# Patient Record
Sex: Female | Born: 1945 | Race: Black or African American | Hispanic: No | Smoking: Former smoker
Health system: Southern US, Community
[De-identification: ages and names within clinical notes are randomized; demographics above are authoritative.]

## PROBLEM LIST (undated history)

## (undated) DIAGNOSIS — I10 Essential (primary) hypertension: Secondary | ICD-10-CM

## (undated) DIAGNOSIS — E538 Deficiency of other specified B group vitamins: Secondary | ICD-10-CM

## (undated) DIAGNOSIS — E785 Hyperlipidemia, unspecified: Secondary | ICD-10-CM

## (undated) DIAGNOSIS — G709 Myoneural disorder, unspecified: Secondary | ICD-10-CM

## (undated) DIAGNOSIS — T7840XA Allergy, unspecified, initial encounter: Secondary | ICD-10-CM

## (undated) DIAGNOSIS — M1990 Unspecified osteoarthritis, unspecified site: Secondary | ICD-10-CM

## (undated) HISTORY — DX: Unspecified osteoarthritis, unspecified site: M19.90

## (undated) HISTORY — DX: Myoneural disorder, unspecified: G70.9

## (undated) HISTORY — DX: Allergy, unspecified, initial encounter: T78.40XA

## (undated) HISTORY — DX: Hyperlipidemia, unspecified: E78.5

## (undated) HISTORY — DX: Deficiency of other specified B group vitamins: E53.8

---

## 2002-07-12 ENCOUNTER — Emergency Department (HOSPITAL_COMMUNITY): Admission: EM | Admit: 2002-07-12 | Discharge: 2002-07-12 | Payer: Self-pay | Admitting: Emergency Medicine

## 2002-07-12 ENCOUNTER — Encounter: Payer: Self-pay | Admitting: Emergency Medicine

## 2004-11-04 ENCOUNTER — Ambulatory Visit: Payer: Self-pay | Admitting: Internal Medicine

## 2004-11-27 ENCOUNTER — Emergency Department: Payer: Self-pay | Admitting: Emergency Medicine

## 2004-12-23 ENCOUNTER — Ambulatory Visit: Payer: Self-pay | Admitting: Internal Medicine

## 2005-01-16 ENCOUNTER — Ambulatory Visit: Payer: Self-pay | Admitting: Internal Medicine

## 2005-02-23 ENCOUNTER — Ambulatory Visit: Payer: Self-pay | Admitting: Internal Medicine

## 2005-08-01 ENCOUNTER — Other Ambulatory Visit: Admission: RE | Admit: 2005-08-01 | Discharge: 2005-08-01 | Payer: Self-pay | Admitting: Internal Medicine

## 2005-08-01 ENCOUNTER — Ambulatory Visit: Payer: Self-pay | Admitting: Internal Medicine

## 2005-08-21 ENCOUNTER — Ambulatory Visit: Payer: Self-pay | Admitting: Internal Medicine

## 2005-09-19 ENCOUNTER — Ambulatory Visit: Payer: Self-pay | Admitting: Internal Medicine

## 2006-01-17 ENCOUNTER — Ambulatory Visit: Payer: Self-pay | Admitting: Family Medicine

## 2006-02-01 ENCOUNTER — Ambulatory Visit: Payer: Self-pay | Admitting: Internal Medicine

## 2006-05-30 ENCOUNTER — Ambulatory Visit: Payer: Self-pay | Admitting: Internal Medicine

## 2006-08-08 ENCOUNTER — Ambulatory Visit: Payer: Self-pay | Admitting: Family Medicine

## 2006-08-31 ENCOUNTER — Other Ambulatory Visit: Admission: RE | Admit: 2006-08-31 | Discharge: 2006-08-31 | Payer: Self-pay | Admitting: Internal Medicine

## 2006-08-31 ENCOUNTER — Ambulatory Visit: Payer: Self-pay | Admitting: Internal Medicine

## 2006-08-31 LAB — CONVERTED CEMR LAB: Pap Smear: NORMAL

## 2006-10-02 ENCOUNTER — Ambulatory Visit: Payer: Self-pay | Admitting: Internal Medicine

## 2006-12-21 ENCOUNTER — Encounter: Payer: Self-pay | Admitting: Internal Medicine

## 2006-12-21 ENCOUNTER — Ambulatory Visit: Payer: Self-pay | Admitting: Gastroenterology

## 2007-03-01 ENCOUNTER — Ambulatory Visit: Payer: Self-pay | Admitting: Internal Medicine

## 2007-03-01 LAB — CONVERTED CEMR LAB
Albumin: 4.2 g/dL (ref 3.5–5.2)
BUN: 18 mg/dL (ref 6–23)
CO2: 26 meq/L (ref 19–32)
Calcium: 9.7 mg/dL (ref 8.4–10.5)
Chloride: 106 meq/L (ref 96–112)
Creatinine, Ser: 0.81 mg/dL (ref 0.40–1.20)
Glucose, Bld: 114 mg/dL — ABNORMAL HIGH (ref 70–99)
Hgb A1c MFr Bld: 7.7 % — ABNORMAL HIGH (ref 4.6–6.1)
Phosphorus: 4 mg/dL (ref 2.3–4.6)
Potassium: 4.1 meq/L (ref 3.5–5.3)
Sodium: 140 meq/L (ref 135–145)

## 2007-07-25 ENCOUNTER — Encounter: Payer: Self-pay | Admitting: Internal Medicine

## 2007-08-22 ENCOUNTER — Encounter: Payer: Self-pay | Admitting: Internal Medicine

## 2007-08-22 DIAGNOSIS — E1140 Type 2 diabetes mellitus with diabetic neuropathy, unspecified: Secondary | ICD-10-CM | POA: Insufficient documentation

## 2007-08-22 DIAGNOSIS — J45909 Unspecified asthma, uncomplicated: Secondary | ICD-10-CM

## 2007-08-22 DIAGNOSIS — J309 Allergic rhinitis, unspecified: Secondary | ICD-10-CM | POA: Insufficient documentation

## 2007-09-04 ENCOUNTER — Ambulatory Visit: Payer: Self-pay | Admitting: Internal Medicine

## 2007-09-05 LAB — CONVERTED CEMR LAB
Albumin: 3.6 g/dL (ref 3.5–5.2)
BUN: 14 mg/dL (ref 6–23)
CO2: 29 meq/L (ref 19–32)
Calcium: 9.4 mg/dL (ref 8.4–10.5)
Chloride: 109 meq/L (ref 96–112)
Cholesterol: 170 mg/dL (ref 0–200)
Creatinine, Ser: 0.7 mg/dL (ref 0.4–1.2)
Creatinine,U: 110.2 mg/dL
GFR calc Af Amer: 110 mL/min
GFR calc non Af Amer: 91 mL/min
Glucose, Bld: 114 mg/dL — ABNORMAL HIGH (ref 70–99)
HDL: 45.5 mg/dL (ref >39.0)
Hgb A1c MFr Bld: 6.4 % — ABNORMAL HIGH (ref 4.6–6.0)
LDL Cholesterol: 112 mg/dL — ABNORMAL HIGH (ref 0–99)
Microalb Creat Ratio: 5.4 mg/g (ref 0.0–30.0)
Microalb, Ur: 0.6 mg/dL (ref 0.0–1.9)
Phosphorus: 3.6 mg/dL (ref 2.3–4.6)
Potassium: 4.1 meq/L (ref 3.5–5.1)
Sodium: 143 meq/L (ref 135–145)
Total CHOL/HDL Ratio: 3.7
Triglycerides: 64 mg/dL (ref 0–149)
VLDL: 13 mg/dL (ref 0–40)

## 2007-10-09 ENCOUNTER — Encounter: Payer: Self-pay | Admitting: Internal Medicine

## 2007-10-09 ENCOUNTER — Ambulatory Visit: Payer: Self-pay | Admitting: Internal Medicine

## 2007-10-10 ENCOUNTER — Ambulatory Visit: Payer: Self-pay | Admitting: Internal Medicine

## 2007-10-10 DIAGNOSIS — J069 Acute upper respiratory infection, unspecified: Secondary | ICD-10-CM | POA: Insufficient documentation

## 2007-10-11 ENCOUNTER — Telehealth (INDEPENDENT_AMBULATORY_CARE_PROVIDER_SITE_OTHER): Payer: Self-pay | Admitting: *Deleted

## 2007-10-14 ENCOUNTER — Encounter (INDEPENDENT_AMBULATORY_CARE_PROVIDER_SITE_OTHER): Payer: Self-pay | Admitting: *Deleted

## 2007-10-17 ENCOUNTER — Emergency Department: Payer: Self-pay | Admitting: Emergency Medicine

## 2007-10-17 ENCOUNTER — Telehealth (INDEPENDENT_AMBULATORY_CARE_PROVIDER_SITE_OTHER): Payer: Self-pay | Admitting: *Deleted

## 2007-10-25 ENCOUNTER — Telehealth (INDEPENDENT_AMBULATORY_CARE_PROVIDER_SITE_OTHER): Payer: Self-pay | Admitting: *Deleted

## 2007-10-28 ENCOUNTER — Ambulatory Visit: Payer: Self-pay | Admitting: Internal Medicine

## 2007-10-28 DIAGNOSIS — G500 Trigeminal neuralgia: Secondary | ICD-10-CM | POA: Insufficient documentation

## 2007-12-09 ENCOUNTER — Telehealth (INDEPENDENT_AMBULATORY_CARE_PROVIDER_SITE_OTHER): Payer: Self-pay | Admitting: *Deleted

## 2008-02-19 ENCOUNTER — Ambulatory Visit: Payer: Self-pay | Admitting: Family Medicine

## 2008-02-21 ENCOUNTER — Telehealth: Payer: Self-pay | Admitting: Family Medicine

## 2008-02-25 ENCOUNTER — Ambulatory Visit: Payer: Self-pay | Admitting: Family Medicine

## 2008-02-25 DIAGNOSIS — H538 Other visual disturbances: Secondary | ICD-10-CM

## 2008-02-28 ENCOUNTER — Other Ambulatory Visit: Payer: Self-pay

## 2008-02-28 ENCOUNTER — Emergency Department: Payer: Self-pay | Admitting: Emergency Medicine

## 2008-03-04 ENCOUNTER — Ambulatory Visit: Payer: Self-pay | Admitting: Internal Medicine

## 2008-03-04 DIAGNOSIS — M159 Polyosteoarthritis, unspecified: Secondary | ICD-10-CM

## 2008-03-05 LAB — CONVERTED CEMR LAB
ALT: 18 units/L (ref 0–35)
AST: 18 units/L (ref 0–37)
Albumin: 3.9 g/dL (ref 3.5–5.2)
Alkaline Phosphatase: 70 units/L (ref 39–117)
BUN: 15 mg/dL (ref 6–23)
Basophils Absolute: 0.1 10*3/uL (ref 0.0–0.1)
Basophils Relative: 1.4 % — ABNORMAL HIGH (ref 0.0–1.0)
Bilirubin, Direct: 0.1 mg/dL (ref 0.0–0.3)
CO2: 30 meq/L (ref 19–32)
Calcium: 9.6 mg/dL (ref 8.4–10.5)
Chloride: 109 meq/L (ref 96–112)
Creatinine, Ser: 0.8 mg/dL (ref 0.4–1.2)
Eosinophils Absolute: 0.6 10*3/uL (ref 0.0–0.6)
Eosinophils Relative: 10.3 % — ABNORMAL HIGH (ref 0.0–5.0)
GFR calc Af Amer: 94 mL/min
GFR calc non Af Amer: 78 mL/min
Glucose, Bld: 115 mg/dL — ABNORMAL HIGH (ref 70–99)
HCT: 39.1 % (ref 36.0–46.0)
Hemoglobin: 12.5 g/dL (ref 12.0–15.0)
Hgb A1c MFr Bld: 6.5 % — ABNORMAL HIGH (ref 4.6–6.0)
Lymphocytes Relative: 30 % (ref 12.0–46.0)
MCHC: 32.1 g/dL (ref 30.0–36.0)
MCV: 85.1 fL (ref 78.0–100.0)
Monocytes Absolute: 0.3 10*3/uL (ref 0.2–0.7)
Monocytes Relative: 4.8 % (ref 3.0–11.0)
Neutro Abs: 3.4 10*3/uL (ref 1.4–7.7)
Neutrophils Relative %: 53.5 % (ref 43.0–77.0)
Phosphorus: 3.7 mg/dL (ref 2.3–4.6)
Platelets: 369 10*3/uL (ref 150–400)
Potassium: 4.5 meq/L (ref 3.5–5.1)
RBC: 4.6 M/uL (ref 3.87–5.11)
RDW: 12.7 % (ref 11.5–14.6)
Sed Rate: 29 mm/hr — ABNORMAL HIGH (ref 0–25)
Sodium: 143 meq/L (ref 135–145)
TSH: 1.74 microintl units/mL (ref 0.35–5.50)
Total Bilirubin: 0.7 mg/dL (ref 0.3–1.2)
Total Protein: 7.4 g/dL (ref 6.0–8.3)
WBC: 6.3 10*3/uL (ref 4.5–10.5)

## 2008-03-12 ENCOUNTER — Telehealth (INDEPENDENT_AMBULATORY_CARE_PROVIDER_SITE_OTHER): Payer: Self-pay | Admitting: *Deleted

## 2008-03-14 ENCOUNTER — Emergency Department (HOSPITAL_COMMUNITY): Admission: EM | Admit: 2008-03-14 | Discharge: 2008-03-14 | Payer: Self-pay | Admitting: Emergency Medicine

## 2008-03-23 ENCOUNTER — Telehealth (INDEPENDENT_AMBULATORY_CARE_PROVIDER_SITE_OTHER): Payer: Self-pay | Admitting: *Deleted

## 2008-06-23 ENCOUNTER — Telehealth (INDEPENDENT_AMBULATORY_CARE_PROVIDER_SITE_OTHER): Payer: Self-pay | Admitting: *Deleted

## 2008-07-22 ENCOUNTER — Telehealth: Payer: Self-pay | Admitting: Internal Medicine

## 2008-07-24 ENCOUNTER — Ambulatory Visit: Payer: Self-pay | Admitting: Internal Medicine

## 2008-07-27 LAB — CONVERTED CEMR LAB
Albumin: 3.6 g/dL (ref 3.5–5.2)
BUN: 17 mg/dL (ref 6–23)
CO2: 28 meq/L (ref 19–32)
Calcium: 9.3 mg/dL (ref 8.4–10.5)
Chloride: 106 meq/L (ref 96–112)
Creatinine, Ser: 0.8 mg/dL (ref 0.4–1.2)
Creatinine,U: 134.5 mg/dL
GFR calc Af Amer: 94 mL/min
GFR calc non Af Amer: 78 mL/min
Glucose, Bld: 111 mg/dL — ABNORMAL HIGH (ref 70–99)
Hgb A1c MFr Bld: 6.5 % — ABNORMAL HIGH (ref 4.6–6.0)
Microalb Creat Ratio: 8.9 mg/g (ref 0.0–30.0)
Microalb, Ur: 1.2 mg/dL (ref 0.0–1.9)
Phosphorus: 3.9 mg/dL (ref 2.3–4.6)
Potassium: 4 meq/L (ref 3.5–5.1)
Sodium: 139 meq/L (ref 135–145)

## 2008-07-30 ENCOUNTER — Telehealth (INDEPENDENT_AMBULATORY_CARE_PROVIDER_SITE_OTHER): Payer: Self-pay | Admitting: *Deleted

## 2008-09-01 ENCOUNTER — Ambulatory Visit: Payer: Self-pay | Admitting: Internal Medicine

## 2008-10-12 ENCOUNTER — Encounter: Payer: Self-pay | Admitting: Internal Medicine

## 2008-10-12 ENCOUNTER — Ambulatory Visit: Payer: Self-pay | Admitting: Internal Medicine

## 2008-10-15 ENCOUNTER — Encounter (INDEPENDENT_AMBULATORY_CARE_PROVIDER_SITE_OTHER): Payer: Self-pay | Admitting: *Deleted

## 2008-10-28 ENCOUNTER — Telehealth: Payer: Self-pay | Admitting: Internal Medicine

## 2008-10-30 ENCOUNTER — Telehealth: Payer: Self-pay | Admitting: Internal Medicine

## 2008-12-04 ENCOUNTER — Telehealth: Payer: Self-pay | Admitting: Internal Medicine

## 2009-02-23 ENCOUNTER — Telehealth: Payer: Self-pay | Admitting: Family Medicine

## 2009-02-23 ENCOUNTER — Ambulatory Visit: Payer: Self-pay | Admitting: Internal Medicine

## 2009-02-24 LAB — CONVERTED CEMR LAB
AST: 15 units/L (ref 0–37)
Albumin: 3.8 g/dL (ref 3.5–5.2)
Alkaline Phosphatase: 60 units/L (ref 39–117)
BUN: 15 mg/dL (ref 6–23)
Bilirubin, Direct: 0.1 mg/dL (ref 0.0–0.3)
Calcium: 9.3 mg/dL (ref 8.4–10.5)
Chloride: 106 meq/L (ref 96–112)
Cholesterol: 170 mg/dL (ref 0–200)
Creatinine, Ser: 0.8 mg/dL (ref 0.4–1.2)
GFR calc Af Amer: 94 mL/min
LDL Cholesterol: 114 mg/dL — ABNORMAL HIGH (ref 0–99)
Total Protein: 7.1 g/dL (ref 6.0–8.3)
Triglycerides: 62 mg/dL (ref 0–149)

## 2009-03-08 ENCOUNTER — Telehealth: Payer: Self-pay | Admitting: Internal Medicine

## 2009-03-11 ENCOUNTER — Encounter: Payer: Self-pay | Admitting: Internal Medicine

## 2009-05-21 ENCOUNTER — Telehealth: Payer: Self-pay | Admitting: Family Medicine

## 2009-06-02 ENCOUNTER — Ambulatory Visit: Payer: Self-pay | Admitting: Family Medicine

## 2009-06-08 ENCOUNTER — Emergency Department: Payer: Self-pay | Admitting: Emergency Medicine

## 2009-06-30 ENCOUNTER — Telehealth (INDEPENDENT_AMBULATORY_CARE_PROVIDER_SITE_OTHER): Payer: Self-pay | Admitting: *Deleted

## 2009-07-06 ENCOUNTER — Ambulatory Visit: Payer: Self-pay | Admitting: Family Medicine

## 2009-07-06 DIAGNOSIS — R5383 Other fatigue: Secondary | ICD-10-CM

## 2009-07-06 DIAGNOSIS — R5381 Other malaise: Secondary | ICD-10-CM

## 2009-07-06 DIAGNOSIS — M542 Cervicalgia: Secondary | ICD-10-CM | POA: Insufficient documentation

## 2009-07-08 LAB — CONVERTED CEMR LAB
Basophils Relative: 0.8 % (ref 0.0–3.0)
Eosinophils Absolute: 0.6 10*3/uL (ref 0.0–0.7)
Eosinophils Relative: 6.7 % — ABNORMAL HIGH (ref 0.0–5.0)
HCT: 35.7 % — ABNORMAL LOW (ref 36.0–46.0)
Lymphs Abs: 2.2 10*3/uL (ref 0.7–4.0)
MCHC: 33.1 g/dL (ref 30.0–36.0)
MCV: 85.5 fL (ref 78.0–100.0)
Monocytes Absolute: 0.1 10*3/uL (ref 0.1–1.0)
Platelets: 317 10*3/uL (ref 150.0–400.0)
RBC: 4.17 M/uL (ref 3.87–5.11)
WBC: 8.3 10*3/uL (ref 4.5–10.5)

## 2009-07-14 ENCOUNTER — Telehealth: Payer: Self-pay | Admitting: Family Medicine

## 2009-07-23 ENCOUNTER — Emergency Department: Payer: Self-pay | Admitting: Emergency Medicine

## 2009-09-08 ENCOUNTER — Telehealth: Payer: Self-pay | Admitting: Internal Medicine

## 2009-09-08 ENCOUNTER — Ambulatory Visit: Payer: Self-pay | Admitting: Internal Medicine

## 2009-09-08 ENCOUNTER — Encounter: Payer: Self-pay | Admitting: Internal Medicine

## 2009-09-08 ENCOUNTER — Other Ambulatory Visit: Admission: RE | Admit: 2009-09-08 | Discharge: 2009-09-08 | Payer: Self-pay | Admitting: Internal Medicine

## 2009-09-08 DIAGNOSIS — G479 Sleep disorder, unspecified: Secondary | ICD-10-CM | POA: Insufficient documentation

## 2009-09-10 ENCOUNTER — Encounter: Payer: Self-pay | Admitting: Internal Medicine

## 2009-09-10 LAB — CONVERTED CEMR LAB
ALT: 12 units/L (ref 0–35)
AST: 15 units/L (ref 0–37)
Albumin: 3.6 g/dL (ref 3.5–5.2)
Basophils Absolute: 0.1 10*3/uL (ref 0.0–0.1)
Basophils Relative: 1 % (ref 0.0–3.0)
Eosinophils Relative: 6.1 % — ABNORMAL HIGH (ref 0.0–5.0)
Glucose, Bld: 146 mg/dL — ABNORMAL HIGH (ref 70–99)
HDL: 43.8 mg/dL (ref >39.00)
Hemoglobin: 12 g/dL (ref 12.0–15.0)
Hgb A1c MFr Bld: 6.4 % (ref 4.6–6.5)
Lymphocytes Relative: 29.1 % (ref 12.0–46.0)
Microalb Creat Ratio: 1.9 mg/g (ref 0.0–30.0)
Monocytes Relative: 3.4 % (ref 3.0–12.0)
Neutro Abs: 3.9 10*3/uL (ref 1.4–7.7)
Phosphorus: 3.9 mg/dL (ref 2.3–4.6)
RBC: 4.27 M/uL (ref 3.87–5.11)
RDW: 13 % (ref 11.5–14.6)
Sodium: 142 meq/L (ref 135–145)
Total Protein: 7.2 g/dL (ref 6.0–8.3)
Triglycerides: 79 mg/dL (ref 0.0–149.0)
WBC: 6.5 10*3/uL (ref 4.5–10.5)

## 2009-09-13 ENCOUNTER — Telehealth: Payer: Self-pay | Admitting: Internal Medicine

## 2009-09-24 ENCOUNTER — Telehealth: Payer: Self-pay | Admitting: Internal Medicine

## 2009-10-07 ENCOUNTER — Telehealth: Payer: Self-pay | Admitting: Internal Medicine

## 2009-10-13 ENCOUNTER — Encounter: Payer: Self-pay | Admitting: Internal Medicine

## 2009-10-13 ENCOUNTER — Ambulatory Visit: Payer: Self-pay | Admitting: Internal Medicine

## 2009-10-13 LAB — HM MAMMOGRAPHY: HM Mammogram: NORMAL

## 2009-10-14 ENCOUNTER — Telehealth: Payer: Self-pay | Admitting: Internal Medicine

## 2009-10-19 ENCOUNTER — Encounter (INDEPENDENT_AMBULATORY_CARE_PROVIDER_SITE_OTHER): Payer: Self-pay | Admitting: *Deleted

## 2009-12-13 ENCOUNTER — Telehealth: Payer: Self-pay | Admitting: Internal Medicine

## 2010-03-07 ENCOUNTER — Ambulatory Visit: Payer: Self-pay | Admitting: Internal Medicine

## 2010-04-05 ENCOUNTER — Encounter: Payer: Self-pay | Admitting: Internal Medicine

## 2010-05-18 ENCOUNTER — Telehealth: Payer: Self-pay | Admitting: Internal Medicine

## 2010-05-19 ENCOUNTER — Telehealth: Payer: Self-pay | Admitting: Internal Medicine

## 2010-05-19 DIAGNOSIS — H9319 Tinnitus, unspecified ear: Secondary | ICD-10-CM | POA: Insufficient documentation

## 2010-05-26 ENCOUNTER — Encounter: Payer: Self-pay | Admitting: Internal Medicine

## 2010-06-14 ENCOUNTER — Telehealth: Payer: Self-pay | Admitting: Internal Medicine

## 2010-09-06 ENCOUNTER — Telehealth: Payer: Self-pay | Admitting: Internal Medicine

## 2010-09-13 ENCOUNTER — Ambulatory Visit: Payer: Self-pay | Admitting: Internal Medicine

## 2010-09-14 LAB — CONVERTED CEMR LAB
AST: 19 units/L (ref 0–37)
Albumin: 3.8 g/dL (ref 3.5–5.2)
Alkaline Phosphatase: 59 units/L (ref 39–117)
Basophils Relative: 0.8 % (ref 0.0–3.0)
Chloride: 102 meq/L (ref 96–112)
Cholesterol: 183 mg/dL (ref 0–200)
Eosinophils Relative: 9.7 % — ABNORMAL HIGH (ref 0.0–5.0)
GFR calc non Af Amer: 99.01 mL/min (ref >60)
Hemoglobin: 12.2 g/dL (ref 12.0–15.0)
Hgb A1c MFr Bld: 6.9 % — ABNORMAL HIGH (ref 4.6–6.5)
LDL Cholesterol: 111 mg/dL — ABNORMAL HIGH (ref 0–99)
Lymphocytes Relative: 27.7 % (ref 12.0–46.0)
Microalb Creat Ratio: 0.4 mg/g (ref 0.0–30.0)
Monocytes Relative: 5 % (ref 3.0–12.0)
Neutro Abs: 4.1 10*3/uL (ref 1.4–7.7)
Phosphorus: 3 mg/dL (ref 2.3–4.6)
Potassium: 4 meq/L (ref 3.5–5.1)
RBC: 4.26 M/uL (ref 3.87–5.11)
TSH: 2.34 microintl units/mL (ref 0.35–5.50)
Total Protein: 6.8 g/dL (ref 6.0–8.3)
Triglycerides: 132 mg/dL (ref 0.0–149.0)

## 2010-09-30 ENCOUNTER — Telehealth (INDEPENDENT_AMBULATORY_CARE_PROVIDER_SITE_OTHER): Payer: Self-pay | Admitting: *Deleted

## 2010-10-18 ENCOUNTER — Ambulatory Visit: Payer: Self-pay | Admitting: Internal Medicine

## 2010-10-31 ENCOUNTER — Telehealth: Payer: Self-pay | Admitting: Internal Medicine

## 2010-12-08 ENCOUNTER — Telehealth: Payer: Self-pay | Admitting: Internal Medicine

## 2011-01-24 ENCOUNTER — Telehealth: Payer: Self-pay | Admitting: Internal Medicine

## 2011-01-26 ENCOUNTER — Telehealth: Payer: Self-pay | Admitting: Internal Medicine

## 2011-01-26 NOTE — Consult Note (Signed)
Summary: [facility]   Imported By: Lanelle Bal 04/13/2010 11:37:14  _____________________________________________________________________  External Attachment:    Type:   Image     Comment:   External Document  Appended Document: [facility]    Clinical Lists Changes  Observations: Added new observation of DIAB EYE EX: No diabetic retinopathy.    (04/05/2010 20:10)       Diabetic Eye Exam  Procedure date:  04/05/2010  Findings:      No diabetic retinopathy.

## 2011-01-26 NOTE — Progress Notes (Signed)
Summary: wants increased dose of tamazepam  Phone Note Refill Request Call back at Home Phone [phone] Message from:  Patient  Refills Requested: Medication #1:  TEMAZEPAM 30 MG CAPS Take 1 tablet by mouth at bedtime as needed for insomnia Pt is taking one at bedtime and she is asking if the dose can be increased, she is waking up during the night.  She thinks she is waking up due to the buzzing in her ear, this really bothers her at night and she thinks an increased dose will help her.  [facility]ses [facility] [address]  Please let pt know.  Initial call taken by: Lowella Petties CMA, AAMA,  December 08, 2010 8:56 AM  Follow-up for Phone Call        this is the maximum dose I had considered changing this after our discussion at her physical  If she would like to change, cancel the temazepam and change to clonazepam 0.5 1-2 tabs at bedtime to help sleep #60 x 2 Follow-up by: Cindee Salt MD,  December 08, 2010 1:08 PM  Additional Follow-up for Phone Call Additional follow up Details #1::        Pt agrees to change.  Please call to [facility] [address]              Lowella Petties CMA, AAMA  December 08, 2010 3:40 PM   rx telephoned to pharmacy Additional Follow-up by: Mervin Hack CMA Duncan Dull),  December 08, 2010 3:48 PM    New/Updated Medications: CLONAZEPAM 0.5 MG TABS (CLONAZEPAM) 1-2 tabs at bedtime to help sleep Prescriptions: CLONAZEPAM 0.5 MG TABS (CLONAZEPAM) 1-2 tabs at bedtime to help sleep  #60 x 2   Entered by:   Mervin Hack CMA (AAMA)   Authorized by:   Cindee Salt MD   Signed by:   Mervin Hack CMA (AAMA) on 12/08/2010   Method used:   Telephoned to ...       [facility] [address] [phone]* (retail)       [address] [place], [place]  [address]       Ph: [phone]       Fax: [phone]   RxID:   [id number]

## 2011-01-26 NOTE — Assessment & Plan Note (Signed)
Summary: 6 MONTH FOLLOW UP/[facility]   Vital Signs:  Patient profile:   65 year old female Weight:      197 pounds Temp:     98.3 degrees F oral Pulse rate:   76 / minute Pulse rhythm:   regular BP sitting:   120 / 80  (left arm) Cuff size:   regular  Vitals Entered By: Mervin Hack CMA Duncan Dull) (March 07, 2010 4:20 PM) CC: 6 month follow-up   History of Present Illness: Doing fairly well  Still on the qvar Can't afford it --only on it due to samples No cough or SOB Hasn't needed albuterol no night cough  Exercises twice a week on bicycle Stable exercise tolerance No chest pain No edema  checks sugars every day--discussed it would be fine to cut down some 115-130 fasting  Allergies: No Known Drug Allergies  Past History:  Past medical, surgical, family and social histories (including risk factors) reviewed for relevance to current acute and chronic problems.  Past Medical History: Reviewed history from 03/04/2008 and no changes required. Allergic rhinitis Asthma Diabetes mellitus, type II Hyperlipidemia Osteoarthritis  Past Surgical History: Reviewed history from 09/04/2007 and no changes required. Denies surgical history  Family History: Reviewed history from 09/01/2008 and no changes required. DM strong in family Mom died of MI @87  Dad died of lung cancer @58  1 sister, 2 brothers HTN is strong in family also No breast or colon cancer  Social History: Reviewed history from 10/10/2007 and no changes required. 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   Neck:  supple, no masses, no thyromegaly, no carotid bruits, and no cervical  lymphadenopathy.   Lungs:  normal respiratory effort and normal breath sounds.   Heart:  normal rate, regular rhythm, no murmur, and no gallop.   Msk:  no joint tenderness and no joint swelling.   Pulses:  1+ in feet Extremities:  no edema Skin:  no suspicious lesions and no ulcerations.   Psych:  normally interactive, good eye contact, not anxious appearing, and not depressed appearing.    Diabetes Management Exam:    Foot Exam (with socks and/or shoes not present):       Sensory-Pinprick/Light touch:          Left medial foot (L-4): normal          Left dorsal foot (L-5): normal          Left lateral foot (S-1): normal          Right medial foot (L-4): normal          Right dorsal foot (L-5): normal          Right lateral foot (S-1): normal       Inspection:          Left foot: abnormal             Comments: slight plantar callous          Right foot: normal       Nails:          Left foot: normal          Right foot: normal   Impression & Recommendations:  Problem # 1:  ASTHMA (ICD-493.90) Assessment Unchanged doing well on only 1 puff of qvar to make it last gave samples of flovent 44 (only thing we have)  Her updated medication list for this problem includes:    Proventil Hfa 108 (90 Base) Mcg/act Aers (Albuterol sulfate) ..... Inhale 2 puff as directed four times a day prn    Qvar 80 Mcg/act Aers (Beclomethasone dipropionate) ..... Inhale 2 puff as directed once a day  Problem # 2:  DIABETES MELLITUS, TYPE II (ICD-250.00) Assessment: Unchanged  seems to still have good control will check A1c  The following medications were removed from the medication list:    Bayer Aspirin Ec Low Dose 81 Mg Tbec (Aspirin) .Marland Kitchen... 1 daily by mouth Her updated medication list for this problem includes:    Metformin Hcl 500 Mg Tabs (Metformin hcl) .Marland Kitchen... 1 two times a day  Labs Reviewed: Creat: 0.7 (09/08/2009)     Last Eye Exam: No diabetic retinopathy.    (03/11/2009) Reviewed  HgBA1c results: 6.4 (09/08/2009)  6.3 (02/23/2009)  Orders: Venipuncture (16109) TLB-A1C / Hgb A1C (Glycohemoglobin) (83036-A1C)  Problem # 3:  HYPERLIPIDEMIA (MILD) (ICD-272.4) Assessment: Unchanged discussed and I asked her to stay on her fish oil  Labs Reviewed: SGOT: 15 (09/08/2009)   SGPT: 12 (09/08/2009)   HDL:43.80 (09/08/2009), 44.0 (02/23/2009)  LDL:97 (09/08/2009), 114 (60/45/4098)  Chol:157 (09/08/2009), 170 (02/23/2009)  Trig:79.0 (09/08/2009), 62 (02/23/2009)  Problem # 4:  SLEEP DISORDER (ICD-780.50) Assessment: Unchanged uses meds occ  Complete Medication List: 1)  Diclofenac Sodium 50 Mg Tbec (Diclofenac sodium) .... Take 1 tablet by mouth twice a day as needed for arthritis pain 2)  Temazepam 30 Mg Caps (Temazepam) .... Take 1 tablet by mouth at bedtime as needed for insomnia 3)  Proventil Hfa 108 (90 Base) Mcg/act Aers (Albuterol sulfate) .... Inhale 2 puff as directed four times a day prn 4)  Qvar 80 Mcg/act Aers (Beclomethasone dipropionate) .... Inhale 2 puff as directed once a day 5)  Precision Pcx Plus Test Strp (Glucose blood) .... Check as directed for blood sugars 6)  Metformin Hcl 500 Mg Tabs (Metformin hcl) .Marland Kitchen.. 1 two times a day 7)  Tramadol Hcl 50 Mg Tabs (Tramadol hcl) .Marland Kitchen.. 1 by mouth 4 times daily as needed pain 8)  Vitamin D 1000 Unit Tabs (Cholecalciferol) .Marland Kitchen.. 1 tab daily  Patient Instructions: 1)  Please schedule a follow-up appointment in 6 months for physical  Current Allergies (reviewed today): No known allergies

## 2011-01-26 NOTE — Progress Notes (Signed)
Summary: regarding metformin  Phone Note Call from Patient   Caller: Patient Call For: Cindee Salt MD Summary of Call: Pt called and asked if she can take her sister's metformin.  Pt takes the plain metformin and her sister takes the extended release. Advised pt these are different, advised pt not to take her sister's medicine. Initial call taken by: Lowella Petties CMA,  June 14, 2010 9:02 AM  Follow-up for Phone Call        If she doesn't want it to go to waste, okay to use her sister's medicine should do about the same job Follow-up by: Cindee Salt MD,  June 14, 2010 10:44 AM  Additional Follow-up for Phone Call Additional follow up Details #1::        Should she take one or two a day?  Lowella Petties CMA  June 14, 2010 10:48 AM  She should continue to take 1000mg  daily If the extended release, it can all be taken before breakfast Cindee Salt MD  June 14, 2010 4:14 PM      Additional Follow-up for Phone Call Additional follow up Details #2::    Advised pt                Lowella Petties CMA  June 14, 2010 5:02 PM

## 2011-01-26 NOTE — Progress Notes (Signed)
Summary: wants to increase temazepam  Phone Note Call from Patient Call back at Home Phone [phone]   Caller: Patient Call For: Cindee Salt MD Summary of Call: Pt takes temazepam 30 mg's  to help her sleep at night and she is asking if she can start taking 2, so that she can rest better. Initial call taken by: Lowella Petties CMA,  September 06, 2010 9:25 AM  Follow-up for Phone Call        No 30mg  should be the maximum dose we will have to consider something else She can try OTC melatonin as well but if we need to change this, we should discuss this in the office Follow-up by: Cindee Salt MD,  September 06, 2010 1:19 PM  Additional Follow-up for Phone Call Additional follow up Details #1::        Spoke with patient and advised results.  Additional Follow-up by: Mervin Hack CMA Duncan Dull),  September 06, 2010 2:30 PM

## 2011-01-26 NOTE — Progress Notes (Signed)
Summary: TEMAZEPAM  Phone Note Refill Request Message from:  [facility] [phone] on May 18, 2010 4:32 PM  Refills Requested: Medication #1:  TEMAZEPAM 30 MG CAPS Take 1 tablet by mouth at bedtime as needed for insomnia   Last Refilled: 03/04/2010 E-Scribe Request    Method Requested: Telephone to Pharmacy Initial call taken by: Mervin Hack CMA Duncan Dull),  May 18, 2010 4:32 PM  Follow-up for Phone Call        okay #30 x 1 Follow-up by: Cindee Salt MD,  May 19, 2010 1:08 PM  Additional Follow-up for Phone Call Additional follow up Details #1::        Rx called to pharmacy Additional Follow-up by: Mervin Hack CMA Duncan Dull),  May 19, 2010 2:48 PM    Prescriptions: TEMAZEPAM 30 MG CAPS (TEMAZEPAM) Take 1 tablet by mouth at bedtime as needed for insomnia  #30 x 1   Entered by:   Mervin Hack CMA (AAMA)   Authorized by:   Cindee Salt MD   Signed by:   Mervin Hack CMA (AAMA) on 05/19/2010   Method used:   Telephoned to ...       [facility] [address] [phone]* (retail)       [address] [place], [place]  [address]       Ph: [phone]       Fax: [phone]   RxID:   [id number]

## 2011-01-26 NOTE — Progress Notes (Signed)
Summary: ? Rx. for ears  Phone Note Call from Patient Call back at Home Phone [phone]   Caller: Patient Call For: Cindee Salt MD Summary of Call: Patient says she is having trouble at night when she tries to go to sleep that "it sounds like crickets in my ears".  Is there anything that she can take OTC or to be phoned in to her pharmacy?  If not, she will make an appointment if necessary.  [facility] [address] Initial call taken by: Delilah Shan CMA Duncan Dull),  May 19, 2010 11:14 AM  Follow-up for Phone Call        sounds like may be problem with auditory nerve Unlikely my exam will find anything, or that a Rx will help  May be best to see ENT if continues Follow-up by: Cindee Salt MD,  May 19, 2010 1:04 PM  Additional Follow-up for Phone Call Additional follow up Details #1::        pt would like referral to ENT in [place] if possible, pt states again that this only happens at night. Additional Follow-up by: Mervin Hack CMA Duncan Dull),  May 19, 2010 3:29 PM  New Problems: TINNITUS (ICD-388.30)   Additional Follow-up for Phone Call Additional follow up Details #2::    referral made Cindee Salt MD  May 19, 2010 3:58 PM   New Problems: TINNITUS (ICD-388.30)

## 2011-01-26 NOTE — Assessment & Plan Note (Signed)
Summary: CPX / LFW   Vital Signs:  Patient profile:   65 year old female Weight:      196 pounds BMI:     30.81 Temp:     98.3 degrees F oral Pulse rate:   68 / minute Pulse rhythm:   regular BP sitting:   128 / 88  (left arm) Cuff size:   regular  Vitals Entered By: Mervin Hack CMA Duncan Dull) (September 13, 2010 9:51 AM) CC: adult physical   History of Present Illness: Generally doing okay does take the temazepam at night--initiates okay but awakens in night. Some trouble getting back to sleep started a couple of months No sig daytime somnolence discussed trying melatonin May be having mild menopausal symptoms again  checks sugars two times a day  discussed cutting down to no more than daily AM is 120-140 Occ up near 200 in random No hypoglycemic reactions  Asthma is quiet does use the qvar but not all the time Rarely uses the proventil  Allergies: No Known Drug Allergies  Past History:  Past medical, surgical, family and social histories (including risk factors) reviewed for relevance to current acute and chronic problems.  Past Medical History: Reviewed history from 03/04/2008 and no changes required. Allergic rhinitis Asthma Diabetes mellitus, type II Hyperlipidemia Osteoarthritis  Past Surgical History: Reviewed history from 09/04/2007 and no changes required. Denies surgical history  Family History: Reviewed history from 09/01/2008 and no changes required. DM strong in family Mom died of MI @87  Dad died of lung cancer @58  1 sister, 2 brothers HTN is strong in family also No breast or colon cancer  Social History: Reviewed history from 10/10/2007 and no changes required. 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 Eyes:  Denies double vision and vision  loss-1 eye. ENT:  Complains of ringing in ears and sinus pressure; denies decreased hearing; saw ENT and got flonase--not using that now. CV:  Denies chest pain or discomfort, difficulty breathing at night, difficulty breathing while lying down, fainting, lightheadness, palpitations, and shortness of breath with exertion. Resp:  Denies cough, shortness of breath, and wheezing. GI:  Denies abdominal pain, bloody stools, change in bowel habits, dark tarry stools, indigestion, nausea, and vomiting. GU:  Denies abnormal vaginal bleeding, dysuria, and incontinence; no sex--no problem. MS:  Complains of joint pain; denies joint swelling; right shoulder pain is chronic---meds help. Derm:  Denies lesion(s) and rash. Neuro:  Denies headaches, numbness, tingling, and weakness. Psych:  Denies anxiety and depression. Heme:  Denies abnormal bruising and enlarge lymph nodes. Allergy:  Denies seasonal allergies and sneezing.  Physical Exam  General:  alert and normal appearance.   Eyes:  pupils equal, pupils round, pupils reactive to light, and no optic disk abnormalities.   Ears:  R ear normal and L ear normal.   Mouth:  no erythema, no exudates, and no lesions.   Neck:  supple, no masses, no thyromegaly, no carotid bruits, and no cervical lymphadenopathy.   Breasts:  no abnormal thickening, no tenderness, and no adenopathy.  Bilateral cystic changes Lungs:  normal respiratory effort, no intercostal retractions, no accessory muscle use, and normal breath sounds.   Heart:  normal rate, regular rhythm, no murmur, and no gallop.   Abdomen:  soft and non-tender.   Msk:  no joint tenderness and no joint  swelling.   Pulses:  1+ in feet Extremities:  no edema Neurologic:  alert & oriented X3, strength normal in all extremities, and gait normal.   Skin:  no rashes, no suspicious lesions, and no ulcerations.   Psych:  normally interactive, good eye contact, not anxious appearing, and not depressed appearing.      Diabetes Management Exam:    Foot Exam (with socks and/or shoes not present):       Sensory-Pinprick/Light touch:          Left medial foot (L-4): normal          Left dorsal foot (L-5): normal          Left lateral foot (S-1): normal          Right medial foot (L-4): normal          Right dorsal foot (L-5): normal          Right lateral foot (S-1): normal       Inspection:          Left foot: normal          Right foot: normal       Nails:          Left foot: normal          Right foot: normal   Impression & Recommendations:  Problem # 1:  PREVENTIVE HEALTH CARE (ICD-V70.0) Assessment Comment Only doing well discussed fitness agress to Tdap but prefers to defer pneumovax and flu due for mammo soon  Problem # 2:  DIABETES MELLITUS, TYPE II (ICD-250.00) Assessment: Unchanged  good control will recheck labs  Her updated medication list for this problem includes:    Metformin Hcl 500 Mg Tabs (Metformin hcl) .Marland Kitchen... 1 two times a day  Labs Reviewed: Creat: 0.7 (09/08/2009)     Last Eye Exam: No diabetic retinopathy.    (04/05/2010) Reviewed HgBA1c results: 6.4 (03/07/2010)  6.4 (09/08/2009)  Orders: TLB-Lipid Panel (80061-LIPID) Venipuncture (16109) TLB-Microalbumin/Creat Ratio, Urine (82043-MALB) TLB-A1C / Hgb A1C (Glycohemoglobin) (83036-A1C) TLB-Renal Function Panel (80069-RENAL) TLB-CBC Platelet - w/Differential (85025-CBCD) TLB-Hepatic/Liver Function Pnl (80076-HEPATIC) TLB-TSH (Thyroid Stimulating Hormone) (84443-TSH)  Problem # 3:  ASTHMA (ICD-493.90) Assessment: Unchanged good control not regular with qvar but few symptoms  Her updated medication list for this problem includes:    Qvar 80 Mcg/act Aers (Beclomethasone dipropionate) ..... Inhale 2 puff as directed once a day    Proventil Hfa 108 (90 Base) Mcg/act Aers (Albuterol sulfate) ..... Inhale 2 puff as directed four times a day prn  Problem # 4:  SLEEP DISORDER (ICD-780.50) Assessment:  Deteriorated will consider change to clonazepam if not improved  Complete Medication List: 1)  Metformin Hcl 500 Mg Tabs (Metformin hcl) .Marland Kitchen.. 1 two times a day 2)  Diclofenac Sodium 50 Mg Tbec (Diclofenac sodium) .... Take 1 tablet by mouth twice a day as needed for arthritis pain 3)  Temazepam 30 Mg Caps (Temazepam) .... Take 1 tablet by mouth at bedtime as needed for insomnia 4)  Qvar 80 Mcg/act Aers (Beclomethasone dipropionate) .... Inhale 2 puff as directed once a day 5)  Proventil Hfa 108 (90 Base) Mcg/act Aers (Albuterol sulfate) .... Inhale 2 puff as directed four times a day prn 6)  Vitamin D 1000 Unit Tabs (Cholecalciferol) .Marland Kitchen.. 1 tab daily 7)  Fish Oil 1000 Mg Caps (Omega-3 fatty acids) .... Once daily 8)  Precision Pcx Plus Test Strp (Glucose blood) .... Check as directed for blood sugars  Other Orders: Radiology  Referral (Radiology) Tdap => 83yrs IM (04540) Admin 1st Vaccine (98119) Admin 1st Vaccine [facility]) [phone])  Patient Instructions: 1)  Please call if your sleep problems don't improve over the next month or so 2)  Please schedule a follow-up appointment in 6 months .  3)  Schedule your mammogram.  Prescriptions: DICLOFENAC SODIUM 50 MG TBEC (DICLOFENAC SODIUM) Take 1 tablet by mouth twice a day as needed for arthritis pain  #60 x 11   Entered and Authorized by:   Cindee Salt MD   Signed by:   Cindee Salt MD on 09/13/2010   Method used:   Electronically to        [facility] [address] [phone]* (retail)       [address] [place], [place]  [address]       Ph: [phone]       Fax: [phone]   RxID:   [id number] METFORMIN HCL 500 MG TABS (METFORMIN HCL) 1 two times a day  #60 Tablet x 12   Entered by:   Mervin Hack CMA (AAMA)   Authorized by:   Cindee Salt MD   Signed by:   Mervin Hack CMA (AAMA) on 09/13/2010   Method used:   Electronically to        [facility] [address] [phone]* (retail)       [address]  [place], [place]  [address]       Ph: [phone]       Fax: [phone]   RxID:   [id number]   Current Allergies (reviewed today): No known allergies    Tetanus/Td Vaccine    Vaccine Type: Tdap    Site: left deltoid    Mfr: GlaxoSmithKline    Dose: 0.5 ml    Route: IM    Given by: Mervin Hack CMA (AAMA)    Exp. Date: 09/14/2012    Lot #: [id number]    VIS given: 11/11/08 version given September 13, 2010.

## 2011-01-26 NOTE — Progress Notes (Signed)
Summary: TEMAZEPAM   Phone Note [facility] [phone] on September 30, 2010 8:46 AM  Refills Requested: Medication #1:  TEMAZEPAM 30 MG CAPS Take 1 tablet by mouth at bedtime as needed for insomnia   Last Refilled: 07/16/2010 E-Scribe Request    Method Requested: Telephone to Pharmacy Initial call taken by: Mervin Hack CMA Duncan Dull),  September 30, 2010 8:46 AM  Follow-up for Phone Call        okay #30 x 1 Follow-up by: Cindee Salt MD,  September 30, 2010 2:03 PM  Additional Follow-up for Phone Call Additional follow up Details #1::        Rx called to pharmacy Additional Follow-up by: Benny Lennert CMA Duncan Dull),  September 30, 2010 2:18 PM    Prescriptions: TEMAZEPAM 30 MG CAPS (TEMAZEPAM) Take 1 tablet by mouth at bedtime as needed for insomnia  #30 x 1   Entered by:   Benny Lennert CMA (AAMA)   Authorized by:   Mervin Hack CMA (AAMA)   Signed by:   Benny Lennert CMA (AAMA) on 09/30/2010   Method used:   Telephoned to ...       [facility] [address] [phone]* (retail)       [address] [place], [place]  [address]       Ph: [phone]       Fax: [phone]   RxID:   [id number]

## 2011-01-26 NOTE — Progress Notes (Signed)
Summary: regarding vitamin c  Phone Note Call from Patient Call back at Home Phone [phone]   Caller: Patient Call For: Cindee Salt MD Summary of Call: Pt is asking if ok for her to take vitamin C, asking how much she should take. Initial call taken by: Lowella Petties CMA, AAMA,  October 31, 2010 9:02 AM  Follow-up for Phone Call        It is okay but I don't generally recommend it since it is another pill and its benefits are unclear if she eats fruit already  If she wishes to take it, I recommend either 500mg  or 1000mg  daily Follow-up by: Cindee Salt MD,  October 31, 2010 10:04 AM  Additional Follow-up for Phone Call Additional follow up Details #1::        Spoke with patient and advised results.  Additional Follow-up by: Mervin Hack CMA Duncan Dull),  October 31, 2010 2:16 PM

## 2011-01-26 NOTE — Consult Note (Signed)
Summary: [facility]  [facility]   Imported By: Lanelle Bal 06/08/2010 13:08:53  _____________________________________________________________________  External Attachment:    Type:   Image     Comment:   External Document  Appended Document: [facility] mild sensorineural hearing loss allergies also--will try fluticasone nasal

## 2011-01-28 ENCOUNTER — Encounter: Payer: Self-pay | Admitting: Internal Medicine

## 2011-01-30 ENCOUNTER — Encounter: Payer: Self-pay | Admitting: Family Medicine

## 2011-01-30 ENCOUNTER — Ambulatory Visit (INDEPENDENT_AMBULATORY_CARE_PROVIDER_SITE_OTHER): Payer: Self-pay | Admitting: Family Medicine

## 2011-01-30 DIAGNOSIS — J069 Acute upper respiratory infection, unspecified: Secondary | ICD-10-CM

## 2011-02-01 NOTE — Progress Notes (Signed)
Summary: regarding clonazepam  Phone Note Call from Patient   Caller: Patient Call For: Cindee Salt MD Summary of Call: Pt called to say that she took one clonazepam last night and it didnt help her sleep, she wants to change to something else.  I advised her that per her last visit note she can try taking 2 at night and see if that helps.  I told her to give this some time.  Advised her to call back in a week if this doesnt help at all. Initial call taken by: Lowella Petties CMA, AAMA,  January 26, 2011 10:51 AM  Follow-up for Phone Call        Please call again As of last phone note, we had changed to trazodone and stopped the clonazepam because it made her feel funny  She needs to give the trazodone a chance to work and call if she gets up to 2 at night for several days with no success Follow-up by: Cindee Salt MD,  January 26, 2011 12:43 PM  Additional Follow-up for Phone Call Additional follow up Details #1::        I'm sorry, pt is taking trazodone, I advised her earlier to give this a chance to work, she said she will take 2 and see if that works better.             Lowella Petties CMA, AAMA  January 26, 2011 3:06 PM   Okay Additional Follow-up by: Cindee Salt MD,  January 26, 2011 3:23 PM

## 2011-02-01 NOTE — Progress Notes (Signed)
Summary: doesn't like clonazepam  Phone Note Call from Patient Call back at Home Phone [phone]   Caller: Patient Call For: Cindee Salt MD Summary of Call: Patient says that she she doesn't like the way she feels when she takes the clonazepam, she says it makes her feel a little bit dizzy, and spaced out. She is asking if she could try something else to help her sleep. [facility]ses [facility]  [address]  Initial call taken by: Melody Comas,  January 24, 2011 9:25 AM  Follow-up for Phone Call        have her try trazodone 50mg   1-2 at bedtime Have her try one for 1-2 days---then increase to 2 if not effective. She may need more, and she needs to give it some time Have her call in a week if the 100mg  isn't helping at all and we can increase further this is safer than the tranquilizers she has been on  #60 x3 Follow-up by: Cindee Salt MD,  January 24, 2011 9:31 AM  Additional Follow-up for Phone Call Additional follow up Details #1::        Rx Called In, Spoke with patient and advised results.  Additional Follow-up by: Mervin Hack CMA (AAMA),  January 24, 2011 9:59 AM    New/Updated Medications: TRAZODONE HCL 50 MG TABS (TRAZODONE HCL) 1-2 at bedtime Prescriptions: TRAZODONE HCL 50 MG TABS (TRAZODONE HCL) 1-2 at bedtime  #60 x 3   Entered by:   Mervin Hack CMA (AAMA)   Authorized by:   Cindee Salt MD   Signed by:   Mervin Hack CMA (AAMA) on 01/24/2011   Method used:   Electronically to        [facility]. [address] [phone]* (retail)       [address] [place], [place]  [address]       Ph: [phone]       Fax: [phone]   RxID:   [id number]

## 2011-02-09 NOTE — Assessment & Plan Note (Signed)
Summary: ?SINUS INFECTION/CLE   [facility]   Vital Signs:  Patient profile:   65 year old female Height:      67 inches Weight:      199.25 pounds BMI:     31.32 Temp:     98.3 degrees F oral Pulse rate:   68 / minute Pulse rhythm:   regular BP sitting:   120 / 86  (right arm) Cuff size:   regular  Vitals Entered By: Linde Gillis CMA Duncan Dull) (January 30, 2011 10:40 AM) CC: sinus infection   History of Present Illness: 65 yo with h/o asthma here for ?sinus infection.  Using her Proair inhaler a few times a day for past few days. dry cough, wheezing, sinus pressure. Taking Loratidine with no relief of symptoms.  No fevers, but did have chills last night. No CP or SOB.  Current Medications (verified): 1)  Metformin Hcl 500 Mg Tabs (Metformin Hcl) .Marland Kitchen.. 1 Two Times A Day 2)  Diclofenac Sodium 50 Mg Tbec (Diclofenac Sodium) .... Take 1 Tablet By Mouth Twice A Day As Needed For Arthritis Pain 3)  Qvar 80 Mcg/act Aers (Beclomethasone Dipropionate) .... Inhale 2 Puff As Directed Once A Day 4)  Proventil Hfa 108 (90 Base) Mcg/act Aers (Albuterol Sulfate) .... Inhale 2 Puff As Directed Four Times A Day Prn 5)  Vitamin D 1000 Unit Tabs (Cholecalciferol) .Marland Kitchen.. 1 Tab Daily 6)  Fish Oil 1000 Mg Caps (Omega-3 Fatty Acids) .... Once Daily 7)  Precision Pcx Plus Test   Strp (Glucose Blood) .... Check As Directed For Blood Sugars 8)  Azithromycin 250 Mg  Tabs (Azithromycin) .... 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 ENT:  Complains of nasal congestion, postnasal drainage, sinus pressure, and sore throat. CV:  Denies chest pain or discomfort. Resp:  Complains of cough and wheezing; denies shortness of breath and sputum productive.  Physical Exam  General:  alert and normal appearance.   VSS Nose:  mild erythema, sinuses +/- TTP Mouth:  +PND Lungs:  normal respiratory effort, no intercostal retractions, no accessory muscle use.  +exp wheezes, L?R, ronchi Heart:  normal rate, regular rhythm, no murmur, and no gallop.   Extremities:  no edema Psych:  normally interactive, good eye contact, not anxious appearing, and not depressed appearing.     Impression & Recommendations:  Problem # 1:  URI (ICD-465.9) Assessment New Given her h/o asthma, with wheezes and rhonchi along with sinus pressure and chills, will treat with Zpack.C Continue inhalers. Good air movement, no oral steroids indicated at this point. Her updated medication list for this problem includes:    Diclofenac Sodium 50 Mg Tbec (Diclofenac sodium) .Marland Kitchen... Take 1 tablet by mouth twice a day as needed for arthritis pain  Complete Medication List: 1)  Metformin Hcl 500  Mg Tabs (Metformin hcl) .Marland Kitchen.. 1 two times a day 2)  Diclofenac Sodium 50 Mg Tbec (Diclofenac sodium) .... Take 1 tablet by mouth twice a day as needed for arthritis pain 3)  Qvar 80 Mcg/act Aers (Beclomethasone dipropionate) .... Inhale 2 puff as directed once a day 4)  Proventil Hfa 108 (90 Base) Mcg/act Aers (Albuterol sulfate) .... Inhale 2 puff as directed four times a day prn 5)  Vitamin D 1000 Unit Tabs (Cholecalciferol) .Marland Kitchen.. 1 tab daily 6)  Fish Oil 1000 Mg Caps (Omega-3 fatty acids) .... Once daily 7)  Precision Pcx  Plus Test Strp (Glucose blood) .... Check as directed for blood sugars 8)  Azithromycin 250 Mg Tabs (Azithromycin) .... 2 by  mouth today and then 1 daily for 4 days Prescriptions: PROVENTIL HFA 108 (90 BASE) MCG/ACT AERS (ALBUTEROL SULFATE) Inhale 2 puff as directed four times a day prn  #1 x 3   Entered and Authorized by:   Ruthe Mannan MD   Signed by:   Ruthe Mannan MD on 01/30/2011   Method used:   Electronically to        [facility]. [address] [phone]* (retail)       [address] [place], [place]  [address]       Ph: [phone]       Fax: [phone]   RxID:   [id number] QVAR 80 MCG/ACT AERS (BECLOMETHASONE DIPROPIONATE) Inhale 2 puff as directed once a day  #7.3 Gram x 10   Entered and Authorized by:   Ruthe Mannan MD   Signed by:   Ruthe Mannan MD on 01/30/2011   Method used:   Electronically to        [facility]. [address] [phone]* (retail)       [address] [place], [place]  [address]       Ph: [phone]       Fax: [phone]   RxID:   [id number] AZITHROMYCIN 250 MG  TABS (AZITHROMYCIN) 2 by  mouth today and then 1 daily for 4 days  #6 x 0   Entered and Authorized by:   Ruthe Mannan MD   Signed by:   Ruthe Mannan MD on 01/30/2011   Method used:   Electronically to        [facility]. [address] [phone]* (retail)       [address] [place], [place]  [id number]       Ph: [phone]       Fax: [phone]   RxID:   [id number]    Orders Added: 1)  Est. Patient Level IV [299[address]]    Current Allergies (reviewed today): No known allergies

## 2011-03-15 ENCOUNTER — Telehealth: Payer: Self-pay | Admitting: *Deleted

## 2011-03-15 MED ORDER — GLUCOSE BLOOD VI STRP: ORAL_STRIP | Status: AC

## 2011-03-15 NOTE — Telephone Encounter (Signed)
Pharmacy called for test strip correction.

## 2011-03-16 ENCOUNTER — Ambulatory Visit: Payer: Self-pay | Admitting: Internal Medicine

## 2011-03-21 ENCOUNTER — Ambulatory Visit (INDEPENDENT_AMBULATORY_CARE_PROVIDER_SITE_OTHER): Payer: BC Managed Care – PPO | Admitting: Internal Medicine

## 2011-03-21 ENCOUNTER — Encounter: Payer: Self-pay | Admitting: Internal Medicine

## 2011-03-21 VITALS — BP 133/72 | HR 79 | Temp 98.4°F | Ht 66.0 in | Wt 200.0 lb

## 2011-03-21 DIAGNOSIS — E119 Type 2 diabetes mellitus without complications: Secondary | ICD-10-CM

## 2011-03-21 DIAGNOSIS — J45909 Unspecified asthma, uncomplicated: Secondary | ICD-10-CM

## 2011-03-21 DIAGNOSIS — G479 Sleep disorder, unspecified: Secondary | ICD-10-CM

## 2011-03-21 DIAGNOSIS — M1990 Unspecified osteoarthritis, unspecified site: Secondary | ICD-10-CM

## 2011-03-21 NOTE — Progress Notes (Signed)
  Subjective:    Patient ID: Bridget Romero, female    DOB: 1946/03/22, 65 y.o.   MRN: [id number]  HPI Doing fine Sleeps reasonably well back on the temazepam Doesn't take it every night Mostly uses for the chronic tinnitus that keeps her up Clonazepam really was too much for her  Checks sugars twice a day Discussed checking no more than 1 per day  Generally under 140 No hypoglycemic reactions Has eye exam yearly in June  Occ wheezes at night May use it three times a week No cough or dyspnea with activity  Neck pain is quiet Hasn't needed diclofenac lately  Past Medical History  Diagnosis Date  . Allergy   . Asthma   . Diabetes mellitus   . Hyperlipidemia   . Osteoarthritis     No past surgical history on file.  Family History  Problem Relation Age of Onset  . Heart disease Mother   . Cancer Father     History   Social History  . Marital Status: Married    Spouse Name: N/A    Number of Children: 2  . Years of Education: N/A   Occupational History  . [employer]   Social History Main Topics  . Smoking status: Former Games developer  . Smokeless tobacco: Not on file  . Alcohol Use: No  . Drug Use: Not on file  . Sexually Active: Not on file   Other Topics Concern  . Not on file   Social History Narrative  . No narrative on file       Review of Systems Appetite is fine Weight is up 4# Bowels fine No mood problems Exercises regularly by walking or exercise bike    Objective:   Physical Exam  Constitutional: She appears well-developed and well-nourished. No distress.  Neck: Normal range of motion. Neck supple. No thyromegaly present.  Cardiovascular: Normal rate, regular rhythm, normal heart sounds and intact distal pulses.  Exam reveals no gallop.   No murmur heard.      1+ pedal pulses  Pulmonary/Chest: Effort normal. No respiratory distress. She has no rales.       Not tight but slight exp wheeze  Musculoskeletal: She  exhibits no edema and no tenderness.  Lymphadenopathy:    She has no cervical adenopathy.  Neurological:       Normal sensation in plantar feet to light touch  Psychiatric: She has a normal mood and affect. Judgment and thought content normal.          Assessment & Plan:

## 2011-05-01 ENCOUNTER — Telehealth: Payer: Self-pay | Admitting: *Deleted

## 2011-05-01 NOTE — Telephone Encounter (Signed)
Not unless we identify a specific deficiency (can cause anemia or sensation changes) A multivitamin with B12 in it would be a good idea

## 2011-05-01 NOTE — Telephone Encounter (Signed)
Patient is asking if you think she should be taking vit b12. Please advise.

## 2011-05-02 NOTE — Telephone Encounter (Signed)
Spoke with patient and advised results   

## 2011-05-15 ENCOUNTER — Ambulatory Visit (INDEPENDENT_AMBULATORY_CARE_PROVIDER_SITE_OTHER): Payer: BC Managed Care – PPO | Admitting: Family Medicine

## 2011-05-15 ENCOUNTER — Encounter: Payer: Self-pay | Admitting: Family Medicine

## 2011-05-15 VITALS — BP 140/80 | HR 74 | Temp 98.5°F | Ht 66.0 in | Wt 189.0 lb

## 2011-05-15 DIAGNOSIS — J019 Acute sinusitis, unspecified: Secondary | ICD-10-CM | POA: Insufficient documentation

## 2011-05-15 DIAGNOSIS — J329 Chronic sinusitis, unspecified: Secondary | ICD-10-CM

## 2011-05-15 LAB — HM DIABETES EYE EXAM

## 2011-05-15 MED ORDER — AMOXICILLIN 500 MG PO CAPS: 500.0000 mg | ORAL_CAPSULE | Freq: Two times a day (BID) | ORAL | Status: AC

## 2011-05-15 NOTE — Progress Notes (Signed)
65 yo with h/o asthma here for ?sinus infection.  Using her Proair inhaler a few times a day for past few days. dry cough, wheezing, sinus pressure. Taking Loratidine and Sudafed with no relief of symptoms.  No fevers, but did have chills last night. No CP or SOB.  The PMH, PSH, Social History, Family History, Medications, and allergies have been reviewed in [facility], and have been updated if relevant.   Review of Systems       See HPI General:  Denies fever. ENT:  Complains of nasal congestion, postnasal drainage, sinus pressure, and sore throat. CV:  Denies chest pain or discomfort. Resp:  Complains of cough and wheezing; denies shortness of breath and sputum productive.  Physical Exam BP 140/80  Pulse 74  Temp(Src) 98.5 F (36.9 C) (Oral)  Ht 5\' 6"  (1.676 m)  Wt 189 lb (85.73 kg)  BMI 30.51 kg/m2  General:  alert and normal appearance.   VSS Nose:  mild erythema, sinuses +/- TTP Mouth:  +PND Lungs:  normal respiratory effort, no intercostal retractions, no accessory muscle use. +exp wheezes, L?R, ronchi Heart:  normal rate, regular rhythm, no murmur, and no gallop.   Extremities:  no edema Psych:  normally interactive, good eye contact, not anxious appearing, and not depressed appearing.

## 2011-05-15 NOTE — Patient Instructions (Signed)
Take antibiotic as directed.  Drink lots of fluids.  Treat sympotmatically with Mucinex, nasal saline irrigation, and Tylenol/Ibuprofen. Also try claritin D or zyrtec D over the counter- two times a day as needed ( have to sign for them at pharmacy). You can use warm compresses.  Cough suppressant at night. Call if not improving as expected in 5-7 days.    

## 2011-05-15 NOTE — Assessment & Plan Note (Signed)
New. Given duration and progression of symptoms, will treat for bacterial sinusitis with amoxicillin. Supportive care as per pt instructions.

## 2011-05-17 ENCOUNTER — Ambulatory Visit: Payer: BC Managed Care – PPO | Admitting: Internal Medicine

## 2011-05-18 ENCOUNTER — Encounter: Payer: Self-pay | Admitting: Internal Medicine

## 2011-05-26 ENCOUNTER — Emergency Department (HOSPITAL_COMMUNITY)
Admission: EM | Admit: 2011-05-26 | Discharge: 2011-05-26 | Disposition: A | Discharge status: Home / Self Care | Payer: BC Managed Care – PPO | Attending: Emergency Medicine | Admitting: Emergency Medicine

## 2011-05-26 ENCOUNTER — Emergency Department (HOSPITAL_COMMUNITY): Payer: BC Managed Care – PPO

## 2011-05-26 ENCOUNTER — Encounter (HOSPITAL_COMMUNITY): Payer: Self-pay | Admitting: Radiology

## 2011-05-26 DIAGNOSIS — R5381 Other malaise: Secondary | ICD-10-CM | POA: Insufficient documentation

## 2011-05-26 DIAGNOSIS — R0609 Other forms of dyspnea: Secondary | ICD-10-CM | POA: Insufficient documentation

## 2011-05-26 DIAGNOSIS — R42 Dizziness and giddiness: Secondary | ICD-10-CM | POA: Insufficient documentation

## 2011-05-26 DIAGNOSIS — E119 Type 2 diabetes mellitus without complications: Secondary | ICD-10-CM | POA: Insufficient documentation

## 2011-05-26 DIAGNOSIS — H538 Other visual disturbances: Secondary | ICD-10-CM | POA: Insufficient documentation

## 2011-05-26 DIAGNOSIS — J329 Chronic sinusitis, unspecified: Secondary | ICD-10-CM | POA: Insufficient documentation

## 2011-05-26 DIAGNOSIS — Z79899 Other long term (current) drug therapy: Secondary | ICD-10-CM | POA: Insufficient documentation

## 2011-05-26 DIAGNOSIS — R0989 Other specified symptoms and signs involving the circulatory and respiratory systems: Secondary | ICD-10-CM | POA: Insufficient documentation

## 2011-05-26 DIAGNOSIS — R5383 Other fatigue: Secondary | ICD-10-CM | POA: Insufficient documentation

## 2011-05-26 DIAGNOSIS — R51 Headache: Secondary | ICD-10-CM | POA: Insufficient documentation

## 2011-05-26 LAB — POCT I-STAT, CHEM 8
Calcium, Ion: 1.17 mmol/L (ref 1.12–1.32)
Hemoglobin: 12.6 g/dL (ref 12.0–15.0)
Sodium: 142 mEq/L (ref 135–145)
TCO2: 25 mmol/L (ref 0–100)

## 2011-05-26 LAB — DIFFERENTIAL
Basophils Absolute: 0 10*3/uL (ref 0.0–0.1)
Basophils Relative: 1 % (ref 0–1)
Eosinophils Absolute: 0.6 10*3/uL (ref 0.0–0.7)
Monocytes Absolute: 0.4 10*3/uL (ref 0.1–1.0)
Neutro Abs: 4.8 10*3/uL (ref 1.7–7.7)
Neutrophils Relative %: 56 % (ref 43–77)

## 2011-05-26 LAB — URINALYSIS, ROUTINE W REFLEX MICROSCOPIC
Bilirubin Urine: NEGATIVE
Ketones, ur: NEGATIVE mg/dL
Nitrite: NEGATIVE
Urobilinogen, UA: 0.2 mg/dL (ref 0.0–1.0)
pH: 5 (ref 5.0–8.0)

## 2011-05-26 LAB — CK TOTAL AND CKMB (NOT AT ARMC): CK, MB: 1.2 ng/mL (ref 0.3–4.0)

## 2011-05-26 LAB — CBC
Hemoglobin: 11.6 g/dL — ABNORMAL LOW (ref 12.0–15.0)
MCH: 27.2 pg (ref 26.0–34.0)
MCHC: 32.2 g/dL (ref 30.0–36.0)
Platelets: 345 10*3/uL (ref 150–400)
RBC: 4.26 MIL/uL (ref 3.87–5.11)

## 2011-05-26 LAB — PRO B NATRIURETIC PEPTIDE: Pro B Natriuretic peptide (BNP): 26.1 pg/mL (ref 0–125)

## 2011-06-05 ENCOUNTER — Telehealth: Payer: Self-pay | Admitting: *Deleted

## 2011-06-05 NOTE — Telephone Encounter (Deleted)
Patient

## 2011-06-05 NOTE — Telephone Encounter (Signed)
Patient is calling with another concern. She says that sometimes in the am her sugar is around 120 and she feels really shaky. She is asking if it is possible to only take the metformin once a day and just monitor her blood sugars. Please advise.

## 2011-06-05 NOTE — Telephone Encounter (Signed)
Spoke with patient and advised results   

## 2011-06-05 NOTE — Telephone Encounter (Signed)
Triage Record Num: [id number] Operator: Martie Lee Long Patient Name: Bridget Romero Call Date & Time: 06/03/2011 9:15:49AM Patient Phone: [phone] PCP: Tillman Abide Patient Gender: Female PCP Fax : [phone] Patient DOB: 09/21/1946 [facility] Name[facility] Reason for Call: Nalani/Patient Pt is currently taking a Doxycycline for an Sinus Infection that she was given in the ED on 05/26/11. Pt is calling about stopping taking the antibiotic because it is causing vomiting and abdominal cramps and urination. Instructed that I cannot authorize to stop medication. Pt would like to stop her antibiotic and follow up with office on Monday. Instructed to call back if needed or to see Provider if worsen sx's from Sinus Infection. Protocol(s) Used: Office Note Recommended Outcome per Protocol: Information Noted and Sent to Office Reason for Outcome: Caller information to office Care Advice: ~ 06/03/2011 10:29:32AM Page 1 of 1 [id number]

## 2011-06-05 NOTE — Telephone Encounter (Signed)
Okay to stop the doxy if her sinuses feel better  Her diabetes control has been very good so it would be okay to take the metformin just in the morning and monitor her sugars. She should let me know if they are regularly over 140-150

## 2011-06-05 NOTE — Telephone Encounter (Signed)
Patient says that the doxycycline is making her feel nauseas and would really like to stop taking it. She says that her sinus infection symptoms have cleared up. She is asking if she does stop taking the med should she take another antibiotic in its place. Uses [facility] [address] if needed.

## 2011-08-11 ENCOUNTER — Other Ambulatory Visit: Payer: Self-pay | Admitting: *Deleted

## 2011-08-11 ENCOUNTER — Other Ambulatory Visit: Payer: Self-pay | Admitting: Internal Medicine

## 2011-08-13 NOTE — Telephone Encounter (Signed)
Okay #30 x 3 

## 2011-08-14 MED ORDER — TEMAZEPAM 30 MG PO CAPS: 30.0000 mg | ORAL_CAPSULE | Freq: Every evening | ORAL | Status: DC | PRN

## 2011-09-18 LAB — CBC
HCT: 40
Hemoglobin: 13.1
MCHC: 32.8
MCV: 84.7
RBC: 4.72
WBC: 6.9

## 2011-09-18 LAB — COMPREHENSIVE METABOLIC PANEL
AST: 17
BUN: 14
CO2: 26
Calcium: 9.3
Chloride: 106
Creatinine, Ser: 0.72
GFR calc non Af Amer: >60
Glucose, Bld: 161 — ABNORMAL HIGH
Total Bilirubin: 0.7

## 2011-09-18 LAB — DIFFERENTIAL
Basophils Absolute: 0
Eosinophils Relative: 10 — ABNORMAL HIGH
Lymphocytes Relative: 28
Lymphs Abs: 1.9
Neutro Abs: 3.9
Neutrophils Relative %: 57

## 2011-09-20 ENCOUNTER — Other Ambulatory Visit: Payer: Self-pay | Admitting: Internal Medicine

## 2011-09-22 ENCOUNTER — Ambulatory Visit (INDEPENDENT_AMBULATORY_CARE_PROVIDER_SITE_OTHER): Payer: BC Managed Care – PPO | Admitting: Internal Medicine

## 2011-09-22 ENCOUNTER — Encounter: Payer: Self-pay | Admitting: Internal Medicine

## 2011-09-22 VITALS — BP 127/77 | HR 79 | Temp 98.3°F | Ht 63.0 in | Wt 195.2 lb

## 2011-09-22 DIAGNOSIS — Z1231 Encounter for screening mammogram for malignant neoplasm of breast: Secondary | ICD-10-CM

## 2011-09-22 DIAGNOSIS — E785 Hyperlipidemia, unspecified: Secondary | ICD-10-CM

## 2011-09-22 DIAGNOSIS — E119 Type 2 diabetes mellitus without complications: Secondary | ICD-10-CM

## 2011-09-22 DIAGNOSIS — Z0000 Encounter for general adult medical examination without abnormal findings: Secondary | ICD-10-CM | POA: Insufficient documentation

## 2011-09-22 DIAGNOSIS — J45909 Unspecified asthma, uncomplicated: Secondary | ICD-10-CM

## 2011-09-22 LAB — HEPATIC FUNCTION PANEL
Albumin: 3.9 g/dL (ref 3.5–5.2)
Total Protein: 7.6 g/dL (ref 6.0–8.3)

## 2011-09-22 LAB — CBC WITH DIFFERENTIAL/PLATELET
Basophils Absolute: 0.1 10*3/uL (ref 0.0–0.1)
Eosinophils Absolute: 1.3 10*3/uL — ABNORMAL HIGH (ref 0.0–0.7)
HCT: 37.2 % (ref 36.0–46.0)
Hemoglobin: 12 g/dL (ref 12.0–15.0)
Lymphs Abs: 2.5 10*3/uL (ref 0.7–4.0)
MCHC: 32.4 g/dL (ref 30.0–36.0)
Neutro Abs: 4.4 10*3/uL (ref 1.4–7.7)
Platelets: 356 10*3/uL (ref 150.0–400.0)
RDW: 14.4 % (ref 11.5–14.6)

## 2011-09-22 LAB — BASIC METABOLIC PANEL
BUN: 14 mg/dL (ref 6–23)
CO2: 28 mEq/L (ref 19–32)
Calcium: 9.3 mg/dL (ref 8.4–10.5)
Glucose, Bld: 131 mg/dL — ABNORMAL HIGH (ref 70–99)
Potassium: 4 mEq/L (ref 3.5–5.1)
Sodium: 141 mEq/L (ref 135–145)

## 2011-09-22 LAB — LIPID PANEL
Cholesterol: 181 mg/dL (ref 0–200)
HDL: 52.3 mg/dL (ref >39.00)

## 2011-09-22 LAB — HEMOGLOBIN A1C: Hgb A1c MFr Bld: 7.2 % — ABNORMAL HIGH (ref 4.6–6.5)

## 2011-09-22 MED ORDER — PREDNISONE 20 MG PO TABS: 40.0000 mg | ORAL_TABLET | Freq: Every day | ORAL | Status: AC

## 2011-09-22 NOTE — Assessment & Plan Note (Signed)
Doing well Pap due next year Doesn't want flu or pneumovax Set up mammo UTD on colon Tries to exercise

## 2011-09-22 NOTE — Assessment & Plan Note (Signed)
Lab Results  Component Value Date   HGBA1C 7.0* 03/21/2011   Seems to have good control Will check labs

## 2011-09-22 NOTE — Patient Instructions (Signed)
Please take the prescription prednisone for 5 days Start the flovent samples --2 puffs a day and stay on them every day Check your sugars at most once a day --fasting in the morning Please set up your mammogram

## 2011-09-22 NOTE — Progress Notes (Signed)
Subjective:    Patient ID: Bridget Romero, female    DOB: Nov 03, 1946, 65 y.o.   MRN: [id number]  HPI DOing okay Mild wheezing recently--may have some bronchitis Mostly at night Has been using proair and OTC cough/decongestant. Inhaler Can't afford the steroid inhaler Discussed spacer also  Checks sugars twice a day Discussed checking like this 2-3 times per week Usually 150 post prandial One hypoglycemic reaction when she missed a meal--no neuroglycopenia  Current Outpatient Prescriptions on File Prior to Visit  Medication Sig Dispense Refill  . albuterol (PROVENTIL HFA) 108 (90 BASE) MCG/ACT inhaler Inhale 2 puffs into the lungs every 4 (four) hours as needed.        . beclomethasone (QVAR) 80 MCG/ACT inhaler Inhale 2 puffs into the lungs as needed.        . Cholecalciferol (VITAMIN D) 1000 UNITS capsule Take 1,000 Units by mouth daily.        . diclofenac (VOLTAREN) 50 MG EC tablet Take 50 mg by mouth 2 (two) times daily.        . fish oil-omega-3 fatty acids 1000 MG capsule Take 1 g by mouth.        Marland Kitchen glucose blood (PRECISION PCX PLUS TEST) test strip Use as instructed  100 each  12  . metFORMIN (GLUCOPHAGE) 500 MG tablet take 1 tablet by mouth twice a day  60 tablet  12  . temazepam (RESTORIL) 30 MG capsule Take 1 capsule (30 mg total) by mouth at bedtime as needed.  30 capsule  3    No Known Allergies  Past Medical History  Diagnosis Date  . Allergy   . Asthma   . Diabetes mellitus   . Osteoarthritis   . Hyperlipidemia     No past surgical history on file.  Family History  Problem Relation Age of Onset  . Heart disease Mother   . Cancer Father     History   Social History  . Marital Status: Married    Spouse Name: N/A    Number of Children: 2  . Years of Education: N/A   Occupational History  . [employer]   Social History Main Topics  . Smoking status: Former Games developer  . Smokeless tobacco: Not on file  . Alcohol Use: No  . Drug  Use: Not on file  . Sexually Active: Not on file   Other Topics Concern  . Not on file   Social History Narrative  . No narrative on file   Review of Systems  Constitutional: Negative for fatigue and unexpected weight change.       Wears seat belt  HENT: Positive for congestion, rhinorrhea and tinnitus. Negative for hearing loss and dental problem.        Intermittent with dentist Full upper dentures  Eyes: Negative for visual disturbance.       No diplopia or unilateral vision loss  Respiratory: Positive for cough and wheezing. Negative for choking and shortness of breath.   Cardiovascular: Negative for chest pain, palpitations and leg swelling.  Gastrointestinal: Negative for nausea, vomiting, abdominal pain, constipation and blood in stool.       No heartburn  Genitourinary: Negative for urgency, frequency, difficulty urinating and dyspareunia.       No sex--no problem  Musculoskeletal: Negative for back pain, joint swelling and arthralgias.  Skin: Negative for rash.       No suspicious lesions  Neurological: Negative for dizziness, syncope, weakness, light-headedness, numbness  and headaches.  Hematological: Negative for adenopathy. Does not bruise/bleed easily.  Psychiatric/Behavioral: Positive for sleep disturbance. Negative for dysphoric mood. The patient is not nervous/anxious.        Sleeping okay--some wheezing and hormonal symptoms       Objective:   Physical Exam  Constitutional: She is oriented to person, place, and time. She appears well-developed and well-nourished. No distress.  HENT:  Head: Normocephalic and atraumatic.  Right Ear: External ear normal.  Left Ear: External ear normal.  Mouth/Throat: Oropharynx is clear and moist. No oropharyngeal exudate.       TMs normal  Eyes: Conjunctivae and EOM are normal. Pupils are equal, round, and reactive to light.       Fundi benign  Neck: Normal range of motion. Neck supple. No thyromegaly present.    Cardiovascular: Normal rate, regular rhythm and intact distal pulses.  Exam reveals no gallop.   No murmur heard. Pulmonary/Chest: Effort normal. No respiratory distress. She has wheezes. She has no rales.       Not really tight but moderate exp wheezes  Abdominal: Soft. She exhibits no mass. There is no tenderness.  Genitourinary:       Breasts with mild cystic changes bilaterally  Musculoskeletal: Normal range of motion. She exhibits no edema and no tenderness.  Lymphadenopathy:    She has no cervical adenopathy.    She has no axillary adenopathy.  Neurological: She is alert and oriented to person, place, and time. She exhibits normal muscle tone.       Normal strength and gait Normal sensation on plantar feet  Skin: Skin is warm. No rash noted.  Psychiatric: She has a normal mood and affect. Her behavior is normal. Judgment and thought content normal.          Assessment & Plan:

## 2011-09-22 NOTE — Assessment & Plan Note (Signed)
Mild exacerbation now Can't afford the steroid inhaler Gave her samples of flovent 44----- 2 puffs daily 5 days of prednisone

## 2011-10-17 ENCOUNTER — Ambulatory Visit (INDEPENDENT_AMBULATORY_CARE_PROVIDER_SITE_OTHER): Payer: BC Managed Care – PPO | Admitting: Family Medicine

## 2011-10-17 ENCOUNTER — Encounter: Payer: Self-pay | Admitting: Family Medicine

## 2011-10-17 VITALS — BP 132/78 | HR 84 | Temp 98.4°F | Wt 200.0 lb

## 2011-10-17 DIAGNOSIS — R300 Dysuria: Secondary | ICD-10-CM

## 2011-10-17 LAB — POCT URINALYSIS DIPSTICK
Bilirubin, UA: NEGATIVE
Blood, UA: NEGATIVE
Glucose, UA: NEGATIVE
Ketones, UA: NEGATIVE
Leukocytes, UA: NEGATIVE
Nitrite, UA: NEGATIVE
Protein, UA: NEGATIVE
Spec Grav, UA: 1.015
Urobilinogen, UA: NEGATIVE
pH, UA: 6

## 2011-10-17 MED ORDER — SULFAMETHOXAZOLE-TRIMETHOPRIM 800-160 MG PO TABS: 1.0000 | ORAL_TABLET | Freq: Two times a day (BID) | ORAL | Status: AC

## 2011-10-17 NOTE — Patient Instructions (Signed)
Drink plenty of water and start the antibiotics today.  We'll contact you with your lab report.  Take care.   

## 2011-10-17 NOTE — Progress Notes (Signed)
Urinary frequency but no burning.  Some lower abd pressure, but very minimal.  All sx started last week, some relief with AZO last week.  She drinks a lot of coffee.  She's had UTIs prev, occ with sx like this.    dysuria:no duration of symptoms: since last week abdominal pain:as above fevers:no back pain:no vomiting:no  Meds, vitals, and allergies reviewed.   ROS: See HPI.  Otherwise negative.    GEN: nad, alert and oriented HEENT: mucous membranes moist NECK: supple CV: rrr.  PULM: ctab, no inc wob ABD: soft, +bs, mildly tender just to the L of the suprapubic area EXT: no edema SKIN: no acute rash BACK: no CVA pain  U/a wnl.

## 2011-10-18 DIAGNOSIS — R300 Dysuria: Secondary | ICD-10-CM | POA: Insufficient documentation

## 2011-10-18 NOTE — Assessment & Plan Note (Signed)
Possible UTI given her hx and exam.  Nontoxic, check ucx and start septra.  She agrees.  Inc fluids in meantime.

## 2011-10-19 LAB — URINE CULTURE: Colony Count: 4000

## 2011-11-10 ENCOUNTER — Telehealth: Payer: Self-pay | Admitting: Internal Medicine

## 2011-11-10 NOTE — Telephone Encounter (Signed)
Patient requesting call back today at [phone]

## 2011-11-10 NOTE — Telephone Encounter (Signed)
Patient calling having UTI pressure she thinks, she can't come in today because she doesn't have a car but she will call on Monday if no better, per pt it's only pressure.

## 2011-11-11 NOTE — Telephone Encounter (Signed)
Please check on her on Monday morning and add on if needed

## 2011-11-13 NOTE — Telephone Encounter (Signed)
Line busy x2, will try later.

## 2011-11-21 ENCOUNTER — Ambulatory Visit: Payer: Self-pay | Admitting: Internal Medicine

## 2011-11-28 ENCOUNTER — Ambulatory Visit (INDEPENDENT_AMBULATORY_CARE_PROVIDER_SITE_OTHER): Payer: BC Managed Care – PPO | Admitting: Family Medicine

## 2011-11-28 ENCOUNTER — Encounter: Payer: Self-pay | Admitting: *Deleted

## 2011-11-28 ENCOUNTER — Encounter: Payer: Self-pay | Admitting: Family Medicine

## 2011-11-28 VITALS — BP 128/80 | HR 80 | Temp 98.3°F | Wt 199.0 lb

## 2011-11-28 DIAGNOSIS — R1031 Right lower quadrant pain: Secondary | ICD-10-CM

## 2011-11-28 DIAGNOSIS — R109 Unspecified abdominal pain: Secondary | ICD-10-CM | POA: Insufficient documentation

## 2011-11-28 DIAGNOSIS — R102 Pelvic and perineal pain: Secondary | ICD-10-CM

## 2011-11-28 LAB — POCT URINALYSIS DIPSTICK
Bilirubin, UA: NEGATIVE
Blood, UA: NEGATIVE
Nitrite, UA: NEGATIVE
Protein, UA: NEGATIVE
Urobilinogen, UA: 0.2
pH, UA: 6

## 2011-11-28 MED ORDER — NAPROXEN 500 MG PO TABS: ORAL_TABLET | ORAL | Status: DC

## 2011-11-28 NOTE — Assessment & Plan Note (Signed)
Unclear etiology.  Postmenopausal. UA normal today, no blood, no evidence of infection, sxs not consistent with UTI. Possible muscle strain vs possible pelvic pain. Treat with NSAID, update if sxs not improved, consider pelvic US to eval ovaries, uterus.

## 2011-11-28 NOTE — Patient Instructions (Signed)
Urine looking ok today. May have been abdominal strain. May use naprosyn twice daily as needed with food for discomfort. If not improving as expected, please let me know. Good to see you today, call us with questions.

## 2011-11-28 NOTE — Progress Notes (Signed)
  Subjective:    Patient ID: Bridget Romero, female    DOB: 29-Jan-1946, 65 y.o.   MRN: [id number]  HPI CC: abd pressure  Came in 10/17/2011 with frequency and abd pressure, and dx with possible UTI, treated with septra bid x5 days.  UCx insignificant growth.  Sxs improved but then started having pressure again, back pain.  This started again last week, right lower quadrant mild discomfort difficult to describe, not pain per se.  Last week worse, this week better.  Mild back pain initially as well.  Taking ibuprofen which helped.  Denies inciting trauma.  Doesn't remember straining abd.  Has been raking leaves recently.  H/o DM - sugars well controlled. Staying well hydrated  Has pap smear coming up, has had normal in past.  Spaced every 3 years.  Review of Systems No fevers/chills, dysuria, urgency, frequency, hematuria, diarrhea/constipation, nausea/vomiting, BM changes.  No NS, weight changes.  Some hot flashes. O/w per HPI    Objective:   Physical Exam  Nursing note and vitals reviewed. Constitutional: She appears well-developed and well-nourished. No distress.  HENT:  Head: Normocephalic and atraumatic.  Mouth/Throat: Oropharynx is clear and moist. No oropharyngeal exudate.  Eyes: Conjunctivae and EOM are normal. Pupils are equal, round, and reactive to light.  Neck: Normal range of motion. Neck supple.  Cardiovascular: Normal rate, regular rhythm, normal heart sounds and intact distal pulses.   No murmur heard. Pulmonary/Chest: Effort normal and breath sounds normal. No respiratory distress. She has no wheezes. She has no rales.  Abdominal: Soft. Bowel sounds are normal. She exhibits no distension and no mass. There is no hepatosplenomegaly. There is no tenderness. There is no rigidity, no rebound, no guarding, no CVA tenderness, no tenderness at McBurney's point and negative Murphy's sign. No hernia.       Mild suprapubic tenderness  Musculoskeletal: She exhibits no edema.    Lymphadenopathy:    She has no cervical adenopathy.  Skin: Skin is warm and dry. No rash noted.  Psychiatric: She has a normal mood and affect.   UA - normal.    Assessment & Plan:

## 2011-12-21 ENCOUNTER — Emergency Department: Payer: Self-pay | Admitting: Unknown Physician Specialty

## 2012-03-21 ENCOUNTER — Ambulatory Visit (INDEPENDENT_AMBULATORY_CARE_PROVIDER_SITE_OTHER): Payer: BC Managed Care – PPO | Admitting: Internal Medicine

## 2012-03-21 ENCOUNTER — Encounter: Payer: Self-pay | Admitting: Internal Medicine

## 2012-03-21 VITALS — BP 128/80 | HR 77 | Temp 98.5°F | Wt 200.0 lb

## 2012-03-21 DIAGNOSIS — E785 Hyperlipidemia, unspecified: Secondary | ICD-10-CM

## 2012-03-21 DIAGNOSIS — J45909 Unspecified asthma, uncomplicated: Secondary | ICD-10-CM

## 2012-03-21 DIAGNOSIS — E119 Type 2 diabetes mellitus without complications: Secondary | ICD-10-CM

## 2012-03-21 DIAGNOSIS — M1990 Unspecified osteoarthritis, unspecified site: Secondary | ICD-10-CM

## 2012-03-21 MED ORDER — DICLOFENAC SODIUM 50 MG PO TBEC: 50.0000 mg | DELAYED_RELEASE_TABLET | Freq: Two times a day (BID) | ORAL | Status: DC | PRN

## 2012-03-21 NOTE — Progress Notes (Signed)
Subjective:    Patient ID: Bridget Romero, female    DOB: 22-May-1946, 66 y.o.   MRN: [id number]  HPI Doing fairly well Abdominal pain did resolve  Asks for refill on diclofenac Uses mostly at night for arthritis pain No stomach troubles Satisfied with how it works  Airline pilot sugars regularly 122 this AM---occ up over 130 Volunteers at hospice store---stays physically active [employer] how she is eating  Asthma has been quiet Uses qvar regularly--or the samples I gave Hasn't been using the albuterol regularly No regular cough No SOB  Discussed cholesterol med Not excited about another med  Current Outpatient Prescriptions on File Prior to Visit  Medication Sig Dispense Refill  . albuterol (PROVENTIL HFA) 108 (90 BASE) MCG/ACT inhaler Inhale 2 puffs into the lungs every 4 (four) hours as needed.        . beclomethasone (QVAR) 80 MCG/ACT inhaler Inhale 2 puffs into the lungs as needed.        . Cholecalciferol (VITAMIN D) 1000 UNITS capsule Take 1,000 Units by mouth daily.        . diclofenac (VOLTAREN) 50 MG EC tablet Take 50 mg by mouth 2 (two) times daily as needed. For arthritis pain      . fish oil-omega-3 fatty acids 1000 MG capsule Take 1 g by mouth.        . metFORMIN (GLUCOPHAGE) 500 MG tablet take 1 tablet by mouth twice a day  60 tablet  12  . Multiple Vitamin (MULTIVITAMIN) tablet Take 1 tablet by mouth daily.        . temazepam (RESTORIL) 30 MG capsule Take 1 capsule (30 mg total) by mouth at bedtime as needed.  30 capsule  3    No Known Allergies  Past Medical History  Diagnosis Date  . Allergy   . Asthma   . Diabetes mellitus   . Osteoarthritis   . Hyperlipidemia     No past surgical history on file.  Family History  Problem Relation Age of Onset  . Heart disease Mother   . Cancer Father     History   Social History  . Marital Status: Married    Spouse Name: N/A    Number of Children: 2  . Years of Education: N/A   Occupational History  .  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 Smoking status: Former Games developer  . Smokeless tobacco: Not on file  . Alcohol Use: No  . Drug Use: Not on file  . Sexually Active: Not on file   Other Topics Concern  . Not on file   Social History Narrative  . No narrative on file   Review of Systems Weight is stable Sleeps okay     Objective:   Physical Exam  Constitutional: She appears well-developed and well-nourished. No distress.  Neck: Normal range of motion. Neck supple. No thyromegaly present.  Cardiovascular: Normal rate, regular rhythm, normal heart sounds and intact distal pulses.  Exam reveals no gallop.   No murmur heard. Pulmonary/Chest: Effort normal and breath sounds normal. No respiratory distress. She has no wheezes. She has no rales.  Musculoskeletal: She exhibits no edema and no tenderness.  Lymphadenopathy:    She has no cervical adenopathy.  Neurological:       Normal sensation in feet  Skin: No rash noted.       No foot lesions  Psychiatric: She has a normal  mood and affect. Her behavior is normal. Thought content normal.          Assessment & Plan:

## 2012-03-21 NOTE — Assessment & Plan Note (Signed)
Uses the diclofenac mostly for leg pain

## 2012-03-21 NOTE — Assessment & Plan Note (Signed)
Mild and intermittent Does well with the daily inhaled steroids

## 2012-03-21 NOTE — Progress Notes (Signed)
Addended by: Consuello Masse on: 03/21/2012 03:24 PM   Modules accepted: Orders

## 2012-03-21 NOTE — Assessment & Plan Note (Signed)
Lab Results  Component Value Date   LDLCALC 114* 09/22/2011   Discussed meds She prefers not She will work harder on lifestyle

## 2012-03-21 NOTE — Assessment & Plan Note (Signed)
Seems to still have good control Will check A1c Discussed lifestyle

## 2012-03-26 ENCOUNTER — Encounter: Payer: Self-pay | Admitting: *Deleted

## 2012-06-10 ENCOUNTER — Ambulatory Visit (INDEPENDENT_AMBULATORY_CARE_PROVIDER_SITE_OTHER): Payer: BC Managed Care – PPO | Admitting: Internal Medicine

## 2012-06-10 ENCOUNTER — Encounter: Payer: Self-pay | Admitting: Internal Medicine

## 2012-06-10 VITALS — BP 138/80 | HR 77 | Temp 97.7°F | Wt 199.0 lb

## 2012-06-10 DIAGNOSIS — R109 Unspecified abdominal pain: Secondary | ICD-10-CM

## 2012-06-10 DIAGNOSIS — N3090 Cystitis, unspecified without hematuria: Secondary | ICD-10-CM

## 2012-06-10 LAB — POCT URINALYSIS DIPSTICK
Bilirubin, UA: NEGATIVE
Ketones, UA: NEGATIVE
Leukocytes, UA: NEGATIVE
pH, UA: 6.5

## 2012-06-10 MED ORDER — SULFAMETHOXAZOLE-TMP DS 800-160 MG PO TABS: 1.0000 | ORAL_TABLET | Freq: Two times a day (BID) | ORAL | Status: DC

## 2012-06-10 NOTE — Assessment & Plan Note (Signed)
Has vague symptoms but feels this is her bladder Bladder anaesthetics did help  Will treat with septra for 3 days

## 2012-06-10 NOTE — Progress Notes (Signed)
  Subjective:    Patient ID: Bridget Romero, female    DOB: 01-14-1946, 66 y.o.   MRN: [id number]  HPI Having bladder problems Feels full in suprapubic area Started last week---was out of town at [employer] OTC med and this relieved her some  No dysuria No hematuria Occ mild urgency  Some increased frequency in past week  Current Outpatient Prescriptions on File Prior to Visit  Medication Sig Dispense Refill  . albuterol (PROVENTIL HFA) 108 (90 BASE) MCG/ACT inhaler Inhale 2 puffs into the lungs every 4 (four) hours as needed.        . beclomethasone (QVAR) 80 MCG/ACT inhaler Inhale 2 puffs into the lungs as needed.        . Cholecalciferol (VITAMIN D) 1000 UNITS capsule Take 1,000 Units by mouth daily.        . diclofenac (VOLTAREN) 50 MG EC tablet Take 1 tablet (50 mg total) by mouth 2 (two) times daily as needed. For arthritis pain  60 tablet  11  . fish oil-omega-3 fatty acids 1000 MG capsule Take 1 g by mouth.        . metFORMIN (GLUCOPHAGE) 500 MG tablet take 1 tablet by mouth twice a day  60 tablet  12  . Multiple Vitamin (MULTIVITAMIN) tablet Take 1 tablet by mouth daily.        . temazepam (RESTORIL) 30 MG capsule Take 1 capsule (30 mg total) by mouth at bedtime as needed.  30 capsule  3    No Known Allergies  Past Medical History  Diagnosis Date  . Allergy   . Asthma   . Diabetes mellitus   . Osteoarthritis   . Hyperlipidemia     No past surgical history on file.  Family History  Problem Relation Age of Onset  . Heart disease Mother   . Cancer Father     History   Social History  . Marital Status: Married    Spouse Name: N/A    Number of Children: 2  . Years of Education: N/A   Occupational History  . 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 Smoking status: Former Games developer  . Smokeless tobacco: Never Used  . Alcohol Use: No  . Drug Use: Not on file  . Sexually Active: Not on file   Other Topics  Concern  . Not on file   Social History Narrative  . No narrative on file   Review of Systems No fever No N/V Able to eat okay No back pain    Objective:   Physical Exam  Constitutional: She appears well-developed and well-nourished. No distress.  Abdominal: Soft. There is tenderness.       Very slight sensitivity in right suprapubic area  Musculoskeletal:       No CVA tenderness          Assessment & Plan:

## 2012-06-10 NOTE — Patient Instructions (Signed)
Please start the antibiotic. If your symptoms are gone with the first or second dose, you only need to take it for 3 days. If it doesn't go away quickly, take the entire 10 day course

## 2012-06-17 ENCOUNTER — Telehealth: Payer: Self-pay | Admitting: Internal Medicine

## 2012-06-17 NOTE — Telephone Encounter (Signed)
Caller: Sheba/Patient; PCP: Tillman Abide; CB#: [phone]; ; ; Patient reports pain over her bladder when she lays down. Denies urgency, frequency and burning with urination. Patient recently took Bactrim for UTI s/s with Onset 06/10/12; Emergent s/s of "Evaluated by provider and symptoms worsening after following recommended treatment plan for 72 hours" positive per Urinary Symptoms - Female guideline. Appt scheduled with Dr. Ermalene Searing 06/18/12 at 1145am.

## 2012-06-18 ENCOUNTER — Ambulatory Visit (INDEPENDENT_AMBULATORY_CARE_PROVIDER_SITE_OTHER): Payer: BC Managed Care – PPO | Admitting: Family Medicine

## 2012-06-18 ENCOUNTER — Encounter: Payer: Self-pay | Admitting: Family Medicine

## 2012-06-18 VITALS — BP 130/78 | HR 76 | Temp 98.7°F | Ht 63.0 in | Wt 197.5 lb

## 2012-06-18 DIAGNOSIS — N3090 Cystitis, unspecified without hematuria: Secondary | ICD-10-CM

## 2012-06-18 DIAGNOSIS — R1031 Right lower quadrant pain: Secondary | ICD-10-CM

## 2012-06-18 DIAGNOSIS — R300 Dysuria: Secondary | ICD-10-CM

## 2012-06-18 LAB — POCT URINALYSIS DIPSTICK
Ketones, UA: NEGATIVE
Leukocytes, UA: NEGATIVE
Protein, UA: NEGATIVE
Urobilinogen, UA: NEGATIVE

## 2012-06-18 NOTE — Progress Notes (Signed)
  Subjective:    Patient ID: Bridget Romero, female    DOB: Feb 20, 1946, 66 y.o.   MRN: [id number]  HPI  66 year old female presents for follow up on bladder pressure. Seen 6/17 by PCP... For mild urgency, frequency and bladder pressure. Treated with septra for 3 days even though UA clear. No culture sent.  She reports some improvement in symptoms since drinking more water. No dysuria. No frequency, still some pressure in right side, minimal. No fever, no flank pain. No bloating , no change in stool habits. No new meds.   No history of hysterectomy.   Drinks green tea and coffee.  No family history of ovarian cancer.      Review of Systems  Constitutional: Negative for fever and fatigue.  Respiratory: Negative for shortness of breath.   Cardiovascular: Negative for chest pain.  Gastrointestinal: Positive for abdominal pain. Negative for diarrhea, constipation and blood in stool.       Objective:   Physical Exam  Constitutional:       Overweight female in NAD  HENT:  Head: Normocephalic and atraumatic.  Eyes: Conjunctivae are normal. Pupils are equal, round, and reactive to light.  Cardiovascular: Normal rate, regular rhythm, normal heart sounds and intact distal pulses.  Exam reveals no gallop.   No murmur heard. Pulmonary/Chest: Effort normal and breath sounds normal.  Abdominal: Soft. Bowel sounds are normal. She exhibits no mass. There is no hepatosplenomegaly. There is tenderness in the right lower quadrant. No hernia.       Right adenexal tenderness, no central tenderness over bladder.          Assessment & Plan:

## 2012-06-18 NOTE — Assessment & Plan Note (Signed)
No UTI on UA.  She feels pressure is better with pushing fluids and decreasing caffeine... May be due to bladder irritation. Pain is in right adenexa, so concern for ovarian pathology given age. If not improving in next few weeks.. Consider US ovary.

## 2012-06-18 NOTE — Patient Instructions (Signed)
Avoid bladder irritants like citris, tomato, acidic foods, alcohol, caffeine, soda. If pressure not improving call for follow up.. May consider ultrasound of ovary at that time.

## 2012-06-25 ENCOUNTER — Telehealth: Payer: Self-pay | Admitting: Internal Medicine

## 2012-06-25 MED ORDER — AMOXICILLIN 500 MG PO CAPS: 500.0000 mg | ORAL_CAPSULE | Freq: Two times a day (BID) | ORAL | Status: DC

## 2012-06-25 NOTE — Telephone Encounter (Signed)
Caller: Blen/Patient; PCP: Tillman Abide; ;  Call regarding Seen in Office Twice for Pressure in Bladder and urgency. Checked for UTI, Put On Bactrim-06/18/12, Sx  got better but now having them again- onset 06/24/12. She is voiding every 4 hours during the day, but last nignt up 3 x time to void. Afebrile. Triage and Care advice per Urinary Sx Protocol and appnt advised within 72 hours for "evaluated by provider AND no improvement in sx after following treatment plan for the time specifed by provider". SHE IS UNABLE TO COME IN FOR OV D/T FINACIAL ISSUES AND IS WONDERING IF ANOTHER ANTIBIOTIC NEEDS TO BE ORDERED. PLEASE ASK DR. Alphonsus Sias OR BEDSOLE IF SHE NEEDS TO COME IN FOR URINE SAMPLE- if cultures need to be sent- and how to best proceed.  CB#: [phone];

## 2012-06-25 NOTE — Telephone Encounter (Signed)
rx sent to pharmacy by e-script Spoke with patient and advised results   

## 2012-06-25 NOTE — Telephone Encounter (Signed)
We can try another antibiotic--amoxicillin 500mg  bid for 10 days #20 x 0 If the symptoms persist, we will need to check a pelvic Bridget Romero is a bit costly (but we will have to see why she is having ongoing symptoms)

## 2012-07-12 ENCOUNTER — Telehealth: Payer: Self-pay | Admitting: Internal Medicine

## 2012-07-12 ENCOUNTER — Other Ambulatory Visit: Payer: Self-pay | Admitting: Internal Medicine

## 2012-07-12 DIAGNOSIS — R1031 Right lower quadrant pain: Secondary | ICD-10-CM

## 2012-07-12 NOTE — Telephone Encounter (Signed)
Referral made for ultrasound

## 2012-07-12 NOTE — Telephone Encounter (Signed)
Caller: Bridget Romero/Patient; Phone Number: [phone]; Message from caller: She states medicine she was given for her bladder problems helped slightly but not completely, she said MD had mentioned if didn't improve may need U/S.  She is thinking she may need that at this point.  Please advise thanks

## 2012-07-16 ENCOUNTER — Ambulatory Visit: Payer: Self-pay | Admitting: Internal Medicine

## 2012-07-22 ENCOUNTER — Encounter: Payer: Self-pay | Admitting: Internal Medicine

## 2012-07-24 ENCOUNTER — Telehealth: Payer: Self-pay

## 2012-07-24 NOTE — Telephone Encounter (Signed)
Pt notified us results. Pt wants to know how long she should wait for pain to go away completely before calling back; pt feels better, not as much pressure. Pt want to know how large fibroid is.

## 2012-07-24 NOTE — Telephone Encounter (Signed)
The fibroid is about 1.5 inches in diameter--not overly big It may give her some discomfort from time to time I would only recommend referral to gynecologist to consider removal if pain was persistent and bad

## 2012-07-24 NOTE — Telephone Encounter (Signed)
Patient informed. 

## 2012-07-25 ENCOUNTER — Telehealth: Payer: Self-pay

## 2012-07-25 NOTE — Telephone Encounter (Signed)
Patient advised.

## 2012-07-25 NOTE — Telephone Encounter (Signed)
Opened in error

## 2012-07-25 NOTE — Telephone Encounter (Signed)
Pt left v/m was cut off while speaking with nurse asked for call back to [phone].

## 2012-10-15 ENCOUNTER — Other Ambulatory Visit: Payer: Self-pay | Admitting: Internal Medicine

## 2012-10-30 ENCOUNTER — Encounter: Payer: Self-pay | Admitting: Internal Medicine

## 2012-10-30 ENCOUNTER — Ambulatory Visit (INDEPENDENT_AMBULATORY_CARE_PROVIDER_SITE_OTHER): Payer: BC Managed Care – PPO | Admitting: Internal Medicine

## 2012-10-30 VITALS — BP 138/82 | HR 65 | Temp 98.1°F | Ht 66.0 in | Wt 198.5 lb

## 2012-10-30 DIAGNOSIS — Z0000 Encounter for general adult medical examination without abnormal findings: Secondary | ICD-10-CM

## 2012-10-30 DIAGNOSIS — E785 Hyperlipidemia, unspecified: Secondary | ICD-10-CM

## 2012-10-30 DIAGNOSIS — J45909 Unspecified asthma, uncomplicated: Secondary | ICD-10-CM

## 2012-10-30 DIAGNOSIS — G479 Sleep disorder, unspecified: Secondary | ICD-10-CM

## 2012-10-30 DIAGNOSIS — E119 Type 2 diabetes mellitus without complications: Secondary | ICD-10-CM

## 2012-10-30 LAB — CBC WITH DIFFERENTIAL/PLATELET
Basophils Relative: 0.9 % (ref 0.0–3.0)
Eosinophils Absolute: 0.9 10*3/uL — ABNORMAL HIGH (ref 0.0–0.7)
Hemoglobin: 12.1 g/dL (ref 12.0–15.0)
Lymphocytes Relative: 26.6 % (ref 12.0–46.0)
MCHC: 31.4 g/dL (ref 30.0–36.0)
Monocytes Relative: 4 % (ref 3.0–12.0)
Neutro Abs: 4.8 10*3/uL (ref 1.4–7.7)
Neutrophils Relative %: 58 % (ref 43.0–77.0)
RBC: 4.48 Mil/uL (ref 3.87–5.11)
WBC: 8.2 10*3/uL (ref 4.5–10.5)

## 2012-10-30 LAB — HEMOGLOBIN A1C: Hgb A1c MFr Bld: 7.2 % — ABNORMAL HIGH (ref 4.6–6.5)

## 2012-10-30 LAB — HEPATIC FUNCTION PANEL
ALT: 13 U/L (ref 0–35)
Alkaline Phosphatase: 64 U/L (ref 39–117)
Bilirubin, Direct: 0 mg/dL (ref 0.0–0.3)
Total Bilirubin: 0.6 mg/dL (ref 0.3–1.2)

## 2012-10-30 LAB — MICROALBUMIN / CREATININE URINE RATIO
Creatinine,U: 219.2 mg/dL
Microalb, Ur: 0.9 mg/dL (ref 0.0–1.9)

## 2012-10-30 LAB — BASIC METABOLIC PANEL
Calcium: 9.4 mg/dL (ref 8.4–10.5)
Creatinine, Ser: 0.8 mg/dL (ref 0.4–1.2)
GFR: 99.85 mL/min (ref >60.00)
Sodium: 140 mEq/L (ref 135–145)

## 2012-10-30 LAB — LIPID PANEL: HDL: 39.3 mg/dL (ref >39.00)

## 2012-10-30 MED ORDER — TEMAZEPAM 30 MG PO CAPS: 30.0000 mg | ORAL_CAPSULE | Freq: Every evening | ORAL | Status: DC | PRN

## 2012-10-30 NOTE — Assessment & Plan Note (Signed)
Needs to work on fitness mammo next year Declined flu and zostavax

## 2012-10-30 NOTE — Assessment & Plan Note (Signed)
Has been controlled

## 2012-10-30 NOTE — Assessment & Plan Note (Signed)
Lab Results  Component Value Date   LDLCALC 114* 09/22/2011    she is on fish oil and prefers no statin Will recheck and she will work harder on lifestyle

## 2012-10-30 NOTE — Assessment & Plan Note (Signed)
Will restart temazepam Urged her not to use every day

## 2012-10-30 NOTE — Progress Notes (Signed)
Subjective:    Patient ID: Bridget Romero, female    DOB: Mar 25, 1946, 66 y.o.   MRN: [id number]  HPI Here for physical Urine symptoms are better Does get some pressure still if prone---could be from fibroid  Discussed that mammogram can be every 2 years Doesn't want flu or zostavax  Tries to exercise--but not much Discussed walking regularly  Checks sugars bid--discussed going down to daily Usually 130-140 fasting Due for eye exam No hypoglycemic reactions  Current Outpatient Prescriptions on File Prior to Visit  Medication Sig Dispense Refill  . albuterol (PROVENTIL HFA) 108 (90 BASE) MCG/ACT inhaler Inhale 2 puffs into the lungs every 4 (four) hours as needed.        . beclomethasone (QVAR) 80 MCG/ACT inhaler Inhale 2 puffs into the lungs as needed.        . Cholecalciferol (VITAMIN D) 1000 UNITS capsule Take 1,000 Units by mouth daily.        . diclofenac (VOLTAREN) 50 MG EC tablet Take 1 tablet (50 mg total) by mouth 2 (two) times daily as needed. For arthritis pain  60 tablet  11  . fish oil-omega-3 fatty acids 1000 MG capsule Take 1 g by mouth.        . metFORMIN (GLUCOPHAGE) 500 MG tablet take 1 tablet by mouth twice a day  60 tablet  12  . Multiple Vitamin (MULTIVITAMIN) tablet Take 1 tablet by mouth daily.        . temazepam (RESTORIL) 30 MG capsule Take 1 capsule (30 mg total) by mouth at bedtime as needed.  30 capsule  3    No Known Allergies  Past Medical History  Diagnosis Date  . Allergy   . Asthma   . Diabetes mellitus   . Osteoarthritis   . Hyperlipidemia     No past surgical history on file.  Family History  Problem Relation Age of Onset  . Heart disease Mother   . Cancer Father     History   Social History  . Marital Status: Married    Spouse Name: N/A    Number of Children: 2  . Years of Education: N/A   Occupational History  . 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 Smoking status:  Former Games developer  . Smokeless tobacco: Never Used  . Alcohol Use: No  . Drug Use: Not on file  . Sexually Active: Not on file   Other Topics Concern  . Not on file   Social History Narrative  . No narrative on file   Review of Systems  Constitutional: Negative for fatigue and unexpected weight change.       Wears seat belt  HENT: Positive for congestion, rhinorrhea and tinnitus. Negative for hearing loss and dental problem.        Overdue for dentist--upper denture Uses tylenol sinus prn for allergy symptoms---makes her cranky. Now using loratadine with success  Eyes: Negative for visual disturbance.       No diplopia or unilateral vision loss  Respiratory: Negative for cough, chest tightness and shortness of breath.   Cardiovascular: Negative for chest pain, palpitations and leg swelling.  Gastrointestinal: Negative for nausea, vomiting, abdominal pain, constipation and blood in stool.       No heartburn  Genitourinary: Positive for dysuria and difficulty urinating.       Some hormonal symptoms No sex--no problem  Musculoskeletal: Positive for back pain and  arthralgias. Negative for joint swelling.       Occ mild pain--uses tylenol or aleve with success  Skin: Negative for rash.       No suspicious lesions  Neurological: Positive for headaches. Negative for dizziness, syncope, weakness, light-headedness and numbness.       Only rare headache  Hematological: Negative for adenopathy. Does not bruise/bleed easily.  Psychiatric/Behavioral: Positive for sleep disturbance. Negative for dysphoric mood. The patient is not nervous/anxious.        Some sleep problems with sweats. Wants to try temazepam again       Objective:   Physical Exam  Constitutional: She is oriented to person, place, and time. She appears well-developed and well-nourished. No distress.  HENT:  Head: Normocephalic and atraumatic.  Right Ear: External ear normal.  Left Ear: External ear normal.  Mouth/Throat:  Oropharynx is clear and moist. No oropharyngeal exudate.  Eyes: Conjunctivae normal and EOM are normal. Pupils are equal, round, and reactive to light.  Neck: Normal range of motion. Neck supple. No thyromegaly present.  Cardiovascular: Normal rate, regular rhythm, normal heart sounds and intact distal pulses.  Exam reveals no gallop.   No murmur heard. Pulmonary/Chest: Effort normal and breath sounds normal. No respiratory distress. She has no wheezes. She has no rales.  Abdominal: Soft. There is no tenderness.  Genitourinary:       Mild cystic changes in periareolar area bilaterally No worrisome masses  Musculoskeletal: She exhibits no edema and no tenderness.  Lymphadenopathy:    She has no cervical adenopathy.  Neurological: She is alert and oriented to person, place, and time.  Skin: No rash noted. No erythema.  Psychiatric: She has a normal mood and affect. Her behavior is normal. Thought content normal.          Assessment & Plan:

## 2012-10-30 NOTE — Assessment & Plan Note (Signed)
Lab Results  Component Value Date   HGBA1C 6.6* 03/21/2012   Will recheck

## 2012-11-01 ENCOUNTER — Encounter: Payer: Self-pay | Admitting: *Deleted

## 2013-02-27 ENCOUNTER — Telehealth: Payer: Self-pay | Admitting: Internal Medicine

## 2013-02-27 NOTE — Telephone Encounter (Signed)
Please check on her tomorrow 

## 2013-02-27 NOTE — Telephone Encounter (Signed)
Patient Information:  Caller Name: Kyleah  Phone: [phone]  Patient: Bridget Romero, Bridget Romero  Gender: Female  DOB: 1946-07-10  Age: 67 Years  PCP: Tillman Abide [facility])  Office Follow Up:  Does the office need to follow up with this patient?: No  Instructions For The Office: N/A  RN Note:  Patient verbalized understanding of care advice.  Symptoms  Reason For Call & Symptoms: Nasal congestion, sneezing at times, mild headache,  Reviewed Health History In EMR: Yes  Reviewed Medications In EMR: Yes  Reviewed Allergies In EMR: Yes  Reviewed Surgeries / Procedures: Yes  Date of Onset of Symptoms: 02/17/2013  Guideline(s) Used:  Colds  Disposition Per Guideline:   Home Care  Reason For Disposition Reached:   Colds with no complications  Advice Given:  Reassurance  It sounds like an uncomplicated cold that we can treat at home.  Colds are very common and may make you feel uncomfortable.  Colds are caused by viruses, and no medicine or "shot" will cure an uncomplicated cold.  Colds are usually not serious.  Here is some care advice that should help.  Humidifier:  If the air in your home is dry, use a cool-mist humidifier  Treatment for Associated Symptoms of Colds:  For muscle aches, headaches, or moderate fever (more than 101 F or 38.9 C): Take acetaminophen every 4 hours.  Sore throat: Try throat lozenges, hard candy, or warm chicken broth.  Hydrate: Drink adequate liquids.  Contagiousness:  The cold virus is present in your nasal secretions.  Expected Course:   Nasal discharge 7-14 days  Call Back If:  Difficulty breathing occurs  You become worse  Nasal Decongestants for a Very Stuffy or Runny Nose:  Pseudoephedrine (Sudafed) is available OTC in pill form. Typical adult dosage is two 30 mg tablets every 6 hours.  Oxymetazoline Nasal Drops (Afrin) are available OTC. Clean out the nose before using. Spray each nostril once, wait one minute for  absorption, and then spray a second time.

## 2013-03-04 NOTE — Telephone Encounter (Signed)
Spoke with patient and she feels better, just sinus pain

## 2013-03-13 ENCOUNTER — Telehealth: Payer: Self-pay | Admitting: Internal Medicine

## 2013-03-13 NOTE — Telephone Encounter (Signed)
Attempted to return call to patient with no answer.  Unable to leave message due to the line ringging without answering machine or voice mail picking up.

## 2013-03-13 NOTE — Telephone Encounter (Signed)
Tried calling pt again, no answer and no answering machine

## 2013-03-14 ENCOUNTER — Ambulatory Visit (INDEPENDENT_AMBULATORY_CARE_PROVIDER_SITE_OTHER): Payer: BC Managed Care – PPO | Admitting: Internal Medicine

## 2013-03-14 ENCOUNTER — Encounter: Payer: Self-pay | Admitting: Internal Medicine

## 2013-03-14 VITALS — BP 138/80 | HR 79 | Temp 98.6°F | Wt 197.0 lb

## 2013-03-14 DIAGNOSIS — J3489 Other specified disorders of nose and nasal sinuses: Secondary | ICD-10-CM

## 2013-03-14 MED ORDER — AMOXICILLIN 500 MG PO TABS: 1000.0000 mg | ORAL_TABLET | Freq: Two times a day (BID) | ORAL | Status: DC

## 2013-03-14 NOTE — Assessment & Plan Note (Signed)
Not clearly infection but still possible that is what is going on Since she has had this for 2+weeks, will try amoxil

## 2013-03-14 NOTE — Progress Notes (Signed)
  Subjective:    Patient ID: Bridget Romero, female    DOB: 11/27/1946, 67 y.o.   MRN: [id number]  HPI Thinks she has a sinus infection Tightness in maxillary and frontal areas Taking antihistamine and decongestant--may be helping some  Some feeling tired No fever Not much drainage or cough---just feels congested  Symptoms for about 2 weeks  Current Outpatient Prescriptions on File Prior to Visit  Medication Sig Dispense Refill  . albuterol (PROVENTIL HFA) 108 (90 BASE) MCG/ACT inhaler Inhale 2 puffs into the lungs every 4 (four) hours as needed.        . beclomethasone (QVAR) 80 MCG/ACT inhaler Inhale 2 puffs into the lungs as needed.        . Cholecalciferol (VITAMIN D) 1000 UNITS capsule Take 1,000 Units by mouth daily.        . diclofenac (VOLTAREN) 50 MG EC tablet Take 1 tablet (50 mg total) by mouth 2 (two) times daily as needed. For arthritis pain  60 tablet  11  . fish oil-omega-3 fatty acids 1000 MG capsule Take 1 g by mouth.        . metFORMIN (GLUCOPHAGE) 500 MG tablet take 1 tablet by mouth twice a day  60 tablet  12  . Multiple Vitamin (MULTIVITAMIN) tablet Take 1 tablet by mouth daily.        . temazepam (RESTORIL) 30 MG capsule Take 1 capsule (30 mg total) by mouth at bedtime as needed.  30 capsule  1   No current facility-administered medications on file prior to visit.    No Known Allergies  Past Medical History  Diagnosis Date  . Allergy   . Asthma   . Diabetes mellitus   . Osteoarthritis   . Hyperlipidemia     No past surgical history on file.  Family History  Problem Relation Age of Onset  . Heart disease Mother   . Cancer Father     History   Social History  . Marital Status: Married    Spouse Name: N/A    Number of Children: 2  . Years of Education: N/A   Occupational History  . 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 Smoking status: Former Games developer  . Smokeless tobacco: Never Used  .  Alcohol Use: No  . Drug Use: Not on file  . Sexually Active: Not on file   Other Topics Concern  . Not on file   Social History Narrative  . No narrative on file   Review of Systems No new exposures No rash No SOB    Objective:   Physical Exam  Constitutional: She appears well-developed and well-nourished. No distress.  HENT:  Mouth/Throat: Oropharynx is clear and moist. No oropharyngeal exudate.  No sinus tenderness Mild nasal inflammation TMs normal  Neck: Normal range of motion. Neck supple.  Pulmonary/Chest: Effort normal and breath sounds normal. No respiratory distress. She has no wheezes. She has no rales.  Lymphadenopathy:    She has no cervical adenopathy.          Assessment & Plan:

## 2013-04-09 ENCOUNTER — Telehealth: Payer: Self-pay | Admitting: Internal Medicine

## 2013-04-09 NOTE — Telephone Encounter (Signed)
Caller: Bridget Romero/Patient; Phone: [phone]; Reason for Call: Patient is asking for samples of her current inhalers that Dr.  Alphonsus Sias prescribed her in the past.  She states he normally provides her with samples if he has any.  - [phone].   PLEASE CONTACT PATIENT.

## 2013-04-09 NOTE — Telephone Encounter (Signed)
Phone just rang no answering machine and cell number does not except incoming calls

## 2013-04-09 NOTE — Telephone Encounter (Signed)
Let her know we don't have samples Sorry  I really want her to continue the med---hopefully she can afford the prescription for now (send if she needs it)

## 2013-04-11 ENCOUNTER — Telehealth: Payer: Self-pay | Admitting: Internal Medicine

## 2013-04-11 NOTE — Telephone Encounter (Signed)
Patient calling about request for Flovent Inhaler sample.  Had called office on Tues 4/15 to see if office has any samples but had not heard back.  Has been using Flovent only for wheezing at night for the last week and taking Robitussin DM.   Notes slight wheeze when lying down at night when it is quiet only.  No wheeze during the day.  Has been using Flovent inhaler only and thinks this inhaler was given in 2013.  EPIC has note that she had requested sample of inhalers but did not have samples in office and had tried to reach patient.  Triaged in Asthma Adult Guideline - Disposition:  Provide Home Care due to All other situations.   Please review and patient would like Rx called to [facility]. [address]. Patient can be reached at  [phone].

## 2013-04-14 ENCOUNTER — Other Ambulatory Visit: Payer: Self-pay | Admitting: Family Medicine

## 2013-04-14 ENCOUNTER — Telehealth: Payer: Self-pay

## 2013-04-14 MED ORDER — FLUTICASONE PROPIONATE HFA 110 MCG/ACT IN AERO: 2.0000 | INHALATION_SPRAY | Freq: Every day | RESPIRATORY_TRACT | Status: DC

## 2013-04-14 NOTE — Telephone Encounter (Signed)
Okay to send rx for flovent 2 inhalations daily with spacer. Rinse mouth after  #1 x 11

## 2013-04-14 NOTE — Telephone Encounter (Signed)
Triage Record Num: [id number] Operator: Revonda Humphrey Patient Name: Bridget Romero Call Date & Time: 04/11/2013 8:59:20AM Patient Phone: [phone] PCP: Tillman Abide Patient Gender: Female PCP Fax : [phone] Patient DOB: July 10, 1946 Practice Name: [facility] Reason for Call: Patient calling about request for Flovent Inhaler sample. Had called office on Tues 4/15 to see if office has any samples but had not heard back. Has been using Flovent only for wheezing at night for the last week and taking Robitussin DM. Notes slight wheeze when lying down at night when it is quiet only. No wheeze during the day. Has been using Flovent inhaler only and thinks this inhaler was given in 2013. EPIC has note that she had requested sample of inhalers but did not have samples in office and had tried to reach patient. Triaged in Asthma Adult Guideline - Disposition: Provide Home Care due to All other situations. Please review and patient would like Rx called to [facility]. [address]. Patient can be reached at [phone]. Protocol(s) Used: Asthma - Adult Recommended Outcome per Protocol: Provide Home/Self Care Reason for Outcome: All other situations Care Advice: ~

## 2013-04-29 ENCOUNTER — Ambulatory Visit (INDEPENDENT_AMBULATORY_CARE_PROVIDER_SITE_OTHER): Payer: Medicare PPO | Admitting: Internal Medicine

## 2013-04-29 ENCOUNTER — Encounter: Payer: Self-pay | Admitting: Internal Medicine

## 2013-04-29 VITALS — BP 150/80 | HR 78 | Temp 98.3°F | Wt 198.0 lb

## 2013-04-29 DIAGNOSIS — E119 Type 2 diabetes mellitus without complications: Secondary | ICD-10-CM

## 2013-04-29 DIAGNOSIS — G479 Sleep disorder, unspecified: Secondary | ICD-10-CM

## 2013-04-29 DIAGNOSIS — J45909 Unspecified asthma, uncomplicated: Secondary | ICD-10-CM

## 2013-04-29 MED ORDER — MONTELUKAST SODIUM 10 MG PO TABS: 10.0000 mg | ORAL_TABLET | Freq: Every day | ORAL | Status: DC

## 2013-04-29 NOTE — Patient Instructions (Signed)
Please try the montelukast for your asthma and allergies. Let me know if you are still coughing and wheezing.

## 2013-04-29 NOTE — Assessment & Plan Note (Signed)
Mild persistent symptoms of cough and night wheezing  Seems to be pollen related Inhaled steroids help but she can't afford them Will try montelukast

## 2013-04-29 NOTE — Assessment & Plan Note (Signed)
Okay using the sleeping pill at times

## 2013-04-29 NOTE — Assessment & Plan Note (Signed)
Seems to have reasonable control still Will check lab

## 2013-04-29 NOTE — Progress Notes (Signed)
Subjective:    Patient ID: Bridget Romero, female    DOB: 1946/06/25, 67 y.o.   MRN: [id number]  HPI Doing well  Checks sugars bid still AM and before bedtime Discussed doing every other day AM usually 120-140 PM usually 140-160 Had a low sugar reaction when forgot her snack and delayed eating. Got drink and was okay when she was a little shaky  Has had some allergy symptoms in this pollen season Satisfied with control on meds  Asthma mostly controlled Not taking the steroid because she can't afford it Had been using samples but ran out Asthma does seem to be related to allergies  Sleeping better with temazepam---tries to limit use No depression Current Outpatient Prescriptions on File Prior to Visit  Medication Sig Dispense Refill  . beclomethasone (QVAR) 80 MCG/ACT inhaler Inhale 2 puffs into the lungs as needed.        . Cholecalciferol (VITAMIN D) 1000 UNITS capsule Take 1,000 Units by mouth daily.        . diclofenac (VOLTAREN) 50 MG EC tablet Take 1 tablet (50 mg total) by mouth 2 (two) times daily as needed. For arthritis pain  60 tablet  11  . fish oil-omega-3 fatty acids 1000 MG capsule Take 1 g by mouth.        . fluticasone (FLOVENT HFA) 110 MCG/ACT inhaler Inhale 2 puffs into the lungs daily. Rinse mouth after use.  1 Inhaler  11  . metFORMIN (GLUCOPHAGE) 500 MG tablet take 1 tablet by mouth twice a day  60 tablet  12  . Multiple Vitamin (MULTIVITAMIN) tablet Take 1 tablet by mouth daily.        Marland Kitchen PROAIR HFA 108 (90 BASE) MCG/ACT inhaler inhale 2 puffs four times a day if needed as directed  8.5 g  3  . temazepam (RESTORIL) 30 MG capsule Take 1 capsule (30 mg total) by mouth at bedtime as needed.  30 capsule  1   No current facility-administered medications on file prior to visit.    No Known Allergies  Past Medical History  Diagnosis Date  . Allergy   . Asthma   . Diabetes mellitus   . Osteoarthritis   . Hyperlipidemia     No past surgical history on  file.  Family History  Problem Relation Age of Onset  . Heart disease Mother   . Cancer Father     History   Social History  . Marital Status: Married    Spouse Name: N/A    Number of Children: 2  . Years of Education: N/A   Occupational History  . 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 Smoking status: Former Games developer  . Smokeless tobacco: Never Used  . Alcohol Use: No  . Drug Use: Not on file  . Sexually Active: Not on file   Other Topics Concern  . Not on file   Social History Narrative  . No narrative on file   Review of Systems Trying to "take care of myself" Hasn't lost weight as yet    Objective:   Physical Exam  Constitutional: She appears well-developed and well-nourished. No distress.  Neck: Normal range of motion. Neck supple. No thyromegaly present.  Cardiovascular: Normal rate, regular rhythm, normal heart sounds and intact distal pulses.  Exam reveals no gallop.   No murmur heard. Normal pedal pulses  Pulmonary/Chest: Effort normal. No respiratory distress. She has wheezes.  She has no rales.  ?slightly decreased breath sounds Not tight--normal expiration but some insp and exp wheezes  Musculoskeletal: She exhibits no edema and no tenderness.  Lymphadenopathy:    She has no cervical adenopathy.  Psychiatric: She has a normal mood and affect. Her behavior is normal.          Assessment & Plan:

## 2013-04-30 ENCOUNTER — Encounter: Payer: Self-pay | Admitting: *Deleted

## 2013-05-01 ENCOUNTER — Telehealth: Payer: Self-pay | Admitting: Internal Medicine

## 2013-05-01 NOTE — Telephone Encounter (Signed)
I haven't heard of that side effect but certainly possible. It is okay to try it in the morning or to try at night and see if she can do without the temazepam (in case they interacted somehow)

## 2013-05-01 NOTE — Telephone Encounter (Signed)
Caller: Bridget Romero/Patient; Phone: [phone]; Reason for Call: Patient reports she began taking Montelukast last night before bed.  Reports she had very strange dreams and was wondering if the medication could have caused them.  She wasn't sure if she could take her sleeping pills (Temazepam) with this medication, or if she could take the medication in the morning instead of at bedtime.  Please contact her with further instructions.

## 2013-05-02 NOTE — Telephone Encounter (Signed)
Spoke with patient and advised results  she will try it in the morning

## 2013-07-03 ENCOUNTER — Other Ambulatory Visit: Payer: Self-pay

## 2013-07-21 LAB — HM DIABETES EYE EXAM

## 2013-07-25 ENCOUNTER — Other Ambulatory Visit: Payer: Self-pay | Admitting: Internal Medicine

## 2013-07-28 NOTE — Telephone Encounter (Signed)
rx called into pharmacy

## 2013-07-28 NOTE — Telephone Encounter (Signed)
Last filled 10/30/12, [name], please send back to me for call in

## 2013-07-28 NOTE — Telephone Encounter (Signed)
plz phone in. 

## 2013-08-05 ENCOUNTER — Telehealth: Payer: Self-pay

## 2013-08-05 DIAGNOSIS — D259 Leiomyoma of uterus, unspecified: Secondary | ICD-10-CM

## 2013-08-05 NOTE — Telephone Encounter (Signed)
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 Pt has feeling of pressure lower abdomen. No fever, vaginal discharge or bleeding. Pt has started having hot flashes. Pt did not want to schedule appt with Dr Alphonsus Sias but did request referral to GYN.Please advise.

## 2013-08-06 NOTE — Telephone Encounter (Signed)
Appt made with Dr Greggory Keen and patient notified.

## 2013-09-03 ENCOUNTER — Encounter: Payer: Self-pay | Admitting: Internal Medicine

## 2013-11-07 ENCOUNTER — Ambulatory Visit (INDEPENDENT_AMBULATORY_CARE_PROVIDER_SITE_OTHER): Payer: Medicare PPO | Admitting: Internal Medicine

## 2013-11-07 ENCOUNTER — Encounter: Payer: Self-pay | Admitting: Internal Medicine

## 2013-11-07 VITALS — BP 130/80 | HR 72 | Temp 97.9°F | Ht 66.0 in | Wt 195.0 lb

## 2013-11-07 DIAGNOSIS — Z0000 Encounter for general adult medical examination without abnormal findings: Secondary | ICD-10-CM

## 2013-11-07 DIAGNOSIS — J45909 Unspecified asthma, uncomplicated: Secondary | ICD-10-CM

## 2013-11-07 DIAGNOSIS — E785 Hyperlipidemia, unspecified: Secondary | ICD-10-CM

## 2013-11-07 DIAGNOSIS — Z23 Encounter for immunization: Secondary | ICD-10-CM

## 2013-11-07 DIAGNOSIS — E119 Type 2 diabetes mellitus without complications: Secondary | ICD-10-CM

## 2013-11-07 LAB — CBC WITH DIFFERENTIAL/PLATELET
Basophils Relative: 0.4 % (ref 0.0–3.0)
Eosinophils Relative: 7.6 % — ABNORMAL HIGH (ref 0.0–5.0)
HCT: 36.9 % (ref 36.0–46.0)
Lymphs Abs: 2.1 10*3/uL (ref 0.7–4.0)
MCV: 82.7 fl (ref 78.0–100.0)
Monocytes Absolute: 0.3 10*3/uL (ref 0.1–1.0)
Neutro Abs: 4.7 10*3/uL (ref 1.4–7.7)
RBC: 4.46 Mil/uL (ref 3.87–5.11)
WBC: 7.7 10*3/uL (ref 4.5–10.5)

## 2013-11-07 LAB — HEPATIC FUNCTION PANEL
ALT: 11 U/L (ref 0–35)
AST: 15 U/L (ref 0–37)
Albumin: 3.9 g/dL (ref 3.5–5.2)

## 2013-11-07 LAB — T4, FREE: Free T4: 0.91 ng/dL (ref 0.60–1.60)

## 2013-11-07 LAB — BASIC METABOLIC PANEL
BUN: 13 mg/dL (ref 6–23)
Chloride: 103 mEq/L (ref 96–112)
Potassium: 4.1 mEq/L (ref 3.5–5.1)

## 2013-11-07 LAB — LIPID PANEL
Total CHOL/HDL Ratio: 3
Triglycerides: 122 mg/dL (ref 0.0–149.0)

## 2013-11-07 LAB — HEMOGLOBIN A1C: Hgb A1c MFr Bld: 7.5 % — ABNORMAL HIGH (ref 4.6–6.5)

## 2013-11-07 MED ORDER — NYSTATIN 100000 UNIT/GM EX POWD: Freq: Two times a day (BID) | CUTANEOUS | Status: DC

## 2013-11-07 MED ORDER — ATORVASTATIN CALCIUM 10 MG PO TABS: 10.0000 mg | ORAL_TABLET | Freq: Every day | ORAL | Status: DC

## 2013-11-07 MED ORDER — TEMAZEPAM 30 MG PO CAPS: 30.0000 mg | ORAL_CAPSULE | Freq: Every evening | ORAL | Status: DC | PRN

## 2013-11-07 NOTE — Assessment & Plan Note (Signed)
Seems to still have good control Will check labs 

## 2013-11-07 NOTE — Patient Instructions (Addendum)
Please set up your screening mammogram.  Please set up blood work in about 2 months (lipid, hepatic--- 272.4)

## 2013-11-07 NOTE — Assessment & Plan Note (Signed)
Discussed statin She is willing to start

## 2013-11-07 NOTE — Assessment & Plan Note (Signed)
Generally healthy Flu/pneumovax given Didn't want zostavax Due for screening mammo

## 2013-11-07 NOTE — Addendum Note (Signed)
Addended by: Sueanne Margarita on: 11/07/2013 10:23 AM   Modules accepted: Orders

## 2013-11-07 NOTE — Progress Notes (Signed)
Subjective:    Patient ID: Bridget Romero, female    DOB: 1946/04/16, 67 y.o.   MRN: [id number]  HPI Here for physical No new concerns Reviewed advanced directives  Checks sugars bid -- AM and evening Discussed doing this every other day AM usually under 120 Evenings may go up to 180 No hypoglycemic reactions No sores or numbness in feet Tries to exercise  Discussed cholesterol and statin Recommended secondary prevention  Current Outpatient Prescriptions on File Prior to Visit  Medication Sig Dispense Refill  . Cholecalciferol (VITAMIN D) 1000 UNITS capsule Take 1,000 Units by mouth daily.        . diclofenac (VOLTAREN) 50 MG EC tablet Take 1 tablet (50 mg total) by mouth 2 (two) times daily as needed. For arthritis pain  60 tablet  11  . fish oil-omega-3 fatty acids 1000 MG capsule Take 1 g by mouth.        . metFORMIN (GLUCOPHAGE) 500 MG tablet take 1 tablet by mouth twice a day  60 tablet  12  . montelukast (SINGULAIR) 10 MG tablet Take 1 tablet (10 mg total) by mouth at bedtime.  30 tablet  11  . Multiple Vitamin (MULTIVITAMIN) tablet Take 1 tablet by mouth daily.        Marland Kitchen PROAIR HFA 108 (90 BASE) MCG/ACT inhaler inhale 2 puffs four times a day if needed as directed  8.5 g  3  . temazepam (RESTORIL) 30 MG capsule take 1 capsule by mouth at bedtime if needed  30 capsule  1   No current facility-administered medications on file prior to visit.    No Known Allergies  Past Medical History  Diagnosis Date  . Allergy   . Asthma   . Diabetes mellitus   . Osteoarthritis   . Hyperlipidemia     No past surgical history on file.  Family History  Problem Relation Age of Onset  . Heart disease Mother   . Cancer Father     History   Social History  . Marital Status: Married    Spouse Name: N/A    Number of Children: 2  . Years of Education: N/A   Occupational History  . 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  Smoking status: Former Games developer  . Smokeless tobacco: Never Used  . Alcohol Use: No  . Drug Use: Not on file  . Sexual Activity: Not on file   Other Topics Concern  . Not on file   Social History Narrative   No living will   Son and daughter should make health care decisions for her   Would accept resuscitation   Not sure about tube feeds   Review of Systems  Constitutional: Negative for fatigue and unexpected weight change.       Wears seat belt  HENT: Positive for congestion, rhinorrhea and tinnitus. Negative for dental problem and hearing loss.        Overdue for dentist  Eyes: Negative for visual disturbance.       No diplopia or unilateral vision loss  Respiratory: Positive for cough and wheezing. Negative for chest tightness and shortness of breath.        Uses inhaler ~once a week--usually at night  Cardiovascular: Negative for chest pain, palpitations and leg swelling.  Gastrointestinal: Negative for nausea, vomiting, abdominal pain, constipation and blood in stool.       No heartburn  Endocrine: Negative for cold intolerance and heat intolerance.  Genitourinary: Negative for dysuria, hematuria, difficulty urinating and dyspareunia.  Musculoskeletal: Positive for arthralgias and back pain. Negative for joint swelling.       Mild pain-- uses ibuprofen prn-- 2 bid on a bad day  Skin: Positive for rash.       Some rash under breasts--vaseline some help  Allergic/Immunologic: Positive for environmental allergies. Negative for immunocompromised state.       Uses occ OTC med  Neurological: Positive for headaches. Negative for dizziness, syncope, weakness, light-headedness and numbness.       Occ headaches  Psychiatric/Behavioral: Positive for sleep disturbance. Negative for dysphoric mood. The patient is not nervous/anxious.        Chronic sleep problems--- uses temazepam only a few times a month       Objective:   Physical Exam  Constitutional: She is oriented to person,  place, and time. She appears well-developed and well-nourished. No distress.  HENT:  Head: Normocephalic and atraumatic.  Right Ear: External ear normal.  Left Ear: External ear normal.  Mouth/Throat: Oropharynx is clear and moist. No oropharyngeal exudate.  Eyes: Conjunctivae and EOM are normal. Pupils are equal, round, and reactive to light.  Neck: Normal range of motion. Neck supple. No thyromegaly present.  Cardiovascular: Normal rate, regular rhythm, normal heart sounds and intact distal pulses.  Exam reveals no gallop.   No murmur heard. Pulmonary/Chest: Effort normal and breath sounds normal. No respiratory distress. She has no wheezes. She has no rales.  Abdominal: Soft. There is no tenderness.  Musculoskeletal: She exhibits no edema and no tenderness.  Lymphadenopathy:    She has no cervical adenopathy.  Neurological: She is alert and oriented to person, place, and time.  Skin: Rash noted. No erythema.  Mild fungal intertrigo under breasts  Psychiatric: She has a normal mood and affect. Her behavior is normal.          Assessment & Plan:

## 2013-11-07 NOTE — Assessment & Plan Note (Signed)
Controlled with singulair

## 2013-11-07 NOTE — Progress Notes (Signed)
Pre-visit discussion using our clinic review tool. No additional management support is needed unless otherwise documented below in the visit note.  

## 2013-11-10 ENCOUNTER — Encounter: Payer: Self-pay | Admitting: *Deleted

## 2013-11-18 ENCOUNTER — Other Ambulatory Visit: Payer: Self-pay | Admitting: Internal Medicine

## 2013-12-16 ENCOUNTER — Encounter: Payer: Self-pay | Admitting: Family Medicine

## 2013-12-16 ENCOUNTER — Ambulatory Visit (INDEPENDENT_AMBULATORY_CARE_PROVIDER_SITE_OTHER): Payer: Medicare PPO | Admitting: Family Medicine

## 2013-12-16 ENCOUNTER — Telehealth: Payer: Self-pay | Admitting: Internal Medicine

## 2013-12-16 VITALS — BP 140/84 | HR 91 | Temp 98.1°F | Wt 195.5 lb

## 2013-12-16 DIAGNOSIS — M545 Low back pain: Secondary | ICD-10-CM

## 2013-12-16 NOTE — Assessment & Plan Note (Signed)
Likely benign muscle strain, would use ibuprofen and stretching, heat.  D/w pt.  F/u prn.  She agrees.

## 2013-12-16 NOTE — Patient Instructions (Addendum)
Take 400-600mg  (2-3 tabs) of ibuprofen up to 3 times a day with food.   Don't take ibuprofen with diclofenac.  Use the heating pad and stretch your back.  This should get better.   Use the inhaler as needed.  If not improving, then notify us. Take care.

## 2013-12-16 NOTE — Telephone Encounter (Signed)
Patient Information:  Caller Name: Lizet  Phone: [phone]  Patient: Bridget Romero, Bridget Romero  Gender: Female  DOB: [birth year]  Age: 67 Years  PCP: Tillman Abide [facility])  Office Follow Up:  Does the office need to follow up with this patient?: No  Instructions For The Office: N/A  RN Note:  Right flank pain present rated 7/10 with movement. Denies pain in midline of back.   Symptoms  Reason For Call & Symptoms: Lower back pain with movement.  FBS 122 at 0800.  Felt faint briefly when got up this morning so stopped taking Doan's pill  Reviewed Health History In EMR: Yes  Reviewed Medications In EMR: Yes  Reviewed Allergies In EMR: Yes  Reviewed Surgeries / Procedures: Yes  Date of Onset of Symptoms: 12/12/2013  Treatments Tried: Doan's pills.  Treatments Tried Worked: No  Guideline(s) Used:  Back Pain  Flank Pain  Disposition Per Guideline:   See Today in Office  Reason For Disposition Reached:   Moderate pain (e.g., interferes with normal activities or awakens from sleep)  Advice Given:  Activity:  Continue normal activities as much as your pain permits.  Staying active is usually more healing for the back than rest.  Avoid any activities that significantly increase the pain. Avoid heavy lifting and strenuous exercise until completely well. Staying in bed is not needed.  Call Back If:  Fever over 100.5 F (38.1 C)  Burning with urination or blood in urine  Pain lasts over 3 days  You become worse.  Patient Will Follow Care Advice:  YES  Appointment Scheduled:  12/16/2013 14:30:00 Appointment Scheduled Provider:  Crawford Givens Clelia Croft) [facility])

## 2013-12-16 NOTE — Progress Notes (Signed)
Pre-visit discussion using our clinic review tool. No additional management support is needed unless otherwise documented below in the visit note.  R lower back pain. Taking doan's pills prev, but dizzy on the medicine. No L sided pain. Pain started a few days ago. No trauma known. May have pulled a muscle.  No midline pain.  Not as bad up walking around but worse trying to get up from a chair. No rash.  No dysuria.  No fevers.  No diarrhea.  No abd pain. No leg pain.  Heat and ibuprofen helped some.  Had taken diclofenac rarely.    Meds, vitals, and allergies reviewed.   ROS: See HPI.  Otherwise, noncontributory.  nad ncat rrr No inc in wob but exp wheeze noted (hadn't used SABA recently) abd soft Back w/o midline pain, no CVA pain R lower paraspinal muscles ttp  Pain with forward flexion at the waist but no radicular sx.  Gait wnl Strength wnl BLE

## 2013-12-17 ENCOUNTER — Ambulatory Visit: Payer: Medicare PPO | Admitting: Internal Medicine

## 2013-12-17 DIAGNOSIS — Z0289 Encounter for other administrative examinations: Secondary | ICD-10-CM

## 2013-12-29 ENCOUNTER — Other Ambulatory Visit: Payer: Self-pay | Admitting: Internal Medicine

## 2013-12-29 DIAGNOSIS — E785 Hyperlipidemia, unspecified: Secondary | ICD-10-CM

## 2014-01-06 ENCOUNTER — Other Ambulatory Visit (INDEPENDENT_AMBULATORY_CARE_PROVIDER_SITE_OTHER): Payer: Medicare PPO

## 2014-01-06 ENCOUNTER — Encounter: Payer: Self-pay | Admitting: *Deleted

## 2014-01-06 DIAGNOSIS — E119 Type 2 diabetes mellitus without complications: Secondary | ICD-10-CM

## 2014-01-06 DIAGNOSIS — E785 Hyperlipidemia, unspecified: Secondary | ICD-10-CM

## 2014-01-06 LAB — HEPATIC FUNCTION PANEL
ALBUMIN: 3.7 g/dL (ref 3.5–5.2)
ALT: 13 U/L (ref 0–35)
AST: 16 U/L (ref 0–37)
Alkaline Phosphatase: 63 U/L (ref 39–117)
BILIRUBIN DIRECT: 0.1 mg/dL (ref 0.0–0.3)
TOTAL PROTEIN: 7.3 g/dL (ref 6.0–8.3)
Total Bilirubin: 0.8 mg/dL (ref 0.3–1.2)

## 2014-01-06 LAB — LIPID PANEL
CHOLESTEROL: 118 mg/dL (ref 0–200)
HDL: 50.9 mg/dL (ref >39.00)
LDL CALC: 50 mg/dL (ref 0–99)
TRIGLYCERIDES: 88 mg/dL (ref 0.0–149.0)
Total CHOL/HDL Ratio: 2
VLDL: 17.6 mg/dL (ref 0.0–40.0)

## 2014-01-14 ENCOUNTER — Telehealth: Payer: Self-pay

## 2014-01-14 NOTE — Telephone Encounter (Signed)
Pt wanted Dr Silvio Pate to know that [employer] will contact our office for diabetic test strips(pt does not know name of strips but said would be on form); pt test BS three times a day. Pt request cb when form sent back to [employer].

## 2014-01-15 NOTE — Telephone Encounter (Signed)
I have not received any requests yet.

## 2014-01-16 ENCOUNTER — Other Ambulatory Visit: Payer: Self-pay | Admitting: *Deleted

## 2014-01-16 MED ORDER — ACCU-CHEK NANO SMARTVIEW W/DEVICE KIT: PACK | Status: DC

## 2014-01-16 MED ORDER — ACCU-CHEK FASTCLIX LANCETS MISC: Status: DC

## 2014-01-16 MED ORDER — GLUCOSE BLOOD VI STRP: ORAL_STRIP | Status: DC

## 2014-01-16 MED ORDER — BD SWAB SINGLE USE REGULAR PADS: MEDICATED_PAD | Status: AC

## 2014-01-16 NOTE — Telephone Encounter (Signed)
rx sent to pharmacy by e-script  

## 2014-01-30 ENCOUNTER — Ambulatory Visit: Payer: Self-pay | Admitting: Internal Medicine

## 2014-02-02 ENCOUNTER — Telehealth: Payer: Self-pay

## 2014-02-02 ENCOUNTER — Encounter: Payer: Self-pay | Admitting: Internal Medicine

## 2014-02-02 NOTE — Telephone Encounter (Signed)
I don't think we have samples of proair  The atorvastatin can cause some muscle pain. If it is a persistent problem, she should try stopping it and then let me know what happens. We can then try restarting it to be sure it caused the problem, or try a different cholesterol med

## 2014-02-02 NOTE — Telephone Encounter (Signed)
Pt left v/m wanting to know if any samples available for Proair HFA. Pt also wants to know if Atorvastatin could cause pt muscles in lower legs to hurt on and off. Pt started Atorvastatin in Jan 2015; two weeks after started atorvastatin pt has had lower leg pain periodically. No redness, swelling or severe pain in lower legs.pt request cb.

## 2014-02-03 ENCOUNTER — Encounter: Payer: Self-pay | Admitting: Family Medicine

## 2014-02-03 NOTE — Telephone Encounter (Signed)
Spoke with patient and advised results   

## 2014-03-25 ENCOUNTER — Encounter: Payer: Self-pay | Admitting: Internal Medicine

## 2014-03-25 ENCOUNTER — Ambulatory Visit (INDEPENDENT_AMBULATORY_CARE_PROVIDER_SITE_OTHER): Payer: Medicare PPO | Admitting: Internal Medicine

## 2014-03-25 VITALS — BP 140/80 | HR 81 | Temp 98.1°F | Wt 192.0 lb

## 2014-03-25 DIAGNOSIS — R030 Elevated blood-pressure reading, without diagnosis of hypertension: Secondary | ICD-10-CM

## 2014-03-25 DIAGNOSIS — IMO0001 Reserved for inherently not codable concepts without codable children: Secondary | ICD-10-CM

## 2014-03-25 NOTE — Progress Notes (Signed)
Subjective:    Patient ID: Bridget Romero, female    DOB: Jan 06, 1946, 68 y.o.   MRN: [id number]  HPI Has been feeling a little funny in head Checked at [facility] yesterday--- 170/94 Still has feeling in head--- ?sinus  No chest pain No SOB No edema No dizziness or syncope  Current Outpatient Prescriptions on File Prior to Visit  Medication Sig Dispense Refill  . ACCU-CHEK FASTCLIX LANCETS MISC Use to test blood sugar once daily dx: 250.00  100 each  3  . Alcohol Swabs (B-D SINGLE USE SWABS REGULAR) PADS Use to test blood sugar once daily dx: 250.00  100 each  3  . atorvastatin (LIPITOR) 10 MG tablet Take 1 tablet (10 mg total) by mouth daily.  90 tablet  3  . Cholecalciferol (VITAMIN D) 1000 UNITS capsule Take 1,000 Units by mouth daily.        . diclofenac (VOLTAREN) 50 MG EC tablet Take 1 tablet (50 mg total) by mouth 2 (two) times daily as needed. For arthritis pain  60 tablet  11  . fish oil-omega-3 fatty acids 1000 MG capsule Take 1 g by mouth.        Marland Kitchen glucose blood (ACCU-CHEK SMARTVIEW) test strip Use as instructed to test blood sugar once daily dx: 250.00  100 each  3  . metFORMIN (GLUCOPHAGE) 500 MG tablet Take 1 tablet (500 mg total) by mouth 2 (two) times daily with a meal.  180 tablet  3  . montelukast (SINGULAIR) 10 MG tablet Take 1 tablet (10 mg total) by mouth at bedtime.  30 tablet  11  . Multiple Vitamin (MULTIVITAMIN) tablet Take 1 tablet by mouth daily.        Marland Kitchen nystatin (MYCOSTATIN) powder Apply topically 2 (two) times daily.  60 g  5  . PROAIR HFA 108 (90 BASE) MCG/ACT inhaler inhale 2 puffs four times a day if needed as directed  8.5 g  3  . temazepam (RESTORIL) 30 MG capsule Take 1 capsule (30 mg total) by mouth at bedtime as needed for sleep.  30 capsule  0   No current facility-administered medications on file prior to visit.    No Known Allergies  Past Medical History  Diagnosis Date  . Allergy   . Asthma   . Diabetes mellitus   . Osteoarthritis     . Hyperlipidemia     No past surgical history on file.  Family History  Problem Relation Age of Onset  . Heart disease Mother   . Cancer Father     History   Social History  . Marital Status: Married    Spouse Name: N/A    Number of Children: 2  . Years of Education: N/A   Occupational History  . Retired as Training and development officer at [employer]  . Smoking status: Former Research scientist (life sciences)  . Smokeless tobacco: Never Used  . Alcohol Use: No  . Drug Use: Not on file  . Sexual Activity: Not on file   Other Topics Concern  . Not on file   Social History Narrative   No living will   Son and daughter should make health care decisions for her   Would accept resuscitation   Not sure about tube feeds   Review of Systems Sleeping fairly well Appetite is fine No significant emotional issues     Objective:   Physical Exam  Constitutional: She appears well-developed  and well-nourished. No distress.  Neck: Normal range of motion. Neck supple. No thyromegaly present.  Cardiovascular: Normal rate, regular rhythm and normal heart sounds.  Exam reveals no gallop.   No murmur heard. Pulmonary/Chest: Effort normal and breath sounds normal. No respiratory distress. She has no wheezes. She has no rales.  Musculoskeletal: She exhibits no edema and no tenderness.  Lymphadenopathy:    She has no cervical adenopathy.          Assessment & Plan:

## 2014-03-25 NOTE — Progress Notes (Signed)
Pre visit review using our clinic review tool, if applicable. No additional management support is needed unless otherwise documented below in the visit note. 

## 2014-03-25 NOTE — Assessment & Plan Note (Signed)
BP Readings from Last 3 Encounters:  03/25/14 140/80  12/16/13 140/84  11/07/13 130/80   Still fine Repeat 140/82 on right May have head symptoms from allergies Will restart the loratadine Reassured---BP is okay

## 2014-05-07 ENCOUNTER — Telehealth: Payer: Self-pay

## 2014-05-07 NOTE — Telephone Encounter (Signed)
There is no generic You can check with the pharmacist to see if there is an inhaled steroid that is cheaper on her insurance

## 2014-05-07 NOTE — Telephone Encounter (Signed)
Spoke with patient and advised results   

## 2014-05-07 NOTE — Telephone Encounter (Signed)
Pt is using the [employer] inhaler but Flovent HFA inhaler was too expensive and pt request substitute med to [facility]. Please advise.

## 2014-05-08 ENCOUNTER — Other Ambulatory Visit: Payer: Self-pay | Admitting: Internal Medicine

## 2014-05-25 ENCOUNTER — Encounter: Payer: Self-pay | Admitting: Internal Medicine

## 2014-05-25 ENCOUNTER — Ambulatory Visit (INDEPENDENT_AMBULATORY_CARE_PROVIDER_SITE_OTHER): Payer: Medicare PPO | Admitting: Internal Medicine

## 2014-05-25 VITALS — BP 118/70 | HR 81 | Temp 98.4°F | Wt 192.0 lb

## 2014-05-25 DIAGNOSIS — E119 Type 2 diabetes mellitus without complications: Secondary | ICD-10-CM

## 2014-05-25 DIAGNOSIS — E785 Hyperlipidemia, unspecified: Secondary | ICD-10-CM

## 2014-05-25 DIAGNOSIS — J45909 Unspecified asthma, uncomplicated: Secondary | ICD-10-CM

## 2014-05-25 LAB — HM DIABETES FOOT EXAM

## 2014-05-25 LAB — HM DIABETES EYE EXAM

## 2014-05-25 NOTE — Assessment & Plan Note (Signed)
Seems to be under good control Will recheck

## 2014-05-25 NOTE — Progress Notes (Signed)
Pre visit review using our clinic review tool, if applicable. No additional management support is needed unless otherwise documented below in the visit note. 

## 2014-05-25 NOTE — Progress Notes (Signed)
Subjective:    Patient ID: Bridget Romero, female    DOB: Sep 04, 1946, 68 y.o.   MRN: [id number]  HPI Doing well Trying to be careful with diet Avoiding pork, etc  Checks sugars daily Usually 130 in AM---occ up if eats the wrong thing No hypoglycemic spells No trouble with feet-- no numbness or sores  No headaches No chest pain No dizziness or syncope Trying to walk regularly No SOB recently No regular wheezing since back on flovent  Current Outpatient Prescriptions on File Prior to Visit  Medication Sig Dispense Refill  . ACCU-CHEK FASTCLIX LANCETS MISC Use to test blood sugar once daily dx: 250.00  100 each  3  . Alcohol Swabs (B-D SINGLE USE SWABS REGULAR) PADS Use to test blood sugar once daily dx: 250.00  100 each  3  . atorvastatin (LIPITOR) 10 MG tablet Take 1 tablet (10 mg total) by mouth daily.  90 tablet  3  . Cholecalciferol (VITAMIN D) 1000 UNITS capsule Take 1,000 Units by mouth daily.        . diclofenac (VOLTAREN) 50 MG EC tablet Take 1 tablet (50 mg total) by mouth 2 (two) times daily as needed. For arthritis pain  60 tablet  11  . fish oil-omega-3 fatty acids 1000 MG capsule Take 1 g by mouth.        Cristy Friedlander HFA 110 MCG/ACT inhaler inhale 2 puffs INTO THE LUNGS DAILY, RINSE MOUTH AFTER USE.  12 g  11  . glucose blood (ACCU-CHEK SMARTVIEW) test strip Use as instructed to test blood sugar once daily dx: 250.00  100 each  3  . metFORMIN (GLUCOPHAGE) 500 MG tablet Take 1 tablet (500 mg total) by mouth 2 (two) times daily with a meal.  180 tablet  3  . montelukast (SINGULAIR) 10 MG tablet Take 1 tablet (10 mg total) by mouth at bedtime.  30 tablet  11  . Multiple Vitamin (MULTIVITAMIN) tablet Take 1 tablet by mouth daily.        Marland Kitchen nystatin (MYCOSTATIN) powder Apply topically 2 (two) times daily.  60 g  5  . PROAIR HFA 108 (90 BASE) MCG/ACT inhaler inhale 2 puffs four times a day if needed as directed  8.5 g  3  . temazepam (RESTORIL) 30 MG capsule Take 1 capsule  (30 mg total) by mouth at bedtime as needed for sleep.  30 capsule  0   No current facility-administered medications on file prior to visit.    No Known Allergies  Past Medical History  Diagnosis Date  . Allergy   . Asthma   . Diabetes mellitus   . Osteoarthritis   . Hyperlipidemia     No past surgical history on file.  Family History  Problem Relation Age of Onset  . Heart disease Mother   . Cancer Father     History   Social History  . Marital Status: Married    Spouse Name: N/A    Number of Children: 2  . Years of Education: N/A   Occupational History  . Retired as Training and development officer at [employer]  . Smoking status: Former Research scientist (life sciences)  . Smokeless tobacco: Never Used  . Alcohol Use: No  . Drug Use: Not on file  . Sexual Activity: Not on file   Other Topics Concern  . Not on file   Social History Narrative   No living will   Son  and daughter should make health care decisions for her   Would accept resuscitation   Not sure about tube feeds   Review of Systems Sleeping okay Appetite is good Bowels are fine    Objective:   Physical Exam  Constitutional: She appears well-developed and well-nourished. No distress.  Neck: Normal range of motion. Neck supple. No thyromegaly present.  Cardiovascular: Normal rate, regular rhythm, normal heart sounds and intact distal pulses.  Exam reveals no gallop.   No murmur heard. Pulmonary/Chest: Effort normal and breath sounds normal. No respiratory distress. She has no wheezes. She has no rales.  Musculoskeletal: She exhibits no edema and no tenderness.  Lymphadenopathy:    She has no cervical adenopathy.  Neurological:  Normal fine touch sensation in feet  Skin: No rash noted.  No foot lesions  Psychiatric: She has a normal mood and affect. Her behavior is normal.          Assessment & Plan:

## 2014-05-25 NOTE — Assessment & Plan Note (Signed)
Lab Results  Component Value Date   LDLCALC 50 01/06/2014   Doing well on med

## 2014-05-25 NOTE — Assessment & Plan Note (Signed)
Doing well with the flovent Rarely needs the rescue now

## 2014-05-26 LAB — MICROALBUMIN / CREATININE URINE RATIO
CREATININE, U: 191.4 mg/dL
MICROALB UR: 0.4 mg/dL (ref 0.0–1.9)
Microalb Creat Ratio: 0.2 mg/g (ref 0.0–30.0)

## 2014-05-26 LAB — HEMOGLOBIN A1C: Hgb A1c MFr Bld: 8.3 % — ABNORMAL HIGH (ref 4.6–6.5)

## 2014-06-05 ENCOUNTER — Encounter: Payer: Self-pay | Admitting: *Deleted

## 2014-06-11 ENCOUNTER — Telehealth: Payer: Self-pay

## 2014-06-11 MED ORDER — ACCU-CHEK FASTCLIX LANCETS MISC: Status: DC

## 2014-06-11 MED ORDER — GLUCOSE BLOOD VI STRP: ORAL_STRIP | Status: DC

## 2014-06-11 MED ORDER — METFORMIN HCL 850 MG PO TABS: 850.0000 mg | ORAL_TABLET | Freq: Two times a day (BID) | ORAL | Status: DC

## 2014-06-11 NOTE — Telephone Encounter (Signed)
Patient notified

## 2014-06-11 NOTE — Telephone Encounter (Signed)
Pt said Metformin was increased to 1000 mg twice a day 05/2014 result note; pt said causing BS after eating to drop to 80. Pt states 80 is to low for her and it "makes her feel bad". Pt wants to know if can decrease dosage of metformin. Pt also used to take BS twice a day and Dr Silvio Pate advised pt once a day would be OK. Pt request increase cking BS twice a day with new rx for diabetic test strips and lancets with new instructions to [employer]. Pt request cb. Did not change directions for test strips and lancets until approval from provider,

## 2014-06-11 NOTE — Telephone Encounter (Signed)
Let's decrease metformin to 850mg  bid for now (sent to pharmacy). 80s are good range of sugars, hopeful to be able to tolerate better as her body gets used to this new normal sugar range. Ok to check more frequently with uncontrolled diabetes. Sent new scripts in.

## 2014-06-24 ENCOUNTER — Other Ambulatory Visit: Payer: Self-pay | Admitting: Internal Medicine

## 2014-06-24 NOTE — Telephone Encounter (Signed)
Last filled 11/07/13 and last office visit was 05/25/14--please advise

## 2014-06-25 NOTE — Telephone Encounter (Signed)
rx called into pharmacy

## 2014-06-25 NOTE — Telephone Encounter (Signed)
Okay #30 x 0 

## 2014-06-30 ENCOUNTER — Other Ambulatory Visit: Payer: Self-pay

## 2014-06-30 MED ORDER — GLUCOSE BLOOD VI STRP: ORAL_STRIP | Status: DC

## 2014-06-30 NOTE — Telephone Encounter (Signed)
Pt cannot afford test strips at local pharmacy and request test strips sent to [facility]; advised pt done.

## 2014-07-06 ENCOUNTER — Telehealth: Payer: Self-pay

## 2014-07-06 NOTE — Telephone Encounter (Signed)
Pt called back and pt received test strips in mail today from mail order pharmacy. Nothing further needed.

## 2014-07-06 NOTE — Telephone Encounter (Signed)
Pt requesting accuchek test strip samples; advised pt do not have samples of those test strips; pt spoke with [facility] and was told next shipment to pt for test strips will be 07/21/14. Pt asked me if she could get test strips from local pharmacy; spoke with Sharyn Lull at [facility]. Pt can get # 25 of test strips at $25.00 cost to pt (will have to order but will be here next day after 3 PM) have # 50 in stock but cost to pt is $93.00. Pt said too expensive. Can pt have one of the free meters with test strips to carry her until receives mail order shipment?Please advise. Pt request cb.

## 2014-07-17 ENCOUNTER — Telehealth: Payer: Self-pay | Admitting: Internal Medicine

## 2014-07-17 DIAGNOSIS — R102 Pelvic and perineal pain unspecified side: Secondary | ICD-10-CM | POA: Insufficient documentation

## 2014-07-17 NOTE — Telephone Encounter (Signed)
The dr's name is Dr. Hassell Done DeFranceco; ph [phone]

## 2014-07-17 NOTE — Telephone Encounter (Signed)
Pt is experiencing pressure in her pelvic area off and on for quiet a while now.  She says Dr. Joaquim Lai (pt not sure how to pronounce or spell name), her OB-GYN dr told her he thinks it's her bladder causing the the pain and told her to call you for a referral to a urologist. Can you place referral or does she need an apptmt w/you first? Thank you.

## 2014-07-30 NOTE — Telephone Encounter (Signed)
Pt changed to metformin 850 mg in 05/2014 and since then pt experiences diarrhea (loose BM) every other day or at least 2 x a week. Other times pt has normal BMs. Pt has taken some form of metformin since 2004.Pt has no fever, abd or back pain and no mucus or blood in stool. Pt wants to know if Metformin could be decreased to 500 mg or should med be changed. Pt states diarrhea is very uncomfortable. pts FBS ranging from 120-130.Please advise. [facility] [employer].

## 2014-07-30 NOTE — Telephone Encounter (Signed)
See if she is willing to try metformin 500mg  tid before each meal. If so, can fill Rx for 1 year Sometimes the ER version of it is better tolerated--but may cost more See if she wants to try that

## 2014-07-31 NOTE — Telephone Encounter (Signed)
Patient notified. She is going to think about things and call me back with what she would like to do. She wants to check her formulary about the ER version.

## 2014-08-05 NOTE — Telephone Encounter (Signed)
Pt said some one tried to call her last thur or fri 07/30/14 or 07/31/14 and pt wants to know what is needed. Spoke with pt and she did remember speaking with Maudie Mercury on 07/31/14; pt has not decided what to do yet and will cb when decides.

## 2014-08-06 NOTE — Telephone Encounter (Signed)
Spoke with patient and she has decided to stay on what she is currently taking and will call back if she changes her mind. She may discuss changing at her follow up.

## 2014-09-04 ENCOUNTER — Other Ambulatory Visit: Payer: Self-pay | Admitting: Internal Medicine

## 2014-09-04 NOTE — Telephone Encounter (Signed)
rx called into pharmacy

## 2014-09-04 NOTE — Telephone Encounter (Signed)
Okay #30 x 0 

## 2014-09-04 NOTE — Telephone Encounter (Signed)
06/25/14 

## 2014-10-12 ENCOUNTER — Telehealth: Payer: Self-pay

## 2014-10-12 NOTE — Telephone Encounter (Signed)
Have her try metformin ER 750mg  2 at breakfast Let her know this often causes much less, or no, diarrhea  #60 x 11

## 2014-10-12 NOTE — Telephone Encounter (Signed)
Pt left v/m requesting different med to take instead of metformin 850 mg. Pt thinks metformin is causing diarrhea.pt request cb.[address]

## 2014-10-13 NOTE — Telephone Encounter (Signed)
Home number just rang, no answering machine, work number- pt doesn't work there, cell number -is wrong number, will try again later.

## 2014-10-14 NOTE — Telephone Encounter (Signed)
Spoke with patient and advised results, pt wanted to know if she can just take 1 of the 850mg  tablet and see if that works?

## 2014-10-14 NOTE — Telephone Encounter (Signed)
Still no answer

## 2014-10-15 NOTE — Telephone Encounter (Signed)
Spoke with patient and advised results   

## 2014-10-15 NOTE — Telephone Encounter (Signed)
Okay to try that Have her let us know That is a bit of a low dose but if sugars are okay, we can keep it at that level (if she tolerates)

## 2014-10-28 ENCOUNTER — Other Ambulatory Visit: Payer: Self-pay | Admitting: *Deleted

## 2014-10-28 MED ORDER — ATORVASTATIN CALCIUM 10 MG PO TABS: 10.0000 mg | ORAL_TABLET | Freq: Every day | ORAL | Status: DC

## 2014-12-03 ENCOUNTER — Telehealth: Payer: Self-pay

## 2014-12-03 NOTE — Telephone Encounter (Signed)
Pt left v/m; pt taking metformin 850 mg twice a day and pt thinks metformin is causing diarrhea and request different med. Pt request cb. Pt has wellness exam scheduled 12/09/14.Please advise.did not put on adverse reaction list incase wants to change dosage instead of med.[facility] [address]

## 2014-12-03 NOTE — Telephone Encounter (Signed)
Metformin often causes diarrhea but is by far the best medicine for diabetes. Some people tolerate the long acting form better--why don't we try changing to that first  Start metformin ER 500mg   2 tabs before breakfast for 3 days--then 3 tabs before breakfast #90 x 1 for now (and if she tolerates, can send 3 month Rx off)

## 2014-12-04 NOTE — Telephone Encounter (Signed)
Home and work number just rang, no answering machine, will try again later.

## 2014-12-07 MED ORDER — METFORMIN HCL ER 500 MG PO TB24: 1500.0000 mg | ORAL_TABLET | Freq: Every day | ORAL | Status: DC

## 2014-12-07 NOTE — Telephone Encounter (Signed)
Spoke with patient and advised results rx sent to pharmacy by e-script  

## 2014-12-09 ENCOUNTER — Encounter: Payer: Self-pay | Admitting: Internal Medicine

## 2014-12-09 ENCOUNTER — Other Ambulatory Visit: Payer: Self-pay | Admitting: Internal Medicine

## 2014-12-09 ENCOUNTER — Ambulatory Visit (INDEPENDENT_AMBULATORY_CARE_PROVIDER_SITE_OTHER): Payer: Medicare PPO | Admitting: Internal Medicine

## 2014-12-09 VITALS — BP 130/80 | HR 82 | Temp 97.6°F | Ht 66.0 in | Wt 193.0 lb

## 2014-12-09 DIAGNOSIS — M150 Primary generalized (osteo)arthritis: Secondary | ICD-10-CM

## 2014-12-09 DIAGNOSIS — Z7189 Other specified counseling: Secondary | ICD-10-CM

## 2014-12-09 DIAGNOSIS — M159 Polyosteoarthritis, unspecified: Secondary | ICD-10-CM

## 2014-12-09 DIAGNOSIS — E785 Hyperlipidemia, unspecified: Secondary | ICD-10-CM

## 2014-12-09 DIAGNOSIS — G479 Sleep disorder, unspecified: Secondary | ICD-10-CM

## 2014-12-09 DIAGNOSIS — Z23 Encounter for immunization: Secondary | ICD-10-CM

## 2014-12-09 DIAGNOSIS — E119 Type 2 diabetes mellitus without complications: Secondary | ICD-10-CM

## 2014-12-09 DIAGNOSIS — J4520 Mild intermittent asthma, uncomplicated: Secondary | ICD-10-CM

## 2014-12-09 DIAGNOSIS — Z0000 Encounter for general adult medical examination without abnormal findings: Secondary | ICD-10-CM

## 2014-12-09 LAB — LIPID PANEL
CHOLESTEROL: 118 mg/dL (ref 0–200)
HDL: 48.1 mg/dL (ref >39.00)
LDL Cholesterol: 55 mg/dL (ref 0–99)
NonHDL: 69.9
Total CHOL/HDL Ratio: 2
Triglycerides: 73 mg/dL (ref 0.0–149.0)
VLDL: 14.6 mg/dL (ref 0.0–40.0)

## 2014-12-09 LAB — CBC WITH DIFFERENTIAL/PLATELET
Basophils Absolute: 0.1 10*3/uL (ref 0.0–0.1)
Basophils Relative: 0.7 % (ref 0.0–3.0)
EOS PCT: 7.2 % — AB (ref 0.0–5.0)
Eosinophils Absolute: 0.5 10*3/uL (ref 0.0–0.7)
HEMATOCRIT: 36.8 % (ref 36.0–46.0)
Hemoglobin: 11.6 g/dL — ABNORMAL LOW (ref 12.0–15.0)
LYMPHS ABS: 2.1 10*3/uL (ref 0.7–4.0)
Lymphocytes Relative: 28.3 % (ref 12.0–46.0)
MCHC: 31.4 g/dL (ref 30.0–36.0)
MCV: 85.3 fl (ref 78.0–100.0)
Monocytes Absolute: 0.4 10*3/uL (ref 0.1–1.0)
Monocytes Relative: 5.1 % (ref 3.0–12.0)
NEUTROS PCT: 58.7 % (ref 43.0–77.0)
Neutro Abs: 4.4 10*3/uL (ref 1.4–7.7)
PLATELETS: 334 10*3/uL (ref 150.0–400.0)
RBC: 4.32 Mil/uL (ref 3.87–5.11)
RDW: 13.9 % (ref 11.5–15.5)
WBC: 7.5 10*3/uL (ref 4.0–10.5)

## 2014-12-09 LAB — COMPREHENSIVE METABOLIC PANEL
ALT: 24 U/L (ref 0–35)
AST: 22 U/L (ref 0–37)
Albumin: 4 g/dL (ref 3.5–5.2)
Alkaline Phosphatase: 64 U/L (ref 39–117)
BILIRUBIN TOTAL: 0.8 mg/dL (ref 0.2–1.2)
BUN: 17 mg/dL (ref 6–23)
CO2: 27 meq/L (ref 19–32)
Calcium: 9.4 mg/dL (ref 8.4–10.5)
Chloride: 106 mEq/L (ref 96–112)
Creatinine, Ser: 0.8 mg/dL (ref 0.4–1.2)
GFR: 99.2 mL/min (ref >60.00)
GLUCOSE: 124 mg/dL — AB (ref 70–99)
Potassium: 4 mEq/L (ref 3.5–5.1)
SODIUM: 139 meq/L (ref 135–145)
TOTAL PROTEIN: 7.1 g/dL (ref 6.0–8.3)

## 2014-12-09 LAB — MICROALBUMIN / CREATININE URINE RATIO
Creatinine,U: 94.7 mg/dL
MICROALB/CREAT RATIO: 0.4 mg/g (ref 0.0–30.0)
Microalb, Ur: 0.4 mg/dL (ref 0.0–1.9)

## 2014-12-09 LAB — T4, FREE: FREE T4: 1.04 ng/dL (ref 0.60–1.60)

## 2014-12-09 LAB — HM DIABETES FOOT EXAM

## 2014-12-09 LAB — HEMOGLOBIN A1C: Hgb A1c MFr Bld: 7.4 % — ABNORMAL HIGH (ref 4.6–6.5)

## 2014-12-09 MED ORDER — TEMAZEPAM 30 MG PO CAPS: ORAL_CAPSULE | ORAL | Status: DC

## 2014-12-09 MED ORDER — DICLOFENAC SODIUM 50 MG PO TBEC: 50.0000 mg | DELAYED_RELEASE_TABLET | Freq: Two times a day (BID) | ORAL | Status: DC | PRN

## 2014-12-09 NOTE — Assessment & Plan Note (Signed)
I have personally reviewed the Medicare Annual Wellness questionnaire and have noted 1. The patient's medical and social history 2. Their use of alcohol, tobacco or illicit drugs 3. Their current medications and supplements 4. The patient's functional ability including ADL's, fall risks, home safety risks and hearing or visual             impairment. 5. Diet and physical activities 6. Evidence for depression or mood disorders  The patients weight, height, BMI and visual acuity have been recorded in the chart I have made referrals, counseling and provided education to the patient based review of the above and I have provided the pt with a written personalized care plan for preventive services.  I have provided you with a copy of your personalized plan for preventive services. Please take the time to review along with your updated medication list.  Discussed fitness prevnar and flu vaccines today mammo due 2/17. She prefers no breast exam Colonoscopy due 2017

## 2014-12-09 NOTE — Assessment & Plan Note (Signed)
Doing fine on the statin Due for labs

## 2014-12-09 NOTE — Progress Notes (Signed)
Subjective:    Patient ID: Bridget Romero, female    DOB: Nov 12, 1946, 68 y.o.   MRN: [id number]  HPI Here for Medicare wellness and follow up of diabetes and other chronic medical conditions Reviewed form and advanced directives No falls No depression or anhedonia Reviewed other physicians No tobacco or alcohol Only occasional exercise--discussed increasing Vision and hearing okay Independent with instrumental ADLs No sig cognitive problems  Having trouble with the metformin Some loose stools with this Hasn't started the ER form that I prescribed Sugars usually around 120 Keeps up with eye doctor--no retinopathy No foot pain, sores or numbness  Still on statin No myalgias No stomach trouble  Asthma is better Only prn flovent--usually in summer (hot weather?) Rarely needs the albuterol--- usually with change in weather No wheezing No regular cough  Sleeps fairly well If she has 2 bad nights in a row-- will take the temazepam Uses it 2-3 per month  Arthritis is better Has not needed med for this Wants refill for prn use  Current Outpatient Prescriptions on File Prior to Visit  Medication Sig Dispense Refill  . ACCU-CHEK FASTCLIX LANCETS MISC Use to test blood sugar twice daily dx: 250.02 100 each 3  . Alcohol Swabs (B-D SINGLE USE SWABS REGULAR) PADS Use to test blood sugar once daily dx: 250.00 100 each 3  . atorvastatin (LIPITOR) 10 MG tablet Take 1 tablet (10 mg total) by mouth daily. 90 tablet 3  . Cholecalciferol (VITAMIN D) 1000 UNITS capsule Take 1,000 Units by mouth daily.      Marland Kitchen FLOVENT HFA 110 MCG/ACT inhaler inhale 2 puffs INTO THE LUNGS DAILY, RINSE MOUTH AFTER USE. 12 g 11  . glucose blood (ACCU-CHEK SMARTVIEW) test strip Use as instructed to test blood sugar twice daily dx: 250.02 200 each 3  . metFORMIN (GLUCOPHAGE-XR) 500 MG 24 hr tablet Take 3 tablets (1,500 mg total) by mouth daily with breakfast. 90 tablet 1  . Multiple Vitamin (MULTIVITAMIN)  tablet Take 1 tablet by mouth daily.       No current facility-administered medications on file prior to visit.    No Known Allergies  Past Medical History  Diagnosis Date  . Allergy   . Asthma   . Diabetes mellitus   . Osteoarthritis   . Hyperlipidemia     No past surgical history on file.  Family History  Problem Relation Age of Onset  . Heart disease Mother   . Cancer Father     History   Social History  . Marital Status: Married    Spouse Name: N/A    Number of Children: 2  . Years of Education: N/A   Occupational History  . Retired as Training and development officer at [employer]  . Smoking status: Former Research scientist (life sciences)  . Smokeless tobacco: Never Used  . Alcohol Use: No  . Drug Use: Not on file  . Sexual Activity: Not on file   Other Topics Concern  . Not on file   Social History Narrative   No living will   Son and daughter should make health care decisions for her   Would accept resuscitation   Not sure about tube feeds   Review of Systems  Appetite is okay Weight stable Bowels okay No skin problems Teeth okay--- overdue for dentist     Objective:   Physical Exam  Constitutional: She is oriented to person, place, and time. She appears  well-developed and well-nourished. No distress.  HENT:  Mouth/Throat: Oropharynx is clear and moist. No oropharyngeal exudate.  Neck: Normal range of motion. Neck supple. No thyromegaly present.  Cardiovascular: Normal rate, regular rhythm, normal heart sounds and intact distal pulses.  Exam reveals no gallop.   No murmur heard. Pulmonary/Chest: Effort normal and breath sounds normal. No respiratory distress. She has no wheezes. She has no rales.  Abdominal: Soft. There is no tenderness.  Musculoskeletal: She exhibits no edema or tenderness.  Lymphadenopathy:    She has no cervical adenopathy.  Neurological: She is alert and oriented to person, place, and time.  President-- "Obama,  Fortuna, ..... Then Bush" [id number] Recall 2/3  Skin: No rash noted. No erythema.  Psychiatric: She has a normal mood and affect. Her behavior is normal.          Assessment & Plan:

## 2014-12-09 NOTE — Assessment & Plan Note (Signed)
Will have the diclofenac for occasional use

## 2014-12-09 NOTE — Assessment & Plan Note (Signed)
Doing well Rarely needs meds recently flovent for hot weather mostly

## 2014-12-09 NOTE — Assessment & Plan Note (Signed)
Seems to still have good control Hopefully will have less GI problems with ER metformin

## 2014-12-09 NOTE — Progress Notes (Signed)
Pre visit review using our clinic review tool, if applicable. No additional management support is needed unless otherwise documented below in the visit note. 

## 2014-12-09 NOTE — Telephone Encounter (Signed)
Dr. Silvio Pate approved this at pt's OV today. rx called into pharmacy

## 2014-12-09 NOTE — Assessment & Plan Note (Signed)
See social history 

## 2014-12-09 NOTE — Assessment & Plan Note (Signed)
Occasional problems  Uses the temazepam rarely

## 2014-12-09 NOTE — Addendum Note (Signed)
Addended by: Despina Hidden on: 12/09/2014 10:16 AM   Modules accepted: Orders

## 2014-12-10 ENCOUNTER — Telehealth: Payer: Self-pay | Admitting: Internal Medicine

## 2014-12-10 NOTE — Telephone Encounter (Signed)
emmi mailed  °

## 2014-12-14 ENCOUNTER — Encounter: Payer: Self-pay | Admitting: *Deleted

## 2015-01-28 ENCOUNTER — Ambulatory Visit (INDEPENDENT_AMBULATORY_CARE_PROVIDER_SITE_OTHER): Payer: Medicare PPO | Admitting: Internal Medicine

## 2015-01-28 ENCOUNTER — Encounter: Payer: Self-pay | Admitting: Internal Medicine

## 2015-01-28 VITALS — BP 142/82 | HR 84 | Temp 98.3°F | Ht 66.0 in | Wt 195.4 lb

## 2015-01-28 DIAGNOSIS — R351 Nocturia: Secondary | ICD-10-CM

## 2015-01-28 LAB — POCT URINALYSIS DIPSTICK
Bilirubin, UA: NEGATIVE
Blood, UA: NEGATIVE
GLUCOSE UA: NEGATIVE
Ketones, UA: NEGATIVE
LEUKOCYTES UA: NEGATIVE
NITRITE UA: NEGATIVE
PROTEIN UA: NEGATIVE
Spec Grav, UA: 1.025
UROBILINOGEN UA: 4
pH, UA: 6

## 2015-01-28 MED ORDER — OXYBUTYNIN CHLORIDE 5 MG PO TABS: 5.0000 mg | ORAL_TABLET | Freq: Every day | ORAL | Status: DC

## 2015-01-28 NOTE — Progress Notes (Signed)
Pre visit review using our clinic review tool, if applicable. No additional management support is needed unless otherwise documented below in the visit note. 

## 2015-01-28 NOTE — Patient Instructions (Signed)
Please cut the first oxybutynin in half and take 1/2 each of the next 2 nights. If no problems like constipation, dry mouth, confusion, etc--- increase to a full tab. After 2 weeks, if your symptoms aren't better, you can try 2 tabs (10mg ) at a time--but be careful for possible side effects. If it doesn't work at all after 1-2 months, let me know and we can decide about alternatives.

## 2015-01-28 NOTE — Assessment & Plan Note (Addendum)
I am not convinced that the fibroid is the reason for this--though she wonders if it could be pressing on her bladder. There is no pharmacologic Rx if this is the case No edema during day--- may still be normal fluid redistribution Urinalysis is negative Discussed options and she wants to try something since she is not able to sleep well Will try low dose oxybutynin Consider imipramine or other antimuscarinic if needed (or even mirabegron

## 2015-01-28 NOTE — Progress Notes (Signed)
   Subjective:    Patient ID: Bridget Romero, female    DOB: 04-25-1946, 69 y.o.   MRN: [id number]  HPI Here due to urinary problems  She thinks her fibroid is acting up Has had nocturia worsening again Was taking phenazopyridine from Dr Tammi Klippel --but stopped it a while Now worse again for a month  No dysuria No hematuria  Current Outpatient Prescriptions on File Prior to Visit  Medication Sig Dispense Refill  . ACCU-CHEK FASTCLIX LANCETS MISC Use to test blood sugar twice daily dx: 250.02 100 each 3  . albuterol (PROVENTIL HFA;VENTOLIN HFA) 108 (90 BASE) MCG/ACT inhaler Inhale 2 puffs into the lungs every 6 (six) hours as needed for wheezing or shortness of breath.    . Alcohol Swabs (B-D SINGLE USE SWABS REGULAR) PADS Use to test blood sugar once daily dx: 250.00 100 each 3  . atorvastatin (LIPITOR) 10 MG tablet Take 1 tablet (10 mg total) by mouth daily. 90 tablet 3  . Cholecalciferol (VITAMIN D) 1000 UNITS capsule Take 1,000 Units by mouth daily.      . diclofenac (VOLTAREN) 50 MG EC tablet Take 1 tablet (50 mg total) by mouth 2 (two) times daily as needed. For arthritis pain 60 tablet 2  . FLOVENT HFA 110 MCG/ACT inhaler inhale 2 puffs INTO THE LUNGS DAILY, RINSE MOUTH AFTER USE. 12 g 11  . glucose blood (ACCU-CHEK SMARTVIEW) test strip Use as instructed to test blood sugar twice daily dx: 250.02 200 each 3  . metFORMIN (GLUCOPHAGE-XR) 500 MG 24 hr tablet Take 3 tablets (1,500 mg total) by mouth daily with breakfast. 90 tablet 1  . Multiple Vitamin (MULTIVITAMIN) tablet Take 1 tablet by mouth daily.      . temazepam (RESTORIL) 30 MG capsule take 1 capsule by mouth at bedtime if needed for sleep 30 capsule 0   No current facility-administered medications on file prior to visit.    No Known Allergies  Past Medical History  Diagnosis Date  . Allergy   . Asthma   . Diabetes mellitus   . Osteoarthritis   . Hyperlipidemia     No past surgical history on file.  Family  History  Problem Relation Age of Onset  . Heart disease Mother   . Cancer Father     History   Social History  . Marital Status: Married    Spouse Name: N/A    Number of Children: 2  . Years of Education: N/A   Occupational History  . Retired as Training and development officer at [employer]  . Smoking status: Former Research scientist (life sciences)  . Smokeless tobacco: Never Used  . Alcohol Use: No  . Drug Use: Not on file  . Sexual Activity: Not on file   Other Topics Concern  . Not on file   Social History Narrative   No living will   Son and daughter should make health care decisions for her   Would accept resuscitation   Not sure about tube feeds   Review of Systems No N/V No abdominal pain  Appetite is okay No fever    Objective:   Physical Exam  Constitutional: She appears well-developed and well-nourished. No distress.  Abdominal: Soft. She exhibits no distension and no mass. There is no tenderness. There is no rebound and no guarding.          Assessment & Plan:

## 2015-01-31 ENCOUNTER — Other Ambulatory Visit: Payer: Self-pay | Admitting: Internal Medicine

## 2015-04-02 ENCOUNTER — Telehealth: Payer: Self-pay | Admitting: *Deleted

## 2015-04-02 ENCOUNTER — Telehealth: Payer: Self-pay

## 2015-04-02 MED ORDER — IMIPRAMINE HCL 10 MG PO TABS: 10.0000 mg | ORAL_TABLET | Freq: Every day | ORAL | Status: DC

## 2015-04-02 NOTE — Telephone Encounter (Signed)
Received a fax from pharmacy that imipramine requires a prior auth. I called [employer] and requested fax forms. Ref# is [id number]

## 2015-04-02 NOTE — Telephone Encounter (Signed)
Have her stop the oxybutynin Let her know that I sent the other medicine we talked about at the visit to her pharmacy. She should start with 1 at bedtime for 3-4 days, and increase to 2 at bedtime if that doesn't help. If she thinks it is helping some, and no side effects (like AM sedation, dry mouth, confusion or constipation), we may be able to consider increasing the dose a little more

## 2015-04-02 NOTE — Telephone Encounter (Signed)
Pt was seen 01/28/15; oxybutinin is not helping; pt still has pressure lower abd at night; pt gets up at least x 2 every night to urinate. No fever or burning upon urination. Pt request different med to [place] advise.

## 2015-04-02 NOTE — Telephone Encounter (Signed)
Pt advised and she verbalized understanding.

## 2015-04-20 ENCOUNTER — Other Ambulatory Visit: Payer: Self-pay | Admitting: Internal Medicine

## 2015-04-20 ENCOUNTER — Telehealth: Payer: Self-pay | Admitting: *Deleted

## 2015-04-20 NOTE — Telephone Encounter (Signed)
12/09/14 

## 2015-04-20 NOTE — Telephone Encounter (Signed)
Please let her know that if the oxybutynin and imipramine didn't help--I don't think there are any medications likely to help. It is unclear if a procedure for the fibroids would help but consideration for that may be the next step

## 2015-04-20 NOTE — Telephone Encounter (Signed)
Patient is taking imipramine and c/o side effects: nausea, increased sweating, and "feeling funny".  She is requesting a different medication.  Please advise.

## 2015-04-20 NOTE — Telephone Encounter (Signed)
Spoke with patient and she needs to think about the procedure and will discuss in June at her appt

## 2015-04-21 NOTE — Telephone Encounter (Signed)
Approved: 30 x 0 

## 2015-04-21 NOTE — Telephone Encounter (Signed)
rx called into pharmacy

## 2015-04-29 ENCOUNTER — Other Ambulatory Visit: Payer: Self-pay | Admitting: Internal Medicine

## 2015-06-09 ENCOUNTER — Ambulatory Visit (INDEPENDENT_AMBULATORY_CARE_PROVIDER_SITE_OTHER): Payer: Medicare PPO | Admitting: Internal Medicine

## 2015-06-09 ENCOUNTER — Encounter: Payer: Self-pay | Admitting: Internal Medicine

## 2015-06-09 ENCOUNTER — Encounter: Payer: Self-pay | Admitting: *Deleted

## 2015-06-09 VITALS — BP 136/88 | HR 74 | Temp 98.3°F | Wt 192.0 lb

## 2015-06-09 DIAGNOSIS — E119 Type 2 diabetes mellitus without complications: Secondary | ICD-10-CM | POA: Diagnosis not present

## 2015-06-09 DIAGNOSIS — G479 Sleep disorder, unspecified: Secondary | ICD-10-CM | POA: Diagnosis not present

## 2015-06-09 DIAGNOSIS — E785 Hyperlipidemia, unspecified: Secondary | ICD-10-CM

## 2015-06-09 DIAGNOSIS — J4520 Mild intermittent asthma, uncomplicated: Secondary | ICD-10-CM

## 2015-06-09 LAB — HEMOGLOBIN A1C: Hgb A1c MFr Bld: 7.7 % — ABNORMAL HIGH (ref 4.6–6.5)

## 2015-06-09 MED ORDER — TEMAZEPAM 30 MG PO CAPS: 30.0000 mg | ORAL_CAPSULE | Freq: Every evening | ORAL | Status: DC | PRN

## 2015-06-09 MED ORDER — FLUTICASONE PROPIONATE HFA 110 MCG/ACT IN AERO
2.0000 | INHALATION_SPRAY | Freq: Every day | RESPIRATORY_TRACT | Status: DC
Start: 2015-06-09 — End: 2016-06-21

## 2015-06-09 MED ORDER — ALBUTEROL SULFATE HFA 108 (90 BASE) MCG/ACT IN AERS: 2.0000 | INHALATION_SPRAY | Freq: Four times a day (QID) | RESPIRATORY_TRACT | Status: DC | PRN

## 2015-06-09 NOTE — Assessment & Plan Note (Signed)
Discussed sleep hygiene Uses med regularly

## 2015-06-09 NOTE — Patient Instructions (Signed)
Please set up your eye exam. 

## 2015-06-09 NOTE — Assessment & Plan Note (Signed)
Still seems to have reasonable control Due for lab

## 2015-06-09 NOTE — Assessment & Plan Note (Signed)
No problems with the statin Lab Results  Component Value Date   LDLCALC 55 12/09/2014

## 2015-06-09 NOTE — Progress Notes (Signed)
Subjective:    Patient ID: Bridget Romero, female    DOB: [birth year], 69 y.o.   MRN: [id number]  HPI  Here for follow up of diabetes and other chronic medical conditions She feels well  Checks sugars up to twice a day Highest is about 150 but not fasting 120-140 fasting No hypoglycemic reactions Needs eye exam--- she will set up  No headaches No chest pain No SOB No dizziness or syncope Some exercise Occasionally checks BP---last 142/82  Rarely hears a wheeze No regular cough Uses albuterol every 2-3 weeks or so  Current Outpatient Prescriptions on File Prior to Visit  Medication Sig Dispense Refill  . ACCU-CHEK FASTCLIX LANCETS MISC Use to test blood sugar twice daily dx: 250.02 100 each 3  . albuterol (PROVENTIL HFA;VENTOLIN HFA) 108 (90 BASE) MCG/ACT inhaler Inhale 2 puffs into the lungs every 6 (six) hours as needed for wheezing or shortness of breath.    . Alcohol Swabs (B-D SINGLE USE SWABS REGULAR) PADS Use to test blood sugar once daily dx: 250.00 100 each 3  . atorvastatin (LIPITOR) 10 MG tablet Take 1 tablet (10 mg total) by mouth daily. 90 tablet 3  . Cholecalciferol (VITAMIN D) 1000 UNITS capsule Take 1,000 Units by mouth daily.      . diclofenac (VOLTAREN) 50 MG EC tablet Take 1 tablet (50 mg total) by mouth 2 (two) times daily as needed. For arthritis pain 60 tablet 2  . FLOVENT HFA 110 MCG/ACT inhaler inhale 2 puffs INTO THE LUNGS DAILY, RINSE MOUTH AFTER USE. 12 g 11  . glucose blood (ACCU-CHEK SMARTVIEW) test strip Use as instructed to test blood sugar twice daily dx: 250.02 200 each 3  . metFORMIN (GLUCOPHAGE-XR) 500 MG 24 hr tablet take 3 tablets by mouth every morning with BREAKFAST 90 tablet 11  . Multiple Vitamin (MULTIVITAMIN) tablet Take 1 tablet by mouth daily.      . temazepam (RESTORIL) 30 MG capsule take 1 capsule by mouth at bedtime if needed for sleep 30 capsule 0   No current facility-administered medications on file prior to visit.    No  Known Allergies  Past Medical History  Diagnosis Date  . Allergy   . Asthma   . Diabetes mellitus   . Osteoarthritis   . Hyperlipidemia     No past surgical history on file.  Family History  Problem Relation Age of Onset  . Heart disease Mother   . Cancer Father     History   Social History  . Marital Status: Married    Spouse Name: N/A  . Number of Children: 2  . Years of Education: N/A   Occupational History  . Retired as Training and development officer at [employer]  . Smoking status: Former Research scientist (life sciences)  . Smokeless tobacco: Never Used  . Alcohol Use: No  . Drug Use: Not on file  . Sexual Activity: Not on file   Other Topics Concern  . Not on file   Social History Narrative   No living will   Son and daughter should make health care decisions for her   Would accept resuscitation   Not sure about tube feeds   Review of Systems Appetite is fine Weight down a few pounds Sleep is variable--needs the medication regularly     Objective:   Physical Exam  Constitutional: She appears well-developed and well-nourished. No distress.  Neck: Normal range of motion. Neck  supple. No thyromegaly present.  Cardiovascular: Normal rate, regular rhythm, normal heart sounds and intact distal pulses.  Exam reveals no gallop.   No murmur heard. Pulmonary/Chest: Effort normal and breath sounds normal. No respiratory distress. She has no wheezes. She has no rales.  Musculoskeletal: She exhibits no edema or tenderness.  Lymphadenopathy:    She has no cervical adenopathy.  Skin: No rash noted. No erythema.  No foot lesions  Psychiatric: She has a normal mood and affect. Her behavior is normal.          Assessment & Plan:

## 2015-06-09 NOTE — Progress Notes (Signed)
Pre visit review using our clinic review tool, if applicable. No additional management support is needed unless otherwise documented below in the visit note. 

## 2015-06-09 NOTE — Assessment & Plan Note (Signed)
Controlled No limitations Rarely needs inhaler

## 2015-06-23 ENCOUNTER — Telehealth: Payer: Self-pay

## 2015-06-23 NOTE — Telephone Encounter (Signed)
Pt left v/m requesting iron pill due to pt feeling very tired all the time. Pt last seen 06/09/2015. [place].

## 2015-06-24 MED ORDER — FERROUS FUMARATE 324 (106 FE) MG PO TABS: 1.0000 | ORAL_TABLET | Freq: Every day | ORAL | Status: DC

## 2015-06-24 NOTE — Telephone Encounter (Signed)
rx sent to pharmacy by e-script Spoke with patient and advised results   

## 2015-06-24 NOTE — Telephone Encounter (Signed)
Okay to send Rx for hemocyte 1 daily #30 x 2  3 months should be enough to build up iron stores if that is part of the problem

## 2015-07-02 NOTE — Telephone Encounter (Signed)
Her blood count was only borderline so I don't think we need to check soon She should take the iron and then we can check a CBC in 2-3 months

## 2015-07-02 NOTE — Telephone Encounter (Signed)
Spoke with patient and advised results   

## 2015-07-02 NOTE — Telephone Encounter (Signed)
Pt wants to have blood test done now to see how low her blood is. Pt request cb. (last CBC was 12/09/14).

## 2015-07-05 DIAGNOSIS — E119 Type 2 diabetes mellitus without complications: Secondary | ICD-10-CM | POA: Diagnosis not present

## 2015-07-05 LAB — HM DIABETES EYE EXAM

## 2015-07-06 ENCOUNTER — Encounter: Payer: Self-pay | Admitting: Internal Medicine

## 2015-07-27 ENCOUNTER — Other Ambulatory Visit: Payer: Self-pay | Admitting: Internal Medicine

## 2015-09-02 ENCOUNTER — Other Ambulatory Visit: Payer: Self-pay | Admitting: *Deleted

## 2015-09-02 MED ORDER — ACCU-CHEK NANO SMARTVIEW W/DEVICE KIT: PACK | Status: DC

## 2015-09-03 ENCOUNTER — Ambulatory Visit: Payer: Medicare PPO | Admitting: Internal Medicine

## 2015-09-08 ENCOUNTER — Other Ambulatory Visit: Payer: Self-pay | Admitting: Internal Medicine

## 2015-09-08 NOTE — Telephone Encounter (Signed)
Approved: okay #30 x 0 

## 2015-09-08 NOTE — Telephone Encounter (Signed)
06/09/2015 

## 2015-09-08 NOTE — Telephone Encounter (Signed)
rx called into pharmacy

## 2015-10-24 ENCOUNTER — Other Ambulatory Visit: Payer: Self-pay | Admitting: Internal Medicine

## 2015-10-25 ENCOUNTER — Telehealth: Payer: Self-pay | Admitting: Internal Medicine

## 2015-10-25 NOTE — Telephone Encounter (Signed)
Pt made appointment 11/1

## 2015-10-26 ENCOUNTER — Ambulatory Visit: Payer: Medicare PPO | Admitting: Internal Medicine

## 2015-11-01 ENCOUNTER — Telehealth: Payer: Self-pay

## 2015-11-01 MED ORDER — PRAVASTATIN SODIUM 40 MG PO TABS: 40.0000 mg | ORAL_TABLET | Freq: Every day | ORAL | Status: DC

## 2015-11-01 NOTE — Telephone Encounter (Signed)
Pt left v/m requesting to change atorvastatin to different med due to med causing pt to have muscle tightness and pt feels weak. Pt request cb.[facility] [address] Pt last seen 06/09/15.

## 2015-11-01 NOTE — Telephone Encounter (Signed)
Let her know I sent the prescription for pravastatin instead. Stop the atorvastatin (please put on intolerance list) I will check her labs at her upcoming appt in December

## 2015-11-01 NOTE — Telephone Encounter (Signed)
Spoke with patient and advised results   

## 2015-11-25 NOTE — Telephone Encounter (Addendum)
Pt left v/m; pt has been taking pravastatin; muscles are still sore and med is making pt tired. Pt wants different med sent to [facility] [address] Pt request cb.

## 2015-11-25 NOTE — Telephone Encounter (Signed)
Just stay off all cholesterol meds for now Will discuss it at her next visit

## 2015-11-25 NOTE — Telephone Encounter (Signed)
Spoke with patient and advised results   

## 2015-12-15 ENCOUNTER — Encounter: Payer: Medicare PPO | Admitting: Internal Medicine

## 2015-12-22 ENCOUNTER — Encounter: Payer: Self-pay | Admitting: Internal Medicine

## 2015-12-22 ENCOUNTER — Ambulatory Visit (INDEPENDENT_AMBULATORY_CARE_PROVIDER_SITE_OTHER): Payer: Medicare PPO | Admitting: Internal Medicine

## 2015-12-22 VITALS — BP 148/80 | HR 83 | Temp 98.2°F | Ht 66.0 in | Wt 190.0 lb

## 2015-12-22 DIAGNOSIS — E785 Hyperlipidemia, unspecified: Secondary | ICD-10-CM | POA: Diagnosis not present

## 2015-12-22 DIAGNOSIS — Z0000 Encounter for general adult medical examination without abnormal findings: Secondary | ICD-10-CM | POA: Diagnosis not present

## 2015-12-22 DIAGNOSIS — J4520 Mild intermittent asthma, uncomplicated: Secondary | ICD-10-CM | POA: Diagnosis not present

## 2015-12-22 DIAGNOSIS — Z7189 Other specified counseling: Secondary | ICD-10-CM

## 2015-12-22 DIAGNOSIS — E119 Type 2 diabetes mellitus without complications: Secondary | ICD-10-CM

## 2015-12-22 DIAGNOSIS — M545 Low back pain, unspecified: Secondary | ICD-10-CM | POA: Insufficient documentation

## 2015-12-22 LAB — COMPREHENSIVE METABOLIC PANEL
ALK PHOS: 54 U/L (ref 39–117)
ALT: 158 U/L — AB (ref 0–35)
AST: 92 U/L — AB (ref 0–37)
Albumin: 3.8 g/dL (ref 3.5–5.2)
BUN: 14 mg/dL (ref 6–23)
CO2: 29 meq/L (ref 19–32)
Calcium: 9.6 mg/dL (ref 8.4–10.5)
Chloride: 105 mEq/L (ref 96–112)
Creatinine, Ser: 0.56 mg/dL (ref 0.40–1.20)
GFR: 138.54 mL/min (ref >60.00)
GLUCOSE: 103 mg/dL — AB (ref 70–99)
POTASSIUM: 3.9 meq/L (ref 3.5–5.1)
SODIUM: 140 meq/L (ref 135–145)
TOTAL PROTEIN: 6.7 g/dL (ref 6.0–8.3)
Total Bilirubin: 0.4 mg/dL (ref 0.2–1.2)

## 2015-12-22 LAB — LIPID PANEL
CHOL/HDL RATIO: 3
Cholesterol: 166 mg/dL (ref 0–200)
HDL: 53.1 mg/dL (ref >39.00)
LDL CALC: 98 mg/dL (ref 0–99)
NONHDL: 112.9
Triglycerides: 76 mg/dL (ref 0.0–149.0)
VLDL: 15.2 mg/dL (ref 0.0–40.0)

## 2015-12-22 LAB — CBC WITH DIFFERENTIAL/PLATELET
Basophils Absolute: 0 10*3/uL (ref 0.0–0.1)
Basophils Relative: 0.5 % (ref 0.0–3.0)
EOS PCT: 5.5 % — AB (ref 0.0–5.0)
Eosinophils Absolute: 0.5 10*3/uL (ref 0.0–0.7)
HEMATOCRIT: 35.8 % — AB (ref 36.0–46.0)
HEMOGLOBIN: 11.3 g/dL — AB (ref 12.0–15.0)
LYMPHS ABS: 2.2 10*3/uL (ref 0.7–4.0)
Lymphocytes Relative: 27.1 % (ref 12.0–46.0)
MCHC: 31.6 g/dL (ref 30.0–36.0)
MCV: 85.6 fl (ref 78.0–100.0)
MONOS PCT: 4.2 % (ref 3.0–12.0)
Monocytes Absolute: 0.3 10*3/uL (ref 0.1–1.0)
NEUTROS PCT: 62.7 % (ref 43.0–77.0)
Neutro Abs: 5.1 10*3/uL (ref 1.4–7.7)
Platelets: 356 10*3/uL (ref 150.0–400.0)
RBC: 4.19 Mil/uL (ref 3.87–5.11)
RDW: 14.3 % (ref 11.5–15.5)
WBC: 8.2 10*3/uL (ref 4.0–10.5)

## 2015-12-22 LAB — HM DIABETES FOOT EXAM

## 2015-12-22 LAB — HEMOGLOBIN A1C: HEMOGLOBIN A1C: 7.2 % — AB (ref 4.6–6.5)

## 2015-12-22 MED ORDER — TEMAZEPAM 30 MG PO CAPS: ORAL_CAPSULE | ORAL | Status: DC

## 2015-12-22 MED ORDER — TRAMADOL HCL 50 MG PO TABS: 50.0000 mg | ORAL_TABLET | Freq: Three times a day (TID) | ORAL | Status: DC | PRN

## 2015-12-22 NOTE — Progress Notes (Signed)
Pre visit review using our clinic review tool, if applicable. No additional management support is needed unless otherwise documented below in the visit note. 

## 2015-12-22 NOTE — Progress Notes (Signed)
Subjective:    Patient ID: Bridget Romero, female    DOB: [birth year], 69 y.o.   MRN: [id number]  HPI Here for Medicare wellness and follow up of chronic medical conditions Reviewed form and advanced directives Reviewed other doctors No alcohol or tobacco No falls Tries to ride bike occasionally No falls No regular sadness or anhedonia---just grieving for husband who just died Vision is fine. Hearing is okay Independent with instrumental ADLs No apparent cognitive problems  Just lost her husband Doing okay with that  Was having muscle pain with the statin Legs are better since stopping the med and using Co Q 10 Levels had been low This is the second she hasn't tolerated  Has pulled muscle in right low back Noticed it last week--though doesn't remember injury Goes to hip but not leg Taking ibuprofen (400 every 4-6 hours) and heat--this helps some  Checks sugars regularly 104 this AM--- usually 130 or so No numbness, pain or sores in feet Eye exam was fine  Asthma quiet Uses inhaler once in a while No regular cough or wheeze--even with exercise  No other significant arthritis issues  Current Outpatient Prescriptions on File Prior to Visit  Medication Sig Dispense Refill  . ACCU-CHEK FASTCLIX LANCETS MISC Use to test blood sugar twice daily dx: 250.02 100 each 3  . albuterol (PROVENTIL HFA;VENTOLIN HFA) 108 (90 BASE) MCG/ACT inhaler Inhale 2 puffs into the lungs every 6 (six) hours as needed for wheezing or shortness of breath. 2 Inhaler 3  . Alcohol Swabs (B-D SINGLE USE SWABS REGULAR) PADS Use to test blood sugar once daily dx: 250.00 100 each 3  . Cholecalciferol (VITAMIN D) 1000 UNITS capsule Take 1,000 Units by mouth daily.      . fluticasone (FLOVENT HFA) 110 MCG/ACT inhaler Inhale 2 puffs into the lungs daily. 12 g 3  . glucose blood (ACCU-CHEK SMARTVIEW) test strip Use as instructed to test blood once daily dx: E11.9 100 each 3  . metFORMIN (GLUCOPHAGE-XR) 500  MG 24 hr tablet take 3 tablets by mouth every morning with BREAKFAST 90 tablet 11  . Multiple Vitamin (MULTIVITAMIN) tablet Take 1 tablet by mouth daily.      . temazepam (RESTORIL) 30 MG capsule take 1 capsule by mouth at bedtime if needed for sleep 30 capsule 0   No current facility-administered medications on file prior to visit.    Allergies  Allergen Reactions  . Atorvastatin Other (See Comments)    myalgias  . Pravastatin Other (See Comments)    Muscle pain    Past Medical History  Diagnosis Date  . Allergy   . Asthma   . Diabetes mellitus   . Osteoarthritis   . Hyperlipidemia     No past surgical history on file.  Family History  Problem Relation Age of Onset  . Heart disease Mother   . Cancer Father     Social History   Social History  . Marital Status: Widowed    Spouse Name: N/A  . Number of Children: 2  . Years of Education: N/A   Occupational History  . Retired as Training and development officer at [employer]  . Smoking status: Former Research scientist (life sciences)  . Smokeless tobacco: Never Used  . Alcohol Use: No  . Drug Use: Not on file  . Sexual Activity: Not on file   Other Topics Concern  . Not on file   Social History Narrative  No living will   Son and daughter should make health care decisions for her   Would accept resuscitation   Not sure about tube feeds   Review of Systems Generally sleeps poorly---uses the temazepam infrequently (every other day or so) Has cut out caffeine coffee Appetite is fine Weight is stable or just down slightly Wears seat belt Teeth okay--overdue for dentist No skin problems Bowels are fine. No blood No urinary problems--no pain or blood with this No chest pain No palpitations    Objective:   Physical Exam  Constitutional: She is oriented to person, place, and time. She appears well-developed and well-nourished. No distress.  HENT:  Mouth/Throat: Oropharynx is clear and moist. No  oropharyngeal exudate.  Neck: Normal range of motion. Neck supple. No thyromegaly present.  Cardiovascular: Normal rate, regular rhythm, normal heart sounds and intact distal pulses.  Exam reveals no gallop.   No murmur heard. Pulmonary/Chest: Effort normal and breath sounds normal. No respiratory distress. She has no wheezes. She has no rales.  Abdominal: Soft. There is no tenderness.  Musculoskeletal: She exhibits no edema.  No spine tenderness Normal ROM of hips No pain with SLR  Lymphadenopathy:    She has no cervical adenopathy.  Neurological: She is alert and oriented to person, place, and time.  President -- "Hector [place]--- ??" [phone]?? [id number] Recall 3/3  Normal sensation in plantar feet Mild weakness in right hip flexors--otherwise normal leg strength  Skin: No rash noted. No erythema.  No foot lesions  Psychiatric: She has a normal mood and affect. Her behavior is normal.          Assessment & Plan:

## 2015-12-22 NOTE — Patient Instructions (Signed)
Your mammogram is due in February. You can set this up yourself.

## 2015-12-22 NOTE — Assessment & Plan Note (Signed)
I have personally reviewed the Medicare Annual Wellness questionnaire and have noted 1. The patient's medical and social history 2. Their use of alcohol, tobacco or illicit drugs 3. Their current medications and supplements 4. The patient's functional ability including ADL's, fall risks, home safety risks and hearing or visual             impairment. 5. Diet and physical activities 6. Evidence for depression or mood disorders  The patients weight, height, BMI and visual acuity have been recorded in the chart I have made referrals, counseling and provided education to the patient based review of the above and I have provided the pt with a written personalized care plan for preventive services.  I have provided you with a copy of your personalized plan for preventive services. Please take the time to review along with your updated medication list.  Needs mammogram in February 2017 Colonoscopy due 12/17 No zostavax due to cost Prefers no flu shot--got sick with it last year Discussed regular walking

## 2015-12-22 NOTE — Assessment & Plan Note (Signed)
Hopefully still good control Due for labs 

## 2015-12-22 NOTE — Assessment & Plan Note (Signed)
No evidence of disc pathology Consider PT if not improving Small Rx tramadol Ibuprofen/heat

## 2015-12-22 NOTE — Assessment & Plan Note (Signed)
Myalgias with 2 statins--will hold off on further Rx

## 2015-12-22 NOTE — Assessment & Plan Note (Signed)
Fairly quiet  Uses inhaler rarely

## 2015-12-22 NOTE — Assessment & Plan Note (Signed)
See social history Has blank forms 

## 2015-12-23 ENCOUNTER — Other Ambulatory Visit: Payer: Self-pay | Admitting: Internal Medicine

## 2015-12-23 DIAGNOSIS — Z1211 Encounter for screening for malignant neoplasm of colon: Secondary | ICD-10-CM

## 2015-12-23 DIAGNOSIS — R748 Abnormal levels of other serum enzymes: Secondary | ICD-10-CM

## 2015-12-28 ENCOUNTER — Telehealth: Payer: Self-pay

## 2015-12-28 NOTE — Telephone Encounter (Signed)
Pt left v/m requesting med for muscle spasms. No answer and no v/m.

## 2015-12-29 ENCOUNTER — Encounter: Payer: Medicare PPO | Admitting: Internal Medicine

## 2016-01-05 ENCOUNTER — Telehealth: Payer: Self-pay | Admitting: Internal Medicine

## 2016-01-05 NOTE — Telephone Encounter (Signed)
Pt left v/m having back problems, not sure if caused by cholesterol med. Pt feels like lower back is having spasms; like pulled muscle. Pt is taking ibuprofen for pain. Pt is feeling tired. Spoke with pt and she has appt to see Allie Bossier NP on 01/06/16 at 11 AM. If pt condition changes or worsens prior to appt pt will go to UC.pt voiced understanding.

## 2016-01-05 NOTE — Telephone Encounter (Signed)
Error

## 2016-01-06 ENCOUNTER — Ambulatory Visit: Payer: Medicare PPO | Admitting: Primary Care

## 2016-01-12 ENCOUNTER — Ambulatory Visit (INDEPENDENT_AMBULATORY_CARE_PROVIDER_SITE_OTHER): Payer: Medicare Other | Admitting: Internal Medicine

## 2016-01-12 ENCOUNTER — Encounter: Payer: Self-pay | Admitting: Internal Medicine

## 2016-01-12 VITALS — BP 140/80 | HR 104 | Temp 97.6°F | Wt 186.0 lb

## 2016-01-12 DIAGNOSIS — M791 Myalgia, unspecified site: Secondary | ICD-10-CM

## 2016-01-12 DIAGNOSIS — R7989 Other specified abnormal findings of blood chemistry: Secondary | ICD-10-CM | POA: Insufficient documentation

## 2016-01-12 DIAGNOSIS — R945 Abnormal results of liver function studies: Secondary | ICD-10-CM

## 2016-01-12 LAB — HEPATIC FUNCTION PANEL
ALBUMIN: 3.7 g/dL (ref 3.5–5.2)
ALK PHOS: 62 U/L (ref 39–117)
ALT: 149 U/L — AB (ref 0–35)
AST: 75 U/L — AB (ref 0–37)
Bilirubin, Direct: 0.1 mg/dL (ref 0.0–0.3)
TOTAL PROTEIN: 6.9 g/dL (ref 6.0–8.3)
Total Bilirubin: 0.5 mg/dL (ref 0.2–1.2)

## 2016-01-12 NOTE — Progress Notes (Signed)
   Subjective:    Patient ID: Bridget Romero, female    DOB: [birth year], 70 y.o.   MRN: [id number]  HPI Here due to ongoing muscle pain Still has trouble getting up stairs Stiff in back when first getting up--but loosens up Legs are much better Has noticed improvement since last month but ongoing symptoms Done with the Co-Q 10--discussed continuing Still using ibuprofen  Discussed elevated liver tests Needs recheck  Current Outpatient Prescriptions on File Prior to Visit  Medication Sig Dispense Refill  . ACCU-CHEK FASTCLIX LANCETS MISC Use to test blood sugar twice daily dx: 250.02 100 each 3  . albuterol (PROVENTIL HFA;VENTOLIN HFA) 108 (90 BASE) MCG/ACT inhaler Inhale 2 puffs into the lungs every 6 (six) hours as needed for wheezing or shortness of breath. 2 Inhaler 3  . Alcohol Swabs (B-D SINGLE USE SWABS REGULAR) PADS Use to test blood sugar once daily dx: 250.00 100 each 3  . Cholecalciferol (VITAMIN D) 1000 UNITS capsule Take 1,000 Units by mouth daily.      . fluticasone (FLOVENT HFA) 110 MCG/ACT inhaler Inhale 2 puffs into the lungs daily. 12 g 3  . glucose blood (ACCU-CHEK SMARTVIEW) test strip Use as instructed to test blood once daily dx: E11.9 100 each 3  . metFORMIN (GLUCOPHAGE-XR) 500 MG 24 hr tablet take 3 tablets by mouth every morning with BREAKFAST 90 tablet 11  . Multiple Vitamin (MULTIVITAMIN) tablet Take 1 tablet by mouth daily.      . temazepam (RESTORIL) 30 MG capsule take 1 capsule by mouth at bedtime if needed for sleep 30 capsule 0  . traMADol (ULTRAM) 50 MG tablet Take 1 tablet (50 mg total) by mouth 3 (three) times daily as needed. 20 tablet 0   No current facility-administered medications on file prior to visit.    Allergies  Allergen Reactions  . Atorvastatin Other (See Comments)    myalgias  . Pravastatin Other (See Comments)    Muscle pain    Past Medical History  Diagnosis Date  . Allergy   . Asthma   . Diabetes mellitus   .  Osteoarthritis   . Hyperlipidemia     No past surgical history on file.  Family History  Problem Relation Age of Onset  . Heart disease Mother   . Cancer Father     Social History   Social History  . Marital Status: Widowed    Spouse Name: N/A  . Number of Children: 2  . Years of Education: N/A   Occupational History  . Retired as Training and development officer at [employer]  . Smoking status: Former Research scientist (life sciences)  . Smokeless tobacco: Never Used  . Alcohol Use: No  . Drug Use: Not on file  . Sexual Activity: Not on file   Other Topics Concern  . Not on file   Social History Narrative   No living will   Son and daughter should make health care decisions for her   Would accept resuscitation   Not sure about tube feeds   Review of Systems     Objective:   Physical Exam        Assessment & Plan:

## 2016-01-12 NOTE — Assessment & Plan Note (Signed)
Does seem to be statin related Given the severity--I would not use another I would expect this to continue to improve over the next few months

## 2016-01-12 NOTE — Assessment & Plan Note (Signed)
I suspect this is from the statin also Will recheck with hepatitis profile

## 2016-01-12 NOTE — Progress Notes (Signed)
Pre visit review using our clinic review tool, if applicable. No additional management support is needed unless otherwise documented below in the visit note. 

## 2016-01-13 ENCOUNTER — Encounter: Payer: Self-pay | Admitting: *Deleted

## 2016-01-13 LAB — HEPATITIS PANEL, ACUTE
HCV Ab: NEGATIVE
HEP B C IGM: NONREACTIVE
Hep A IgM: NONREACTIVE
Hepatitis B Surface Ag: NEGATIVE

## 2016-01-18 ENCOUNTER — Other Ambulatory Visit: Payer: Self-pay | Admitting: *Deleted

## 2016-01-18 MED ORDER — GLUCOSE BLOOD VI STRP: ORAL_STRIP | Status: DC

## 2016-01-19 ENCOUNTER — Telehealth: Payer: Self-pay | Admitting: *Deleted

## 2016-01-19 NOTE — Telephone Encounter (Signed)
Pt calling stating she's having pain in her legs which is making it hard for her to get out of her car, pt would like something called in for the muscle pain, please advise

## 2016-01-20 MED ORDER — PREDNISONE 20 MG PO TABS: 40.0000 mg | ORAL_TABLET | Freq: Every day | ORAL | Status: DC

## 2016-01-20 NOTE — Telephone Encounter (Signed)
Please let her know I sent a 10 day supply of prednisone to her pharmacy--hopefully that should reduce the inflammation in her muscles. Take 2 tabs daily for 5 days then 1 tab daily for 5 days. Her sugars will probably go up with this--but should then go back down after she stops

## 2016-01-20 NOTE — Telephone Encounter (Signed)
Spoke with patient and advised results   

## 2016-01-21 NOTE — Telephone Encounter (Signed)
Pt left v/m requesting clarification of instructions for prednisone; med bottle instructions were take 2 tabs by mouth daily then 1 daily for 5 days. Spoke with pt and advised take 2 tabs po daily for 5 days and then 1 tab daily for 5 days. Pt voiced understanding.

## 2016-02-07 NOTE — Telephone Encounter (Signed)
Pt started taking prednisone 01/20/16 and took for 10 days; pt has finished med about one week ago and today FBS is 220; pt is taking prescribed meds and watching her diet. Pt wants to know how long will it be before prednisone allows her BS to go back to normal. What can be done to decrease blood sugars.

## 2016-02-07 NOTE — Telephone Encounter (Signed)
It should come down within a few days or a week of stopping the prednisone

## 2016-02-07 NOTE — Telephone Encounter (Signed)
Spoke with patient and advised results   

## 2016-03-01 ENCOUNTER — Telehealth: Payer: Self-pay

## 2016-03-01 ENCOUNTER — Telehealth: Payer: Self-pay | Admitting: Primary Care

## 2016-03-01 ENCOUNTER — Ambulatory Visit (INDEPENDENT_AMBULATORY_CARE_PROVIDER_SITE_OTHER): Payer: Medicare Other | Admitting: Primary Care

## 2016-03-01 VITALS — BP 146/92 | HR 66 | Temp 98.1°F | Ht 66.0 in | Wt 188.4 lb

## 2016-03-01 DIAGNOSIS — I10 Essential (primary) hypertension: Secondary | ICD-10-CM

## 2016-03-01 MED ORDER — HYDROCHLOROTHIAZIDE 12.5 MG PO TABS: 12.5000 mg | ORAL_TABLET | Freq: Every day | ORAL | Status: DC

## 2016-03-01 MED ORDER — LISINOPRIL 10 MG PO TABS: 10.0000 mg | ORAL_TABLET | Freq: Every day | ORAL | Status: DC

## 2016-03-01 MED ORDER — BLOOD PRESSURE CUFF MISC: Status: DC

## 2016-03-01 NOTE — Telephone Encounter (Signed)
Pt left v/m; pt request clarification about which BP med to take;  Lisinopril was sent to pharmacy but [facility] has HCTZ listed to take.  Pt wants to know should pt take lisinopril or HCTZ. Pt request cb.

## 2016-03-01 NOTE — Progress Notes (Signed)
Pre visit review using our clinic review tool, if applicable. No additional management support is needed unless otherwise documented below in the visit note. 

## 2016-03-01 NOTE — Telephone Encounter (Signed)
Pt left v/m;pt seen 03/01/16 and pt thought order for BP monitor was going to be sent to [facility]. BP monitor order is not there. Pt request cb.

## 2016-03-01 NOTE — Telephone Encounter (Signed)
Spoke with patient and clarified to take the lisinopril as she has type 2 diabetes. She verbalized understanding. HCTZ was cancelled earlier today.

## 2016-03-01 NOTE — Assessment & Plan Note (Addendum)
No prior history, strong family history. Also with readings of mostly 160/90's from last several months. Above goal today as her systolic is above 90. Due to co-morbidities will initiate low dose ACE. Discussed possible side effects. Also discussed importance of salt reduction and exercise. Will have her check BP for next 2 weeks and bring to her follow up appointment in 2 weeks. Will complete BMP in 2 weeks.

## 2016-03-01 NOTE — Patient Instructions (Signed)
Your blood pressure is over goal of 150/90 and has been on multiple occasions.  Start HCTZ 12.5 mg for blood pressure. Take 1 tablet by mouth every morning.  Continue to work on improving your diet by eliminating salt and fried foods.  Check your blood pressure daily, around the same time of day, for the next 2 weeks.   Ensure that you have rested for 30 minutes prior to checking your blood pressure. Record your readings and bring them to your next visit.  Follow up in 2 weeks for re-evaluation.  It was a pleasure meeting you!  Hypertension Hypertension, commonly called high blood pressure, is when the force of blood pumping through your arteries is too strong. Your arteries are the blood vessels that carry blood from your heart throughout your body. A blood pressure reading consists of a higher number over a lower number, such as 110/72. The higher number (systolic) is the pressure inside your arteries when your heart pumps. The lower number (diastolic) is the pressure inside your arteries when your heart relaxes. Ideally you want your blood pressure below 120/80. Hypertension forces your heart to work harder to pump blood. Your arteries may become narrow or stiff. Having untreated or uncontrolled hypertension can cause heart attack, stroke, kidney disease, and other problems. RISK FACTORS Some risk factors for high blood pressure are controllable. Others are not.  Risk factors you cannot control include:   Race. You may be at higher risk if you are African American.  Age. Risk increases with age.  Gender. Men are at higher risk than women before age 80 years. After age 38, women are at higher risk than men. Risk factors you can control include:  Not getting enough exercise or physical activity.  Being overweight.  Getting too much fat, sugar, calories, or salt in your diet.  Drinking too much alcohol. SIGNS AND SYMPTOMS Hypertension does not usually cause signs or symptoms.  Extremely high blood pressure (hypertensive crisis) may cause headache, anxiety, shortness of breath, and nosebleed. DIAGNOSIS To check if you have hypertension, your health care provider will measure your blood pressure while you are seated, with your arm held at the level of your heart. It should be measured at least twice using the same arm. Certain conditions can cause a difference in blood pressure between your right and left arms. A blood pressure reading that is higher than normal on one occasion does not mean that you need treatment. If it is not clear whether you have high blood pressure, you may be asked to return on a different day to have your blood pressure checked again. Or, you may be asked to monitor your blood pressure at home for 1 or more weeks. TREATMENT Treating high blood pressure includes making lifestyle changes and possibly taking medicine. Living a healthy lifestyle can help lower high blood pressure. You may need to change some of your habits. Lifestyle changes may include:  Following the DASH diet. This diet is high in fruits, vegetables, and whole grains. It is low in salt, red meat, and added sugars.  Keep your sodium intake below 2,300 mg per day.  Getting at least 30-45 minutes of aerobic exercise at least 4 times per week.  Losing weight if necessary.  Not smoking.  Limiting alcoholic beverages.  Learning ways to reduce stress. Your health care provider may prescribe medicine if lifestyle changes are not enough to get your blood pressure under control, and if one of the following is true:  You  are 64-84 years of age and your systolic blood pressure is above 140.  You are 40 years of age or older, and your systolic blood pressure is above 150.  Your diastolic blood pressure is above 90.  You have diabetes, and your systolic blood pressure is over [id number] or your diastolic blood pressure is over 90.  You have kidney disease and your blood pressure is above  140/90.  You have heart disease and your blood pressure is above 140/90. Your personal target blood pressure may vary depending on your medical conditions, your age, and other factors. HOME CARE INSTRUCTIONS  Have your blood pressure rechecked as directed by your health care provider.   Take medicines only as directed by your health care provider. Follow the directions carefully. Blood pressure medicines must be taken as prescribed. The medicine does not work as well when you skip doses. Skipping doses also puts you at risk for problems.  Do not smoke.   Monitor your blood pressure at home as directed by your health care provider. SEEK MEDICAL CARE IF:   You think you are having a reaction to medicines taken.  You have recurrent headaches or feel dizzy.  You have swelling in your ankles.  You have trouble with your vision. SEEK IMMEDIATE MEDICAL CARE IF:  You develop a severe headache or confusion.  You have unusual weakness, numbness, or feel faint.  You have severe chest or abdominal pain.  You vomit repeatedly.  You have trouble breathing. MAKE SURE YOU:   Understand these instructions.  Will watch your condition.  Will get help right away if you are not doing well or get worse.   This information is not intended to replace advice given to you by your health care provider. Make sure you discuss any questions you have with your health care provider.   Document Released: 12/11/2005 Document Revised: 04/27/2015 Document Reviewed: 10/03/2013 Elsevier Interactive Patient Education [employer].

## 2016-03-01 NOTE — Progress Notes (Signed)
Subjective:    Patient ID: Bridget Romero, female    DOB: October 16, 1946, 70 y.o.   MRN: [id number]  HPI  Bridget Romero is a 70 year old female who presents today with a chief complaint of elevated blood pressure. She has a history of type 2 diabetes and hyperlipidemia, no prior history of hypertension. She is very concerned today regarding her readings and would like to discuss initiation of treatment.   She was told on numerous occassions in the past that her blood pressure was elevated. She has been checking her blood pressure infrequently at her local pharmacy since her last visit in January and has been getting readings of 140's-160's/90's, mostly 160's/90's. Strong FH of hypertension. Denies dizziness, chest pain, headaches, lower extremity swelling.   She endorses a healthy diet with low sodium most of the time.  Her diet currently consists of: Breakfast: Oatmeal, bacon, sausage, cereal Lunch: [place] sandwich, hamburger, some fast food Dinner: Best boy, fried fish, baked pork chops Snack: Peanuts, yogurt, popcorn, chips Desserts: Often  Beverages: Water, green tea, coffee  Review of Systems  Respiratory: Negative for cough and shortness of breath.   Cardiovascular: Negative for chest pain and leg swelling.  Neurological: Negative for dizziness, light-headedness and headaches.       Past Medical History  Diagnosis Date  . Allergy   . Asthma   . Diabetes mellitus   . Osteoarthritis   . Hyperlipidemia     Social History   Social History  . Marital Status: Widowed    Spouse Name: N/A  . Number of Children: 2  . Years of Education: N/A   Occupational History  . Retired as Training and development officer at [employer]  . Smoking status: Former Research scientist (life sciences)  . Smokeless tobacco: Never Used  . Alcohol Use: No  . Drug Use: Not on file  . Sexual Activity: Not on file   Other Topics Concern  . Not on file   Social History Narrative   No  living will   Son and daughter should make health care decisions for her   Would accept resuscitation   Not sure about tube feeds    No past surgical history on file.  Family History  Problem Relation Age of Onset  . Heart disease Mother   . Cancer Father     Allergies  Allergen Reactions  . Atorvastatin Other (See Comments)    myalgias  . Pravastatin Other (See Comments)    Muscle pain    Current Outpatient Prescriptions on File Prior to Visit  Medication Sig Dispense Refill  . ACCU-CHEK FASTCLIX LANCETS MISC Use to test blood sugar twice daily dx: 250.02 100 each 3  . albuterol (PROVENTIL HFA;VENTOLIN HFA) 108 (90 BASE) MCG/ACT inhaler Inhale 2 puffs into the lungs every 6 (six) hours as needed for wheezing or shortness of breath. 2 Inhaler 3  . Alcohol Swabs (B-D SINGLE USE SWABS REGULAR) PADS Use to test blood sugar once daily dx: 250.00 100 each 3  . Cholecalciferol (VITAMIN D) 1000 UNITS capsule Take 1,000 Units by mouth daily.      . fluticasone (FLOVENT HFA) 110 MCG/ACT inhaler Inhale 2 puffs into the lungs daily. 12 g 3  . glucose blood (ACCU-CHEK SMARTVIEW) test strip Use as instructed to test blood once daily dx: E11.9 100 each 3  . metFORMIN (GLUCOPHAGE-XR) 500 MG 24 hr tablet take 3 tablets by mouth every morning with  BREAKFAST 90 tablet 11  . Multiple Vitamin (MULTIVITAMIN) tablet Take 1 tablet by mouth daily.      . temazepam (RESTORIL) 30 MG capsule take 1 capsule by mouth at bedtime if needed for sleep 30 capsule 0   No current facility-administered medications on file prior to visit.    BP 146/92 mmHg  Pulse 66  Temp(Src) 98.1 F (36.7 C) (Oral)  Ht 5\' 6"  (1.676 m)  Wt 188 lb 6.4 oz (85.458 kg)  BMI 30.42 kg/m2  SpO2 99%    Objective:   Physical Exam  Constitutional: She appears well-nourished.  Cardiovascular: Normal rate and regular rhythm.   Pulmonary/Chest: Effort normal and breath sounds normal.  Skin: Skin is warm and dry.            Assessment & Plan:

## 2016-03-07 ENCOUNTER — Ambulatory Visit
Admission: RE | Admit: 2016-03-07 | Discharge: 2016-03-07 | Disposition: A | Discharge status: Home / Self Care | Payer: Medicare Other | Source: Ambulatory Visit | Attending: Gastroenterology | Admitting: Gastroenterology

## 2016-03-07 ENCOUNTER — Encounter
Admission: RE | Disposition: A | Discharge status: Home / Self Care | Payer: Self-pay | Source: Ambulatory Visit | Attending: Gastroenterology

## 2016-03-07 ENCOUNTER — Encounter: Payer: Self-pay | Admitting: *Deleted

## 2016-03-07 ENCOUNTER — Ambulatory Visit: Payer: Medicare Other | Admitting: Anesthesiology

## 2016-03-07 DIAGNOSIS — E785 Hyperlipidemia, unspecified: Secondary | ICD-10-CM | POA: Diagnosis not present

## 2016-03-07 DIAGNOSIS — Z7984 Long term (current) use of oral hypoglycemic drugs: Secondary | ICD-10-CM | POA: Insufficient documentation

## 2016-03-07 DIAGNOSIS — Z79899 Other long term (current) drug therapy: Secondary | ICD-10-CM | POA: Diagnosis not present

## 2016-03-07 DIAGNOSIS — E119 Type 2 diabetes mellitus without complications: Secondary | ICD-10-CM | POA: Insufficient documentation

## 2016-03-07 DIAGNOSIS — J45909 Unspecified asthma, uncomplicated: Secondary | ICD-10-CM | POA: Diagnosis not present

## 2016-03-07 DIAGNOSIS — M1990 Unspecified osteoarthritis, unspecified site: Secondary | ICD-10-CM | POA: Diagnosis not present

## 2016-03-07 DIAGNOSIS — K640 First degree hemorrhoids: Secondary | ICD-10-CM | POA: Diagnosis not present

## 2016-03-07 DIAGNOSIS — Z7951 Long term (current) use of inhaled steroids: Secondary | ICD-10-CM | POA: Diagnosis not present

## 2016-03-07 DIAGNOSIS — Z1211 Encounter for screening for malignant neoplasm of colon: Secondary | ICD-10-CM | POA: Insufficient documentation

## 2016-03-07 DIAGNOSIS — Z888 Allergy status to other drugs, medicaments and biological substances status: Secondary | ICD-10-CM | POA: Diagnosis not present

## 2016-03-07 HISTORY — PX: COLONOSCOPY WITH PROPOFOL: SHX5780

## 2016-03-07 LAB — CBC
HEMATOCRIT: 35.9 % (ref 35.0–47.0)
Hemoglobin: 11.8 g/dL — ABNORMAL LOW (ref 12.0–16.0)
MCH: 27.5 pg (ref 26.0–34.0)
MCHC: 33 g/dL (ref 32.0–36.0)
MCV: 83.3 fL (ref 80.0–100.0)
Platelets: 313 10*3/uL (ref 150–440)
RBC: 4.31 MIL/uL (ref 3.80–5.20)
RDW: 13.6 % (ref 11.5–14.5)
WBC: 6 10*3/uL (ref 3.6–11.0)

## 2016-03-07 LAB — PROTIME-INR
INR: 1.01
Prothrombin Time: 13.5 seconds (ref 11.4–15.0)

## 2016-03-07 LAB — GLUCOSE, CAPILLARY: Glucose-Capillary: 143 mg/dL — ABNORMAL HIGH (ref 65–99)

## 2016-03-07 SURGERY — COLONOSCOPY WITH PROPOFOL
Anesthesia: General

## 2016-03-07 MED ORDER — SODIUM CHLORIDE 0.9 % IV SOLN
INTRAVENOUS | Status: DC
  Administered 2016-03-07: 09:00:00 via INTRAVENOUS

## 2016-03-07 MED ORDER — MIDAZOLAM HCL 5 MG/5ML IJ SOLN
INTRAMUSCULAR | Status: DC | PRN
  Administered 2016-03-07: 1 mg via INTRAVENOUS

## 2016-03-07 MED ORDER — PROPOFOL 10 MG/ML IV BOLUS
INTRAVENOUS | Status: DC | PRN
  Administered 2016-03-07: 80 mg via INTRAVENOUS
  Administered 2016-03-07: 17 mg via INTRAVENOUS

## 2016-03-07 MED ORDER — SODIUM CHLORIDE 0.9 % IV SOLN: INTRAVENOUS | Status: DC

## 2016-03-07 MED ORDER — PROPOFOL 500 MG/50ML IV EMUL
INTRAVENOUS | Status: DC | PRN
  Administered 2016-03-07: 100 ug/kg/min via INTRAVENOUS

## 2016-03-07 MED ORDER — FENTANYL CITRATE (PF) 100 MCG/2ML IJ SOLN
INTRAMUSCULAR | Status: DC | PRN
  Administered 2016-03-07: 50 ug via INTRAVENOUS

## 2016-03-07 MED ORDER — LIDOCAINE HCL (CARDIAC) 20 MG/ML IV SOLN
INTRAVENOUS | Status: DC | PRN
  Administered 2016-03-07: 100 mg via INTRAVENOUS

## 2016-03-07 MED ORDER — PHENYLEPHRINE HCL 10 MG/ML IJ SOLN
INTRAMUSCULAR | Status: DC | PRN
  Administered 2016-03-07 (×2): 100 ug via INTRAVENOUS
  Administered 2016-03-07: 200 ug via INTRAVENOUS
  Administered 2016-03-07 (×2): 100 ug via INTRAVENOUS

## 2016-03-07 NOTE — Op Note (Signed)
[facility] Gastroenterology Patient Name: Bridget Romero Procedure Date: 03/07/2016 9:47 AM MRN: [id number] Account #: [id number] Date of Birth: 1946-02-06 Admit Type: Outpatient Age: 70 Room: [facility] ENDO ROOM 3 Gender: Female Note Status: Finalized Procedure:            Colonoscopy Indications:          Screening for colorectal malignant neoplasm Providers:            Lollie Sails, MD Referring MD:         [name] (Referring MD) Medicines:            Monitored Anesthesia Care Complications:        No immediate complications. Procedure:            Pre-Anesthesia Assessment:                       - ASA Grade Assessment: II - A patient with mild                        systemic disease.                       After obtaining informed consent, the colonoscope was                        passed under direct vision. Throughout the procedure,                        the patient's blood pressure, pulse, and oxygen                        saturations were monitored continuously. The                        Colonoscope was introduced through the anus and                        advanced to the the cecum, identified by appendiceal                        orifice and ileocecal valve. The colonoscopy was                        performed without difficulty. The patient tolerated the                        procedure well. The quality of the bowel preparation                        was good. Findings:      Non-bleeding internal hemorrhoids were found during retroflexion and       during anoscopy. The hemorrhoids were small and Grade I (internal       hemorrhoids that do not prolapse).      The digital rectal exam was normal.      The entire examined colon appeared normal. Impression:           - Non-bleeding internal hemorrhoids.                       - The entire examined colon is normal.                       -  No specimens collected. Recommendation:       - Repeat  colonoscopy in 10 years for screening purposes. Procedure Code(s):    --- Professional ---                       [phone], Colonoscopy, flexible; diagnostic, including                        collection of specimen(s) by brushing or washing, when                        performed (separate procedure) Diagnosis Code(s):    --- Professional ---                       Z12.11, Encounter for screening for malignant neoplasm                        of colon                       K64.0, First degree hemorrhoids CPT copyright 2016 American Medical Association. All rights reserved. The codes documented in this report are preliminary and upon coder review may  be revised to meet current compliance requirements. Lollie Sails, MD 03/07/2016 10:14:02 AM This report has been signed electronically. Number of Addenda: 0 Note Initiated On: 03/07/2016 9:47 AM Scope Withdrawal Time: 0 hours 7 minutes 0 seconds  Total Procedure Duration: 0 hours 17 minutes 35 seconds       [facility]

## 2016-03-07 NOTE — H&P (Signed)
Outpatient short stay form Pre-procedure 03/07/2016 9:43 AM Bridget Sails MD  Primary Physician: Dr. Silvio Pate  Reason for visit:  Colonoscopy  History of present illness:  Patient is a 70 year old female presenting today for colonoscopy for screening purposes. Her last colonoscopy was in 2007 with no polyps at that time. There is no family history of colon cancer colon polyps. She tolerated her prep well. She takes no aspirin or blood thinning products.    Current facility-administered medications:  .  0.9 %  sodium chloride infusion, , Intravenous, Continuous, Bridget Sails, MD, Last Rate: 20 mL/hr at 03/07/16 0912 .  0.9 %  sodium chloride infusion, , Intravenous, Continuous, Bridget Sails, MD  Prescriptions prior to admission  Medication Sig Dispense Refill Last Dose  . lisinopril (PRINIVIL,ZESTRIL) 10 MG tablet Take 1 tablet (10 mg total) by mouth daily. 30 tablet 3 03/07/2016 at 0400  . ACCU-CHEK FASTCLIX LANCETS MISC Use to test blood sugar twice daily dx: 250.02 100 each 3 Taking  . albuterol (PROVENTIL HFA;VENTOLIN HFA) 108 (90 BASE) MCG/ACT inhaler Inhale 2 puffs into the lungs every 6 (six) hours as needed for wheezing or shortness of breath. 2 Inhaler 3 Taking  . Alcohol Swabs (B-D SINGLE USE SWABS REGULAR) PADS Use to test blood sugar once daily dx: 250.00 100 each 3 Taking  . Blood Pressure Monitoring (BLOOD PRESSURE CUFF) MISC Measure blood pressure once daily as directed. 1 each 0   . Cholecalciferol (VITAMIN D) 1000 UNITS capsule Take 1,000 Units by mouth daily.     Taking  . fluticasone (FLOVENT HFA) 110 MCG/ACT inhaler Inhale 2 puffs into the lungs daily. 12 g 3 Taking  . glucose blood (ACCU-CHEK SMARTVIEW) test strip Use as instructed to test blood once daily dx: E11.9 100 each 3 Taking  . metFORMIN (GLUCOPHAGE-XR) 500 MG 24 hr tablet take 3 tablets by mouth every morning with BREAKFAST 90 tablet 11 Taking  . Multiple Vitamin (MULTIVITAMIN) tablet Take 1 tablet  by mouth daily.     Taking  . temazepam (RESTORIL) 30 MG capsule take 1 capsule by mouth at bedtime if needed for sleep 30 capsule 0 Taking     Allergies  Allergen Reactions  . Atorvastatin Other (See Comments)    myalgias  . Pravastatin Other (See Comments)    Muscle pain     Past Medical History  Diagnosis Date  . Allergy   . Asthma   . Diabetes mellitus   . Osteoarthritis   . Hyperlipidemia     Review of systems:      Physical Exam    Heart and lungs: Regular rate and rhythm without rub or gallop, lungs are bilaterally clear.    HEENT: Normocephalic atraumatic eyes are anicteric    Other:     Pertinant exam for procedure: Soft nontender nondistended bowel sounds positive normoactive.    Planned proceedures: Colonoscopy and indicated procedures. I have discussed the risks benefits and complications of procedures to include not limited to bleeding, infection, perforation and the risk of sedation and the patient wishes to proceed.    Bridget Sails, MD Gastroenterology 03/07/2016  9:43 AM

## 2016-03-07 NOTE — Anesthesia Postprocedure Evaluation (Signed)
Anesthesia Post Note  Patient: Bridget Romero  Procedure(s) Performed: Procedure(s) (LRB): COLONOSCOPY WITH PROPOFOL (N/A)  Patient location during evaluation: PACU Anesthesia Type: General Level of consciousness: awake and alert Pain management: pain level controlled Vital Signs Assessment: post-procedure vital signs reviewed and stable Respiratory status: spontaneous breathing, nonlabored ventilation, respiratory function stable and patient connected to nasal cannula oxygen Cardiovascular status: blood pressure returned to baseline and stable Postop Assessment: no signs of nausea or vomiting Anesthetic complications: no    Last Vitals:  Filed Vitals:   03/07/16 1017 03/07/16 1020  BP: 125/59 125/59  Pulse: 71 69  Temp: 36.6 C 36.6 C  Resp: 21 22    Last Pain: There were no vitals filed for this visit.               Molli Barrows

## 2016-03-07 NOTE — Anesthesia Preprocedure Evaluation (Signed)
Anesthesia Evaluation  Patient identified by MRN, date of birth, ID band Patient awake    Reviewed: Allergy & Precautions, H&P , NPO status , Patient's Chart, lab work & pertinent test results, reviewed documented beta blocker date and time   Airway Mallampati: II   Neck ROM: full    Dental  (+) Poor Dentition, Upper Dentures   Pulmonary neg pulmonary ROS, asthma , former smoker,    Pulmonary exam normal        Cardiovascular hypertension, On Medications negative cardio ROS Normal cardiovascular exam     Neuro/Psych negative neurological ROS  negative psych ROS   GI/Hepatic negative GI ROS, Neg liver ROS,   Endo/Other  negative endocrine ROSdiabetes  Renal/GU negative Renal ROS  negative genitourinary   Musculoskeletal   Abdominal   Peds  Hematology negative hematology ROS (+)   Anesthesia Other Findings Past Medical History:   Allergy                                                      Asthma                                                       Diabetes mellitus                                            Osteoarthritis                                               Hyperlipidemia                                             No past surgical history on file.   Reproductive/Obstetrics                             Anesthesia Physical Anesthesia Plan  ASA: II  Anesthesia Plan: General   Post-op Pain Management:    Induction:   Airway Management Planned:   Additional Equipment:   Intra-op Plan:   Post-operative Plan:   Informed Consent: I have reviewed the patients History and Physical, chart, labs and discussed the procedure including the risks, benefits and alternatives for the proposed anesthesia with the patient or authorized representative who has indicated his/her understanding and acceptance.   Dental Advisory Given  Plan Discussed with: CRNA  Anesthesia Plan  Comments:         Anesthesia Quick Evaluation

## 2016-03-07 NOTE — Transfer of Care (Signed)
Immediate Anesthesia Transfer of Care Note  Patient: Bridget Romero  Procedure(s) Performed: Procedure(s): COLONOSCOPY WITH PROPOFOL (N/A)  Patient Location: PACU  Anesthesia Type:General  Level of Consciousness: awake, alert , oriented and patient cooperative  Airway & Oxygen Therapy: Patient Spontanous Breathing and Patient connected to nasal cannula oxygen  Post-op Assessment: Report given to RN and Post -op Vital signs reviewed and stable  Post vital signs: Reviewed and stable  Last Vitals:  Filed Vitals:   03/07/16 1017 03/07/16 1020  BP: 125/59 125/59  Pulse: 71 69  Temp: 36.6 C 36.6 C  Resp: 21 22    Complications: No apparent anesthesia complications

## 2016-03-15 ENCOUNTER — Other Ambulatory Visit: Payer: Self-pay

## 2016-03-15 MED ORDER — METFORMIN HCL ER 500 MG PO TB24
ORAL_TABLET | ORAL | Status: DC
Start: 2016-03-15 — End: 2016-08-02

## 2016-03-15 NOTE — Telephone Encounter (Signed)
Rx sent electronically.  

## 2016-03-17 ENCOUNTER — Ambulatory Visit: Payer: Medicare Other | Admitting: Internal Medicine

## 2016-03-20 ENCOUNTER — Encounter: Payer: Self-pay | Admitting: Internal Medicine

## 2016-03-20 ENCOUNTER — Ambulatory Visit (INDEPENDENT_AMBULATORY_CARE_PROVIDER_SITE_OTHER): Payer: Medicare Other | Admitting: Internal Medicine

## 2016-03-20 VITALS — BP 140/82 | HR 80 | Temp 98.4°F | Wt 186.0 lb

## 2016-03-20 DIAGNOSIS — I10 Essential (primary) hypertension: Secondary | ICD-10-CM

## 2016-03-20 DIAGNOSIS — R945 Abnormal results of liver function studies: Secondary | ICD-10-CM

## 2016-03-20 DIAGNOSIS — R7989 Other specified abnormal findings of blood chemistry: Secondary | ICD-10-CM | POA: Diagnosis not present

## 2016-03-20 LAB — HEPATITIS PANEL, ACUTE
HCV AB: NEGATIVE
HEP A IGM: NONREACTIVE
HEP B C IGM: NONREACTIVE
Hepatitis B Surface Ag: NEGATIVE

## 2016-03-20 LAB — COMPREHENSIVE METABOLIC PANEL
ALT: 66 U/L — ABNORMAL HIGH (ref 0–35)
AST: 45 U/L — ABNORMAL HIGH (ref 0–37)
Albumin: 3.9 g/dL (ref 3.5–5.2)
Alkaline Phosphatase: 53 U/L (ref 39–117)
BUN: 13 mg/dL (ref 6–23)
CALCIUM: 9.8 mg/dL (ref 8.4–10.5)
CHLORIDE: 102 meq/L (ref 96–112)
CO2: 30 meq/L (ref 19–32)
Creatinine, Ser: 0.59 mg/dL (ref 0.40–1.20)
GFR: 130.35 mL/min (ref >60.00)
Glucose, Bld: 168 mg/dL — ABNORMAL HIGH (ref 70–99)
POTASSIUM: 4 meq/L (ref 3.5–5.1)
Sodium: 138 mEq/L (ref 135–145)
Total Bilirubin: 0.5 mg/dL (ref 0.2–1.2)
Total Protein: 6.9 g/dL (ref 6.0–8.3)

## 2016-03-20 NOTE — Assessment & Plan Note (Signed)
BP Readings from Last 3 Encounters:  03/20/16 140/82  03/07/16 125/59  03/01/16 146/92   Same on left No change for now--could take a while to see full ACEI effects If persistent cough (not clear if she has URI now)--would change to losartan

## 2016-03-20 NOTE — Progress Notes (Signed)
Pre visit review using our clinic review tool, if applicable. No additional management support is needed unless otherwise documented below in the visit note. 

## 2016-03-20 NOTE — Assessment & Plan Note (Signed)
Hopefully back to normal now Will recheck --with hepatitis profile

## 2016-03-20 NOTE — Progress Notes (Signed)
Subjective:    Patient ID: Bridget Romero, female    DOB: 18-Apr-1946, 70 y.o.   MRN: [id number]  HPI Here for follow up of HTN  No apparent side effects from the lisinopril Does have some cough--but has URI (head congestion, drainage) No dizziness No chest pain  Muscle aching is finally getting better Off the statin now for a while Appetite is still fine  Current Outpatient Prescriptions on File Prior to Visit  Medication Sig Dispense Refill  . ACCU-CHEK FASTCLIX LANCETS MISC Use to test blood sugar twice daily dx: 250.02 100 each 3  . albuterol (PROVENTIL HFA;VENTOLIN HFA) 108 (90 BASE) MCG/ACT inhaler Inhale 2 puffs into the lungs every 6 (six) hours as needed for wheezing or shortness of breath. 2 Inhaler 3  . Alcohol Swabs (B-D SINGLE USE SWABS REGULAR) PADS Use to test blood sugar once daily dx: 250.00 100 each 3  . Blood Pressure Monitoring (BLOOD PRESSURE CUFF) MISC Measure blood pressure once daily as directed. 1 each 0  . Cholecalciferol (VITAMIN D) 1000 UNITS capsule Take 1,000 Units by mouth daily.      . fluticasone (FLOVENT HFA) 110 MCG/ACT inhaler Inhale 2 puffs into the lungs daily. 12 g 3  . glucose blood (ACCU-CHEK SMARTVIEW) test strip Use as instructed to test blood once daily dx: E11.9 100 each 3  . lisinopril (PRINIVIL,ZESTRIL) 10 MG tablet Take 1 tablet (10 mg total) by mouth daily. 30 tablet 3  . metFORMIN (GLUCOPHAGE-XR) 500 MG 24 hr tablet take 3 tablets by mouth every morning with BREAKFAST 270 tablet 0  . Multiple Vitamin (MULTIVITAMIN) tablet Take 1 tablet by mouth daily.      . temazepam (RESTORIL) 30 MG capsule take 1 capsule by mouth at bedtime if needed for sleep 30 capsule 0   No current facility-administered medications on file prior to visit.    Allergies  Allergen Reactions  . Atorvastatin Other (See Comments)    myalgias  . Pravastatin Other (See Comments)    Muscle pain    Past Medical History  Diagnosis Date  . Allergy   . Asthma    . Diabetes mellitus   . Osteoarthritis   . Hyperlipidemia     Past Surgical History  Procedure Laterality Date  . Colonoscopy with propofol N/A 03/07/2016    Procedure: COLONOSCOPY WITH PROPOFOL;  Surgeon: Lollie Sails, MD;  Location: [facility] ENDOSCOPY;  Service: Endoscopy;  Laterality: N/A;    Family History  Problem Relation Age of Onset  . Heart disease Mother   . Cancer Father     Social History   Social History  . Marital Status: Widowed    Spouse Name: N/A  . Number of Children: 2  . Years of Education: N/A   Occupational History  . Retired as Training and development officer at [employer]  . Smoking status: Former Research scientist (life sciences)  . Smokeless tobacco: Never Used  . Alcohol Use: No  . Drug Use: No  . Sexual Activity: Not on file   Other Topics Concern  . Not on file   Social History Narrative   No living will   Son and daughter should make health care decisions for her   Would accept resuscitation   Not sure about tube feeds   Review of Systems Recent colonoscopy looked fine Still doesn't sleep well at times-- uses the temazepam prn only    Objective:   Physical Exam  Constitutional: She appears well-nourished. No distress.  Neck: Normal range of motion. Neck supple.  Cardiovascular: Normal rate, regular rhythm and normal heart sounds.   Pulmonary/Chest: Effort normal and breath sounds normal. No respiratory distress. She has no wheezes. She has no rales.  Musculoskeletal: She exhibits no edema.  Lymphadenopathy:    She has no cervical adenopathy.  Psychiatric: She has a normal mood and affect. Her behavior is normal.          Assessment & Plan:

## 2016-04-10 ENCOUNTER — Other Ambulatory Visit: Payer: Self-pay

## 2016-04-10 NOTE — Telephone Encounter (Signed)
Approved: 30 x 0 

## 2016-04-10 NOTE — Telephone Encounter (Signed)
Last filled 12-22-15 #30 Last OV 03-20-16 Next OV 06-21-16

## 2016-04-11 MED ORDER — TEMAZEPAM 30 MG PO CAPS: ORAL_CAPSULE | ORAL | Status: DC

## 2016-04-11 NOTE — Telephone Encounter (Signed)
Left refill on voice mail at pharmacy  

## 2016-05-02 ENCOUNTER — Encounter: Payer: Self-pay | Admitting: Family

## 2016-05-02 ENCOUNTER — Ambulatory Visit (INDEPENDENT_AMBULATORY_CARE_PROVIDER_SITE_OTHER): Payer: Medicare Other | Admitting: Family

## 2016-05-02 VITALS — BP 134/82 | HR 95 | Temp 99.3°F | Wt 185.2 lb

## 2016-05-02 DIAGNOSIS — R05 Cough: Secondary | ICD-10-CM

## 2016-05-02 DIAGNOSIS — R059 Cough, unspecified: Secondary | ICD-10-CM

## 2016-05-02 MED ORDER — HYDROCODONE-HOMATROPINE 5-1.5 MG/5ML PO SYRP: 5.0000 mL | ORAL_SOLUTION | Freq: Every evening | ORAL | Status: DC | PRN

## 2016-05-02 NOTE — Progress Notes (Signed)
Subjective:    Patient ID: Bridget Romero, female    DOB: 10/21/1946, 70 y.o.   MRN: [id number]   Bridget Romero is a 70 y.o. female who presents today for an acute visit.    HPI Comments: Cough and chills started 5 days ago; waxing and waning.   Cough This is a new problem. The current episode started in the past 7 days. The problem has been waxing and waning. The problem occurs every few hours. The cough is productive of sputum. Associated symptoms include chills, rhinorrhea and a sore throat. Pertinent negatives include no chest pain, ear pain, fever, shortness of breath or wheezing. The symptoms are aggravated by lying down. Treatments tried: tylenol, benadryl. The treatment provided mild relief. Her past medical history is significant for asthma and environmental allergies. There is no history of COPD or pneumonia.   Past Medical History  Diagnosis Date  . Allergy   . Asthma   . Diabetes mellitus   . Osteoarthritis   . Hyperlipidemia    Allergies: Atorvastatin and Pravastatin Current Outpatient Prescriptions on File Prior to Visit  Medication Sig Dispense Refill  . ACCU-CHEK FASTCLIX LANCETS MISC Use to test blood sugar twice daily dx: 250.02 100 each 3  . albuterol (PROVENTIL HFA;VENTOLIN HFA) 108 (90 BASE) MCG/ACT inhaler Inhale 2 puffs into the lungs every 6 (six) hours as needed for wheezing or shortness of breath. 2 Inhaler 3  . Alcohol Swabs (B-D SINGLE USE SWABS REGULAR) PADS Use to test blood sugar once daily dx: 250.00 100 each 3  . Blood Pressure Monitoring (BLOOD PRESSURE CUFF) MISC Measure blood pressure once daily as directed. 1 each 0  . Cholecalciferol (VITAMIN D) 1000 UNITS capsule Take 1,000 Units by mouth daily.      . fluticasone (FLOVENT HFA) 110 MCG/ACT inhaler Inhale 2 puffs into the lungs daily. 12 g 3  . glucose blood (ACCU-CHEK SMARTVIEW) test strip Use as instructed to test blood once daily dx: E11.9 100 each 3  . lisinopril (PRINIVIL,ZESTRIL) 10 MG  tablet Take 1 tablet (10 mg total) by mouth daily. 30 tablet 3  . metFORMIN (GLUCOPHAGE-XR) 500 MG 24 hr tablet take 3 tablets by mouth every morning with BREAKFAST 270 tablet 0  . Multiple Vitamin (MULTIVITAMIN) tablet Take 1 tablet by mouth daily.      . temazepam (RESTORIL) 30 MG capsule take 1 capsule by mouth at bedtime if needed for sleep 30 capsule 0   No current facility-administered medications on file prior to visit.    Social History  Substance Use Topics  . Smoking status: Former Research scientist (life sciences)  . Smokeless tobacco: Never Used  . Alcohol Use: No    Review of Systems  Constitutional: Positive for chills. Negative for fever.  HENT: Positive for congestion, rhinorrhea and sore throat. Negative for ear pain and sinus pressure.   Respiratory: Positive for cough. Negative for shortness of breath and wheezing.   Cardiovascular: Negative for chest pain and palpitations.  Gastrointestinal: Negative for nausea and vomiting.  Allergic/Immunologic: Positive for environmental allergies.      Objective:    BP 134/82 mmHg  Pulse 95  Temp(Src) 99.3 F (37.4 C) (Oral)  Wt 185 lb 4 oz (84.029 kg)  SpO2 95%   Physical Exam  Constitutional: She appears well-developed and well-nourished.  HENT:  Head: Normocephalic and atraumatic.  Right Ear: Hearing, tympanic membrane, external ear and ear canal normal. No drainage, swelling or tenderness. No foreign bodies. Tympanic membrane is not erythematous  and not bulging. No middle ear effusion. No decreased hearing is noted.  Left Ear: Hearing, tympanic membrane, external ear and ear canal normal. No drainage, swelling or tenderness. No foreign bodies. Tympanic membrane is not erythematous and not bulging.  No middle ear effusion. No decreased hearing is noted.  Nose: Rhinorrhea present. Right sinus exhibits no maxillary sinus tenderness and no frontal sinus tenderness. Left sinus exhibits no maxillary sinus tenderness and no frontal sinus tenderness.   Mouth/Throat: Uvula is midline, oropharynx is clear and moist and mucous membranes are normal. No oropharyngeal exudate, posterior oropharyngeal edema, posterior oropharyngeal erythema or tonsillar abscesses.  Eyes: Conjunctivae are normal.  Cardiovascular: Regular rhythm, normal heart sounds and normal pulses.   Pulmonary/Chest: Effort normal and breath sounds normal. She has no wheezes. She has no rhonchi. She has no rales.  Lymphadenopathy:       Head (right side): No submental, no submandibular, no tonsillar, no preauricular, no posterior auricular and no occipital adenopathy present.       Head (left side): No submental, no submandibular, no tonsillar, no preauricular, no posterior auricular and no occipital adenopathy present.    She has no cervical adenopathy.  Neurological: She is alert.  Skin: Skin is warm and dry.  Psychiatric: She has a normal mood and affect. Her speech is normal and behavior is normal. Thought content normal.  Vitals reviewed.      Assessment & Plan:   1. Cough Working diagnosis of viral URI x 5 days. No acute respiratory distress. Low grade fever. Lung sounds clear. Patient and I agreed upon conservative therapy at this time with symptom management, close observation, and delayed antibiotic treatment.  - HYDROcodone-homatropine (HYCODAN) 5-1.5 MG/5ML syrup; Take 5 mLs by mouth at bedtime as needed for cough.  Dispense: 40 mL; Refill: 0    I am having Ms. Rizzolo start on HYDROcodone-homatropine. I am also having her maintain her Vitamin D, multivitamin, B-D SINGLE USE SWABS REGULAR, ACCU-CHEK FASTCLIX LANCETS, fluticasone, albuterol, glucose blood, lisinopril, Blood Pressure Cuff, metFORMIN, and temazepam.   Meds ordered this encounter  Medications  . HYDROcodone-homatropine (HYCODAN) 5-1.5 MG/5ML syrup    Sig: Take 5 mLs by mouth at bedtime as needed for cough.    Dispense:  40 mL    Refill:  0    Order Specific Question:  Supervising Provider     Answer:  Cassandria Anger [[id number]]     Start medications as prescribed and explained to patient on After Visit Summary ( AVS). Risks, benefits, and alternatives of the medications and treatment plan prescribed today were discussed, and patient expressed understanding.   Education regarding symptom management and diagnosis given to patient.   Follow-up:Plan follow-up as discussed or as needed if any worsening symptoms or change in condition.   Continue to follow with Viviana Simpler, MD for routine health maintenance.   Bridget Romero and I agreed with plan.   Mable Paris, FNP

## 2016-05-02 NOTE — Patient Instructions (Signed)
Increase intake of clear fluids. Congestion is best treated by hydration, when mucus is wetter, it is thinner, less sticky, and easier to expel from the body, either through coughing up drainage, or by blowing your nose.   Get plenty of rest.   Use saline nasal drops and blow your nose frequently. Run a humidifier at night and elevate the head of the bed. Vicks Vapor rub will help with congestion and cough. Steam showers and sinus massage for congestion.   Use Acetaminophen or Ibuprofen as needed for fever or pain. Avoid second hand smoke. Even the smallest exposure will worsen symptoms.   Over the counter medications you can try include Delsym for cough, a decongestant for congestion, and Mucinex or Robitussin as an expectorant. Be sure to just get the plain Mucinex or Robitussin that just has one medication (Guaifenesen). We don't recommend the combination products. Note, be sure to drink two glasses of water with each dose of Mucinex as the medication will not work well without adequate hydration.   You can also try a teaspoon of honey to see if this will help reduce cough. Throat lozenges can sometimes be beneficial as well.    This illness will typically last 7 - 10 days.   Please follow up with our clinic if you develop a fever greater than 101 F, symptoms worsen, or do not resolve in the next week.    

## 2016-05-02 NOTE — Progress Notes (Signed)
Pre visit review using our clinic review tool, if applicable. No additional management support is needed unless otherwise documented below in the visit note. 

## 2016-05-16 ENCOUNTER — Telehealth: Payer: Self-pay

## 2016-05-16 MED ORDER — TEMAZEPAM 30 MG PO CAPS: ORAL_CAPSULE | ORAL | Status: DC

## 2016-05-16 NOTE — Telephone Encounter (Signed)
Pt left v/m requesting refill temazepam to [facility] [address]. Last refilled # 30 on 04/11/16. Last seen 03/20/16.

## 2016-05-16 NOTE — Telephone Encounter (Signed)
Approved: 30 x 0 

## 2016-05-16 NOTE — Telephone Encounter (Signed)
Left refill on voice mail at pharmacy  

## 2016-05-17 NOTE — Telephone Encounter (Signed)
Patient called - medication was denied by insurance.  PA or new prescription needed.

## 2016-05-18 NOTE — Telephone Encounter (Signed)
Spoke to April at the pharmacy. She is refaxing the PA request.

## 2016-05-18 NOTE — Telephone Encounter (Signed)
Coverage for temazepam is available after demonstrating failure to alternatives: Belsomra, Rozerem, or Trazodone. I do not see any of them in her medication list or history.

## 2016-05-18 NOTE — Telephone Encounter (Signed)
Ask her if she is willing to try another I would recommend trazodone--I use that frequently

## 2016-05-18 NOTE — Telephone Encounter (Signed)
Spoke to pt. She is willing to try trazodone.

## 2016-05-19 ENCOUNTER — Other Ambulatory Visit: Payer: Self-pay | Admitting: Internal Medicine

## 2016-05-19 DIAGNOSIS — I10 Essential (primary) hypertension: Secondary | ICD-10-CM

## 2016-05-19 MED ORDER — LISINOPRIL 10 MG PO TABS: 10.0000 mg | ORAL_TABLET | Freq: Every day | ORAL | Status: DC

## 2016-05-19 MED ORDER — TRAZODONE HCL 50 MG PO TABS: 50.0000 mg | ORAL_TABLET | Freq: Every day | ORAL | Status: DC

## 2016-05-19 NOTE — Telephone Encounter (Signed)
Let her know I sent the new prescription She should start with 1 pill nightly for 4-5 nights--then increase to 2 if that is not effective. Have her call with any problems--or if she is not having any success after 2-3 weeks

## 2016-05-19 NOTE — Telephone Encounter (Signed)
Spoke to pt. She will let us know if she has issues

## 2016-05-19 NOTE — Addendum Note (Signed)
Addended by: Viviana Simpler I on: 05/19/2016 09:05 AM   Modules accepted: Orders, Medications

## 2016-05-19 NOTE — Telephone Encounter (Signed)
Received fax refill request for lisinopril 10 mg. Last seen on 03/20/2016. Follow up on 06/21/2016.  Sent refill to [facility].

## 2016-05-30 ENCOUNTER — Other Ambulatory Visit: Payer: Self-pay

## 2016-05-30 MED ORDER — NYSTATIN 100000 UNIT/GM EX POWD: Freq: Two times a day (BID) | CUTANEOUS | Status: DC

## 2016-05-30 NOTE — Telephone Encounter (Signed)
Approved: #60gm x 3 

## 2016-05-30 NOTE — Telephone Encounter (Signed)
Rx sent electronically.  

## 2016-05-30 NOTE — Telephone Encounter (Signed)
Last filled 11-07-13 #60 Not on medication list

## 2016-06-21 ENCOUNTER — Ambulatory Visit (INDEPENDENT_AMBULATORY_CARE_PROVIDER_SITE_OTHER): Payer: Medicare Other | Admitting: Internal Medicine

## 2016-06-21 ENCOUNTER — Encounter: Payer: Self-pay | Admitting: Internal Medicine

## 2016-06-21 VITALS — BP 130/84 | HR 72 | Temp 97.9°F | Wt 186.0 lb

## 2016-06-21 DIAGNOSIS — I10 Essential (primary) hypertension: Secondary | ICD-10-CM

## 2016-06-21 DIAGNOSIS — E785 Hyperlipidemia, unspecified: Secondary | ICD-10-CM | POA: Diagnosis not present

## 2016-06-21 DIAGNOSIS — E119 Type 2 diabetes mellitus without complications: Secondary | ICD-10-CM | POA: Diagnosis not present

## 2016-06-21 DIAGNOSIS — J4520 Mild intermittent asthma, uncomplicated: Secondary | ICD-10-CM

## 2016-06-21 DIAGNOSIS — M791 Myalgia, unspecified site: Secondary | ICD-10-CM

## 2016-06-21 LAB — HEMOGLOBIN A1C: HEMOGLOBIN A1C: 6.7 % — AB (ref 4.6–6.5)

## 2016-06-21 MED ORDER — FLUTICASONE PROPIONATE HFA 110 MCG/ACT IN AERO: 2.0000 | INHALATION_SPRAY | Freq: Every day | RESPIRATORY_TRACT | Status: DC

## 2016-06-21 NOTE — Assessment & Plan Note (Signed)
Seems to still have good control Will check A1c 

## 2016-06-21 NOTE — Assessment & Plan Note (Signed)
Resolved off the statin

## 2016-06-21 NOTE — Assessment & Plan Note (Signed)
Does well with the flovent Rarely needs rescue inhaler

## 2016-06-21 NOTE — Assessment & Plan Note (Signed)
BP Readings from Last 3 Encounters:  06/21/16 130/84  05/02/16 134/82  03/20/16 140/82   Good control No changes

## 2016-06-21 NOTE — Assessment & Plan Note (Signed)
Myalgia and increased LFTs with statin Rx Will hold off on any Rx

## 2016-06-21 NOTE — Progress Notes (Signed)
Subjective:    Patient ID: Bridget Romero, female    DOB: June 01, 1946, 70 y.o.   MRN: [id number]  HPI Here for follow up of diabetes and other chronic health conditions  Has been walking and doing some home exercise Checks sugars once or twice a day Usually 120-130 No hypoglycemic reactions No sores, pain or numbness in feet  No chest pain No palpitations No SOB No dizziness or syncope  Current Outpatient Prescriptions on File Prior to Visit  Medication Sig Dispense Refill  . ACCU-CHEK FASTCLIX LANCETS MISC Use to test blood sugar twice daily dx: 250.02 100 each 3  . albuterol (PROVENTIL HFA;VENTOLIN HFA) 108 (90 BASE) MCG/ACT inhaler Inhale 2 puffs into the lungs every 6 (six) hours as needed for wheezing or shortness of breath. 2 Inhaler 3  . Alcohol Swabs (B-D SINGLE USE SWABS REGULAR) PADS Use to test blood sugar once daily dx: 250.00 100 each 3  . Blood Pressure Monitoring (BLOOD PRESSURE CUFF) MISC Measure blood pressure once daily as directed. 1 each 0  . Cholecalciferol (VITAMIN D) 1000 UNITS capsule Take 1,000 Units by mouth daily.      . fluticasone (FLOVENT HFA) 110 MCG/ACT inhaler Inhale 2 puffs into the lungs daily. 12 g 3  . glucose blood (ACCU-CHEK SMARTVIEW) test strip Use as instructed to test blood once daily dx: E11.9 100 each 3  . lisinopril (PRINIVIL,ZESTRIL) 10 MG tablet Take 1 tablet (10 mg total) by mouth daily. 30 tablet 0  . metFORMIN (GLUCOPHAGE-XR) 500 MG 24 hr tablet take 3 tablets by mouth every morning with BREAKFAST 270 tablet 0  . Multiple Vitamin (MULTIVITAMIN) tablet Take 1 tablet by mouth daily.      Marland Kitchen nystatin (MYCOSTATIN) powder Apply topically 2 (two) times daily. 60 g 3  . traZODone (DESYREL) 50 MG tablet Take 1-2 tablets (50-100 mg total) by mouth at bedtime. 60 tablet 11   No current facility-administered medications on file prior to visit.    Allergies  Allergen Reactions  . Atorvastatin Other (See Comments)    myalgias  .  Pravastatin Other (See Comments)    Muscle pain    Past Medical History  Diagnosis Date  . Allergy   . Asthma   . Diabetes mellitus   . Osteoarthritis   . Hyperlipidemia     Past Surgical History  Procedure Laterality Date  . Colonoscopy with propofol N/A 03/07/2016    Procedure: COLONOSCOPY WITH PROPOFOL;  Surgeon: Lollie Sails, MD;  Location: [facility] ENDOSCOPY;  Service: Endoscopy;  Laterality: N/A;    Family History  Problem Relation Age of Onset  . Heart disease Mother   . Cancer Father     Social History   Social History  . Marital Status: Widowed    Spouse Name: N/A  . Number of Children: 2  . Years of Education: N/A   Occupational History  . Retired as Training and development officer at [employer]  . Smoking status: Former Research scientist (life sciences)  . Smokeless tobacco: Never Used  . Alcohol Use: No  . Drug Use: No  . Sexual Activity: Not on file   Other Topics Concern  . Not on file   Social History Narrative   No living will   Son and daughter should make health care decisions for her   Would accept resuscitation   Not sure about tube feeds   Review of Systems No myalgia as long as no  statin LFTs were better when off that too Appetite is good Weight stable Sleeps well    Objective:   Physical Exam  Constitutional: Bridget Romero appears well-developed and well-nourished. No distress.  Neck: Normal range of motion. Neck supple. No thyromegaly present.  Cardiovascular: Normal rate, regular rhythm, normal heart sounds and intact distal pulses.  Exam reveals no gallop.   No murmur heard. Pulmonary/Chest: Effort normal and breath sounds normal. No respiratory distress. Bridget Romero has no wheezes. Bridget Romero has no rales.  Musculoskeletal: Bridget Romero exhibits no edema or tenderness.  Lymphadenopathy:    Bridget Romero has no cervical adenopathy.  Skin:  No foot lesions  Psychiatric: Bridget Romero has a normal mood and affect. Her behavior is normal.          Assessment & Plan:

## 2016-06-21 NOTE — Progress Notes (Signed)
Pre visit review using our clinic review tool, if applicable. No additional management support is needed unless otherwise documented below in the visit note. 

## 2016-06-23 ENCOUNTER — Other Ambulatory Visit: Payer: Self-pay

## 2016-06-23 DIAGNOSIS — I10 Essential (primary) hypertension: Secondary | ICD-10-CM

## 2016-06-23 MED ORDER — LISINOPRIL 10 MG PO TABS: 10.0000 mg | ORAL_TABLET | Freq: Every day | ORAL | Status: DC

## 2016-06-23 NOTE — Telephone Encounter (Signed)
Rx sent electronically.  

## 2016-06-29 ENCOUNTER — Other Ambulatory Visit: Payer: Self-pay | Admitting: Internal Medicine

## 2016-08-02 ENCOUNTER — Other Ambulatory Visit: Payer: Self-pay | Admitting: Internal Medicine

## 2016-09-11 ENCOUNTER — Ambulatory Visit (INDEPENDENT_AMBULATORY_CARE_PROVIDER_SITE_OTHER): Payer: Medicare Other | Admitting: Internal Medicine

## 2016-09-11 ENCOUNTER — Encounter: Payer: Self-pay | Admitting: Internal Medicine

## 2016-09-11 ENCOUNTER — Telehealth: Payer: Self-pay

## 2016-09-11 VITALS — BP 136/80 | HR 67 | Temp 98.6°F | Wt 187.0 lb

## 2016-09-11 DIAGNOSIS — R5383 Other fatigue: Secondary | ICD-10-CM | POA: Diagnosis not present

## 2016-09-11 DIAGNOSIS — R002 Palpitations: Secondary | ICD-10-CM

## 2016-09-11 NOTE — Telephone Encounter (Signed)
Pt feeling tired and fast heart beat on and off. No CP,SOB or dizziness. H/A on and off. Pt thinks related to Lisinopril 10 mg and wants to know if could change to different med. Pt does not want to schedule an appt. Last seen 06/21/16. While talking on phone pt asked if could be seen today; the only available appt left for today is 09/11/16 at 3:45 with Avie Echevaria NP. Pt requested that appt.

## 2016-09-11 NOTE — Patient Instructions (Signed)

## 2016-09-11 NOTE — Progress Notes (Signed)
Subjective:    Patient ID: Bridget Romero, female    DOB: 02/26/1946, 70 y.o.   MRN: [id number]  HPI   Pt presents to the clinic today with c/o intermittent heart palpitations and fatigue. She reports this started 1 month ago. The palpitations often occur at rest. She denies dizziness, blurred vision, chest pain or shortness of breath. She is on Lisinopril for HTN and feels like this medication may be contributing to her symptoms. She reports she does not sleep well, usually about 4-6 hours a night. Her PCP has her on Trazadone, but she does not think it is helping. She denies any increases in stress. She denies recent changes in diet, activity level or medication .Seh has not tried anything OTC for these symptoms.    Review of Systems    Past Medical History:  Diagnosis Date  . Allergy   . Asthma   . Diabetes mellitus   . Hyperlipidemia   . Osteoarthritis     Current Outpatient Prescriptions  Medication Sig Dispense Refill  . ACCU-CHEK FASTCLIX LANCETS MISC Use to test blood sugar twice daily dx: 250.02 100 each 3  . ACCU-CHEK SMARTVIEW test strip Use as directed to test  blood once daily 100 each 3  . albuterol (PROVENTIL HFA;VENTOLIN HFA) 108 (90 BASE) MCG/ACT inhaler Inhale 2 puffs into the lungs every 6 (six) hours as needed for wheezing or shortness of breath. 2 Inhaler 3  . Alcohol Swabs (B-D SINGLE USE SWABS REGULAR) PADS Use to test blood sugar once daily dx: 250.00 100 each 3  . Blood Pressure Monitoring (BLOOD PRESSURE CUFF) MISC Measure blood pressure once daily as directed. 1 each 0  . Cholecalciferol (VITAMIN D) 1000 UNITS capsule Take 1,000 Units by mouth daily.      . fluticasone (FLOVENT HFA) 110 MCG/ACT inhaler Inhale 2 puffs into the lungs daily. 12 g 3  . lisinopril (PRINIVIL,ZESTRIL) 10 MG tablet Take 1 tablet (10 mg total) by mouth daily. 30 tablet 11  . metFORMIN (GLUCOPHAGE-XR) 500 MG 24 hr tablet take 3 tablets by mouth every morning WITH BREAKFAST 270  tablet 3  . Multiple Vitamin (MULTIVITAMIN) tablet Take 1 tablet by mouth daily.      Marland Kitchen nystatin (MYCOSTATIN) powder Apply topically 2 (two) times daily. 60 g 3  . traZODone (DESYREL) 50 MG tablet Take 1-2 tablets (50-100 mg total) by mouth at bedtime. 60 tablet 11   No current facility-administered medications for this visit.     Allergies  Allergen Reactions  . Atorvastatin Other (See Comments)    myalgias  . Pravastatin Other (See Comments)    Muscle pain    Family History  Problem Relation Age of Onset  . Heart disease Mother   . Cancer Father     Social History   Social History  . Marital status: Widowed    Spouse name: N/A  . Number of children: 2  . Years of education: N/A   Occupational History  . Retired as Training and development officer at [employer]  . Smoking status: Former Research scientist (life sciences)  . Smokeless tobacco: Never Used  . Alcohol use No  . Drug use: No  . Sexual activity: Not on file   Other Topics Concern  . Not on file   Social History Narrative   No living will   Son and daughter should make health care decisions for her   Would accept resuscitation  Not sure about tube feeds      Past Medical History:  Diagnosis Date  . Allergy   . Asthma   . Diabetes mellitus   . Hyperlipidemia   . Osteoarthritis     Current Outpatient Prescriptions  Medication Sig Dispense Refill  . ACCU-CHEK FASTCLIX LANCETS MISC Use to test blood sugar twice daily dx: 250.02 100 each 3  . ACCU-CHEK SMARTVIEW test strip Use as directed to test  blood once daily 100 each 3  . albuterol (PROVENTIL HFA;VENTOLIN HFA) 108 (90 BASE) MCG/ACT inhaler Inhale 2 puffs into the lungs every 6 (six) hours as needed for wheezing or shortness of breath. 2 Inhaler 3  . Alcohol Swabs (B-D SINGLE USE SWABS REGULAR) PADS Use to test blood sugar once daily dx: 250.00 100 each 3  . Blood Pressure Monitoring (BLOOD PRESSURE CUFF) MISC Measure blood pressure once  daily as directed. 1 each 0  . Cholecalciferol (VITAMIN D) 1000 UNITS capsule Take 1,000 Units by mouth daily.      . fluticasone (FLOVENT HFA) 110 MCG/ACT inhaler Inhale 2 puffs into the lungs daily. 12 g 3  . lisinopril (PRINIVIL,ZESTRIL) 10 MG tablet Take 1 tablet (10 mg total) by mouth daily. 30 tablet 11  . metFORMIN (GLUCOPHAGE-XR) 500 MG 24 hr tablet take 3 tablets by mouth every morning WITH BREAKFAST 270 tablet 3  . Multiple Vitamin (MULTIVITAMIN) tablet Take 1 tablet by mouth daily.      Marland Kitchen nystatin (MYCOSTATIN) powder Apply topically 2 (two) times daily. 60 g 3  . traZODone (DESYREL) 50 MG tablet Take 1-2 tablets (50-100 mg total) by mouth at bedtime. 60 tablet 11   No current facility-administered medications for this visit.    Constitutional: Pt reports fatigue. Denies fever, malaise, headache or abrupt weight changes.  Respiratory: Denies difficulty breathing, shortness of breath, cough or sputum production.   Cardiovascular: Pt reports palpitations. Denies chest pain, chest tightness, or swelling in the hands or feet.  Neurological: Denies dizziness, difficulty with memory, difficulty with speech or problems with balance and coordination.  Psych: Denies anxiety, depression, SI/HI.  No other specific complaints in a complete review of systems (except as listed in HPI above).      Objective:   Physical Exam  BP 136/80   Pulse 67   Temp 98.6 F (37 C) (Oral)   Wt 187 lb (84.8 kg)   SpO2 97%   BMI 30.18 kg/m   Wt Readings from Last 3 Encounters:  09/11/16 187 lb (84.8 kg)  06/21/16 186 lb (84.4 kg)  05/02/16 185 lb 4 oz (84 kg)    General: Appears her stated age, well developed, well nourished in NAD. Cardiovascular: Normal rate and rhythm. S1,S2 noted.  No murmur, rubs or gallops noted. No JVD or BLE edema.  Pulmonary/Chest:  Normal effort and positive vesicular breath sounds. No respiratory distress. No rales or ronchi noted.  Neurological: Alert and oriented.    Psychiatric: Mood and affect normal. Behavior is normal. Judgment and thought content normal.     BMET    Component Value Date/Time   NA 138 03/20/2016 1135   K 4.0 03/20/2016 1135   CL 102 03/20/2016 1135   CO2 30 03/20/2016 1135   GLUCOSE 168 (H) 03/20/2016 1135   BUN 13 03/20/2016 1135   CREATININE 0.59 03/20/2016 1135   CALCIUM 9.8 03/20/2016 1135   GFRNONAA 99.01 09/13/2010 1036   GFRAA 94 02/23/2009 0824    Lipid Panel     Component  Value Date/Time   CHOL 166 12/22/2015 1241   TRIG 76.0 12/22/2015 1241   HDL 53.10 12/22/2015 1241   CHOLHDL 3 12/22/2015 1241   VLDL 15.2 12/22/2015 1241   LDLCALC 98 12/22/2015 1241    CBC    Component Value Date/Time   WBC 6.0 03/07/2016 0855   RBC 4.31 03/07/2016 0855   HGB 11.8 (L) 03/07/2016 0855   HCT 35.9 03/07/2016 0855   PLT 313 03/07/2016 0855   MCV 83.3 03/07/2016 0855   MCH 27.5 03/07/2016 0855   MCHC 33.0 03/07/2016 0855   RDW 13.6 03/07/2016 0855   LYMPHSABS 2.2 12/22/2015 1241   MONOABS 0.3 12/22/2015 1241   EOSABS 0.5 12/22/2015 1241   BASOSABS 0.0 12/22/2015 1241    Hgb A1C Lab Results  Component Value Date   HGBA1C 6.7 (H) 06/21/2016          Assessment & Plan:   Palpitations:  ECG today, some artifact but essentially normal Will check TSH, Magnesium, CMET today f palpitations worsen, become more frequent, or have associated symptoms such as chest pain, SOB, or dizziness, will need to contact the office for further instructions or go to the nearest urgent care of hospital.  Fatigue  Will check TSH, Folate, Vit D, B12 levels  Will follow up after labs are back, follow up with PCP as previously scheduled Webb Silversmith, NP

## 2016-09-11 NOTE — Telephone Encounter (Signed)
Will discuss at upcoming appt.

## 2016-09-12 ENCOUNTER — Encounter: Payer: Self-pay | Admitting: Internal Medicine

## 2016-09-12 LAB — COMPREHENSIVE METABOLIC PANEL
ALK PHOS: 53 U/L (ref 39–117)
ALT: 95 U/L — AB (ref 0–35)
AST: 65 U/L — AB (ref 0–37)
Albumin: 3.7 g/dL (ref 3.5–5.2)
BILIRUBIN TOTAL: 0.3 mg/dL (ref 0.2–1.2)
BUN: 18 mg/dL (ref 6–23)
CO2: 31 meq/L (ref 19–32)
CREATININE: 0.58 mg/dL (ref 0.40–1.20)
Calcium: 9.2 mg/dL (ref 8.4–10.5)
Chloride: 105 mEq/L (ref 96–112)
GFR: 132.76 mL/min (ref >60.00)
GLUCOSE: 81 mg/dL (ref 70–99)
Potassium: 4.3 mEq/L (ref 3.5–5.1)
Sodium: 140 mEq/L (ref 135–145)
TOTAL PROTEIN: 6.8 g/dL (ref 6.0–8.3)

## 2016-09-12 LAB — TSH: TSH: 2.52 u[IU]/mL (ref 0.35–4.50)

## 2016-09-12 LAB — VITAMIN D 25 HYDROXY (VIT D DEFICIENCY, FRACTURES): VITD: 39.98 ng/mL (ref 30.00–100.00)

## 2016-09-12 LAB — VITAMIN B12: VITAMIN B 12: 149 pg/mL — AB (ref 211–911)

## 2016-09-12 LAB — FOLATE: Folate: >23.7 ng/mL (ref >5.9)

## 2016-09-12 LAB — MAGNESIUM: Magnesium: 1.5 mg/dL (ref 1.5–2.5)

## 2016-10-03 ENCOUNTER — Other Ambulatory Visit: Payer: Self-pay | Admitting: Internal Medicine

## 2016-10-03 DIAGNOSIS — Z1231 Encounter for screening mammogram for malignant neoplasm of breast: Secondary | ICD-10-CM

## 2016-10-20 ENCOUNTER — Ambulatory Visit (INDEPENDENT_AMBULATORY_CARE_PROVIDER_SITE_OTHER): Payer: Medicare Other | Admitting: Family Medicine

## 2016-10-20 ENCOUNTER — Encounter: Payer: Self-pay | Admitting: Family Medicine

## 2016-10-20 ENCOUNTER — Ambulatory Visit: Payer: Medicare Other | Admitting: Primary Care

## 2016-10-20 VITALS — BP 130/80 | HR 74 | Ht 66.0 in | Wt 185.0 lb

## 2016-10-20 DIAGNOSIS — G4700 Insomnia, unspecified: Secondary | ICD-10-CM

## 2016-10-20 DIAGNOSIS — M545 Low back pain, unspecified: Secondary | ICD-10-CM

## 2016-10-20 NOTE — Patient Instructions (Addendum)
Get vitamin B 12 sublingual at the pharmacy and take every day Try melatonin at bedtime for at least 2 weeks Insomnia Insomnia is a sleep disorder that makes it difficult to fall asleep or to stay asleep. Insomnia can cause tiredness (fatigue), low energy, difficulty concentrating, mood swings, and poor performance at work or school.  There are three different ways to classify insomnia:  Difficulty falling asleep.  Difficulty staying asleep.  Waking up too early in the morning. Any type of insomnia can be long-term (chronic) or short-term (acute). Both are common. Short-term insomnia usually lasts for three months or less. Chronic insomnia occurs at least three times a week for longer than three months. CAUSES  Insomnia may be caused by another condition, situation, or substance, such as:  Anxiety.  Certain medicines.  Gastroesophageal reflux disease (GERD) or other gastrointestinal conditions.  Asthma or other breathing conditions.  Restless legs syndrome, sleep apnea, or other sleep disorders.  Chronic pain.  Menopause. This may include hot flashes.  Stroke.  Abuse of alcohol, tobacco, or illegal drugs.  Depression.  Caffeine.   Neurological disorders, such as Alzheimer disease.  An overactive thyroid (hyperthyroidism). The cause of insomnia may not be known. RISK FACTORS Risk factors for insomnia include:  Gender. Women are more commonly affected than men.  Age. Insomnia is more common as you get older.  Stress. This may involve your professional or personal life.  Income. Insomnia is more common in people with lower income.  Lack of exercise.   Irregular work schedule or night shifts.  Traveling between different time zones. SIGNS AND SYMPTOMS If you have insomnia, trouble falling asleep or trouble staying asleep is the main symptom. This may lead to other symptoms, such as:  Feeling fatigued.  Feeling nervous about going to sleep.  Not feeling  rested in the morning.  Having trouble concentrating.  Feeling irritable, anxious, or depressed. TREATMENT  Treatment for insomnia depends on the cause. If your insomnia is caused by an underlying condition, treatment will focus on addressing the condition. Treatment may also include:   Medicines to help you sleep.  Counseling or therapy.  Lifestyle adjustments. HOME CARE INSTRUCTIONS   Take medicines only as directed by your health care provider.  Keep regular sleeping and waking hours. Avoid naps.  Keep a sleep diary to help you and your health care provider figure out what could be causing your insomnia. Include:   When you sleep.  When you wake up during the night.  How well you sleep.   How rested you feel the next day.  Any side effects of medicines you are taking.  What you eat and drink.   Make your bedroom a comfortable place where it is easy to fall asleep:  Put up shades or special blackout curtains to block light from outside.  Use a white noise machine to block noise.  Keep the temperature cool.   Exercise regularly as directed by your health care provider. Avoid exercising right before bedtime.  Use relaxation techniques to manage stress. Ask your health care provider to suggest some techniques that may work well for you. These may include:  Breathing exercises.  Routines to release muscle tension.  Visualizing peaceful scenes.  Cut back on alcohol, caffeinated beverages, and cigarettes, especially close to bedtime. These can disrupt your sleep.  Do not overeat or eat spicy foods right before bedtime. This can lead to digestive discomfort that can make it hard for you to sleep.  Limit screen  use before bedtime. This includes:  Watching TV.  Using your smartphone, tablet, and computer.  Stick to a routine. This can help you fall asleep faster. Try to do a quiet activity, brush your teeth, and go to bed at the same time each night.  Get  out of bed if you are still awake after 15 minutes of trying to sleep. Keep the lights down, but try reading or doing a quiet activity. When you feel sleepy, go back to bed.  Make sure that you drive carefully. Avoid driving if you feel very sleepy.  Keep all follow-up appointments as directed by your health care provider. This is important. SEEK MEDICAL CARE IF:   You are tired throughout the day or have trouble in your daily routine due to sleepiness.  You continue to have sleep problems or your sleep problems get worse. SEEK IMMEDIATE MEDICAL CARE IF:   You have serious thoughts about hurting yourself or someone else.   This information is not intended to replace advice given to you by your health care provider. Make sure you discuss any questions you have with your health care provider.   Document Released: 12/08/2000 Document Revised: 09/01/2015 Document Reviewed: 09/11/2014 Elsevier Interactive Patient Education 2016 Elsevier Inc[employer]  Back Exercises The following exercises strengthen the muscles that help to support the back. They also help to keep the lower back flexible. Doing these exercises can help to prevent back pain or lessen existing pain. If you have back pain or discomfort, try doing these exercises 2-3 times each day or as told by your health care provider. When the pain goes away, do them once each day, but increase the number of times that you repeat the steps for each exercise (do more repetitions). If you do not have back pain or discomfort, do these exercises once each day or as told by your health care provider. EXERCISES Single Knee to Chest Repeat these steps 3-5 times for each leg: 1. Lie on your back on a firm bed or the floor with your legs extended. 2. Bring one knee to your chest. Your other leg should stay extended and in contact with the floor. 3. Hold your knee in place by grabbing your knee or thigh. 4. Pull on your knee until you feel a gentle stretch in  your lower back. 5. Hold the stretch for 10-30 seconds. 6. Slowly release and straighten your leg. Pelvic Tilt Repeat these steps 5-10 times: 1. Lie on your back on a firm bed or the floor with your legs extended. 2. Bend your knees so they are pointing toward the ceiling and your feet are flat on the floor. 3. Tighten your lower abdominal muscles to press your lower back against the floor. This motion will tilt your pelvis so your tailbone points up toward the ceiling instead of pointing to your feet or the floor. 4. With gentle tension and even breathing, hold this position for 5-10 seconds. Cat-Cow Repeat these steps until your lower back becomes more flexible: 1. Get into a hands-and-knees position on a firm surface. Keep your hands under your shoulders, and keep your knees under your hips. You may place padding under your knees for comfort. 2. Let your head hang down, and point your tailbone toward the floor so your lower back becomes rounded like the back of a cat. 3. Hold this position for 5 seconds. 4. Slowly lift your head and point your tailbone up toward the ceiling so your back forms a sagging  arch like the back of a cow. 5. Hold this position for 5 seconds. Press-Ups Repeat these steps 5-10 times: 1. Lie on your abdomen (face-down) on the floor. 2. Place your palms near your head, about shoulder-width apart. 3. While you keep your back as relaxed as possible and keep your hips on the floor, slowly straighten your arms to raise the top half of your body and lift your shoulders. Do not use your back muscles to raise your upper torso. You may adjust the placement of your hands to make yourself more comfortable. 4. Hold this position for 5 seconds while you keep your back relaxed. 5. Slowly return to lying flat on the floor. Bridges Repeat these steps 10 times: 1. Lie on your back on a firm surface. 2. Bend your knees so they are pointing toward the ceiling and your feet are flat  on the floor. 3. Tighten your buttocks muscles and lift your buttocks off of the floor until your waist is at almost the same height as your knees. You should feel the muscles working in your buttocks and the back of your thighs. If you do not feel these muscles, slide your feet 1-2 inches farther away from your buttocks. 4. Hold this position for 3-5 seconds. 5. Slowly lower your hips to the starting position, and allow your buttocks muscles to relax completely. If this exercise is too easy, try doing it with your arms crossed over your chest. Abdominal Crunches Repeat these steps 5-10 times: 1. Lie on your back on a firm bed or the floor with your legs extended. 2. Bend your knees so they are pointing toward the ceiling and your feet are flat on the floor. 3. Cross your arms over your chest. 4. Tip your chin slightly toward your chest without bending your neck. 5. Tighten your abdominal muscles and slowly raise your trunk (torso) high enough to lift your shoulder blades a tiny bit off of the floor. Avoid raising your torso higher than that, because it can put too much stress on your low back and it does not help to strengthen your abdominal muscles. 6. Slowly return to your starting position. Back Lifts Repeat these steps 5-10 times: 1. Lie on your abdomen (face-down) with your arms at your sides, and rest your forehead on the floor. 2. Tighten the muscles in your legs and your buttocks. 3. Slowly lift your chest off of the floor while you keep your hips pressed to the floor. Keep the back of your head in line with the curve in your back. Your eyes should be looking at the floor. 4. Hold this position for 3-5 seconds. 5. Slowly return to your starting position. SEEK MEDICAL CARE IF:  Your back pain or discomfort gets much worse when you do an exercise.  Your back pain or discomfort does not lessen within 2 hours after you exercise. If you have any of these problems, stop doing these  exercises right away. Do not do them again unless your health care provider says that you can. SEEK IMMEDIATE MEDICAL CARE IF:  You develop sudden, severe back pain. If this happens, stop doing the exercises right away. Do not do them again unless your health care provider says that you can.   This information is not intended to replace advice given to you by your health care provider. Make sure you discuss any questions you have with your health care provider.   Document Released: 01/18/2005 Document Revised: 09/01/2015 Document Reviewed: 02/04/2015 Elsevier  Interactive Patient Education 2016 Elsevier Inc.  

## 2016-10-20 NOTE — Progress Notes (Signed)
Subjective:    Patient ID: Bridget Romero, female    DOB: [birth year], 70 y.o.   MRN: [id number]  HPI This is a pleasant 70 yo female who presents today with low back pack for about a week. Pain across back, no radiation. Pain when getting up from sitting Pain intermittent. No recent injury or falls. No numbness or tingling down legs. No bowel/bladder incontinence. No dysuria, no frequency. No headache or neck pain. Does not interfere with sleep. Walks occasionally for exercise. No stretching. Has taken an occasional ibuprofen with improvement.   Blood sugars run low 100's fasting.   She is also very tired and not sleeping well. Has trouble going to sleep and staying asleep. No caffeine. Rarely naps during the day. Watches TV before bed no computer or telephone use. Has had trouble sleeping since she retired in 2012. Goes to bed around 10:30 or 11 and sleeps until 7. Urinates x 1 some nights. Denies any worry or anxiety. Has used temazepam in the past with good results but was told she can not be prescribed this any more. Has tried trazadone but did not have improvement in symptoms.   Past Medical History:  Diagnosis Date  . Allergy   . Asthma   . Diabetes mellitus   . Hyperlipidemia   . Osteoarthritis    Past Surgical History:  Procedure Laterality Date  . COLONOSCOPY WITH PROPOFOL N/A 03/07/2016   Procedure: COLONOSCOPY WITH PROPOFOL;  Surgeon: Lollie Sails, MD;  Location: [facility] ENDOSCOPY;  Service: Endoscopy;  Laterality: N/A;   Family History  Problem Relation Age of Onset  . Heart disease Mother   . Cancer Father    Social History  Substance Use Topics  . Smoking status: Former Research scientist (life sciences)  . Smokeless tobacco: Never Used  . Alcohol use No    Review of Systems Per HPI    Objective:   Physical Exam  Constitutional: She is oriented to person, place, and time. She appears well-developed and well-nourished. No distress.  HENT:  Head: Normocephalic and atraumatic.  Right  Ear: External ear normal.  Left Ear: External ear normal.  Mouth/Throat: Oropharynx is clear and moist.  Eyes: Conjunctivae and EOM are normal. Pupils are equal, round, and reactive to light. Right eye exhibits no discharge. Left eye exhibits no discharge. No scleral icterus.  Neck: Normal range of motion. Neck supple.  Cardiovascular: Normal rate, regular rhythm and normal heart sounds.   Pulmonary/Chest: Effort normal and breath sounds normal.  Abdominal: Soft. Bowel sounds are normal. She exhibits no distension. There is no tenderness. There is no rebound and no guarding.  Musculoskeletal:       Cervical back: Normal.       Thoracic back: Normal.       Lumbar back: She exhibits tenderness (mildly tender over paraspinal muscles). She exhibits normal range of motion, no bony tenderness and no swelling.  Negative straight leg raise. Full ROM bilateral hips. Hip flexors and hamstrings tight.   Neurological: She is alert and oriented to person, place, and time. She has normal reflexes.  Skin: Skin is warm and dry. She is not diaphoretic.  Psychiatric: She has a normal mood and affect. Her behavior is normal. Judgment and thought content normal.  Vitals reviewed.     BP 130/80   Pulse 74   Ht 5\' 6"  (1.676 m)   Wt 185 lb (83.9 kg)   SpO2 98%   BMI 29.86 kg/m  Wt Readings from Last 3 Encounters:  10/20/16 185 lb (83.9 kg)  09/11/16 187 lb (84.8 kg)  06/21/16 186 lb (84.4 kg)       Assessment & Plan:  1. Acute midline low back pain without sciatica - no worrisome findings through history or exam - Provided written and verbal information regarding diagnosis and treatment. - Written stretching exercises given and encouraged her to walk to daily - RTC precautions reviewed  2. Insomnia, unspecified type - Discussed risks of temazepam and recommended a trial of melatonin along with daily exercise, stretching and meditation - Provided written and verbal information regarding diagnosis  and treatment.  - Follow up with Dr. Silvio Pate in 3 months as scheduled  Clarene Reamer, FNP-BC  [facility], [place]  10/21/2016 8:20 AM

## 2016-10-30 ENCOUNTER — Other Ambulatory Visit: Payer: Self-pay

## 2016-10-30 ENCOUNTER — Ambulatory Visit
Admission: RE | Admit: 2016-10-30 | Discharge: 2016-10-30 | Disposition: A | Discharge status: Home / Self Care | Payer: Medicare Other | Source: Ambulatory Visit | Attending: Internal Medicine | Admitting: Internal Medicine

## 2016-10-30 DIAGNOSIS — Z1231 Encounter for screening mammogram for malignant neoplasm of breast: Secondary | ICD-10-CM | POA: Diagnosis present

## 2016-10-30 MED ORDER — DICLOFENAC SODIUM 50 MG PO TBEC: 50.0000 mg | DELAYED_RELEASE_TABLET | Freq: Two times a day (BID) | ORAL | 0 refills | Status: DC | PRN

## 2016-10-30 NOTE — Telephone Encounter (Signed)
Rx sent electronically.  

## 2016-11-21 ENCOUNTER — Telehealth: Payer: Self-pay | Admitting: *Deleted

## 2016-11-21 DIAGNOSIS — D251 Intramural leiomyoma of uterus: Secondary | ICD-10-CM

## 2016-11-21 NOTE — Telephone Encounter (Signed)
Ultrasound ordered Transvaginal provided no info the last time

## 2016-11-21 NOTE — Telephone Encounter (Signed)
Patient left a voicemail requesting that Dr. Silvio Pate order a repeat U/S of her fibroid tumor.

## 2016-11-27 ENCOUNTER — Encounter: Payer: Self-pay | Admitting: Internal Medicine

## 2016-11-27 ENCOUNTER — Ambulatory Visit (INDEPENDENT_AMBULATORY_CARE_PROVIDER_SITE_OTHER): Payer: Medicare Other | Admitting: Internal Medicine

## 2016-11-27 VITALS — BP 130/84 | HR 85 | Temp 98.6°F | Wt 184.8 lb

## 2016-11-27 DIAGNOSIS — M545 Low back pain, unspecified: Secondary | ICD-10-CM

## 2016-11-27 NOTE — Progress Notes (Signed)
Subjective:    Patient ID: Bridget Romero, female    DOB: 01/01/1946, 70 y.o.   MRN: [id number]  HPI  Pt presents to the clinic today with c/o low back pain. This started 2-3 weeks ago. The pain is in the middle of her back. She describes the pain as achy and throbbing. The pain does not radiate. She denies numbness or tingling in her legs or loss of bowel and bladder. She has had some associated muscle tightness and stiffness. Sitting and laying down makes the pain worse. Standing and walking makes the pain better. She denies any injury that she is aware of. She has tried Tylenol and Thermacare patch with minimal relief. She saw Tor Netters 10/20/16 for the same. She was advised to try stretching in addition to Ibuprofen. She reports her back pain did improve for a short while, but has started back up again. She has a RX for Diclofenac for arthritis in her back, but she reports she does not take it.  Review of Systems      Past Medical History:  Diagnosis Date  . Allergy   . Asthma   . Diabetes mellitus   . Hyperlipidemia   . Osteoarthritis     Current Outpatient Prescriptions  Medication Sig Dispense Refill  . ACCU-CHEK FASTCLIX LANCETS MISC Use to test blood sugar twice daily dx: 250.02 100 each 3  . ACCU-CHEK SMARTVIEW test strip Use as directed to test  blood once daily 100 each 3  . albuterol (PROVENTIL HFA;VENTOLIN HFA) 108 (90 BASE) MCG/ACT inhaler Inhale 2 puffs into the lungs every 6 (six) hours as needed for wheezing or shortness of breath. 2 Inhaler 3  . Alcohol Swabs (B-D SINGLE USE SWABS REGULAR) PADS Use to test blood sugar once daily dx: 250.00 100 each 3  . Blood Pressure Monitoring (BLOOD PRESSURE CUFF) MISC Measure blood pressure once daily as directed. 1 each 0  . Cholecalciferol (VITAMIN D) 1000 UNITS capsule Take 1,000 Units by mouth daily.      . diclofenac (VOLTAREN) 50 MG EC tablet Take 1 tablet (50 mg total) by mouth 2 (two) times daily as needed. For  arthritis pain 60 tablet 0  . fluticasone (FLOVENT HFA) 110 MCG/ACT inhaler Inhale 2 puffs into the lungs daily. 12 g 3  . lisinopril (PRINIVIL,ZESTRIL) 10 MG tablet Take 1 tablet (10 mg total) by mouth daily. 30 tablet 11  . metFORMIN (GLUCOPHAGE-XR) 500 MG 24 hr tablet take 3 tablets by mouth every morning WITH BREAKFAST 270 tablet 3  . Multiple Vitamin (MULTIVITAMIN) tablet Take 1 tablet by mouth daily.      Marland Kitchen nystatin (MYCOSTATIN) powder Apply topically 2 (two) times daily. 60 g 3   No current facility-administered medications for this visit.     Allergies  Allergen Reactions  . Atorvastatin Other (See Comments)    myalgias  . Pravastatin Other (See Comments)    Muscle pain    Family History  Problem Relation Age of Onset  . Heart disease Mother   . Cancer Father     Social History   Social History  . Marital status: Widowed    Spouse name: N/A  . Number of children: 2  . Years of education: N/A   Occupational History  . Retired as Training and development officer at [employer]  . Smoking status: Former Research scientist (life sciences)  . Smokeless tobacco: Never Used  . Alcohol use No  .  Drug use: No  . Sexual activity: Not on file   Other Topics Concern  . Not on file   Social History Narrative   No living will   Son and daughter should make health care decisions for her   Would accept resuscitation   Not sure about tube feeds     Constitutional: Denies fever, malaise, fatigue, headache or abrupt weight changes.  Gastrointestinal: Denies abdominal pain, bloating, constipation, diarrhea or blood in the stool.  GU: Denies urgency, frequency, pain with urination, burning sensation, blood in urine, odor or discharge. Musculoskeletal: Pt reports low back pain. Denies decrease in range of motion, difficulty with gait, muscle pain or joint swelling.  Neurological: Denies dizziness, difficulty with memory, difficulty with speech or problems with balance and  coordination.    No other specific complaints in a complete review of systems (except as listed in HPI above).  Objective:   Physical Exam  BP 130/84   Pulse 85   Temp 98.6 F (37 C) (Oral)   Wt 184 lb 12 oz (83.8 kg)   SpO2 99%   BMI 29.82 kg/m  Wt Readings from Last 3 Encounters:  11/27/16 184 lb 12 oz (83.8 kg)  10/20/16 185 lb (83.9 kg)  09/11/16 187 lb (84.8 kg)    General: Appears her stated age, well developed, well nourished in NAD.  Abdomen: Soft and nontender.  Musculoskeletal: Normal flexion, extension, rotation and lateral bending of the spine. Pain with palpation over the lumbar spine. No pain with palpation of the paralumbar muscles. Strength 5/5 BLE. No difficulty with gait.  Neurological: Alert and oriented. Sensation intact to BLE.  BMET    Component Value Date/Time   NA 140 09/11/2016 1615   K 4.3 09/11/2016 1615   CL 105 09/11/2016 1615   CO2 31 09/11/2016 1615   GLUCOSE 81 09/11/2016 1615   BUN 18 09/11/2016 1615   CREATININE 0.58 09/11/2016 1615   CALCIUM 9.2 09/11/2016 1615   GFRNONAA 99.01 09/13/2010 1036   GFRAA 94 02/23/2009 0824    Lipid Panel     Component Value Date/Time   CHOL 166 12/22/2015 1241   TRIG 76.0 12/22/2015 1241   HDL 53.10 12/22/2015 1241   CHOLHDL 3 12/22/2015 1241   VLDL 15.2 12/22/2015 1241   LDLCALC 98 12/22/2015 1241    CBC    Component Value Date/Time   WBC 6.0 03/07/2016 0855   RBC 4.31 03/07/2016 0855   HGB 11.8 (L) 03/07/2016 0855   HCT 35.9 03/07/2016 0855   PLT 313 03/07/2016 0855   MCV 83.3 03/07/2016 0855   MCH 27.5 03/07/2016 0855   MCHC 33.0 03/07/2016 0855   RDW 13.6 03/07/2016 0855   LYMPHSABS 2.2 12/22/2015 1241   MONOABS 0.3 12/22/2015 1241   EOSABS 0.5 12/22/2015 1241   BASOSABS 0.0 12/22/2015 1241    Hgb A1C Lab Results  Component Value Date   HGBA1C 6.7 (H) 06/21/2016        Assessment & Plan:   Recurrent low back pain:  Like arthritis No need for xray at this  time Advised her to resume stretching exercises Ok to take Tylenol Restart Diclofenac BID- advised her not to take Ibuprofen or Aleve with this If pain persist, will consider xray of low back  RTC as needed or if symptoms persist or worsen Angelyn Osterberg, NP

## 2016-11-27 NOTE — Patient Instructions (Signed)

## 2016-12-04 ENCOUNTER — Ambulatory Visit
Admission: RE | Admit: 2016-12-04 | Discharge: 2016-12-04 | Disposition: A | Discharge status: Home / Self Care | Payer: Medicare Other | Source: Ambulatory Visit | Attending: Internal Medicine | Admitting: Internal Medicine

## 2016-12-04 DIAGNOSIS — D251 Intramural leiomyoma of uterus: Secondary | ICD-10-CM | POA: Insufficient documentation

## 2016-12-08 ENCOUNTER — Telehealth: Payer: Self-pay | Admitting: Internal Medicine

## 2016-12-08 NOTE — Telephone Encounter (Signed)
Patient returned Shannon's call. °

## 2016-12-08 NOTE — Telephone Encounter (Signed)
Spoke to pt about [place] results

## 2016-12-11 ENCOUNTER — Telehealth: Payer: Self-pay

## 2016-12-11 MED ORDER — METHOCARBAMOL 500 MG PO TABS: 500.0000 mg | ORAL_TABLET | Freq: Three times a day (TID) | ORAL | 0 refills | Status: DC | PRN

## 2016-12-11 NOTE — Telephone Encounter (Signed)
Pt has seen Webb Silversmith and Tor Netters in the last 2 months for back pain. She has an appointment with Dr Silvio Pate the 1st week of January. She is asking if Dr Silvio Pate would write a rx for a muscle relaxer until her visit. She said the diclofenac is not helping.

## 2016-12-11 NOTE — Telephone Encounter (Signed)
Please let her know that I sent the prescription for her. Have her let me know if she has any problems with it She should not drive after taking this, until she knows how she reacts to it

## 2016-12-11 NOTE — Telephone Encounter (Signed)
Spoke to pt. She really appreciated the medication

## 2017-01-02 ENCOUNTER — Ambulatory Visit (INDEPENDENT_AMBULATORY_CARE_PROVIDER_SITE_OTHER): Payer: Medicare Other | Admitting: Internal Medicine

## 2017-01-02 ENCOUNTER — Encounter: Payer: Self-pay | Admitting: Internal Medicine

## 2017-01-02 VITALS — BP 126/86 | HR 83 | Temp 97.4°F | Ht 65.25 in | Wt 185.0 lb

## 2017-01-02 DIAGNOSIS — Z0000 Encounter for general adult medical examination without abnormal findings: Secondary | ICD-10-CM

## 2017-01-02 DIAGNOSIS — I10 Essential (primary) hypertension: Secondary | ICD-10-CM

## 2017-01-02 DIAGNOSIS — E119 Type 2 diabetes mellitus without complications: Secondary | ICD-10-CM | POA: Diagnosis not present

## 2017-01-02 DIAGNOSIS — Z7189 Other specified counseling: Secondary | ICD-10-CM

## 2017-01-02 DIAGNOSIS — E538 Deficiency of other specified B group vitamins: Secondary | ICD-10-CM

## 2017-01-02 DIAGNOSIS — E785 Hyperlipidemia, unspecified: Secondary | ICD-10-CM | POA: Diagnosis not present

## 2017-01-02 LAB — COMPREHENSIVE METABOLIC PANEL
ALBUMIN: 4 g/dL (ref 3.5–5.2)
ALT: 193 U/L — ABNORMAL HIGH (ref 0–35)
AST: 121 U/L — AB (ref 0–37)
Alkaline Phosphatase: 55 U/L (ref 39–117)
BUN: 14 mg/dL (ref 6–23)
CALCIUM: 9.9 mg/dL (ref 8.4–10.5)
CHLORIDE: 103 meq/L (ref 96–112)
CO2: 31 mEq/L (ref 19–32)
CREATININE: 0.56 mg/dL (ref 0.40–1.20)
GFR: 138.12 mL/min (ref >60.00)
Glucose, Bld: 100 mg/dL — ABNORMAL HIGH (ref 70–99)
POTASSIUM: 4.2 meq/L (ref 3.5–5.1)
SODIUM: 142 meq/L (ref 135–145)
Total Bilirubin: 0.6 mg/dL (ref 0.2–1.2)
Total Protein: 7.2 g/dL (ref 6.0–8.3)

## 2017-01-02 LAB — CBC WITH DIFFERENTIAL/PLATELET
Basophils Absolute: 0.1 10*3/uL (ref 0.0–0.1)
Basophils Relative: 0.6 % (ref 0.0–3.0)
EOS ABS: 0.6 10*3/uL (ref 0.0–0.7)
Eosinophils Relative: 6.3 % — ABNORMAL HIGH (ref 0.0–5.0)
HCT: 36.1 % (ref 36.0–46.0)
HEMOGLOBIN: 11.8 g/dL — AB (ref 12.0–15.0)
Lymphocytes Relative: 26.8 % (ref 12.0–46.0)
Lymphs Abs: 2.4 10*3/uL (ref 0.7–4.0)
MCHC: 32.7 g/dL (ref 30.0–36.0)
MCV: 84.7 fl (ref 78.0–100.0)
MONO ABS: 0.4 10*3/uL (ref 0.1–1.0)
Monocytes Relative: 4.8 % (ref 3.0–12.0)
Neutro Abs: 5.4 10*3/uL (ref 1.4–7.7)
Neutrophils Relative %: 61.5 % (ref 43.0–77.0)
Platelets: 390 10*3/uL (ref 150.0–400.0)
RBC: 4.27 Mil/uL (ref 3.87–5.11)
RDW: 14.4 % (ref 11.5–15.5)
WBC: 8.8 10*3/uL (ref 4.0–10.5)

## 2017-01-02 LAB — LIPID PANEL
CHOL/HDL RATIO: 4
CHOLESTEROL: 178 mg/dL (ref 0–200)
HDL: 50.1 mg/dL (ref >39.00)
LDL CALC: 99 mg/dL (ref 0–99)
NonHDL: 127.83
TRIGLYCERIDES: 142 mg/dL (ref 0.0–149.0)
VLDL: 28.4 mg/dL (ref 0.0–40.0)

## 2017-01-02 LAB — HEMOGLOBIN A1C: Hgb A1c MFr Bld: 6.7 % — ABNORMAL HIGH (ref 4.6–6.5)

## 2017-01-02 LAB — T4, FREE: FREE T4: 0.98 ng/dL (ref 0.60–1.60)

## 2017-01-02 LAB — HM DIABETES FOOT EXAM

## 2017-01-02 LAB — VITAMIN B12: Vitamin B-12: 325 pg/mL (ref 211–911)

## 2017-01-02 MED ORDER — METHOCARBAMOL 500 MG PO TABS: 500.0000 mg | ORAL_TABLET | Freq: Three times a day (TID) | ORAL | 1 refills | Status: DC | PRN

## 2017-01-02 NOTE — Assessment & Plan Note (Signed)
BP Readings from Last 3 Encounters:  01/02/17 126/86  11/27/16 130/84  10/20/16 130/80   Good control On ACEI

## 2017-01-02 NOTE — Assessment & Plan Note (Addendum)
Using sublingual If still low, will start injections Stopped 2-3 weeks ago--so need to consider that

## 2017-01-02 NOTE — Progress Notes (Signed)
Pre visit review using our clinic review tool, if applicable. No additional management support is needed unless otherwise documented below in the visit note. 

## 2017-01-02 NOTE — Patient Instructions (Signed)
Please set up your diabetic eye exam. 

## 2017-01-02 NOTE — Assessment & Plan Note (Addendum)
Hasn't tolerated statin If really high, will try another

## 2017-01-02 NOTE — Assessment & Plan Note (Signed)
Still seems to have good control 

## 2017-01-02 NOTE — Progress Notes (Signed)
Subjective:    Patient ID: Bridget Romero, female    DOB: 02/01/1946, 71 y.o.   MRN: [id number]  HPI Here for Medicare wellness and follow up of chronic health conditions Reviewed form and advanced directives Reviewed other doctors No tobacco now--or alcohol Tries to walk regularly Vision is fine---not sure about her last eye exam Hearing is okay---trouble at high frequency now  Back pain is some better Does have some trouble getting up out of chair Methocarbamol did help some Not taking the diclofenac regularly  Energy levels are pretty good Did start OTC sublingual B12  Checks sugars once or twice a day Usually under 130 --except for holidays Overdue for eye exam No sores, pain or numbness in feet  No chest pain or SOB No dizziness or syncope No edema No palpitations No headaches  Current Outpatient Prescriptions on File Prior to Visit  Medication Sig Dispense Refill  . ACCU-CHEK FASTCLIX LANCETS MISC Use to test blood sugar twice daily dx: 250.02 100 each 3  . ACCU-CHEK SMARTVIEW test strip Use as directed to test  blood once daily 100 each 3  . albuterol (PROVENTIL HFA;VENTOLIN HFA) 108 (90 BASE) MCG/ACT inhaler Inhale 2 puffs into the lungs every 6 (six) hours as needed for wheezing or shortness of breath. 2 Inhaler 3  . Alcohol Swabs (B-D SINGLE USE SWABS REGULAR) PADS Use to test blood sugar once daily dx: 250.00 100 each 3  . Blood Pressure Monitoring (BLOOD PRESSURE CUFF) MISC Measure blood pressure once daily as directed. 1 each 0  . Cholecalciferol (VITAMIN D) 1000 UNITS capsule Take 1,000 Units by mouth daily.      . diclofenac (VOLTAREN) 50 MG EC tablet Take 1 tablet (50 mg total) by mouth 2 (two) times daily as needed. For arthritis pain 60 tablet 0  . fluticasone (FLOVENT HFA) 110 MCG/ACT inhaler Inhale 2 puffs into the lungs daily. 12 g 3  . lisinopril (PRINIVIL,ZESTRIL) 10 MG tablet Take 1 tablet (10 mg total) by mouth daily. 30 tablet 11  . metFORMIN  (GLUCOPHAGE-XR) 500 MG 24 hr tablet take 3 tablets by mouth every morning WITH BREAKFAST 270 tablet 3  . methocarbamol (ROBAXIN) 500 MG tablet Take 1 tablet (500 mg total) by mouth every 8 (eight) hours as needed for muscle spasms. 30 tablet 0  . Multiple Vitamin (MULTIVITAMIN) tablet Take 1 tablet by mouth daily.      Marland Kitchen nystatin (MYCOSTATIN) powder Apply topically 2 (two) times daily. 60 g 3   No current facility-administered medications on file prior to visit.     Allergies  Allergen Reactions  . Atorvastatin Other (See Comments)    myalgias  . Pravastatin Other (See Comments)    Muscle pain    Past Medical History:  Diagnosis Date  . Allergy   . Asthma   . Diabetes mellitus   . Hyperlipidemia   . Osteoarthritis   . Vitamin B12 deficiency     Past Surgical History:  Procedure Laterality Date  . COLONOSCOPY WITH PROPOFOL N/A 03/07/2016   Procedure: COLONOSCOPY WITH PROPOFOL;  Surgeon: Lollie Sails, MD;  Location: [facility] ENDOSCOPY;  Service: Endoscopy;  Laterality: N/A;    Family History  Problem Relation Age of Onset  . Heart disease Mother   . Cancer Father     Social History   Social History  . Marital status: Widowed    Spouse name: N/A  . Number of children: 2  . Years of education: N/A   Occupational  History  . Retired as Training and development officer at [employer]  . Smoking status: Former Research scientist (life sciences)  . Smokeless tobacco: Never Used  . Alcohol use No  . Drug use: No  . Sexual activity: Not on file   Other Topics Concern  . Not on file   Social History Narrative   No living will   Son and daughter should make health care decisions for her   Would accept resuscitation   Not sure about tube feeds   Review of Systems Sleep is fairly good Appetite is good Weight is stable Wears seat belt Teeth are okay--overdue for dentist Bowels are okay. No blood. Voids okay--no blood or pain No rash or suspicious skin  lesions No heartburn or dysphagia    Objective:   Physical Exam  Constitutional: She is oriented to person, place, and time. She appears well-developed and well-nourished. No distress.  HENT:  Mouth/Throat: Oropharynx is clear and moist. No oropharyngeal exudate.  Upper denture  Neck: Normal range of motion. Neck supple. No thyromegaly present.  Cardiovascular: Normal rate, regular rhythm, normal heart sounds and intact distal pulses.  Exam reveals no gallop.   No murmur heard. Pulmonary/Chest: Effort normal and breath sounds normal. No respiratory distress. She has no wheezes. She has no rales.  Abdominal: Soft. There is no tenderness.  Musculoskeletal: She exhibits no edema or tenderness.  Lymphadenopathy:    She has no cervical adenopathy.  Neurological: She is alert and oriented to person, place, and time.  President--- "Dwaine Deter, [place]---- ?" 100-93- I've never been good in math D-l-[id number] Recall 2/3  Normal sensation in plantar feet   Skin:  No foot lesions  Psychiatric: She has a normal mood and affect. Her behavior is normal.          Assessment & Plan:

## 2017-01-02 NOTE — Assessment & Plan Note (Signed)
Ready to do form--needs to get to notary

## 2017-01-02 NOTE — Assessment & Plan Note (Signed)
I have personally reviewed the Medicare Annual Wellness questionnaire and have noted 1. The patient's medical and social history 2. Their use of alcohol, tobacco or illicit drugs 3. Their current medications and supplements 4. The patient's functional ability including ADL's, fall risks, home safety risks and hearing or visual             impairment. 5. Diet and physical activities 6. Evidence for depression or mood disorders  The patients weight, height, BMI and visual acuity have been recorded in the chart I have made referrals, counseling and provided education to the patient based review of the above and I have provided the pt with a written personalized care plan for preventive services.  I have provided you with a copy of your personalized plan for preventive services. Please take the time to review along with your updated medication list.  Prefers no flu shot Recent colon and mammogram No pap due to age Discussed going to gym or Y

## 2017-01-03 ENCOUNTER — Telehealth: Payer: Self-pay | Admitting: Internal Medicine

## 2017-01-03 ENCOUNTER — Other Ambulatory Visit: Payer: Self-pay | Admitting: Internal Medicine

## 2017-01-03 DIAGNOSIS — R945 Abnormal results of liver function studies: Principal | ICD-10-CM

## 2017-01-03 DIAGNOSIS — R7989 Other specified abnormal findings of blood chemistry: Secondary | ICD-10-CM

## 2017-01-03 NOTE — Telephone Encounter (Signed)
Patient returned Shannon's call. °

## 2017-01-04 NOTE — Telephone Encounter (Signed)
See labs. Spoke to pt

## 2017-01-08 ENCOUNTER — Telehealth: Payer: Self-pay | Admitting: *Deleted

## 2017-01-08 ENCOUNTER — Other Ambulatory Visit (INDEPENDENT_AMBULATORY_CARE_PROVIDER_SITE_OTHER): Payer: Medicare Other

## 2017-01-08 DIAGNOSIS — R945 Abnormal results of liver function studies: Principal | ICD-10-CM

## 2017-01-08 DIAGNOSIS — R7989 Other specified abnormal findings of blood chemistry: Secondary | ICD-10-CM | POA: Diagnosis not present

## 2017-01-08 NOTE — Telephone Encounter (Signed)
Patient left a voicemail stating that the medication given for her back is not working. Patient wants to know if she should have a back x-ray?

## 2017-01-08 NOTE — Telephone Encounter (Signed)
Spoke to pt. Made appt with Dr Lorelei Pont 01-11-17.

## 2017-01-08 NOTE — Telephone Encounter (Signed)
I would recommend an evaluation by Dr Lorelei Pont --the sports medicine specialist. He could decide about x-rays if needed. Please set up if she is willing

## 2017-01-09 ENCOUNTER — Other Ambulatory Visit: Payer: Medicare Other

## 2017-01-09 LAB — HEPATITIS C ANTIBODY: HCV Ab: NEGATIVE

## 2017-01-11 ENCOUNTER — Ambulatory Visit: Payer: Medicare Other | Admitting: Family Medicine

## 2017-01-15 ENCOUNTER — Ambulatory Visit (INDEPENDENT_AMBULATORY_CARE_PROVIDER_SITE_OTHER): Payer: Medicare Other | Admitting: Family Medicine

## 2017-01-15 ENCOUNTER — Encounter: Payer: Self-pay | Admitting: Family Medicine

## 2017-01-15 ENCOUNTER — Ambulatory Visit (INDEPENDENT_AMBULATORY_CARE_PROVIDER_SITE_OTHER)
Admission: RE | Admit: 2017-01-15 | Discharge: 2017-01-15 | Disposition: A | Discharge status: Home / Self Care | Payer: Medicare Other | Source: Ambulatory Visit | Attending: Family Medicine | Admitting: Family Medicine

## 2017-01-15 VITALS — BP 120/78 | HR 88 | Temp 98.4°F | Ht 65.25 in | Wt 186.2 lb

## 2017-01-15 DIAGNOSIS — R29898 Other symptoms and signs involving the musculoskeletal system: Secondary | ICD-10-CM | POA: Diagnosis not present

## 2017-01-15 DIAGNOSIS — M545 Low back pain, unspecified: Secondary | ICD-10-CM

## 2017-01-15 NOTE — Patient Instructions (Signed)

## 2017-01-15 NOTE — Progress Notes (Signed)
Pre visit review using our clinic review tool, if applicable. No additional management support is needed unless otherwise documented below in the visit note. 

## 2017-01-15 NOTE — Progress Notes (Signed)
Dr. Frederico Hamman T. Chanika Byland, MD, [facility] and Sports Medicine [place], [address] Phone: [phone] Fax: [phone]  01/15/2017  Patient: Bridget Romero, MRN: [id number], DOB: 12/31/1945, 71 y.o.  Primary Physician:  Viviana Simpler, MD   Chief Complaint  Patient presents with  . Back Pain    Has a hard time getting up from sitting/hurts to go up stars/has to lift leg to get in car  . Fatigue   Subjective:   Bridget Romero is a 71 y.o. very pleasant female patient who presents with the following: Back Pain  ongoing for approximately: at least 2 months. The patient has had back pain before. The back pain is localized into the lumbar spine area. They also describe no radiculopathy.  Hard to get out of a chair and getting in and out of her car and other seats.  Pain in the l buttocks.  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  Currently she is not very active.  No numbness or tingling. No bowel or bladder incontinence. No focal weakness. Prior interventions: meds only - robaxin, voltaren, otc meds Physical therapy: No Chiropractic manipulations: No Acupuncture: No Osteopathic manipulation: No Heat or cold: Minimal effect  Past Medical History, Surgical History, Family History, Medications, Allergies have been reviewed and updated if relevant.  Patient Active Problem List   Diagnosis Date Noted  . Vitamin B12 deficiency   . Essential hypertension 03/01/2016  . Right-sided low back pain without sciatica 12/22/2015  . Advanced directives, counseling/discussion 12/09/2014  . Routine general medical examination at a health care facility 09/22/2011  . Hyperlipidemia   . Sleep disturbance 09/08/2009  . NECK PAIN, CHRONIC 07/06/2009  . Osteoarthritis, multiple sites 03/04/2008  . Diabetes mellitus type 2, controlled, without complications ([place]) 99991111  . ALLERGIC RHINITIS 08/22/2007  . Allergic asthma 08/22/2007    Past  Medical History:  Diagnosis Date  . Allergy   . Asthma   . Diabetes mellitus   . Hyperlipidemia   . Osteoarthritis   . Vitamin B12 deficiency     Past Surgical History:  Procedure Laterality Date  . COLONOSCOPY WITH PROPOFOL N/A 03/07/2016   Procedure: COLONOSCOPY WITH PROPOFOL;  Surgeon: Lollie Sails, MD;  Location: [facility] ENDOSCOPY;  Service: Endoscopy;  Laterality: N/A;    Social History   Social History  . Marital status: Widowed    Spouse name: N/A  . Number of children: 2  . Years of education: N/A   Occupational History  . Retired as Training and development officer at [employer]  . Smoking status: Former Research scientist (life sciences)  . Smokeless tobacco: Never Used  . Alcohol use No  . Drug use: No  . Sexual activity: Not on file   Other Topics Concern  . Not on file   Social History Narrative   No living will   Son and daughter should make health care decisions for her   Would accept resuscitation   Not sure about tube feeds    Family History  Problem Relation Age of Onset  . Heart disease Mother   . Cancer Father     Allergies  Allergen Reactions  . Atorvastatin Other (See Comments)    myalgias  . Pravastatin Other (See Comments)    Muscle pain    Medication list reviewed and updated in full in [place].  GEN: No fevers, chills. Nontoxic. Primarily MSK c/o today. MSK:  Detailed in the HPI GI: tolerating PO intake without difficulty Neuro: As above  Otherwise the pertinent positives of the ROS are noted above.    Objective:   Blood pressure 120/78, pulse 88, temperature 98.4 F (36.9 C), temperature source Oral, height 5' 5.25" (1.657 m), weight 186 lb 4 oz (84.5 kg).  Gen: Well-developed,well-nourished,in no acute distress; alert,appropriate and cooperative throughout examination HEENT: Normocephalic and atraumatic without obvious abnormalities.  Ears, externally no deformities Pulm: Breathing comfortably in no  respiratory distress Range of motion at  the waist: Flexion, rotation and lateral bending: excellent  No echymosis or edema Rises to examination table with no difficulty Gait: minimally antalgic  Inspection/Deformity: No abnormality Paraspinus T:  Minimally painful l4-s1  B Ankle Dorsiflexion (L5,4): 5/5 B Great Toe Dorsiflexion (L5,4): 5/5 Heel Walk (L5): WNL Toe Walk (S1): WNL Rise/Squat (L4): WNL, mild pain  SENSORY B Medial Foot (L4): WNL B Dorsum (L5): WNL B Lateral (S1): WNL Light Touch: WNL Pinprick: WNL  REFLEXES Knee (L4): 2+ Ankle (S1): 2+  B SLR, seated: neg B SLR, supine: neg B FABER: neg B Reverse FABER: neg B Greater Troch: mild ttp B B Log Roll: neg B Sciatic Notch: NT  HIP EXAM: SIDE: b ROM: Abduction, Flexion, Internal and External range of motion: full Pain with terminal IROM and EROM: minimal at terminal IROM GTB: mild ttp SLR: NEG Knees: No effusion FABER: NT REVERSE FABER: NT, neg Piriformis: mild-mod tender at direct palpation  Str: flexion: 3/5 abduction: 3/5 adduction: 3+/5 Strength testing tender  Radiology: Dg Lumbar Spine Complete  Result Date: 01/15/2017 CLINICAL DATA:  Low back pain for 2 months. EXAM: LUMBAR SPINE - COMPLETE 4+ VIEW COMPARISON:  None. FINDINGS: No fracture.  No spondylolisthesis. Mild loss of disc height at L4-L5. Remaining disc spaces are well preserved. There are minor endplate osteophytes from the lower thoracic spine through L4. Soft tissues are unremarkable. IMPRESSION: 1. No fracture, spondylolisthesis or acute finding. 2. Mild L4-L5 disc degenerative change. Electronically Signed   By: Lajean Manes M.D.   On: 01/15/2017 10:35    Assessment and Plan:   Acute bilateral low back pain without sciatica - Plan: DG Lumbar Spine Complete, Ambulatory referral to Physical Therapy  Weakness of both hips - Plan: Ambulatory referral to Physical Therapy  >25 minutes spent in face to face time with patient, >50%  spent in counselling or coordination of care   Core strength is remarkably poor. I think this weakness has to effect ability to stand, rise from a chair, get in / out of car, etc. Necessarily will impact back and buttocks pain, particularly with fatigue.   I gave home rehab program, but am also sending to formal PT. This will take some time and work on the patient's part to recover but prognosis is good.  Follow-up: Return in about 6 weeks (around 02/26/2017).  There are no discontinued medications. Orders Placed This Encounter  Procedures  . DG Lumbar Spine Complete  . Ambulatory referral to Physical Therapy    Signed,  Frederico Hamman T. Eleftherios Dudenhoeffer, MD   Allergies as of 01/15/2017      Reactions   Atorvastatin Other (See Comments)   myalgias   Pravastatin Other (See Comments)   Muscle pain      Medication List       Accurate as of 01/15/17 11:59 PM. Always use your most recent med list.          ACCU-CHEK FASTCLIX LANCETS Misc Use to test blood sugar  twice daily dx: 250.02   ACCU-CHEK SMARTVIEW test strip Generic drug:  glucose blood Use as directed to test  blood once daily   albuterol 108 (90 Base) MCG/ACT inhaler Commonly known as:  PROVENTIL HFA;VENTOLIN HFA Inhale 2 puffs into the lungs every 6 (six) hours as needed for wheezing or shortness of breath.   B-D SINGLE USE SWABS REGULAR Pads Use to test blood sugar once daily dx: 250.00   Blood Pressure Cuff Misc Measure blood pressure once daily as directed.   diclofenac 50 MG EC tablet Commonly known as:  VOLTAREN Take 1 tablet (50 mg total) by mouth 2 (two) times daily as needed. For arthritis pain   fluticasone 110 MCG/ACT inhaler Commonly known as:  FLOVENT HFA Inhale 2 puffs into the lungs daily.   lisinopril 10 MG tablet Commonly known as:  PRINIVIL,ZESTRIL Take 1 tablet (10 mg total) by mouth daily.   metFORMIN 500 MG 24 hr tablet Commonly known as:  GLUCOPHAGE-XR take 3 tablets by mouth every morning  WITH BREAKFAST   methocarbamol 500 MG tablet Commonly known as:  ROBAXIN Take 1 tablet (500 mg total) by mouth every 8 (eight) hours as needed for muscle spasms.   multivitamin tablet Take 1 tablet by mouth daily.   nystatin powder Commonly known as:  MYCOSTATIN/NYSTOP Apply topically 2 (two) times daily.   Vitamin B-12 500 MCG Subl Place 500 mcg under the tongue daily.   Vitamin D 1000 units capsule Take 1,000 Units by mouth daily.

## 2017-01-29 ENCOUNTER — Other Ambulatory Visit: Payer: Self-pay | Admitting: Internal Medicine

## 2017-02-26 ENCOUNTER — Ambulatory Visit (INDEPENDENT_AMBULATORY_CARE_PROVIDER_SITE_OTHER): Payer: Medicare Other | Admitting: Family Medicine

## 2017-02-26 ENCOUNTER — Encounter (INDEPENDENT_AMBULATORY_CARE_PROVIDER_SITE_OTHER): Payer: Self-pay

## 2017-02-26 ENCOUNTER — Encounter: Payer: Self-pay | Admitting: Family Medicine

## 2017-02-26 VITALS — BP 130/80 | HR 86 | Temp 97.6°F | Ht 61.5 in | Wt 188.5 lb

## 2017-02-26 DIAGNOSIS — M545 Low back pain, unspecified: Secondary | ICD-10-CM

## 2017-02-26 DIAGNOSIS — R29898 Other symptoms and signs involving the musculoskeletal system: Secondary | ICD-10-CM | POA: Diagnosis not present

## 2017-02-26 MED ORDER — TRAMADOL HCL 50 MG PO TABS: 50.0000 mg | ORAL_TABLET | Freq: Four times a day (QID) | ORAL | 2 refills | Status: AC | PRN

## 2017-02-26 NOTE — Progress Notes (Signed)
Pre visit review using our clinic review tool, if applicable. No additional management support is needed unless otherwise documented below in the visit note. 

## 2017-02-26 NOTE — Progress Notes (Signed)
Dr. Frederico Hamman T. Asbury Hair, MD, [facility] Primary [facility] and Sports Medicine [place], [address] Phone: [phone] Fax: [phone]  02/26/2017  Patient: Bridget Romero, MRN: [id number], DOB: 21-Oct-1946, 71 y.o.  Primary Physician:  Viviana Simpler, MD   Chief Complaint  Patient presents with  . Follow-up    Back/hip Pain   Subjective:   Bridget Romero is a 71 y.o. very pleasant female patient who presents with the following:  F/u 3 months of LBP.   L lateral hip is hurting some.  Hard time putting, hard time with getting in and out  50% better.   Being diligent with HEP.  She has been getting stronger and now is improving in pain levels.   Past Medical History, Surgical History, Social History, Family History, Problem List, Medications, and Allergies have been reviewed and updated if relevant.  Patient Active Problem List   Diagnosis Date Noted  . Vitamin B12 deficiency   . Essential hypertension 03/01/2016  . Right-sided low back pain without sciatica 12/22/2015  . Advanced directives, counseling/discussion 12/09/2014  . Routine general medical examination at a health care facility 09/22/2011  . Hyperlipidemia   . Sleep disturbance 09/08/2009  . NECK PAIN, CHRONIC 07/06/2009  . Osteoarthritis, multiple sites 03/04/2008  . Diabetes mellitus type 2, controlled, without complications ([place]) 99991111  . ALLERGIC RHINITIS 08/22/2007  . Allergic asthma 08/22/2007    Past Medical History:  Diagnosis Date  . Allergy   . Asthma   . Diabetes mellitus   . Hyperlipidemia   . Osteoarthritis   . Vitamin B12 deficiency     Past Surgical History:  Procedure Laterality Date  . COLONOSCOPY WITH PROPOFOL N/A 03/07/2016   Procedure: COLONOSCOPY WITH PROPOFOL;  Surgeon: Lollie Sails, MD;  Location: [facility] ENDOSCOPY;  Service: Endoscopy;  Laterality: N/A;    Social History   Social History  . Marital status: Widowed    Spouse name: N/A    . Number of children: 2  . Years of education: N/A   Occupational History  . Retired as Training and development officer at [employer]  . Smoking status: Former Research scientist (life sciences)  . Smokeless tobacco: Never Used  . Alcohol use No  . Drug use: No  . Sexual activity: Not on file   Other Topics Concern  . Not on file   Social History Narrative   No living will   Son and daughter should make health care decisions for her   Would accept resuscitation   Not sure about tube feeds    Family History  Problem Relation Age of Onset  . Heart disease Mother   . Cancer Father     Allergies  Allergen Reactions  . Atorvastatin Other (See Comments)    myalgias  . Pravastatin Other (See Comments)    Muscle pain    Medication list reviewed and updated in full in [place].  GEN: No fevers, chills. Nontoxic. Primarily MSK c/o today. MSK: Detailed in the HPI GI: tolerating PO intake without difficulty Neuro: No numbness, parasthesias, or tingling associated. Otherwise the pertinent positives of the ROS are noted above.   Objective:   BP 130/80   Pulse 86   Temp 97.6 F (36.4 C) (Oral)   Ht 5' 1.5" (1.562 m)   Wt 188 lb 8 oz (85.5 kg)   BMI 35.04 kg/m    GEN: Well-developed,well-nourished,in no acute distress;  alert,appropriate and cooperative throughout examination HEENT: Normocephalic and atraumatic without obvious abnormalities. Ears, externally no deformities PULM: Breathing comfortably in no respiratory distress EXT: No clubbing, cyanosis, or edema PSYCH: Normally interactive. Cooperative during the interview. Pleasant. Friendly and conversant. Not anxious or depressed appearing. Normal, full affect.  Range of motion at  the waist: Flexion: normal Extension: normal Lateral bending: normal Rotation: all normal  No echymosis or edema Rises to examination table with no difficulty Gait: non antalgic  Inspection/Deformity: N Paraspinus  Tenderness: l3-s1 b  B Ankle Dorsiflexion (L5,4): 5/5 B Great Toe Dorsiflexion (L5,4): 5/5 Heel Walk (L5): WNL Toe Walk (S1): WNL Rise/Squat (L4): WNL  SENSORY B Medial Foot (L4): WNL B Dorsum (L5): WNL B Lateral (S1): WNL Light Touch: WNL Pinprick: WNL  REFLEXES Knee (L4): 2+ Ankle (S1): 2+  B SLR, seated: neg B SLR, supine: neg B FABER: neg B Reverse FABER: neg B Greater Troch: NT B Log Roll: neg B Stork: NT B Sciatic Notch: NT   HIP EXAM: SIDE: b Str: flexion: 3++/5 abduction: 4/5 adduction: 4/5 Strength testing non-tender     Radiology: No results found.  Assessment and Plan:   Acute bilateral low back pain without sciatica  Weakness of both hips  Good improvement in interval strength and functional improvement.  Will need some HEP lifelong for hip and spine support  Good progress  Follow-up: prn only unless difficulties  Meds ordered this encounter  Medications  . Cyanocobalamin (VITAMIN B-12 PO)    Sig: Take 1,200 mcg by mouth daily.  . traMADol (ULTRAM) 50 MG tablet    Sig: Take 1 tablet (50 mg total) by mouth every 6 (six) hours as needed.    Dispense:  50 tablet    Refill:  2   Medications Discontinued During This Encounter  Medication Reason  . Cyanocobalamin (VITAMIN B-12) 500 MCG SUBL Change in therapy   No orders of the defined types were placed in this encounter.   Signed,  Maud Deed. Chelsi Warr, MD   Allergies as of 02/26/2017      Reactions   Atorvastatin Other (See Comments)   myalgias   Pravastatin Other (See Comments)   Muscle pain      Medication List       Accurate as of 02/26/17 11:59 PM. Always use your most recent med list.          ACCU-CHEK FASTCLIX LANCETS Misc Use to test blood sugar twice daily dx: 250.02   ACCU-CHEK SMARTVIEW test strip Generic drug:  glucose blood Use as directed to test  blood once daily   albuterol 108 (90 Base) MCG/ACT inhaler Commonly known as:  PROVENTIL HFA;VENTOLIN  HFA Inhale 2 puffs into the lungs every 6 (six) hours as needed for wheezing or shortness of breath.   B-D SINGLE USE SWABS REGULAR Pads Use to test blood sugar once daily dx: 250.00   Blood Pressure Cuff Misc Measure blood pressure once daily as directed.   diclofenac 50 MG EC tablet Commonly known as:  VOLTAREN take 1 tablet by mouth twice a day if needed   fluticasone 110 MCG/ACT inhaler Commonly known as:  FLOVENT HFA Inhale 2 puffs into the lungs daily.   lisinopril 10 MG tablet Commonly known as:  PRINIVIL,ZESTRIL Take 1 tablet (10 mg total) by mouth daily.   metFORMIN 500 MG 24 hr tablet Commonly known as:  GLUCOPHAGE-XR take 3 tablets by mouth every morning WITH BREAKFAST   methocarbamol 500 MG tablet Commonly known as:  ROBAXIN Take 1 tablet (500 mg total) by mouth every 8 (eight) hours as needed for muscle spasms.   multivitamin tablet Take 1 tablet by mouth daily.   nystatin powder Commonly known as:  MYCOSTATIN/NYSTOP Apply topically 2 (two) times daily.   traMADol 50 MG tablet Commonly known as:  ULTRAM Take 1 tablet (50 mg total) by mouth every 6 (six) hours as needed.   VITAMIN B-12 PO Take 1,200 mcg by mouth daily.   Vitamin D 1000 units capsule Take 1,000 Units by mouth daily.

## 2017-02-27 LAB — HM DIABETES EYE EXAM

## 2017-03-02 ENCOUNTER — Telehealth: Payer: Self-pay

## 2017-03-02 NOTE — Telephone Encounter (Signed)
Pt should make appt for further eval or await for Dr. Lenn Cal recs.

## 2017-03-02 NOTE — Telephone Encounter (Signed)
Pt left v/m; pt is feeling very tired and pt has been taking B12 energy 1500 tablets but they do not seem effective. Pt wants to know what else can do for tiredness. Pt request cb. [place] annual 01/02/17.

## 2017-03-05 ENCOUNTER — Encounter: Payer: Self-pay | Admitting: Internal Medicine

## 2017-03-05 NOTE — Telephone Encounter (Signed)
I think she needs to come back in for reevaluation and to discuss the elevated liver tests. Further evaluation of this is indicated since she doesn't feel right. I can also check the B12 level to make sure that is okay

## 2017-03-05 NOTE — Telephone Encounter (Signed)
Spoke to pt. Made appt 03-14-17

## 2017-03-12 ENCOUNTER — Telehealth: Payer: Self-pay

## 2017-03-12 DIAGNOSIS — M549 Dorsalgia, unspecified: Secondary | ICD-10-CM

## 2017-03-12 NOTE — Telephone Encounter (Signed)
Pt last seen 02/26/17; pt having problems getting up from the chair;pt having some back pain; pt wants to see orthopedic doctor in [place].

## 2017-03-13 NOTE — Telephone Encounter (Signed)
Referral order placed.

## 2017-03-14 ENCOUNTER — Ambulatory Visit (INDEPENDENT_AMBULATORY_CARE_PROVIDER_SITE_OTHER): Payer: Medicare Other | Admitting: Internal Medicine

## 2017-03-14 ENCOUNTER — Encounter: Payer: Self-pay | Admitting: Internal Medicine

## 2017-03-14 VITALS — BP 122/80 | HR 90 | Temp 97.3°F | Wt 185.0 lb

## 2017-03-14 DIAGNOSIS — R7989 Other specified abnormal findings of blood chemistry: Secondary | ICD-10-CM | POA: Diagnosis not present

## 2017-03-14 DIAGNOSIS — R945 Abnormal results of liver function studies: Principal | ICD-10-CM

## 2017-03-14 DIAGNOSIS — Z23 Encounter for immunization: Secondary | ICD-10-CM | POA: Diagnosis not present

## 2017-03-14 DIAGNOSIS — R29898 Other symptoms and signs involving the musculoskeletal system: Secondary | ICD-10-CM | POA: Insufficient documentation

## 2017-03-14 LAB — SEDIMENTATION RATE: Sed Rate: 18 mm/hr (ref 0–30)

## 2017-03-14 NOTE — Progress Notes (Signed)
Subjective:    Patient ID: Bridget Romero, female    DOB: 1946-11-14, 71 y.o.   MRN: [id number]  HPI Here to review worsened liver function tests Also concerned about weakness in hips---- she is doing HEP and it is helping some  No abdominal pain No N/V Bowels are normal No fever  Current Outpatient Prescriptions on File Prior to Visit  Medication Sig Dispense Refill  . ACCU-CHEK FASTCLIX LANCETS MISC Use to test blood sugar twice daily dx: 250.02 100 each 3  . ACCU-CHEK SMARTVIEW test strip Use as directed to test  blood once daily 100 each 3  . albuterol (PROVENTIL HFA;VENTOLIN HFA) 108 (90 BASE) MCG/ACT inhaler Inhale 2 puffs into the lungs every 6 (six) hours as needed for wheezing or shortness of breath. 2 Inhaler 3  . Alcohol Swabs (B-D SINGLE USE SWABS REGULAR) PADS Use to test blood sugar once daily dx: 250.00 100 each 3  . Blood Pressure Monitoring (BLOOD PRESSURE CUFF) MISC Measure blood pressure once daily as directed. 1 each 0  . Cholecalciferol (VITAMIN D) 1000 UNITS capsule Take 1,000 Units by mouth daily.      . Cyanocobalamin (VITAMIN B-12 PO) Take 1,200 mcg by mouth daily.    . diclofenac (VOLTAREN) 50 MG EC tablet take 1 tablet by mouth twice a day if needed 60 tablet 5  . fluticasone (FLOVENT HFA) 110 MCG/ACT inhaler Inhale 2 puffs into the lungs daily. 12 g 3  . lisinopril (PRINIVIL,ZESTRIL) 10 MG tablet Take 1 tablet (10 mg total) by mouth daily. 30 tablet 11  . metFORMIN (GLUCOPHAGE-XR) 500 MG 24 hr tablet take 3 tablets by mouth every morning WITH BREAKFAST 270 tablet 3  . Multiple Vitamin (MULTIVITAMIN) tablet Take 1 tablet by mouth daily.      Marland Kitchen nystatin (MYCOSTATIN) powder Apply topically 2 (two) times daily. 60 g 3   No current facility-administered medications on file prior to visit.     Allergies  Allergen Reactions  . Atorvastatin Other (See Comments)    myalgias  . Pravastatin Other (See Comments)    Muscle pain    Past Medical History:    Diagnosis Date  . Allergy   . Asthma   . Diabetes mellitus   . Hyperlipidemia   . Osteoarthritis   . Vitamin B12 deficiency     Past Surgical History:  Procedure Laterality Date  . COLONOSCOPY WITH PROPOFOL N/A 03/07/2016   Procedure: COLONOSCOPY WITH PROPOFOL;  Surgeon: Lollie Sails, MD;  Location: [facility] ENDOSCOPY;  Service: Endoscopy;  Laterality: N/A;    Family History  Problem Relation Age of Onset  . Heart disease Mother   . Cancer Father     Social History   Social History  . Marital status: Widowed    Spouse name: N/A  . Number of children: 2  . Years of education: N/A   Occupational History  . Retired as Training and development officer at [employer]  . Smoking status: Former Research scientist (life sciences)  . Smokeless tobacco: Never Used  . Alcohol use No  . Drug use: No  . Sexual activity: Not on file   Other Topics Concern  . Not on file   Social History Narrative   No living will   Son and daughter should make health care decisions for her   Would accept resuscitation   Not sure about tube feeds   Review of Systems Appetite is good Weight stable  Objective:   Physical Exam  Constitutional: She appears well-nourished. No distress.  Abdominal: Soft. She exhibits no distension. There is no tenderness. There is no rebound and no guarding.          Assessment & Plan:

## 2017-03-14 NOTE — Assessment & Plan Note (Signed)
Ongoing though may be some better with exercise None in shoulder area but will check sed rate (for PMR)

## 2017-03-14 NOTE — Progress Notes (Signed)
Pre visit review using our clinic review tool, if applicable. No additional management support is needed unless otherwise documented below in the visit note. 

## 2017-03-14 NOTE — Assessment & Plan Note (Signed)
I suspect fatty liver Viral hepatitis tests are negative No symptoms Will check ultrasound to be sure no other process Hep A/B vaccine

## 2017-03-14 NOTE — Addendum Note (Signed)
Addended by: Pilar Grammes on: 03/14/2017 08:57 AM   Modules accepted: Orders

## 2017-03-16 ENCOUNTER — Ambulatory Visit: Payer: Medicare Other

## 2017-03-29 ENCOUNTER — Ambulatory Visit: Payer: Medicare Other | Admitting: Internal Medicine

## 2017-04-13 ENCOUNTER — Emergency Department
Admission: EM | Admit: 2017-04-13 | Discharge: 2017-04-13 | Disposition: A | Discharge status: Home / Self Care | Payer: Medicare Other | Attending: Emergency Medicine | Admitting: Emergency Medicine

## 2017-04-13 ENCOUNTER — Encounter: Payer: Self-pay | Admitting: Emergency Medicine

## 2017-04-13 DIAGNOSIS — E119 Type 2 diabetes mellitus without complications: Secondary | ICD-10-CM | POA: Diagnosis not present

## 2017-04-13 DIAGNOSIS — M545 Low back pain, unspecified: Secondary | ICD-10-CM

## 2017-04-13 DIAGNOSIS — Z79899 Other long term (current) drug therapy: Secondary | ICD-10-CM | POA: Insufficient documentation

## 2017-04-13 DIAGNOSIS — M5441 Lumbago with sciatica, right side: Secondary | ICD-10-CM | POA: Diagnosis not present

## 2017-04-13 DIAGNOSIS — I10 Essential (primary) hypertension: Secondary | ICD-10-CM | POA: Diagnosis not present

## 2017-04-13 DIAGNOSIS — J45909 Unspecified asthma, uncomplicated: Secondary | ICD-10-CM | POA: Insufficient documentation

## 2017-04-13 DIAGNOSIS — Z7984 Long term (current) use of oral hypoglycemic drugs: Secondary | ICD-10-CM | POA: Diagnosis not present

## 2017-04-13 DIAGNOSIS — Z87891 Personal history of nicotine dependence: Secondary | ICD-10-CM | POA: Diagnosis not present

## 2017-04-13 MED ORDER — HYDROCODONE-ACETAMINOPHEN 5-325 MG PO TABS: 1.0000 | ORAL_TABLET | ORAL | 0 refills | Status: DC | PRN

## 2017-04-13 MED ORDER — HYDROCODONE-ACETAMINOPHEN 5-325 MG PO TABS
ORAL_TABLET | ORAL | Status: AC
  Administered 2017-04-13: 1 via ORAL
  Filled 2017-04-13: qty 1

## 2017-04-13 MED ORDER — HYDROCODONE-ACETAMINOPHEN 5-325 MG PO TABS
1.0000 | ORAL_TABLET | Freq: Once | ORAL | Status: AC
  Administered 2017-04-13: 1 via ORAL

## 2017-04-13 NOTE — ED Provider Notes (Signed)
[facility] Emergency Department Provider Note  ____________________________________________   First MD Initiated Contact with Patient 04/13/17 1946     (approximate)  I have reviewed the triage vital signs and the nursing notes.   HISTORY  Chief Complaint Back Pain    HPI Bridget Romero is a 71 y.o. female who comes to the emergency department with right greater than left aching low back pain. 2 months ago she was diagnosed with degenerative disc disease in her low back and has been followed by orthopedics at [facility] clinic.  She has been taking gabapentin and diclofenac with minimal relief. There is no change in her pain today she just comes to the emergency department requesting "an injection or something to help with my pain". She has no numbness or weakness. Normal perianal sensation. No fevers or chills. She feels it is difficult to get up from standing. She has not fallen. She lives alone and she is worried that she will fall.   Past Medical History:  Diagnosis Date  . Allergy   . Asthma   . Diabetes mellitus   . Hyperlipidemia   . Osteoarthritis   . Vitamin B12 deficiency     Patient Active Problem List   Diagnosis Date Noted  . Hip weakness 03/14/2017  . Vitamin B12 deficiency   . Essential hypertension 03/01/2016  . Elevated liver function tests 01/12/2016  . Right-sided low back pain without sciatica 12/22/2015  . Advanced directives, counseling/discussion 12/09/2014  . Routine general medical examination at a health care facility 09/22/2011  . Hyperlipidemia   . Sleep disturbance 09/08/2009  . NECK PAIN, CHRONIC 07/06/2009  . Osteoarthritis, multiple sites 03/04/2008  . Diabetes mellitus type 2, controlled, without complications ([place]) 63/89/3734  . ALLERGIC RHINITIS 08/22/2007  . Allergic asthma 08/22/2007    Past Surgical History:  Procedure Laterality Date  . COLONOSCOPY WITH PROPOFOL N/A 03/07/2016   Procedure:  COLONOSCOPY WITH PROPOFOL;  Surgeon: Lollie Sails, MD;  Location: [facility] ENDOSCOPY;  Service: Endoscopy;  Laterality: N/A;    Prior to Admission medications   Medication Sig Start Date End Date Taking? Authorizing Provider  ACCU-CHEK FASTCLIX LANCETS MISC Use to test blood sugar twice daily dx: 250.02 06/11/14   Ria Bush, MD  ACCU-CHEK SMARTVIEW test strip Use as directed to test  blood once daily 06/29/16   [name], MD  albuterol (PROVENTIL HFA;VENTOLIN HFA) 108 (90 BASE) MCG/ACT inhaler Inhale 2 puffs into the lungs every 6 (six) hours as needed for wheezing or shortness of breath. 06/09/15   [name], MD  Alcohol Swabs (B-D SINGLE USE SWABS REGULAR) PADS Use to test blood sugar once daily dx: 250.00 01/16/14   [name], MD  Blood Pressure Monitoring (BLOOD PRESSURE CUFF) MISC Measure blood pressure once daily as directed. 03/01/16   Pleas Koch, NP  Cholecalciferol (VITAMIN D) 1000 UNITS capsule Take 1,000 Units by mouth daily.      Historical Provider, MD  Cyanocobalamin (VITAMIN B-12 PO) Take 1,200 mcg by mouth daily.    Historical Provider, MD  diclofenac (VOLTAREN) 50 MG EC tablet take 1 tablet by mouth twice a day if needed 01/29/17   [name], MD  fluticasone (FLOVENT HFA) 110 MCG/ACT inhaler Inhale 2 puffs into the lungs daily. 06/21/16   [name], MD  HYDROcodone-acetaminophen (NORCO) 5-325 MG tablet Take 1 tablet by mouth every 4 (four) hours as needed for moderate pain. 04/13/17   Darel Hong, MD  lisinopril (PRINIVIL,ZESTRIL) 10 MG tablet Take 1 tablet (10 mg total) by mouth daily. 06/23/16   [name], MD  metFORMIN (GLUCOPHAGE-XR) 500 MG 24 hr tablet take 3 tablets by mouth every morning WITH BREAKFAST 08/03/16   [name], MD  Multiple Vitamin (MULTIVITAMIN) tablet Take 1 tablet by mouth daily.      Historical Provider, MD  nystatin (MYCOSTATIN) powder Apply topically 2 (two) times daily. 05/30/16   [name], MD    traMADol (ULTRAM) 50 MG tablet Take by mouth every 6 (six) hours as needed.    Historical Provider, MD    Allergies Atorvastatin and Pravastatin  Family History  Problem Relation Age of Onset  . Heart disease Mother   . Cancer Father     Social History Social History  Substance Use Topics  . Smoking status: Former Research scientist (life sciences)  . Smokeless tobacco: Never Used  . Alcohol use No    Review of Systems Constitutional: No fever/chills Eyes: No visual changes. ENT: No sore throat. Cardiovascular: Denies chest pain. Respiratory: Denies shortness of breath. Gastrointestinal: No abdominal pain.  No nausea, no vomiting.  No diarrhea.  No constipation. Genitourinary: Negative for dysuria. Musculoskeletal: Positive for back pain. Skin: Negative for rash. Neurological: Negative for headaches, focal weakness or numbness.  10-point ROS otherwise negative.  ____________________________________________   PHYSICAL EXAM:  VITAL SIGNS: ED Triage Vitals  Enc Vitals Group     BP 04/13/17 1828 (!) 163/73     Pulse Rate 04/13/17 1828 92     Resp 04/13/17 1828 17     Temp 04/13/17 1828 98.3 F (36.8 C)     Temp Source 04/13/17 1828 Oral     SpO2 04/13/17 1828 100 %     Weight 04/13/17 1825 185 lb (83.9 kg)     Height 04/13/17 1825 5\' 7"  (1.702 m)     Head Circumference --      Peak Flow --      Pain Score 04/13/17 1825 4     Pain Loc --      Pain Edu? --      Excl. in [place]? --     Constitutional: Alert and oriented x 4 well appearing nontoxic no diaphoresis speaks in full, clear sentences Eyes: PERRL EOMI. Head: Atraumatic. Nose: No congestion/rhinnorhea. Mouth/Throat: No trismus Neck: No stridor.   Cardiovascular: Normal rate, regular rhythm. Grossly normal heart sounds.  Good peripheral circulation. Respiratory: Normal respiratory effort.  No retractions. Lungs CTAB and moving good air Gastrointestinal: Soft nondistended nontender no rebound no guarding no peritonitis no  McBurney's tenderness negative Rovsing's no costovertebral tenderness negative Murphy's Musculoskeletal: No lower extremity edema   Neurologic:  5 out of 5 hip flexion and hip extension plantar flexion dorsiflexion and knee flexion and knee extension plantar lays was slow antalgic gait but is steady 2+ DTRs no ankle clonus sensation intact to light touch throughout Skin:  Skin is warm, dry and intact. No rash noted. Psychiatric: Mood and affect are normal. Speech and behavior are normal.    ____ ____________________________________________   LABS (all labs ordered are listed, but only abnormal results are displayed)  Labs Reviewed - No data to display   __________________________________________  EKG   ____________________________________________  RADIOLOGY   ____________________________________________   PROCEDURES  Procedure(s) performed: no  Procedures  Critical Care performed: no  ____________________________________________   INITIAL IMPRESSION / ASSESSMENT AND PLAN / ED COURSE  Pertinent labs & imaging results that were available during my care of  the patient were reviewed by me and considered in my medical decision making (see chart for details).  The patient is neurologically intact and sounds like she is having an acute exacerbation of her chronic low back pain. I will give her a short course of opioids refer her back to her orthopedic surgeon. She verbalizes understanding and agreement with the plan.      ____________________________________________   FINAL CLINICAL IMPRESSION(S) / ED DIAGNOSES  Final diagnoses:  Acute right-sided low back pain without sciatica      NEW MEDICATIONS STARTED DURING THIS VISIT:  Discharge Medication List as of 04/13/2017  8:39 PM    START taking these medications   Details  HYDROcodone-acetaminophen (NORCO) 5-325 MG tablet Take 1 tablet by mouth every 4 (four) hours as needed for moderate pain., Starting Fri  04/13/2017, Print         Note:  This document was prepared using Dragon voice recognition software and may include unintentional dictation errors.     Darel Hong, MD 04/14/17 [phone]

## 2017-04-13 NOTE — Discharge Instructions (Signed)
Please follow-up with your primary care physician and your orthopedic surgeon as scheduled for evaluation and continued treatment of her chronic low back pain. Return to the emergency department for any new or worsening symptoms such as if you cannot see fall, or for any other concerns.  It was a pleasure to take care of you today, and thank you for coming to our emergency department.  If you have any questions or concerns before leaving please ask the nurse to grab me and I'm more than happy to go through your aftercare instructions again.  If you were prescribed any opioid pain medication today such as Norco, Vicodin, Percocet, morphine, hydrocodone, or oxycodone please make sure you do not drive when you are taking this medication as it can alter your ability to drive safely.  If you have any concerns once you are home that you are not improving or are in fact getting worse before you can make it to your follow-up appointment, please do not hesitate to call [phone] and come back for further evaluation.  Darel Hong MD

## 2017-04-13 NOTE — ED Triage Notes (Signed)
Pt comes into the ED via POV c/o chronic back pain since February.  Patient present sin NAD at this time with goo gait.  Patient has known history of arthritis in her back.

## 2017-04-17 ENCOUNTER — Other Ambulatory Visit: Payer: Self-pay | Admitting: Orthopedic Surgery

## 2017-04-17 DIAGNOSIS — M545 Low back pain, unspecified: Secondary | ICD-10-CM

## 2017-04-17 DIAGNOSIS — M5136 Other intervertebral disc degeneration, lumbar region: Secondary | ICD-10-CM

## 2017-04-17 DIAGNOSIS — G8929 Other chronic pain: Secondary | ICD-10-CM

## 2017-04-25 ENCOUNTER — Ambulatory Visit
Admission: RE | Admit: 2017-04-25 | Discharge: 2017-04-25 | Disposition: A | Discharge status: Home / Self Care | Payer: Medicare Other | Source: Ambulatory Visit | Attending: Orthopedic Surgery | Admitting: Orthopedic Surgery

## 2017-04-25 DIAGNOSIS — G8929 Other chronic pain: Secondary | ICD-10-CM | POA: Insufficient documentation

## 2017-04-25 DIAGNOSIS — M47816 Spondylosis without myelopathy or radiculopathy, lumbar region: Secondary | ICD-10-CM | POA: Insufficient documentation

## 2017-04-25 DIAGNOSIS — M5136 Other intervertebral disc degeneration, lumbar region: Secondary | ICD-10-CM | POA: Insufficient documentation

## 2017-04-25 DIAGNOSIS — M545 Low back pain, unspecified: Secondary | ICD-10-CM

## 2017-06-01 ENCOUNTER — Ambulatory Visit (INDEPENDENT_AMBULATORY_CARE_PROVIDER_SITE_OTHER): Payer: Medicare Other | Admitting: Internal Medicine

## 2017-06-01 ENCOUNTER — Encounter: Payer: Self-pay | Admitting: Internal Medicine

## 2017-06-01 VITALS — BP 136/90 | HR 87 | Temp 98.1°F | Wt 194.0 lb

## 2017-06-01 DIAGNOSIS — R609 Edema, unspecified: Secondary | ICD-10-CM

## 2017-06-01 MED ORDER — FUROSEMIDE 40 MG PO TABS: 40.0000 mg | ORAL_TABLET | Freq: Every day | ORAL | 3 refills | Status: DC | PRN

## 2017-06-01 MED ORDER — POTASSIUM CHLORIDE CRYS ER 20 MEQ PO TBCR: 20.0000 meq | EXTENDED_RELEASE_TABLET | Freq: Every day | ORAL | 3 refills | Status: DC

## 2017-06-01 NOTE — Assessment & Plan Note (Signed)
Significant calf edema with weight gain I suspect this is related to the muscle weakness and inability to move legs normally Elevated CPK may indicate polymyositis --but work up ongoing No evidence of CHF--but will check echo to be sure no myocardial involvement Has had elevated LFTs---suspect fatty liver but will check ultrasound to be sure no intraabdominal pathology that could be causing the edema Will Rx furosemide 40mg  daily prn (and potassium if used)

## 2017-06-01 NOTE — Progress Notes (Signed)
Subjective:    Patient ID: Bridget Romero, female    DOB: 1946-09-22, 71 y.o.   MRN: [id number]  HPI Here due to increasing edema  Just saw Dr Manuella Ghazi 2 days ago Evaluating significant proximal muscle weakness---ongoing evaluation No pain--just tight from edema Referred to rheumatology Has EMG/NCV pending Reviewed labs-----CPK very high  Has had the edema over the past 1-2 months Weight is up 9# in that time No SOB No chest pain No palpitations No dizziness  Current Outpatient Prescriptions on File Prior to Visit  Medication Sig Dispense Refill  . ACCU-CHEK FASTCLIX LANCETS MISC Use to test blood sugar twice daily dx: 250.02 100 each 3  . ACCU-CHEK SMARTVIEW test strip Use as directed to test  blood once daily 100 each 3  . albuterol (PROVENTIL HFA;VENTOLIN HFA) 108 (90 BASE) MCG/ACT inhaler Inhale 2 puffs into the lungs every 6 (six) hours as needed for wheezing or shortness of breath. 2 Inhaler 3  . Alcohol Swabs (B-D SINGLE USE SWABS REGULAR) PADS Use to test blood sugar once daily dx: 250.00 100 each 3  . Blood Pressure Monitoring (BLOOD PRESSURE CUFF) MISC Measure blood pressure once daily as directed. 1 each 0  . Cholecalciferol (VITAMIN D) 1000 UNITS capsule Take 1,000 Units by mouth daily.      . Cyanocobalamin (VITAMIN B-12 PO) Take 1,200 mcg by mouth daily.    . fluticasone (FLOVENT HFA) 110 MCG/ACT inhaler Inhale 2 puffs into the lungs daily. 12 g 3  . lisinopril (PRINIVIL,ZESTRIL) 10 MG tablet Take 1 tablet (10 mg total) by mouth daily. 30 tablet 11  . metFORMIN (GLUCOPHAGE-XR) 500 MG 24 hr tablet take 3 tablets by mouth every morning WITH BREAKFAST 270 tablet 3  . Multiple Vitamin (MULTIVITAMIN) tablet Take 1 tablet by mouth daily.      Marland Kitchen nystatin (MYCOSTATIN) powder Apply topically 2 (two) times daily. 60 g 3  . traMADol (ULTRAM) 50 MG tablet Take by mouth every 6 (six) hours as needed.     No current facility-administered medications on file prior to visit.      Allergies  Allergen Reactions  . Atorvastatin Other (See Comments)    myalgias  . Pravastatin Other (See Comments)    Muscle pain    Past Medical History:  Diagnosis Date  . Allergy   . Asthma   . Diabetes mellitus   . Hyperlipidemia   . Osteoarthritis   . Vitamin B12 deficiency     Past Surgical History:  Procedure Laterality Date  . COLONOSCOPY WITH PROPOFOL N/A 03/07/2016   Procedure: COLONOSCOPY WITH PROPOFOL;  Surgeon: Lollie Sails, MD;  Location: [facility] ENDOSCOPY;  Service: Endoscopy;  Laterality: N/A;    Family History  Problem Relation Age of Onset  . Heart disease Mother   . Cancer Father     Social History   Social History  . Marital status: Widowed    Spouse name: N/A  . Number of children: 2  . Years of education: N/A   Occupational History  . Retired as Training and development officer at [employer]  . Smoking status: Former Research scientist (life sciences)  . Smokeless tobacco: Never Used  . Alcohol use No  . Drug use: No  . Sexual activity: Not on file   Other Topics Concern  . Not on file   Social History Narrative   No living will   Son and daughter should make health care decisions for  her   Would accept resuscitation   Not sure about tube feeds   Review of Systems Sleeps okay--- flat. No PND Sleeps okay    Objective:   Physical Exam  Constitutional: No distress.  Neck: No thyromegaly present.  Cardiovascular: Normal rate, regular rhythm, normal heart sounds and intact distal pulses.  Exam reveals no gallop.   No murmur heard. Pulmonary/Chest: Effort normal and breath sounds normal. No respiratory distress. She has no wheezes. She has no rales.  Abdominal: Soft. There is no tenderness.  Musculoskeletal:  1-2+ tense edema in calves  Lymphadenopathy:    She has no cervical adenopathy.  Neurological:  Can't even get up on table Marked leg weakness          Assessment & Plan:

## 2017-06-05 ENCOUNTER — Ambulatory Visit: Payer: Medicare Other

## 2017-06-06 ENCOUNTER — Other Ambulatory Visit: Payer: Self-pay | Admitting: Rheumatology

## 2017-06-06 ENCOUNTER — Other Ambulatory Visit: Payer: Medicare Other

## 2017-06-06 DIAGNOSIS — M3322 Polymyositis with myopathy: Secondary | ICD-10-CM

## 2017-06-08 ENCOUNTER — Other Ambulatory Visit: Payer: Self-pay | Admitting: Internal Medicine

## 2017-06-08 ENCOUNTER — Ambulatory Visit (INDEPENDENT_AMBULATORY_CARE_PROVIDER_SITE_OTHER): Payer: Medicare Other

## 2017-06-08 ENCOUNTER — Other Ambulatory Visit: Payer: Self-pay

## 2017-06-08 DIAGNOSIS — R609 Edema, unspecified: Secondary | ICD-10-CM

## 2017-06-08 DIAGNOSIS — I10 Essential (primary) hypertension: Secondary | ICD-10-CM

## 2017-06-11 ENCOUNTER — Ambulatory Visit
Admission: RE | Admit: 2017-06-11 | Discharge: 2017-06-11 | Disposition: A | Discharge status: Home / Self Care | Payer: Medicare Other | Source: Ambulatory Visit | Attending: Rheumatology | Admitting: Rheumatology

## 2017-06-11 DIAGNOSIS — E119 Type 2 diabetes mellitus without complications: Secondary | ICD-10-CM | POA: Insufficient documentation

## 2017-06-11 DIAGNOSIS — K5730 Diverticulosis of large intestine without perforation or abscess without bleeding: Secondary | ICD-10-CM | POA: Insufficient documentation

## 2017-06-11 DIAGNOSIS — R601 Generalized edema: Secondary | ICD-10-CM | POA: Diagnosis not present

## 2017-06-11 DIAGNOSIS — M3322 Polymyositis with myopathy: Secondary | ICD-10-CM

## 2017-06-11 HISTORY — DX: Essential (primary) hypertension: I10

## 2017-06-11 MED ORDER — IOPAMIDOL (ISOVUE-300) INJECTION 61%
100.0000 mL | Freq: Once | INTRAVENOUS | Status: AC | PRN
  Administered 2017-06-11: 100 mL via INTRAVENOUS

## 2017-06-18 ENCOUNTER — Telehealth: Payer: Self-pay

## 2017-06-18 ENCOUNTER — Emergency Department: Payer: Medicare Other

## 2017-06-18 ENCOUNTER — Emergency Department
Admission: EM | Admit: 2017-06-18 | Discharge: 2017-06-18 | Disposition: A | Discharge status: Home / Self Care | Payer: Medicare Other | Attending: Emergency Medicine | Admitting: Emergency Medicine

## 2017-06-18 ENCOUNTER — Encounter: Payer: Self-pay | Admitting: Emergency Medicine

## 2017-06-18 DIAGNOSIS — Z7984 Long term (current) use of oral hypoglycemic drugs: Secondary | ICD-10-CM | POA: Diagnosis not present

## 2017-06-18 DIAGNOSIS — M545 Low back pain: Secondary | ICD-10-CM | POA: Insufficient documentation

## 2017-06-18 DIAGNOSIS — Z79899 Other long term (current) drug therapy: Secondary | ICD-10-CM | POA: Diagnosis not present

## 2017-06-18 DIAGNOSIS — W19XXXA Unspecified fall, initial encounter: Secondary | ICD-10-CM

## 2017-06-18 DIAGNOSIS — J45909 Unspecified asthma, uncomplicated: Secondary | ICD-10-CM | POA: Diagnosis not present

## 2017-06-18 DIAGNOSIS — E119 Type 2 diabetes mellitus without complications: Secondary | ICD-10-CM | POA: Insufficient documentation

## 2017-06-18 DIAGNOSIS — I10 Essential (primary) hypertension: Secondary | ICD-10-CM | POA: Diagnosis not present

## 2017-06-18 DIAGNOSIS — Z87891 Personal history of nicotine dependence: Secondary | ICD-10-CM | POA: Insufficient documentation

## 2017-06-18 NOTE — ED Provider Notes (Signed)
[facility] Emergency Department Provider Note   ____________________________________________   I have reviewed the triage vital signs and the nursing notes.   HISTORY  Chief Complaint Fall and Back Pain    HPI Bridget Romero is a 71 y.o. female presents into the emergency department following a fall she had earlier today. Patient offers no complaints, she denies hitting her head, and no loss of consciousness. Patient has a history of polymyositis and chronic lumbar back pain. Patient reports a recent decline in functional strength making sit-to-stand more difficult contributing to her fall today. Patient is followed by Dr. Manuella Ghazi for her polymyositis. Patient would like information on a [employer]. Patient denies fever, chills, headache, vision changes, chest pain, chest tightness, shortness of breath, abdominal pain, nausea and vomiting.  Past Medical History:  Diagnosis Date  . Allergy   . Asthma   . Diabetes mellitus   . Hyperlipidemia   . Hypertension   . Osteoarthritis   . Vitamin B12 deficiency     Patient Active Problem List   Diagnosis Date Noted  . Edema 06/01/2017  . Hip weakness 03/14/2017  . Vitamin B12 deficiency   . Essential hypertension 03/01/2016  . Elevated liver function tests 01/12/2016  . Right-sided low back pain without sciatica 12/22/2015  . Advanced directives, counseling/discussion 12/09/2014  . Routine general medical examination at a health care facility 09/22/2011  . Hyperlipidemia   . Sleep disturbance 09/08/2009  . NECK PAIN, CHRONIC 07/06/2009  . Osteoarthritis, multiple sites 03/04/2008  . Diabetes mellitus type 2, controlled, without complications ([place]) 74/[id number]  . ALLERGIC RHINITIS 08/22/2007  . Allergic asthma 08/22/2007    Past Surgical History:  Procedure Laterality Date  . COLONOSCOPY WITH PROPOFOL N/A 03/07/2016   Procedure: COLONOSCOPY WITH PROPOFOL;  Surgeon: Lollie Sails, MD;   Location: [facility] ENDOSCOPY;  Service: Endoscopy;  Laterality: N/A;    Prior to Admission medications   Medication Sig Start Date End Date Taking? Authorizing Provider  ACCU-CHEK FASTCLIX LANCETS MISC Use to test blood sugar twice daily dx: 250.02 06/11/14   Ria Bush, MD  ACCU-CHEK SMARTVIEW test strip Use as directed to test  blood once daily 06/29/16   [name], MD  albuterol (PROVENTIL HFA;VENTOLIN HFA) 108 (90 BASE) MCG/ACT inhaler Inhale 2 puffs into the lungs every 6 (six) hours as needed for wheezing or shortness of breath. 06/09/15   [name], MD  Alcohol Swabs (B-D SINGLE USE SWABS REGULAR) PADS Use to test blood sugar once daily dx: 250.00 01/16/14   [name], MD  Blood Pressure Monitoring (BLOOD PRESSURE CUFF) MISC Measure blood pressure once daily as directed. 03/01/16   Pleas Koch, NP  Cholecalciferol (VITAMIN D) 1000 UNITS capsule Take 1,000 Units by mouth daily.      [provider]  Cyanocobalamin (VITAMIN B-12 PO) Take 1,200 mcg by mouth daily.    [provider]  fluticasone (FLOVENT HFA) 110 MCG/ACT inhaler Inhale 2 puffs into the lungs daily. 06/21/16   [name], MD  furosemide (LASIX) 40 MG tablet Take 1 tablet (40 mg total) by mouth daily as needed. For morning leg swelling 06/01/17   [name], MD  lisinopril (PRINIVIL,ZESTRIL) 10 MG tablet take 1 tablet by mouth once daily 06/08/17   [name], MD  metFORMIN (GLUCOPHAGE-XR) 500 MG 24 hr tablet take 3 tablets by mouth every morning WITH BREAKFAST 08/03/16   [name], MD  Multiple Vitamin (MULTIVITAMIN) tablet  Take 1 tablet by mouth daily.      [provider]  nystatin (MYCOSTATIN) powder Apply topically 2 (two) times daily. 05/30/16   [name], MD  potassium chloride SA (K-DUR,KLOR-CON) 20 MEQ tablet Take 1 tablet (20 mEq total) by mouth daily. When taking furosemide 06/01/17   Viviana Simpler I, MD  traMADol (ULTRAM) 50 MG tablet  Take by mouth every 6 (six) hours as needed.    [provider]    Allergies Atorvastatin and Pravastatin  Family History  Problem Relation Age of Onset  . Heart disease Mother   . Cancer Father     Social History Social History  Substance Use Topics  . Smoking status: Former Research scientist (life sciences)  . Smokeless tobacco: Never Used  . Alcohol use No    Review of Systems Constitutional: Negative for fever/chills Eyes: No visual changes. Cardiovascular: Denies chest pain. Respiratory: Denies cough Denies shortness of breath. Musculoskeletal: Chronic back pain. Neurological: Negative for headaches.  Negative focal weakness or numbness. Negative for loss of consciousness. Able to ambulate. ____________________________________________   PHYSICAL EXAM:  VITAL SIGNS: ED Triage Vitals  Enc Vitals Group     BP 06/18/17 1653 (!) 158/71     Pulse Rate 06/18/17 1653 90     Resp 06/18/17 1653 18     Temp 06/18/17 1653 98.4 F (36.9 C)     Temp Source 06/18/17 1653 Oral     SpO2 06/18/17 1653 100 %     Weight 06/18/17 1654 185 lb (83.9 kg)     Height 06/18/17 1654 5\' 7"  (1.702 m)     Head Circumference --      Peak Flow --      Pain Score 06/18/17 1658 7     Pain Loc --      Pain Edu? --      Excl. in [place]? --     Constitutional: Alert and oriented. Well appearing and in no acute distress.  Head: Normocephalic and atraumatic. Eyes: Conjunctivae are normal. PERRL.  Cardiovascular: Normal rate, regular rhythm. Normal distal pulses. Respiratory: Normal respiratory effort.  Musculoskeletal: Nontender with normal range of motion in all extremities. Neurologic: Normal speech and language. No gross focal neurologic deficits are appreciated. No gait instability. Skin:  Skin is warm, dry and intact. No rash noted. Psychiatric: Mood and affect are normal.  ____________________________________________   LABS (all labs ordered are listed, but only abnormal results are displayed)  Labs  Reviewed - No data to display ____________________________________________  EKG None ____________________________________________  RADIOLOGY none ____________________________________________   PROCEDURES  Procedure(s) performed: no    Critical Care performed: no ____________________________________________   INITIAL IMPRESSION / ASSESSMENT AND PLAN / ED COURSE  Pertinent labs & imaging results that were available during my care of the patient were reviewed by me and considered in my medical decision making (see chart for details).  Patient presented to the emergency department following a fall she had earlier today. Patient offered no complaints and had no injuries. Physical exam, history and vital signs were reassuring. Patient requested information on Lifeline services provided through the hospital. That information was included in her discharge paperwork. Recommended patient follow-up with Dr. Manuella Ghazi regarding recent decline in strength affecting her ability to transfer from sit to stand.  Patient was advised to follow up with PCP as needed and was also advised to return to the emergency department for symptoms that change or worsen.      ____________________________________________   FINAL CLINICAL IMPRESSION(S) /  ED DIAGNOSES  Final diagnoses:  Fall, initial encounter       NEW MEDICATIONS STARTED DURING THIS VISIT:  Discharge Medication List as of 06/18/2017  5:38 PM       Note:  This document was prepared using Dragon voice recognition software and may include unintentional dictation errors.    Jerolyn Shin, PA-C 06/18/17 1757    Darel Hong, MD 06/19/17 1137

## 2017-06-18 NOTE — Telephone Encounter (Signed)
Pt is presently taking prednisone 20 mg three tabs daily. Today FBS 139. Pt is concerned that prednisone has caused BS go up and blurred vision. Dr at [facility] wants to stay on prednisone due to swelling and pt was diagnosed with something; pt said she has a hard time getting up and advised pt not to get up but she had already laid the phone down. After a few mins I heard pt say at a distance oh no. Then pt said she could not get up and requested help. I called Becky Sax pts daughter at [phone] and Becky Sax is on her way to her mothers she said it might take her 10 mins to get there. I am holding on phone until sonja gets to pt. Pt began to say call 911; I called Sonja back and she is still 5 mins out but said to call 911 because the door is locked and either Sonja or EMS will have to break in the door. EMS is on the way. Pt fell trying to get up off couch. Now I can here the first responders but cannot get anyone to answer phone. Sonja's phone goes directly to v/m. I can here pt talking and the first responders. Sonja called me back and  Pt hit head on corner of table when fell. Pt is going to [facility] ED. Sonja appreciative of me calling her and 911. FYI to Dr Silvio Pate.

## 2017-06-18 NOTE — ED Triage Notes (Signed)
Pt states was on the phone earlier today when she fell backwards and landed on her back. Pt states hit the back of her head, denies LOC. C/o lumbar pain at this time. Pt states was dx with polymyositis. Pt is alert and oriented at this time. Pt denies feeling any pops or tears when she fell. Pt states she fell from a sitting position on the couch to the floor.

## 2017-06-18 NOTE — Telephone Encounter (Signed)
She has polymyositis and severe weakness ER evaluation appropriate under the circumstances Prednisone appropriate for initial care of the polymyositis Will follow up based on ER evaluation (there now)

## 2017-06-18 NOTE — Discharge Instructions (Signed)
See attached for [employer] lifeline information.

## 2017-06-18 NOTE — ED Notes (Signed)
See triage note  States she fell backwards and hit her back.

## 2017-06-19 NOTE — Telephone Encounter (Signed)
Patient is taking Prednisone and it's making her blood sugar go up.  Patient wants to know if her Metformin can be increased while she's on the Prednisone.

## 2017-06-19 NOTE — Telephone Encounter (Signed)
Spoke to pt. She is taking 3 metformin daily. I advised her to take 2 twice a day for now. I corrected her med list to show that. She will keep an eye on her blood sugar.

## 2017-06-19 NOTE — Telephone Encounter (Signed)
Confirm she is taking 3 of the 500mg  metformin. If so, she can go up to 4 (may want to spllit it 2 bid) She will likely be on the prednisone for some time---we may need to consider another medication as well

## 2017-06-25 ENCOUNTER — Telehealth: Payer: Self-pay | Admitting: *Deleted

## 2017-06-25 NOTE — Telephone Encounter (Signed)
Spoke to pt. She has an ov coming up.

## 2017-06-25 NOTE — Telephone Encounter (Signed)
Patient left voicemail requesting an order for a hospital bed. Patient stated that she is having a hard time getting out of the bed.

## 2017-06-25 NOTE — Telephone Encounter (Signed)
Please let her know that we can only order a hospital bed if she comes in for a face to face visit to document her need

## 2017-07-02 ENCOUNTER — Telehealth: Payer: Self-pay

## 2017-07-02 DIAGNOSIS — Z7689 Persons encountering health services in other specified circumstances: Secondary | ICD-10-CM

## 2017-07-02 DIAGNOSIS — M3320 Polymyositis, organ involvement unspecified: Secondary | ICD-10-CM | POA: Insufficient documentation

## 2017-07-02 NOTE — Telephone Encounter (Signed)
Sonja (DPR signed) said that pt has lift chair but still has problems getting out of chair and getting in and out of tub for bathing and also problems walking. Pt last fell 06/18/17. Becky Sax has spoken with [facility] and pt needs FL2 to go for some rehab for 2 weeks Becky Sax is with her mother now but no one can stay with her at night and afraid pt is going to fall. Sonja request cb ASAP. Pt was seen 06/01/17 for edema.Please advise.

## 2017-07-02 NOTE — Telephone Encounter (Signed)
I notified Becky Sax FL-2 is ready for pick up and of $20 charge and to let Dr.Letvak know when patient is out of rehab.

## 2017-07-02 NOTE — Telephone Encounter (Signed)
FL-2 done Let them know it can be picked up. She should let me know after she gets out of rehab $20 charge

## 2017-07-02 NOTE — Telephone Encounter (Signed)
Let her know that I can do the FL-2--we will call when it is ready There is a $20 charge (get me a blank copy)

## 2017-07-02 NOTE — Telephone Encounter (Signed)
Blank copies given to Dr Silvio Pate. Left message for Becky Sax to let her know what we were doing.

## 2017-07-03 ENCOUNTER — Ambulatory Visit: Payer: Medicare Other | Admitting: Internal Medicine

## 2017-07-04 ENCOUNTER — Ambulatory Visit: Payer: Medicare Other | Admitting: Internal Medicine

## 2017-07-06 ENCOUNTER — Ambulatory Visit: Payer: Medicare Other | Admitting: Internal Medicine

## 2017-07-10 ENCOUNTER — Other Ambulatory Visit: Payer: Self-pay | Admitting: Internal Medicine

## 2017-07-24 ENCOUNTER — Telehealth: Payer: Self-pay | Admitting: Internal Medicine

## 2017-07-24 NOTE — Telephone Encounter (Signed)
If she remains in [place] shouldn't need another FL-2. She should probably check with the social worker there

## 2017-07-24 NOTE — Telephone Encounter (Signed)
Left message to call office

## 2017-07-24 NOTE — Telephone Encounter (Signed)
Katharine Look Goldblatt,patuent's daughter, returned Shannon's call.  She can be reached at [phone].

## 2017-07-24 NOTE — Telephone Encounter (Signed)
Forms placed in Dr Letvak's Inbox on his desk 

## 2017-07-24 NOTE — Telephone Encounter (Signed)
Pt dropped off FL2 form to be filled out. Wants days extended 20 days.

## 2017-07-24 NOTE — Telephone Encounter (Signed)
Spoke to [place]. She did check with the Education officer, museum. She said she needs the FL2 to be for another 20 days with approval from PCP so that insurance will pickup. Otherwise, there is a copay and they will take her entire check for the month of August. She wants to return home by the end of August.

## 2017-07-25 NOTE — Telephone Encounter (Signed)
Bridget Romero called about paperwork.  Please call her back at [phone].

## 2017-07-25 NOTE — Telephone Encounter (Signed)
Spoke to pt's daughter. Form faxed and original up front

## 2017-07-25 NOTE — Telephone Encounter (Signed)
Okay Please copy everything else from the first one and send it on

## 2017-07-27 ENCOUNTER — Other Ambulatory Visit: Payer: Self-pay | Admitting: Internal Medicine

## 2017-08-10 ENCOUNTER — Telehealth: Payer: Self-pay

## 2017-08-10 DIAGNOSIS — Z7952 Long term (current) use of systemic steroids: Secondary | ICD-10-CM | POA: Diagnosis not present

## 2017-08-10 DIAGNOSIS — Z86010 Personal history of colonic polyps: Secondary | ICD-10-CM | POA: Diagnosis not present

## 2017-08-10 DIAGNOSIS — Z741 Need for assistance with personal care: Secondary | ICD-10-CM | POA: Diagnosis not present

## 2017-08-10 DIAGNOSIS — Z9181 History of falling: Secondary | ICD-10-CM | POA: Diagnosis not present

## 2017-08-10 NOTE — Telephone Encounter (Signed)
Agree with this. Thanks.  

## 2017-08-10 NOTE — Telephone Encounter (Signed)
Verdis Frederickson PT with [facility] left v/m requesting verbal orders for [facility] Pt 1 x a week for 1 week and 2 x a week for 7 weeks for strengthening,balance and gait training. Also HH aide 2 x a week for 6 weeks for showering and dressing.

## 2017-08-13 ENCOUNTER — Other Ambulatory Visit: Payer: Self-pay | Admitting: Internal Medicine

## 2017-08-13 NOTE — Telephone Encounter (Signed)
Spoke to [place] and provided verbal orders

## 2017-08-13 NOTE — Telephone Encounter (Signed)
Patient left message on line. She states that she is now on insulin and needs rx for insulin needles (short) sent to [facility]. [place]

## 2017-08-13 NOTE — Telephone Encounter (Signed)
Please send her anything she needs for the insulin

## 2017-08-15 ENCOUNTER — Ambulatory Visit: Payer: Medicare Other | Admitting: Internal Medicine

## 2017-08-15 MED ORDER — INSULIN SYRINGES (DISPOSABLE) U-100 1 ML MISC: 1.0000 | Freq: Two times a day (BID) | 5 refills | Status: DC

## 2017-08-15 NOTE — Telephone Encounter (Addendum)
Called pt and she states someone from this office called her yesterday in regards to this already. Spoke with Larene Beach and she did speak with pt and will take care of what pt needs

## 2017-08-15 NOTE — Addendum Note (Signed)
Addended by: Pilar Grammes on: 08/15/2017 10:25 AM   Modules accepted: Orders

## 2017-08-15 NOTE — Telephone Encounter (Signed)
rx sent in for insulin syringes

## 2017-08-17 ENCOUNTER — Telehealth: Payer: Self-pay

## 2017-08-17 NOTE — Telephone Encounter (Signed)
Marlowe Kays OT with [facility] left v/m requesting verbal orders for [facility] OT 2 x a week for 6 weeks.

## 2017-08-17 NOTE — Telephone Encounter (Signed)
The doctor at [facility] put her it when she was there. She really does not know why she is on it. She will be glad to stop taking it.

## 2017-08-17 NOTE — Telephone Encounter (Signed)
I did not prescribe the antibiotic. Find out who started her on it, and for what. If she is having trouble with it, she should stop regardless

## 2017-08-17 NOTE — Telephone Encounter (Signed)
Pt left v/m; [facility] - TMP DS 800-160 mg is causing pt to lose her appetite and causing diarrhea and pt request cb with what to do. [facility] [address].

## 2017-08-18 NOTE — Telephone Encounter (Signed)
okay

## 2017-08-20 ENCOUNTER — Telehealth: Payer: Self-pay

## 2017-08-20 NOTE — Telephone Encounter (Signed)
Left detailed message on Vm for [facility] with Verbal orders

## 2017-08-20 NOTE — Telephone Encounter (Signed)
lantus comes in a pen but I don't think the R does. Will need to check with pharmacist May able to adjust (increase lantus) and stop the regular (but would need to review her readings, etc)

## 2017-08-20 NOTE — Telephone Encounter (Signed)
Pt is having problems injecting insulin since pt is left handed and pt request pens instead of using syringes. Pt is presently taking Lantus  20 U at HS prn if BS is elevated and pt taking Humulin R  U-100 taking 8 units if BS > 70. [address]Please advise. Last annual 01/02/17.

## 2017-08-21 NOTE — Telephone Encounter (Signed)
There is a Humulin R U-500 Pen. Not sure if you would be interested in that.

## 2017-08-22 NOTE — Telephone Encounter (Signed)
Spoke to pt. She has an appt 08-29-17. She will keep a log of her blood sugars and when she takes the insulin and how much.

## 2017-08-22 NOTE — Telephone Encounter (Signed)
The insulin must have been started in rehab and I don't think she is taking the regular in a good way. I need to see her in the office and review all her insulin records for at least a week----she should put down every dose she is taking so I can decide on an appropriate lantus dose. She should not take more than 10 units of regular at a time since it is more risky to have a low sugar reaction than occasional high ones

## 2017-08-22 NOTE — Telephone Encounter (Signed)
No---she can't use the U-500. See if she absolutely has to use the pain, and how much R she uses (may be able to stop that)

## 2017-08-22 NOTE — Telephone Encounter (Signed)
Spoke to pt. She said if her blood sugar is over 70 she takes 8 units. I asked her the rest of the scale but she could not tell me. She said her blood sugar last night was over 200 and she took 20 units and it was 54 this morning. She said she takes her metformin 1 in the morning and 2 in the evening. She is willing to stop the insulin if needed.

## 2017-08-29 ENCOUNTER — Ambulatory Visit: Payer: Medicare Other | Admitting: Internal Medicine

## 2017-08-29 ENCOUNTER — Encounter (INDEPENDENT_AMBULATORY_CARE_PROVIDER_SITE_OTHER): Payer: Self-pay

## 2017-08-29 ENCOUNTER — Encounter: Payer: Self-pay | Admitting: Internal Medicine

## 2017-08-29 ENCOUNTER — Ambulatory Visit (INDEPENDENT_AMBULATORY_CARE_PROVIDER_SITE_OTHER): Payer: Medicare Other | Admitting: Internal Medicine

## 2017-08-29 ENCOUNTER — Telehealth: Payer: Self-pay

## 2017-08-29 VITALS — BP 126/90 | HR 80 | Temp 97.8°F | Wt 185.0 lb

## 2017-08-29 DIAGNOSIS — Z794 Long term (current) use of insulin: Secondary | ICD-10-CM

## 2017-08-29 DIAGNOSIS — M3320 Polymyositis, organ involvement unspecified: Secondary | ICD-10-CM

## 2017-08-29 DIAGNOSIS — E119 Type 2 diabetes mellitus without complications: Secondary | ICD-10-CM | POA: Diagnosis not present

## 2017-08-29 DIAGNOSIS — I10 Essential (primary) hypertension: Secondary | ICD-10-CM | POA: Diagnosis not present

## 2017-08-29 NOTE — Telephone Encounter (Signed)
Pt was seen earlier today and wants to know how often she should take the lantus; advised pt per instructions by Dr Silvio Pate to take Lantus 25 Units daily usually taken  in evening. Pt voiced understanding.

## 2017-08-29 NOTE — Assessment & Plan Note (Signed)
Reviewed yesterday's labs--- not really down Symptomatically better so can be home but still substantial weakness Dr [name] to make treatment decisions (?another option?)

## 2017-08-29 NOTE — Assessment & Plan Note (Signed)
BP Readings from Last 3 Encounters:  08/29/17 126/90  06/18/17 (!) 158/71  06/01/17 136/90   BP is fine now

## 2017-08-29 NOTE — Progress Notes (Signed)
Subjective:    Patient ID: Bridget Romero, female    DOB: 01/04/1946, 71 y.o.   MRN: [id number]  HPI Here for follow up after rehab stay Came home ~10 days ago  Muscle strength is some better Has lift chair to help her get up Able to walk with walker Aide for bath twice a week Still getting home PT/OT  Still on the prednisone Just saw Dr [name] and had blood work On imuran 150mg  and still on 40mg  of prednisone  Now on insulin---lantus and regular Low in AM and has symptoms-- and higher at other times  Current Outpatient Prescriptions on File Prior to Visit  Medication Sig Dispense Refill  . ACCU-CHEK FASTCLIX LANCETS MISC Use to test blood sugar twice daily dx: 250.02 100 each 3  . ACCU-CHEK SMARTVIEW test strip USE AS DIRECTED TO TEST  BLOOD ONCE DAILY 100 each 3  . albuterol (PROVENTIL HFA;VENTOLIN HFA) 108 (90 BASE) MCG/ACT inhaler Inhale 2 puffs into the lungs every 6 (six) hours as needed for wheezing or shortness of breath. 2 Inhaler 3  . Alcohol Swabs (B-D SINGLE USE SWABS REGULAR) PADS Use to test blood sugar once daily dx: 250.00 100 each 3  . Blood Pressure Monitoring (BLOOD PRESSURE CUFF) MISC Measure blood pressure once daily as directed. 1 each 0  . Cholecalciferol (VITAMIN D) 1000 UNITS capsule Take 1,000 Units by mouth daily.      . fluticasone (FLOVENT HFA) 110 MCG/ACT inhaler Inhale 2 puffs into the lungs daily. 12 g 3  . Insulin Syringes, Disposable, U-100 1 ML MISC 1 Syringe by Does not apply route 2 (two) times daily. 100 each 5  . lisinopril (PRINIVIL,ZESTRIL) 10 MG tablet take 1 tablet by mouth once daily 30 tablet 5  . Multiple Vitamin (MULTIVITAMIN) tablet Take 1 tablet by mouth daily.      [place] Kitchen nystatin (MYCOSTATIN) powder Apply topically 2 (two) times daily. 60 g 3  . potassium chloride SA (K-DUR,KLOR-CON) 20 MEQ tablet Take 1 tablet (20 mEq total) by mouth daily. When taking furosemide 30 tablet 3   No current facility-administered medications on file  prior to visit.     Allergies  Allergen Reactions  . Atorvastatin Other (See Comments)    myalgias  . Pravastatin Other (See Comments)    Muscle pain    Past Medical History:  Diagnosis Date  . Allergy   . Asthma   . Diabetes mellitus   . Hyperlipidemia   . Hypertension   . Osteoarthritis   . Vitamin B12 deficiency     Past Surgical History:  Procedure Laterality Date  . COLONOSCOPY WITH PROPOFOL N/A 03/07/2016   Procedure: COLONOSCOPY WITH PROPOFOL;  Surgeon: Lollie Sails, MD;  Location: [facility] ENDOSCOPY;  Service: Endoscopy;  Laterality: N/A;    Family History  Problem Relation Age of Onset  . Heart disease Mother   . Cancer Father     Social History   Social History  . Marital status: Widowed    Spouse name: N/A  . Number of children: 2  . Years of education: N/A   Occupational History  . Retired as Training and development officer at [employer]  . Smoking status: Former Research scientist (life sciences)  . Smokeless tobacco: Never Used  . Alcohol use No  . Drug use: No  . Sexual activity: Not on file   Other Topics Concern  . Not on file   Social History Narrative  No living will   Son and daughter should make health care decisions for her   Would accept resuscitation   Not sure about tube feeds   Review of Systems No SOB Appetite is improving some Sleeping pretty well Some edema--better in AM    Objective:   Physical Exam  Constitutional: She appears well-nourished. No distress.  Neck: No thyromegaly present.  Cardiovascular: Normal rate, regular rhythm and normal heart sounds.  Exam reveals no gallop.   No murmur heard. Pulmonary/Chest: Effort normal and breath sounds normal. No respiratory distress.  Musculoskeletal:  1+ calf edema  Lymphadenopathy:    She has no cervical adenopathy.          Assessment & Plan:

## 2017-08-29 NOTE — Assessment & Plan Note (Signed)
Had excellent control until the prednisone Will stop the humulin R---probably responsible for the AM hypoglycemia Increase lantus to 25 and check bid Will titrate prn

## 2017-08-29 NOTE — Patient Instructions (Signed)
Please stop the humulin R. Increase lantus to 25 units daily (usually taken in evening) Check your sugars fasting in AM and before dinner. Let me know if you are still having low sugar reactions, or if your morning sugars are consistently over 160.

## 2017-08-31 ENCOUNTER — Ambulatory Visit: Payer: Medicare Other | Admitting: Internal Medicine

## 2017-08-31 ENCOUNTER — Telehealth: Payer: Self-pay

## 2017-08-31 ENCOUNTER — Other Ambulatory Visit: Payer: Self-pay | Admitting: Internal Medicine

## 2017-08-31 DIAGNOSIS — I10 Essential (primary) hypertension: Secondary | ICD-10-CM

## 2017-08-31 NOTE — Telephone Encounter (Signed)
Pt said she is confused on how to take her diabetic meds; pt taking lantus 25 units daily and pt wants to know how she should be taking metformin(wants to know if should take metformin 1000 mg 2 in AM or one tab bid; pt thinks supposed to take at same time but med list has bid. pts FBS and BS prior to dinner meal is averaging 300. [place] request cb.

## 2017-08-31 NOTE — Telephone Encounter (Signed)
Spoke to pt. She will let us know late next week about her blood sugar readings.

## 2017-08-31 NOTE — Telephone Encounter (Signed)
The metformin should be bid If the sugars stay up, we will slowly increase the lantus

## 2017-09-06 ENCOUNTER — Other Ambulatory Visit: Payer: Self-pay

## 2017-09-06 MED ORDER — LANTUS 100 UNIT/ML ~~LOC~~ SOLN: SUBCUTANEOUS | 3 refills | Status: DC

## 2017-09-06 NOTE — Telephone Encounter (Signed)
Pt requesting refill lantus vial at [facility]. Pt is concerned that her morning BS is 50 - 60 and evening BS is 190 - 200's. Pt takes lantus 25 units in morning; in 08/29/17 office note had notation usually takes Lantus in evening. Pt request cb if should have change in lantus dosage or time pt takes lantus. Pt already has syringes so wants vials instead of pens. Please advise.

## 2017-09-06 NOTE — Telephone Encounter (Signed)
Spoke to pt. She will start doing 10 untis bid. She has syringes right now. Rx sent for Lantus

## 2017-09-06 NOTE — Telephone Encounter (Signed)
Okay to send the vials and supplies It may be best for her to cut back the lantus and split the dose---change to 10 units twice a day. We don't want those very low sugars--they can be dangerous

## 2017-09-14 ENCOUNTER — Other Ambulatory Visit: Payer: Self-pay

## 2017-09-14 MED ORDER — GLUCOSE BLOOD VI STRP: ORAL_STRIP | 1 refills | Status: DC

## 2017-09-14 NOTE — Telephone Encounter (Signed)
Pt left v/m; pt cking BS 2 - 3 times a day since on insulin. Pt request new rx to [facility] Pt notified done

## 2017-09-21 ENCOUNTER — Telehealth: Payer: Self-pay

## 2017-09-21 NOTE — Telephone Encounter (Signed)
Pt left v/m requesting verbal orders to extend [facility] OT 2 x a week for 2 weeks. Possibility to recert for [facility] OT since pt is making some progress but has room for more progress with OT.

## 2017-09-21 NOTE — Telephone Encounter (Signed)
That is okay.

## 2017-09-21 NOTE — Telephone Encounter (Signed)
Left detailed message for Marlowe Kays with verbal orders

## 2017-09-22 IMAGING — US US PELVIS COMPLETE
1 series · 14 of 25 positions shown · non-contrast
Comparison: 07/16/2012

CLINICAL DATA: Fibroid

EXAM:
TRANSABDOMINAL ULTRASOUND OF PELVIS
TECHNIQUE: Transabdominal ultrasound examination of the pelvis was performed
including evaluation of the uterus, ovaries, adnexal regions, and
pelvic cul-de-sac.

[Series 1: us pelvis complete · 0.22mm/px · 14 of 44 slices shown]
[im 1/44]
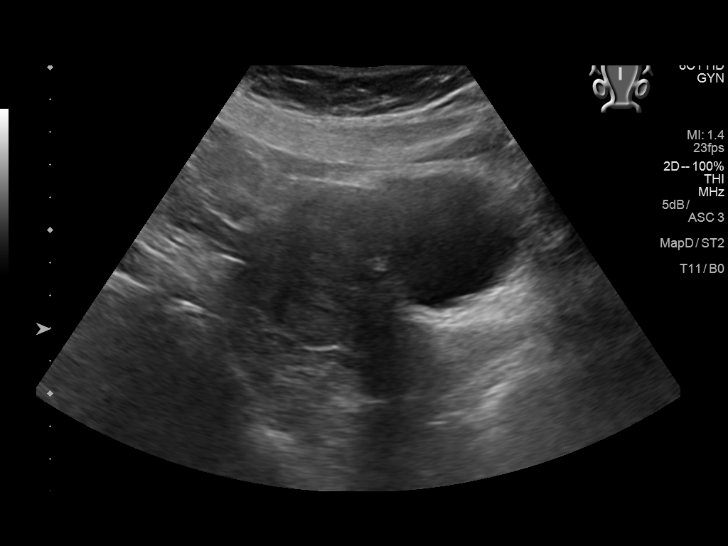
[im 4/44]
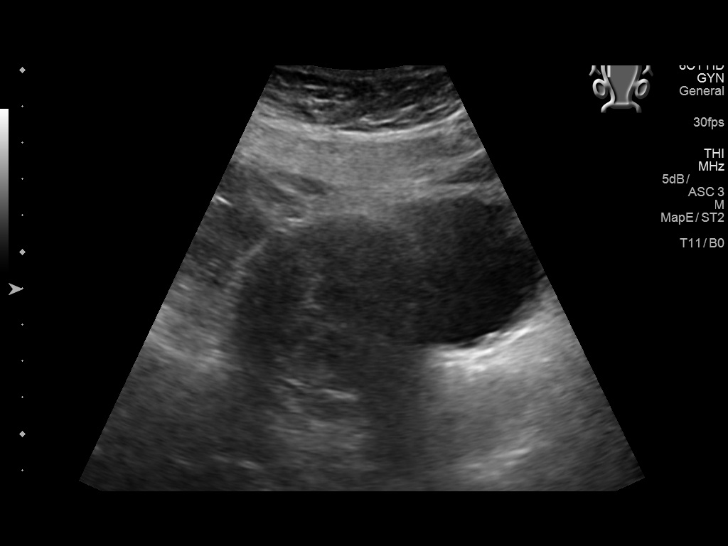
[im 8/44]
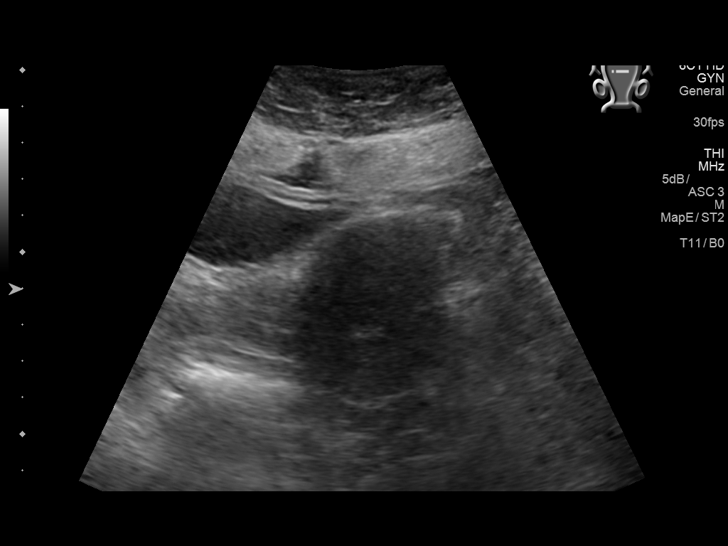
[im 11/44]
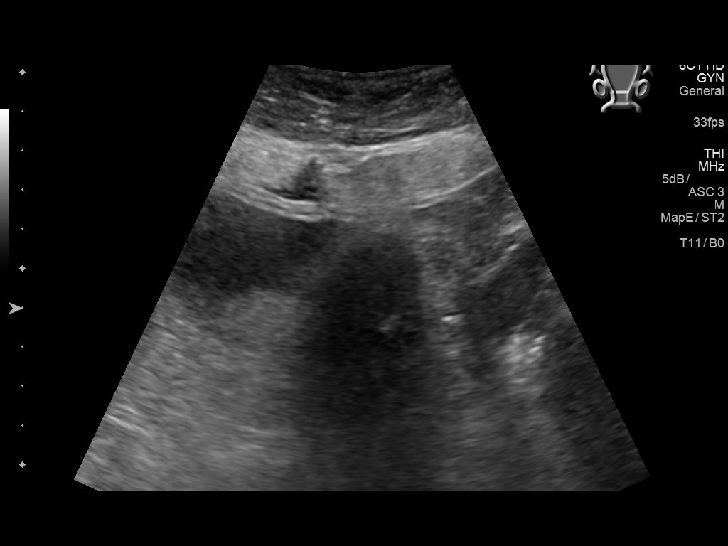
[im 15/44]
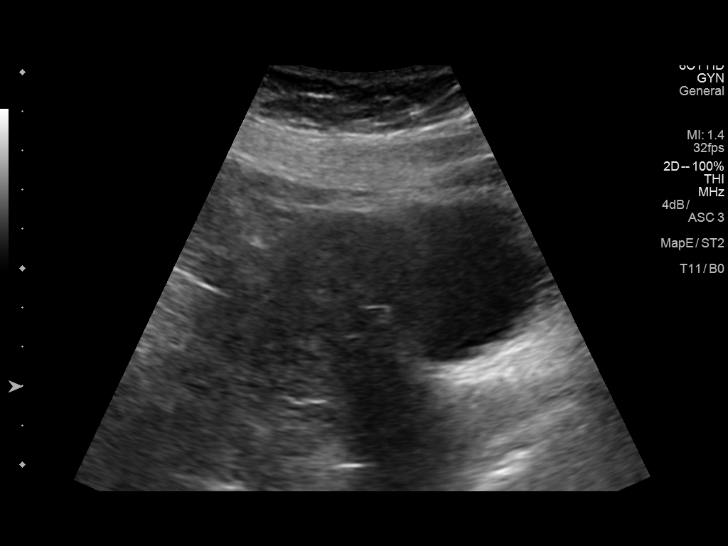
[im 17/44]
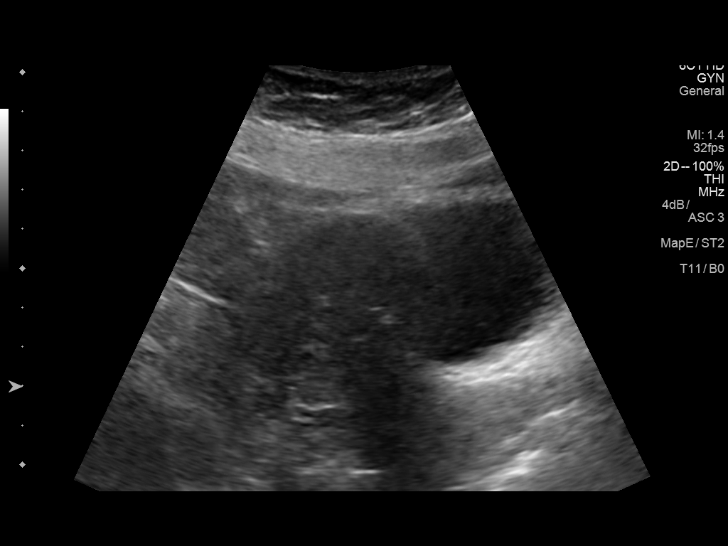
[im 20/44]
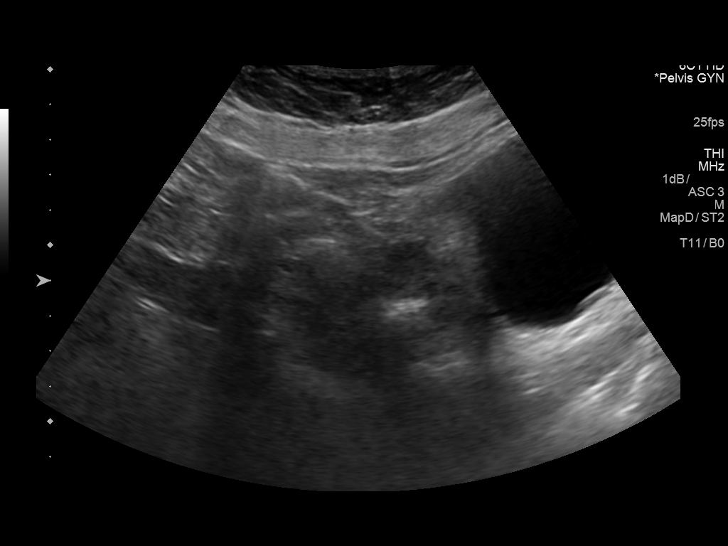
[im 24/44]
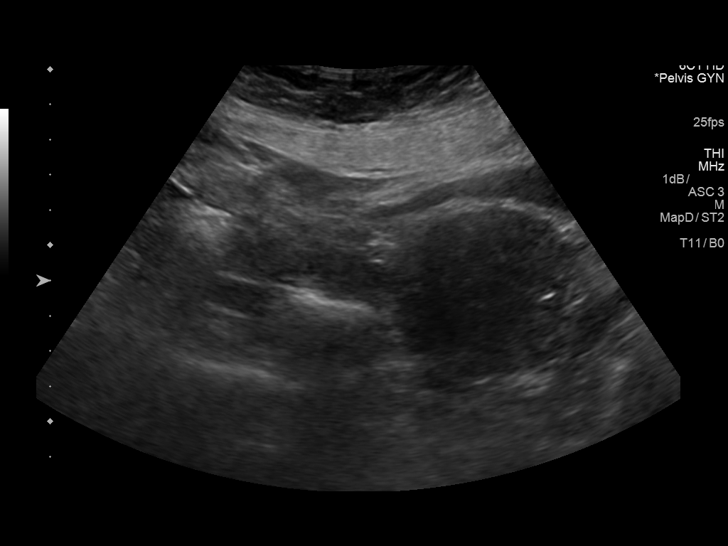
[im 27/44]
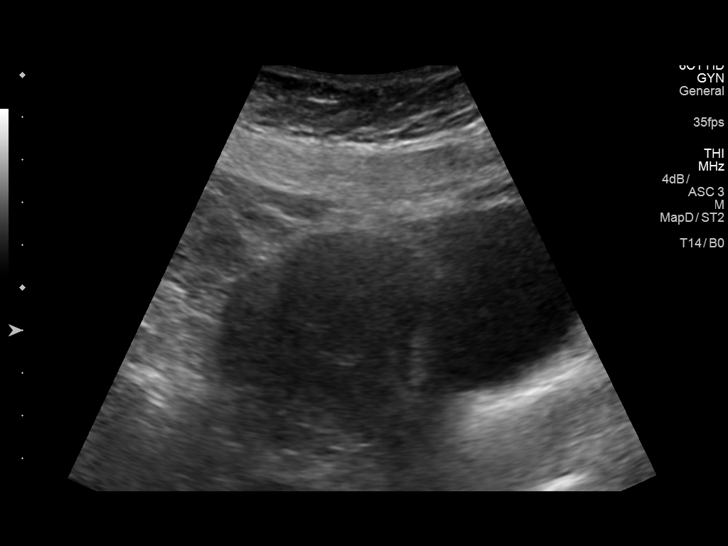
[im 29/44]
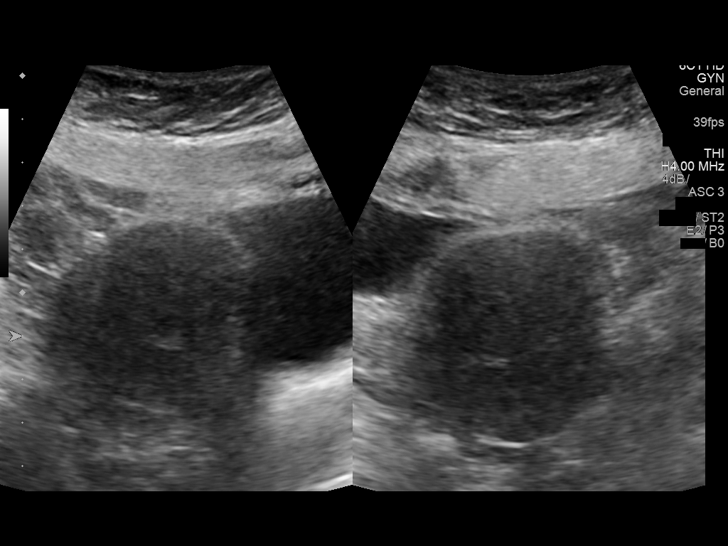
[im 33/44]
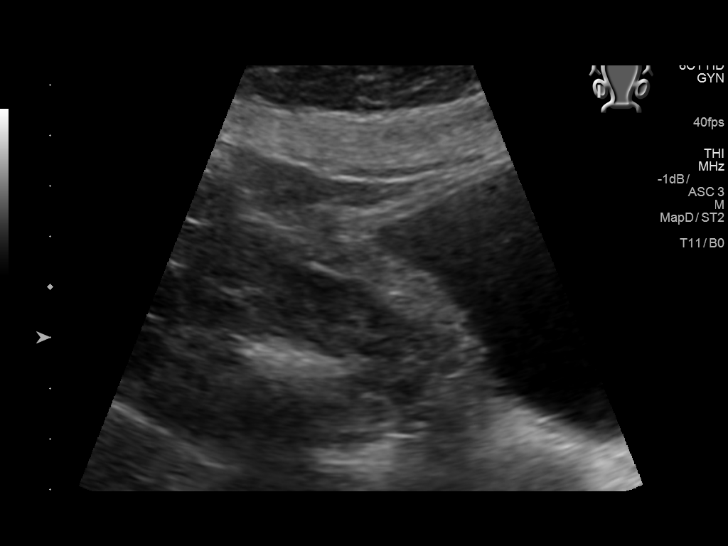
[im 36/44]
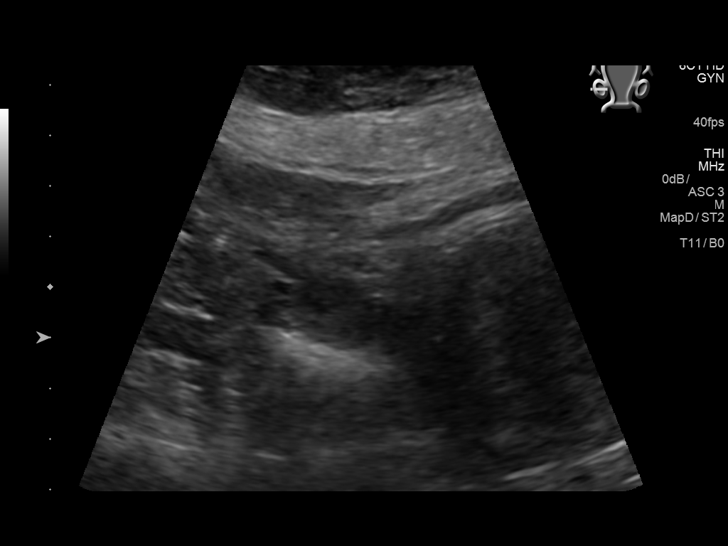
[im 40/44]
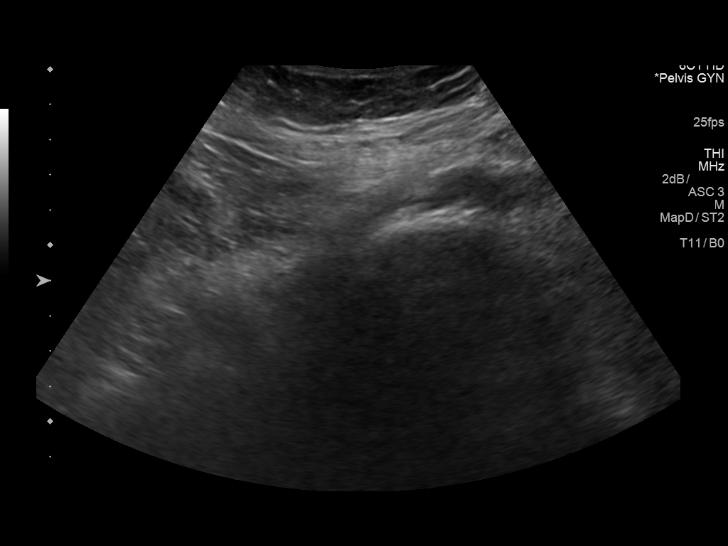
[im 44/44]
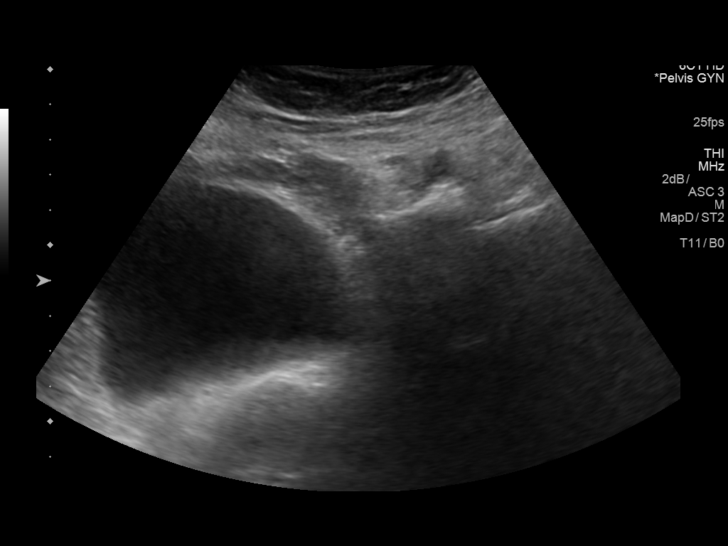

[14 of 25 positions shown; findings below may reference images not displayed]

FINDINGS: Uterus

Measurements: 6.9 x 3.5 x 4.2 cm. 4.0 x 3.8 x 3.3 cm intramural
fundal fibroid noted, compared to 3.6 cm previously maximally

Endometrium

Thickness: 6 mm in thickness.  No focal abnormality visualized.

Right ovary

Measurements: 2.6 x 1.5 x 1.8 cm. Normal appearance/no adnexal mass.

Left ovary

Measurements: Not visualized no adnexal mass seen.. Normal
appearance/no adnexal mass.

Other findings:  No abnormal free fluid.
IMPRESSION: 4 cm intramural fundal fibroid, slightly enlarged from 3.6 cm in

## 2017-10-01 ENCOUNTER — Encounter: Payer: Self-pay | Admitting: Internal Medicine

## 2017-10-01 ENCOUNTER — Ambulatory Visit (INDEPENDENT_AMBULATORY_CARE_PROVIDER_SITE_OTHER): Payer: Medicare Other | Admitting: Internal Medicine

## 2017-10-01 VITALS — BP 132/82 | HR 78 | Temp 98.1°F | Wt 178.0 lb

## 2017-10-01 DIAGNOSIS — E119 Type 2 diabetes mellitus without complications: Secondary | ICD-10-CM

## 2017-10-01 DIAGNOSIS — M3320 Polymyositis, organ involvement unspecified: Secondary | ICD-10-CM

## 2017-10-01 DIAGNOSIS — Z794 Long term (current) use of insulin: Secondary | ICD-10-CM

## 2017-10-01 LAB — HEMOGLOBIN A1C: HEMOGLOBIN A1C: 8.3 % — AB (ref 4.6–6.5)

## 2017-10-01 MED ORDER — INSULIN GLARGINE 100 UNIT/ML ~~LOC~~ SOLN: 20.0000 [IU] | Freq: Every day | SUBCUTANEOUS | 0 refills | Status: DC

## 2017-10-01 NOTE — Progress Notes (Signed)
Subjective:    Patient ID: Bridget Romero, female    DOB: Jul 09, 1946, 71 y.o.   MRN: [id number]  HPI Here for follow up of polymyositis and diabetes  Has been making some progress with her strength Getting home PT and OT Still has aide twice a week to help with bath Daughter goes with her to shop Walks with rolling walker at home Able to cook and does all instrumental ADLs Able to walk to mailbox with walker  Sugars still fine in AM--- usually 80-100 Noon usually 200s, evening also in 200's Has only been taking the metformin in afternoon (lunch) Did cut back the lantus to 10 bid due to hypoglycemia  Current Outpatient Prescriptions on File Prior to Visit  Medication Sig Dispense Refill  . ACCU-CHEK FASTCLIX LANCETS MISC Use to test blood sugar twice daily dx: 250.02 100 each 3  . albuterol (PROVENTIL HFA;VENTOLIN HFA) 108 (90 BASE) MCG/ACT inhaler Inhale 2 puffs into the lungs every 6 (six) hours as needed for wheezing or shortness of breath. 2 Inhaler 3  . Alcohol Swabs (B-D SINGLE USE SWABS REGULAR) PADS Use to test blood sugar once daily dx: 250.00 100 each 3  . alendronate (FOSAMAX) 70 MG tablet take 1 tablet by mouth every 7 days WITH A FULL GLASS OF WATER; DO NOT LIE DOWN FOR THE NEXT 30 MINUTES 4 tablet 11  . azaTHIOprine (IMURAN) 50 MG tablet take 3 tablets by mouth once daily (Patient taking differently: take 3.5 tablets by mouth once daily) 90 tablet 3  . Blood Pressure Monitoring (BLOOD PRESSURE CUFF) MISC Measure blood pressure once daily as directed. 1 each 0  . Cholecalciferol (VITAMIN D) 1000 UNITS capsule Take 1,000 Units by mouth daily.      . fluticasone (FLOVENT HFA) 110 MCG/ACT inhaler Inhale 2 puffs into the lungs daily. 12 g 3  . glucose blood (ACCU-CHEK SMARTVIEW) test strip Check blood sugar twice a day and as directed Dx E11.9 200 each 1  . Insulin Syringes, Disposable, U-100 1 ML MISC 1 Syringe by Does not apply route 2 (two) times daily. 100 each 5  .  LANTUS 100 UNIT/ML injection Inject 10 units in am and 10 units in pm 10 mL 3  . lisinopril (PRINIVIL,ZESTRIL) 10 MG tablet take 1 tablet by mouth once daily 30 tablet 11  . metFORMIN (GLUCOPHAGE) 1000 MG tablet Take 1,000 mg by mouth 2 (two) times daily.  0  . Multiple Vitamin (MULTIVITAMIN) tablet Take 1 tablet by mouth daily.      Marland Kitchen nystatin (MYCOSTATIN) powder Apply topically 2 (two) times daily. 60 g 3  . predniSONE (DELTASONE) 20 MG tablet take 2 tablets by mouth once daily 60 tablet 5  . tiZANidine (ZANAFLEX) 4 MG tablet Take 1 tablet by mouth at bedtime as needed.  0   No current facility-administered medications on file prior to visit.     Allergies  Allergen Reactions  . Atorvastatin Other (See Comments)    myalgias  . Pravastatin Other (See Comments)    Muscle pain    Past Medical History:  Diagnosis Date  . Allergy   . Asthma   . Diabetes mellitus   . Hyperlipidemia   . Hypertension   . Osteoarthritis   . Vitamin B12 deficiency     Past Surgical History:  Procedure Laterality Date  . COLONOSCOPY WITH PROPOFOL N/A 03/07/2016   Procedure: COLONOSCOPY WITH PROPOFOL;  Surgeon: Lollie Sails, MD;  Location: [facility] ENDOSCOPY;  Service: Endoscopy;  Laterality: N/A;    Family History  Problem Relation Age of Onset  . Heart disease Mother   . Cancer Father     Social History   Social History  . Marital status: Widowed    Spouse name: N/A  . Number of children: 2  . Years of education: N/A   Occupational History  . Retired as Training and development officer at [employer]  . Smoking status: Former Research scientist (life sciences)  . Smokeless tobacco: Never Used  . Alcohol use No  . Drug use: No  . Sexual activity: Not on file   Other Topics Concern  . Not on file   Social History Narrative   No living will   Son and daughter should make health care decisions for her   Would accept resuscitation   Not sure about tube feeds   Review of  Systems Some facial swelling for a while--seems to be prednisone related No problems with teeth Appetite isn't great--somewhat better    Objective:   Physical Exam  Constitutional: She appears well-nourished. No distress.  Neck: No thyromegaly present.  Cardiovascular: Normal rate, regular rhythm and normal heart sounds.  Exam reveals no gallop.   No murmur heard. Pulmonary/Chest: Effort normal and breath sounds normal. No respiratory distress. She has no wheezes. She has no rales.  Musculoskeletal:  Trace edema  Lymphadenopathy:    She has no cervical adenopathy.          Assessment & Plan:

## 2017-10-01 NOTE — Assessment & Plan Note (Signed)
About the same Fairly functionally independent other than driving and baths but still limited Will see Dr [name] next week--hopefully will be able to wean the prednisone

## 2017-10-01 NOTE — Patient Instructions (Signed)
Please increase the metformin to 1000mg  twice a day. Take with breakfast and the second dose with lunch or supper. Change to lantus to 20 units in the morning

## 2017-10-01 NOTE — Assessment & Plan Note (Signed)
Still labile with the prednisone Will continue the lantus but change to dailly Needs to increase metformin to bid She will continue to monitor

## 2017-10-05 ENCOUNTER — Telehealth: Payer: Self-pay

## 2017-10-05 NOTE — Telephone Encounter (Signed)
Marlowe Kays OT wihit [facility] left v/m requesting verbal orders for [facility] OT 2 x a week for 6 more weeks and recertify [facility] aide 2 x a week for 6 more weeks.

## 2017-10-07 NOTE — Telephone Encounter (Signed)
That is fine 

## 2017-10-08 ENCOUNTER — Telehealth: Payer: Self-pay

## 2017-10-08 NOTE — Telephone Encounter (Signed)
Pt left v/m; pt FBS have been OK but the sugars later in the day go up 200- 300. Pt wants to know what to do to regulate BS after eating. Pt request cb. Pt last seen 10/01/17.

## 2017-10-08 NOTE — Telephone Encounter (Signed)
Tried to call pt . No answer.  

## 2017-10-08 NOTE — Telephone Encounter (Signed)
That is appropriate 

## 2017-10-08 NOTE — Telephone Encounter (Signed)
Verbal orders given to Connie. 

## 2017-10-08 NOTE — Telephone Encounter (Signed)
[facility] left v/m requesting verbal orders to continue [facility] PT recertification and HH PT 2 x a week for 8 weeks for safety, balance and fall prevention.

## 2017-10-08 NOTE — Telephone Encounter (Signed)
Left verbal orders on VM for [facility]

## 2017-10-08 NOTE — Telephone Encounter (Signed)
I don't think she should be checking other than fasting in the morning. I would increase her medications if the fasting sugars are regularly over 200

## 2017-10-09 NOTE — Telephone Encounter (Signed)
Spoke to pt. She will start checking just fasting blood sugar.

## 2017-10-16 ENCOUNTER — Telehealth: Payer: Self-pay | Admitting: Internal Medicine

## 2017-10-16 MED ORDER — METFORMIN HCL 1000 MG PO TABS: 1000.0000 mg | ORAL_TABLET | Freq: Two times a day (BID) | ORAL | 11 refills | Status: DC

## 2017-10-16 NOTE — Telephone Encounter (Signed)
Rx sent electronically.  

## 2017-10-16 NOTE — Telephone Encounter (Signed)
Copied from [place] #[id number]. Topic: General - Other >> Oct 16, 2017 11:01 AM Conception Chancy, NT wrote: Reason for CRM: pt needs dr to send her [facility] medicine into the pharmacy.

## 2017-10-16 NOTE — Telephone Encounter (Signed)
Please review for refill- patient is requesting Metformin.

## 2017-10-18 ENCOUNTER — Other Ambulatory Visit: Payer: Self-pay | Admitting: Internal Medicine

## 2017-10-25 DIAGNOSIS — Z7952 Long term (current) use of systemic steroids: Secondary | ICD-10-CM | POA: Diagnosis not present

## 2017-10-25 DIAGNOSIS — M3321 Polymyositis with respiratory involvement: Secondary | ICD-10-CM | POA: Diagnosis not present

## 2017-10-25 DIAGNOSIS — I10 Essential (primary) hypertension: Secondary | ICD-10-CM | POA: Diagnosis not present

## 2017-10-25 DIAGNOSIS — E119 Type 2 diabetes mellitus without complications: Secondary | ICD-10-CM | POA: Diagnosis not present

## 2017-10-25 DIAGNOSIS — Z794 Long term (current) use of insulin: Secondary | ICD-10-CM | POA: Diagnosis not present

## 2017-10-25 DIAGNOSIS — Z9181 History of falling: Secondary | ICD-10-CM

## 2017-10-25 DIAGNOSIS — J45909 Unspecified asthma, uncomplicated: Secondary | ICD-10-CM | POA: Diagnosis not present

## 2017-10-25 DIAGNOSIS — Z86010 Personal history of colonic polyps: Secondary | ICD-10-CM | POA: Diagnosis not present

## 2017-10-25 DIAGNOSIS — Z741 Need for assistance with personal care: Secondary | ICD-10-CM | POA: Diagnosis not present

## 2017-11-01 ENCOUNTER — Telehealth: Payer: Self-pay | Admitting: *Deleted

## 2017-11-01 ENCOUNTER — Telehealth: Payer: Self-pay | Admitting: Internal Medicine

## 2017-11-01 NOTE — Telephone Encounter (Signed)
Copied from [place] [phone]. Topic: Quick Communication - See Telephone Encounter >> Nov 01, 2017 10:09 AM Bea Graff, NT wrote: CRM for notification. See Telephone encounter for: Patient is needing to speak with Dr. Silvio Pate nurse about getting her blood sugar regulate. In the morning it is 90-100, but goes up by the evening to the 300s. Patient would like to know what she can do to keep is stable and not go up.  11/01/17.

## 2017-11-01 NOTE — Telephone Encounter (Signed)
Make sure she is not checking it within 2 hours after eating supper. While she is on the prednisone, the goal is to prevent serious high sugars---and the good ones in the morning are very reassuring. We might consider splitting her lantus in half and taking bid---but I am not sure we need to change anything right now.

## 2017-11-01 NOTE — Telephone Encounter (Signed)
Have her split the lantus then--same daily dose in 2 doses. I am concerned about increasing with the fairly low AM sugars. Have her keep [place] updated

## 2017-11-01 NOTE — Telephone Encounter (Signed)
Spoke to pt. She said she is fasting in the mornings. Bridget Romero it was really good then started going up the last few days. She said she does not snack between lunch and dinner and usually does not drink anything with sugar. Said she was checking her blood sugar before she eats. She liked the idea of splitting the Lantus.

## 2017-11-01 NOTE — Telephone Encounter (Signed)
Copied from [place] [id number]. Topic: Quick Communication - See Telephone Encounter >> Nov 01, 2017 10:09 AM Bea Graff, NT wrote: CRM for notification. See Telephone encounter for: Patient is needing to speak with Dr. Silvio Pate nurse about getting her blood sugar regulate. In the morning it is 90-100, but goes up by the evening to the 300s. Patient would like to know what she can do to keep is stable and not go up.  11/01/17.

## 2017-11-02 NOTE — Telephone Encounter (Signed)
Spoke to pt. She will take 10 units in the morning and 10 units in the evening. She will keep [place] updated. She will continue taking the metformin, also.

## 2017-11-07 NOTE — Telephone Encounter (Signed)
Pt called and notified to call the pharmacy for requested refill of insulin syringe.

## 2017-11-07 NOTE — Telephone Encounter (Signed)
Copied from [place] [phone] [phone]. Topic: Quick Communication - See Telephone Encounter >> Nov 07, 2017  3:08 PM Vernona Rieger wrote: CRM for notification. See Telephone encounter for:   Patient needs a refill on her INSULIN SYRINGES and sent over to [facility] on [address] 11/07/17.

## 2017-11-29 ENCOUNTER — Encounter: Payer: Self-pay | Admitting: Oncology

## 2017-11-29 ENCOUNTER — Inpatient Hospital Stay: Payer: Medicare Other | Attending: Oncology | Admitting: Oncology

## 2017-11-29 ENCOUNTER — Encounter (INDEPENDENT_AMBULATORY_CARE_PROVIDER_SITE_OTHER): Payer: Self-pay

## 2017-11-29 ENCOUNTER — Other Ambulatory Visit: Payer: Self-pay

## 2017-11-29 VITALS — BP 153/75 | HR 85 | Temp 98.1°F | Resp 18 | Wt 170.4 lb

## 2017-11-29 DIAGNOSIS — M3322 Polymyositis with myopathy: Secondary | ICD-10-CM | POA: Insufficient documentation

## 2017-11-29 DIAGNOSIS — E119 Type 2 diabetes mellitus without complications: Secondary | ICD-10-CM | POA: Diagnosis not present

## 2017-11-29 DIAGNOSIS — Z87891 Personal history of nicotine dependence: Secondary | ICD-10-CM | POA: Diagnosis not present

## 2017-11-29 DIAGNOSIS — Z794 Long term (current) use of insulin: Secondary | ICD-10-CM | POA: Diagnosis not present

## 2017-11-29 DIAGNOSIS — M60829 Other myositis, unspecified upper arm: Secondary | ICD-10-CM

## 2017-11-29 DIAGNOSIS — E785 Hyperlipidemia, unspecified: Secondary | ICD-10-CM | POA: Insufficient documentation

## 2017-11-29 DIAGNOSIS — I10 Essential (primary) hypertension: Secondary | ICD-10-CM | POA: Insufficient documentation

## 2017-11-29 DIAGNOSIS — R5383 Other fatigue: Secondary | ICD-10-CM | POA: Diagnosis not present

## 2017-11-29 DIAGNOSIS — R531 Weakness: Secondary | ICD-10-CM | POA: Insufficient documentation

## 2017-11-29 DIAGNOSIS — R5381 Other malaise: Secondary | ICD-10-CM | POA: Diagnosis not present

## 2017-11-29 DIAGNOSIS — Z7952 Long term (current) use of systemic steroids: Secondary | ICD-10-CM | POA: Insufficient documentation

## 2017-11-29 DIAGNOSIS — M60822 Other myositis, left upper arm: Secondary | ICD-10-CM | POA: Insufficient documentation

## 2017-11-29 DIAGNOSIS — G7281 Critical illness myopathy: Secondary | ICD-10-CM | POA: Diagnosis not present

## 2017-11-29 DIAGNOSIS — M1990 Unspecified osteoarthritis, unspecified site: Secondary | ICD-10-CM | POA: Insufficient documentation

## 2017-11-29 DIAGNOSIS — Z79899 Other long term (current) drug therapy: Secondary | ICD-10-CM | POA: Diagnosis not present

## 2017-11-29 NOTE — Progress Notes (Addendum)
Hematology/Oncology Consult note [facility] Telephone:([phone] [phone]  Patient Care Team: [name], MD as PCP - General   Name of the patient: Bridget Romero  [id number]  1946-01-13    Reason for referral-history of polymyositis for IVIG treatment   Referring physician-Dr. 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  She is currently on Imuran for the same.  Her past medical history is also significant for diabetes for which insulin and metformin and hypertension.  She is also currently on steroids for her myopathy and is currently on 20 mg.  Patient had biopsy of the left deltoid in June 2018 which showed immune mediated inflammatory myopathy.  Plan was to proceed with IVIG if her CK kept going and her recent CT was noted to be 2743 on 11/08/2017 higher than 2405 in October 2018.  Patient had a CT abdomen done in June 2018 which did not reveal any acute findings in chest abdomen or pelvis.  Recent blood work from 11/08/2017 showed white count of 7.7, H&H of 10.3/32.6 with an MCV of 99.1 and a platelet count of 332.  AST and ALT were elevated at 81 4 respectively.   Patient reports that she has weakness in her proximal thigh muscles and has problems standing up from sitting position.  She is able to perform some household chores.  ECOG PS- 1  Pain scale- 0   Review of systems- Review of Systems  Constitutional: Positive for malaise/fatigue. Negative for chills, fever and weight loss.  HENT: Negative for congestion, ear discharge and nosebleeds.   Eyes: Negative for blurred vision.  Respiratory: Negative for cough, hemoptysis, sputum production, shortness of breath and wheezing.   Cardiovascular: Negative for chest pain, palpitations, orthopnea and claudication.  Gastrointestinal: Negative for abdominal pain, blood  in stool, constipation, diarrhea, heartburn, melena, nausea and vomiting.  Genitourinary: Negative for dysuria, flank pain, frequency, hematuria and urgency.  Musculoskeletal: Negative for back pain, joint pain and myalgias.       Weakness in arms and legs  Skin: Negative for rash.  Neurological: Positive for weakness. Negative for dizziness, tingling, focal weakness, seizures and headaches.  Endo/Heme/Allergies: Does not bruise/bleed easily.  Psychiatric/Behavioral: Negative for depression and suicidal ideas. The patient does not have insomnia.     Allergies  Allergen Reactions  . Atorvastatin Other (See Comments)    myalgias  . Pravastatin Other (See Comments)    Muscle pain    Patient Active Problem List   Diagnosis Date Noted  . Polymyositis, organ involvement unspecified ([place]) 07/02/2017  . Edema 06/01/2017  . Hip weakness 03/14/2017  . Vitamin B12 deficiency   . Essential hypertension 03/01/2016  . Elevated liver function tests 01/12/2016  . Right-sided low back pain without sciatica 12/22/2015  . Advanced directives, counseling/discussion 12/09/2014  . Routine general medical examination at a health care facility 09/22/2011  . Hyperlipidemia   . Sleep disturbance 09/08/2009  . NECK PAIN, CHRONIC 07/06/2009  . Osteoarthritis, multiple sites 03/04/2008  . Diabetes mellitus type 2, controlled, without complications ([place]) 41/74/0814  . ALLERGIC RHINITIS 08/22/2007  . Allergic asthma 08/22/2007     Past Medical History:  Diagnosis Date  . Allergy   . Asthma   . Diabetes mellitus   . Hyperlipidemia   . Hypertension   . Osteoarthritis   . Vitamin B12 deficiency      Past Surgical  History:  Procedure Laterality Date  . COLONOSCOPY WITH PROPOFOL N/A 03/07/2016   Procedure: COLONOSCOPY WITH PROPOFOL;  Surgeon: Lollie Sails, MD;  Location: [facility] ENDOSCOPY;  Service: Endoscopy;  Laterality: N/A;    Social History   Socioeconomic History  . Marital status:  Widowed    Spouse name: Not on file  . Number of children: 2  . Years of education: Not on file  . Highest education level: Not on file  Social Needs  . Financial resource strain: Not on file  . Food insecurity - worry: Not on file  . Food insecurity - inability: Not on file  . Transportation needs - medical: Not on file  . Transportation needs - non-medical: Not on file  Occupational History  . Occupation: Retired as Training and development officer at [employer]:    Tobacco Use  . Smoking status: Former Research scientist (life sciences)  . Smokeless tobacco: Never Used  Substance and Sexual Activity  . Alcohol use: No  . Drug use: No  . Sexual activity: Not on file  Other Topics Concern  . Not on file  Social History Narrative   No living will   Son and daughter should make health care decisions for her   Would accept resuscitation   Not sure about tube feeds     Family History  Problem Relation Age of Onset  . Heart disease Mother   . Cancer Father      Current Outpatient Medications:  .  ACCU-CHEK FASTCLIX LANCETS MISC, Use to test blood sugar twice daily dx: 250.02, Disp: 100 each, Rfl: 3 .  albuterol (PROVENTIL HFA;VENTOLIN HFA) 108 (90 BASE) MCG/ACT inhaler, Inhale 2 puffs into the lungs every 6 (six) hours as needed for wheezing or shortness of breath., Disp: 2 Inhaler, Rfl: 3 .  Alcohol Swabs (B-D SINGLE USE SWABS REGULAR) PADS, Use to test blood sugar once daily dx: 250.00, Disp: 100 each, Rfl: 3 .  alendronate (FOSAMAX) 70 MG tablet, take 1 tablet by mouth every 7 days WITH A FULL GLASS OF WATER; DO NOT LIE DOWN FOR THE NEXT 30 MINUTES, Disp: 4 tablet, Rfl: 11 .  azaTHIOprine (IMURAN) 50 MG tablet, take 3 tablets by mouth once daily (Patient taking differently: take 3.5 tablets by mouth once daily), Disp: 90 tablet, Rfl: 3 .  Blood Pressure Monitoring (BLOOD PRESSURE CUFF) MISC, Measure blood pressure once daily as directed., Disp: 1 each, Rfl: 0 .  Cholecalciferol (VITAMIN D) 1000  UNITS capsule, Take 1,000 Units by mouth daily.  , Disp: , Rfl:  .  fluticasone (FLOVENT HFA) 110 MCG/ACT inhaler, Inhale 2 puffs into the lungs daily., Disp: 12 g, Rfl: 3 .  glucose blood (ACCU-CHEK SMARTVIEW) test strip, Check blood sugar twice a day and as directed Dx E11.9, Disp: 200 each, Rfl: 1 .  insulin glargine (LANTUS) 100 UNIT/ML injection, Inject 0.2 mLs (20 Units total) into the skin daily., Disp: 1 mL, Rfl: 0 .  Insulin Syringes, Disposable, U-100 1 ML MISC, 1 Syringe by Does not apply route 2 (two) times daily., Disp: 100 each, Rfl: 5 .  lisinopril (PRINIVIL,ZESTRIL) 10 MG tablet, take 1 tablet by mouth once daily, Disp: 30 tablet, Rfl: 11 .  metFORMIN (GLUCOPHAGE) 1000 MG tablet, take 1 tablet by mouth twice a day, Disp: 60 tablet, Rfl: 11 .  Multiple Vitamin (MULTIVITAMIN) tablet, Take 1 tablet by mouth daily.  , Disp: , Rfl:  .  nystatin (MYCOSTATIN) powder, Apply topically 2 (two) times daily., Disp:  60 g, Rfl: 3 .  predniSONE (DELTASONE) 20 MG tablet, take 2 tablets by mouth once daily, Disp: 60 tablet, Rfl: 5 .  tiZANidine (ZANAFLEX) 4 MG tablet, Take 1 tablet by mouth at bedtime as needed., Disp: , Rfl: 0   Physical exam:  Vitals:   11/29/17 1417  BP: (!) 153/75  Pulse: 85  Resp: 18  Temp: 98.1 F (36.7 C)  TempSrc: Tympanic  Weight: 170 lb 6.4 oz (77.3 kg)   Physical Exam  Constitutional: She is oriented to person, place, and time and well-developed, well-nourished, and in no distress.  Sitting ina  wheelchair  HENT:  Head: Normocephalic and atraumatic.  Eyes: EOM are normal. Pupils are equal, round, and reactive to light.  Neck: Normal range of motion.  Cardiovascular: Normal rate, regular rhythm and normal heart sounds.  Pulmonary/Chest: Effort normal and breath sounds normal.  Abdominal: Soft. Bowel sounds are normal.  Musculoskeletal: She exhibits edema (trace b/l).  Neurological: She is alert and oriented to person, place, and time.  Skin: Skin is warm  and dry.       CMP Latest Ref Rng & Units 01/02/2017  Glucose 70 - 99 mg/dL 100(H)  BUN 6 - 23 mg/dL 14  Creatinine 0.40 - 1.20 mg/dL 0.56  Sodium 135 - 145 mEq/L 142  Potassium 3.5 - 5.1 mEq/L 4.2  Chloride 96 - 112 mEq/L 103  CO2 19 - 32 mEq/L 31  Calcium 8.4 - 10.5 mg/dL 9.9  Total Protein 6.0 - 8.3 g/dL 7.2  Total Bilirubin 0.2 - 1.2 mg/dL 0.6  Alkaline Phos 39 - 117 U/L 55  AST 0 - 37 U/L 121(H)  ALT 0 - 35 U/L 193(H)   CBC Latest Ref Rng & Units 01/02/2017  WBC 4.0 - 10.5 K/uL 8.8  Hemoglobin 12.0 - 15.0 g/dL 11.8(L)  Hematocrit 36.0 - 46.0 % 36.1  Platelets 150.0 - 400.0 K/uL 390.0     Assessment and plan- Patient is a 71 y.o. female with history of biopsy-proven autoimmune myositis referred for consideration of IVIG therapy.   I discussed the risks and benefits of IVIG including all but not limited to hypotension, tachycardia risk of kidney and liver dysfunction, nausea vomiting, headaches.  Dr. [name] he would like me to give her a dose of 1 g/kg daily for 2 doses followed by every 4 weekly dosing depending on response.  Recent CBC from 11/08/2017 showed white count of 7.7, H&H of 10.3/32.6 and a platelet count of 332.  Her hemoglobin has been around 10 at least since June 2018.  Serum creatinine was 0.5 on 11/08/2017.  I will see her back in 4 weeks time tentatively for second dose of IVIG based on further recommendations from Dr. [name].  I will be over seeing her infusion at our center.  If she were to have any side effects from the treatment she needs to get in touch with Dr. [name].   Thank you for this kind referral and the opportunity to participate in the care of this patient   Visit Diagnosis 1. Other myositis of upper extremity, unspecified laterality     Dr. Randa Evens, MD, MPH [facility] Pager- [phone] 11/29/2017  3:36 PM

## 2017-11-29 NOTE — Progress Notes (Signed)
Patient is referred by rheumatology to discuss starting IV medication for polymyositis with myopathy at this office.

## 2017-11-30 NOTE — Addendum Note (Signed)
Addended by: Randa Evens C on: 11/30/2017 08:09 AM   Modules accepted: Orders

## 2017-12-03 ENCOUNTER — Ambulatory Visit: Payer: Medicare Other

## 2017-12-04 ENCOUNTER — Ambulatory Visit: Payer: Medicare Other

## 2017-12-04 ENCOUNTER — Telehealth: Payer: Self-pay | Admitting: *Deleted

## 2017-12-04 ENCOUNTER — Ambulatory Visit: Payer: Self-pay | Admitting: *Deleted

## 2017-12-04 NOTE — Telephone Encounter (Signed)
Pt  Has  Been taking  Prednisone  For  At least  5 months   Blood  Sugar  Has  Been  Elevated       She  Has  Been taking  meds      And  Checking  Glucose  Regular  She    Is  Concerned   About  Symptoms   Reason for Disposition . [1] Symptoms of high blood sugar (e.g., frequent urination, weak, weight loss) AND [2] not able to test blood glucose  Answer Assessment - Initial Assessment Questions 1. BLOOD GLUCOSE: "What is your blood glucose level?"       279 2. ONSET: "When did you check the blood glucose?"       3.5  Hours   3. USUAL RANGE: "What is your glucose level usually?" (e.g., usual fasting morning value, usual evening value)        Going   Up  Since   Starting prednisone    Mid  200   Usually in  Am   4. KETONES: "Do you check for ketones (urine or blood test strips)?" If yes, ask: "What does the test show now?"          No 5. TYPE 1 or 2:  "Do you know what type of diabetes you have?"  (e.g., Type 1, Type 2, Gestational; doesn't know)         Type  2   6. INSULIN: "Do you take insulin?" If yes, ask: "Have you missed any shots recently?"        Yes    lantus  10 u bid      7. DIABETES PILLS: "Do you take any pills for your diabetes?" If yes, ask: "Have you missed taking any pills recently?"       Metformin as   Well   8. OTHER SYMPTOMS: "Do you have any symptoms?" (e.g., fever, frequent urination, difficulty breathing, dizziness, weakness, vomiting)       No   9. PREGNANCY: "Is there any chance you are pregnant?" "When was your last menstrual period?"     NO    -   POST  menapausal  Protocols used: DIABETES - HIGH BLOOD SUGAR-A-AH

## 2017-12-04 NOTE — Telephone Encounter (Signed)
Sharyn Lull called and wanted to r/s pt.  She was to the understanding that she only had to come one time.  I told her that is what I read but when Dr. Janese Banks read it the instructions says to repeat steps 1-4 and the 4th step is IVIG making it a 2 days in a row infusion.  Sharyn Lull read over it and feels the same way.  They do not have room on the schedule for tues. Or wed. And next week no room tues.  She would like to give it Friday of this week and then Monday afterward for 2nd dose. I called Dr. Lindon Romp office and asked if the 2 doses can be given on a Friday and then the following Monday and he said yes.  I called Sharyn Lull back and let her know it is ok to do it that way.  Also pt has appt with [facility] 12/27 and he will evaluate if pt needs another dose. In 4 weeks from her last one

## 2017-12-06 ENCOUNTER — Encounter: Payer: Self-pay | Admitting: Family Medicine

## 2017-12-06 ENCOUNTER — Ambulatory Visit: Payer: Medicare Other | Admitting: Family Medicine

## 2017-12-06 DIAGNOSIS — Z794 Long term (current) use of insulin: Secondary | ICD-10-CM | POA: Diagnosis not present

## 2017-12-06 DIAGNOSIS — E119 Type 2 diabetes mellitus without complications: Secondary | ICD-10-CM

## 2017-12-06 MED ORDER — PREDNISONE 20 MG PO TABS: ORAL_TABLET | ORAL | Status: DC

## 2017-12-06 MED ORDER — AZATHIOPRINE 50 MG PO TABS: ORAL_TABLET | ORAL | Status: DC

## 2017-12-06 MED ORDER — INSULIN GLARGINE 100 UNIT/ML ~~LOC~~ SOLN: SUBCUTANEOUS | Status: DC

## 2017-12-06 MED ORDER — INSULIN GLARGINE 100 UNIT/ML ~~LOC~~ SOLN: 10.0000 [IU] | Freq: Two times a day (BID) | SUBCUTANEOUS | Status: DC

## 2017-12-06 NOTE — Progress Notes (Signed)
DM2 f/u. Marland Kitchen  Has been taking 10 units of lantus BID with metformin.  Med list updated.  She had lower sugar in the AM and higher doses later in the day, so her insulin was changed from 20 QD to 10 BID.  Her sugar is still lower in the AM and still higher later in the day.  AM 130-200, PM up to 300s.   She is on lower dose of prednisone for the last few months, on a stable dose.   Her diet is steady overall but she doesn't have a lot of appetite at baseline.    Meds, vitals, and allergies reviewed.   ROS: Per HPI unless specifically indicated in ROS section   nad In wheelchair ncat Mmm Neck supple, no LA rrr ctab abd soft Ext w/o edema.

## 2017-12-06 NOTE — Patient Instructions (Signed)
Keep taking 10 units at night Increase your AM dose of insulin.   Day AM dose  PM dose 14th 11  10 15th 12  10 16th 13  10 17th 14  10 18th  15  10 19th 16  10  Update [place] with your sugar and dose next week.  If you have low sugars, then cut your dose back by 1 unit.  Take care.  Glad to see you.

## 2017-12-07 ENCOUNTER — Inpatient Hospital Stay: Payer: Medicare Other

## 2017-12-07 ENCOUNTER — Ambulatory Visit: Payer: Medicare Other

## 2017-12-07 VITALS — BP 155/76 | HR 83 | Temp 98.5°F | Resp 18

## 2017-12-07 DIAGNOSIS — M3320 Polymyositis, organ involvement unspecified: Secondary | ICD-10-CM

## 2017-12-07 DIAGNOSIS — M3322 Polymyositis with myopathy: Secondary | ICD-10-CM | POA: Diagnosis not present

## 2017-12-07 MED ORDER — IMMUNE GLOBULIN (HUMAN) 5 GM/50ML IV SOLN
1.0000 g/kg | INTRAVENOUS | Status: DC
  Administered 2017-12-07: 10:00:00 75 g via INTRAVENOUS
  Filled 2017-12-07: qty 50

## 2017-12-07 MED ORDER — ACETAMINOPHEN 325 MG PO TABS
650.0000 mg | ORAL_TABLET | Freq: Every day | ORAL | Status: DC
  Administered 2017-12-07: 650 mg via ORAL

## 2017-12-07 MED ORDER — SODIUM CHLORIDE 0.9 % IV SOLN
Freq: Once | INTRAVENOUS | Status: AC
  Administered 2017-12-07: 10:00:00 via INTRAVENOUS
  Filled 2017-12-07: qty 1000

## 2017-12-07 MED ORDER — ACETAMINOPHEN 325 MG PO TABS
ORAL_TABLET | ORAL | Status: AC
  Filled 2017-12-07: qty 2

## 2017-12-07 MED ORDER — DIPHENHYDRAMINE HCL 25 MG PO TABS
25.0000 mg | ORAL_TABLET | Freq: Every day | ORAL | Status: DC
  Administered 2017-12-07: 25 mg via ORAL
  Filled 2017-12-07: qty 1

## 2017-12-07 MED ORDER — DIPHENHYDRAMINE HCL 25 MG PO CAPS
ORAL_CAPSULE | ORAL | Status: AC
  Filled 2017-12-07: qty 1

## 2017-12-07 NOTE — Assessment & Plan Note (Signed)
Likely best option is to slowly increase her morning dose of insulin. Keep taking 10 units at night  Day AM dose  PM dose 14th 11  10 15th 12  10 16th 13  10 17th 14  10 18th  15  10 19th 16  10  She'll update [place] with her sugar and dose next week.  If any low sugars, then cut your dose back by 1 unit.  D/w pt.  She agrees.

## 2017-12-10 ENCOUNTER — Inpatient Hospital Stay: Payer: Medicare Other

## 2017-12-10 ENCOUNTER — Ambulatory Visit: Payer: Medicare Other

## 2017-12-10 VITALS — BP 163/75 | HR 76 | Temp 98.1°F | Resp 18

## 2017-12-10 DIAGNOSIS — M3322 Polymyositis with myopathy: Secondary | ICD-10-CM | POA: Diagnosis not present

## 2017-12-10 DIAGNOSIS — M3320 Polymyositis, organ involvement unspecified: Secondary | ICD-10-CM

## 2017-12-10 MED ORDER — SODIUM CHLORIDE 0.9 % IV SOLN
Freq: Once | INTRAVENOUS | Status: AC
  Administered 2017-12-10: 09:00:00 via INTRAVENOUS
  Filled 2017-12-10: qty 1000

## 2017-12-10 MED ORDER — IMMUNE GLOBULIN (HUMAN) 5 GM/50ML IV SOLN
1.0000 g/kg | INTRAVENOUS | Status: DC
  Administered 2017-12-10: 75 g via INTRAVENOUS
  Filled 2017-12-10: qty 50

## 2017-12-12 ENCOUNTER — Telehealth: Payer: Self-pay | Admitting: Internal Medicine

## 2017-12-12 NOTE — Telephone Encounter (Signed)
Copied from [place] [id number]. Topic: Quick Communication - See Telephone Encounter >> Dec 12, 2017  3:14 PM Percell Belt A wrote: CRM for notification. See Telephone encounter for: pt called in said that she was seen last week.  She was told to call back in today let Dr know if sugar keeps going up and it is.  Last reading was 260 plus  She would like a call back from Dr Silvio Pate or Damita Dunnings nurse   Best number [phone]  12/12/17.

## 2017-12-12 NOTE — Telephone Encounter (Signed)
Unable to reach pt by phone; no answer and no v/m. 

## 2017-12-13 NOTE — Telephone Encounter (Signed)
She was still taking 10 units at night but increasing her AM dose.  See prev AVS.  She can continue adding 1 unit in the AM per day to help with sugar elevations. Thanks.

## 2017-12-13 NOTE — Telephone Encounter (Signed)
Patient advised. Patient says she wants to talk to Dr. Silvio Pate because this plan is not working and she wants to go back to Dr. Alla German instructions.

## 2017-12-13 NOTE — Telephone Encounter (Signed)
Discussed with her Prednisone decreased to 10 mg daily  Still on metformin 1000 bid  Now on 15 units insulin bid Sugar 65 this Am, 140 before lunch It has been about a week since it was over 200  Discussed that our goal is to be under 200 Should be better with the decreased prednisone She will let me know if she is persistently higher

## 2017-12-15 ENCOUNTER — Other Ambulatory Visit: Payer: Self-pay | Admitting: Internal Medicine

## 2017-12-17 ENCOUNTER — Telehealth: Payer: Self-pay | Admitting: Internal Medicine

## 2017-12-17 ENCOUNTER — Other Ambulatory Visit: Payer: Self-pay | Admitting: Internal Medicine

## 2017-12-17 MED ORDER — AZATHIOPRINE 50 MG PO TABS: ORAL_TABLET | ORAL | 0 refills | Status: DC

## 2017-12-17 NOTE — Telephone Encounter (Signed)
Spoke to pt. Rx sent electronically.  

## 2017-12-17 NOTE — Telephone Encounter (Signed)
Copied from [place] [id number]. Topic: Quick Communication - Rx Refill/Question >> Dec 17, 2017  9:27 AM Arletha Grippe wrote: Has the patient contacted their pharmacy? Yes.     (Agent: If no, request that the patient contact the pharmacy for the refill.)   Preferred Pharmacy (with phone number or street name): [facility] [address] in [place] azaTHIOprine (IMURAN) 50 MG tablet. Pt will.be out on Tuesday.  [phone]. Pt notified of 48-72 hrs    Agent: Please be advised that RX refills may take up to 3 business days. We ask that you follow-up with your pharmacy.

## 2017-12-17 NOTE — Telephone Encounter (Signed)
She said that Dr Alla German name is on the last refill.

## 2017-12-17 NOTE — Telephone Encounter (Signed)
Okay to fill for 1 month x 1 but she needs to review with him--he should be prescribing it (but I know she is on it)

## 2017-12-17 NOTE — Telephone Encounter (Signed)
Medication refill for Azathioprine / LOV 10/01/17 with Dr. Silvio Pate, 12/06/17 with Dr. Damita Dunnings

## 2017-12-17 NOTE — Telephone Encounter (Signed)
She should be getting this from Dr Lyn Records whoever prescribed it for her

## 2017-12-27 ENCOUNTER — Other Ambulatory Visit: Payer: Self-pay | Admitting: Oncology

## 2017-12-27 ENCOUNTER — Telehealth: Payer: Self-pay | Admitting: *Deleted

## 2017-12-27 NOTE — Telephone Encounter (Signed)
Called pt to let her know that pt had labs at Dr. [name] office 12/27. We can use those labs to do IVIG on 1/7. I did read the notes from Dr. [name] and he stated that the patient felt better and had more energy and he would like her to have another treatment in 2 weeks which will be 1/7 and then she had an appt with PCP on 1/8 so we are giving her the second dose on 1/9 arrive 8:30 for the infusion.  On 1/7 she will need to see md and get treatment. I asked her to arrive at 8:30. She is agreeable to this plan. I also told her that she has another appt with [facility] in 1 month so I will check his note and make sure he wants her to continue another treatment in 4 weeks.  If he feels like she does not need another one then we will cancel the future appts we will make on 1/7. Patient is agreeable.

## 2017-12-27 NOTE — Telephone Encounter (Signed)
-----   Message from Secundino Ginger sent at 12/26/2017  4:11 PM EST ----- 1/9 @ 830 AM  ----- Message ----- From: Luella Cook, RN Sent: 12/26/2017   3:10 PM To: Secundino Ginger  Can you put pt on for day 2 for 01/02/18.  She does not need labs because she had labs done at [facility] clinic on 12/27. When you get appt. Done then I can call pt and let her know. Thanks sherry

## 2017-12-28 ENCOUNTER — Other Ambulatory Visit: Payer: Self-pay | Admitting: *Deleted

## 2017-12-28 DIAGNOSIS — M3320 Polymyositis, organ involvement unspecified: Secondary | ICD-10-CM

## 2017-12-31 ENCOUNTER — Inpatient Hospital Stay: Payer: Medicare Other | Attending: Oncology | Admitting: Oncology

## 2017-12-31 ENCOUNTER — Encounter: Payer: Self-pay | Admitting: Oncology

## 2017-12-31 ENCOUNTER — Other Ambulatory Visit: Payer: Medicare Other

## 2017-12-31 ENCOUNTER — Ambulatory Visit: Payer: Medicare Other

## 2017-12-31 ENCOUNTER — Other Ambulatory Visit: Payer: Self-pay

## 2017-12-31 ENCOUNTER — Inpatient Hospital Stay: Payer: Medicare Other

## 2017-12-31 VITALS — BP 178/82 | HR 90 | Temp 96.5°F | Resp 18 | Wt 175.9 lb

## 2017-12-31 VITALS — BP 168/83 | HR 86 | Temp 97.0°F | Resp 18

## 2017-12-31 DIAGNOSIS — Z794 Long term (current) use of insulin: Secondary | ICD-10-CM | POA: Diagnosis not present

## 2017-12-31 DIAGNOSIS — Z79899 Other long term (current) drug therapy: Secondary | ICD-10-CM | POA: Insufficient documentation

## 2017-12-31 DIAGNOSIS — M3320 Polymyositis, organ involvement unspecified: Secondary | ICD-10-CM | POA: Diagnosis not present

## 2017-12-31 DIAGNOSIS — Z87891 Personal history of nicotine dependence: Secondary | ICD-10-CM | POA: Insufficient documentation

## 2017-12-31 DIAGNOSIS — D649 Anemia, unspecified: Secondary | ICD-10-CM | POA: Diagnosis not present

## 2017-12-31 DIAGNOSIS — E119 Type 2 diabetes mellitus without complications: Secondary | ICD-10-CM | POA: Diagnosis not present

## 2017-12-31 DIAGNOSIS — I10 Essential (primary) hypertension: Secondary | ICD-10-CM | POA: Insufficient documentation

## 2017-12-31 DIAGNOSIS — Z7952 Long term (current) use of systemic steroids: Secondary | ICD-10-CM | POA: Diagnosis not present

## 2017-12-31 MED ORDER — METHYLPREDNISOLONE SODIUM SUCC 1000 MG IJ SOLR: 40.0000 mg | Freq: Once | INTRAMUSCULAR | Status: DC

## 2017-12-31 MED ORDER — SODIUM CHLORIDE 0.9 % IV SOLN
Freq: Once | INTRAVENOUS | Status: AC
  Administered 2017-12-31: 09:00:00 via INTRAVENOUS
  Filled 2017-12-31: qty 1000

## 2017-12-31 MED ORDER — DIPHENHYDRAMINE HCL 25 MG PO CAPS
25.0000 mg | ORAL_CAPSULE | Freq: Once | ORAL | Status: AC
  Administered 2017-12-31: 25 mg via ORAL

## 2017-12-31 MED ORDER — IMMUNE GLOBULIN (HUMAN) 20 GM/200ML IV SOLN
75.0000 g | Freq: Once | INTRAVENOUS | Status: AC
  Administered 2017-12-31: 75 g via INTRAVENOUS
  Filled 2017-12-31: qty 750

## 2017-12-31 MED ORDER — METHYLPREDNISOLONE SODIUM SUCC 40 MG IJ SOLR
40.0000 mg | Freq: Once | INTRAMUSCULAR | Status: AC
  Administered 2017-12-31: 40 mg via INTRAVENOUS
  Filled 2017-12-31: qty 1

## 2017-12-31 MED ORDER — ACETAMINOPHEN 325 MG PO TABS
650.0000 mg | ORAL_TABLET | Freq: Once | ORAL | Status: AC
  Administered 2017-12-31: 650 mg via ORAL

## 2017-12-31 NOTE — Progress Notes (Signed)
Here for follow up. Per pt appetite has decreased with med changes-stated taste in mouth deters her from eating

## 2017-12-31 NOTE — Progress Notes (Addendum)
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  She is currently on Imuran for the same.  Her past medical history is also significant for diabetes for which insulin and metformin and hypertension.  She is also currently on steroids for her myopathy and is currently on 20 mg.  Patient had biopsy of the left deltoid in June 2018 which showed immune mediated inflammatory myopathy.  Plan was to proceed with IVIG if her CK kept going and her recent CT was noted to be 2743 on 11/08/2017 higher than 2405 in October 2018.  Patient had a CT abdomen done in June 2018 which did not reveal any acute findings in chest abdomen or pelvis.  Recent blood work from 11/08/2017 showed white count of 7.7, H&H of 10.3/32.6 with an MCV of 99.1 and a platelet count of 332.  AST and ALT were elevated at 81 4 respectively.    Interval history- she tolerated her IVIG 1st cycle well without any significant side effects. Reports that her upper and lower extremity strength is improving.   ECOG PS- 1 Pain scale- 0   Review of systems- Review of Systems  Constitutional: Negative for chills, fever, malaise/fatigue and weight loss.  HENT: Negative for congestion, ear discharge and nosebleeds.   Eyes: Negative for blurred vision.  Respiratory: Negative for cough, hemoptysis, sputum production, shortness of breath and wheezing.   Cardiovascular: Negative for chest pain, palpitations, orthopnea and claudication.  Gastrointestinal: Negative for abdominal  pain, blood in stool, constipation, diarrhea, heartburn, melena, nausea and vomiting.  Genitourinary: Negative for dysuria, flank pain, frequency, hematuria and urgency.  Musculoskeletal: Negative for back pain, joint pain and myalgias.  Skin: Negative for rash.  Neurological: Negative for dizziness, tingling, focal weakness, seizures, weakness and headaches.  Endo/Heme/Allergies: Does not bruise/bleed easily.  Psychiatric/Behavioral: Negative for depression and suicidal ideas. The patient does not have insomnia.       Allergies  Allergen Reactions  . Atorvastatin Other (See Comments)    myalgias  . Pravastatin Other (See Comments)    Muscle pain     Past Medical History:  Diagnosis Date  . Allergy   . Asthma   . Diabetes mellitus   . Hyperlipidemia   . Hypertension   . Osteoarthritis   . Vitamin B12 deficiency      Past Surgical History:  Procedure Laterality Date  . COLONOSCOPY WITH PROPOFOL N/A 03/07/2016   Procedure: COLONOSCOPY WITH PROPOFOL;  Surgeon: Lollie Sails, MD;  Location: [facility] ENDOSCOPY;  Service: Endoscopy;  Laterality: N/A;    Social History   Socioeconomic History  . Marital status: Widowed    Spouse name: Not on file  . Number of children: 2  . Years of education: Not on file  . Highest education level: Not on file  Social Needs  . Financial resource strain: Not on file  . Food insecurity - worry: Not on file  . Food insecurity - inability: Not on file  . Transportation needs - medical: Not on file  . Transportation needs - non-medical: Not  on file  Occupational History  . Occupation: Retired as Training and development officer at [employer]:    Tobacco Use  . Smoking status: Former Research scientist (life sciences)  . Smokeless tobacco: Never Used  Substance and Sexual Activity  . Alcohol use: No  . Drug use: No  . Sexual activity: Not on file  Other Topics Concern  . Not on file  Social History Narrative   No living will   Son and daughter should make  health care decisions for her   Would accept resuscitation   Not sure about tube feeds    Family History  Problem Relation Age of Onset  . Heart disease Mother   . Cancer Father      Current Outpatient Medications:  .  ACCU-CHEK FASTCLIX LANCETS MISC, Use to test blood sugar twice daily dx: 250.02, Disp: 100 each, Rfl: 3 .  albuterol (PROVENTIL HFA;VENTOLIN HFA) 108 (90 BASE) MCG/ACT inhaler, Inhale 2 puffs into the lungs every 6 (six) hours as needed for wheezing or shortness of breath., Disp: 2 Inhaler, Rfl: 3 .  Alcohol Swabs (B-D SINGLE USE SWABS REGULAR) PADS, Use to test blood sugar once daily dx: 250.00, Disp: 100 each, Rfl: 3 .  alendronate (FOSAMAX) 70 MG tablet, take 1 tablet by mouth every 7 days WITH A FULL GLASS OF WATER; DO NOT LIE DOWN FOR THE NEXT 30 MINUTES, Disp: 4 tablet, Rfl: 11 .  azaTHIOprine (IMURAN) 50 MG tablet, take 3.5 tablets by mouth once daily, Disp: 105 tablet, Rfl: 0 .  Blood Pressure Monitoring (BLOOD PRESSURE CUFF) MISC, Measure blood pressure once daily as directed., Disp: 1 each, Rfl: 0 .  Cholecalciferol (VITAMIN D) 1000 UNITS capsule, Take 1,000 Units by mouth daily.  , Disp: , Rfl:  .  fluticasone (FLOVENT HFA) 110 MCG/ACT inhaler, Inhale 2 puffs into the lungs daily., Disp: 12 g, Rfl: 3 .  glucose blood (ACCU-CHEK SMARTVIEW) test strip, Check blood sugar twice a day and as directed Dx E11.9, Disp: 200 each, Rfl: 1 .  insulin glargine (LANTUS) 100 UNIT/ML injection, 10-20 units in the morning, 10 units in the evening, Disp: 10 mL, Rfl:  .  Insulin Syringes, Disposable, U-100 1 ML MISC, 1 Syringe by Does not apply route 2 (two) times daily., Disp: 100 each, Rfl: 5 .  lisinopril (PRINIVIL,ZESTRIL) 10 MG tablet, take 1 tablet by mouth once daily, Disp: 30 tablet, Rfl: 11 .  metFORMIN (GLUCOPHAGE) 1000 MG tablet, take 1 tablet by mouth twice a day, Disp: 60 tablet, Rfl: 11 .  Multiple Vitamin (MULTIVITAMIN) tablet, Take 1 tablet by mouth daily.  , Disp: ,  Rfl:  .  nystatin (MYCOSTATIN) powder, Apply topically 2 (two) times daily., Disp: 60 g, Rfl: 3 .  predniSONE (DELTASONE) 20 MG tablet, 10 mg QD, Disp: , Rfl:  .  tiZANidine (ZANAFLEX) 4 MG tablet, Take 1 tablet by mouth at bedtime as needed., Disp: , Rfl: 0 No current facility-administered medications for this visit.   Facility-Administered Medications Ordered in Other Visits:  .  Immune Globulin 10% (PRIVIGEN) IV infusion 75 g, 75 g, Intravenous, Once, Sindy Guadeloupe, MD  Physical exam:  Vitals:   12/31/17 0849  BP: (!) 178/82  Pulse: 90  Resp: 18  Temp: (!) 96.5 F (35.8 C)  TempSrc: Tympanic  Weight: 175 lb 14.8 oz (79.8 kg)   Physical Exam  Constitutional: She is oriented to person, place, and time and well-developed, well-nourished, and in no distress.  HENT:  Head:  Normocephalic and atraumatic.  Eyes: EOM are normal. Pupils are equal, round, and reactive to light.  Neck: Normal range of motion.  Cardiovascular: Regular rhythm and normal heart sounds.  tachycardic  Pulmonary/Chest: Effort normal and breath sounds normal.  Abdominal: Soft. Bowel sounds are normal.  Musculoskeletal:  No edema. Compression stockings in place  Neurological: She is alert and oriented to person, place, and time.  Skin: Skin is warm and dry.     CMP Latest Ref Rng & Units 01/02/2017  Glucose 70 - 99 mg/dL 100(H)  BUN 6 - 23 mg/dL 14  Creatinine 0.40 - 1.20 mg/dL 0.56  Sodium 135 - 145 mEq/L 142  Potassium 3.5 - 5.1 mEq/L 4.2  Chloride 96 - 112 mEq/L 103  CO2 19 - 32 mEq/L 31  Calcium 8.4 - 10.5 mg/dL 9.9  Total Protein 6.0 - 8.3 g/dL 7.2  Total Bilirubin 0.2 - 1.2 mg/dL 0.6  Alkaline Phos 39 - 117 U/L 55  AST 0 - 37 U/L 121(H)  ALT 0 - 35 U/L 193(H)   CBC Latest Ref Rng & Units 01/02/2017  WBC 4.0 - 10.5 K/uL 8.8  Hemoglobin 12.0 - 15.0 g/dL 11.8(L)  Hematocrit 36.0 - 46.0 % 36.1  Platelets 150.0 - 400.0 K/uL 390.0      Assessment and plan- Patient is a 72 y.o. female history of  biopsy-proven autoimmune myositis here for cycle 2 of IVIG therapy  She has baseline anemia with hb between 9-10 which is stable. Creatinine normal as per labs checked on 12/20/17. I will see her back in 4 weeks and do a complete anemia work up including iron studies, b12, folate, retic count, TSH and haptoglobin and myeloma panel at that time. Anemia likely due to chronic disease from underlying comorbidities and polymyositis  Patient tolerated her cycle 1 of IVIG well. She will proceed with cycle 2 of IVIG 1gm/kg for 2 days starting today.   Uncontrolled HTN: on repeat check BP was 156/77. Patient states she is asymptomatic. No chest pain, headaches or dizziness. Patients PCP will be notified of high readings   Visit Diagnosis 1. Polymyositis, organ involvement unspecified ([place])   2. Long-term current use of intravenous immunoglobulin (IVIG)      Dr. Randa Evens, MD, MPH [place] at [facility] Pager- [phone] 12/31/2017 9:05 AM

## 2017-12-31 NOTE — Addendum Note (Signed)
Addended by: Luella Cook on: 12/31/2017 11:20 AM   Modules accepted: Orders

## 2017-12-31 NOTE — Progress Notes (Signed)
1500 Patient began feeling like her sugar is dropping. Asked if I could do a fingerstick. I told her we do not have a glucometer. Patient took insulin and metformin this am. All she had to eat in clinic was nabs and a few cookies. Gave patient chicken and rice from our breakroom and regular cola to drink. Held next titration of IVIG. No drop in BP. 1540 Patient states she is feeling much better.

## 2018-01-01 ENCOUNTER — Encounter: Payer: Self-pay | Admitting: Internal Medicine

## 2018-01-01 ENCOUNTER — Ambulatory Visit: Payer: Medicare Other | Admitting: Internal Medicine

## 2018-01-01 VITALS — BP 138/78 | HR 84 | Temp 97.8°F | Wt 176.0 lb

## 2018-01-01 DIAGNOSIS — E119 Type 2 diabetes mellitus without complications: Secondary | ICD-10-CM

## 2018-01-01 DIAGNOSIS — Z794 Long term (current) use of insulin: Secondary | ICD-10-CM

## 2018-01-01 DIAGNOSIS — M3320 Polymyositis, organ involvement unspecified: Secondary | ICD-10-CM

## 2018-01-01 MED ORDER — INSULIN GLARGINE 100 UNIT/ML ~~LOC~~ SOLN
15.0000 [IU] | Freq: Every day | SUBCUTANEOUS | 0 refills | Status: DC
Start: 2018-01-01 — End: 2018-03-20

## 2018-01-01 MED ORDER — IMMUNE GLOBULIN (HUMAN) 20 GM/200ML IV SOLN
75.0000 g | Freq: Once | INTRAVENOUS | Status: AC
  Administered 2018-01-02: 75 g via INTRAVENOUS
  Filled 2018-01-01: qty 600

## 2018-01-01 NOTE — Assessment & Plan Note (Signed)
Only control issues due to prednisone Now better as it is weaned Immediate goal is A1c under 9% till off prednisone and avoid hypoglycemia---so will cut lantus to just 15 units daily

## 2018-01-01 NOTE — Assessment & Plan Note (Signed)
Significant improvement Prednisone has been weaned On azathioprine and IVIG

## 2018-01-01 NOTE — Progress Notes (Signed)
Subjective:    Patient ID: Bridget Romero, female    DOB: 1946/04/10, 72 y.o.   MRN: [id number]  HPI Here for follow up of polymyositis and diabetes No longer needs assistance with walking Drives and does all her instrumental ADLs  Azathioprine and IV IgG Still on prednisone but down to 10mg  daily  Has had some low sugar reactions--especially in the morning Taste is off so not eating as much Uses lantus 15/8 and metformin bid  Current Outpatient Medications on File Prior to Visit  Medication Sig Dispense Refill  . ACCU-CHEK FASTCLIX LANCETS MISC Use to test blood sugar twice daily dx: 250.02 100 each 3  . albuterol (PROVENTIL HFA;VENTOLIN HFA) 108 (90 BASE) MCG/ACT inhaler Inhale 2 puffs into the lungs every 6 (six) hours as needed for wheezing or shortness of breath. 2 Inhaler 3  . Alcohol Swabs (B-D SINGLE USE SWABS REGULAR) PADS Use to test blood sugar once daily dx: 250.00 100 each 3  . alendronate (FOSAMAX) 70 MG tablet take 1 tablet by mouth every 7 days WITH A FULL GLASS OF WATER; DO NOT LIE DOWN FOR THE NEXT 30 MINUTES 4 tablet 11  . azaTHIOprine (IMURAN) 50 MG tablet take 3.5 tablets by mouth once daily 105 tablet 0  . Blood Pressure Monitoring (BLOOD PRESSURE CUFF) MISC Measure blood pressure once daily as directed. 1 each 0  . Cholecalciferol (VITAMIN D) 1000 UNITS capsule Take 1,000 Units by mouth daily.      . fluticasone (FLOVENT HFA) 110 MCG/ACT inhaler Inhale 2 puffs into the lungs daily. 12 g 3  . glucose blood (ACCU-CHEK SMARTVIEW) test strip Check blood sugar twice a day and as directed Dx E11.9 200 each 1  . insulin glargine (LANTUS) 100 UNIT/ML injection 10-20 units in the morning, 10 units in the evening 10 mL   . Insulin Syringes, Disposable, U-100 1 ML MISC 1 Syringe by Does not apply route 2 (two) times daily. 100 each 5  . lisinopril (PRINIVIL,ZESTRIL) 10 MG tablet take 1 tablet by mouth once daily 30 tablet 11  . metFORMIN (GLUCOPHAGE) 1000 MG tablet take  1 tablet by mouth twice a day 60 tablet 11  . Multiple Vitamin (MULTIVITAMIN) tablet Take 1 tablet by mouth daily.      . predniSONE (DELTASONE) 20 MG tablet 10 mg QD    . tiZANidine (ZANAFLEX) 4 MG tablet Take 1 tablet by mouth at bedtime as needed.  0   No current facility-administered medications on file prior to visit.     Allergies  Allergen Reactions  . Atorvastatin Other (See Comments)    myalgias  . Pravastatin Other (See Comments)    Muscle pain    Past Medical History:  Diagnosis Date  . Allergy   . Asthma   . Diabetes mellitus   . Hyperlipidemia   . Hypertension   . Osteoarthritis   . Vitamin B12 deficiency     Past Surgical History:  Procedure Laterality Date  . COLONOSCOPY WITH PROPOFOL N/A 03/07/2016   Procedure: COLONOSCOPY WITH PROPOFOL;  Surgeon: Lollie Sails, MD;  Location[facility]COPY;  Service: Endoscopy;  Laterality: N/A;    Family History  Problem Relation Age of Onset  . Heart disease Mother   . Cancer Father     Social History   Socioeconomic History  . Marital status: Widowed    Spouse name: Not on file  . Number of children: 2  . Years of education: Not on file  .  Highest education level: Not on file  Social Needs  . Financial resource strain: Not on file  . Food insecurity - worry: Not on file  . Food insecurity - inability: Not on file  . Transportation needs - medical: Not on file  . Transportation needs - non-medical: Not on file  Occupational History  . Occupation: Retired as Training and development officer at [employer]:    Tobacco Use  . Smoking status: Former Research scientist (life sciences)  . Smokeless tobacco: Never Used  Substance and Sexual Activity  . Alcohol use: No  . Drug use: No  . Sexual activity: Not on file  Other Topics Concern  . Not on file  Social History Narrative   No living will   Son and daughter should make health care decisions for her   Would accept resuscitation   Not sure about tube feeds   Review of  Systems Has lost 10# with illness but is stabilized now Sleeping pretty well Mood is okay--stays upbeat for the most part    Objective:   Physical Exam  Neck: No thyromegaly present.  Cardiovascular: Normal rate, regular rhythm and normal heart sounds. Exam reveals no gallop.  No murmur heard. Pulmonary/Chest: Effort normal and breath sounds normal. No respiratory distress. She has no wheezes. She has no rales.  Musculoskeletal: She exhibits no edema.  Lymphadenopathy:    She has no cervical adenopathy.  Neurological:  Able to walk fairly regularly but trouble getting up the step          Assessment & Plan:

## 2018-01-02 ENCOUNTER — Inpatient Hospital Stay: Payer: Medicare Other

## 2018-01-02 VITALS — BP 167/87 | HR 85 | Temp 97.2°F | Resp 18 | Wt 176.0 lb

## 2018-01-02 DIAGNOSIS — M3320 Polymyositis, organ involvement unspecified: Secondary | ICD-10-CM | POA: Diagnosis not present

## 2018-01-02 MED ORDER — DIPHENHYDRAMINE HCL 25 MG PO CAPS
25.0000 mg | ORAL_CAPSULE | Freq: Once | ORAL | Status: AC
  Administered 2018-01-02: 25 mg via ORAL
  Filled 2018-01-02: qty 1

## 2018-01-02 MED ORDER — ACETAMINOPHEN 325 MG PO TABS
650.0000 mg | ORAL_TABLET | Freq: Once | ORAL | Status: AC
  Administered 2018-01-02: 650 mg via ORAL
  Filled 2018-01-02: qty 2

## 2018-01-02 MED ORDER — SODIUM CHLORIDE 0.9 % IV SOLN: 40.0000 mg | Freq: Once | INTRAVENOUS | Status: DC

## 2018-01-02 MED ORDER — SODIUM CHLORIDE 0.9 % IV SOLN
Freq: Once | INTRAVENOUS | Status: AC
Start: 2018-01-02 — End: 2018-01-02
  Administered 2018-01-02: 09:00:00 via INTRAVENOUS
  Filled 2018-01-02: qty 1000

## 2018-01-02 MED ORDER — METHYLPREDNISOLONE SODIUM SUCC 125 MG IJ SOLR
40.0000 mg | Freq: Once | INTRAMUSCULAR | Status: AC
  Administered 2018-01-02: 40 mg via INTRAVENOUS

## 2018-01-22 ENCOUNTER — Telehealth: Payer: Self-pay | Admitting: Internal Medicine

## 2018-01-22 NOTE — Telephone Encounter (Signed)
Copied from [place]. Topic: General - Other >> Jan 22, 2018  3:00 PM Darl Householder, RMA wrote: Reason for CRM: Patient is requesting a call back from Dr. Silvio Pate or CMA  to discuss DM That is all the information pt would give

## 2018-01-22 NOTE — Telephone Encounter (Signed)
Spoke to pt who is requesting to know if she is needing to RTO for a lab appt to have her A1C done. It was not completed with her recent labs

## 2018-01-23 NOTE — Telephone Encounter (Signed)
It had been done in October and it was probably too soon. I am planning to repeat this at her visit in April---hopefully this will give her time to get the diabetes back under better control with the lowered doses of prednisone

## 2018-01-23 NOTE — Telephone Encounter (Signed)
Spoke to pt. She agrees with the plan

## 2018-01-28 ENCOUNTER — Telehealth: Payer: Self-pay | Admitting: Pharmacist

## 2018-01-28 NOTE — Telephone Encounter (Signed)
Spoke with Anderson Malta, RN at Dr. Scharlene Gloss office. MD wants to do one more treatment of IVIG as prescribed on 2.5.19 and 01/30/18. He will assess patient to determine further need.

## 2018-01-29 ENCOUNTER — Inpatient Hospital Stay: Payer: Medicare Other

## 2018-01-29 ENCOUNTER — Inpatient Hospital Stay: Payer: Medicare Other | Attending: Internal Medicine | Admitting: Internal Medicine

## 2018-01-29 ENCOUNTER — Encounter: Payer: Self-pay | Admitting: Internal Medicine

## 2018-01-29 VITALS — BP 153/80 | HR 87 | Temp 97.6°F | Resp 18

## 2018-01-29 VITALS — BP 148/73 | HR 77 | Temp 97.8°F | Resp 16 | Wt 172.4 lb

## 2018-01-29 DIAGNOSIS — M3320 Polymyositis, organ involvement unspecified: Secondary | ICD-10-CM

## 2018-01-29 DIAGNOSIS — I10 Essential (primary) hypertension: Secondary | ICD-10-CM

## 2018-01-29 DIAGNOSIS — D649 Anemia, unspecified: Secondary | ICD-10-CM | POA: Diagnosis not present

## 2018-01-29 LAB — CBC WITH DIFFERENTIAL/PLATELET
BASOS ABS: 0 10*3/uL (ref 0–0.1)
BASOS PCT: 1 %
Eosinophils Absolute: 0.1 10*3/uL (ref 0–0.7)
Eosinophils Relative: 1 %
HEMATOCRIT: 30.8 % — AB (ref 35.0–47.0)
HEMOGLOBIN: 10.3 g/dL — AB (ref 12.0–16.0)
LYMPHS PCT: 20 %
Lymphs Abs: 0.9 10*3/uL — ABNORMAL LOW (ref 1.0–3.6)
MCH: 33.7 pg (ref 26.0–34.0)
MCHC: 33.6 g/dL (ref 32.0–36.0)
MCV: 100.3 fL — AB (ref 80.0–100.0)
Monocytes Absolute: 0.2 10*3/uL (ref 0.2–0.9)
Monocytes Relative: 4 %
NEUTROS ABS: 3.5 10*3/uL (ref 1.4–6.5)
NEUTROS PCT: 74 %
Platelets: 269 10*3/uL (ref 150–440)
RBC: 3.07 MIL/uL — ABNORMAL LOW (ref 3.80–5.20)
RDW: 15.9 % — ABNORMAL HIGH (ref 11.5–14.5)
WBC: 4.7 10*3/uL (ref 3.6–11.0)

## 2018-01-29 LAB — IRON AND TIBC
Iron: 82 ug/dL (ref 28–170)
SATURATION RATIOS: 30 % (ref 10.4–31.8)
TIBC: 276 ug/dL (ref 250–450)
UIBC: 194 ug/dL

## 2018-01-29 LAB — VITAMIN B12: VITAMIN B 12: 186 pg/mL (ref 180–914)

## 2018-01-29 LAB — RETICULOCYTES
RBC.: 3.12 MIL/uL — ABNORMAL LOW (ref 3.80–5.20)
Retic Count, Absolute: 37.4 10*3/uL (ref 19.0–183.0)
Retic Ct Pct: 1.2 % (ref 0.4–3.1)

## 2018-01-29 LAB — FOLATE: Folate: 28 ng/mL (ref >5.9)

## 2018-01-29 LAB — FERRITIN: FERRITIN: 97 ng/mL (ref 11–307)

## 2018-01-29 MED ORDER — DIPHENHYDRAMINE HCL 25 MG PO CAPS
ORAL_CAPSULE | ORAL | Status: AC
  Filled 2018-01-29: qty 1

## 2018-01-29 MED ORDER — IMMUNE GLOBULIN (HUMAN) 5 GM/50ML IV SOLN
75.0000 g | INTRAVENOUS | Status: DC
  Administered 2018-01-29: 11:00:00 75 g via INTRAVENOUS
  Filled 2018-01-29 (×3): qty 50

## 2018-01-29 MED ORDER — DIPHENHYDRAMINE HCL 25 MG PO CAPS
25.0000 mg | ORAL_CAPSULE | Freq: Once | ORAL | Status: AC
Start: 2018-01-29 — End: 2018-01-29
  Administered 2018-01-29: 25 mg via ORAL

## 2018-01-29 MED ORDER — ACETAMINOPHEN 325 MG PO TABS
650.0000 mg | ORAL_TABLET | Freq: Once | ORAL | Status: AC
Start: 2018-01-29 — End: 2018-01-29
  Administered 2018-01-29: 650 mg via ORAL

## 2018-01-29 MED ORDER — ACETAMINOPHEN 325 MG PO TABS
ORAL_TABLET | ORAL | Status: AC
  Filled 2018-01-29: qty 2

## 2018-01-29 MED ORDER — METHYLPREDNISOLONE SODIUM SUCC 125 MG IJ SOLR
40.0000 mg | Freq: Once | INTRAMUSCULAR | Status: AC
  Administered 2018-01-29: 40 mg via INTRAVENOUS

## 2018-01-29 NOTE — Patient Instructions (Signed)

## 2018-01-29 NOTE — Progress Notes (Signed)
[facility] OFFICE PROGRESS NOTE  Patient Care Team: [name], MD as PCP - General  Cancer Staging No matching staging information was found for the patient.    No history exists.     This is my first interaction with the patient as patient's primary oncologist has been Dr.Rao. I reviewed the patient's prior charts/pertinent labs in detail.    INTERVAL HISTORY:  Bridget Romero 72 y.o.  female pleasant patient with history of polymyositis status post biopsy-follows with Dr. [name].  As per rheumatology she is currently on IVIG infusions.  She is currently status post 2 infusions.  Patient also has some dramatic improvement of her bilateral upper and lower extremity strength.  Denies any falls.  Denies any headaches or vision changes post infusion.  Denies any fatigue.  Denies any blood in stools or black stools.  REVIEW OF SYSTEMS:  A complete 10 point review of system is done which is negative except mentioned above/history of present illness.   PAST MEDICAL HISTORY :  Past Medical History:  Diagnosis Date  . Allergy   . Asthma   . Diabetes mellitus   . Hyperlipidemia   . Hypertension   . Osteoarthritis   . Vitamin B12 deficiency     PAST SURGICAL HISTORY :   Past Surgical History:  Procedure Laterality Date  . COLONOSCOPY WITH PROPOFOL N/A 03/07/2016   Procedure: COLONOSCOPY WITH PROPOFOL;  Surgeon: Lollie Sails, MD;  Location: [facility] ENDOSCOPY;  Service: Endoscopy;  Laterality: N/A;    FAMILY HISTORY :   Family History  Problem Relation Age of Onset  . Heart disease Mother   . Cancer Father     SOCIAL HISTORY:   Social History   Tobacco Use  . Smoking status: Former Research scientist (life sciences)  . Smokeless tobacco: Never Used  Substance Use Topics  . Alcohol use: No  . Drug use: No    ALLERGIES:  is allergic to atorvastatin and pravastatin.  MEDICATIONS:  Current Outpatient Medications  Medication Sig Dispense Refill  . ACCU-CHEK FASTCLIX  LANCETS MISC Use to test blood sugar twice daily dx: 250.02 100 each 3  . albuterol (PROVENTIL HFA;VENTOLIN HFA) 108 (90 BASE) MCG/ACT inhaler Inhale 2 puffs into the lungs every 6 (six) hours as needed for wheezing or shortness of breath. 2 Inhaler 3  . Alcohol Swabs (B-D SINGLE USE SWABS REGULAR) PADS Use to test blood sugar once daily dx: 250.00 100 each 3  . alendronate (FOSAMAX) 70 MG tablet take 1 tablet by mouth every 7 days WITH A FULL GLASS OF WATER; DO NOT LIE DOWN FOR THE NEXT 30 MINUTES 4 tablet 11  . azaTHIOprine (IMURAN) 50 MG tablet take 3.5 tablets by mouth once daily 105 tablet 0  . Blood Pressure Monitoring (BLOOD PRESSURE CUFF) MISC Measure blood pressure once daily as directed. 1 each 0  . Cholecalciferol (VITAMIN D) 1000 UNITS capsule Take 1,000 Units by mouth daily.      . fluticasone (FLOVENT HFA) 110 MCG/ACT inhaler Inhale 2 puffs into the lungs daily. 12 g 3  . glucose blood (ACCU-CHEK SMARTVIEW) test strip Check blood sugar twice a day and as directed Dx E11.9 200 each 1  . insulin glargine (LANTUS) 100 UNIT/ML injection Inject 0.15 mLs (15 Units total) into the skin daily. 1 mL 0  . Insulin Syringes, Disposable, U-100 1 ML MISC 1 Syringe by Does not apply route 2 (two) times daily. 100 each 5  . lisinopril (PRINIVIL,ZESTRIL) 10 MG tablet  take 1 tablet by mouth once daily 30 tablet 11  . metFORMIN (GLUCOPHAGE) 1000 MG tablet take 1 tablet by mouth twice a day 60 tablet 11  . Multiple Vitamin (MULTIVITAMIN) tablet Take 1 tablet by mouth daily.      . predniSONE (DELTASONE) 20 MG tablet 10 mg QD (Patient taking differently: 10 mg. 10 mg QD)    . tiZANidine (ZANAFLEX) 4 MG tablet Take 1 tablet by mouth at bedtime as needed.  0   No current facility-administered medications for this visit.     PHYSICAL EXAMINATION: ECOG PERFORMANCE STATUS: 0 - Asymptomatic  BP (!) 148/73 (BP Location: Left Arm, Patient Position: Sitting)   Pulse 77   Temp 97.8 F (36.6 C) (Tympanic)    Resp 16   Wt 172 lb 6.4 oz (78.2 kg)   BMI 27.00 kg/m   Filed Weights   01/29/18 0936  Weight: 172 lb 6.4 oz (78.2 kg)    GENERAL: Well-nourished well-developed; Alert, no distress and comfortable.   Alone. EYES: no pallor or icterus OROPHARYNX: no thrush or ulceration; good dentition  NECK: supple, no masses felt LYMPH:  no palpable lymphadenopathy in the cervical, axillary or inguinal regions LUNGS: clear to auscultation and  No wheeze or crackles HEART/CVS: regular rate & rhythm and no murmurs; No lower extremity edema ABDOMEN:abdomen soft, non-tender and normal bowel sounds Musculoskeletal:no cyanosis of digits and no clubbing  PSYCH: alert & oriented x 3 with fluent speech NEURO: no focal motor/sensory deficits SKIN:  no rashes or significant lesions  LABORATORY DATA:  I have reviewed the data as listed    Component Value Date/Time   NA 142 01/02/2017 1127   K 4.2 01/02/2017 1127   CL 103 01/02/2017 1127   CO2 31 01/02/2017 1127   GLUCOSE 100 (H) 01/02/2017 1127   BUN 14 01/02/2017 1127   CREATININE 0.56 01/02/2017 1127   CALCIUM 9.9 01/02/2017 1127   PROT 7.2 01/02/2017 1127   ALBUMIN 4.0 01/02/2017 1127   AST 121 (H) 01/02/2017 1127   ALT 193 (H) 01/02/2017 1127   ALKPHOS 55 01/02/2017 1127   BILITOT 0.6 01/02/2017 1127   GFRNONAA 99.01 09/13/2010 1036   GFRAA 94 02/23/2009 0824    No results found for: SPEP, UPEP  Lab Results  Component Value Date   WBC 4.7 01/29/2018   NEUTROABS 3.5 01/29/2018   HGB 10.3 (L) 01/29/2018   HCT 30.8 (L) 01/29/2018   MCV 100.3 (H) 01/29/2018   PLT 269 01/29/2018      Chemistry      Component Value Date/Time   NA 142 01/02/2017 1127   K 4.2 01/02/2017 1127   CL 103 01/02/2017 1127   CO2 31 01/02/2017 1127   BUN 14 01/02/2017 1127   CREATININE 0.56 01/02/2017 1127      Component Value Date/Time   CALCIUM 9.9 01/02/2017 1127   ALKPHOS 55 01/02/2017 1127   AST 121 (H) 01/02/2017 1127   ALT 193 (H) 01/02/2017  1127   BILITOT 0.6 01/02/2017 1127       RADIOGRAPHIC STUDIES: I have personally reviewed the radiological images as listed and agreed with the findings in the report. No results found.   ASSESSMENT & PLAN:  Polymyositis, organ involvement unspecified [facility]) Patient is a 72 y.o. female history of biopsy-proven autoimmune myositis here for cycle 3 of IVIG therapy.  Symptomatic improvement noted.  # Need for further IVIG infusions would be depend upon evaluation with Dr. [name].  Discussed with pharmacy.  #  Anemia- Hb 10 today; work up pending.   # Elevated Blood pressure-148/73; recommend checking at home also.   # follow up with Dr.rao/in 4 weeks; possible IVIG infusions.   Cc; Dr.Wally Kernodle   No orders of the defined types were placed in this encounter.  All questions were answered. The patient knows to call the clinic with any problems, questions or concerns.      Cammie Sickle, MD 01/29/2018 10:05 AM

## 2018-01-29 NOTE — Assessment & Plan Note (Addendum)
Patient is a 71 y.o. female history of biopsy-proven autoimmune myositis here for cycle 3 of IVIG therapy.  Symptomatic improvement noted.  # Need for further IVIG infusions would be depend upon evaluation with Dr. [name].  Discussed with pharmacy.  # Anemia- Hb 10 today; work up pending.   # Elevated Blood pressure-148/73; recommend checking at home also.   # follow up with Dr.rao/in 4 weeks; possible IVIG infusions.   Cc; Dr.Wally [name]

## 2018-01-30 ENCOUNTER — Inpatient Hospital Stay: Payer: Medicare Other

## 2018-01-30 VITALS — BP 159/82 | HR 71 | Temp 98.1°F | Resp 18

## 2018-01-30 DIAGNOSIS — M3320 Polymyositis, organ involvement unspecified: Secondary | ICD-10-CM | POA: Diagnosis not present

## 2018-01-30 MED ORDER — SODIUM CHLORIDE 0.9 % IV SOLN
Freq: Once | INTRAVENOUS | Status: AC
  Administered 2018-01-30: 09:00:00 via INTRAVENOUS
  Filled 2018-01-30: qty 1000

## 2018-01-30 MED ORDER — ACETAMINOPHEN 325 MG PO TABS
650.0000 mg | ORAL_TABLET | Freq: Once | ORAL | Status: AC
  Administered 2018-01-30: 650 mg via ORAL
  Filled 2018-01-30: qty 2

## 2018-01-30 MED ORDER — METHYLPREDNISOLONE SODIUM SUCC 125 MG IJ SOLR
40.0000 mg | Freq: Once | INTRAMUSCULAR | Status: AC
  Administered 2018-01-30: 40 mg via INTRAVENOUS
  Filled 2018-01-30: qty 2

## 2018-01-30 MED ORDER — DIPHENHYDRAMINE HCL 25 MG PO CAPS
25.0000 mg | ORAL_CAPSULE | Freq: Once | ORAL | Status: AC
Start: 2018-01-30 — End: 2018-01-30
  Administered 2018-01-30: 25 mg via ORAL
  Filled 2018-01-30: qty 1

## 2018-01-30 MED ORDER — IMMUNE GLOBULIN (HUMAN) 5 GM/50ML IV SOLN
75.0000 g | INTRAVENOUS | Status: AC
  Administered 2018-01-30: 75 g via INTRAVENOUS
  Filled 2018-01-30: qty 50

## 2018-01-30 NOTE — Patient Instructions (Signed)
Immunoglobulin Quantification Test Why am I having this test? This is a blood test that looks for abnormal immunoglobulins, which are proteins within the blood. Abnormal immunoglobulins are associated with various types of diseases. What kind of sample is taken? A blood sample is required for this test. It is usually collected by inserting a needle into a vein. How do I prepare for this test? There is no preparation required for the test. What are the reference ranges? Reference ranges are considered healthy ranges established after testing a large group of healthy people. Reference ranges may vary among different people, labs, and hospitals. It is your responsibility to obtain your test results. Ask the lab or department performing the test when and how you will get your results. Reference ranges are as follows, but the results of this test will vary based on your age and the method that is used to assess immunoglobulin concentrations.  Immunoglobulin G (IgG). ? Adults: 962-2,297 mg/dL. ? Children: 250-1,600 mg/dL.  Immunoglobulin A (IgA). ? Adults: 85-385 mg/dL. ? Children: 1-350 mg/dL.  Immunoglobulin M (IgM). ? Adults: 55-375 mg/dL. ? Children: 20-200 mg/dL.  Immunoglobulin D (IgD) and Immunoglobulin E (IgE). ? Minimal.  What do the results mean?  Increased levels of IgG may indicate: ? Different infections. ? Autoimmune diseases. ? Chronic liver disease.  Decreased levels of IgG may be associated with: ? Acquired immunodeficiency syndrome (AIDS). ? Different types of cancer. ? Various causes of suppressed immunity.  Increased levels of IgA may indicate: ? Chronic liver diseases. ? Chronic infections.  Decreased levels of IgA may be associated with: ? Various causes of immunoglobulin deficiency.  Increased levels of IgM may indicate: ? Certain rare cancers. ? Different Infections. ? Autoimmune diseases. ? Chronic liver conditions.  Decreased levels of IgM may be  associated with: ? AIDS. ? Various causes of immunoglobulin deficiency. ? Various causes of suppressed immunity.  Increased levels of IgE may indicate: ? Allergic reactions or allergic infections.  Decreased levels of IgE may indicate: ? Inherited immunoglobulin deficiency. Talk with your health care provider to discuss your results, treatment options, and if necessary, the need for more tests. Talk with your health care provider if you have any questions about your results. Talk with your health care provider to discuss your results, treatment options, and if necessary, the need for more tests. Talk with your health care provider if you have any questions about your results. This information is not intended to replace advice given to you by your health care provider. Make sure you discuss any questions you have with your health care provider. Document Released: 01/13/2005 Document Revised: 08/16/2016 Document Reviewed: 06/04/2014 Elsevier Interactive Patient Education  [employer].

## 2018-01-31 ENCOUNTER — Other Ambulatory Visit: Payer: Self-pay | Admitting: Oncology

## 2018-01-31 LAB — MULTIPLE MYELOMA PANEL, SERUM
ALBUMIN SERPL ELPH-MCNC: 3.5 g/dL (ref 2.9–4.4)
ALBUMIN/GLOB SERPL: 1.1 (ref 0.7–1.7)
ALPHA 1: 0.3 g/dL (ref 0.0–0.4)
ALPHA2 GLOB SERPL ELPH-MCNC: 0.6 g/dL (ref 0.4–1.0)
B-Globulin SerPl Elph-Mcnc: 0.9 g/dL (ref 0.7–1.3)
GAMMA GLOB SERPL ELPH-MCNC: 1.4 g/dL (ref 0.4–1.8)
Globulin, Total: 3.2 g/dL (ref 2.2–3.9)
IGA: 115 mg/dL (ref 87–352)
IGG (IMMUNOGLOBIN G), SERUM: 1402 mg/dL (ref 700–1600)
IgM (Immunoglobulin M), Srm: 91 mg/dL (ref 26–217)
Total Protein ELP: 6.7 g/dL (ref 6.0–8.5)

## 2018-02-01 ENCOUNTER — Telehealth: Payer: Self-pay | Admitting: *Deleted

## 2018-02-01 NOTE — Telephone Encounter (Signed)
Tried to call patient about her low vitamin B12 levels and the need for monthly B12 injections. There was no answer and patient does not have an answering machine. Will try again on Monday.       dhs

## 2018-02-07 LAB — HEMOGLOBIN A1C: HEMOGLOBIN A1C: 6.9

## 2018-02-08 ENCOUNTER — Encounter: Payer: Self-pay | Admitting: Internal Medicine

## 2018-02-08 ENCOUNTER — Telehealth: Payer: Self-pay | Admitting: *Deleted

## 2018-02-08 NOTE — Telephone Encounter (Signed)
Abstracted result

## 2018-02-08 NOTE — Telephone Encounter (Signed)
Spoke to pt. She will keep a check on her sugar levels.

## 2018-02-08 NOTE — Telephone Encounter (Signed)
That is great! If she is not having any low sugar reactions, I would just keep her medications the same. Abstract the A1c please
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
## 2018-02-18 ENCOUNTER — Other Ambulatory Visit: Payer: Self-pay | Admitting: Internal Medicine

## 2018-02-19 ENCOUNTER — Other Ambulatory Visit: Payer: Self-pay | Admitting: Internal Medicine

## 2018-02-22 ENCOUNTER — Telehealth: Payer: Self-pay | Admitting: *Deleted

## 2018-02-22 NOTE — Telephone Encounter (Signed)
Patient called today to cancel her appt for IVIG per Dr. Precious Reel.  I read his note and it did say hold off IVIG for now. I sent hin and inbasket because when I called main number at [facility] they said the office was closed. Below is my message to Dr. [name] and I spoke with patient also that we need to be contacted if she needs to start back on IVIG. And that I was sending electronic messsage to Dr. [name].  Electronic message; Dr. [name], The patient called today to let us know that you have put pt on hold for IVIG for now.   If you can let us know if she needs to go back on the IVIG because as of today's date we do not have f/u appt for her for the future.    Thank you  Moishe Spice Nurse for Dr. Janese Banks

## 2018-02-23 ENCOUNTER — Emergency Department
Admission: EM | Admit: 2018-02-23 | Discharge: 2018-02-24 | Disposition: A | Discharge status: Home / Self Care | Payer: No Typology Code available for payment source | Attending: Emergency Medicine | Admitting: Emergency Medicine

## 2018-02-23 ENCOUNTER — Other Ambulatory Visit: Payer: Self-pay

## 2018-02-23 ENCOUNTER — Emergency Department: Payer: No Typology Code available for payment source

## 2018-02-23 ENCOUNTER — Encounter: Payer: Self-pay | Admitting: Emergency Medicine

## 2018-02-23 DIAGNOSIS — M25551 Pain in right hip: Secondary | ICD-10-CM | POA: Insufficient documentation

## 2018-02-23 DIAGNOSIS — E119 Type 2 diabetes mellitus without complications: Secondary | ICD-10-CM | POA: Insufficient documentation

## 2018-02-23 DIAGNOSIS — M542 Cervicalgia: Secondary | ICD-10-CM | POA: Insufficient documentation

## 2018-02-23 DIAGNOSIS — J45909 Unspecified asthma, uncomplicated: Secondary | ICD-10-CM | POA: Diagnosis not present

## 2018-02-23 DIAGNOSIS — Z794 Long term (current) use of insulin: Secondary | ICD-10-CM | POA: Diagnosis not present

## 2018-02-23 DIAGNOSIS — M545 Low back pain: Secondary | ICD-10-CM | POA: Insufficient documentation

## 2018-02-23 DIAGNOSIS — I10 Essential (primary) hypertension: Secondary | ICD-10-CM | POA: Diagnosis not present

## 2018-02-23 DIAGNOSIS — Z87891 Personal history of nicotine dependence: Secondary | ICD-10-CM | POA: Insufficient documentation

## 2018-02-23 DIAGNOSIS — Z79899 Other long term (current) drug therapy: Secondary | ICD-10-CM | POA: Diagnosis not present

## 2018-02-23 MED ORDER — MELOXICAM 7.5 MG PO TABS: 7.5000 mg | ORAL_TABLET | Freq: Every day | ORAL | 1 refills | Status: AC

## 2018-02-23 MED ORDER — MELOXICAM 7.5 MG PO TABS
7.5000 mg | ORAL_TABLET | Freq: Once | ORAL | Status: AC
  Administered 2018-02-24: 7.5 mg via ORAL
  Filled 2018-02-23: qty 1

## 2018-02-23 MED ORDER — CYCLOBENZAPRINE HCL 5 MG PO TABS: 5.0000 mg | ORAL_TABLET | Freq: Three times a day (TID) | ORAL | 0 refills | Status: AC | PRN
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
## 2018-02-25 ENCOUNTER — Ambulatory Visit: Payer: Medicare Other

## 2018-02-25 ENCOUNTER — Other Ambulatory Visit: Payer: Medicare Other

## 2018-02-25 ENCOUNTER — Ambulatory Visit: Payer: Medicare Other | Admitting: Oncology

## 2018-02-26 ENCOUNTER — Telehealth: Payer: Self-pay

## 2018-02-26 ENCOUNTER — Ambulatory Visit: Payer: Medicare Other
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
## 2018-03-20 ENCOUNTER — Other Ambulatory Visit: Payer: Self-pay | Admitting: Internal Medicine

## 2018-03-25 ENCOUNTER — Ambulatory Visit: Payer: Self-pay

## 2018-03-25 NOTE — Telephone Encounter (Signed)
Pt calling c/o 2 weeks h/o mild diarrhea after taking her Metformin. Denies fever, abdominal pain, bloody stools or dizziness. Pt is drinking water but encouraged to increase water intake to at least 8 cups per day. Pt advised that Metformin side effect is diarrhea. Pt encouraged to watch carbohydrates and fried foods which can make the diarrhea worse. Pt pt advised to call back per care advice if she worsens.  Reason for Disposition . [1] MILD diarrhea (e.g., 1-3 or more stools than normal in past 24 hours) without known cause AND [2] present >  7 days . MILD-MODERATE diarrhea (e.g., 1-6 times / day more than normal)  Answer Assessment - Initial Assessment Questions 1. DIARRHEA SEVERITY: "How bad is the diarrhea?" "How many extra stools have you had in the past 24 hours than normal?"    - MILD: Few loose or mushy BMs; increase of 1-3 stools over normal daily number of stools; mild increase in ostomy output.   - MODERATE: Increase of 4-6 stools daily over normal; moderate increase in ostomy output.   - SEVERE (or Worst Possible): Increase of 7 or more stools daily over normal; moderate increase in ostomy output; incontinence.     Mild to moderate sometimes in the evening after pt takes Metformin she notices more occasions of diarrhea 2. ONSET: "When did the diarrhea begin?"      2 weeks ago 3. BM CONSISTENCY: "How loose or watery is the diarrhea?"      Watery most of the time occasionally has stool. 4. VOMITING: "Are you also vomiting?" If so, ask: "How many times in the past 24 hours?"      no 5. ABDOMINAL PAIN: "Are you having any abdominal pain?" If yes: "What does it feel like?" (e.g., crampy, dull, intermittent, constant)      no 6. ABDOMINAL PAIN SEVERITY: If present, ask: "How bad is the pain?"  (e.g., Scale 1-10; mild, moderate, or severe)    - MILD (1-3): doesn't interfere with normal activities, abdomen soft and not tender to touch     - MODERATE (4-7): interferes with normal  activities or awakens from sleep, tender to touch     - SEVERE (8-10): excruciating pain, doubled over, unable to do any normal activities   n/a 7. ORAL INTAKE: If vomiting, "Have you been able to drink liquids?" "How much fluids have you had in the past 24 hours?"     Pt is drinking water 8. HYDRATION: "Any signs of dehydration?" (e.g., dry mouth [not just dry lips], too weak to stand, dizziness, new weight loss) "When did you last urinate?"     No weakness, last urinated this am  9. EXPOSURE: "Have you traveled to a foreign country recently?" "Have you been exposed to anyone with diarrhea?" "Could you have eaten any food that was spoiled?"     no 10. OTHER SYMPTOMS: "Do you have any other symptoms?" (e.g., fever, blood in stool)       no 11. PREGNANCY: "Is there any chance you are pregnant?" "When was your last menstrual period?"       n/a  Protocols used: DIARRHEA-A-AH

## 2018-04-01 LAB — HM DIABETES EYE EXAM

## 2018-04-03 ENCOUNTER — Ambulatory Visit: Payer: Medicare Other | Admitting: Internal Medicine

## 2018-04-09 ENCOUNTER — Encounter: Payer: Self-pay | Admitting: Internal Medicine

## 2018-04-09 ENCOUNTER — Ambulatory Visit (INDEPENDENT_AMBULATORY_CARE_PROVIDER_SITE_OTHER): Payer: Medicare Other | Admitting: Internal Medicine

## 2018-04-09 VITALS — BP 134/78 | HR 71 | Temp 98.0°F | Ht 65.25 in | Wt 165.0 lb

## 2018-04-09 DIAGNOSIS — Z0000 Encounter for general adult medical examination without abnormal findings: Secondary | ICD-10-CM | POA: Diagnosis not present

## 2018-04-09 DIAGNOSIS — E538 Deficiency of other specified B group vitamins: Secondary | ICD-10-CM

## 2018-04-09 DIAGNOSIS — E1140 Type 2 diabetes mellitus with diabetic neuropathy, unspecified: Secondary | ICD-10-CM

## 2018-04-09 DIAGNOSIS — Z7189 Other specified counseling: Secondary | ICD-10-CM | POA: Diagnosis not present

## 2018-04-09 DIAGNOSIS — I10 Essential (primary) hypertension: Secondary | ICD-10-CM | POA: Diagnosis not present

## 2018-04-09 DIAGNOSIS — E785 Hyperlipidemia, unspecified: Secondary | ICD-10-CM

## 2018-04-09 DIAGNOSIS — M3320 Polymyositis, organ involvement unspecified: Secondary | ICD-10-CM

## 2018-04-09 DIAGNOSIS — Z794 Long term (current) use of insulin: Secondary | ICD-10-CM | POA: Diagnosis not present

## 2018-04-09 LAB — LIPID PANEL
Cholesterol: 153 mg/dL (ref 0–200)
HDL: 51.1 mg/dL (ref >39.00)
LDL Cholesterol: 87 mg/dL (ref 0–99)
NONHDL: 102.31
Total CHOL/HDL Ratio: 3
Triglycerides: 76 mg/dL (ref 0.0–149.0)
VLDL: 15.2 mg/dL (ref 0.0–40.0)

## 2018-04-09 LAB — VITAMIN B12: Vitamin B-12: 251 pg/mL (ref 211–911)

## 2018-04-09 LAB — HM DIABETES FOOT EXAM

## 2018-04-09 MED ORDER — LANTUS 100 UNIT/ML ~~LOC~~ SOLN: 10.0000 [IU] | Freq: Every day | SUBCUTANEOUS | 11 refills | Status: DC

## 2018-04-09 NOTE — Assessment & Plan Note (Signed)
Doing well on Rx Prednisone weaned further Sees Dr [name]

## 2018-04-09 NOTE — Assessment & Plan Note (Signed)
Has been low without Rx and with myositis--should not have statin

## 2018-04-09 NOTE — Patient Instructions (Signed)
Please restart the lantus at 10 units daily. If your fasting sugars remain over 130 regularly after 2 weeks, increase up to 15 units. Continue the metformin 1000mg  twice a day

## 2018-04-09 NOTE — Assessment & Plan Note (Signed)
See social history 
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
## 2018-04-09 NOTE — Assessment & Plan Note (Signed)
Early neuropathy changes--- diabetes vs B12 Will restart the lantus since sugars up again

## 2018-04-09 NOTE — Progress Notes (Signed)
Hearing Screening   Method: Audiometry   125Hz  250Hz  500Hz  1000Hz  2000Hz  3000Hz  4000Hz  6000Hz  8000Hz   Right ear:   40 40 25  0    Left ear:   25 0 25  0    Vision Screening Comments: April 2019

## 2018-04-09 NOTE — Assessment & Plan Note (Signed)
BP Readings from Last 3 Encounters:  04/09/18 134/78  02/23/18 (!) 172/83  01/30/18 (!) 159/82   Good control now No changes

## 2018-04-09 NOTE — Assessment & Plan Note (Signed)
Has been on oral therapy Consider change to sublingual or IM if still low

## 2018-04-09 NOTE — Assessment & Plan Note (Signed)
I have personally reviewed the Medicare Annual Wellness questionnaire and have noted 1. The patient's medical and social history 2. Their use of alcohol, tobacco or illicit drugs 3. Their current medications and supplements 4. The patient's functional ability including ADL's, fall risks, home safety risks and hearing or visual             impairment. 5. Diet and physical activities 6. Evidence for depression or mood disorders  The patients weight, height, BMI and visual acuity have been recorded in the chart I have made referrals, counseling and provided education to the patient based review of the above and I have provided the pt with a written personalized care plan for preventive services.  I have provided you with a copy of your personalized plan for preventive services. Please take the time to review along with your updated medication list.  Yearly flu vaccine Consider shingrix Discussed exercise Colon due 2027 Mammogram due later this year

## 2018-04-25 ENCOUNTER — Telehealth: Payer: Self-pay | Admitting: *Deleted

## 2018-04-28 ENCOUNTER — Other Ambulatory Visit: Payer: Self-pay | Admitting: *Deleted

## 2018-04-29 ENCOUNTER — Encounter: Payer: Self-pay | Admitting: Oncology

## 2018-04-29 ENCOUNTER — Inpatient Hospital Stay: Payer: Medicare Other

## 2018-04-29 ENCOUNTER — Inpatient Hospital Stay: Payer: Medicare Other | Attending: Oncology | Admitting: Oncology

## 2018-04-29 VITALS — BP 176/77 | HR 76 | Temp 97.0°F | Resp 18 | Ht 65.25 in | Wt 166.4 lb

## 2018-04-29 DIAGNOSIS — I10 Essential (primary) hypertension: Secondary | ICD-10-CM | POA: Diagnosis not present

## 2018-04-29 DIAGNOSIS — Z794 Long term (current) use of insulin: Secondary | ICD-10-CM | POA: Diagnosis not present

## 2018-04-29 DIAGNOSIS — E538 Deficiency of other specified B group vitamins: Secondary | ICD-10-CM

## 2018-04-29 DIAGNOSIS — M3320 Polymyositis, organ involvement unspecified: Secondary | ICD-10-CM

## 2018-04-29 DIAGNOSIS — D649 Anemia, unspecified: Secondary | ICD-10-CM

## 2018-04-29 DIAGNOSIS — E119 Type 2 diabetes mellitus without complications: Secondary | ICD-10-CM | POA: Diagnosis not present

## 2018-04-29 DIAGNOSIS — M3329 Polymyositis with other organ involvement: Secondary | ICD-10-CM

## 2018-04-29 DIAGNOSIS — Z79899 Other long term (current) drug therapy: Secondary | ICD-10-CM

## 2018-04-29 LAB — COMPREHENSIVE METABOLIC PANEL
ALK PHOS: 40 U/L (ref 38–126)
ALT: 111 U/L — ABNORMAL HIGH (ref 14–54)
ANION GAP: 9 (ref 5–15)
AST: 89 U/L — ABNORMAL HIGH (ref 15–41)
Albumin: 3.7 g/dL (ref 3.5–5.0)
BILIRUBIN TOTAL: 1.1 mg/dL (ref 0.3–1.2)
BUN: 16 mg/dL (ref 6–20)
CO2: 26 mmol/L (ref 22–32)
CREATININE: 0.59 mg/dL (ref 0.44–1.00)
Calcium: 9.1 mg/dL (ref 8.9–10.3)
Chloride: 105 mmol/L (ref 101–111)
Glucose, Bld: 177 mg/dL — ABNORMAL HIGH (ref 65–99)
Potassium: 3.8 mmol/L (ref 3.5–5.1)
SODIUM: 140 mmol/L (ref 135–145)
TOTAL PROTEIN: 6.5 g/dL (ref 6.5–8.1)

## 2018-04-29 LAB — CBC WITH DIFFERENTIAL/PLATELET
Basophils Absolute: 0 10*3/uL (ref 0–0.1)
Basophils Relative: 1 %
EOS ABS: 0.1 10*3/uL (ref 0–0.7)
Eosinophils Relative: 1 %
HCT: 29.5 % — ABNORMAL LOW (ref 35.0–47.0)
HEMOGLOBIN: 10 g/dL — AB (ref 12.0–16.0)
LYMPHS ABS: 1 10*3/uL (ref 1.0–3.6)
LYMPHS PCT: 22 %
MCH: 33.3 pg (ref 26.0–34.0)
MCHC: 33.8 g/dL (ref 32.0–36.0)
MCV: 98.3 fL (ref 80.0–100.0)
MONOS PCT: 6 %
Monocytes Absolute: 0.2 10*3/uL (ref 0.2–0.9)
NEUTROS PCT: 70 %
Neutro Abs: 3.2 10*3/uL (ref 1.4–6.5)
Platelets: 228 10*3/uL (ref 150–440)
RBC: 3.01 MIL/uL — ABNORMAL LOW (ref 3.80–5.20)
RDW: 18.2 % — ABNORMAL HIGH (ref 11.5–14.5)
WBC: 4.4 10*3/uL (ref 3.6–11.0)

## 2018-04-29 NOTE — Progress Notes (Signed)
No new changes noted today 
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
## 2018-05-02 ENCOUNTER — Inpatient Hospital Stay: Payer: Medicare Other

## 2018-05-02 VITALS — BP 162/74 | HR 68 | Temp 97.0°F | Resp 18

## 2018-05-02 DIAGNOSIS — M3320 Polymyositis, organ involvement unspecified: Secondary | ICD-10-CM | POA: Diagnosis not present

## 2018-05-02 MED ORDER — DIPHENHYDRAMINE HCL 25 MG PO TABS
25.0000 mg | ORAL_TABLET | Freq: Once | ORAL | Status: AC
  Administered 2018-05-02: 25 mg via ORAL
  Filled 2018-05-02: qty 1

## 2018-05-02 MED ORDER — DIPHENHYDRAMINE HCL 25 MG PO CAPS
ORAL_CAPSULE | ORAL | Status: AC
  Filled 2018-05-02: qty 1

## 2018-05-02 MED ORDER — SODIUM CHLORIDE 0.9 % IV SOLN
Freq: Once | INTRAVENOUS | Status: AC
  Administered 2018-05-02: 09:00:00 via INTRAVENOUS
  Filled 2018-05-02: qty 1000

## 2018-05-02 MED ORDER — ACETAMINOPHEN 325 MG PO TABS
650.0000 mg | ORAL_TABLET | Freq: Once | ORAL | Status: AC
  Administered 2018-05-02: 650 mg via ORAL
  Filled 2018-05-02: qty 2

## 2018-05-02 MED ORDER — IMMUNE GLOBULIN (HUMAN) 5 GM/50ML IV SOLN
1.0000 g/kg | INTRAVENOUS | Status: DC
  Administered 2018-05-02: 75 g via INTRAVENOUS
  Filled 2018-05-02 (×3): qty 50

## 2018-05-02 MED ORDER — METHYLPREDNISOLONE SODIUM SUCC 125 MG IJ SOLR
40.0000 mg | INTRAMUSCULAR | Status: DC
  Administered 2018-05-02: 40 mg via INTRAVENOUS
  Filled 2018-05-02: qty 2

## 2018-05-02 NOTE — Patient Instructions (Signed)

## 2018-05-03 ENCOUNTER — Inpatient Hospital Stay: Payer: Medicare Other

## 2018-05-03 VITALS — BP 168/80 | HR 79 | Temp 97.3°F | Resp 18

## 2018-05-03 DIAGNOSIS — M3320 Polymyositis, organ involvement unspecified: Secondary | ICD-10-CM | POA: Diagnosis not present

## 2018-05-03 MED ORDER — METHYLPREDNISOLONE SODIUM SUCC 125 MG IJ SOLR
40.0000 mg | INTRAMUSCULAR | Status: DC
  Administered 2018-05-03: 40 mg via INTRAVENOUS
  Filled 2018-05-03: qty 2

## 2018-05-03 MED ORDER — DIPHENHYDRAMINE HCL 25 MG PO CAPS
ORAL_CAPSULE | ORAL | Status: AC
  Filled 2018-05-03: qty 1

## 2018-05-03 MED ORDER — ACETAMINOPHEN 325 MG PO TABS
650.0000 mg | ORAL_TABLET | Freq: Once | ORAL | Status: AC
  Administered 2018-05-03: 650 mg via ORAL
  Filled 2018-05-03: qty 2

## 2018-05-03 MED ORDER — IMMUNE GLOBULIN (HUMAN) 5 GM/50ML IV SOLN
1.0000 g/kg | INTRAVENOUS | Status: DC
  Administered 2018-05-03: 75 g via INTRAVENOUS
  Filled 2018-05-03: qty 50

## 2018-05-03 MED ORDER — DIPHENHYDRAMINE HCL 25 MG PO TABS
25.0000 mg | ORAL_TABLET | Freq: Once | ORAL | Status: AC
  Administered 2018-05-03: 25 mg via ORAL
  Filled 2018-05-03: qty 1

## 2018-05-30 ENCOUNTER — Telehealth: Payer: Self-pay

## 2018-05-30 NOTE — Telephone Encounter (Signed)
Copied from [place] [id number]. Topic: General - Other >> May 30, 2018  1:02 PM Scherrie Gerlach wrote: Reason for CRM: pt states she was instructed to call Dr Silvio Pate nurse back and advise on her insulin and how it is doing. Pt declined to give any other information

## 2018-05-30 NOTE — Telephone Encounter (Signed)
Yes---her A1c was excellent earlier this year and a lot of her elevated sugars were from the prednisone

## 2018-05-30 NOTE — Telephone Encounter (Signed)
She stopped taking Lantus sometime in May. She takes 2 Metformin 1000 mg daily.  She said her FBS has been 103, 93, and similar since stopping the Lantus. Is it okay to stay off of Lantus.

## 2018-05-30 NOTE — Telephone Encounter (Signed)
Spoke to pt. Took Lantus off of her med list.

## 2018-07-10 ENCOUNTER — Encounter: Payer: Self-pay | Admitting: Internal Medicine

## 2018-07-10 ENCOUNTER — Telehealth: Payer: Self-pay

## 2018-07-10 DIAGNOSIS — Z0279 Encounter for issue of other medical certificate: Secondary | ICD-10-CM

## 2018-07-10 NOTE — Telephone Encounter (Signed)
Patient notified letter is ready for pick up and $20 charge.

## 2018-07-10 NOTE — Telephone Encounter (Signed)
Copied from [place] [id number]. Topic: Inquiry >> Jul 10, 2018 10:58 AM Mylinda Latina, NT wrote: Reason for CRM: patient called and stats she would like Dr. Silvio Pate to write a letter so she can not attend Jury duty. Patient states you can call her is this letter can be written CB# [phone]

## 2018-07-10 NOTE — Telephone Encounter (Signed)
Letter done $20 charge 

## 2018-07-10 NOTE — Telephone Encounter (Signed)
I spoke with pt; last annual 04/09/18; pt request letter stating pt is not able to attend Jury duty; pt does not like to drive in city and pt has problems with muscle pain.jury duty is scheduled for 09/18/18. Pt request cb when letter is ready for pick up.

## 2018-07-29 ENCOUNTER — Inpatient Hospital Stay: Payer: Medicare Other

## 2018-07-29 ENCOUNTER — Inpatient Hospital Stay: Payer: Medicare Other | Attending: Oncology | Admitting: Oncology

## 2018-07-29 ENCOUNTER — Encounter: Payer: Self-pay | Admitting: Oncology

## 2018-07-29 VITALS — BP 160/74 | HR 66 | Temp 96.8°F | Resp 18

## 2018-07-29 VITALS — BP 145/77 | HR 73 | Temp 97.2°F | Resp 18 | Ht 65.25 in | Wt 172.2 lb

## 2018-07-29 DIAGNOSIS — E119 Type 2 diabetes mellitus without complications: Secondary | ICD-10-CM | POA: Diagnosis not present

## 2018-07-29 DIAGNOSIS — Z87891 Personal history of nicotine dependence: Secondary | ICD-10-CM

## 2018-07-29 DIAGNOSIS — Z809 Family history of malignant neoplasm, unspecified: Secondary | ICD-10-CM | POA: Diagnosis not present

## 2018-07-29 DIAGNOSIS — I10 Essential (primary) hypertension: Secondary | ICD-10-CM | POA: Diagnosis not present

## 2018-07-29 DIAGNOSIS — M3320 Polymyositis, organ involvement unspecified: Secondary | ICD-10-CM | POA: Diagnosis not present

## 2018-07-29 DIAGNOSIS — Z79899 Other long term (current) drug therapy: Secondary | ICD-10-CM | POA: Diagnosis not present

## 2018-07-29 MED ORDER — DEXTROSE 5 % IV SOLN
Freq: Once | INTRAVENOUS | Status: AC
  Administered 2018-07-29: 09:00:00 via INTRAVENOUS
  Filled 2018-07-29: qty 1000

## 2018-07-29 MED ORDER — IMMUNE GLOBULIN (HUMAN) 20 GM/200ML IV SOLN
75.0000 g | INTRAVENOUS | Status: DC
  Administered 2018-07-29: 75 g via INTRAVENOUS
  Filled 2018-07-29 (×2): qty 750
  Filled 2018-07-29: qty 600
  Filled 2018-07-29 (×3): qty 750

## 2018-07-29 MED ORDER — METHYLPREDNISOLONE SODIUM SUCC 125 MG IJ SOLR
40.0000 mg | Freq: Every day | INTRAMUSCULAR | Status: DC
  Administered 2018-07-29: 40 mg via INTRAVENOUS

## 2018-07-29 MED ORDER — DIPHENHYDRAMINE HCL 25 MG PO CAPS
25.0000 mg | ORAL_CAPSULE | Freq: Every day | ORAL | Status: DC
  Administered 2018-07-29: 25 mg via ORAL

## 2018-07-29 MED ORDER — ACETAMINOPHEN 325 MG PO TABS
650.0000 mg | ORAL_TABLET | Freq: Every day | ORAL | Status: DC
  Administered 2018-07-29: 650 mg via ORAL

## 2018-07-29 NOTE — Patient Instructions (Signed)

## 2018-07-29 NOTE — Progress Notes (Signed)
Pt here to get IVIG, per Dr. [name] that if ck were elevated then pt would need more IVIG.

## 2018-07-29 NOTE — Progress Notes (Signed)
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 She is currently on Imuran for the same. Her past medical history is also significant for diabetes for which insulin and metformin and hypertension. She is also currently on steroids for her myopathy and is currently on 20 mg. Patient had biopsy of the left deltoid in June 2018 which showed immune mediated inflammatory myopathy.Plan was to proceed with IVIG if her CK kept going and her recent CT was noted to be 2743 on 11/08/2017 higher than 2405 in October 2018. Patient had a CT abdomen done in June 2018 which did not reveal any acute findings in chest abdomen or pelvis. Recent blood work from 11/08/2017 showed white count of 7.7, H&H of 10.3/32.6 with an MCV of 99.1 and a platelet count of 332. She was found to have b12 deficiency and is currently on oral B12.    [name] per rheumatology recs. She has received 3 doses of IVIG so far  Interval history- she feels well. Denies any joint pain, weakness or skin rash  ECOG PS- 1 Pain scale- 0  Review of systems- Review of Systems  Constitutional: Negative for chills, fever, malaise/fatigue and weight loss.  HENT: Negative for congestion, ear discharge and nosebleeds.   Eyes: Negative for blurred vision.  Respiratory: Negative for cough, hemoptysis, sputum production, shortness of breath and wheezing.   Cardiovascular: Negative for chest pain, palpitations, orthopnea and claudication.    Gastrointestinal: Negative for abdominal pain, blood in stool, constipation, diarrhea, heartburn, melena, nausea and vomiting.  Genitourinary: Negative for dysuria, flank pain, frequency, hematuria and urgency.  Musculoskeletal: Negative for back pain, joint pain and myalgias.  Skin: Negative for rash.  Neurological: Negative for dizziness, tingling, focal weakness, seizures, weakness and headaches.  Endo/Heme/Allergies: Does not bruise/bleed easily.  Psychiatric/Behavioral: Negative for depression and suicidal ideas. The patient does not have insomnia.      Allergies  Allergen Reactions  . Atorvastatin Other (See Comments)    myalgias  . Pravastatin Other (See Comments)    Muscle pain     Past Medical History:  Diagnosis Date  . Allergy   . Asthma   . Diabetes mellitus   . Hyperlipidemia   . Hypertension   . Neuromuscular disorder (HCC)    polymyositis  . Osteoarthritis   . Vitamin B12 deficiency      Past Surgical History:  Procedure Laterality Date  . COLONOSCOPY WITH PROPOFOL N/A 03/07/2016   Procedure: COLONOSCOPY WITH PROPOFOL;  Surgeon: Lollie Sails, MD;  Location: [facility] ENDOSCOPY;  Service: Endoscopy;  Laterality: N/A;    Social History   Socioeconomic History  . Marital status: Widowed    Spouse name: Not on file  . Number of children: 2  . Years of education: Not on file  . Highest education level: Not on file  Occupational History  . Occupation: Retired as Training and development officer at [employer]:    Social Needs  . Financial resource strain: Not on file  . Food insecurity:  Worry: Not on file    Inability: Not on file  . Transportation needs:    Medical: Not on file    Non-medical: Not on file  Tobacco Use  . Smoking status: Former Research scientist (life sciences)  . Smokeless tobacco: Never Used  . Tobacco comment: age 45 when stopped smoking  Substance and Sexual Activity  . Alcohol use: No  . Drug use: No  . Sexual activity: Not on file  Lifestyle   . Physical activity:    Days per week: Not on file    Minutes per session: Not on file  . Stress: Not on file  Relationships  . Social connections:    Talks on phone: Not on file    Gets together: Not on file    Attends religious service: Not on file    Active member of club or organization: Not on file    Attends meetings of clubs or organizations: Not on file    Relationship status: Not on file  . Intimate partner violence:    Fear of current or ex partner: Not on file    Emotionally abused: Not on file    Physically abused: Not on file    Forced sexual activity: Not on file  Other Topics Concern  . Not on file  Social History Narrative   No living will   Son and daughter should make health care decisions for her   Would accept resuscitation   Not sure about tube feeds    Family History  Problem Relation Age of Onset  . Heart disease Mother   . Cancer Father      Current Outpatient Medications:  .  ACCU-CHEK FASTCLIX LANCETS MISC, Use to test blood sugar twice daily dx: 250.02, Disp: 100 each, Rfl: 3 .  ACCU-CHEK SMARTVIEW test strip, CHECK BLOOD SUGAR TWICE A  DAY AND AS DIRECTED, Disp: 200 each, Rfl: 3 .  albuterol (PROVENTIL HFA;VENTOLIN HFA) 108 (90 BASE) MCG/ACT inhaler, Inhale 2 puffs into the lungs every 6 (six) hours as needed for wheezing or shortness of breath., Disp: 2 Inhaler, Rfl: 3 .  Alcohol Swabs (B-D SINGLE USE SWABS REGULAR) PADS, Use to test blood sugar once daily dx: 250.00, Disp: 100 each, Rfl: 3 .  Blood Pressure Monitoring (BLOOD PRESSURE CUFF) MISC, Measure blood pressure once daily as directed., Disp: 1 each, Rfl: 0 .  Cholecalciferol (VITAMIN D) 1000 UNITS capsule, Take 1,000 Units by mouth daily.  , Disp: , Rfl:  .  fluticasone (FLOVENT HFA) 110 MCG/ACT inhaler, Inhale 2 puffs into the lungs daily., Disp: 12 g, Rfl: 3 .  folic acid (FOLVITE) 1 MG tablet, Take 1 mg by mouth daily., Disp: , Rfl:  .  lisinopril (PRINIVIL,ZESTRIL) 10 MG tablet,  take 1 tablet by mouth once daily, Disp: 30 tablet, Rfl: 11 .  metFORMIN (GLUCOPHAGE) 1000 MG tablet, take 1 tablet by mouth twice a day, Disp: 60 tablet, Rfl: 11 .  Multiple Vitamin (MULTIVITAMIN) tablet, Take 1 tablet by mouth daily.  , Disp: , Rfl:  .  predniSONE (DELTASONE) 5 MG tablet, Take 5 mg by mouth daily with breakfast., Disp: , Rfl:  .  alendronate (FOSAMAX) 70 MG tablet, take 1 tablet by mouth every 7 days WITH A FULL GLASS OF WATER; DO NOT LIE DOWN FOR THE NEXT 30 MINUTES (Patient not taking: Reported on 04/29/2018), Disp: 4 tablet, Rfl: 11 .  azaTHIOprine (IMURAN) 50 MG tablet, take 3.5 tablets by mouth once daily, Disp: 105 tablet, Rfl: 0 .  tiZANidine (ZANAFLEX) 4 MG tablet, Take 1 tablet by mouth at bedtime as needed., Disp: , Rfl: 0 No current facility-administered medications for this visit.   Facility-Administered Medications Ordered in Other Visits:  .  acetaminophen (TYLENOL) tablet 650 mg, 650 mg, Oral, Daily, Sindy Guadeloupe, MD, 650 mg at 07/29/18 0910 .  diphenhydrAMINE (BENADRYL) capsule 25 mg, 25 mg, Oral, Daily, Sindy Guadeloupe, MD, 25 mg at 07/29/18 0911 .  Immune Globulin 10% (PRIVIGEN) IV infusion 75 g, 75 g, Intravenous, Q24 Hr x 2, Sindy Guadeloupe, MD, 75 g at 07/29/18 1013 .  methylPREDNISolone sodium succinate (SOLU-MEDROL) 125 mg/2 mL injection 40 mg, 40 mg, Intravenous, Daily, Sindy Guadeloupe, MD, 40 mg at 07/29/18 0910  Physical exam:  Vitals:   07/29/18 0842  BP: (!) 145/77  Pulse: 73  Resp: 18  Temp: (!) 97.2 F (36.2 C)  TempSrc: Tympanic  Weight: 172 lb 2.9 oz (78.1 kg)  Height: 5' 5.25" (1.657 m)   Physical Exam  Constitutional: She is oriented to person, place, and time. She appears well-developed and well-nourished.  HENT:  Head: Normocephalic and atraumatic.  Eyes: Pupils are equal, round, and reactive to light. EOM are normal.  Neck: Normal range of motion.  Cardiovascular: Normal rate, regular rhythm and normal heart sounds.   Pulmonary/Chest: Effort normal and breath sounds normal.  Abdominal: Soft. Bowel sounds are normal.  Neurological: She is alert and oriented to person, place, and time.  Skin: Skin is warm and dry.     CMP Latest Ref Rng & Units 04/29/2018  Glucose 65 - 99 mg/dL 177(H)  BUN 6 - 20 mg/dL 16  Creatinine 0.44 - 1.00 mg/dL 0.59  Sodium 135 - 145 mmol/L 140  Potassium 3.5 - 5.1 mmol/L 3.8  Chloride 101 - 111 mmol/L 105  CO2 22 - 32 mmol/L 26  Calcium 8.9 - 10.3 mg/dL 9.1  Total Protein 6.5 - 8.1 g/dL 6.5  Total Bilirubin 0.3 - 1.2 mg/dL 1.1  Alkaline Phos 38 - 126 U/L 40  AST 15 - 41 U/L 89(H)  ALT 14 - 54 U/L 111(H)   CBC Latest Ref Rng & Units 04/29/2018  WBC 3.6 - 11.0 K/uL 4.4  Hemoglobin 12.0 - 16.0 g/dL 10.0(L)  Hematocrit 35.0 - 47.0 % 29.5(L)  Platelets 150 - 440 K/uL 228    Assessment and plan- Patient is a 72 y.o. female  history of biopsy-proven autoimmune myositis. She is here for cycle 5 of IVIG   She recently had labs done last week which showed chronic stable anemia with hb of 10. Kidney functions are normal. She will proceed with IVIG 1gm/kg today and tomorrow with pre meds including solumedrol. She will get another cycle next month. I will see her back in 2 months or later based on rheumatology recommendations  Visit Diagnosis 1. Polymyositis, organ involvement unspecified ([place])   2. Long-term current use of intravenous immunoglobulin (IVIG)      Dr. Randa Evens, MD, MPH [facility] [place] [phone] 07/29/2018 1:02 PM

## 2018-07-30 ENCOUNTER — Inpatient Hospital Stay: Payer: Medicare Other

## 2018-07-30 VITALS — BP 156/70 | HR 70 | Temp 96.9°F | Resp 17

## 2018-07-30 DIAGNOSIS — M3320 Polymyositis, organ involvement unspecified: Secondary | ICD-10-CM | POA: Diagnosis not present

## 2018-07-30 MED ORDER — DIPHENHYDRAMINE HCL 25 MG PO TABS: 25.0000 mg | ORAL_TABLET | Freq: Every day | ORAL | Status: DC

## 2018-07-30 MED ORDER — ACETAMINOPHEN 325 MG PO TABS
650.0000 mg | ORAL_TABLET | Freq: Every day | ORAL | Status: DC
  Administered 2018-07-30: 650 mg via ORAL
  Filled 2018-07-30: qty 2

## 2018-07-30 MED ORDER — METHYLPREDNISOLONE SODIUM SUCC 125 MG IJ SOLR: 40.0000 mg | INTRAMUSCULAR | Status: DC

## 2018-07-30 MED ORDER — DIPHENHYDRAMINE HCL 25 MG PO CAPS
ORAL_CAPSULE | ORAL | Status: AC
  Filled 2018-07-30: qty 1

## 2018-07-30 MED ORDER — IMMUNE GLOBULIN (HUMAN) 20 GM/200ML IV SOLN
75.0000 g | INTRAVENOUS | Status: DC
  Administered 2018-07-30: 75 g via INTRAVENOUS
  Filled 2018-07-30 (×2): qty 750

## 2018-07-30 MED ORDER — ACETAMINOPHEN 325 MG PO TABS: 650.0000 mg | ORAL_TABLET | Freq: Every day | ORAL | Status: DC

## 2018-07-30 MED ORDER — DIPHENHYDRAMINE HCL 25 MG PO CAPS
25.0000 mg | ORAL_CAPSULE | Freq: Every day | ORAL | Status: DC
  Administered 2018-07-30: 25 mg via ORAL
  Filled 2018-07-30: qty 1

## 2018-07-30 MED ORDER — DEXTROSE 5 % IV SOLN
Freq: Once | INTRAVENOUS | Status: AC
  Administered 2018-07-30: 09:00:00 via INTRAVENOUS
  Filled 2018-07-30: qty 1000

## 2018-07-30 MED ORDER — IMMUNE GLOBULIN (HUMAN) 20 GM/200ML IV SOLN: 1.0000 g/kg | INTRAVENOUS | Status: DC

## 2018-07-30 MED ORDER — METHYLPREDNISOLONE SODIUM SUCC 125 MG IJ SOLR
40.0000 mg | Freq: Every day | INTRAMUSCULAR | Status: DC
  Administered 2018-07-30: 40 mg via INTRAVENOUS
  Filled 2018-07-30: qty 2

## 2018-08-01 ENCOUNTER — Other Ambulatory Visit: Payer: Self-pay | Admitting: Internal Medicine

## 2018-08-13 ENCOUNTER — Other Ambulatory Visit: Payer: Self-pay | Admitting: Internal Medicine

## 2018-08-15 ENCOUNTER — Other Ambulatory Visit: Payer: Self-pay | Admitting: Internal Medicine

## 2018-08-15 DIAGNOSIS — I10 Essential (primary) hypertension: Secondary | ICD-10-CM

## 2018-08-19 ENCOUNTER — Other Ambulatory Visit: Payer: Self-pay | Admitting: Oncology

## 2018-08-27 ENCOUNTER — Inpatient Hospital Stay: Payer: Medicare Other | Attending: Oncology

## 2018-08-27 VITALS — BP 168/75 | HR 72 | Temp 96.3°F | Resp 16

## 2018-08-27 DIAGNOSIS — M3320 Polymyositis, organ involvement unspecified: Secondary | ICD-10-CM | POA: Diagnosis present

## 2018-08-27 MED ORDER — DIPHENHYDRAMINE HCL 25 MG PO CAPS
ORAL_CAPSULE | ORAL | Status: AC
  Filled 2018-08-27: qty 1

## 2018-08-27 MED ORDER — METHYLPREDNISOLONE SODIUM SUCC 125 MG IJ SOLR
40.0000 mg | INTRAMUSCULAR | Status: DC
  Administered 2018-08-27: 40 mg via INTRAVENOUS
  Filled 2018-08-27: qty 2

## 2018-08-27 MED ORDER — DIPHENHYDRAMINE HCL 25 MG PO CAPS
25.0000 mg | ORAL_CAPSULE | ORAL | Status: DC
  Administered 2018-08-27: 25 mg via ORAL
  Filled 2018-08-27: qty 1

## 2018-08-27 MED ORDER — IMMUNE GLOBULIN (HUMAN) 20 GM/200ML IV SOLN
75.0000 g | INTRAVENOUS | Status: DC
  Administered 2018-08-27: 75 g via INTRAVENOUS
  Filled 2018-08-27 (×2): qty 750

## 2018-08-27 MED ORDER — SODIUM CHLORIDE 0.9 % IV SOLN
Freq: Once | INTRAVENOUS | Status: AC
  Administered 2018-08-27: 09:00:00 via INTRAVENOUS
  Filled 2018-08-27: qty 250

## 2018-08-27 MED ORDER — ACETAMINOPHEN 325 MG PO TABS
650.0000 mg | ORAL_TABLET | ORAL | Status: DC
  Administered 2018-08-27: 650 mg via ORAL
  Filled 2018-08-27: qty 2

## 2018-08-27 NOTE — Patient Instructions (Signed)

## 2018-08-28 ENCOUNTER — Other Ambulatory Visit: Payer: Self-pay | Admitting: Oncology

## 2018-08-28 ENCOUNTER — Inpatient Hospital Stay: Payer: Medicare Other

## 2018-08-28 VITALS — BP 167/78 | HR 78 | Temp 96.2°F | Resp 18

## 2018-08-28 DIAGNOSIS — M3320 Polymyositis, organ involvement unspecified: Secondary | ICD-10-CM

## 2018-08-28 MED ORDER — DIPHENHYDRAMINE HCL 25 MG PO CAPS
25.0000 mg | ORAL_CAPSULE | ORAL | Status: DC
  Administered 2018-08-28: 25 mg via ORAL
  Filled 2018-08-28: qty 1

## 2018-08-28 MED ORDER — DIPHENHYDRAMINE HCL 25 MG PO CAPS
ORAL_CAPSULE | ORAL | Status: AC
  Filled 2018-08-28: qty 1

## 2018-08-28 MED ORDER — SODIUM CHLORIDE 0.9 % IV SOLN
Freq: Once | INTRAVENOUS | Status: DC
  Filled 2018-08-28: qty 250

## 2018-08-28 MED ORDER — ACETAMINOPHEN 325 MG PO TABS
650.0000 mg | ORAL_TABLET | ORAL | Status: DC
  Administered 2018-08-28: 650 mg via ORAL
  Filled 2018-08-28: qty 2

## 2018-08-28 MED ORDER — IMMUNE GLOBULIN (HUMAN) 5 GM/50ML IV SOLN
75.0000 g | Freq: Once | INTRAVENOUS | Status: AC
  Administered 2018-08-28: 75 g via INTRAVENOUS
  Filled 2018-08-28 (×2): qty 50

## 2018-08-28 MED ORDER — DEXTROSE 5 % IV SOLN
Freq: Once | INTRAVENOUS | Status: AC
  Administered 2018-08-28: 09:00:00 via INTRAVENOUS
  Filled 2018-08-28: qty 250

## 2018-08-28 MED ORDER — DEXTROSE 5 % IV SOLN
Freq: Once | INTRAVENOUS | Status: DC
  Filled 2018-08-28: qty 250

## 2018-08-28 MED ORDER — METHYLPREDNISOLONE SODIUM SUCC 125 MG IJ SOLR
40.0000 mg | INTRAMUSCULAR | Status: DC
  Administered 2018-08-28: 40 mg via INTRAVENOUS
  Filled 2018-08-28: qty 2
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
## 2018-09-23 ENCOUNTER — Inpatient Hospital Stay: Payer: Medicare Other

## 2018-09-23 ENCOUNTER — Encounter: Payer: Self-pay | Admitting: Oncology

## 2018-09-23 ENCOUNTER — Inpatient Hospital Stay (HOSPITAL_BASED_OUTPATIENT_CLINIC_OR_DEPARTMENT_OTHER): Payer: Medicare Other | Admitting: Oncology

## 2018-09-23 VITALS — BP 154/82 | HR 77 | Temp 96.2°F | Resp 18

## 2018-09-23 VITALS — BP 131/80 | HR 93 | Temp 97.3°F | Resp 18 | Ht 65.25 in | Wt 175.5 lb

## 2018-09-23 DIAGNOSIS — Z79899 Other long term (current) drug therapy: Secondary | ICD-10-CM

## 2018-09-23 DIAGNOSIS — I10 Essential (primary) hypertension: Secondary | ICD-10-CM | POA: Diagnosis not present

## 2018-09-23 DIAGNOSIS — E119 Type 2 diabetes mellitus without complications: Secondary | ICD-10-CM | POA: Diagnosis not present

## 2018-09-23 DIAGNOSIS — M3320 Polymyositis, organ involvement unspecified: Secondary | ICD-10-CM

## 2018-09-23 MED ORDER — IMMUNE GLOBULIN (HUMAN) 20 GM/200ML IV SOLN
75.0000 g | INTRAVENOUS | Status: DC
  Administered 2018-09-23: 75 g via INTRAVENOUS
  Filled 2018-09-23 (×2): qty 750

## 2018-09-23 MED ORDER — DIPHENHYDRAMINE HCL 25 MG PO CAPS
ORAL_CAPSULE | ORAL | Status: AC
  Filled 2018-09-23: qty 1

## 2018-09-23 MED ORDER — DEXTROSE 5 % IV SOLN
Freq: Once | INTRAVENOUS | Status: AC
  Administered 2018-09-23: 09:00:00 via INTRAVENOUS
  Filled 2018-09-23: qty 250

## 2018-09-23 MED ORDER — ACETAMINOPHEN 325 MG PO TABS
650.0000 mg | ORAL_TABLET | Freq: Every day | ORAL | Status: DC
  Administered 2018-09-23: 650 mg via ORAL
  Filled 2018-09-23: qty 2

## 2018-09-23 MED ORDER — DIPHENHYDRAMINE HCL 25 MG PO CAPS
25.0000 mg | ORAL_CAPSULE | Freq: Every day | ORAL | Status: DC
  Administered 2018-09-23: 25 mg via ORAL
  Filled 2018-09-23: qty 1

## 2018-09-23 MED ORDER — METHYLPREDNISOLONE SODIUM SUCC 125 MG IJ SOLR
40.0000 mg | INTRAMUSCULAR | Status: DC
  Administered 2018-09-23: 40 mg via INTRAVENOUS
  Filled 2018-09-23: qty 2

## 2018-09-23 NOTE — Progress Notes (Signed)
Hematology/Oncology Consult note [facility]  Telephone:([phone] Fax:([phone]  Patient Care Team: [name], MD as PCP - General   Name of the patient: Bridget Romero  [id number]  1946/11/16   Date of visit: 09/23/18  Diagnosis- history of polymyositis for IVIG treatment  Chief complaint/ Reason for visit-on treatment assessment prior to next cycle of IVIG  Heme/Onc history: patient is a 72 year old female who sees Dr.kernodlefor immune mediated necrotizing myopathy. She is currently on Imuran for the same. Her past medical history is also significant for diabetes for which insulin and metformin and hypertension. She is also currently on steroids for her myopathy and is currently on 20 mg. Patient had biopsy of the left deltoid in June 2018 which showed immune mediated inflammatory myopathy.Plan was to proceed with IVIG if her CK kept going and her recent CT was noted to be 2743 on 11/08/2017 higher than 2405 in October 2018. Patient had a CT abdomen done in June 2018 which did not reveal any acute findings in chest abdomen or pelvis. Recent blood work from 11/08/2017 showed white count of 7.7, H&H of 10.3/32.6 with an MCV of 99.1 and a platelet count of 332. She was found to have b12 deficiency and is currently on oral B12.    [name] per rheumatology recs  Interval history-patient sprained her back when she was lifting up something from the floor.  She reports having some back pain because of that over the last 2 days.  Denies other complaints  ECOG PS- 1 Pain scale- 0   Review of systems- Review of Systems  Constitutional: Negative for chills, fever, malaise/fatigue and weight loss.  HENT: Negative for congestion, ear discharge and nosebleeds.   Eyes: Negative for blurred vision.  Respiratory: Negative for cough, hemoptysis, sputum production, shortness of breath and wheezing.   Cardiovascular: Negative  for chest pain, palpitations, orthopnea and claudication.  Gastrointestinal: Negative for abdominal pain, blood in stool, constipation, diarrhea, heartburn, melena, nausea and vomiting.  Genitourinary: Negative for dysuria, flank pain, frequency, hematuria and urgency.  Musculoskeletal: Negative for back pain, joint pain and myalgias.  Skin: Negative for rash.  Neurological: Negative for dizziness, tingling, focal weakness, seizures, weakness and headaches.  Endo/Heme/Allergies: Does not bruise/bleed easily.  Psychiatric/Behavioral: Negative for depression and suicidal ideas. The patient does not have insomnia.       Allergies  Allergen Reactions  . Atorvastatin Other (See Comments)    myalgias  . Pravastatin Other (See Comments)    Muscle pain     Past Medical History:  Diagnosis Date  . Allergy   . Asthma   . Diabetes mellitus   . Hyperlipidemia   . Hypertension   . Neuromuscular disorder (HCC)    polymyositis  . Osteoarthritis   . Vitamin B12 deficiency      Past Surgical History:  Procedure Laterality Date  . COLONOSCOPY WITH PROPOFOL N/A 03/07/2016   Procedure: COLONOSCOPY WITH PROPOFOL;  Surgeon: Lollie Sails, MD;  Location: [facility] ENDOSCOPY;  Service: Endoscopy;  Laterality: N/A;    Social History   Socioeconomic History  . Marital status: Widowed    Spouse name: Not on file  . Number of children: 2  . Years of education: Not on file  . Highest education level: Not on file  Occupational History  . Occupation: Retired as Training and development officer at [employer]:    Social Needs  . Financial resource strain: Not  on file  . Food insecurity:    Worry: Not on file    Inability: Not on file  . Transportation needs:    Medical: Not on file    Non-medical: Not on file  Tobacco Use  . Smoking status: Former Research scientist (life sciences)  . Smokeless tobacco: Never Used  . Tobacco comment: age 7 when stopped smoking  Substance and Sexual Activity  . Alcohol use: No    . Drug use: No  . Sexual activity: Not on file  Lifestyle  . Physical activity:    Days per week: Not on file    Minutes per session: Not on file  . Stress: Not on file  Relationships  . Social connections:    Talks on phone: Not on file    Gets together: Not on file    Attends religious service: Not on file    Active member of club or organization: Not on file    Attends meetings of clubs or organizations: Not on file    Relationship status: Not on file  . Intimate partner violence:    Fear of current or ex partner: Not on file    Emotionally abused: Not on file    Physically abused: Not on file    Forced sexual activity: Not on file  Other Topics Concern  . Not on file  Social History Narrative   No living will   Son and daughter should make health care decisions for her   Would accept resuscitation   Not sure about tube feeds    Family History  Problem Relation Age of Onset  . Heart disease Mother   . Cancer Father      Current Outpatient Medications:  .  ACCU-CHEK FASTCLIX LANCETS MISC, Use to test blood sugar twice daily dx: 250.02, Disp: 100 each, Rfl: 3 .  ACCU-CHEK SMARTVIEW test strip, CHECK BLOOD SUGAR TWICE A  DAY AND AS DIRECTED, Disp: 200 each, Rfl: 3 .  albuterol (PROVENTIL HFA;VENTOLIN HFA) 108 (90 BASE) MCG/ACT inhaler, Inhale 2 puffs into the lungs every 6 (six) hours as needed for wheezing or shortness of breath., Disp: 2 Inhaler, Rfl: 3 .  Alcohol Swabs (B-D SINGLE USE SWABS REGULAR) PADS, Use to test blood sugar once daily dx: 250.00, Disp: 100 each, Rfl: 3 .  alendronate (FOSAMAX) 70 MG tablet, TAKE 1 TABLET BY MOUTH EVERY 7 DAYS WITH A FULL GLASS OF WATER, DO NOT LIE DOWN FOR THE NEXT 30 MINUTES, Disp: 12 tablet, Rfl: 3 .  azaTHIOprine (IMURAN) 50 MG tablet, take 3.5 tablets by mouth once daily, Disp: 105 tablet, Rfl: 0 .  Cholecalciferol (VITAMIN D) 1000 UNITS capsule, Take 1,000 Units by mouth daily.  , Disp: , Rfl:  .  folic acid (FOLVITE) 1 MG  tablet, Take 1 mg by mouth daily., Disp: , Rfl:  .  lisinopril (PRINIVIL,ZESTRIL) 10 MG tablet, TAKE 1 TABLET BY MOUTH EVERY DAY, Disp: 90 tablet, Rfl: 1 .  metFORMIN (GLUCOPHAGE) 1000 MG tablet, TAKE 1 TABLET BY MOUTH TWICE DAILY, Disp: 180 tablet, Rfl: 0 .  Multiple Vitamin (MULTIVITAMIN) tablet, Take 1 tablet by mouth daily.  , Disp: , Rfl:  .  predniSONE (DELTASONE) 5 MG tablet, Take 5 mg by mouth daily with breakfast., Disp: , Rfl:  .  tiZANidine (ZANAFLEX) 4 MG tablet, Take 1 tablet by mouth at bedtime as needed., Disp: , Rfl: 0 .  Blood Pressure Monitoring (BLOOD PRESSURE CUFF) MISC, Measure blood pressure once daily as directed. (Patient not taking:  Reported on 09/23/2018), Disp: 1 each, Rfl: 0 .  fluticasone (FLOVENT HFA) 110 MCG/ACT inhaler, Inhale 2 puffs into the lungs daily. (Patient not taking: Reported on 09/23/2018), Disp: 12 g, Rfl: 3 No current facility-administered medications for this visit.   Facility-Administered Medications Ordered in Other Visits:  .  acetaminophen (TYLENOL) tablet 650 mg, 650 mg, Oral, Daily, Sindy Guadeloupe, MD, 650 mg at 09/23/18 0935 .  diphenhydrAMINE (BENADRYL) capsule 25 mg, 25 mg, Oral, Daily, Sindy Guadeloupe, MD, 25 mg at 09/23/18 0934 .  Immune Globulin 10% (PRIVIGEN) IV infusion 75 g, 75 g, Intravenous, Q24 Hr x 2, Sindy Guadeloupe, MD, 75 g at 09/23/18 1003 .  methylPREDNISolone sodium succinate (SOLU-MEDROL) 125 mg/2 mL injection 40 mg, 40 mg, Intravenous, Q24H, Sindy Guadeloupe, MD, 40 mg at 09/23/18 8502  Physical exam:  Vitals:   09/23/18 0844  BP: 131/80  Pulse: 93  Resp: 18  Temp: (!) 97.3 F (36.3 C)  TempSrc: Tympanic  SpO2: 100%  Weight: 175 lb 7.8 oz (79.6 kg)  Height: 5' 5.25" (1.657 m)   Physical Exam  Constitutional: She is oriented to person, place, and time. She appears well-developed and well-nourished.  HENT:  Head: Normocephalic and atraumatic.  Eyes: Pupils are equal, round, and reactive to light. EOM are normal.  Neck:  Normal range of motion.  Cardiovascular: Normal rate, regular rhythm and normal heart sounds.  Pulmonary/Chest: Effort normal and breath sounds normal.  Abdominal: Soft. Bowel sounds are normal.  Neurological: She is alert and oriented to person, place, and time.  Skin: Skin is warm and dry.     CMP Latest Ref Rng & Units 04/29/2018  Glucose 65 - 99 mg/dL 177(H)  BUN 6 - 20 mg/dL 16  Creatinine 0.44 - 1.00 mg/dL 0.59  Sodium 135 - 145 mmol/L 140  Potassium 3.5 - 5.1 mmol/L 3.8  Chloride 101 - 111 mmol/L 105  CO2 22 - 32 mmol/L 26  Calcium 8.9 - 10.3 mg/dL 9.1  Total Protein 6.5 - 8.1 g/dL 6.5  Total Bilirubin 0.3 - 1.2 mg/dL 1.1  Alkaline Phos 38 - 126 U/L 40  AST 15 - 41 U/L 89(H)  ALT 14 - 54 U/L 111(H)   CBC Latest Ref Rng & Units 04/29/2018  WBC 3.6 - 11.0 K/uL 4.4  Hemoglobin 12.0 - 16.0 g/dL 10.0(L)  Hematocrit 35.0 - 47.0 % 29.5(L)  Platelets 150 - 440 K/uL 228      Assessment and plan- Patient is a 72 y.o. female with history of polymyositis is here to receive IVIG  I have reviewed her recent blood work from 09/06/2018 which shows normal renal functions and hemoglobin that is stable at 10.  Her white count and platelets are normal.  She is received IVIG in the past without any significant side effects.  She will receive her next dose of IVIG 1 g/kg today and tomorrow along with premeds Tylenol, Benadryl and 40 mg of Solu-Medrol.  Subsequent doses of IVIG would be as determined by rheumatology   Visit Diagnosis 1. Long-term current use of intravenous immunoglobulin (IVIG)      Dr. Randa Evens, MD, MPH [facility] at [facility] [phone] 09/23/2018 1:05 PM

## 2018-09-23 NOTE — Addendum Note (Signed)
Addended by: Randa Evens C on: 09/23/2018 02:30 PM   Modules accepted: Orders

## 2018-09-23 NOTE — Patient Instructions (Signed)

## 2018-09-24 ENCOUNTER — Inpatient Hospital Stay: Payer: Medicare Other | Attending: Oncology

## 2018-09-24 VITALS — BP 152/78 | HR 73 | Temp 96.8°F | Resp 18

## 2018-09-24 DIAGNOSIS — M3320 Polymyositis, organ involvement unspecified: Secondary | ICD-10-CM | POA: Insufficient documentation

## 2018-09-24 MED ORDER — METHYLPREDNISOLONE SODIUM SUCC 125 MG IJ SOLR
40.0000 mg | INTRAMUSCULAR | Status: DC
  Administered 2018-09-24: 40 mg via INTRAVENOUS
  Filled 2018-09-24: qty 2

## 2018-09-24 MED ORDER — IMMUNE GLOBULIN (HUMAN) 20 GM/200ML IV SOLN
75.0000 g | INTRAVENOUS | Status: DC
  Administered 2018-09-24: 75 g via INTRAVENOUS
  Filled 2018-09-24: qty 750
  Filled 2018-09-24: qty 600

## 2018-09-24 MED ORDER — DIPHENHYDRAMINE HCL 25 MG PO CAPS
25.0000 mg | ORAL_CAPSULE | Freq: Every day | ORAL | Status: DC
  Administered 2018-09-24: 25 mg via ORAL
  Filled 2018-09-24: qty 1

## 2018-09-24 MED ORDER — ACETAMINOPHEN 325 MG PO TABS
650.0000 mg | ORAL_TABLET | Freq: Every day | ORAL | Status: DC
  Administered 2018-09-24: 650 mg via ORAL
  Filled 2018-09-24: qty 2

## 2018-09-24 MED ORDER — DEXTROSE 5 % IV SOLN
Freq: Once | INTRAVENOUS | Status: AC
  Administered 2018-09-24: 09:00:00 via INTRAVENOUS
  Filled 2018-09-24: qty 250

## 2018-10-11 ENCOUNTER — Ambulatory Visit: Payer: Medicare Other | Admitting: Internal Medicine

## 2018-10-11 ENCOUNTER — Encounter: Payer: Self-pay | Admitting: Internal Medicine

## 2018-10-11 VITALS — BP 126/84 | HR 61 | Temp 97.9°F | Ht 65.0 in | Wt 177.0 lb

## 2018-10-11 DIAGNOSIS — E1140 Type 2 diabetes mellitus with diabetic neuropathy, unspecified: Secondary | ICD-10-CM

## 2018-10-11 DIAGNOSIS — M3320 Polymyositis, organ involvement unspecified: Secondary | ICD-10-CM

## 2018-10-11 DIAGNOSIS — Z794 Long term (current) use of insulin: Secondary | ICD-10-CM

## 2018-10-11 DIAGNOSIS — Z23 Encounter for immunization: Secondary | ICD-10-CM | POA: Diagnosis not present

## 2018-10-11 LAB — POCT GLYCOSYLATED HEMOGLOBIN (HGB A1C): Hemoglobin A1C: 6.2 % — AB (ref 4.0–5.6)

## 2018-10-11 NOTE — Addendum Note (Signed)
Addended by: Pilar Grammes on: 10/11/2018 09:29 AM   Modules accepted: Orders

## 2018-10-11 NOTE — Assessment & Plan Note (Signed)
Last CPK was up but symptoms are controlled Dr [name] is managing

## 2018-10-11 NOTE — Progress Notes (Signed)
Subjective:    Patient ID: Bridget Romero, female    DOB: [birth year], 72 y.o.   MRN: [id number]  HPI Here for follow up of diabetes and other chronic health conditions  Still on IVIG Azathioprine and low dose prednisone from Dr [name] Myositis has been controled  Checking sugars once or twice a day Usually 115-120 fasting No hypoglycemic reactions Ongoing neuropathy--takes the gabapentin once a day  Current Outpatient Medications on File Prior to Visit  Medication Sig Dispense Refill  . ACCU-CHEK FASTCLIX LANCETS MISC Use to test blood sugar twice daily dx: 250.02 100 each 3  . ACCU-CHEK SMARTVIEW test strip CHECK BLOOD SUGAR TWICE A  DAY AND AS DIRECTED 200 each 3  . albuterol (PROVENTIL HFA;VENTOLIN HFA) 108 (90 BASE) MCG/ACT inhaler Inhale 2 puffs into the lungs every 6 (six) hours as needed for wheezing or shortness of breath. 2 Inhaler 3  . Alcohol Swabs (B-D SINGLE USE SWABS REGULAR) PADS Use to test blood sugar once daily dx: 250.00 100 each 3  . alendronate (FOSAMAX) 70 MG tablet TAKE 1 TABLET BY MOUTH EVERY 7 DAYS WITH A FULL GLASS OF WATER, DO NOT LIE DOWN FOR THE NEXT 30 MINUTES 12 tablet 3  . azaTHIOprine (IMURAN) 50 MG tablet take 3.5 tablets by mouth once daily 105 tablet 0  . Blood Pressure Monitoring (BLOOD PRESSURE CUFF) MISC Measure blood pressure once daily as directed. 1 each 0  . Cholecalciferol (VITAMIN D) 1000 UNITS capsule Take 1,000 Units by mouth daily.      . fluticasone (FLOVENT HFA) 110 MCG/ACT inhaler Inhale 2 puffs into the lungs daily. 12 g 3  . folic acid (FOLVITE) 1 MG tablet Take 1 mg by mouth daily.    Marland Kitchen lisinopril (PRINIVIL,ZESTRIL) 10 MG tablet TAKE 1 TABLET BY MOUTH EVERY DAY 90 tablet 1  . metFORMIN (GLUCOPHAGE) 1000 MG tablet TAKE 1 TABLET BY MOUTH TWICE DAILY 180 tablet 0  . Multiple Vitamin (MULTIVITAMIN) tablet Take 1 tablet by mouth daily.      . predniSONE (DELTASONE) 5 MG tablet Take 5 mg by mouth daily with breakfast.    .  tiZANidine (ZANAFLEX) 4 MG tablet Take 1 tablet by mouth at bedtime as needed.  0   No current facility-administered medications on file prior to visit.     Allergies  Allergen Reactions  . Atorvastatin Other (See Comments)    myalgias  . Pravastatin Other (See Comments)    Muscle pain    Past Medical History:  Diagnosis Date  . Allergy   . Asthma   . Diabetes mellitus   . Hyperlipidemia   . Hypertension   . Neuromuscular disorder (HCC)    polymyositis  . Osteoarthritis   . Vitamin B12 deficiency     Past Surgical History:  Procedure Laterality Date  . COLONOSCOPY WITH PROPOFOL N/A 03/07/2016   Procedure: COLONOSCOPY WITH PROPOFOL;  Surgeon: Lollie Sails, MD;  Location: [facility] ENDOSCOPY;  Service: Endoscopy;  Laterality: N/A;    Family History  Problem Relation Age of Onset  . Heart disease Mother   . Cancer Father     Social History   Socioeconomic History  . Marital status: Widowed    Spouse name: Not on file  . Number of children: 2  . Years of education: Not on file  . Highest education level: Not on file  Occupational History  . Occupation: Retired as Training and development officer at [employer]:    Social Needs  .  Financial resource strain: Not on file  . Food insecurity:    Worry: Not on file    Inability: Not on file  . Transportation needs:    Medical: Not on file    Non-medical: Not on file  Tobacco Use  . Smoking status: Former Research scientist (life sciences)  . Smokeless tobacco: Never Used  . Tobacco comment: age 70 when stopped smoking  Substance and Sexual Activity  . Alcohol use: No  . Drug use: No  . Sexual activity: Not on file  Lifestyle  . Physical activity:    Days per week: Not on file    Minutes per session: Not on file  . Stress: Not on file  Relationships  . Social connections:    Talks on phone: Not on file    Gets together: Not on file    Attends religious service: Not on file    Active member of club or organization: Not on file     Attends meetings of clubs or organizations: Not on file    Relationship status: Not on file  . Intimate partner violence:    Fear of current or ex partner: Not on file    Emotionally abused: Not on file    Physically abused: Not on file    Forced sexual activity: Not on file  Other Topics Concern  . Not on file  Social History Narrative   No living will   Son and daughter should make health care decisions for her   Would accept resuscitation   Not sure about tube feeds   Review of Systems Appetite is better Weight is back up slightly Sleeps okay---occasional restless nights (suggested taking the gabapentin at night) Bowels okay No chest pain or SOB    Objective:   Physical Exam  Constitutional: She appears well-developed. No distress.  Neck: No thyromegaly present.  Cardiovascular: Normal rate, regular rhythm, normal heart sounds and intact distal pulses. Exam reveals no gallop.  No murmur heard. Respiratory: Effort normal and breath sounds normal. No respiratory distress. She has no wheezes. She has no rales.  Musculoskeletal: She exhibits no edema.  Lymphadenopathy:    She has no cervical adenopathy.  Skin:  No foot lesions  Psychiatric: She has a normal mood and affect. Her behavior is normal.           Assessment & Plan:

## 2018-10-11 NOTE — Assessment & Plan Note (Signed)
Lab Results  Component Value Date   HGBA1C 6.2 (A) 10/11/2018   Control is excellent again On low dose prednisone only Discussed using the gabapentin at night

## 2018-10-17 ENCOUNTER — Other Ambulatory Visit: Payer: Self-pay

## 2018-10-17 ENCOUNTER — Other Ambulatory Visit: Payer: Self-pay | Admitting: Internal Medicine

## 2018-10-17 DIAGNOSIS — D649 Anemia, unspecified: Secondary | ICD-10-CM

## 2018-10-21 ENCOUNTER — Inpatient Hospital Stay: Payer: Medicare Other

## 2018-10-21 VITALS — BP 162/75 | HR 68 | Temp 96.2°F | Resp 18 | Wt 174.0 lb

## 2018-10-21 DIAGNOSIS — M3320 Polymyositis, organ involvement unspecified: Secondary | ICD-10-CM

## 2018-10-21 DIAGNOSIS — D649 Anemia, unspecified: Secondary | ICD-10-CM

## 2018-10-21 LAB — CBC WITH DIFFERENTIAL/PLATELET
Abs Immature Granulocytes: 0.02 10*3/uL (ref 0.00–0.07)
BASOS ABS: 0 10*3/uL (ref 0.0–0.1)
Basophils Relative: 1 %
EOS PCT: 2 %
Eosinophils Absolute: 0.1 10*3/uL (ref 0.0–0.5)
HCT: 31.9 % — ABNORMAL LOW (ref 36.0–46.0)
Hemoglobin: 10.1 g/dL — ABNORMAL LOW (ref 12.0–15.0)
Immature Granulocytes: 0 %
Lymphocytes Relative: 30 %
Lymphs Abs: 1.4 10*3/uL (ref 0.7–4.0)
MCH: 30.4 pg (ref 26.0–34.0)
MCHC: 31.7 g/dL (ref 30.0–36.0)
MCV: 96.1 fL (ref 80.0–100.0)
Monocytes Absolute: 0.2 10*3/uL (ref 0.1–1.0)
Monocytes Relative: 4 %
NRBC: 0 % (ref 0.0–0.2)
Neutro Abs: 3 10*3/uL (ref 1.7–7.7)
Neutrophils Relative %: 63 %
PLATELETS: 274 10*3/uL (ref 150–400)
RBC: 3.32 MIL/uL — AB (ref 3.87–5.11)
RDW: 14.2 % (ref 11.5–15.5)
WBC: 4.7 10*3/uL (ref 4.0–10.5)

## 2018-10-21 LAB — COMPREHENSIVE METABOLIC PANEL
ALK PHOS: 37 U/L — AB (ref 38–126)
ALT: 56 U/L — AB (ref 0–44)
AST: 57 U/L — ABNORMAL HIGH (ref 15–41)
Albumin: 3.7 g/dL (ref 3.5–5.0)
Anion gap: 10 (ref 5–15)
BUN: 16 mg/dL (ref 8–23)
CALCIUM: 9.1 mg/dL (ref 8.9–10.3)
CHLORIDE: 106 mmol/L (ref 98–111)
CO2: 26 mmol/L (ref 22–32)
CREATININE: 0.61 mg/dL (ref 0.44–1.00)
Glucose, Bld: 172 mg/dL — ABNORMAL HIGH (ref 70–99)
Potassium: 3.8 mmol/L (ref 3.5–5.1)
Sodium: 142 mmol/L (ref 135–145)
Total Bilirubin: 0.9 mg/dL (ref 0.3–1.2)
Total Protein: 7.2 g/dL (ref 6.5–8.1)

## 2018-10-21 MED ORDER — METHYLPREDNISOLONE SODIUM SUCC 125 MG IJ SOLR
40.0000 mg | Freq: Every day | INTRAMUSCULAR | Status: DC
  Administered 2018-10-21: 40 mg via INTRAVENOUS
  Filled 2018-10-21: qty 2

## 2018-10-21 MED ORDER — DEXTROSE 5 % IV SOLN
Freq: Once | INTRAVENOUS | Status: AC
  Administered 2018-10-21: 09:00:00 via INTRAVENOUS
  Filled 2018-10-21: qty 250

## 2018-10-21 MED ORDER — ACETAMINOPHEN 325 MG PO TABS
650.0000 mg | ORAL_TABLET | Freq: Every day | ORAL | Status: DC
  Administered 2018-10-21: 650 mg via ORAL
  Filled 2018-10-21: qty 2

## 2018-10-21 MED ORDER — IMMUNE GLOBULIN (HUMAN) 20 GM/200ML IV SOLN
75.0000 g | INTRAVENOUS | Status: DC
  Administered 2018-10-21: 75 g via INTRAVENOUS
  Filled 2018-10-21: qty 600

## 2018-10-21 MED ORDER — DIPHENHYDRAMINE HCL 25 MG PO CAPS
25.0000 mg | ORAL_CAPSULE | Freq: Every day | ORAL | Status: DC
  Administered 2018-10-21: 25 mg via ORAL
  Filled 2018-10-21: qty 1

## 2018-10-22 ENCOUNTER — Inpatient Hospital Stay: Payer: Medicare Other

## 2018-10-22 VITALS — BP 173/77 | HR 66 | Temp 96.1°F | Resp 18

## 2018-10-22 DIAGNOSIS — M3320 Polymyositis, organ involvement unspecified: Secondary | ICD-10-CM

## 2018-10-22 MED ORDER — ACETAMINOPHEN 325 MG PO TABS
650.0000 mg | ORAL_TABLET | Freq: Every day | ORAL | Status: DC
  Administered 2018-10-22: 650 mg via ORAL
  Filled 2018-10-22: qty 2

## 2018-10-22 MED ORDER — METHYLPREDNISOLONE SODIUM SUCC 125 MG IJ SOLR
40.0000 mg | Freq: Every day | INTRAMUSCULAR | Status: DC
  Administered 2018-10-22: 40 mg via INTRAVENOUS
  Filled 2018-10-22: qty 2

## 2018-10-22 MED ORDER — DIPHENHYDRAMINE HCL 25 MG PO CAPS
ORAL_CAPSULE | ORAL | Status: AC
  Filled 2018-10-22: qty 1

## 2018-10-22 MED ORDER — DEXTROSE 5 % IV SOLN
Freq: Once | INTRAVENOUS | Status: AC
  Administered 2018-10-22: 09:00:00 via INTRAVENOUS
  Filled 2018-10-22: qty 250

## 2018-10-22 MED ORDER — IMMUNE GLOBULIN (HUMAN) 20 GM/200ML IV SOLN
75.0000 g | INTRAVENOUS | Status: DC
  Administered 2018-10-22: 75 g via INTRAVENOUS
  Filled 2018-10-22: qty 600

## 2018-10-22 MED ORDER — DIPHENHYDRAMINE HCL 25 MG PO CAPS
25.0000 mg | ORAL_CAPSULE | Freq: Every day | ORAL | Status: DC
  Administered 2018-10-22: 25 mg via ORAL
  Filled 2018-10-22: qty 1

## 2018-10-22 NOTE — Patient Instructions (Signed)

## 2018-11-15 ENCOUNTER — Other Ambulatory Visit: Payer: Self-pay

## 2018-11-15 DIAGNOSIS — D649 Anemia, unspecified: Secondary | ICD-10-CM

## 2018-11-18 ENCOUNTER — Other Ambulatory Visit: Payer: Self-pay

## 2018-11-18 ENCOUNTER — Inpatient Hospital Stay: Payer: Medicare Other

## 2018-11-18 ENCOUNTER — Ambulatory Visit: Payer: Medicare Other | Admitting: Oncology

## 2018-11-18 ENCOUNTER — Inpatient Hospital Stay: Payer: Medicare Other | Attending: Oncology

## 2018-11-18 ENCOUNTER — Inpatient Hospital Stay (HOSPITAL_BASED_OUTPATIENT_CLINIC_OR_DEPARTMENT_OTHER): Payer: Medicare Other | Admitting: Oncology

## 2018-11-18 VITALS — BP 180/74 | HR 70 | Temp 96.0°F | Resp 18

## 2018-11-18 VITALS — BP 156/78 | HR 72 | Temp 94.2°F | Resp 18 | Wt 170.0 lb

## 2018-11-18 DIAGNOSIS — M3320 Polymyositis, organ involvement unspecified: Secondary | ICD-10-CM

## 2018-11-18 DIAGNOSIS — D649 Anemia, unspecified: Secondary | ICD-10-CM

## 2018-11-18 DIAGNOSIS — Z87891 Personal history of nicotine dependence: Secondary | ICD-10-CM | POA: Diagnosis not present

## 2018-11-18 DIAGNOSIS — I10 Essential (primary) hypertension: Secondary | ICD-10-CM

## 2018-11-18 DIAGNOSIS — E119 Type 2 diabetes mellitus without complications: Secondary | ICD-10-CM | POA: Diagnosis not present

## 2018-11-18 DIAGNOSIS — Z79899 Other long term (current) drug therapy: Secondary | ICD-10-CM

## 2018-11-18 LAB — CBC WITH DIFFERENTIAL/PLATELET
ABS IMMATURE GRANULOCYTES: 0.02 10*3/uL (ref 0.00–0.07)
Basophils Absolute: 0 10*3/uL (ref 0.0–0.1)
Basophils Relative: 1 %
EOS ABS: 0.1 10*3/uL (ref 0.0–0.5)
Eosinophils Relative: 2 %
HEMATOCRIT: 32.4 % — AB (ref 36.0–46.0)
Hemoglobin: 10.2 g/dL — ABNORMAL LOW (ref 12.0–15.0)
IMMATURE GRANULOCYTES: 0 %
Lymphocytes Relative: 30 %
Lymphs Abs: 1.7 10*3/uL (ref 0.7–4.0)
MCH: 29.9 pg (ref 26.0–34.0)
MCHC: 31.5 g/dL (ref 30.0–36.0)
MCV: 95 fL (ref 80.0–100.0)
Monocytes Absolute: 0.3 10*3/uL (ref 0.1–1.0)
Monocytes Relative: 5 %
NEUTROS PCT: 62 %
Neutro Abs: 3.6 10*3/uL (ref 1.7–7.7)
Platelets: 243 10*3/uL (ref 150–400)
RBC: 3.41 MIL/uL — ABNORMAL LOW (ref 3.87–5.11)
RDW: 15 % (ref 11.5–15.5)
WBC: 5.8 10*3/uL (ref 4.0–10.5)
nRBC: 0 % (ref 0.0–0.2)

## 2018-11-18 LAB — COMPREHENSIVE METABOLIC PANEL
ALK PHOS: 35 U/L — AB (ref 38–126)
ALT: 56 U/L — ABNORMAL HIGH (ref 0–44)
AST: 58 U/L — ABNORMAL HIGH (ref 15–41)
Albumin: 3.7 g/dL (ref 3.5–5.0)
Anion gap: 8 (ref 5–15)
BILIRUBIN TOTAL: 0.7 mg/dL (ref 0.3–1.2)
BUN: 18 mg/dL (ref 8–23)
CALCIUM: 9 mg/dL (ref 8.9–10.3)
CO2: 30 mmol/L (ref 22–32)
Chloride: 105 mmol/L (ref 98–111)
Creatinine, Ser: 0.59 mg/dL (ref 0.44–1.00)
GFR calc non Af Amer: >60 mL/min (ref >60)
Glucose, Bld: 127 mg/dL — ABNORMAL HIGH (ref 70–99)
Potassium: 3.6 mmol/L (ref 3.5–5.1)
SODIUM: 143 mmol/L (ref 135–145)
TOTAL PROTEIN: 7.2 g/dL (ref 6.5–8.1)

## 2018-11-18 MED ORDER — ACETAMINOPHEN 325 MG PO TABS
650.0000 mg | ORAL_TABLET | Freq: Every day | ORAL | Status: DC
  Administered 2018-11-18: 650 mg via ORAL
  Filled 2018-11-18: qty 2

## 2018-11-18 MED ORDER — METHYLPREDNISOLONE SODIUM SUCC 125 MG IJ SOLR
40.0000 mg | INTRAMUSCULAR | Status: DC
  Administered 2018-11-18: 40 mg via INTRAVENOUS
  Filled 2018-11-18: qty 2

## 2018-11-18 MED ORDER — DEXTROSE 5 % IV SOLN
Freq: Once | INTRAVENOUS | Status: AC
  Administered 2018-11-18: 09:00:00 via INTRAVENOUS
  Filled 2018-11-18: qty 250

## 2018-11-18 MED ORDER — DIPHENHYDRAMINE HCL 25 MG PO CAPS
25.0000 mg | ORAL_CAPSULE | Freq: Every day | ORAL | Status: DC
  Administered 2018-11-18: 25 mg via ORAL
  Filled 2018-11-18: qty 1

## 2018-11-18 MED ORDER — IMMUNE GLOBULIN (HUMAN) 20 GM/200ML IV SOLN
75.0000 g | INTRAVENOUS | Status: DC
  Administered 2018-11-18: 75 g via INTRAVENOUS
  Filled 2018-11-18: qty 50

## 2018-11-18 NOTE — Patient Instructions (Signed)

## 2018-11-18 NOTE — Progress Notes (Signed)
Here for follow up. Currently in chemo suite -stated " feeling good overall"

## 2018-11-19 ENCOUNTER — Inpatient Hospital Stay: Payer: Medicare Other

## 2018-11-19 ENCOUNTER — Encounter: Payer: Self-pay | Admitting: Oncology

## 2018-11-19 VITALS — BP 180/83 | HR 71 | Temp 96.6°F | Resp 18

## 2018-11-19 DIAGNOSIS — M3320 Polymyositis, organ involvement unspecified: Secondary | ICD-10-CM

## 2018-11-19 MED ORDER — IMMUNE GLOBULIN (HUMAN) 5 GM/50ML IV SOLN
1.0000 g/kg | INTRAVENOUS | Status: DC
  Administered 2018-11-19: 75 g via INTRAVENOUS
  Filled 2018-11-19: qty 50

## 2018-11-19 MED ORDER — ACETAMINOPHEN 325 MG PO TABS
650.0000 mg | ORAL_TABLET | Freq: Every day | ORAL | Status: DC
  Administered 2018-11-19: 650 mg via ORAL

## 2018-11-19 MED ORDER — ACETAMINOPHEN 325 MG PO TABS
ORAL_TABLET | ORAL | Status: AC
  Filled 2018-11-19: qty 2

## 2018-11-19 MED ORDER — DEXTROSE 5 % IV SOLN
Freq: Once | INTRAVENOUS | Status: AC
  Administered 2018-11-19: 09:00:00 via INTRAVENOUS
  Filled 2018-11-19: qty 250

## 2018-11-19 MED ORDER — DIPHENHYDRAMINE HCL 25 MG PO TABS
25.0000 mg | ORAL_TABLET | Freq: Every day | ORAL | Status: DC
  Filled 2018-11-19: qty 1

## 2018-11-19 MED ORDER — METHYLPREDNISOLONE SODIUM SUCC 125 MG IJ SOLR
40.0000 mg | INTRAMUSCULAR | Status: DC
  Administered 2018-11-19: 40 mg via INTRAVENOUS
  Filled 2018-11-19: qty 2

## 2018-11-19 MED ORDER — DIPHENHYDRAMINE HCL 25 MG PO CAPS
25.0000 mg | ORAL_CAPSULE | Freq: Every day | ORAL | Status: DC
  Administered 2018-11-19: 25 mg via ORAL
  Filled 2018-11-19: qty 1

## 2018-11-19 NOTE — Progress Notes (Signed)
Patient tolerated 2 days of IVIG as ordered without difficulty. She has her next appt with Dr [name] Dec 17th @ 3:30pm.

## 2018-11-19 NOTE — Progress Notes (Signed)
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 She is currently on Imuran for the same. Her past medical history is also significant for diabetes for which insulin and metformin and hypertension. She is also currently on steroids for her myopathy and is currently on 20 mg. Patient had biopsy of the left deltoid in June 2018 which showed immune mediated inflammatory myopathy.Plan was to proceed with IVIG if her CK kept going and her recent CT was noted to be 2743 on 11/08/2017 higher than 2405 in October 2018. Patient had a CT abdomen done in June 2018 which did not reveal any acute findings in chest abdomen or pelvis. Recent blood work from 11/08/2017 showed white count of 7.7, H&H of 10.3/32.6 with an MCV of 99.1 and a platelet count of 332. She was found to have b12 deficiency and is currently on oral B12.    [name] per rheumatology recs  Interval history-patient last received her IVIG treatment about 8 weeks ago and tolerated it well without any significant side effects.  She did have a transient headache after the IVIG which resolved.  Today patient is doing well and she denies any significant complaints  ECOG PS- 1 Pain scale- 0 Opioid associated constipation- no  Review of systems- Review of Systems  Constitutional: Negative for chills, fever, malaise/fatigue and weight loss.  HENT: Negative for congestion, ear discharge and nosebleeds.   Eyes: Negative for blurred vision.    Respiratory: Negative for cough, hemoptysis, sputum production, shortness of breath and wheezing.   Cardiovascular: Negative for chest pain, palpitations, orthopnea and claudication.  Gastrointestinal: Negative for abdominal pain, blood in stool, constipation, diarrhea, heartburn, melena, nausea and vomiting.  Genitourinary: Negative for dysuria, flank pain, frequency, hematuria and urgency.  Musculoskeletal: Negative for back pain, joint pain and myalgias.  Skin: Negative for rash.  Neurological: Negative for dizziness, tingling, focal weakness, seizures, weakness and headaches.  Endo/Heme/Allergies: Does not bruise/bleed easily.  Psychiatric/Behavioral: Negative for depression and suicidal ideas. The patient does not have insomnia.       Allergies  Allergen Reactions  . Atorvastatin Other (See Comments)    myalgias  . Pravastatin Other (See Comments)    Muscle pain     Past Medical History:  Diagnosis Date  . Allergy   . Asthma   . Diabetes mellitus   . Hyperlipidemia   . Hypertension   . Neuromuscular disorder (HCC)    polymyositis  . Osteoarthritis   . Vitamin B12 deficiency      Past Surgical History:  Procedure Laterality Date  . COLONOSCOPY WITH PROPOFOL N/A 03/07/2016   Procedure: COLONOSCOPY WITH PROPOFOL;  Surgeon: Lollie Sails, MD;  Location: [facility] ENDOSCOPY;  Service: Endoscopy;  Laterality: N/A;    Social History   Socioeconomic History  . Marital status: Widowed    Spouse name: Not on file  . Number of children: 2  . Years of education: Not on file  . Highest education level: Not on file  Occupational History  . 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 Financial resource strain: Not on file  . Food insecurity:    Worry: Not on file    Inability: Not on file  . Transportation needs:    Medical: Not on file    Non-medical: Not on file  Tobacco Use  . Smoking status: Former Research scientist (life sciences)  .  Smokeless tobacco: Never Used  . Tobacco comment: age 63 when stopped smoking  Substance and Sexual Activity  . Alcohol use: No  . Drug use: No  . Sexual activity: Not on file  Lifestyle  . Physical activity:    Days per week: Not on file    Minutes per session: Not on file  . Stress: Not on file  Relationships  . Social connections:    Talks on phone: Not on file    Gets together: Not on file    Attends religious service: Not on file    Active member of club or organization: Not on file    Attends meetings of clubs or organizations: Not on file    Relationship status: Not on file  . Intimate partner violence:    Fear of current or ex partner: Not on file    Emotionally abused: Not on file    Physically abused: Not on file    Forced sexual activity: Not on file  Other Topics Concern  . Not on file  Social History Narrative   No living will   Son and daughter should make health care decisions for her   Would accept resuscitation   Not sure about tube feeds    Family History  Problem Relation Age of Onset  . Heart disease Mother   . Cancer Father      Current Outpatient Medications:  .  ACCU-CHEK FASTCLIX LANCETS MISC, Use to test blood sugar twice daily dx: 250.02, Disp: 100 each, Rfl: 3 .  ACCU-CHEK SMARTVIEW test strip, CHECK BLOOD SUGAR TWICE A  DAY AND AS DIRECTED, Disp: 200 each, Rfl: 3 .  Alcohol Swabs (B-D SINGLE USE SWABS REGULAR) PADS, Use to test blood sugar once daily dx: 250.00, Disp: 100 each, Rfl: 3 .  alendronate (FOSAMAX) 70 MG tablet, TAKE 1 TABLET BY MOUTH EVERY 7 DAYS WITH A FULL GLASS OF WATER, DO NOT LIE DOWN FOR THE NEXT 30 MINUTES, Disp: 12 tablet, Rfl: 3 .  azaTHIOprine (IMURAN) 50 MG tablet, take 3.5 tablets by mouth once daily, Disp: 105 tablet, Rfl: 0 .  Cholecalciferol (VITAMIN D) 1000 UNITS capsule, Take 1,000 Units by mouth daily.  , Disp: , Rfl:  .  folic acid (FOLVITE) 1 MG tablet, Take 1 mg by mouth daily., Disp: , Rfl:  .  lisinopril  (PRINIVIL,ZESTRIL) 10 MG tablet, TAKE 1 TABLET BY MOUTH EVERY DAY, Disp: 90 tablet, Rfl: 1 .  metFORMIN (GLUCOPHAGE) 1000 MG tablet, TAKE 1 TABLET BY MOUTH TWICE DAILY, Disp: 60 tablet, Rfl: 11 .  Multiple Vitamin (MULTIVITAMIN) tablet, Take 1 tablet by mouth daily.  , Disp: , Rfl:  .  predniSONE (DELTASONE) 5 MG tablet, Take 5 mg by mouth daily with breakfast., Disp: , Rfl:  .  albuterol (PROVENTIL HFA;VENTOLIN HFA) 108 (90 BASE) MCG/ACT inhaler, Inhale 2 puffs into the lungs every 6 (six) hours as needed for wheezing or shortness of breath. (Patient not taking: Reported on 11/18/2018), Disp: 2 Inhaler, Rfl: 3 .  Blood Pressure Monitoring (BLOOD PRESSURE CUFF) MISC, Measure blood pressure once daily as directed. (Patient not  taking: Reported on 11/18/2018), Disp: 1 each, Rfl: 0 .  fluticasone (FLOVENT HFA) 110 MCG/ACT inhaler, Inhale 2 puffs into the lungs daily. (Patient not taking: Reported on 11/18/2018), Disp: 12 g, Rfl: 3 .  tiZANidine (ZANAFLEX) 4 MG tablet, Take 1 tablet by mouth at bedtime as needed., Disp: , Rfl: 0 No current facility-administered medications for this visit.   Facility-Administered Medications Ordered in Other Visits:  .  acetaminophen (TYLENOL) tablet 650 mg, 650 mg, Oral, Daily, Sindy Guadeloupe, MD .  diphenhydrAMINE (BENADRYL) capsule 25 mg, 25 mg, Oral, Daily, Sindy Guadeloupe, MD .  diphenhydrAMINE (BENADRYL) tablet 25 mg, 25 mg, Oral, Daily, Sindy Guadeloupe, MD .  Immune Globulin 10% (PRIVIGEN) IV infusion 75 g, 1 g/kg, Intravenous, Q24 Hr x 2, Sindy Guadeloupe, MD .  methylPREDNISolone sodium succinate (SOLU-MEDROL) 125 mg/2 mL injection 40 mg, 40 mg, Intravenous, Q24H, Sindy Guadeloupe, MD  Physical exam:  Vitals:   11/18/18 0900  BP: (!) 156/78  Pulse: 72  Resp: 18  Temp: (!) 94.2 F (34.6 C)  TempSrc: Tympanic  Weight: 170 lb (77.1 kg)   Physical Exam  Constitutional: She is oriented to person, place, and time. She appears well-developed and well-nourished.    HENT:  Head: Normocephalic and atraumatic.  Eyes: Pupils are equal, round, and reactive to light. EOM are normal.  Neck: Normal range of motion.  Cardiovascular: Normal rate, regular rhythm and normal heart sounds.  Pulmonary/Chest: Effort normal and breath sounds normal.  Abdominal: Soft. Bowel sounds are normal.  Neurological: She is alert and oriented to person, place, and time.  Skin: Skin is warm and dry.     CMP Latest Ref Rng & Units 11/18/2018  Glucose 70 - 99 mg/dL 127(H)  BUN 8 - 23 mg/dL 18  Creatinine 0.44 - 1.00 mg/dL 0.59  Sodium 135 - 145 mmol/L 143  Potassium 3.5 - 5.1 mmol/L 3.6  Chloride 98 - 111 mmol/L 105  CO2 22 - 32 mmol/L 30  Calcium 8.9 - 10.3 mg/dL 9.0  Total Protein 6.5 - 8.1 g/dL 7.2  Total Bilirubin 0.3 - 1.2 mg/dL 0.7  Alkaline Phos 38 - 126 U/L 35(L)  AST 15 - 41 U/L 58(H)  ALT 0 - 44 U/L 56(H)   CBC Latest Ref Rng & Units 11/18/2018  WBC 4.0 - 10.5 K/uL 5.8  Hemoglobin 12.0 - 15.0 g/dL 10.2(L)  Hematocrit 36.0 - 46.0 % 32.4(L)  Platelets 150 - 400 K/uL 243     Assessment and plan- Patient is a 72 y.o. female with history of polymyositis who is currently receiving IVIG per rheumatology recs here to receive her next dose of her IVIG  Counts are okay to proceed with the next dose of IVIG today.  She does have baseline hemoglobin of 10 which has remained stable.  She has mildly elevated AST and ALT which is again stable as compared to 3 weeks ago.  Continue to monitor.  We have tentatively scheduled her for the next dose of IVIG on 12/23/2018 but that may be postponed based on rheumatology recs.  Her subsequent appointments will be set up with pulse based on when rheumatology wants [place] to give her IVIG   Visit Diagnosis 1. Long-term current use of intravenous immunoglobulin (IVIG)      Dr. Randa Evens, MD, MPH [facility] [phone] 11/19/2018 8:39 AM

## 2018-12-16 ENCOUNTER — Telehealth: Payer: Self-pay | Admitting: *Deleted

## 2018-12-16 NOTE — Telephone Encounter (Signed)
Called and left message with secretary that Anderson Malta the nurse for Dr. [name] is at lunch and will call me back on my cell phone- it is about if her labs are better does she need IVIG next week or on a different schedule now.  Dr. [name] notes indicate that he would like the IVIG moved out to every 2 months but need confirmation of this with new order

## 2018-12-23 ENCOUNTER — Telehealth: Payer: Self-pay | Admitting: *Deleted

## 2018-12-23 ENCOUNTER — Other Ambulatory Visit: Payer: Self-pay | Admitting: Oncology

## 2018-12-23 ENCOUNTER — Inpatient Hospital Stay: Payer: Medicare Other | Attending: Oncology

## 2018-12-23 VITALS — BP 157/75 | HR 82 | Temp 98.0°F | Resp 17 | Wt 170.0 lb

## 2018-12-23 DIAGNOSIS — M3320 Polymyositis, organ involvement unspecified: Secondary | ICD-10-CM | POA: Diagnosis not present

## 2018-12-23 MED ORDER — ACETAMINOPHEN 325 MG PO TABS
650.0000 mg | ORAL_TABLET | Freq: Every day | ORAL | Status: DC
  Administered 2018-12-23: 650 mg via ORAL
  Filled 2018-12-23: qty 2

## 2018-12-23 MED ORDER — METHYLPREDNISOLONE SODIUM SUCC 125 MG IJ SOLR
40.0000 mg | INTRAMUSCULAR | Status: DC
  Administered 2018-12-23: 40 mg via INTRAVENOUS

## 2018-12-23 MED ORDER — SODIUM CHLORIDE 0.9 % IV SOLN
Freq: Once | INTRAVENOUS | Status: AC
  Administered 2018-12-23: 09:00:00 via INTRAVENOUS
  Filled 2018-12-23: qty 250

## 2018-12-23 MED ORDER — IMMUNE GLOBULIN (HUMAN) 20 GM/200ML IV SOLN
75.0000 g | INTRAVENOUS | Status: DC
  Administered 2018-12-23: 75 g via INTRAVENOUS
  Filled 2018-12-23: qty 600

## 2018-12-23 MED ORDER — DEXTROSE 5 % IV SOLN
INTRAVENOUS | Status: DC
  Administered 2018-12-23: 09:00:00 via INTRAVENOUS
  Filled 2018-12-23: qty 250

## 2018-12-23 MED ORDER — DIPHENHYDRAMINE HCL 25 MG PO CAPS
25.0000 mg | ORAL_CAPSULE | Freq: Every day | ORAL | Status: DC
  Administered 2018-12-23: 25 mg via ORAL
  Filled 2018-12-23: qty 1

## 2018-12-23 NOTE — Telephone Encounter (Signed)
Patient scheduled and got IVIG today and will get it again tom.  His response on 12/23 was he wanted her to have the IVIG monthly until her cardiac levels come down some more.  Tomorrow is the last day we have her scheduled. I called Dr. [name] office to ask when is the next time patient will get her CK levels drawn and I will make future appts for patient as told by dr. [name]. Sent a message to jennifer to get back in touch with me about the next lab draw of ck levels. Await call back

## 2018-12-23 NOTE — Telephone Encounter (Signed)
Nurse called back after 5 and said that he wants the labs to get lower before he pushes her out to every 2 months so she will need her IVIG for jan 2020.

## 2018-12-24 ENCOUNTER — Inpatient Hospital Stay: Payer: Medicare Other

## 2018-12-24 VITALS — BP 170/76 | HR 68 | Temp 98.5°F | Resp 16 | Wt 170.0 lb

## 2018-12-24 DIAGNOSIS — M3320 Polymyositis, organ involvement unspecified: Secondary | ICD-10-CM | POA: Diagnosis not present

## 2018-12-24 MED ORDER — IMMUNE GLOBULIN (HUMAN) 5 GM/50ML IV SOLN
1.0000 g/kg | INTRAVENOUS | Status: DC
  Administered 2018-12-24: 75 g via INTRAVENOUS
  Filled 2018-12-24: qty 50

## 2018-12-24 MED ORDER — METHYLPREDNISOLONE SODIUM SUCC 125 MG IJ SOLR
40.0000 mg | INTRAMUSCULAR | Status: DC
  Administered 2018-12-24: 09:00:00 via INTRAVENOUS
  Filled 2018-12-24: qty 2

## 2018-12-24 MED ORDER — DIPHENHYDRAMINE HCL 25 MG PO CAPS
ORAL_CAPSULE | ORAL | Status: AC
  Filled 2018-12-24: qty 1

## 2018-12-24 MED ORDER — DEXTROSE 5 % IV SOLN
Freq: Once | INTRAVENOUS | Status: AC
  Administered 2018-12-24: 09:00:00 via INTRAVENOUS
  Filled 2018-12-24: qty 250

## 2018-12-24 MED ORDER — DIPHENHYDRAMINE HCL 25 MG PO TABS
25.0000 mg | ORAL_TABLET | Freq: Every day | ORAL | Status: DC
  Administered 2018-12-24: 25 mg via ORAL
  Filled 2018-12-24: qty 1

## 2018-12-24 MED ORDER — ACETAMINOPHEN 325 MG PO TABS
650.0000 mg | ORAL_TABLET | Freq: Every day | ORAL | Status: DC
  Administered 2018-12-24: 650 mg via ORAL
  Filled 2018-12-24: qty 2

## 2019-01-03 ENCOUNTER — Encounter: Payer: Self-pay | Admitting: Rheumatology

## 2019-01-06 ENCOUNTER — Other Ambulatory Visit: Payer: Self-pay | Admitting: *Deleted

## 2019-01-06 DIAGNOSIS — M3320 Polymyositis, organ involvement unspecified: Secondary | ICD-10-CM

## 2019-01-14 ENCOUNTER — Other Ambulatory Visit (HOSPITAL_COMMUNITY): Payer: Self-pay | Admitting: Rheumatology

## 2019-01-14 ENCOUNTER — Other Ambulatory Visit: Payer: Self-pay | Admitting: Rheumatology

## 2019-01-14 DIAGNOSIS — M545 Low back pain, unspecified: Secondary | ICD-10-CM

## 2019-01-18 ENCOUNTER — Other Ambulatory Visit: Payer: Self-pay | Admitting: Hematology and Oncology

## 2019-01-18 NOTE — Progress Notes (Signed)
[facility] day:  01/20/2019  Chief Complaint: Bridget Romero is a 73 y.o. female with polymyositis who is seen for new patient assessment and continuation of IVIG.  HPI:  The patient was diagnosed in 2018.  She noted the inability to get up from a chair in 03/2017. She started using a rolling walker.  CPK was > 5500.  She was referred to Dr. Cristi Loron on 06/05/2017.  Chest, abdomen, and pelvic CT on 06/11/2017 were unremarkable.  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  Biopsy and antibodies were positive.  She was started on prednisone 60 mg a day.  She was admitted to a nursing home on 07/04/2017.  Imuran was added on 07/05/2017.  She is currently on prednisone 5 mg for her myopathy. Imuran is down to 2 pills/day secondary to issues with her appetite.  She receives IVIG per recommendation by rheumatology.  She was last seen by Dr Janese Banks on 11/18/2018. She was tolerating her IVIG well without any significant side effects.  She described a transient headache after the IVIG.   She has been receiving IVIG (Privigen) 75 gm x 2 days every 4 weeks beginning 12/07/2017 (last 2 day infusion 12/30-12/31/2019).  She receives Solumedrol 40 mg IV, Benadryl 25 mg po, and Tylenol 650 mg po as premeds.  She was last seen by Dr. [name] on 01/01/2019.  Notes reviewed.  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  She is also on azathioprine (Imuran) and prednisone 5 mg a day.  She had back pain.  LS spine films on 01/01/2019 revealed mild diffuse degenerative change, facet degenerative change in the lower lumbar spine. There was no compression fracture or spondylolisthesis.  Lumbar spine MRI has been ordered for 01/22/2019.  CK was 1423.  CBC revealed a hematocrit of 33.9, hemoglobin 10.6, MCV 97.4, platelets 247,0000, WBC 6100 with an [place] of 4670.  AST was 54 and ALT 56.  Aldolase was 14.1.  CK has been followed:   > 5555 on 05/30/2017, 4409 on 06/20/2017, 3546 on 07/05/2017, 2725 on 07/30/2017, 3675 on 08/28/2017, 2405 on 10/08/2017, 2743 on 11/08/2017, 2723 on 12/20/2017, 723 on 01/21/2018, 507 on 02/21/2018, 2192 on 04/11/2018, 2227 on 06/04/2018, 4468 on 07/22/2018, 2204 on 09/06/2018, 1360 on 12/02/2018, and 1423 on 01/01/2019.  She has a baseline hemoglobin of 10.  CBC on 11/08/2017 revealed a hematocrit of 32.6, hemoglobin 10.3, MCV 99.1, and platelets 332,000.  B12 was 306 and folate > 22.3 on 05/30/2017.  She has a B12 deficiency and is on oral B12.   Last colonoscopy on 03/07/2016 revealed grade I hemorrhoids.  Symptomatically, she is doing well.  She is gaining weight.  She notes that she hurt her back picking up something recently.  She is taking ibuprofen for back discomfort.   Past Medical History:  Diagnosis Date  . Allergy   . Asthma   . Diabetes mellitus   . Hyperlipidemia   . Hypertension   . Neuromuscular disorder (HCC)    polymyositis  . Osteoarthritis   . Vitamin B12 deficiency     Past Surgical History:  Procedure Laterality Date  . COLONOSCOPY WITH PROPOFOL N/A 03/07/2016   Procedure: COLONOSCOPY WITH PROPOFOL;  Surgeon: Lollie Sails, MD;  Location: [facility] ENDOSCOPY;  Service: Endoscopy;  Laterality: N/A;    Family History  Problem Relation Age of Onset  . Heart disease Mother   . Cancer Father     Social History:  reports that she has quit smoking. She has never used smokeless tobacco. She reports that she does not drink alcohol or use drugs.  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  The patient is alone today.  Allergies:  Allergies  Allergen Reactions  . Atorvastatin Other (See Comments)    myalgias  . Pravastatin Other (See Comments)    Muscle pain    Current Medications: Current Outpatient Medications  Medication Sig Dispense Refill  . alendronate (FOSAMAX) 70 MG tablet TAKE 1 TABLET BY MOUTH EVERY 7 DAYS WITH A FULL GLASS OF WATER, DO NOT LIE DOWN  FOR THE NEXT 30 MINUTES 12 tablet 3  . azaTHIOprine (IMURAN) 50 MG tablet take 3.5 tablets by mouth once daily 105 tablet 0  . Cholecalciferol (VITAMIN D) 1000 UNITS capsule Take 1,000 Units by mouth daily.      . folic acid (FOLVITE) 1 MG tablet Take 1 mg by mouth daily.    Marland Kitchen lisinopril (PRINIVIL,ZESTRIL) 10 MG tablet TAKE 1 TABLET BY MOUTH EVERY DAY 90 tablet 1  . metFORMIN (GLUCOPHAGE) 1000 MG tablet TAKE 1 TABLET BY MOUTH TWICE DAILY 60 tablet 11  . Multiple Vitamin (MULTIVITAMIN) tablet Take 1 tablet by mouth daily.      . predniSONE (DELTASONE) 5 MG tablet Take 5 mg by mouth daily with breakfast.    . tiZANidine (ZANAFLEX) 4 MG tablet Take 1 tablet by mouth at bedtime as needed.  0  . ACCU-CHEK FASTCLIX LANCETS MISC Use to test blood sugar twice daily dx: 250.02 (Patient not taking: Reported on 01/20/2019) 100 each 3  . ACCU-CHEK SMARTVIEW test strip CHECK BLOOD SUGAR TWICE A  DAY AND AS DIRECTED (Patient not taking: Reported on 01/20/2019) 200 each 3  . albuterol (PROVENTIL HFA;VENTOLIN HFA) 108 (90 BASE) MCG/ACT inhaler Inhale 2 puffs into the lungs every 6 (six) hours as needed for wheezing or shortness of breath. (Patient not taking: Reported on 11/18/2018) 2 Inhaler 3  . Alcohol Swabs (B-D SINGLE USE SWABS REGULAR) PADS Use to test blood sugar once daily dx: 250.00 (Patient not taking: Reported on 01/20/2019) 100 each 3  . Blood Pressure Monitoring (BLOOD PRESSURE CUFF) MISC Measure blood pressure once daily as directed. (Patient not taking: Reported on 11/18/2018) 1 each 0  . fluticasone (FLOVENT HFA) 110 MCG/ACT inhaler Inhale 2 puffs into the lungs daily. (Patient not taking: Reported on 11/18/2018) 12 g 3   No current facility-administered medications for this visit.     Review of Systems:  GENERAL:  Feels good.  No fevers, sweats or weight loss. PERFORMANCE STATUS (ECOG):  1 HEENT:  No visual changes, runny nose, sore throat, mouth sores or tenderness. Lungs: No shortness of  breath or cough.  No hemoptysis. Cardiac:  No chest pain, palpitations, orthopnea, or PND. GI:  No nausea, vomiting, diarrhea, constipation, melena or hematochezia. GU:  No urgency, frequency, dysuria, or hematuria. Musculoskeletal:  Back pain after lifting (see HPI).  No joint pain.  No muscle tenderness. Extremities:  No pain or swelling. Skin:  No rashes or skin changes. Neuro:  No headache, numbness or weakness, balance or coordination issues. Endocrine:  Diabetes.  No thyroid issues, hot flashes or night sweats. Psych:  No mood changes, depression or anxiety. Pain:  No focal pain. Review of systems:  All other systems reviewed and found to be negative.  Physical Exam: Blood pressure (!) 145/65, pulse 76, temperature 98.3 F (36.8 C), temperature source Tympanic, resp. rate 18, height 5\' 5"  (1.651  m), weight 179 lb 5.5 oz (81.4 kg), SpO2 100 %. GENERAL:  Well developed, well nourished, woman sitting comfortably in the exam room in no acute distress. MENTAL STATUS:  Alert and oriented to person, place and time. HEAD:  Normocephalic, atraumatic, face symmetric, no Cushingoid features. EYES:  Pupils equal round and reactive to light and accomodation.  No conjunctivitis or scleral icterus. ENT:  Oropharynx clear without lesion.  Tongue normal. Mucous membranes moist.  RESPIRATORY:  Clear to auscultation without rales, wheezes or rhonchi. CARDIOVASCULAR:  Regular rate and rhythm without murmur, rub or gallop. ABDOMEN:  Soft, non-tender, with active bowel sounds, and no hepatosplenomegaly.  No masses. SKIN:  No rashes, ulcers or lesions. EXTREMITIES: No edema, no skin discoloration or tenderness.  No palpable cords. LYMPH NODES: No palpable cervical, supraclavicular, axillary or inguinal adenopathy  NEUROLOGICAL: Unremarkable. PSYCH:  Appropriate.   Appointment on 01/20/2019  Component Date Value Ref Range Status  . Sodium 01/20/2019 139  135 - 145 mmol/L Final  . Potassium 01/20/2019  3.8  3.5 - 5.1 mmol/L Final  . Chloride 01/20/2019 104  98 - 111 mmol/L Final  . CO2 01/20/2019 27  22 - 32 mmol/L Final  . Glucose, Bld 01/20/2019 157* 70 - 99 mg/dL Final  . BUN 01/20/2019 18  8 - 23 mg/dL Final  . Creatinine, Ser 01/20/2019 0.54  0.44 - 1.00 mg/dL Final  . Calcium 01/20/2019 9.1  8.9 - 10.3 mg/dL Final  . Total Protein 01/20/2019 6.9  6.5 - 8.1 g/dL Final  . Albumin 01/20/2019 3.7  3.5 - 5.0 g/dL Final  . AST 01/20/2019 52* 15 - 41 U/L Final  . ALT 01/20/2019 52* 0 - 44 U/L Final  . Alkaline Phosphatase 01/20/2019 40  38 - 126 U/L Final  . Total Bilirubin 01/20/2019 0.5  0.3 - 1.2 mg/dL Final  . GFR calc non Af Amer 01/20/2019 >60  >60 mL/min Final  . GFR calc Af Amer 01/20/2019 >60  >60 mL/min Final  . Anion gap 01/20/2019 8  5 - 15 Final   Performed at [facility], [address] [place], [place] [address]  . WBC 01/20/2019 7.0  4.0 - 10.5 K/uL Final  . RBC 01/20/2019 3.35* 3.87 - 5.11 MIL/uL Final  . Hemoglobin 01/20/2019 10.3* 12.0 - 15.0 g/dL Final  . HCT 01/20/2019 32.4* 36.0 - 46.0 % Final  . MCV 01/20/2019 96.7  80.0 - 100.0 fL Final  . MCH 01/20/2019 30.7  26.0 - 34.0 pg Final  . MCHC 01/20/2019 31.8  30.0 - 36.0 g/dL Final  . RDW 01/20/2019 13.7  11.5 - 15.5 % Final  . Platelets 01/20/2019 252  150 - 400 K/uL Final  . nRBC 01/20/2019 0.0  0.0 - 0.2 % Final  . Neutrophils Relative % 01/20/2019 72  % Final  . Neutro Abs 01/20/2019 5.2  1.7 - 7.7 K/uL Final  . Lymphocytes Relative 01/20/2019 21  % Final  . Lymphs Abs 01/20/2019 1.5  0.7 - 4.0 K/uL Final  . Monocytes Relative 01/20/2019 4  % Final  . Monocytes Absolute 01/20/2019 0.3  0.1 - 1.0 K/uL Final  . Eosinophils Relative 01/20/2019 2  % Final  . Eosinophils Absolute 01/20/2019 0.1  0.0 - 0.5 K/uL Final  . Basophils Relative 01/20/2019 1  % Final  . Basophils Absolute 01/20/2019 0.0  0.0 - 0.1 K/uL Final  . Immature Granulocytes 01/20/2019 0  % Final  . Abs Immature Granulocytes  01/20/2019 0.02  0.00 -  0.07 K/uL Final   Performed at [facility], [address], [place], [place] [address]    Assessment:  Bridget Romero is a 73 y.o. 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 She receives IVIG monthly , azathioprine, and prednisone 5 mg a day.  CK has been followed:  > 5555 on 05/30/2017, 4409 on 06/20/2017, 3546 on 07/05/2017, 2725 on 07/30/2017, 3675 on 08/28/2017, 2405 on 10/08/2017, 2743 on 11/08/2017, 2723 on 12/20/2017, 723 on 01/21/2018, 507 on 02/21/2018, 2192 on 04/11/2018, 2227 on 06/04/2018, 4468 on 07/22/2018, 2204 on 09/06/2018, 1360 on 12/02/2018, and 1423 on 01/01/2019.  She has received IVIG (Privigen) 75 gm x 2 days every 4 weeks beginning 12/07/2017 (last 2 day infusion 12/30-12/31/2019).   She has a normocytic anemia.  Baseline hemoglobin is 10.  Last colonoscopy on 03/07/2016 revealed grade I hemorrhoids.  She has B12 deficiency and is on oral B12.  B12 was 306 and folate > 22.3 on 05/30/2017.   She has chronic mild elevation in LFTs.  Symptomatically, she notes back strain after lifting.  Exam is unremarkable.  Plan: 1.   Labs today:  CBC with diff, CMP. 2.   Polymyositis  Review entire medical history, diagnosis and management to date.  IVIG (Privigen) 75 gm x 2 days every 4 weeks  She receives Solumedrol 40 mg IV, Benadryl 25 mg po, and Tylenol 650 mg po with each infusion. 3.   IVIG today and tomorrow. 4.   RTC in 4 weeks for MD assessment, labs (CBC with diff, CMP), and IVIG (days 1-2).   Lequita Asal, MD  01/20/2019, 4:35 PM

## 2019-01-20 ENCOUNTER — Inpatient Hospital Stay: Payer: Medicare Other

## 2019-01-20 ENCOUNTER — Inpatient Hospital Stay: Payer: Medicare Other | Attending: Hematology and Oncology | Admitting: Hematology and Oncology

## 2019-01-20 ENCOUNTER — Encounter: Payer: Self-pay | Admitting: Hematology and Oncology

## 2019-01-20 VITALS — BP 178/75 | HR 70 | Temp 97.1°F | Resp 18

## 2019-01-20 VITALS — BP 145/65 | HR 76 | Temp 98.3°F | Resp 18 | Ht 65.0 in | Wt 179.3 lb

## 2019-01-20 DIAGNOSIS — I10 Essential (primary) hypertension: Secondary | ICD-10-CM | POA: Diagnosis not present

## 2019-01-20 DIAGNOSIS — M545 Low back pain, unspecified: Secondary | ICD-10-CM

## 2019-01-20 DIAGNOSIS — M3320 Polymyositis, organ involvement unspecified: Secondary | ICD-10-CM

## 2019-01-20 DIAGNOSIS — D649 Anemia, unspecified: Secondary | ICD-10-CM

## 2019-01-20 DIAGNOSIS — R7989 Other specified abnormal findings of blood chemistry: Secondary | ICD-10-CM | POA: Diagnosis not present

## 2019-01-20 DIAGNOSIS — E538 Deficiency of other specified B group vitamins: Secondary | ICD-10-CM | POA: Diagnosis not present

## 2019-01-20 LAB — COMPREHENSIVE METABOLIC PANEL
ALT: 52 U/L — ABNORMAL HIGH (ref 0–44)
AST: 52 U/L — ABNORMAL HIGH (ref 15–41)
Albumin: 3.7 g/dL (ref 3.5–5.0)
Alkaline Phosphatase: 40 U/L (ref 38–126)
Anion gap: 8 (ref 5–15)
BUN: 18 mg/dL (ref 8–23)
CO2: 27 mmol/L (ref 22–32)
Calcium: 9.1 mg/dL (ref 8.9–10.3)
Chloride: 104 mmol/L (ref 98–111)
Creatinine, Ser: 0.54 mg/dL (ref 0.44–1.00)
GFR calc Af Amer: >60 mL/min (ref >60)
GFR calc non Af Amer: >60 mL/min (ref >60)
Glucose, Bld: 157 mg/dL — ABNORMAL HIGH (ref 70–99)
Potassium: 3.8 mmol/L (ref 3.5–5.1)
Sodium: 139 mmol/L (ref 135–145)
Total Bilirubin: 0.5 mg/dL (ref 0.3–1.2)
Total Protein: 6.9 g/dL (ref 6.5–8.1)

## 2019-01-20 LAB — CBC WITH DIFFERENTIAL/PLATELET
Abs Immature Granulocytes: 0.02 10*3/uL (ref 0.00–0.07)
Basophils Absolute: 0 10*3/uL (ref 0.0–0.1)
Basophils Relative: 1 %
Eosinophils Absolute: 0.1 10*3/uL (ref 0.0–0.5)
Eosinophils Relative: 2 %
HCT: 32.4 % — ABNORMAL LOW (ref 36.0–46.0)
Hemoglobin: 10.3 g/dL — ABNORMAL LOW (ref 12.0–15.0)
Immature Granulocytes: 0 %
Lymphocytes Relative: 21 %
Lymphs Abs: 1.5 10*3/uL (ref 0.7–4.0)
MCH: 30.7 pg (ref 26.0–34.0)
MCHC: 31.8 g/dL (ref 30.0–36.0)
MCV: 96.7 fL (ref 80.0–100.0)
Monocytes Absolute: 0.3 10*3/uL (ref 0.1–1.0)
Monocytes Relative: 4 %
Neutro Abs: 5.2 10*3/uL (ref 1.7–7.7)
Neutrophils Relative %: 72 %
Platelets: 252 10*3/uL (ref 150–400)
RBC: 3.35 MIL/uL — ABNORMAL LOW (ref 3.87–5.11)
RDW: 13.7 % (ref 11.5–15.5)
WBC: 7 10*3/uL (ref 4.0–10.5)
nRBC: 0 % (ref 0.0–0.2)

## 2019-01-20 MED ORDER — DIPHENHYDRAMINE HCL 25 MG PO CAPS
ORAL_CAPSULE | ORAL | Status: AC
  Filled 2019-01-20: qty 1

## 2019-01-20 MED ORDER — METHYLPREDNISOLONE SODIUM SUCC 125 MG IJ SOLR
40.0000 mg | INTRAMUSCULAR | Status: DC
  Administered 2019-01-20: 40 mg via INTRAVENOUS
  Filled 2019-01-20: qty 2

## 2019-01-20 MED ORDER — ACETAMINOPHEN 325 MG PO TABS
650.0000 mg | ORAL_TABLET | Freq: Once | ORAL | Status: AC
  Administered 2019-01-20: 650 mg via ORAL
  Filled 2019-01-20: qty 2

## 2019-01-20 MED ORDER — DEXTROSE 5 % IV SOLN
Freq: Once | INTRAVENOUS | Status: AC
  Administered 2019-01-20: 09:00:00 via INTRAVENOUS
  Filled 2019-01-20: qty 250

## 2019-01-20 MED ORDER — DIPHENHYDRAMINE HCL 25 MG PO CAPS
25.0000 mg | ORAL_CAPSULE | Freq: Every day | ORAL | Status: DC
  Administered 2019-01-20: 25 mg via ORAL
  Filled 2019-01-20: qty 1

## 2019-01-20 MED ORDER — DEXTROSE 5 % IV SOLN
Freq: Once | INTRAVENOUS | Status: DC
  Filled 2019-01-20: qty 250

## 2019-01-20 MED ORDER — IMMUNE GLOBULIN (HUMAN) 5 GM/50ML IV SOLN
75.0000 g | Freq: Once | INTRAVENOUS | Status: AC
  Administered 2019-01-20: 75 g via INTRAVENOUS
  Filled 2019-01-20: qty 50

## 2019-01-21 ENCOUNTER — Inpatient Hospital Stay: Payer: Medicare Other

## 2019-01-21 VITALS — BP 164/81 | HR 66 | Temp 98.1°F | Resp 16

## 2019-01-21 DIAGNOSIS — M3320 Polymyositis, organ involvement unspecified: Secondary | ICD-10-CM | POA: Diagnosis not present

## 2019-01-21 MED ORDER — IMMUNE GLOBULIN (HUMAN) 5 GM/50ML IV SOLN
75.0000 g | Freq: Once | INTRAVENOUS | Status: AC
  Administered 2019-01-21: 75 g via INTRAVENOUS
  Filled 2019-01-21: qty 50

## 2019-01-21 MED ORDER — METHYLPREDNISOLONE SODIUM SUCC 125 MG IJ SOLR
40.0000 mg | INTRAMUSCULAR | Status: DC
  Administered 2019-01-21: 09:00:00 via INTRAVENOUS
  Filled 2019-01-21: qty 2

## 2019-01-21 MED ORDER — DIPHENHYDRAMINE HCL 25 MG PO CAPS
25.0000 mg | ORAL_CAPSULE | Freq: Once | ORAL | Status: AC
  Administered 2019-01-21: 25 mg via ORAL
  Filled 2019-01-21: qty 1

## 2019-01-21 MED ORDER — DIPHENHYDRAMINE HCL 25 MG PO CAPS
ORAL_CAPSULE | ORAL | Status: AC
  Filled 2019-01-21: qty 1

## 2019-01-21 MED ORDER — DEXTROSE 5 % IV SOLN
Freq: Once | INTRAVENOUS | Status: AC
  Administered 2019-01-21: 09:00:00 via INTRAVENOUS
  Filled 2019-01-21: qty 250

## 2019-01-21 MED ORDER — ACETAMINOPHEN 325 MG PO TABS
650.0000 mg | ORAL_TABLET | Freq: Once | ORAL | Status: AC
  Administered 2019-01-21: 650 mg via ORAL
  Filled 2019-01-21: qty 2

## 2019-01-22 ENCOUNTER — Ambulatory Visit
Admission: RE | Admit: 2019-01-22 | Discharge: 2019-01-22 | Disposition: A | Discharge status: Home / Self Care | Payer: Medicare Other | Source: Ambulatory Visit | Attending: Rheumatology | Admitting: Rheumatology

## 2019-01-22 DIAGNOSIS — M545 Low back pain: Secondary | ICD-10-CM | POA: Diagnosis not present

## 2019-02-13 ENCOUNTER — Other Ambulatory Visit: Payer: Self-pay

## 2019-02-13 DIAGNOSIS — M3320 Polymyositis, organ involvement unspecified: Secondary | ICD-10-CM

## 2019-02-16 NOTE — Progress Notes (Signed)
[facility] day:  02/17/2019   Chief Complaint: Bridget Romero is a 73 y.o. female with polymyositis who is seen for 1 month assessment and continuation of IVIG.  HPI:  The patient was last seen in the medical oncology clinic on 01/20/2019.  At that time, she noted back strain after lifting.  Exam was stable.  She received IVIG x 2 days.  Lumbar spine MRI on 01/22/2019 revealed no acute abnormality or significant interval change from 2018.  There was mild subarticular narrowing bilaterally at L4-5, potential contact of the traversing left S1 nerve root at L5-S1, and mild foraminal narrowing bilaterally at L5-S1, stable.  During the interim, she notes cramps and a tight sensation in her left calf and buttocks.  She states that her strength is good.  She denies any lower extremity swelling or shortness of breath.  She sees Dr [name] next week.   Past Medical History:  Diagnosis Date  . Allergy   . Asthma   . Diabetes mellitus   . Hyperlipidemia   . Hypertension   . Neuromuscular disorder (HCC)    polymyositis  . Osteoarthritis   . Vitamin B12 deficiency     Past Surgical History:  Procedure Laterality Date  . COLONOSCOPY WITH PROPOFOL N/A 03/07/2016   Procedure: COLONOSCOPY WITH PROPOFOL;  Surgeon: Lollie Sails, MD;  Location: [facility] ENDOSCOPY;  Service: Endoscopy;  Laterality: N/A;    Family History  Problem Relation Age of Onset  . Heart disease Mother   . Cancer Father     Social History:  reports that she has quit smoking. She has never used smokeless tobacco. She reports that she does not drink alcohol or use drugs.  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  Allergies:  Allergies  Allergen Reactions  . Atorvastatin Other (See Comments)    myalgias  . Pravastatin Other (See Comments)    Muscle pain    Current Medications: Current Outpatient Medications  Medication Sig Dispense Refill   . ACCU-CHEK FASTCLIX LANCETS MISC Use to test blood sugar twice daily dx: 250.02 100 each 3  . ACCU-CHEK SMARTVIEW test strip CHECK BLOOD SUGAR TWICE A  DAY AND AS DIRECTED 200 each 3  . acetaminophen (TYLENOL) 500 MG tablet Take 500 mg by mouth every 6 (six) hours as needed.    Marland Kitchen albuterol (PROVENTIL HFA;VENTOLIN HFA) 108 (90 BASE) MCG/ACT inhaler Inhale 2 puffs into the lungs every 6 (six) hours as needed for wheezing or shortness of breath. 2 Inhaler 3  . Alcohol Swabs (B-D SINGLE USE SWABS REGULAR) PADS Use to test blood sugar once daily dx: 250.00 100 each 3  . alendronate (FOSAMAX) 70 MG tablet TAKE 1 TABLET BY MOUTH EVERY 7 DAYS WITH A FULL GLASS OF WATER, DO NOT LIE DOWN FOR THE NEXT 30 MINUTES 12 tablet 3  . azaTHIOprine (IMURAN) 50 MG tablet take 3.5 tablets by mouth once daily (Patient taking differently: Take 100 mg by mouth daily. take 3.5 tablets by mouth once daily) 105 tablet 0  . Cholecalciferol (VITAMIN D) 1000 UNITS capsule Take 1,000 Units by mouth daily.      . fluticasone (FLOVENT HFA) 110 MCG/ACT inhaler Inhale 2 puffs into the lungs daily. 12 g 3  . folic acid (FOLVITE) 1 MG tablet Take 1 mg by mouth daily.    Marland Kitchen lisinopril (PRINIVIL,ZESTRIL) 10 MG tablet TAKE 1 TABLET BY MOUTH EVERY DAY 90  tablet 1  . metFORMIN (GLUCOPHAGE) 1000 MG tablet TAKE 1 TABLET BY MOUTH TWICE DAILY 60 tablet 11  . Multiple Vitamin (MULTIVITAMIN) tablet Take 1 tablet by mouth daily.      . predniSONE (DELTASONE) 5 MG tablet Take 5 mg by mouth daily with breakfast.    . tiZANidine (ZANAFLEX) 4 MG tablet Take 1 tablet by mouth at bedtime as needed.  0   No current facility-administered medications for this visit.     Review of Systems  Constitutional: Negative.  Negative for chills, fever, malaise/fatigue and weight loss.  HENT: Negative.  Negative for congestion, ear discharge, ear pain, nosebleeds, sinus pain, sore throat and tinnitus.   Eyes: Negative.  Negative for blurred vision, double  vision, photophobia and pain.  Respiratory: Negative.  Negative for cough, hemoptysis, sputum production and shortness of breath.   Cardiovascular: Negative.  Negative for chest pain, palpitations, orthopnea, claudication, leg swelling and PND.  Gastrointestinal: Negative.  Negative for abdominal pain, blood in stool, constipation, diarrhea, melena, nausea and vomiting.  Genitourinary: Negative.  Negative for dysuria, flank pain, frequency, hematuria and urgency.  Musculoskeletal: Negative for back pain, falls, joint pain, myalgias and neck pain.       Left calf and buttocks tightness and cramps.  Skin: Negative.  Negative for itching and rash.  Neurological: Negative.  Negative for dizziness, tingling, tremors, sensory change, speech change, focal weakness, weakness and headaches.  Endo/Heme/Allergies: Negative.  Does not bruise/bleed easily.       Diabetes.  Psychiatric/Behavioral: Negative.  Negative for depression and hallucinations. The patient is not nervous/anxious and does not have insomnia.     Vital signs: Blood pressure (!) 163/72, pulse 78, temperature 97.6 F (36.4 C), temperature source Oral, resp. rate 16, weight 181 lb 15.8 oz (82.6 kg), SpO2 100 %.  Physical Exam  Constitutional: She is oriented to person, place, and time. She appears well-developed and well-nourished. No distress.  HENT:  Head: Atraumatic.  Mouth/Throat: No oropharyngeal exudate.  Wearing a beret.  Short brown hair.  Eyes: Pupils are equal, round, and reactive to light. Conjunctivae are normal. No scleral icterus.  Brown eyes.  Neck: Normal range of motion. Neck supple. No JVD present.  Cardiovascular: Normal rate, regular rhythm and normal heart sounds. Exam reveals no gallop and no friction rub.  No murmur heard. Pulmonary/Chest: Effort normal and breath sounds normal. No respiratory distress. She has no wheezes. She has no rales.  Abdominal: Soft. Bowel sounds are normal. She exhibits no distension  and no mass. There is no abdominal tenderness. There is no rebound and no guarding.  Musculoskeletal:        General: No tenderness, deformity or edema.  Lymphadenopathy:    She has no cervical adenopathy.  Neurological: She is oriented to person, place, and time. Coordination normal.  Lower extremity strength symmetric.  Skin: Skin is dry. No rash noted. She is not diaphoretic. No erythema. No pallor.  Psychiatric: She has a normal mood and affect. Her behavior is normal. Judgment and thought content normal.  Nursing note and vitals reviewed.    Appointment on 02/17/2019  Component Date Value Ref Range Status  . Sodium 02/17/2019 138  135 - 145 mmol/L Final  . Potassium 02/17/2019 3.9  3.5 - 5.1 mmol/L Final  . Chloride 02/17/2019 102  98 - 111 mmol/L Final  . CO2 02/17/2019 25  22 - 32 mmol/L Final  . Glucose, Bld 02/17/2019 206* 70 - 99 mg/dL Final  . BUN 02/17/2019  15  8 - 23 mg/dL Final  . Creatinine, Ser 02/17/2019 0.65  0.44 - 1.00 mg/dL Final  . Calcium 02/17/2019 9.1  8.9 - 10.3 mg/dL Final  . Total Protein 02/17/2019 7.1  6.5 - 8.1 g/dL Final  . Albumin 02/17/2019 3.7  3.5 - 5.0 g/dL Final  . AST 02/17/2019 53* 15 - 41 U/L Final  . ALT 02/17/2019 54* 0 - 44 U/L Final  . Alkaline Phosphatase 02/17/2019 41  38 - 126 U/L Final  . Total Bilirubin 02/17/2019 0.7  0.3 - 1.2 mg/dL Final  . GFR calc non Af Amer 02/17/2019 >60  >60 mL/min Final  . GFR calc Af Amer 02/17/2019 >60  >60 mL/min Final  . Anion gap 02/17/2019 11  5 - 15 Final   Performed at [facility], [address], [place], [place] [address]  . WBC 02/17/2019 6.9  4.0 - 10.5 K/uL Final  . RBC 02/17/2019 3.42* 3.87 - 5.11 MIL/uL Final  . Hemoglobin 02/17/2019 10.5* 12.0 - 15.0 g/dL Final  . HCT 02/17/2019 32.8* 36.0 - 46.0 % Final  . MCV 02/17/2019 95.9  80.0 - 100.0 fL Final  . MCH 02/17/2019 30.7  26.0 - 34.0 pg Final  . MCHC 02/17/2019 32.0  30.0 - 36.0 g/dL Final  . RDW 02/17/2019 13.4  11.5 -  15.5 % Final  . Platelets 02/17/2019 232  150 - 400 K/uL Final  . nRBC 02/17/2019 0.0  0.0 - 0.2 % Final  . Neutrophils Relative % 02/17/2019 77  % Final  . Neutro Abs 02/17/2019 5.3  1.7 - 7.7 K/uL Final  . Lymphocytes Relative 02/17/2019 17  % Final  . Lymphs Abs 02/17/2019 1.2  0.7 - 4.0 K/uL Final  . Monocytes Relative 02/17/2019 4  % Final  . Monocytes Absolute 02/17/2019 0.3  0.1 - 1.0 K/uL Final  . Eosinophils Relative 02/17/2019 2  % Final  . Eosinophils Absolute 02/17/2019 0.1  0.0 - 0.5 K/uL Final  . Basophils Relative 02/17/2019 0  % Final  . Basophils Absolute 02/17/2019 0.0  0.0 - 0.1 K/uL Final  . Immature Granulocytes 02/17/2019 0  % Final  . Abs Immature Granulocytes 02/17/2019 0.03  0.00 - 0.07 K/uL Final   Performed at [facility], [address], [place], [place] [address]    Assessment:  GLADIOLA MADORE is a 73 y.o. 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 She receives IVIG monthly , azathioprine, and prednisone 5 mg a day.  CK has been followed:  > 5555 on 05/30/2017, 4409 on 06/20/2017, 3546 on 07/05/2017, 2725 on 07/30/2017, 3675 on 08/28/2017, 2405 on 10/08/2017, 2743 on 11/08/2017, 2723 on 12/20/2017, 723 on 01/21/2018, 507 on 02/21/2018, 2192 on 04/11/2018, 2227 on 06/04/2018, 4468 on 07/22/2018, 2204 on 09/06/2018, 1360 on 12/02/2018, and 1423 on 01/01/2019.  She has received IVIG (Privigen) 75 gm x 2 days every 4 weeks beginning 12/07/2017 (last 2 day infusion 01/27-01/28/2020).   She has a normocytic anemia.  Baseline hemoglobin is 10.  Last colonoscopy on 03/07/2016 revealed grade I hemorrhoids.  She has B12 deficiency and is on oral B12.  B12 was 306 and folate > 22.3 on 05/30/2017.   She has chronic mild elevation in LFTs.  Symptomatically, she notes some cramps and tightness in her left calf and buttock.  Exam is stable.  AST is 53 and ALT 54.  Plan: 1.   Labs today: CBC with diff, CMP. 2.   Polymyositis  CK 1423 on 01/01/2019.  Continue  IVIG  (Privigen) 75 gm x 2 days.  Premedications:  Solumedrol 40 mg IV, Benadryl 25 mg po, and Tylenol 650 mg po.  Follow-up as scheduled with Dr. [name] regarding ongoing treatment. 3.   IVIG today and tomorrow. 4.   RTC in 4 weeks for MD assessment, labs (CBC with diff, CMP), and IVIG x 2 days.   Lequita Asal, MD  02/17/2019, 8:41 AM

## 2019-02-17 ENCOUNTER — Inpatient Hospital Stay: Payer: Medicare Other

## 2019-02-17 ENCOUNTER — Inpatient Hospital Stay: Payer: Medicare Other | Attending: Hematology and Oncology | Admitting: Hematology and Oncology

## 2019-02-17 ENCOUNTER — Telehealth: Payer: Self-pay

## 2019-02-17 VITALS — BP 171/84 | HR 73 | Temp 95.7°F | Resp 18

## 2019-02-17 VITALS — BP 163/72 | HR 78 | Temp 97.6°F | Resp 16 | Wt 182.0 lb

## 2019-02-17 DIAGNOSIS — M3320 Polymyositis, organ involvement unspecified: Secondary | ICD-10-CM

## 2019-02-17 DIAGNOSIS — J45909 Unspecified asthma, uncomplicated: Secondary | ICD-10-CM

## 2019-02-17 DIAGNOSIS — Z7951 Long term (current) use of inhaled steroids: Secondary | ICD-10-CM

## 2019-02-17 DIAGNOSIS — E785 Hyperlipidemia, unspecified: Secondary | ICD-10-CM | POA: Diagnosis not present

## 2019-02-17 DIAGNOSIS — E538 Deficiency of other specified B group vitamins: Secondary | ICD-10-CM

## 2019-02-17 DIAGNOSIS — E119 Type 2 diabetes mellitus without complications: Secondary | ICD-10-CM

## 2019-02-17 DIAGNOSIS — I10 Essential (primary) hypertension: Secondary | ICD-10-CM

## 2019-02-17 DIAGNOSIS — Z794 Long term (current) use of insulin: Secondary | ICD-10-CM

## 2019-02-17 DIAGNOSIS — Z79899 Other long term (current) drug therapy: Secondary | ICD-10-CM

## 2019-02-17 DIAGNOSIS — Z87891 Personal history of nicotine dependence: Secondary | ICD-10-CM

## 2019-02-17 LAB — CBC WITH DIFFERENTIAL/PLATELET
Abs Immature Granulocytes: 0.03 10*3/uL (ref 0.00–0.07)
Basophils Absolute: 0 10*3/uL (ref 0.0–0.1)
Basophils Relative: 0 %
Eosinophils Absolute: 0.1 10*3/uL (ref 0.0–0.5)
Eosinophils Relative: 2 %
HCT: 32.8 % — ABNORMAL LOW (ref 36.0–46.0)
Hemoglobin: 10.5 g/dL — ABNORMAL LOW (ref 12.0–15.0)
Immature Granulocytes: 0 %
Lymphocytes Relative: 17 %
Lymphs Abs: 1.2 10*3/uL (ref 0.7–4.0)
MCH: 30.7 pg (ref 26.0–34.0)
MCHC: 32 g/dL (ref 30.0–36.0)
MCV: 95.9 fL (ref 80.0–100.0)
Monocytes Absolute: 0.3 10*3/uL (ref 0.1–1.0)
Monocytes Relative: 4 %
Neutro Abs: 5.3 10*3/uL (ref 1.7–7.7)
Neutrophils Relative %: 77 %
Platelets: 232 10*3/uL (ref 150–400)
RBC: 3.42 MIL/uL — ABNORMAL LOW (ref 3.87–5.11)
RDW: 13.4 % (ref 11.5–15.5)
WBC: 6.9 10*3/uL (ref 4.0–10.5)
nRBC: 0 % (ref 0.0–0.2)

## 2019-02-17 LAB — COMPREHENSIVE METABOLIC PANEL
ALT: 54 U/L — ABNORMAL HIGH (ref 0–44)
AST: 53 U/L — ABNORMAL HIGH (ref 15–41)
Albumin: 3.7 g/dL (ref 3.5–5.0)
Alkaline Phosphatase: 41 U/L (ref 38–126)
Anion gap: 11 (ref 5–15)
BUN: 15 mg/dL (ref 8–23)
CO2: 25 mmol/L (ref 22–32)
Calcium: 9.1 mg/dL (ref 8.9–10.3)
Chloride: 102 mmol/L (ref 98–111)
Creatinine, Ser: 0.65 mg/dL (ref 0.44–1.00)
GFR calc Af Amer: >60 mL/min (ref >60)
GFR calc non Af Amer: >60 mL/min (ref >60)
Glucose, Bld: 206 mg/dL — ABNORMAL HIGH (ref 70–99)
Potassium: 3.9 mmol/L (ref 3.5–5.1)
Sodium: 138 mmol/L (ref 135–145)
Total Bilirubin: 0.7 mg/dL (ref 0.3–1.2)
Total Protein: 7.1 g/dL (ref 6.5–8.1)

## 2019-02-17 MED ORDER — ATORVASTATIN CALCIUM 10 MG PO TABS: 10.0000 mg | ORAL_TABLET | Freq: Every day | ORAL | 3 refills | Status: DC

## 2019-02-17 MED ORDER — DEXTROSE 5 % IV SOLN
Freq: Once | INTRAVENOUS | Status: AC
  Administered 2019-02-17: 09:00:00 via INTRAVENOUS
  Filled 2019-02-17: qty 250

## 2019-02-17 MED ORDER — METHYLPREDNISOLONE SODIUM SUCC 125 MG IJ SOLR
40.0000 mg | INTRAMUSCULAR | Status: DC
  Administered 2019-02-17: 40 mg via INTRAVENOUS
  Filled 2019-02-17: qty 2

## 2019-02-17 MED ORDER — IMMUNE GLOBULIN (HUMAN) 5 GM/50ML IV SOLN
75.0000 g | Freq: Once | INTRAVENOUS | Status: AC
  Administered 2019-02-17: 75 g via INTRAVENOUS
  Filled 2019-02-17: qty 50

## 2019-02-17 MED ORDER — DIPHENHYDRAMINE HCL 25 MG PO CAPS
ORAL_CAPSULE | ORAL | Status: AC
  Filled 2019-02-17: qty 1

## 2019-02-17 MED ORDER — DIPHENHYDRAMINE HCL 25 MG PO CAPS
25.0000 mg | ORAL_CAPSULE | Freq: Once | ORAL | Status: AC
  Administered 2019-02-17: 25 mg via ORAL
  Filled 2019-02-17: qty 1

## 2019-02-17 MED ORDER — ACETAMINOPHEN 325 MG PO TABS
650.0000 mg | ORAL_TABLET | Freq: Once | ORAL | Status: AC
  Administered 2019-02-17: 650 mg via ORAL
  Filled 2019-02-17: qty 2

## 2019-02-17 NOTE — Progress Notes (Signed)
Pt here for follow up. Reports Reports left calf tightness and cramping x couple of weeks. Reports Tylenol is helpful. Also reports pain in left buttocks area.

## 2019-02-17 NOTE — Telephone Encounter (Signed)
landon with [facility] left v/m requesting refill for atorvastatin 10 mg ; pt is new customer for [facility]. I verified with Gerald Stabs at [facility] that pt may have gotten atorvastatin from Dr Precious Reel rheumatologist. Gerald Stabs also sent paper fax for refill for Azathioprine 50 mg.Please advise.

## 2019-02-17 NOTE — Telephone Encounter (Signed)
Rx sent electronically for atorvastatin with a note about the azathioprine.

## 2019-02-17 NOTE — Telephone Encounter (Signed)
Okay to refill atorvastatin for a year but the azathioprine is definitely from Dr [name]

## 2019-02-18 ENCOUNTER — Inpatient Hospital Stay: Payer: Medicare Other

## 2019-02-18 VITALS — BP 169/81 | HR 79 | Temp 96.0°F | Resp 18

## 2019-02-18 DIAGNOSIS — M3320 Polymyositis, organ involvement unspecified: Secondary | ICD-10-CM

## 2019-02-18 MED ORDER — ALTEPLASE 2 MG IJ SOLR: 2.0000 mg | Freq: Once | INTRAMUSCULAR | Status: DC | PRN

## 2019-02-18 MED ORDER — IMMUNE GLOBULIN (HUMAN) 5 GM/50ML IV SOLN
75.0000 g | Freq: Once | INTRAVENOUS | Status: AC
  Administered 2019-02-18: 75 g via INTRAVENOUS
  Filled 2019-02-18: qty 50

## 2019-02-18 MED ORDER — SODIUM CHLORIDE 0.9% FLUSH
3.0000 mL | Freq: Once | INTRAVENOUS | Status: DC | PRN
  Filled 2019-02-18: qty 3

## 2019-02-18 MED ORDER — HEPARIN SOD (PORK) LOCK FLUSH 100 UNIT/ML IV SOLN: 500.0000 [IU] | Freq: Once | INTRAVENOUS | Status: DC | PRN

## 2019-02-18 MED ORDER — ACETAMINOPHEN 325 MG PO TABS
650.0000 mg | ORAL_TABLET | Freq: Once | ORAL | Status: AC
  Administered 2019-02-18: 650 mg via ORAL
  Filled 2019-02-18: qty 2

## 2019-02-18 MED ORDER — HEPARIN SOD (PORK) LOCK FLUSH 100 UNIT/ML IV SOLN: 250.0000 [IU] | Freq: Once | INTRAVENOUS | Status: DC | PRN

## 2019-02-18 MED ORDER — DIPHENHYDRAMINE HCL 25 MG PO CAPS
25.0000 mg | ORAL_CAPSULE | Freq: Once | ORAL | Status: AC
  Administered 2019-02-18: 25 mg via ORAL
  Filled 2019-02-18: qty 1

## 2019-02-18 MED ORDER — DEXTROSE 5 % IV SOLN
Freq: Once | INTRAVENOUS | Status: AC
  Administered 2019-02-18: 09:00:00 via INTRAVENOUS
  Filled 2019-02-18: qty 250

## 2019-02-18 MED ORDER — SODIUM CHLORIDE 0.9% FLUSH
10.0000 mL | Freq: Once | INTRAVENOUS | Status: DC | PRN
  Filled 2019-02-18: qty 10

## 2019-02-18 MED ORDER — METHYLPREDNISOLONE SODIUM SUCC 125 MG IJ SOLR
40.0000 mg | INTRAMUSCULAR | Status: DC
  Administered 2019-02-18: 40 mg via INTRAVENOUS
  Filled 2019-02-18: qty 2

## 2019-02-18 MED ORDER — METHYLPREDNISOLONE SODIUM SUCC 125 MG IJ SOLR: 40.0000 mg | INTRAMUSCULAR | Status: DC

## 2019-03-07 ENCOUNTER — Telehealth: Payer: Self-pay | Admitting: Hematology and Oncology

## 2019-03-07 NOTE — Telephone Encounter (Signed)
Re:  Frequency of infusions  Dr [name] sent a message today regarding spreading out her IVIG from every month to every 2 months.  CK was 1423 on 01/01/2019 and 912 on 03/03/2019  Aldolase was 14.1 on 01/01/2019 and 8.8 on 03/03/2019.   Lequita Asal, MD

## 2019-03-17 ENCOUNTER — Ambulatory Visit: Payer: Medicare Other

## 2019-03-17 ENCOUNTER — Ambulatory Visit: Payer: Medicare Other | Admitting: Hematology and Oncology

## 2019-03-17 ENCOUNTER — Other Ambulatory Visit: Payer: Medicare Other

## 2019-03-18 ENCOUNTER — Ambulatory Visit: Payer: Medicare Other

## 2019-03-18 ENCOUNTER — Other Ambulatory Visit: Payer: Medicare Other

## 2019-03-18 ENCOUNTER — Ambulatory Visit: Payer: Medicare Other | Admitting: Hematology and Oncology

## 2019-03-24 ENCOUNTER — Other Ambulatory Visit: Payer: Self-pay

## 2019-03-24 NOTE — Patient Outreach (Signed)
[facility]) Care Management  03/24/2019  Bridget Romero 14-Mar-1946 [id number]   Medication Adherence call to Mrs. Bridget Romero patient did not answer patient is due on Metformin 1000 mg under [place],   [place] Management Direct Dial [phone]  Fax [phone] [email or web address]

## 2019-04-07 ENCOUNTER — Other Ambulatory Visit: Payer: Self-pay | Admitting: Internal Medicine

## 2019-04-09 ENCOUNTER — Ambulatory Visit (INDEPENDENT_AMBULATORY_CARE_PROVIDER_SITE_OTHER): Payer: Medicare Other | Admitting: Internal Medicine

## 2019-04-09 ENCOUNTER — Encounter: Payer: Self-pay | Admitting: Internal Medicine

## 2019-04-09 VITALS — Wt 174.0 lb

## 2019-04-09 DIAGNOSIS — M3320 Polymyositis, organ involvement unspecified: Secondary | ICD-10-CM | POA: Diagnosis not present

## 2019-04-09 DIAGNOSIS — E1140 Type 2 diabetes mellitus with diabetic neuropathy, unspecified: Secondary | ICD-10-CM

## 2019-04-09 DIAGNOSIS — Z794 Long term (current) use of insulin: Secondary | ICD-10-CM | POA: Diagnosis not present

## 2019-04-09 DIAGNOSIS — E1165 Type 2 diabetes mellitus with hyperglycemia: Secondary | ICD-10-CM

## 2019-04-09 DIAGNOSIS — IMO0002 Reserved for concepts with insufficient information to code with codable children: Secondary | ICD-10-CM

## 2019-04-09 LAB — POCT GLYCOSYLATED HEMOGLOBIN (HGB A1C): Hemoglobin A1C: 8.1 % — AB (ref 4.0–5.6)

## 2019-04-09 MED ORDER — INSULIN GLARGINE 100 UNIT/ML ~~LOC~~ SOLN: 10.0000 [IU] | Freq: Every day | SUBCUTANEOUS | 11 refills | Status: DC

## 2019-04-09 NOTE — Assessment & Plan Note (Signed)
Lab Results  Component Value Date   HGBA1C 8.1 (A) 04/09/2019   Control has slipped a lot Likely mostly due to lifestyle changes with the COVID pandemic, but also with some steroid Rx for apparent radiculopathy Still on metformin Will add back the lantus and titrate up---start at 10 units, then after a week, increase by an additional 2 units every other day till fasting sugars consistently under 140.Bridget Romero if she gets to 20 units and still not controlled, she will let me know Discussed need to be careful with eating and to increase her exercise

## 2019-04-09 NOTE — Assessment & Plan Note (Signed)
Recent CPK among the lowest she has had Continues on IVIG, azathioprine and low dose prednisone per Dr [name]

## 2019-04-09 NOTE — Progress Notes (Signed)
Subjective:    Patient ID: Bridget Romero, female    DOB: February 05, 1946, 73 y.o.   MRN: [id number]  HPI Visit for review of diabetes control and other chronic health conditions Unable to establish video conference after much effort  Interactive audio and video telecommunications were attempted between this provider and patient, however failed, due to patient having technical difficulties OR patient did not have access to video capability.  We continued and completed visit with audio only.   Virtual Visit via Telephone Note  I connected with Bridget Romero on 04/09/19 at  9:30 AM EDT by telephone and verified that I am speaking with the correct person using two identifiers.   I discussed the limitations, risks, security and privacy concerns of performing an evaluation and management service by telephone and the availability of in person appointments. I also discussed with the patient that there may be a patient responsible charge related to this service. The patient expressed understanding and agreed to proceed.   History of Present Illness: She is having problems with her sugars Staying at home--she is eating more than usual Not as active Checking once or twice a day--- 180-190s fasting mostly No change in leg sensory changes  Myositis has been acting up Getting medication for possible radicular pain--tight in left buttock and thigh Got steroid burst and is due for a epidural infection today Still on the 5mg  daily of her prednisone   sleeping fine No chest pain or SOB No dizziness or syncope  Current Outpatient Medications on File Prior to Visit  Medication Sig Dispense Refill  . ACCU-CHEK FASTCLIX LANCETS MISC Use to test blood sugar twice daily dx: 250.02 100 each 3  . acetaminophen (TYLENOL) 500 MG tablet Take 500 mg by mouth every 6 (six) hours as needed.    Marland Kitchen albuterol (PROVENTIL HFA;VENTOLIN HFA) 108 (90 BASE) MCG/ACT inhaler Inhale 2 puffs into the lungs every 6 (six)  hours as needed for wheezing or shortness of breath. 2 Inhaler 3  . Alcohol Swabs (B-D SINGLE USE SWABS REGULAR) PADS Use to test blood sugar once daily dx: 250.00 100 each 3  . alendronate (FOSAMAX) 70 MG tablet TAKE 1 TABLET BY MOUTH EVERY 7 DAYS WITH A FULL GLASS OF WATER, DO NOT LIE DOWN FOR THE NEXT 30 MINUTES 12 tablet 3  . atorvastatin (LIPITOR) 10 MG tablet Take 1 tablet (10 mg total) by mouth daily. 90 tablet 3  . azaTHIOprine (IMURAN) 50 MG tablet take 3.5 tablets by mouth once daily (Patient taking differently: Take 100 mg by mouth daily. take 3.5 tablets by mouth once daily) 105 tablet 0  . Cholecalciferol (VITAMIN D) 1000 UNITS capsule Take 1,000 Units by mouth daily.      . fluticasone (FLOVENT HFA) 110 MCG/ACT inhaler Inhale 2 puffs into the lungs daily. 12 g 3  . folic acid (FOLVITE) 1 MG tablet Take 1 mg by mouth daily.    Marland Kitchen gabapentin (NEURONTIN) 300 MG capsule Take 300 mg by mouth 2 (two) times daily.    Marland Kitchen glucose blood (ACCU-CHEK SMARTVIEW) test strip Use to check blood sugar twice a day Dx Code E11.4 100 each 6  . lisinopril (PRINIVIL,ZESTRIL) 10 MG tablet TAKE 1 TABLET BY MOUTH EVERY DAY 90 tablet 1  . metFORMIN (GLUCOPHAGE) 1000 MG tablet TAKE 1 TABLET BY MOUTH TWICE DAILY 60 tablet 11  . Multiple Vitamin (MULTIVITAMIN) tablet Take 1 tablet by mouth daily.      . predniSONE (DELTASONE) 5 MG tablet  Take 5 mg by mouth daily with breakfast.     No current facility-administered medications on file prior to visit.     Allergies  Allergen Reactions  . Atorvastatin Other (See Comments)    myalgias  . Pravastatin Other (See Comments)    Muscle pain    Past Medical History:  Diagnosis Date  . Allergy   . Asthma   . Diabetes mellitus   . Hyperlipidemia   . Hypertension   . Neuromuscular disorder (HCC)    polymyositis  . Osteoarthritis   . Vitamin B12 deficiency     Past Surgical History:  Procedure Laterality Date  . COLONOSCOPY WITH PROPOFOL N/A 03/07/2016    Procedure: COLONOSCOPY WITH PROPOFOL;  Surgeon: Lollie Sails, MD;  Location: [facility] ENDOSCOPY;  Service: Endoscopy;  Laterality: N/A;    Family History  Problem Relation Age of Onset  . Heart disease Mother   . Cancer Father     Social History   Socioeconomic History  . Marital status: Widowed    Spouse name: Not on file  . Number of children: 2  . Years of education: Not on file  . Highest education level: Not on file  Occupational History  . Occupation: Retired as Training and development officer at [employer]:    Social Needs  . Financial resource strain: Not on file  . Food insecurity:    Worry: Not on file    Inability: Not on file  . Transportation needs:    Medical: Not on file    Non-medical: Not on file  Tobacco Use  . Smoking status: Former Research scientist (life sciences)  . Smokeless tobacco: Never Used  . Tobacco comment: age 34 when stopped smoking  Substance and Sexual Activity  . Alcohol use: No  . Drug use: No  . Sexual activity: Not on file  Lifestyle  . Physical activity:    Days per week: Not on file    Minutes per session: Not on file  . Stress: Not on file  Relationships  . Social connections:    Talks on phone: Not on file    Gets together: Not on file    Attends religious service: Not on file    Active member of club or organization: Not on file    Attends meetings of clubs or organizations: Not on file    Relationship status: Not on file  . Intimate partner violence:    Fear of current or ex partner: Not on file    Emotionally abused: Not on file    Physically abused: Not on file    Forced sexual activity: Not on file  Other Topics Concern  . Not on file  Social History Narrative   No living will   Son and daughter should make health care decisions for her   Would accept resuscitation   Not sure about tube feeds   Observations/Objective: Normal speech and mood Insight seems fair---understands my instructions (I had her write them down)  Assessment  and Plan: See problem list  Follow Up Instructions:    I discussed the assessment and treatment plan with the patient. The patient was provided an opportunity to ask questions and all were answered. The patient agreed with the plan and demonstrated an understanding of the instructions.   The patient was advised to call back or seek an in-person evaluation if the symptoms worsen or if the condition fails to improve as anticipated.  I provided 13 minutes of non-face-to-face time during  this encounter.   Viviana Simpler, MD    Review of Systems     Objective:   Physical Exam         Assessment & Plan:

## 2019-04-10 ENCOUNTER — Telehealth: Payer: Self-pay | Admitting: *Deleted

## 2019-04-10 ENCOUNTER — Telehealth: Payer: Self-pay

## 2019-04-10 NOTE — Telephone Encounter (Signed)
Pt had visit on 04/09/19 and pt wanted to know if should continue the metformin since starting the lantus. Per instructions on 04/09/19 note that the lantus was added to pts medication regimen and pt is supposed to continue taking the metformin 1000 mg bid.[facility] CMA confirmed these instructions also. Pt voiced understanding and nothing further needed.

## 2019-04-10 NOTE — Telephone Encounter (Signed)
Called patient to discuss her upcoming appointments on Monday and Tuesday of next week. Discussed moving appointment days. Patient prefers to move her apponintments to Monday 4/27 and Tues 4/28. She will see MD on Monday with labwork and receive IVIG on 4/27 and 4/28. This changes have been changed and confirmed.

## 2019-04-14 ENCOUNTER — Inpatient Hospital Stay: Payer: No Typology Code available for payment source

## 2019-04-14 ENCOUNTER — Inpatient Hospital Stay: Payer: No Typology Code available for payment source | Admitting: Hematology and Oncology

## 2019-04-15 ENCOUNTER — Ambulatory Visit: Payer: Medicare Other | Admitting: Internal Medicine

## 2019-04-15 ENCOUNTER — Ambulatory Visit: Payer: Medicare Other

## 2019-04-16 ENCOUNTER — Other Ambulatory Visit: Payer: Self-pay | Admitting: Physical Medicine and Rehabilitation

## 2019-04-16 ENCOUNTER — Other Ambulatory Visit (HOSPITAL_COMMUNITY): Payer: Self-pay | Admitting: Physical Medicine and Rehabilitation

## 2019-04-16 DIAGNOSIS — M5414 Radiculopathy, thoracic region: Secondary | ICD-10-CM

## 2019-04-17 ENCOUNTER — Ambulatory Visit: Payer: No Typology Code available for payment source

## 2019-04-17 ENCOUNTER — Encounter: Payer: Self-pay | Admitting: Rheumatology

## 2019-04-20 ENCOUNTER — Encounter: Payer: Self-pay | Admitting: Hematology and Oncology

## 2019-04-20 ENCOUNTER — Other Ambulatory Visit: Payer: Self-pay

## 2019-04-20 NOTE — Progress Notes (Signed)
[facility]     [address]     [place], [place] [address]     Phone: [phone]      Fax: [phone]       Clinic day:  04/21/2019   Chief Complaint: Bridget Romero is a 73 y.o. female with polymyositis who is seen for 2 month assessment and continuation of IVIG.  HPI:  The patient was last seen in the medical oncology clinic on 02/17/2019.  At that time, she noted some cramps and tightness in her left calf and buttock.  Exam was stable.  AST was 53 and ALT 54.  She received IVIG on 02/17/2019 and 02/18/2019.  I spoke with Dr. [name] on 03/07/2019. CK was 1423 on 01/01/2019 and 912 on 03/03/2019.  Aldolase was 14.1 on 01/01/2019 and 8.8 on 03/03/2019.  IVIG was to be spread out from every month to every 2 months.  During the interim, she has done well.  She notes some lower abdominal discomfort the past 2 months.  She denies any nausea, vomiting, diarrhea, melena or hematochezia.  She denies any urgency frequency or dysuria.  She denies any fevers.  She has not sought any medical attention for this discomfort.   Past Medical History:  Diagnosis Date  . Allergy   . Asthma   . Diabetes mellitus   . Hyperlipidemia   . Hypertension   . Neuromuscular disorder (HCC)    polymyositis  . Osteoarthritis   . Vitamin B12 deficiency     Past Surgical History:  Procedure Laterality Date  . COLONOSCOPY WITH PROPOFOL N/A 03/07/2016   Procedure: COLONOSCOPY WITH PROPOFOL;  Surgeon: Lollie Sails, MD;  Location: [facility] ENDOSCOPY;  Service: Endoscopy;  Laterality: N/A;    Family History  Problem Relation Age of Onset  . Heart disease Mother   . Cancer Father     Social History:  reports that she has quit smoking. She has never used smokeless tobacco. She reports that she does not drink alcohol or use drugs.  She is a Training and development officer.  She lives in [place].  The patient is alone today.  Allergies:  Allergies  Allergen Reactions  . Atorvastatin  Other (See Comments)    myalgias  . Pravastatin Other (See Comments)    Muscle pain    Current Medications: Current Outpatient Medications  Medication Sig Dispense Refill  . ACCU-CHEK FASTCLIX LANCETS MISC Use to test blood sugar twice daily dx: 250.02 100 each 3  . acetaminophen (TYLENOL) 500 MG tablet Take 500 mg by mouth every 6 (six) hours as needed.    Marland Kitchen albuterol (PROVENTIL HFA;VENTOLIN HFA) 108 (90 BASE) MCG/ACT inhaler Inhale 2 puffs into the lungs every 6 (six) hours as needed for wheezing or shortness of breath. 2 Inhaler 3  . Alcohol Swabs (B-D SINGLE USE SWABS REGULAR) PADS Use to test blood sugar once daily dx: 250.00 100 each 3  . alendronate (FOSAMAX) 70 MG tablet TAKE 1 TABLET BY MOUTH EVERY 7 DAYS WITH A FULL GLASS OF WATER, DO NOT LIE DOWN FOR THE NEXT 30 MINUTES 12 tablet 3  . atorvastatin (LIPITOR) 10 MG tablet Take 1 tablet (10 mg total) by mouth daily. 90 tablet 3  . azaTHIOprine (IMURAN) 50 MG tablet take 3.5 tablets by mouth once daily (Patient taking differently: Take 125 mg by mouth daily. take 2.5 tablets by mouth once daily) 105 tablet 0  . Cholecalciferol (VITAMIN D) 1000 UNITS capsule Take 1,000 Units by  mouth daily.      . fluticasone (FLOVENT HFA) 110 MCG/ACT inhaler Inhale 2 puffs into the lungs daily. 12 g 3  . folic acid (FOLVITE) 1 MG tablet Take 1 mg by mouth daily.    Marland Kitchen gabapentin (NEURONTIN) 300 MG capsule Take 300 mg by mouth 2 (two) times daily.    Marland Kitchen glucose blood (ACCU-CHEK SMARTVIEW) test strip Use to check blood sugar twice a day Dx Code E11.4 100 each 6  . insulin glargine (LANTUS) 100 UNIT/ML injection Inject 0.1-0.2 mLs (10-20 Units total) into the skin daily. 10 mL 11  . lisinopril (PRINIVIL,ZESTRIL) 10 MG tablet TAKE 1 TABLET BY MOUTH EVERY DAY 90 tablet 1  . metFORMIN (GLUCOPHAGE) 1000 MG tablet TAKE 1 TABLET BY MOUTH TWICE DAILY 60 tablet 11  . Multiple Vitamin (MULTIVITAMIN) tablet Take 1 tablet by mouth daily.      . predniSONE (DELTASONE)  5 MG tablet Take 5 mg by mouth daily with breakfast.     No current facility-administered medications for this visit.     Review of Systems  Constitutional: Negative.  Negative for chills, diaphoresis, fever, malaise/fatigue and weight loss (up 1 pound).       Feels "pretty good".  HENT: Negative for congestion, ear discharge, hearing loss, nosebleeds, sinus pain and sore throat.   Eyes: Negative.  Negative for blurred vision, double vision, photophobia and pain.  Respiratory: Negative.  Negative for cough, hemoptysis, sputum production and shortness of breath.   Cardiovascular: Negative.  Negative for chest pain, palpitations, orthopnea, claudication, leg swelling and PND.  Gastrointestinal: Negative for abdominal pain, blood in stool, constipation, diarrhea, heartburn, melena, nausea and vomiting.       Lower abdominal discomfort x 2 months.  Genitourinary: Negative.  Negative for dysuria, flank pain, frequency, hematuria and urgency.  Musculoskeletal: Negative for back pain, falls, joint pain and neck pain.       Left calf and buttocks tightness, improved.  Skin: Negative.  Negative for itching.  Neurological: Negative.  Negative for dizziness, tingling, tremors, sensory change, speech change, focal weakness, weakness and headaches.  Endo/Heme/Allergies: Negative.  Does not bruise/bleed easily.       Diabetes.  Psychiatric/Behavioral: Negative.  Negative for depression and memory loss. The patient is not nervous/anxious and does not have insomnia.     Vital signs: Blood pressure 137/77, pulse 80, temperature 97.8 F (36.6 C), temperature source Oral, resp. rate 16, weight 182 lb 10.4 oz (82.9 kg), SpO2 100 %.  Physical Exam  Constitutional: She is oriented to person, place, and time. She appears well-developed and well-nourished. No distress.  HENT:  Head: Normocephalic and atraumatic.  Mouth/Throat: Oropharynx is clear and moist. No oropharyngeal exudate.  Curly black hair.   Eyes: Pupils are equal, round, and reactive to light. Conjunctivae are normal. No scleral icterus.  Brown eyes.  Neck: Normal range of motion. Neck supple. No JVD present.  Cardiovascular: Normal rate, regular rhythm and normal heart sounds. Exam reveals no gallop and no friction rub.  No murmur heard. Pulmonary/Chest: Effort normal and breath sounds normal. No respiratory distress. She has no wheezes. She has no rales.  Abdominal: Soft. Bowel sounds are normal. She exhibits no distension and no mass. There is abdominal tenderness (slight suprapubic). There is no rebound and no guarding.  Musculoskeletal:        General: No tenderness, deformity or edema.  Lymphadenopathy:    She has no cervical adenopathy.  Neurological: She is alert and oriented to person, place,  and time. Coordination normal.  Gait normal.  Skin: Skin is warm and dry. No rash noted. She is not diaphoretic. No erythema. No pallor.  Psychiatric: She has a normal mood and affect. Her behavior is normal. Judgment and thought content normal.  Nursing note and vitals reviewed.    Appointment on 04/21/2019  Component Date Value Ref Range Status  . WBC 04/21/2019 9.0  4.0 - 10.5 K/uL Final  . RBC 04/21/2019 3.52* 3.87 - 5.11 MIL/uL Final  . Hemoglobin 04/21/2019 10.7* 12.0 - 15.0 g/dL Final  . HCT 04/21/2019 33.6* 36.0 - 46.0 % Final  . MCV 04/21/2019 95.5  80.0 - 100.0 fL Final  . MCH 04/21/2019 30.4  26.0 - 34.0 pg Final  . MCHC 04/21/2019 31.8  30.0 - 36.0 g/dL Final  . RDW 04/21/2019 14.4  11.5 - 15.5 % Final  . Platelets 04/21/2019 273  150 - 400 K/uL Final  . nRBC 04/21/2019 0.0  0.0 - 0.2 % Final  . Neutrophils Relative % 04/21/2019 79  % Final  . Neutro Abs 04/21/2019 7.1  1.7 - 7.7 K/uL Final  . Lymphocytes Relative 04/21/2019 16  % Final  . Lymphs Abs 04/21/2019 1.5  0.7 - 4.0 K/uL Final  . Monocytes Relative 04/21/2019 3  % Final  . Monocytes Absolute 04/21/2019 0.3  0.1 - 1.0 K/uL Final  . Eosinophils  Relative 04/21/2019 1  % Final  . Eosinophils Absolute 04/21/2019 0.1  0.0 - 0.5 K/uL Final  . Basophils Relative 04/21/2019 0  % Final  . Basophils Absolute 04/21/2019 0.0  0.0 - 0.1 K/uL Final  . Immature Granulocytes 04/21/2019 1  % Final  . Abs Immature Granulocytes 04/21/2019 0.05  0.00 - 0.07 K/uL Final   Performed at [facility], [address], [place], [place] [address]  . Sodium 04/21/2019 136  135 - 145 mmol/L Final  . Potassium 04/21/2019 4.0  3.5 - 5.1 mmol/L Final  . Chloride 04/21/2019 102  98 - 111 mmol/L Final  . CO2 04/21/2019 27  22 - 32 mmol/L Final  . Glucose, Bld 04/21/2019 211* 70 - 99 mg/dL Final  . BUN 04/21/2019 19  8 - 23 mg/dL Final  . Creatinine, Ser 04/21/2019 0.81  0.44 - 1.00 mg/dL Final  . Calcium 04/21/2019 9.6  8.9 - 10.3 mg/dL Final  . Total Protein 04/21/2019 7.0  6.5 - 8.1 g/dL Final  . Albumin 04/21/2019 3.8  3.5 - 5.0 g/dL Final  . AST 04/21/2019 107* 15 - 41 U/L Final  . ALT 04/21/2019 128* 0 - 44 U/L Final  . Alkaline Phosphatase 04/21/2019 45  38 - 126 U/L Final  . Total Bilirubin 04/21/2019 0.6  0.3 - 1.2 mg/dL Final  . GFR calc non Af Amer 04/21/2019 >60  >60 mL/min Final  . GFR calc Af Amer 04/21/2019 >60  >60 mL/min Final  . Anion gap 04/21/2019 7  5 - 15 Final   Performed at [facility], [address] [place], [place] [address]  . Total CK 04/21/2019 3,955* 38 - 234 U/L Final   Performed at [facility], [address] [place], [place] [address]    Assessment:  CHANA LINDSTROM is a 73 y.o. 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 She receives IVIG monthly , azathioprine, and prednisone 5 mg a day.  CK has been followed:  > 5555 on 05/30/2017, 4409 on 06/20/2017, 3546 on 07/05/2017, 2725 on 07/30/2017, 3675 on 08/28/2017, 2405 on 10/08/2017, 2743 on  11/08/2017, 2723 on 12/20/2017, 723 on 01/21/2018, 507 on 02/21/2018, 2192 on 04/11/2018, 2227 on 06/04/2018, 4468 on 07/22/2018, 2204 on 09/06/2018, 1360 on  12/02/2018, 1423 on 01/01/2019, 912 on 03/03/2019, and 3955 on 04/21/2019.    Aldolase was 67.2 in 05/30/2017, 69.9 on 06/20/2017, 64.8 on 07/05/2017, 57.6 on 07/30/2017, 60.3 on 08/28/2017, 38.7 on 12/20/2017, 9.3 on 01/21/2018, 7.4 on 02/21/2018, 29.1 on 04/11/2018, to 51.9 on 07/22/2018, 23.7 on 09/06/2018, 16.4 on 12/02/2018, 14.1 on 01/01/2019 and 8.8 on 03/03/2019.  She received IVIG (Privigen) 75 gm x 2 days every 4 weeks beginning 12/07/2017 (last 2 day infusion 02/17/2019 - 02/18/2019). IVIG was decreased to every 8 weeks on 04/22/2019.  She has a normocytic anemia.  Baseline hemoglobin is 10.  Last colonoscopy on 03/07/2016 revealed grade I hemorrhoids.  She has B12 deficiency and is on oral B12.  B12 was 306 and folate > 22.3 on 05/30/2017.   She has chronic mild elevation in LFTs.  Hepatitis B and C testing were negative on 06/20/2017.  Symptomatically, she notes a 2 month history of lower abdominal discomfort.   LFTs have increased since last visit.    Plan: 1.   Labs today: CBC with diff, CMP, CPK. 2.   Polymyositis  CK 912 on 03/03/2019 and 3955 today.  Discussed with Dr [name].  Plan to return to monthly IVIG infusions.  Continue IVIG (Privigen) 75 gm x 2 days each month (rather than every other month).  Premedications:  Solumedrol 40 mg IV, Benadryl 25 mg po, and Tylenol 650 mg po.  Follow-up as scheduled with Dr. [name] regarding ongoing treatment. 3.   Elevated LFTs  AST 157 and ALT 243 on 05/30/2017.  AST   53 and ALT   54 on 02/17/2019.  AST 107 and ALT 128 on 04/21/2019.  Hepatitis serologies negative on 06/20/2017.  Increased LFTs correlate with increased CPK on 06/20/2017.  CPK reflects activity of polymyositis.  Discussed with Dr. [name].   Confirmed plan to proceed with IVIG. 4.   Lower abdominal discomfort  Discomfort mild and persistent x 2 month.  Etiology unclear.  Discuss follow-up with Dr. Silvio Pate. 5.   IVIG on 04/22/2019 and 04/23/2019. 6.   RTC  on 05/20/2019 for MD assessment, labs (CBC with diff, CMP, aldolase, CK), and IVIG x 2 days.  I discussed the assessment and treatment plan with the patient.  The patient was provided an opportunity to ask questions and all were answered.  The patient agreed with the plan and demonstrated an understanding of the instructions.    I provided 25 minutes of face-to-face time during this this encounter and > 50% was spent counseling as documented under my assessment and plan.  Care coordinated with Dr. [name].   Lequita Asal, MD  04/21/2019, 5:06 PM

## 2019-04-21 ENCOUNTER — Encounter: Payer: Self-pay | Admitting: Hematology and Oncology

## 2019-04-21 ENCOUNTER — Inpatient Hospital Stay: Payer: Medicare Other | Attending: Hematology and Oncology

## 2019-04-21 ENCOUNTER — Ambulatory Visit: Payer: Medicare Other | Admitting: Hematology and Oncology

## 2019-04-21 ENCOUNTER — Ambulatory Visit: Payer: Medicare Other

## 2019-04-21 ENCOUNTER — Other Ambulatory Visit: Payer: Medicare Other

## 2019-04-21 ENCOUNTER — Inpatient Hospital Stay: Payer: Medicare Other

## 2019-04-21 ENCOUNTER — Other Ambulatory Visit: Payer: Self-pay

## 2019-04-21 ENCOUNTER — Inpatient Hospital Stay (HOSPITAL_BASED_OUTPATIENT_CLINIC_OR_DEPARTMENT_OTHER): Payer: Medicare Other | Admitting: Hematology and Oncology

## 2019-04-21 VITALS — BP 137/77 | HR 80 | Temp 97.8°F | Resp 16 | Wt 182.7 lb

## 2019-04-21 DIAGNOSIS — Z794 Long term (current) use of insulin: Secondary | ICD-10-CM | POA: Diagnosis not present

## 2019-04-21 DIAGNOSIS — E785 Hyperlipidemia, unspecified: Secondary | ICD-10-CM | POA: Insufficient documentation

## 2019-04-21 DIAGNOSIS — E538 Deficiency of other specified B group vitamins: Secondary | ICD-10-CM | POA: Insufficient documentation

## 2019-04-21 DIAGNOSIS — Z7951 Long term (current) use of inhaled steroids: Secondary | ICD-10-CM | POA: Insufficient documentation

## 2019-04-21 DIAGNOSIS — R109 Unspecified abdominal pain: Secondary | ICD-10-CM | POA: Insufficient documentation

## 2019-04-21 DIAGNOSIS — Z87891 Personal history of nicotine dependence: Secondary | ICD-10-CM | POA: Diagnosis not present

## 2019-04-21 DIAGNOSIS — I10 Essential (primary) hypertension: Secondary | ICD-10-CM | POA: Diagnosis not present

## 2019-04-21 DIAGNOSIS — E119 Type 2 diabetes mellitus without complications: Secondary | ICD-10-CM | POA: Diagnosis not present

## 2019-04-21 DIAGNOSIS — J45909 Unspecified asthma, uncomplicated: Secondary | ICD-10-CM | POA: Diagnosis not present

## 2019-04-21 DIAGNOSIS — Z79899 Other long term (current) drug therapy: Secondary | ICD-10-CM | POA: Insufficient documentation

## 2019-04-21 DIAGNOSIS — M3320 Polymyositis, organ involvement unspecified: Secondary | ICD-10-CM | POA: Insufficient documentation

## 2019-04-21 DIAGNOSIS — D649 Anemia, unspecified: Secondary | ICD-10-CM

## 2019-04-21 DIAGNOSIS — R7989 Other specified abnormal findings of blood chemistry: Secondary | ICD-10-CM

## 2019-04-21 DIAGNOSIS — R945 Abnormal results of liver function studies: Secondary | ICD-10-CM

## 2019-04-21 LAB — COMPREHENSIVE METABOLIC PANEL
ALT: 128 U/L — ABNORMAL HIGH (ref 0–44)
AST: 107 U/L — ABNORMAL HIGH (ref 15–41)
Albumin: 3.8 g/dL (ref 3.5–5.0)
Alkaline Phosphatase: 45 U/L (ref 38–126)
Anion gap: 7 (ref 5–15)
BUN: 19 mg/dL (ref 8–23)
CO2: 27 mmol/L (ref 22–32)
Calcium: 9.6 mg/dL (ref 8.9–10.3)
Chloride: 102 mmol/L (ref 98–111)
Creatinine, Ser: 0.81 mg/dL (ref 0.44–1.00)
GFR calc Af Amer: >60 mL/min (ref >60)
GFR calc non Af Amer: >60 mL/min (ref >60)
Glucose, Bld: 211 mg/dL — ABNORMAL HIGH (ref 70–99)
Potassium: 4 mmol/L (ref 3.5–5.1)
Sodium: 136 mmol/L (ref 135–145)
Total Bilirubin: 0.6 mg/dL (ref 0.3–1.2)
Total Protein: 7 g/dL (ref 6.5–8.1)

## 2019-04-21 LAB — CBC WITH DIFFERENTIAL/PLATELET
Abs Immature Granulocytes: 0.05 10*3/uL (ref 0.00–0.07)
Basophils Absolute: 0 10*3/uL (ref 0.0–0.1)
Basophils Relative: 0 %
Eosinophils Absolute: 0.1 10*3/uL (ref 0.0–0.5)
Eosinophils Relative: 1 %
HCT: 33.6 % — ABNORMAL LOW (ref 36.0–46.0)
Hemoglobin: 10.7 g/dL — ABNORMAL LOW (ref 12.0–15.0)
Immature Granulocytes: 1 %
Lymphocytes Relative: 16 %
Lymphs Abs: 1.5 10*3/uL (ref 0.7–4.0)
MCH: 30.4 pg (ref 26.0–34.0)
MCHC: 31.8 g/dL (ref 30.0–36.0)
MCV: 95.5 fL (ref 80.0–100.0)
Monocytes Absolute: 0.3 10*3/uL (ref 0.1–1.0)
Monocytes Relative: 3 %
Neutro Abs: 7.1 10*3/uL (ref 1.7–7.7)
Neutrophils Relative %: 79 %
Platelets: 273 10*3/uL (ref 150–400)
RBC: 3.52 MIL/uL — ABNORMAL LOW (ref 3.87–5.11)
RDW: 14.4 % (ref 11.5–15.5)
WBC: 9 10*3/uL (ref 4.0–10.5)
nRBC: 0 % (ref 0.0–0.2)

## 2019-04-21 LAB — CK: Total CK: 3955 U/L — ABNORMAL HIGH (ref 38–234)

## 2019-04-21 NOTE — Progress Notes (Signed)
Pt here for follow up. Reports slight RLQ pain that comes and goes x 2 months. Denies any other concerns at this time.

## 2019-04-22 ENCOUNTER — Inpatient Hospital Stay: Payer: Medicare Other

## 2019-04-22 VITALS — BP 162/89 | HR 84 | Temp 97.2°F | Resp 18

## 2019-04-22 DIAGNOSIS — M3320 Polymyositis, organ involvement unspecified: Secondary | ICD-10-CM | POA: Diagnosis not present

## 2019-04-22 MED ORDER — ACETAMINOPHEN 325 MG PO TABS
650.0000 mg | ORAL_TABLET | Freq: Once | ORAL | Status: AC
  Administered 2019-04-22: 650 mg via ORAL
  Filled 2019-04-22: qty 2

## 2019-04-22 MED ORDER — DIPHENHYDRAMINE HCL 25 MG PO CAPS
ORAL_CAPSULE | ORAL | Status: AC
  Filled 2019-04-22: qty 1

## 2019-04-22 MED ORDER — DEXTROSE 5 % IV SOLN
Freq: Once | INTRAVENOUS | Status: AC
  Administered 2019-04-22: 09:00:00 via INTRAVENOUS
  Filled 2019-04-22: qty 250

## 2019-04-22 MED ORDER — IMMUNE GLOBULIN (HUMAN) 5 GM/50ML IV SOLN
75.0000 g | INTRAVENOUS | Status: DC
  Administered 2019-04-22: 09:00:00 75 g via INTRAVENOUS
  Filled 2019-04-22: qty 50

## 2019-04-22 MED ORDER — METHYLPREDNISOLONE SODIUM SUCC 125 MG IJ SOLR
40.0000 mg | INTRAMUSCULAR | Status: DC
  Administered 2019-04-22: 40 mg via INTRAVENOUS
  Filled 2019-04-22: qty 2

## 2019-04-22 MED ORDER — DIPHENHYDRAMINE HCL 25 MG PO CAPS
25.0000 mg | ORAL_CAPSULE | Freq: Once | ORAL | Status: AC
  Administered 2019-04-22: 09:00:00 25 mg via ORAL
  Filled 2019-04-22: qty 1

## 2019-04-23 ENCOUNTER — Other Ambulatory Visit: Payer: Self-pay

## 2019-04-23 ENCOUNTER — Inpatient Hospital Stay: Payer: Medicare Other

## 2019-04-23 VITALS — BP 156/79 | HR 69 | Temp 96.0°F | Resp 18 | Wt 182.0 lb

## 2019-04-23 DIAGNOSIS — M3320 Polymyositis, organ involvement unspecified: Secondary | ICD-10-CM | POA: Diagnosis not present

## 2019-04-23 MED ORDER — IMMUNE GLOBULIN (HUMAN) 5 GM/50ML IV SOLN
75.0000 g | Freq: Once | INTRAVENOUS | Status: AC
  Administered 2019-04-23: 75 g via INTRAVENOUS
  Filled 2019-04-23: qty 50

## 2019-04-23 MED ORDER — DIPHENHYDRAMINE HCL 25 MG PO CAPS
25.0000 mg | ORAL_CAPSULE | Freq: Once | ORAL | Status: AC
  Administered 2019-04-23: 25 mg via ORAL
  Filled 2019-04-23: qty 1

## 2019-04-23 MED ORDER — METHYLPREDNISOLONE SODIUM SUCC 125 MG IJ SOLR
40.0000 mg | INTRAMUSCULAR | Status: DC
  Administered 2019-04-23: 40 mg via INTRAVENOUS
  Filled 2019-04-23: qty 2

## 2019-04-23 MED ORDER — METHYLPREDNISOLONE SODIUM SUCC 125 MG IJ SOLR: 40.0000 mg | INTRAMUSCULAR | Status: DC

## 2019-04-23 MED ORDER — DEXTROSE 5 % IV SOLN
Freq: Once | INTRAVENOUS | Status: AC
  Administered 2019-04-23: 09:00:00 via INTRAVENOUS
  Filled 2019-04-23: qty 250

## 2019-04-23 MED ORDER — DIPHENHYDRAMINE HCL 25 MG PO CAPS
ORAL_CAPSULE | ORAL | Status: AC
  Filled 2019-04-23: qty 1

## 2019-04-23 MED ORDER — IMMUNE GLOBULIN (HUMAN) 5 GM/50ML IV SOLN: 1.0000 g/kg | Freq: Once | INTRAVENOUS | Status: DC

## 2019-04-23 MED ORDER — ACETAMINOPHEN 325 MG PO TABS
650.0000 mg | ORAL_TABLET | Freq: Once | ORAL | Status: AC
  Administered 2019-04-23: 650 mg via ORAL
  Filled 2019-04-23: qty 2

## 2019-04-25 ENCOUNTER — Telehealth: Payer: Self-pay

## 2019-04-25 NOTE — Telephone Encounter (Signed)
Spoke to pt. She said she is doing well. FBS 123, 109, 98.   She is asking how long is she supposed to take the insulin. Wasn't sure if it was short term or long term.

## 2019-04-25 NOTE — Telephone Encounter (Signed)
-----   Message from [name], MD sent at 04/09/2019  9:49 AM EDT ----- Please call her in about 2 weeks to be sure she is doing okay on the insulin

## 2019-04-25 NOTE — Telephone Encounter (Signed)
Those are excellent I expect that she will need the insulin long term---she should just let me know if she has any low sugar reactions

## 2019-04-26 NOTE — Telephone Encounter (Signed)
Spoke to pt

## 2019-04-28 ENCOUNTER — Telehealth: Payer: Self-pay | Admitting: *Deleted

## 2019-04-28 NOTE — Telephone Encounter (Signed)
Have her stop the medication for 2-3 weeks to see if the cramps go away. The way to confirm it is really causing it would be to restart it again. If the cramps recur, then it is causing it

## 2019-04-28 NOTE — Telephone Encounter (Signed)
Patent called stating that she is having cramps and pain in her buttocks and wondering if this could be caused by the Fosamax that she is taking?  Patient stated that she read that this could be a side effect of this medication.  Patent wants to know if she should stop the medication?

## 2019-04-29 NOTE — Telephone Encounter (Signed)
Spoke to pt

## 2019-05-13 ENCOUNTER — Other Ambulatory Visit: Payer: Self-pay

## 2019-05-13 DIAGNOSIS — Z79899 Other long term (current) drug therapy: Secondary | ICD-10-CM

## 2019-05-13 DIAGNOSIS — M3320 Polymyositis, organ involvement unspecified: Secondary | ICD-10-CM

## 2019-05-15 NOTE — Progress Notes (Signed)
[facility]  [address], [address] [place], [place] [address] Phone: [phone]  Fax: [phone]   Clinic Day:  05/20/2019  Referring physician: [name], MD  Chief Complaint: Bridget Romero is a 73 y.o. female with polymyositis who is seen for assessment and continuation of monthly IVIG.  HPI: The patient was last seen in the hematology clinic on 04/21/2019.  At that time, she noted a 2 month history of lower abdominal discomfort. LFTs had increased since last visit.  AST was 107.  ALT was 128. CPK had increased to 3955.  Decision was made by Dr [name] to return to monthly IVIG.  She received day 1 of a two day dose of IVIG.   She contacted her PCP on 04/28/2019 for advice about insulin dosage and cramps in her buttock. Fosamax was stopped to monitor if cramps resolve in 2 weeks. Insulin will need to be used long term.    During the interim, I'm doing okay. She reports aches and pains in her legs, slightly improved over the last month. It resolves with 2 ibuprofen 1x daily. Pain is a 5/10. Lower abdominal discomfort has resolved.   She is up 3 pounds in the clinic today. She notes her prednisone was increased to 10mg  daily and would like to decrease it to 5mg  daily. She was taken off azathioprine and put back on methotrexate two weeks ago.    Past Medical History:  Diagnosis Date  . Allergy   . Asthma   . Diabetes mellitus   . Hyperlipidemia   . Hypertension   . Neuromuscular disorder (HCC)    polymyositis  . Osteoarthritis   . Vitamin B12 deficiency     Past Surgical History:  Procedure Laterality Date  . COLONOSCOPY WITH PROPOFOL N/A 03/07/2016   Procedure: COLONOSCOPY WITH PROPOFOL;  Surgeon: Lollie Sails, MD;  Location: [facility] ENDOSCOPY;  Service: Endoscopy;  Laterality: N/A;    Family History  Problem Relation Age of Onset  . Heart disease Mother   . Cancer Father     Social History:  reports that she has quit smoking.  She has never used smokeless tobacco. She reports that she does not drink alcohol or use drugs. She is a Training and development officer.  She lives in [place].  The patient is alone today.  Allergies:  Allergies  Allergen Reactions  . Atorvastatin Other (See Comments)    myalgias  . Pravastatin Other (See Comments)    Muscle pain    Current Medications: Current Outpatient Medications  Medication Sig Dispense Refill  . ACCU-CHEK FASTCLIX LANCETS MISC Use to test blood sugar twice daily dx: 250.02 100 each 3  . Alcohol Swabs (B-D SINGLE USE SWABS REGULAR) PADS Use to test blood sugar once daily dx: 250.00 100 each 3  . alendronate (FOSAMAX) 70 MG tablet TAKE 1 TABLET BY MOUTH EVERY 7 DAYS WITH A FULL GLASS OF WATER, DO NOT LIE DOWN FOR THE NEXT 30 MINUTES 12 tablet 3  . atorvastatin (LIPITOR) 10 MG tablet Take 1 tablet (10 mg total) by mouth daily. 90 tablet 3  . azaTHIOprine (IMURAN) 50 MG tablet take 3.5 tablets by mouth once daily (Patient taking differently: Take 125 mg by mouth daily. take 2.5 tablets by mouth once daily) 105 tablet 0  . Cholecalciferol (VITAMIN D) 1000 UNITS capsule Take 1,000 Units by mouth daily.      . folic acid (FOLVITE) 1 MG tablet Take 1 mg by mouth daily.    Marland Kitchen  gabapentin (NEURONTIN) 300 MG capsule Take 300 mg by mouth 2 (two) times daily.    Marland Kitchen glucose blood (ACCU-CHEK SMARTVIEW) test strip Use to check blood sugar twice a day Dx Code E11.4 100 each 6  . insulin glargine (LANTUS) 100 UNIT/ML injection Inject 0.1-0.2 mLs (10-20 Units total) into the skin daily. 10 mL 11  . lisinopril (PRINIVIL,ZESTRIL) 10 MG tablet TAKE 1 TABLET BY MOUTH EVERY DAY 90 tablet 1  . metFORMIN (GLUCOPHAGE) 1000 MG tablet TAKE 1 TABLET BY MOUTH TWICE DAILY 60 tablet 11  . methotrexate (RHEUMATREX) 2.5 MG tablet Take by mouth.    . Multiple Vitamin (MULTIVITAMIN) tablet Take 1 tablet by mouth daily.      . predniSONE (DELTASONE) 5 MG tablet Take 10 mg by mouth daily with breakfast.     . acetaminophen  (TYLENOL) 500 MG tablet Take 500 mg by mouth every 6 (six) hours as needed.    Marland Kitchen albuterol (PROVENTIL HFA;VENTOLIN HFA) 108 (90 BASE) MCG/ACT inhaler Inhale 2 puffs into the lungs every 6 (six) hours as needed for wheezing or shortness of breath. (Patient not taking: Reported on 05/20/2019) 2 Inhaler 3  . fluticasone (FLOVENT HFA) 110 MCG/ACT inhaler Inhale 2 puffs into the lungs daily. (Patient not taking: Reported on 05/20/2019) 12 g 3   No current facility-administered medications for this visit.     Review of Systems  Constitutional: Negative.  Negative for chills, diaphoresis, fever, malaise/fatigue and weight loss (up 3 pounds).       "I'm doing okay."  HENT: Negative for congestion, ear discharge, hearing loss, nosebleeds, sinus pain and sore throat.   Eyes: Negative.  Negative for blurred vision, double vision, photophobia and pain.  Respiratory: Negative.  Negative for cough, hemoptysis, sputum production and shortness of breath.   Cardiovascular: Negative.  Negative for chest pain, palpitations, orthopnea, claudication, leg swelling and PND.  Gastrointestinal: Negative.  Negative for abdominal pain, blood in stool, constipation, diarrhea, heartburn, melena, nausea and vomiting.  Genitourinary: Negative.  Negative for dysuria, flank pain, frequency, hematuria and urgency.  Musculoskeletal: Positive for myalgias (legs, secondary to polymyositis, discomfort 5/10). Negative for back pain, falls, joint pain and neck pain.  Skin: Negative.  Negative for itching and rash.  Neurological: Negative.  Negative for dizziness, tingling, tremors, sensory change, speech change, focal weakness, weakness and headaches.  Endo/Heme/Allergies: Negative.  Does not bruise/bleed easily.       Diabetes.  Blood sugar elevated on higher dose of prednisone.  Psychiatric/Behavioral: Negative.  Negative for depression and memory loss. The patient is not nervous/anxious and does not have insomnia.   All other  systems reviewed and are negative.  Performance status (ECOG): 1  Physical Exam  Constitutional: She is oriented to person, place, and time. She appears well-developed and well-nourished. No distress.  HENT:  Head: Normocephalic and atraumatic.  Mouth/Throat: Oropharynx is clear and moist. No oropharyngeal exudate.  Curly dark hair.  Eyes: Pupils are equal, round, and reactive to light. Conjunctivae are normal. No scleral icterus.  Brown eyes.  Neck: Normal range of motion. Neck supple. No JVD present.  Cardiovascular: Normal rate, regular rhythm and normal heart sounds. Exam reveals no gallop and no friction rub.  No murmur heard. Pulmonary/Chest: Effort normal and breath sounds normal. No respiratory distress. She has no wheezes. She has no rales.  Abdominal: Soft. Bowel sounds are normal. She exhibits no distension and no mass. There is no abdominal tenderness. There is no rebound and no guarding.  Musculoskeletal:  General: Tenderness (left gastrocnemius) present. No deformity or edema.  Lymphadenopathy:    She has no cervical adenopathy.    She has no axillary adenopathy.       Right: No supraclavicular adenopathy present.       Left: No supraclavicular adenopathy present.  Neurological: She is alert and oriented to person, place, and time. She has normal strength. She displays no atrophy and no tremor. No sensory deficit. She exhibits normal muscle tone. Coordination normal.  Gait normal.  Skin: Skin is warm and dry. No rash noted. She is not diaphoretic. No erythema. No pallor.  Psychiatric: She has a normal mood and affect. Her behavior is normal. Judgment and thought content normal.  Nursing note and vitals reviewed.   Appointment on 05/20/2019  Component Date Value Ref Range Status  . Sodium 05/20/2019 135  135 - 145 mmol/L Final  . Potassium 05/20/2019 4.0  3.5 - 5.1 mmol/L Final  . Chloride 05/20/2019 101  98 - 111 mmol/L Final  . CO2 05/20/2019 25  22 - 32  mmol/L Final  . Glucose, Bld 05/20/2019 227* 70 - 99 mg/dL Final  . BUN 05/20/2019 19  8 - 23 mg/dL Final  . Creatinine, Ser 05/20/2019 0.57  0.44 - 1.00 mg/dL Final  . Calcium 05/20/2019 9.2  8.9 - 10.3 mg/dL Final  . Total Protein 05/20/2019 6.6  6.5 - 8.1 g/dL Final  . Albumin 05/20/2019 3.6  3.5 - 5.0 g/dL Final  . AST 05/20/2019 134* 15 - 41 U/L Final  . ALT 05/20/2019 200* 0 - 44 U/L Final  . Alkaline Phosphatase 05/20/2019 39  38 - 126 U/L Final  . Total Bilirubin 05/20/2019 0.9  0.3 - 1.2 mg/dL Final  . GFR calc non Af Amer 05/20/2019 >60  >60 mL/min Final  . GFR calc Af Amer 05/20/2019 >60  >60 mL/min Final  . Anion gap 05/20/2019 9  5 - 15 Final   Performed at [facility], [address], [place], [place] [address]  . WBC 05/20/2019 7.8  4.0 - 10.5 K/uL Final  . RBC 05/20/2019 3.28* 3.87 - 5.11 MIL/uL Final  . Hemoglobin 05/20/2019 10.1* 12.0 - 15.0 g/dL Final  . HCT 05/20/2019 31.5* 36.0 - 46.0 % Final  . MCV 05/20/2019 96.0  80.0 - 100.0 fL Final  . MCH 05/20/2019 30.8  26.0 - 34.0 pg Final  . MCHC 05/20/2019 32.1  30.0 - 36.0 g/dL Final  . RDW 05/20/2019 14.4  11.5 - 15.5 % Final  . Platelets 05/20/2019 248  150 - 400 K/uL Final  . nRBC 05/20/2019 0.0  0.0 - 0.2 % Final  . Neutrophils Relative % 05/20/2019 72  % Final  . Neutro Abs 05/20/2019 5.5  1.7 - 7.7 K/uL Final  . Lymphocytes Relative 05/20/2019 22  % Final  . Lymphs Abs 05/20/2019 1.7  0.7 - 4.0 K/uL Final  . Monocytes Relative 05/20/2019 5  % Final  . Monocytes Absolute 05/20/2019 0.4  0.1 - 1.0 K/uL Final  . Eosinophils Relative 05/20/2019 1  % Final  . Eosinophils Absolute 05/20/2019 0.1  0.0 - 0.5 K/uL Final  . Basophils Relative 05/20/2019 0  % Final  . Basophils Absolute 05/20/2019 0.0  0.0 - 0.1 K/uL Final  . Immature Granulocytes 05/20/2019 0  % Final  . Abs Immature Granulocytes 05/20/2019 0.03  0.00 - 0.07 K/uL Final   Performed at [facility], [address]  [place], [place] [address]  Assessment:  Bridget Romero is a 73 y.o. 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 She receives IVIG monthly, methotrexate, and prednisone 10 mg a day.  CK has been followed:  > 5555 on 05/30/2017, 4409 on 06/20/2017, 3546 on 07/05/2017, 2725 on 07/30/2017, 3675 on 08/28/2017, 2405 on 10/08/2017, 2743 on 11/08/2017, 2723 on 12/20/2017, 723 on 01/21/2018, 507 on 02/21/2018, 2192 on 04/11/2018, 2227 on 06/04/2018, 4468 on 07/22/2018, 2204 on 09/06/2018, 1360 on 12/02/2018, 1423 on 01/01/2019, 912 on 03/03/2019, 3955 on 04/21/2019, and 4560 on 05/20/2019.    Aldolase was 67.2 in 05/30/2017, 69.9 on 06/20/2017, 64.8 on 07/05/2017, 57.6 on 07/30/2017, 60.3 on 08/28/2017, 38.7 on 12/20/2017, 9.3 on 01/21/2018, 7.4 on 02/21/2018, 29.1 on 04/11/2018, to 51.9 on 07/22/2018, 23.7 on 09/06/2018, 16.4 on 12/02/2018, 14.1 on 01/01/2019 and 8.8 on 03/03/2019.  She received IVIG (Privigen) 75 gm x 2 days every 4 weeks beginning 12/07/2017 (last 2 day infusion 04/21/2019 - 04/22/2019). IVIG was decreased to every 8 weeks on 04/22/2019, but hen returned to monthly secondary to rise CK.  Last IVIG was 04/21/2019.  She has a normocytic anemia.  Baseline hemoglobin is 10.  Last colonoscopy on 03/07/2016 revealed grade I hemorrhoids.  She has B12 deficiency and is on oral B12.  B12 was 306 and folate > 22.3 on 05/30/2017.   She has chronic mild elevation in LFTs.  Hepatitis B and C testing were negative on 06/20/2017.  Symptomatically, she notes achiness in her legs over the past month.  Plan: 1.   Labs today:  CBC with diff, CMP, CK, aldolase. 2.   Polymyositis             CK 912 on 03/03/2019, 3955 on 04/21/2019, and 4560 on 05/20/2019.             Discussed with Dr [name].             Plan remains on monthly IVIG infusions.  She notes change in medications with increase prednisone 10 mg a day and MTX 6 pills/week.             Continue IVIG (Privigen) 75 gm x 2 days each month.  Confirmed no  change in dose based on Weight with Dr [name].             Premedications:  Solumedrol 40 mg IV, Benadryl 25 mg po, and Tylenol 650 mg po.             Anticipate follow-up with Dr. [name] regarding ongoing treatment. 3.   Elevated LFTs             AST 157 and ALT 243 on 05/30/2017.             AST   53 and ALT   54 on 02/17/2019.             AST 107 and ALT 128 on 04/21/2019.  AST 134 and ALT 200 on 05/20/2019.             Hepatitis serologies were negative on 06/20/2017.             Increased LFTs correlated with increased CPK on 06/20/2017.             CPK reflects activity of polymyositis.             Reviewed with Dr. [name].   Confirmed plan to proceed with IVIG today. 4.   IVIG today and tomorrow. 5.   RTC in 1 month for MD assessment, labs (CBC  with diff, CMP, aldolase and IVIG), and +/- IVIG x 2 days.  I discussed the assessment and treatment plan with the patient.  The patient was provided an opportunity to ask questions and all were answered.  The patient agreed with the plan and demonstrated an understanding of the instructions.  The patient was advised to call back if the symptoms worsen or if the condition fails to improve as anticipated.  I provided 15 minutes of face-to-face time during this this encounter and > 50% was spent counseling as documented under my assessment and plan.    Lequita Asal, MD, PhD    05/20/2019, 8:41 AM  I, Cloyde Reams Dorshimer, am acting as Education administrator for [employer]. Mike Gip, MD, PhD.  I, Melissa C. Mike Gip, MD, have reviewed the above documentation for accuracy and completeness, and I agree with the above.

## 2019-05-17 ENCOUNTER — Other Ambulatory Visit: Payer: Self-pay | Admitting: Internal Medicine

## 2019-05-20 ENCOUNTER — Inpatient Hospital Stay: Payer: Medicare Other

## 2019-05-20 ENCOUNTER — Encounter: Payer: Self-pay | Admitting: Hematology and Oncology

## 2019-05-20 ENCOUNTER — Other Ambulatory Visit: Payer: Self-pay

## 2019-05-20 ENCOUNTER — Inpatient Hospital Stay: Payer: Medicare Other | Attending: Hematology and Oncology | Admitting: Hematology and Oncology

## 2019-05-20 VITALS — BP 142/82 | HR 71 | Temp 97.3°F | Resp 18 | Ht 65.0 in | Wt 185.7 lb

## 2019-05-20 VITALS — BP 142/82 | HR 67 | Temp 97.6°F | Resp 17

## 2019-05-20 DIAGNOSIS — E119 Type 2 diabetes mellitus without complications: Secondary | ICD-10-CM | POA: Diagnosis not present

## 2019-05-20 DIAGNOSIS — E538 Deficiency of other specified B group vitamins: Secondary | ICD-10-CM | POA: Diagnosis not present

## 2019-05-20 DIAGNOSIS — R7989 Other specified abnormal findings of blood chemistry: Secondary | ICD-10-CM | POA: Diagnosis not present

## 2019-05-20 DIAGNOSIS — M3320 Polymyositis, organ involvement unspecified: Secondary | ICD-10-CM

## 2019-05-20 DIAGNOSIS — Z87891 Personal history of nicotine dependence: Secondary | ICD-10-CM

## 2019-05-20 DIAGNOSIS — Z79899 Other long term (current) drug therapy: Secondary | ICD-10-CM

## 2019-05-20 LAB — CBC WITH DIFFERENTIAL/PLATELET
Abs Immature Granulocytes: 0.03 10*3/uL (ref 0.00–0.07)
Basophils Absolute: 0 10*3/uL (ref 0.0–0.1)
Basophils Relative: 0 %
Eosinophils Absolute: 0.1 10*3/uL (ref 0.0–0.5)
Eosinophils Relative: 1 %
HCT: 31.5 % — ABNORMAL LOW (ref 36.0–46.0)
Hemoglobin: 10.1 g/dL — ABNORMAL LOW (ref 12.0–15.0)
Immature Granulocytes: 0 %
Lymphocytes Relative: 22 %
Lymphs Abs: 1.7 10*3/uL (ref 0.7–4.0)
MCH: 30.8 pg (ref 26.0–34.0)
MCHC: 32.1 g/dL (ref 30.0–36.0)
MCV: 96 fL (ref 80.0–100.0)
Monocytes Absolute: 0.4 10*3/uL (ref 0.1–1.0)
Monocytes Relative: 5 %
Neutro Abs: 5.5 10*3/uL (ref 1.7–7.7)
Neutrophils Relative %: 72 %
Platelets: 248 10*3/uL (ref 150–400)
RBC: 3.28 MIL/uL — ABNORMAL LOW (ref 3.87–5.11)
RDW: 14.4 % (ref 11.5–15.5)
WBC: 7.8 10*3/uL (ref 4.0–10.5)
nRBC: 0 % (ref 0.0–0.2)

## 2019-05-20 LAB — COMPREHENSIVE METABOLIC PANEL
ALT: 200 U/L — ABNORMAL HIGH (ref 0–44)
AST: 134 U/L — ABNORMAL HIGH (ref 15–41)
Albumin: 3.6 g/dL (ref 3.5–5.0)
Alkaline Phosphatase: 39 U/L (ref 38–126)
Anion gap: 9 (ref 5–15)
BUN: 19 mg/dL (ref 8–23)
CO2: 25 mmol/L (ref 22–32)
Calcium: 9.2 mg/dL (ref 8.9–10.3)
Chloride: 101 mmol/L (ref 98–111)
Creatinine, Ser: 0.57 mg/dL (ref 0.44–1.00)
GFR calc Af Amer: >60 mL/min (ref >60)
GFR calc non Af Amer: >60 mL/min (ref >60)
Glucose, Bld: 227 mg/dL — ABNORMAL HIGH (ref 70–99)
Potassium: 4 mmol/L (ref 3.5–5.1)
Sodium: 135 mmol/L (ref 135–145)
Total Bilirubin: 0.9 mg/dL (ref 0.3–1.2)
Total Protein: 6.6 g/dL (ref 6.5–8.1)

## 2019-05-20 LAB — CK: Total CK: 4560 U/L — ABNORMAL HIGH (ref 38–234)

## 2019-05-20 MED ORDER — DIPHENHYDRAMINE HCL 25 MG PO TABS
25.0000 mg | ORAL_TABLET | Freq: Once | ORAL | Status: AC
  Administered 2019-05-20: 25 mg via ORAL
  Filled 2019-05-20: qty 1

## 2019-05-20 MED ORDER — DEXTROSE 5 % IV SOLN
Freq: Once | INTRAVENOUS | Status: AC
  Administered 2019-05-20: 09:00:00 via INTRAVENOUS
  Filled 2019-05-20: qty 250

## 2019-05-20 MED ORDER — METHYLPREDNISOLONE SODIUM SUCC 125 MG IJ SOLR
40.0000 mg | INTRAMUSCULAR | Status: DC
  Administered 2019-05-20: 40 mg via INTRAVENOUS
  Filled 2019-05-20: qty 2

## 2019-05-20 MED ORDER — IMMUNE GLOBULIN (HUMAN) 5 GM/50ML IV SOLN
75.0000 g | INTRAVENOUS | Status: DC
  Administered 2019-05-20: 10:00:00 75 g via INTRAVENOUS
  Filled 2019-05-20: qty 50

## 2019-05-20 MED ORDER — ACETAMINOPHEN 325 MG PO TABS
650.0000 mg | ORAL_TABLET | Freq: Once | ORAL | Status: AC
  Administered 2019-05-20: 650 mg via ORAL
  Filled 2019-05-20: qty 2

## 2019-05-20 MED ORDER — DIPHENHYDRAMINE HCL 25 MG PO CAPS
ORAL_CAPSULE | ORAL | Status: AC
  Filled 2019-05-20: qty 1

## 2019-05-20 NOTE — Progress Notes (Signed)
Patient c/o pain to bilateral legs ( pain level 5) occ.

## 2019-05-21 ENCOUNTER — Inpatient Hospital Stay: Payer: Medicare Other

## 2019-05-21 VITALS — BP 159/80 | HR 77 | Temp 97.8°F | Resp 18

## 2019-05-21 DIAGNOSIS — M3320 Polymyositis, organ involvement unspecified: Secondary | ICD-10-CM

## 2019-05-21 LAB — ALDOLASE: Aldolase: 42.9 U/L — ABNORMAL HIGH (ref 3.3–10.3)

## 2019-05-21 MED ORDER — DEXTROSE 5 % IV SOLN
Freq: Once | INTRAVENOUS | Status: AC
  Administered 2019-05-21: 09:00:00 via INTRAVENOUS
  Filled 2019-05-21: qty 250

## 2019-05-21 MED ORDER — DIPHENHYDRAMINE HCL 25 MG PO CAPS
ORAL_CAPSULE | ORAL | Status: AC
  Filled 2019-05-21: qty 1

## 2019-05-21 MED ORDER — DIPHENHYDRAMINE HCL 25 MG PO TABS
25.0000 mg | ORAL_TABLET | Freq: Once | ORAL | Status: AC
  Administered 2019-05-21: 25 mg via ORAL
  Filled 2019-05-21: qty 1

## 2019-05-21 MED ORDER — METHYLPREDNISOLONE SODIUM SUCC 125 MG IJ SOLR
40.0000 mg | INTRAMUSCULAR | Status: DC
  Administered 2019-05-21: 40 mg via INTRAVENOUS
  Filled 2019-05-21: qty 2

## 2019-05-21 MED ORDER — ACETAMINOPHEN 325 MG PO TABS
650.0000 mg | ORAL_TABLET | Freq: Once | ORAL | Status: AC
  Administered 2019-05-21: 650 mg via ORAL
  Filled 2019-05-21: qty 2

## 2019-05-21 MED ORDER — IMMUNE GLOBULIN (HUMAN) 5 GM/50ML IV SOLN
75.0000 g | INTRAVENOUS | Status: DC
  Administered 2019-05-21: 75 g via INTRAVENOUS
  Filled 2019-05-21: qty 50

## 2019-05-21 NOTE — Patient Instructions (Signed)

## 2019-05-26 ENCOUNTER — Ambulatory Visit: Payer: Medicare Other | Admitting: Internal Medicine

## 2019-06-04 ENCOUNTER — Telehealth: Payer: Self-pay | Admitting: Internal Medicine

## 2019-06-04 NOTE — Telephone Encounter (Signed)
Patient called today to see if it was time for her to come back in for labs. She mentioned doing a urine specimen.   Is this patient due to have lab work done?

## 2019-06-05 NOTE — Telephone Encounter (Signed)
Spoke to pt. Advised her I did not see any labs needed from Dr Silvio Pate anytime soon. Looks like she may have some due from Dr Mike Gip. She will call them to see when she is supposed to get her labs with them.

## 2019-06-10 ENCOUNTER — Telehealth: Payer: Self-pay | Admitting: *Deleted

## 2019-06-10 NOTE — Telephone Encounter (Signed)
Patient left a voicemail stating that she is confused as how to take her insulin. Patient stated that she would like a call back with clarification as how she is suppose to take her insulin.

## 2019-06-10 NOTE — Telephone Encounter (Signed)
I had asked her to restart the insulin at 10 units daily and slowly increase till her morning sugars are under 140. I thought she might get up to at least 20 units a day Find out what happened with that

## 2019-06-10 NOTE — Telephone Encounter (Signed)
If it stays under 90 regularly, she should decrease the lantus to just 6 units a day. I am not really concerned unless she actually feels a low sugar reaction---as opposed to just a mildly low number

## 2019-06-10 NOTE — Telephone Encounter (Signed)
Spoke to pt. She stated she understood.

## 2019-06-10 NOTE — Telephone Encounter (Signed)
Spoke to pt. She is taking 10 units in am and Metformin twice a day. She said her am FBS is 79. She is asking if it gets any lower should she not not take the insulin that day or hold the metformin in the am?

## 2019-06-12 NOTE — Progress Notes (Signed)
[facility]  [address] [place],  [address] Phone: [phone]  Fax: [phone]   Clinic Day:  06/16/2019  Referring physician: [name], MD  Chief Complaint: Bridget Romero is a 73 y.o. female with polymyositis who is seen for assessment and continuation of monthly IVIG.  HPI: The patient was last seen in the hematology clinic on 05/20/2019. At that time, she noted achiness in her legs over the past month.  She received IVIG on 05/20/2019 and 05/21/2019.  Labs reviewed: 05/06/2019 at the [facility]:  Aldolase 53.4.  CK 4,612. 05/20/2019:  AST 134, ALT 200. Aldolase 42.9. CK 4,560.   06/09/2019 at the [facility]:  AST 153, ALT 188.  Sed rate 52.  During the interim, the patient has done well.  She denies any weakness or pain today.  She notes that the back of her legs feels a little tight.  She takes Tylenol for pain.  She reports having some pain when going up stairs, and picking up her grandchild who weighs at least 25 lbs. She is currently taking 5 mg prednisone twice daily.  She sees Dr. [name] this Thursday, 06/19/2019.   Past Medical History:  Diagnosis Date  . Allergy   . Asthma   . Diabetes mellitus   . Hyperlipidemia   . Hypertension   . Neuromuscular disorder (HCC)    polymyositis  . Osteoarthritis   . Vitamin B12 deficiency     Past Surgical History:  Procedure Laterality Date  . COLONOSCOPY WITH PROPOFOL N/A 03/07/2016   Procedure: COLONOSCOPY WITH PROPOFOL;  Surgeon: Lollie Sails, MD;  Location: [facility] ENDOSCOPY;  Service: Endoscopy;  Laterality: N/A;    Family History  Problem Relation Age of Onset  . Heart disease Mother   . Cancer Father     Social History:  reports that she has quit smoking. She has never used smokeless tobacco. She reports that she does not drink alcohol or use drugs. She is a Training and development officer. She lives in [place]. The patient is alone today.  Allergies:  Allergies   Allergen Reactions  . Atorvastatin Other (See Comments)    myalgias  . Pravastatin Other (See Comments)    Muscle pain    Current Medications: Current Outpatient Medications  Medication Sig Dispense Refill  . ACCU-CHEK FASTCLIX LANCETS MISC Use to test blood sugar twice daily dx: 250.02 100 each 3  . acetaminophen (TYLENOL) 500 MG tablet Take 500 mg by mouth every 6 (six) hours as needed.    . Alcohol Swabs (B-D SINGLE USE SWABS REGULAR) PADS Use to test blood sugar once daily dx: 250.00 100 each 3  . alendronate (FOSAMAX) 70 MG tablet TAKE 1 TABLET EVERY 7 DAYS WITH A FULL GLASS OF WATER.(DO NOT LIE DOWN FOR THE NEXT 30 MINUTES ) 12 tablet 3  . atorvastatin (LIPITOR) 10 MG tablet Take 1 tablet (10 mg total) by mouth daily. 90 tablet 3  . azaTHIOprine (IMURAN) 50 MG tablet take 3.5 tablets by mouth once daily (Patient taking differently: Take 125 mg by mouth daily. take 2.5 tablets by mouth once daily) 105 tablet 0  . Cholecalciferol (VITAMIN D) 1000 UNITS capsule Take 1,000 Units by mouth daily.      . folic acid (FOLVITE) 1 MG tablet Take 1 mg by mouth daily.    Marland Kitchen gabapentin (NEURONTIN) 300 MG capsule Take 300 mg by mouth 2 (two) times daily.    Marland Kitchen glucose blood (ACCU-CHEK SMARTVIEW) test strip Use  to check blood sugar twice a day Dx Code E11.4 100 each 6  . insulin glargine (LANTUS) 100 UNIT/ML injection Inject 0.1-0.2 mLs (10-20 Units total) into the skin daily. 10 mL 11  . lisinopril (PRINIVIL,ZESTRIL) 10 MG tablet TAKE 1 TABLET BY MOUTH EVERY DAY 90 tablet 1  . metFORMIN (GLUCOPHAGE) 1000 MG tablet TAKE 1 TABLET BY MOUTH TWICE DAILY 60 tablet 11  . methotrexate (RHEUMATREX) 2.5 MG tablet Take by mouth.    . Multiple Vitamin (MULTIVITAMIN) tablet Take 1 tablet by mouth daily.      . predniSONE (DELTASONE) 5 MG tablet Take 10 mg by mouth daily with breakfast.     . albuterol (PROVENTIL HFA;VENTOLIN HFA) 108 (90 BASE) MCG/ACT inhaler Inhale 2 puffs into the lungs every 6 (six) hours as  needed for wheezing or shortness of breath. (Patient not taking: Reported on 05/20/2019) 2 Inhaler 3  . fluticasone (FLOVENT HFA) 110 MCG/ACT inhaler Inhale 2 puffs into the lungs daily. (Patient not taking: Reported on 05/20/2019) 12 g 3   No current facility-administered medications for this visit.     Review of Systems  Constitutional: Negative.  Negative for chills, diaphoresis, fever, malaise/fatigue and weight loss (stable).       Doing good.  HENT: Negative for congestion, ear discharge, hearing loss, nosebleeds, sinus pain and sore throat.   Eyes: Negative.  Negative for blurred vision, double vision, photophobia and pain.  Respiratory: Negative.  Negative for cough, hemoptysis, sputum production and shortness of breath.   Cardiovascular: Negative.  Negative for chest pain, palpitations, orthopnea, claudication, leg swelling and PND.  Gastrointestinal: Negative.  Negative for abdominal pain, blood in stool, constipation, diarrhea, heartburn, melena, nausea and vomiting.  Genitourinary: Negative.  Negative for dysuria, flank pain, frequency, hematuria and urgency.  Musculoskeletal: Negative for back pain, falls, joint pain, myalgias (legs, secondary to polymyositis, discomfort 5/10, improved) and neck pain.  Skin: Negative.  Negative for itching and rash.  Neurological: Negative.  Negative for dizziness, tingling, tremors, sensory change, speech change, focal weakness, weakness and headaches.  Endo/Heme/Allergies: Negative.  Does not bruise/bleed easily.       Diabetes.  Blood sugar elevated on higher dose of prednisone.  Psychiatric/Behavioral: Negative.  Negative for depression and memory loss. The patient is not nervous/anxious and does not have insomnia.   All other systems reviewed and are negative.  Performance status (ECOG): 1  Vitals: Blood pressure (!) 155/67, pulse 73, temperature 98.5 F (36.9 C), resp. rate 18, height 5\' 5"  (1.651 m), weight 185 lb 15.3 oz (84.4 kg), SpO2  100 %.   Physical Exam  Constitutional: She is oriented to person, place, and time. She appears well-developed and well-nourished. No distress.  HENT:  Head: Normocephalic and atraumatic.  Mouth/Throat: Oropharynx is clear and moist. No oropharyngeal exudate.  Curly dark hair.  Eyes: Pupils are equal, round, and reactive to light. Conjunctivae are normal. No scleral icterus.  Brown eyes. Mask.  Neck: Normal range of motion. Neck supple. No JVD present.  Cardiovascular: Normal rate, regular rhythm and normal heart sounds. Exam reveals no gallop and no friction rub.  No murmur heard. Pulmonary/Chest: Effort normal and breath sounds normal. No respiratory distress. She has no wheezes. She has no rales.  Abdominal: Soft. Bowel sounds are normal. She exhibits no distension and no mass. There is no abdominal tenderness. There is no rebound and no guarding.  Musculoskeletal:        General: No tenderness, deformity or edema.  Lymphadenopathy:  She has no cervical adenopathy.    She has no axillary adenopathy.       Right: No supraclavicular adenopathy present.       Left: No supraclavicular adenopathy present.  Neurological: She is alert and oriented to person, place, and time. She has normal strength. She displays no atrophy and no tremor. No sensory deficit. She exhibits normal muscle tone. Coordination normal.  Gait normal.  Skin: Skin is warm and dry. No rash noted. She is not diaphoretic. No erythema. No pallor.  Psychiatric: She has a normal mood and affect. Her behavior is normal. Judgment and thought content normal.  Nursing note and vitals reviewed.   Appointment on 06/16/2019  Component Date Value Ref Range Status  . Total CK 06/16/2019 4,532* 38 - 234 U/L Final   Comment: RESULTS CONFIRMED BY MANUAL DILUTION Performed at [facility], [address] [place], [place] [address]   . Sodium 06/16/2019 137  135 - 145 mmol/L Final  . Potassium 06/16/2019 3.8  3.5 -  5.1 mmol/L Final  . Chloride 06/16/2019 104  98 - 111 mmol/L Final  . CO2 06/16/2019 24  22 - 32 mmol/L Final  . Glucose, Bld 06/16/2019 228* 70 - 99 mg/dL Final  . BUN 06/16/2019 19  8 - 23 mg/dL Final  . Creatinine, Ser 06/16/2019 0.67  0.44 - 1.00 mg/dL Final  . Calcium 06/16/2019 9.3  8.9 - 10.3 mg/dL Final  . Total Protein 06/16/2019 6.8  6.5 - 8.1 g/dL Final  . Albumin 06/16/2019 3.6  3.5 - 5.0 g/dL Final  . AST 06/16/2019 137* 15 - 41 U/L Final  . ALT 06/16/2019 178* 0 - 44 U/L Final  . Alkaline Phosphatase 06/16/2019 44  38 - 126 U/L Final  . Total Bilirubin 06/16/2019 0.7  0.3 - 1.2 mg/dL Final  . GFR calc non Af Amer 06/16/2019 >60  >60 mL/min Final  . GFR calc Af Amer 06/16/2019 >60  >60 mL/min Final  . Anion gap 06/16/2019 9  5 - 15 Final   Performed at [facility], [address], [place], [place] [address]  . WBC 06/16/2019 8.2  4.0 - 10.5 K/uL Final  . RBC 06/16/2019 3.32* 3.87 - 5.11 MIL/uL Final  . Hemoglobin 06/16/2019 10.2* 12.0 - 15.0 g/dL Final  . HCT 06/16/2019 31.6* 36.0 - 46.0 % Final  . MCV 06/16/2019 95.2  80.0 - 100.0 fL Final  . MCH 06/16/2019 30.7  26.0 - 34.0 pg Final  . MCHC 06/16/2019 32.3  30.0 - 36.0 g/dL Final  . RDW 06/16/2019 13.9  11.5 - 15.5 % Final  . Platelets 06/16/2019 243  150 - 400 K/uL Final  . nRBC 06/16/2019 0.0  0.0 - 0.2 % Final  . Neutrophils Relative % 06/16/2019 71  % Final  . Neutro Abs 06/16/2019 5.8  1.7 - 7.7 K/uL Final  . Lymphocytes Relative 06/16/2019 23  % Final  . Lymphs Abs 06/16/2019 1.8  0.7 - 4.0 K/uL Final  . Monocytes Relative 06/16/2019 4  % Final  . Monocytes Absolute 06/16/2019 0.3  0.1 - 1.0 K/uL Final  . Eosinophils Relative 06/16/2019 2  % Final  . Eosinophils Absolute 06/16/2019 0.1  0.0 - 0.5 K/uL Final  . Basophils Relative 06/16/2019 0  % Final  . Basophils Absolute 06/16/2019 0.0  0.0 - 0.1 K/uL Final  . Immature Granulocytes 06/16/2019 0  % Final  . Abs Immature Granulocytes 06/16/2019  0.03  0.00 - 0.07  K/uL Final   Performed at [facility], [address], [place], [place] [address]    Assessment:  Bridget Romero is a 73 y.o. 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 She receives IVIG monthly, methotrexate, and prednisone 10 mg a day.  CKhas been followed: >5555 on 05/30/2017, 4409 on 06/20/2017, 3546 on 07/05/2017, 2725 on 07/30/2017, 3675 on 08/28/2017, 2405 on 10/08/2017, 2743 on 11/08/2017, 2723 on 12/20/2017, 723 on 01/21/2018, 507 on 02/21/2018, 2192 on 04/11/2018, 2227 on 06/04/2018, 4468 on 07/22/2018, 2204 on 09/06/2018, 1360 on 12/02/2018, 1423 on 01/01/2019,912 on 03/03/2019, 3955 on 04/21/2019, 4612 on 05/06/2019, and 4560 on 05/20/2019.  Aldolasewas 67.2 in06/05/2017, 69.9 on06/27/2018, 64.8 on07/11/2017,57.6 on08/05/2017, 60.3 on09/03/2017, 38.7 on 12/20/2017, 9.3 on01/28/2019, 7.4 on02/28/2019, 29.1 on04/18/2019, to51.9 on07/29/2019, 23.7 on09/13/2019, 16.4 on 12/02/2018,14.1 on 01/01/2019, 8.8 on 03/03/2019, 53.4 on 05/06/2019 and 42.9 on 05/20/2019.  She received IVIG (Privigen) 75 gm x 2 days every 4 weeks beginning 12/07/2017 (last 2 day infusion 04/21/2019- 04/22/2019).IVIG was decreased to every 8 weeks on 04/22/2019, but hen returned to monthly secondary to rise CK.  Last IVIG was 04/21/2019.  She has a normocytic anemia. Baseline hemoglobin is 10.Last colonoscopy on 03/07/2016 revealed grade I hemorrhoids.She has B12 deficiencyand is on oral B12. B12 was 306 and folate >22.3 on 05/30/2017.   She has chronicmild elevation in LFTs.Hepatitis B and C testing werenegative on 06/20/2017.  Symptomatically, she notes that the back of her legs is a little tight.  She has trouble lifting her grandson.  Plan: 1. Labs today:  CBC with diff, CMP, CK, aldolase. 2. Polymyositis CK 912on 03/03/2019, 3955 on 04/21/2019, 4560 on 05/20/2019, 4532 today. Labs discussed with Dr. [name]. No  IVIG today.             She is currently on is on 10 mg a day and methotrexate 6 pills/week. She is scheduled to see Dr. [name] on 06/19/2019. 3.Elevated LFTs AST 157 and ALT 243 on 05/30/2017. AST 53 and ALT 54 on 02/17/2019. AST 107 and ALT 128 on 04/21/2019.             AST 134 and ALT 200 on 05/20/2019.  AST 137 and ALT 178 on 06/16/2019. Increased liver function tests have correlated with increased CPK.  CPK reflects activity of polymyositis. As above, no IVIG today. 4. RTC prn.  I discussed the assessment and treatment plan with the patient.  The patient was provided an opportunity to ask questions and all were answered.  The patient agreed with the plan and demonstrated an understanding of the instructions.  The patient was advised to call back if the symptoms worsen or if the condition fails to improve as anticipated.   Lequita Asal, MD, PhD    06/16/2019, 8:58 AM  I, Selena Batten, am acting as scribe for [employer]. Mike Gip, MD, PhD.  I, Melissa C. Mike Gip, MD, have reviewed the above documentation for accuracy and completeness, and I agree with the above.

## 2019-06-13 ENCOUNTER — Other Ambulatory Visit: Payer: Self-pay

## 2019-06-16 ENCOUNTER — Encounter: Payer: Self-pay | Admitting: Hematology and Oncology

## 2019-06-16 ENCOUNTER — Inpatient Hospital Stay: Payer: Medicare Other | Attending: Hematology and Oncology

## 2019-06-16 ENCOUNTER — Inpatient Hospital Stay (HOSPITAL_BASED_OUTPATIENT_CLINIC_OR_DEPARTMENT_OTHER): Payer: Medicare Other | Admitting: Hematology and Oncology

## 2019-06-16 ENCOUNTER — Inpatient Hospital Stay: Payer: Medicare Other

## 2019-06-16 ENCOUNTER — Other Ambulatory Visit: Payer: Self-pay

## 2019-06-16 VITALS — BP 155/67 | HR 73 | Temp 98.5°F | Resp 18 | Ht 65.0 in | Wt 186.0 lb

## 2019-06-16 DIAGNOSIS — R7989 Other specified abnormal findings of blood chemistry: Secondary | ICD-10-CM

## 2019-06-16 DIAGNOSIS — M3320 Polymyositis, organ involvement unspecified: Secondary | ICD-10-CM | POA: Insufficient documentation

## 2019-06-16 LAB — COMPREHENSIVE METABOLIC PANEL
ALT: 178 U/L — ABNORMAL HIGH (ref 0–44)
AST: 137 U/L — ABNORMAL HIGH (ref 15–41)
Albumin: 3.6 g/dL (ref 3.5–5.0)
Alkaline Phosphatase: 44 U/L (ref 38–126)
Anion gap: 9 (ref 5–15)
BUN: 19 mg/dL (ref 8–23)
CO2: 24 mmol/L (ref 22–32)
Calcium: 9.3 mg/dL (ref 8.9–10.3)
Chloride: 104 mmol/L (ref 98–111)
Creatinine, Ser: 0.67 mg/dL (ref 0.44–1.00)
GFR calc Af Amer: >60 mL/min (ref >60)
GFR calc non Af Amer: >60 mL/min (ref >60)
Glucose, Bld: 228 mg/dL — ABNORMAL HIGH (ref 70–99)
Potassium: 3.8 mmol/L (ref 3.5–5.1)
Sodium: 137 mmol/L (ref 135–145)
Total Bilirubin: 0.7 mg/dL (ref 0.3–1.2)
Total Protein: 6.8 g/dL (ref 6.5–8.1)

## 2019-06-16 LAB — CBC WITH DIFFERENTIAL/PLATELET
Abs Immature Granulocytes: 0.03 10*3/uL (ref 0.00–0.07)
Basophils Absolute: 0 10*3/uL (ref 0.0–0.1)
Basophils Relative: 0 %
Eosinophils Absolute: 0.1 10*3/uL (ref 0.0–0.5)
Eosinophils Relative: 2 %
HCT: 31.6 % — ABNORMAL LOW (ref 36.0–46.0)
Hemoglobin: 10.2 g/dL — ABNORMAL LOW (ref 12.0–15.0)
Immature Granulocytes: 0 %
Lymphocytes Relative: 23 %
Lymphs Abs: 1.8 10*3/uL (ref 0.7–4.0)
MCH: 30.7 pg (ref 26.0–34.0)
MCHC: 32.3 g/dL (ref 30.0–36.0)
MCV: 95.2 fL (ref 80.0–100.0)
Monocytes Absolute: 0.3 10*3/uL (ref 0.1–1.0)
Monocytes Relative: 4 %
Neutro Abs: 5.8 10*3/uL (ref 1.7–7.7)
Neutrophils Relative %: 71 %
Platelets: 243 10*3/uL (ref 150–400)
RBC: 3.32 MIL/uL — ABNORMAL LOW (ref 3.87–5.11)
RDW: 13.9 % (ref 11.5–15.5)
WBC: 8.2 10*3/uL (ref 4.0–10.5)
nRBC: 0 % (ref 0.0–0.2)

## 2019-06-16 LAB — CK: Total CK: 4532 U/L — ABNORMAL HIGH (ref 38–234)

## 2019-06-16 NOTE — Progress Notes (Signed)
No new changes noted today 

## 2019-06-17 ENCOUNTER — Ambulatory Visit: Payer: Medicare Other

## 2019-06-17 ENCOUNTER — Ambulatory Visit: Payer: Medicare Other | Admitting: Hematology and Oncology

## 2019-06-17 ENCOUNTER — Other Ambulatory Visit: Payer: Medicare Other

## 2019-06-17 LAB — ALDOLASE: Aldolase: 39 U/L — ABNORMAL HIGH (ref 3.3–10.3)

## 2019-06-18 ENCOUNTER — Ambulatory Visit: Payer: Medicare Other

## 2019-07-03 ENCOUNTER — Telehealth: Payer: Self-pay

## 2019-07-03 NOTE — Telephone Encounter (Signed)
Pt left a vm on triage line. She is now on 40mg  of prednisone. Blood sugar is going up. She is asking what she needs to do.  I called her to fine out what her numbers. She is currently on Lantus 10units and 500mg  metformin in am and then 500mg . Eyes are blurry. Average FBS is just over 200.

## 2019-07-03 NOTE — Telephone Encounter (Signed)
She is back on a higher dose of prednisone for now---off the other injection medication and Dr [name] is looking into other alternatives (with specialists at [facility]) For now, I asked her to take and extra 10 units of lantus now, and increase to 20 daily tomorrow. If she stays over 150 fasting, she should increase by 2-3 units every couple of days  [place], Please check on her on Monday to see how it is going. She may be on the higher dose of prednisone for some time

## 2019-07-07 NOTE — Telephone Encounter (Signed)
Have her cut back the insulin to 15 units--then continue to monitor

## 2019-07-07 NOTE — Telephone Encounter (Signed)
Spoke to pt. She will decrease the lantus and let us know if she has to increase it again.

## 2019-07-07 NOTE — Telephone Encounter (Signed)
Spoke to pt. She said her FBS is in the 70s-80s. She is up to 20 units of Lantus and metformin twice a day. She feels a little off. Thinks it might be the prednisone.

## 2019-07-09 LAB — HM DIABETES EYE EXAM

## 2019-07-22 ENCOUNTER — Other Ambulatory Visit: Payer: Self-pay

## 2019-07-22 ENCOUNTER — Other Ambulatory Visit: Payer: Self-pay | Admitting: Hematology and Oncology

## 2019-07-22 DIAGNOSIS — M3320 Polymyositis, organ involvement unspecified: Secondary | ICD-10-CM

## 2019-07-22 DIAGNOSIS — Z79899 Other long term (current) drug therapy: Secondary | ICD-10-CM

## 2019-07-23 ENCOUNTER — Ambulatory Visit: Payer: Medicare Other

## 2019-07-23 ENCOUNTER — Ambulatory Visit: Payer: Medicare Other | Admitting: Hematology and Oncology

## 2019-07-23 ENCOUNTER — Other Ambulatory Visit: Payer: Medicare Other

## 2019-07-25 NOTE — Progress Notes (Signed)
[facility]  [address], [address] [place], [place] [address] Phone: [phone]  Fax: [phone]   Clinic Day:  07/28/2019  Referring physician: [name], MD  Chief Complaint: Bridget Romero is a 73 y.o. female  with polymyositis who is seen for reassessment and continuation ofmonthlyIVIG.  HPI: The patient was last seen in the hematology clinic on 06/20/2019. At that time, she noted that the back of her legs were a little tight.  She had trouble lifting her grandson.  CK was 4532.  Decision was made to postpone IVIG secondary to increasing CK.  The patient was subsequently seen by Dr. [name] in the rheumatology clinic. She continued on 40mg  prednisone daily. She was started on methotrexate (0.8) once weekly, injected at home, on 07/20/2019.  During the interim, she is doing okay. She notes difficulty getting up from a chair and into her car, which began 1 month ago. She has tightness in the back of her legs with increased edema. She reports her legs fells heavy. She denies any fevers, sweats, weight loss, chest pain, shortness of breath, bowel issues, or urinary issues.   She denies any issues with methotrexate. She notes increased blood sugar due to prednisone, controlled with insulin.    Past Medical History:  Diagnosis Date   Allergy    Asthma    Diabetes mellitus    Hyperlipidemia    Hypertension    Neuromuscular disorder ([place])    polymyositis   Osteoarthritis    Vitamin B12 deficiency     Past Surgical History:  Procedure Laterality Date   COLONOSCOPY WITH PROPOFOL N/A 03/07/2016   Procedure: COLONOSCOPY WITH PROPOFOL;  Surgeon: Lollie Sails, MD;  Location: [facility] ENDOSCOPY;  Service: Endoscopy;  Laterality: N/A;    Family History  Problem Relation Age of Onset   Heart disease Mother    Cancer Father     Social History:  reports that she has quit smoking. She has never used smokeless tobacco. She reports  that she does not drink alcohol or use drugs. She is a Training and development officer. She lives in [place]. The patient is alone today.  Allergies:  Allergies  Allergen Reactions   Atorvastatin Other (See Comments)    myalgias   Pravastatin Other (See Comments)    Muscle pain    Current Medications: Current Outpatient Medications  Medication Sig Dispense Refill   ACCU-CHEK FASTCLIX LANCETS MISC Use to test blood sugar twice daily dx: 250.02 100 each 3   acetaminophen (TYLENOL) 500 MG tablet Take 500 mg by mouth every 6 (six) hours as needed.     Alcohol Swabs (B-D SINGLE USE SWABS REGULAR) PADS Use to test blood sugar once daily dx: 250.00 100 each 3   alendronate (FOSAMAX) 70 MG tablet TAKE 1 TABLET EVERY 7 DAYS WITH A FULL GLASS OF WATER.(DO NOT LIE DOWN FOR THE NEXT 30 MINUTES ) 12 tablet 3   atorvastatin (LIPITOR) 10 MG tablet Take 1 tablet (10 mg total) by mouth daily. 90 tablet 3   azaTHIOprine (IMURAN) 50 MG tablet take 3.5 tablets by mouth once daily (Patient taking differently: Take 125 mg by mouth daily. take 2.5 tablets by mouth once daily) 105 tablet 0   Cholecalciferol (VITAMIN D) 1000 UNITS capsule Take 1,000 Units by mouth daily.       folic acid (FOLVITE) 1 MG tablet Take 1 mg by mouth daily.     gabapentin (NEURONTIN) 300 MG capsule Take 300 mg by mouth 2 (two)  times daily.     glucose blood (ACCU-CHEK SMARTVIEW) test strip Use to check blood sugar twice a day Dx Code E11.4 100 each 6   insulin glargine (LANTUS) 100 UNIT/ML injection Inject 0.1-0.2 mLs (10-20 Units total) into the skin daily. 10 mL 11   lisinopril (PRINIVIL,ZESTRIL) 10 MG tablet TAKE 1 TABLET BY MOUTH EVERY DAY 90 tablet 1   metFORMIN (GLUCOPHAGE) 1000 MG tablet TAKE 1 TABLET BY MOUTH TWICE DAILY 60 tablet 11   methotrexate 50 MG/2ML injection Inject into the skin.     Multiple Vitamin (MULTIVITAMIN) tablet Take 1 tablet by mouth daily.       predniSONE (DELTASONE) 5 MG tablet Take 10 mg by mouth daily with  breakfast.      albuterol (PROVENTIL HFA;VENTOLIN HFA) 108 (90 BASE) MCG/ACT inhaler Inhale 2 puffs into the lungs every 6 (six) hours as needed for wheezing or shortness of breath. (Patient not taking: Reported on 05/20/2019) 2 Inhaler 3   fluticasone (FLOVENT HFA) 110 MCG/ACT inhaler Inhale 2 puffs into the lungs daily. (Patient not taking: Reported on 05/20/2019) 12 g 3   methotrexate (RHEUMATREX) 2.5 MG tablet Take by mouth.     No current facility-administered medications for this visit.     Review of Systems  Constitutional: Positive for weight loss (1lb). Negative for chills, diaphoresis, fever and malaise/fatigue.       Doing well.  HENT: Negative for congestion, ear discharge, hearing loss, nosebleeds, sinus pain and sore throat.   Eyes: Negative.  Negative for blurred vision, double vision, photophobia and pain.  Respiratory: Negative.  Negative for cough, hemoptysis, sputum production and shortness of breath.   Cardiovascular: Positive for leg swelling (right foot edema). Negative for chest pain, palpitations, orthopnea, claudication and PND.  Gastrointestinal: Negative.  Negative for abdominal pain, blood in stool, constipation, diarrhea, heartburn, melena, nausea and vomiting.  Genitourinary: Negative.  Negative for dysuria, flank pain, frequency, hematuria and urgency.  Musculoskeletal: Positive for myalgias (legs, secondary to polymyositis, discomfort 5/10). Negative for back pain, falls, joint pain and neck pain.       Hard time getting up from a chair.  Legs feel heavy.  Skin: Negative.  Negative for itching and rash.  Neurological: Negative.  Negative for dizziness, tingling, tremors, sensory change, speech change, focal weakness, weakness and headaches.  Endo/Heme/Allergies: Negative.  Does not bruise/bleed easily.       Diabetes.  Blood sugar elevated on higher dose of prednisone.  Psychiatric/Behavioral: Negative.  Negative for depression and memory loss. The patient is  not nervous/anxious and does not have insomnia.   All other systems reviewed and are negative.  Performance status (ECOG): 1-2  Vitals Blood pressure (!) 133/59, pulse 89, temperature (!) 97.4 F (36.3 C), temperature source Tympanic, resp. rate 18, height 5\' 5"  (1.651 m), weight 184 lb 4.9 oz (83.6 kg), SpO2 100 %.   Physical Exam  Constitutional: She is oriented to person, place, and time. She appears well-developed and well-nourished. No distress.  Patient needs assistance getting onto exam table.  HENT:  Head: Normocephalic and atraumatic.  Mouth/Throat: Oropharynx is clear and moist. No oropharyngeal exudate.  Short curly dark hair.  Mask.  Eyes: Pupils are equal, round, and reactive to light. Conjunctivae are normal. No scleral icterus.  Brown eyes. Mask.  Neck: Normal range of motion. Neck supple. No JVD present.  Cardiovascular: Normal rate, regular rhythm and normal heart sounds. Exam reveals no gallop and no friction rub.  No murmur heard. Pulmonary/Chest: Effort  normal and breath sounds normal. No respiratory distress. She has no wheezes. She has no rales.  Abdominal: Soft. Bowel sounds are normal. She exhibits no distension and no mass. There is no abdominal tenderness. There is no rebound and no guarding.  Musculoskeletal:        General: No tenderness, deformity or edema.  Lymphadenopathy:    She has no cervical adenopathy.    She has no axillary adenopathy.       Right: No supraclavicular adenopathy present.       Left: No supraclavicular adenopathy present.  Neurological: She is alert and oriented to person, place, and time. She has normal strength. She displays no atrophy and no tremor. No sensory deficit. She exhibits normal muscle tone. Coordination normal.  Gait normal.  Able to left legs against gravity.  Skin: Skin is warm and dry. No rash noted. She is not diaphoretic. No erythema. No pallor.  Psychiatric: She has a normal mood and affect. Her behavior is  normal. Judgment and thought content normal.  Nursing note and vitals reviewed.   Appointment on 07/28/2019  Component Date Value Ref Range Status   Sodium 07/28/2019 137  135 - 145 mmol/L Final   Potassium 07/28/2019 3.8  3.5 - 5.1 mmol/L Final   Chloride 07/28/2019 101  98 - 111 mmol/L Final   CO2 07/28/2019 25  22 - 32 mmol/L Final   Glucose, Bld 07/28/2019 204* 70 - 99 mg/dL Final   BUN 07/28/2019 17  8 - 23 mg/dL Final   Creatinine, Ser 07/28/2019 0.73  0.44 - 1.00 mg/dL Final   Calcium 07/28/2019 9.4  8.9 - 10.3 mg/dL Final   Total Protein 07/28/2019 6.4* 6.5 - 8.1 g/dL Final   Albumin 07/28/2019 3.5  3.5 - 5.0 g/dL Final   AST 07/28/2019 151* 15 - 41 U/L Final   ALT 07/28/2019 304* 0 - 44 U/L Final   Alkaline Phosphatase 07/28/2019 45  38 - 126 U/L Final   Total Bilirubin 07/28/2019 0.9  0.3 - 1.2 mg/dL Final   GFR calc non Af Amer 07/28/2019 >60  >60 mL/min Final   GFR calc Af Amer 07/28/2019 >60  >60 mL/min Final   Anion gap 07/28/2019 11  5 - 15 Final   Performed at [facility], [address] [place], [place] [address]   WBC 07/28/2019 12.3* 4.0 - 10.5 K/uL Final   RBC 07/28/2019 3.72* 3.87 - 5.11 MIL/uL Final   Hemoglobin 07/28/2019 11.3* 12.0 - 15.0 g/dL Final   HCT 07/28/2019 33.7* 36.0 - 46.0 % Final   MCV 07/28/2019 90.6  80.0 - 100.0 fL Final   MCH 07/28/2019 30.4  26.0 - 34.0 pg Final   MCHC 07/28/2019 33.5  30.0 - 36.0 g/dL Final   RDW 07/28/2019 14.1  11.5 - 15.5 % Final   Platelets 07/28/2019 297  150 - 400 K/uL Final   nRBC 07/28/2019 0.0  0.0 - 0.2 % Final   Neutrophils Relative % 07/28/2019 70  % Final   Neutro Abs 07/28/2019 8.8* 1.7 - 7.7 K/uL Final   Lymphocytes Relative 07/28/2019 24  % Final   Lymphs Abs 07/28/2019 2.9  0.7 - 4.0 K/uL Final   Monocytes Relative 07/28/2019 4  % Final   Monocytes Absolute 07/28/2019 0.4  0.1 - 1.0 K/uL Final   Eosinophils Relative 07/28/2019 1  % Final   Eosinophils  Absolute 07/28/2019 0.1  0.0 - 0.5 K/uL Final   Basophils Relative 07/28/2019 0  %  Final   Basophils Absolute 07/28/2019 0.0  0.0 - 0.1 K/uL Final   Immature Granulocytes 07/28/2019 1  % Final   Abs Immature Granulocytes 07/28/2019 0.06  0.00 - 0.07 K/uL Final   Performed at [facility], [address] [place], [place] [address]    Assessment:  Bridget Romero is a 73 y.o. 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 She receives IVIG monthly,methotrexate, and prednisone10mg  a day.  CKhas been followed: >5555 on 05/30/2017, 4409 on 06/20/2017, 3546 on 07/05/2017, 2725 on 07/30/2017, 3675 on 08/28/2017, 2405 on 10/08/2017, 2743 on 11/08/2017, 2723 on 12/20/2017, 723 on 01/21/2018, 507 on 02/21/2018, 2192 on 04/11/2018, 2227 on 06/04/2018, 4468 on 07/22/2018, 2204 on 09/06/2018, 1360 on 12/02/2018, 1423 on 01/01/2019,912 on 03/03/2019, 3955 on 04/21/2019, 4612 on 05/06/2019, 4560 on 05/20/2019, 4,532 on 06/16/2019, and 4,330 on 07/28/2019.  Aldolasewas 67.2 in06/05/2017, 69.9 on06/27/2018, 64.8 on07/11/2017,57.6 on08/05/2017, 60.3 on09/03/2017, 38.7 on 12/20/2017, 9.3 on01/28/2019, 7.4 on02/28/2019, 29.1 on04/18/2019, to51.9 on07/29/2019, 23.7 on09/13/2019, 16.4 on 12/02/2018,14.1 on 01/01/2019, 8.8 on 03/03/2019, 53.4 on 05/06/2019, 42.9 on 05/20/2019, 39.0 in 06/16/2019, and 69.3 on 07/28/2019.   She received IVIG (Privigen) 75 gm x 2 days every 4 weeks beginning 12/07/2017.IVIG was decreased to every 8 weeks on 04/22/2019, but then returned to monthly secondary to rise CK.Last IVIG was 05/21/2019.  She has a normocytic anemia. Baseline hemoglobin is 10.Last colonoscopy on 03/07/2016 revealed grade I hemorrhoids.She has B12 deficiencyand is on oral B12. B12 was 306 and folate >22.3 on 05/30/2017.   She has chronicmild elevation in LFTs which have correlated with increased CPK.AST/ALT have been followed: 53/54 on 02/17/2019, 107/128 on 04/21/2019, 134/200 on  05/20/2019, and 151/304 on 07/28/2019.  Hepatitis B and C testing werenegative on 06/20/2017.  Symptomatically, she has become weaker.  She requires assistance onto the exam table.  Plan: 1.   Labs today: CBC with diff, CMP, CK, aldolase, hepatitis C antibody, hepatitis B surface antigen and core antibody.  2. Polymyositis Patient has progressive disease.  She is currently on prednisone 40 mg/day and MTX injections weekly.  CK 912on 03/03/2019,3955on 04/21/2019, 4560 on 05/20/2019, 4532 on 06/16/2019, and 4,330 today. Increase IVIG per Dr [name]   IVIG 2 gm/kg IV over 4 days   Patient consents to treatment.  Discussed with Dr [name].  Consideration of addition of Rituxan in next 2 months.   Check hepatitis serologies. 3.Elevated LFTs  AST 151 and ALT 304 on 07/28/2019. Increased liver function tests have correlated with increased CPK.  CPK reflects activity of polymyositis. Continue to monitor. 4.IVIG today and daily x 4 (total 4 days).  5.   RTC in 1 month for MD assessment, labs (CBC with diff, CMP, CK, aldolase), and IVIG x 4 days.   I discussed the assessment and treatment plan with the patient.  The patient was provided an opportunity to ask questions and all were answered.  The patient agreed with the plan and demonstrated an understanding of the instructions.  The patient was advised to call back if the symptoms worsen or if the condition fails to improve as anticipated.  I provided 15 minutes of face-to-face time during this this encounter and > 50% was spent counseling as documented under my assessment and plan.    Lequita Asal, MD, PhD    07/28/2019, 8:55 AM  I, Cloyde Reams Dorshimer, am acting as Education administrator for [employer]. Mike Gip, MD, PhD.  I, Geron Mulford C. Mike Gip, MD, have reviewed the above documentation for accuracy and completeness, and I agree with the  above.

## 2019-07-28 ENCOUNTER — Encounter: Payer: Self-pay | Admitting: Hematology and Oncology

## 2019-07-28 ENCOUNTER — Inpatient Hospital Stay (HOSPITAL_BASED_OUTPATIENT_CLINIC_OR_DEPARTMENT_OTHER): Payer: Medicare Other | Admitting: Hematology and Oncology

## 2019-07-28 ENCOUNTER — Inpatient Hospital Stay: Payer: Medicare Other | Attending: Hematology and Oncology

## 2019-07-28 ENCOUNTER — Other Ambulatory Visit: Payer: Self-pay

## 2019-07-28 ENCOUNTER — Inpatient Hospital Stay: Payer: Medicare Other

## 2019-07-28 VITALS — BP 143/77 | HR 85 | Temp 97.6°F | Resp 18

## 2019-07-28 VITALS — BP 133/59 | HR 89 | Temp 97.4°F | Resp 18 | Ht 65.0 in | Wt 184.3 lb

## 2019-07-28 DIAGNOSIS — M3320 Polymyositis, organ involvement unspecified: Secondary | ICD-10-CM

## 2019-07-28 DIAGNOSIS — Z79899 Other long term (current) drug therapy: Secondary | ICD-10-CM

## 2019-07-28 LAB — CBC WITH DIFFERENTIAL/PLATELET
Abs Immature Granulocytes: 0.06 10*3/uL (ref 0.00–0.07)
Basophils Absolute: 0 10*3/uL (ref 0.0–0.1)
Basophils Relative: 0 %
Eosinophils Absolute: 0.1 10*3/uL (ref 0.0–0.5)
Eosinophils Relative: 1 %
HCT: 33.7 % — ABNORMAL LOW (ref 36.0–46.0)
Hemoglobin: 11.3 g/dL — ABNORMAL LOW (ref 12.0–15.0)
Immature Granulocytes: 1 %
Lymphocytes Relative: 24 %
Lymphs Abs: 2.9 10*3/uL (ref 0.7–4.0)
MCH: 30.4 pg (ref 26.0–34.0)
MCHC: 33.5 g/dL (ref 30.0–36.0)
MCV: 90.6 fL (ref 80.0–100.0)
Monocytes Absolute: 0.4 10*3/uL (ref 0.1–1.0)
Monocytes Relative: 4 %
Neutro Abs: 8.8 10*3/uL — ABNORMAL HIGH (ref 1.7–7.7)
Neutrophils Relative %: 70 %
Platelets: 297 10*3/uL (ref 150–400)
RBC: 3.72 MIL/uL — ABNORMAL LOW (ref 3.87–5.11)
RDW: 14.1 % (ref 11.5–15.5)
WBC: 12.3 10*3/uL — ABNORMAL HIGH (ref 4.0–10.5)
nRBC: 0 % (ref 0.0–0.2)

## 2019-07-28 LAB — COMPREHENSIVE METABOLIC PANEL
ALT: 304 U/L — ABNORMAL HIGH (ref 0–44)
AST: 151 U/L — ABNORMAL HIGH (ref 15–41)
Albumin: 3.5 g/dL (ref 3.5–5.0)
Alkaline Phosphatase: 45 U/L (ref 38–126)
Anion gap: 11 (ref 5–15)
BUN: 17 mg/dL (ref 8–23)
CO2: 25 mmol/L (ref 22–32)
Calcium: 9.4 mg/dL (ref 8.9–10.3)
Chloride: 101 mmol/L (ref 98–111)
Creatinine, Ser: 0.73 mg/dL (ref 0.44–1.00)
GFR calc Af Amer: >60 mL/min (ref >60)
GFR calc non Af Amer: >60 mL/min (ref >60)
Glucose, Bld: 204 mg/dL — ABNORMAL HIGH (ref 70–99)
Potassium: 3.8 mmol/L (ref 3.5–5.1)
Sodium: 137 mmol/L (ref 135–145)
Total Bilirubin: 0.9 mg/dL (ref 0.3–1.2)
Total Protein: 6.4 g/dL — ABNORMAL LOW (ref 6.5–8.1)

## 2019-07-28 LAB — CK: Total CK: 4330 U/L — ABNORMAL HIGH (ref 38–234)

## 2019-07-28 MED ORDER — DIPHENHYDRAMINE HCL 25 MG PO TABS
25.0000 mg | ORAL_TABLET | Freq: Once | ORAL | Status: AC
  Administered 2019-07-28: 09:00:00 25 mg via ORAL
  Filled 2019-07-28: qty 1

## 2019-07-28 MED ORDER — ACETAMINOPHEN 325 MG PO TABS
650.0000 mg | ORAL_TABLET | Freq: Once | ORAL | Status: AC
  Administered 2019-07-28: 09:00:00 650 mg via ORAL
  Filled 2019-07-28: qty 2

## 2019-07-28 MED ORDER — DEXTROSE 5 % IV SOLN
Freq: Once | INTRAVENOUS | Status: AC
  Administered 2019-07-28: 09:00:00 via INTRAVENOUS
  Filled 2019-07-28: qty 250

## 2019-07-28 MED ORDER — IMMUNE GLOBULIN (HUMAN) 20 GM/200ML IV SOLN
500.0000 mg/kg | INTRAVENOUS | Status: DC
  Administered 2019-07-28: 40 g via INTRAVENOUS
  Filled 2019-07-28: qty 400

## 2019-07-28 MED ORDER — METHYLPREDNISOLONE SODIUM SUCC 125 MG IJ SOLR
40.0000 mg | INTRAMUSCULAR | Status: DC
  Administered 2019-07-28: 40 mg via INTRAVENOUS
  Filled 2019-07-28: qty 2

## 2019-07-28 NOTE — Progress Notes (Signed)
Patient c/o tightness to the back of her right leg with increase edema in her right foot

## 2019-07-28 NOTE — Patient Instructions (Signed)
Immune Globulin Injection What is this medicine? IMMUNE GLOBULIN (im MUNE GLOB yoo lin) helps to prevent or reduce the severity of certain infections in patients who are at risk. This medicine is collected from the pooled blood of many donors. It is used to treat immune system problems, thrombocytopenia, and Kawasaki syndrome. This medicine may be used for other purposes; ask your health care provider or pharmacist if you have questions. COMMON BRAND NAME(S): ASCENIV, Baygam, BIVIGAM, Carimune, Carimune NF, cutaquig, Cuvitru, Flebogamma, Flebogamma DIF, GamaSTAN, GamaSTAN S/D, Gamimune N, Gammagard, Gammagard S/D, Gammaked, Gammaplex, Gammar-P IV, Gamunex, Gamunex-C, Hizentra, Iveegam, Iveegam EN, Octagam, Panglobulin, Panglobulin NF, panzyga, Polygam S/D, Privigen, Sandoglobulin, Venoglobulin-S, Vigam, Vivaglobulin, Xembify What should I tell my health care provider before I take this medicine? They need to know if you have any of these conditions:   diabetes   extremely low or no immune antibodies in the blood   heart disease   history of blood clots   hyperprolinemia   infection in the blood, sepsis   kidney disease   taking medicine that may change kidney function - ask your health care provider about your medicine   an unusual or allergic reaction to human immune globulin, albumin, maltose, sucrose, polysorbate 80, other medicines, foods, dyes, or preservatives   pregnant or trying to get pregnant   breast-feeding How should I use this medicine? This medicine is for injection into a muscle or infusion into a vein or skin. It is usually given by a health care professional in a hospital or clinic setting. In rare cases, some brands of this medicine might be given at home. You will be taught how to give this medicine. Use exactly as directed. Take your medicine at regular intervals. Do not take your medicine more often than directed. Talk to your pediatrician regarding  the use of this medicine in children. Special care may be needed. Overdosage: If you think you have taken too much of this medicine contact a poison control center or emergency room at once. NOTE: This medicine is only for you. Do not share this medicine with others. What if I miss a dose? It is important not to miss your dose. Call your doctor or health care professional if you are unable to keep an appointment. If you give yourself the medicine and you miss a dose, take it as soon as you can. If it is almost time for your next dose, take only that dose. Do not take double or extra doses. What may interact with this medicine?  aspirin and aspirin-like medicines  cisplatin  cyclosporine  medicines for infection like acyclovir, adefovir, amphotericin B, bacitracin, cidofovir, foscarnet, ganciclovir, gentamicin, pentamidine, vancomycin  NSAIDS, medicines for pain and inflammation, like ibuprofen or naproxen  pamidronate  vaccines  zoledronic acid This list may not describe all possible interactions. Give your health care provider a list of all the medicines, herbs, non-prescription drugs, or dietary supplements you use. Also tell them if you smoke, drink alcohol, or use illegal drugs. Some items may interact with your medicine. What should I watch for while using this medicine? Your condition will be monitored carefully while you are receiving this medicine. This medicine is made from pooled blood donations of many different people. It may be possible to pass an infection in this medicine. However, the donors are screened for infections and all products are tested for HIV and hepatitis. The medicine is treated to kill most or all bacteria and viruses. Talk to your doctor about   the risks and benefits of this medicine. Do not have vaccinations for at least 14 days before, or until at least 3 months after receiving this medicine. What side effects may I notice from receiving this  medicine? Side effects that you should report to your doctor or health care professional as soon as possible:  allergic reactions like skin rash, itching or hives, swelling of the face, lips, or tongue  breathing problems  chest pain or tightness  fever, chills  headache with nausea, vomiting  neck pain or difficulty moving neck  pain when moving eyes  pain, swelling, warmth in the leg  problems with balance, talking, walking  sudden weight gain  swelling of the ankles, feet, hands  trouble passing urine or change in the amount of urine Side effects that usually do not require medical attention (report to your doctor or health care professional if they continue or are bothersome):  dizzy, drowsy  flushing  increased sweating  leg cramps  muscle aches and pains  pain at site where injected This list may not describe all possible side effects. Call your doctor for medical advice about side effects. You may report side effects to FDA at 1-800-FDA-1088. Where should I keep my medicine? Keep out of the reach of children. This drug is usually given in a hospital or clinic and will not be stored at home. In rare cases, some brands of this medicine may be given at home. If you are using this medicine at home, you will be instructed on how to store this medicine. Throw away any unused medicine after the expiration date on the label. NOTE: This sheet is a summary. It may not cover all possible information. If you have questions about this medicine, talk to your doctor, pharmacist, or health care provider.  2020 Elsevier/Gold Standard (2009-03-03 11:44:49)  

## 2019-07-29 ENCOUNTER — Inpatient Hospital Stay: Payer: Medicare Other

## 2019-07-29 VITALS — BP 145/80 | HR 75 | Temp 97.0°F | Resp 18

## 2019-07-29 DIAGNOSIS — M3320 Polymyositis, organ involvement unspecified: Secondary | ICD-10-CM

## 2019-07-29 LAB — HEPATITIS C ANTIBODY: HCV Ab: <0.1 s/co ratio (ref 0.0–0.9)

## 2019-07-29 LAB — ALDOLASE: Aldolase: 69.3 U/L — ABNORMAL HIGH (ref 3.3–10.3)

## 2019-07-29 LAB — HEPATITIS B CORE ANTIBODY, TOTAL: Hep B Core Total Ab: NEGATIVE

## 2019-07-29 LAB — HEPATITIS B SURFACE ANTIGEN: Hepatitis B Surface Ag: NEGATIVE

## 2019-07-29 MED ORDER — METHYLPREDNISOLONE SODIUM SUCC 125 MG IJ SOLR
40.0000 mg | INTRAMUSCULAR | Status: DC
  Administered 2019-07-29: 09:00:00 40 mg via INTRAVENOUS

## 2019-07-29 MED ORDER — IMMUNE GLOBULIN (HUMAN) 20 GM/200ML IV SOLN
500.0000 mg/kg | INTRAVENOUS | Status: DC
  Administered 2019-07-29: 40 g via INTRAVENOUS
  Filled 2019-07-29: qty 400

## 2019-07-29 MED ORDER — ACETAMINOPHEN 325 MG PO TABS
650.0000 mg | ORAL_TABLET | Freq: Once | ORAL | Status: AC
  Administered 2019-07-29: 09:00:00 650 mg via ORAL

## 2019-07-29 MED ORDER — DEXTROSE 5 % IV SOLN
Freq: Once | INTRAVENOUS | Status: AC
  Administered 2019-07-29: 09:00:00 via INTRAVENOUS
  Filled 2019-07-29: qty 250

## 2019-07-29 MED ORDER — DIPHENHYDRAMINE HCL 25 MG PO TABS
25.0000 mg | ORAL_TABLET | Freq: Once | ORAL | Status: AC
  Administered 2019-07-29: 09:00:00 25 mg via ORAL
  Filled 2019-07-29: qty 1

## 2019-07-30 ENCOUNTER — Inpatient Hospital Stay: Payer: Medicare Other

## 2019-07-30 ENCOUNTER — Other Ambulatory Visit: Payer: Self-pay

## 2019-07-30 VITALS — BP 154/76 | HR 82 | Temp 97.6°F | Resp 17

## 2019-07-30 DIAGNOSIS — M3320 Polymyositis, organ involvement unspecified: Secondary | ICD-10-CM

## 2019-07-30 MED ORDER — IMMUNE GLOBULIN (HUMAN) 20 GM/200ML IV SOLN
500.0000 mg/kg | INTRAVENOUS | Status: DC
  Administered 2019-07-30: 10:00:00 40 g via INTRAVENOUS
  Filled 2019-07-30: qty 400

## 2019-07-30 MED ORDER — METHYLPREDNISOLONE SODIUM SUCC 125 MG IJ SOLR
40.0000 mg | INTRAMUSCULAR | Status: DC
  Administered 2019-07-30: 09:00:00 40 mg via INTRAVENOUS
  Filled 2019-07-30: qty 2

## 2019-07-30 MED ORDER — DEXTROSE 5 % IV SOLN
Freq: Once | INTRAVENOUS | Status: AC
  Administered 2019-07-30: 09:00:00 via INTRAVENOUS
  Filled 2019-07-30: qty 250

## 2019-07-30 MED ORDER — ACETAMINOPHEN 325 MG PO TABS
650.0000 mg | ORAL_TABLET | Freq: Once | ORAL | Status: AC
  Administered 2019-07-30: 09:00:00 650 mg via ORAL
  Filled 2019-07-30: qty 2

## 2019-07-30 MED ORDER — DIPHENHYDRAMINE HCL 25 MG PO TABS
25.0000 mg | ORAL_TABLET | Freq: Once | ORAL | Status: AC
  Administered 2019-07-30: 09:00:00 25 mg via ORAL
  Filled 2019-07-30: qty 1

## 2019-07-31 ENCOUNTER — Inpatient Hospital Stay: Payer: Medicare Other

## 2019-07-31 VITALS — BP 148/75 | HR 82 | Temp 97.6°F | Resp 18

## 2019-07-31 DIAGNOSIS — M3320 Polymyositis, organ involvement unspecified: Secondary | ICD-10-CM | POA: Diagnosis not present

## 2019-07-31 MED ORDER — DEXTROSE 5 % IV SOLN
Freq: Once | INTRAVENOUS | Status: AC
  Administered 2019-07-31: 09:00:00 via INTRAVENOUS
  Filled 2019-07-31: qty 250

## 2019-07-31 MED ORDER — METHYLPREDNISOLONE SODIUM SUCC 125 MG IJ SOLR
40.0000 mg | INTRAMUSCULAR | Status: DC
  Administered 2019-07-31: 40 mg via INTRAVENOUS
  Filled 2019-07-31: qty 2

## 2019-07-31 MED ORDER — DIPHENHYDRAMINE HCL 25 MG PO TABS
25.0000 mg | ORAL_TABLET | Freq: Once | ORAL | Status: AC
  Administered 2019-07-31: 09:00:00 25 mg via ORAL
  Filled 2019-07-31: qty 1

## 2019-07-31 MED ORDER — ACETAMINOPHEN 325 MG PO TABS
650.0000 mg | ORAL_TABLET | Freq: Once | ORAL | Status: AC
  Administered 2019-07-31: 09:00:00 650 mg via ORAL
  Filled 2019-07-31: qty 2

## 2019-07-31 MED ORDER — IMMUNE GLOBULIN (HUMAN) 20 GM/200ML IV SOLN
500.0000 mg/kg | INTRAVENOUS | Status: DC
  Administered 2019-07-31: 09:00:00 40 g via INTRAVENOUS
  Filled 2019-07-31: qty 400

## 2019-08-01 ENCOUNTER — Inpatient Hospital Stay: Payer: Medicare Other

## 2019-08-24 NOTE — Progress Notes (Signed)
[facility]  [address], [address] [place], [place] [address] Phone: [phone]  Fax: [phone]   Clinic Day:  08/25/2019  Referring physician: [name], MD  Chief Complaint: Bridget Romero is a 73 y.o. female with polymyositiswho is seen for assessment and continuation ofmonthlyIVIG.  HPI: The patient was last seen in the hematology clinic on 07/28/2019. At that time, she had become weaker. She required assistance onto the exam table. She received 2 gm/kg of IVIG over 4 days.   She saw Dr [name] on 08/18/2019.  She had weekly she had weakness mainly in her legs but no shortness of breath or dysphasia.  She was able to do her housework and drive.  She had no falls.  CK was 3645.  AST was 175 and ALT 289.  Methotrexate was increased to 25 mg SQ weekly.  She continued on prednisone 40 mg a day.  Plan was to continue IVIG at 2 gm/kg.  During the interim, she has felt "pretty good".  She continues a prednisone 40 mg a day as well as methotrexate 25 mg subcu weekly.  She states that her legs are "better".  She still has trouble getting up from a chair.  Arm strength is good.  She notes that her blood sugar has been elevated requiring insulin because of her prednisone.  Weight is stable.   Past Medical History:  Diagnosis Date  . Allergy   . Asthma   . Diabetes mellitus   . Hyperlipidemia   . Hypertension   . Neuromuscular disorder (HCC)    polymyositis  . Osteoarthritis   . Vitamin B12 deficiency     Past Surgical History:  Procedure Laterality Date  . COLONOSCOPY WITH PROPOFOL N/A 03/07/2016   Procedure: COLONOSCOPY WITH PROPOFOL;  Surgeon: Lollie Sails, MD;  Location: [facility] ENDOSCOPY;  Service: Endoscopy;  Laterality: N/A;    Family History  Problem Relation Age of Onset  . Heart disease Mother   . Cancer Father     Social History:  reports that she has quit smoking. She has never used smokeless tobacco. She reports that she  does not drink alcohol or use drugs. She is a Training and development officer. She lives in [place]. The patient is alonetoday.  Allergies:  Allergies  Allergen Reactions  . Atorvastatin Other (See Comments)    myalgias  . Pravastatin Other (See Comments)    Muscle pain    Current Medications: Current Outpatient Medications  Medication Sig Dispense Refill  . ACCU-CHEK FASTCLIX LANCETS MISC Use to test blood sugar twice daily dx: 250.02 100 each 3  . Alcohol Swabs (B-D SINGLE USE SWABS REGULAR) PADS Use to test blood sugar once daily dx: 250.00 100 each 3  . alendronate (FOSAMAX) 70 MG tablet TAKE 1 TABLET EVERY 7 DAYS WITH A FULL GLASS OF WATER.(DO NOT LIE DOWN FOR THE NEXT 30 MINUTES ) 12 tablet 3  . atorvastatin (LIPITOR) 10 MG tablet Take 1 tablet (10 mg total) by mouth daily. 90 tablet 3  . azaTHIOprine (IMURAN) 50 MG tablet take 3.5 tablets by mouth once daily (Patient taking differently: Take 125 mg by mouth daily. take 2.5 tablets by mouth once daily) 105 tablet 0  . Cholecalciferol (VITAMIN D) 1000 UNITS capsule Take 1,000 Units by mouth daily.      . folic acid (FOLVITE) 1 MG tablet Take 1 mg by mouth daily.    Marland Kitchen gabapentin (NEURONTIN) 300 MG capsule Take 300 mg by mouth 2 (two) times  daily.    . glucose blood (ACCU-CHEK SMARTVIEW) test strip Use to check blood sugar twice a day Dx Code E11.4 100 each 6  . insulin glargine (LANTUS) 100 UNIT/ML injection Inject 0.1-0.2 mLs (10-20 Units total) into the skin daily. 10 mL 11  . lisinopril (PRINIVIL,ZESTRIL) 10 MG tablet TAKE 1 TABLET BY MOUTH EVERY DAY 90 tablet 1  . metFORMIN (GLUCOPHAGE) 1000 MG tablet TAKE 1 TABLET BY MOUTH TWICE DAILY 60 tablet 11  . methotrexate (RHEUMATREX) 2.5 MG tablet Take by mouth.    . methotrexate 50 MG/2ML injection Inject into the skin.    . Multiple Vitamin (MULTIVITAMIN) tablet Take 1 tablet by mouth daily.      . predniSONE (DELTASONE) 5 MG tablet Take 10 mg by mouth daily with breakfast.     . acetaminophen (TYLENOL)  500 MG tablet Take 500 mg by mouth every 6 (six) hours as needed.    Marland Kitchen albuterol (PROVENTIL HFA;VENTOLIN HFA) 108 (90 BASE) MCG/ACT inhaler Inhale 2 puffs into the lungs every 6 (six) hours as needed for wheezing or shortness of breath. (Patient not taking: Reported on 08/25/2019) 2 Inhaler 3  . fluticasone (FLOVENT HFA) 110 MCG/ACT inhaler Inhale 2 puffs into the lungs daily. (Patient not taking: Reported on 08/25/2019) 12 g 3   No current facility-administered medications for this visit.    Facility-Administered Medications Ordered in Other Visits  Medication Dose Route Frequency Provider Last Rate Last Dose  . Immune Globulin 10% (PRIVIGEN) IV infusion 40 g  500 mg/kg Intravenous Q24 Hr x 5 Corcoran, Melissa C, MD   40 g at 08/25/19 1035  . methylPREDNISolone sodium succinate (SOLU-MEDROL) 125 mg/2 mL injection 40 mg  40 mg Intravenous Q24H Nolon Stalls C, MD   40 mg at 08/25/19 [id number]    Review of Systems  Constitutional: Negative.  Negative for chills, diaphoresis, fever, malaise/fatigue and weight loss (stable).       Feels "pretty good".  HENT: Negative.  Negative for congestion, ear discharge, ear pain, hearing loss, sinus pain and sore throat.   Eyes: Negative.  Negative for blurred vision, double vision, photophobia and pain.  Respiratory: Negative.  Negative for cough, hemoptysis, sputum production and shortness of breath.   Cardiovascular: Negative for chest pain, palpitations, orthopnea, claudication, leg swelling and PND.  Gastrointestinal: Negative.  Negative for abdominal pain, blood in stool, constipation, diarrhea, heartburn, melena, nausea and vomiting.  Genitourinary: Negative.  Negative for dysuria, flank pain, hematuria and urgency.  Musculoskeletal: Positive for myalgias (legs, secondary to polymyositis). Negative for back pain, falls, joint pain and neck pain.       Hard time getting up from a chair.  Legs feel "better" since last visit.  Skin: Negative.  Negative for  itching and rash.  Neurological: Positive for weakness (in legs, improved). Negative for dizziness, tingling, tremors, sensory change, speech change, focal weakness and headaches.  Endo/Heme/Allergies: Negative.  Does not bruise/bleed easily.       Diabetes on insulin and Metformin.  Blood sugar elevated on prednisone.  Psychiatric/Behavioral: Negative.  Negative for depression and memory loss. The patient is not nervous/anxious and does not have insomnia.   All other systems reviewed and are negative.  Performance status (ECOG): 2  Vitals Blood pressure (!) 144/75, pulse 88, temperature 97.8 F (36.6 C), temperature source Tympanic, resp. rate 18, height 5\' 5"  (1.651 m), weight 184 lb 8.4 oz (83.7 kg), SpO2 100 %.   Physical Exam  Constitutional: She is oriented to person, place,  and time. She appears well-developed and well-nourished. No distress.  Patient requires assistance getting on and off exam table.  HENT:  Head: Normocephalic and atraumatic.  Mouth/Throat: No oropharyngeal exudate.  Short curly dark hair.  Mask.  Eyes: Pupils are equal, round, and reactive to light. Conjunctivae and EOM are normal. No scleral icterus.  Brown eyes.   Neck: Neck supple. No JVD present.  Cardiovascular: Regular rhythm. Exam reveals no gallop and no friction rub.  No murmur heard. Pulmonary/Chest: Effort normal and breath sounds normal. No respiratory distress. She has no wheezes. She has no rales.  Abdominal: Soft. Bowel sounds are normal. She exhibits no distension and no mass. There is no abdominal tenderness. There is no rebound and no guarding.  Musculoskeletal:        General: No tenderness, deformity or edema.     Comments: Rocks back and forth to get up from chair.  Lymphadenopathy:    She has no cervical adenopathy.    She has no axillary adenopathy.       Right: No supraclavicular adenopathy present.       Left: No supraclavicular adenopathy present.  Neurological: She is alert and  oriented to person, place, and time. She has normal strength. She displays no atrophy and no tremor. No cranial nerve deficit or sensory deficit. She exhibits normal muscle tone. Coordination normal.  Gait normal.  Able to left legs against gravity.  Skin: Skin is warm and dry. No rash noted. She is not diaphoretic. No erythema. No pallor.  Psychiatric: She has a normal mood and affect. Her behavior is normal. Judgment and thought content normal.  Nursing note and vitals reviewed.   Appointment on 08/25/2019  Component Date Value Ref Range Status  . Total CK 08/25/2019 2,514* 38 - 234 U/L Final   Performed at [facility], [address] [place], [place] [address]  . Sodium 08/25/2019 138  135 - 145 mmol/L Final  . Potassium 08/25/2019 3.6  3.5 - 5.1 mmol/L Final  . Chloride 08/25/2019 103  98 - 111 mmol/L Final  . CO2 08/25/2019 25  22 - 32 mmol/L Final  . Glucose, Bld 08/25/2019 119* 70 - 99 mg/dL Final  . BUN 08/25/2019 15  8 - 23 mg/dL Final  . Creatinine, Ser 08/25/2019 0.53  0.44 - 1.00 mg/dL Final  . Calcium 08/25/2019 9.1  8.9 - 10.3 mg/dL Final  . Total Protein 08/25/2019 6.5  6.5 - 8.1 g/dL Final  . Albumin 08/25/2019 3.4* 3.5 - 5.0 g/dL Final  . AST 08/25/2019 102* 15 - 41 U/L Final  . ALT 08/25/2019 196* 0 - 44 U/L Final  . Alkaline Phosphatase 08/25/2019 40  38 - 126 U/L Final  . Total Bilirubin 08/25/2019 0.7  0.3 - 1.2 mg/dL Final  . GFR calc non Af Amer 08/25/2019 >60  >60 mL/min Final  . GFR calc Af Amer 08/25/2019 >60  >60 mL/min Final  . Anion gap 08/25/2019 10  5 - 15 Final   Performed at [facility], [address], [place], [place] [address]  . WBC 08/25/2019 12.1* 4.0 - 10.5 K/uL Final  . RBC 08/25/2019 3.54* 3.87 - 5.11 MIL/uL Final  . Hemoglobin 08/25/2019 10.4* 12.0 - 15.0 g/dL Final  . HCT 08/25/2019 32.9* 36.0 - 46.0 % Final  . MCV 08/25/2019 92.9  80.0 - 100.0 fL Final  . MCH 08/25/2019 29.4  26.0 - 34.0 pg Final  . MCHC  08/25/2019 31.6  30.0 -  36.0 g/dL Final  . RDW 08/25/2019 14.4  11.5 - 15.5 % Final  . Platelets 08/25/2019 284  150 - 400 K/uL Final  . nRBC 08/25/2019 0.0  0.0 - 0.2 % Final  . Neutrophils Relative % 08/25/2019 76  % Final  . Neutro Abs 08/25/2019 9.3* 1.7 - 7.7 K/uL Final  . Lymphocytes Relative 08/25/2019 19  % Final  . Lymphs Abs 08/25/2019 2.2  0.7 - 4.0 K/uL Final  . Monocytes Relative 08/25/2019 4  % Final  . Monocytes Absolute 08/25/2019 0.5  0.1 - 1.0 K/uL Final  . Eosinophils Relative 08/25/2019 0  % Final  . Eosinophils Absolute 08/25/2019 0.0  0.0 - 0.5 K/uL Final  . Basophils Relative 08/25/2019 0  % Final  . Basophils Absolute 08/25/2019 0.0  0.0 - 0.1 K/uL Final  . Immature Granulocytes 08/25/2019 1  % Final  . Abs Immature Granulocytes 08/25/2019 0.07  0.00 - 0.07 K/uL Final   Performed at [facility], [address], [place],  [address]    Assessment:  Bridget Romero is a 73 y.o. 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 She receives IVIG monthly,methotrexate, and prednisone10mg  a day.  CKhas been followed: >5555 on 05/30/2017, 4409 on 06/20/2017, 3546 on 07/05/2017, 2725 on 07/30/2017, 3675 on 08/28/2017, 2405 on 10/08/2017, 2743 on 11/08/2017, 2723 on 12/20/2017, 723 on 01/21/2018, 507 on 02/21/2018, 2192 on 04/11/2018, 2227 on 06/04/2018, 4468 on 07/22/2018, 2204 on 09/06/2018, 1360 on 12/02/2018, 1423 on 01/01/2019,912 on 03/03/2019, 3955 on 04/21/2019,4612 on 05/06/2019,4560 on 05/20/2019, 4532 on 06/16/2019, 4935 on 07/10/2019, 4330 on 07/28/2019, 3645 on 08/13/2019, and 2514 on 08/25/2019.  Aldolasewas 67.2 in06/05/2017, 69.9 on06/27/2018, 64.8 on07/11/2017,57.6 on08/05/2017, 60.3 on09/03/2017, 38.7 on 12/20/2017, 9.3 on01/28/2019, 7.4 on02/28/2019, 29.1 on04/18/2019, to51.9 on07/29/2019, 23.7 on09/13/2019, 16.4 on 12/02/2018,14.1 on 01/01/2019,8.8 on 03/03/2019, 53.4 on 05/06/2019, 42.9 on 05/20/2019, 39.0 in 06/16/2019, and 69.3 on  07/28/2019.   She received IVIG (Privigen) 75 gm x 2 days every 4 weeks beginning 12/07/2017.IVIG was decreased to every 8 weeks on 04/22/2019, but then returned to monthly secondary to rise CK.Last IVIG was 07/28/2019 (2 gm/kg).  She has a normocytic anemia. Baseline hemoglobin is 10.Last colonoscopy on 03/07/2016 revealed grade I hemorrhoids.She has B12 deficiencyand is on oral B12. B12 was 306 and folate >22.3 on 05/30/2017.   She has chronicmild elevation in LFTs which have correlated with increased CPK.AST/ALT have been followed: 53/54 on 02/17/2019, 107/128 on 04/21/2019, 134/200 on 05/20/2019, 138/261 on 07/10/2019, 151/304 on 07/28/2019, 175/289 on 08/13/2019.  Hepatitis B and C testing werenegative on 06/20/2017 and 07/28/2019.  Symptomatically, she is doing slightly better.  Weakness in her legs has slightly improved.  Plan: 1.   Labs today:  CBC with diff, CMP, CK, aldolase. 2. Polymyositis Clinically, she has improved slightly since increasing IVIG to 2 gm/kg monthly.             She is currently on prednisone 40 mg/day and MTX 25 mg injections weekly.             CK 4330 on 07/28/2019 and 2514 on 08/25/2019.  Follow-up pending aldolase. Patient being considered for Rituxan in the future.                         Hepatitis B and C serologies were negative on 07/28/2019. 3.Elevated LFTs             AST 151 and ALT 304 on 07/28/2019. AST 102 and ALT 196 on 07/28/2019.  Increasedliver function tests correlates with increased CPK. CPK reflects activityofpolymyositis. Continue to monitor closely. 4.Begin IVIG 2 gm/kg (500 mg/kg per dose daily x 4).  5.   RTC in 1 month for MD assessment, labs (CBC with differential, CMP, CK, aldolase), and IVIG over 4 days.   I discussed the assessment and treatment plan with the patient.  The patient was provided an opportunity to ask questions and all were  answered.  The patient agreed with the plan and demonstrated an understanding of the instructions.  The patient was advised to call back if the symptoms worsen or if the condition fails to improve as anticipated.   Lequita Asal, MD, PhD    08/25/2019, 1:15 PM

## 2019-08-25 ENCOUNTER — Inpatient Hospital Stay: Payer: Medicare Other

## 2019-08-25 ENCOUNTER — Other Ambulatory Visit: Payer: Self-pay

## 2019-08-25 ENCOUNTER — Encounter: Payer: Self-pay | Admitting: Hematology and Oncology

## 2019-08-25 ENCOUNTER — Inpatient Hospital Stay (HOSPITAL_BASED_OUTPATIENT_CLINIC_OR_DEPARTMENT_OTHER): Payer: Medicare Other | Admitting: Hematology and Oncology

## 2019-08-25 VITALS — BP 144/75 | HR 88 | Temp 97.8°F | Resp 18 | Ht 65.0 in | Wt 184.5 lb

## 2019-08-25 VITALS — BP 158/76 | HR 89 | Temp 96.9°F | Resp 20

## 2019-08-25 DIAGNOSIS — M3320 Polymyositis, organ involvement unspecified: Secondary | ICD-10-CM

## 2019-08-25 DIAGNOSIS — R7989 Other specified abnormal findings of blood chemistry: Secondary | ICD-10-CM

## 2019-08-25 DIAGNOSIS — R945 Abnormal results of liver function studies: Secondary | ICD-10-CM | POA: Diagnosis not present

## 2019-08-25 LAB — CBC WITH DIFFERENTIAL/PLATELET
Abs Immature Granulocytes: 0.07 10*3/uL (ref 0.00–0.07)
Basophils Absolute: 0 10*3/uL (ref 0.0–0.1)
Basophils Relative: 0 %
Eosinophils Absolute: 0 10*3/uL (ref 0.0–0.5)
Eosinophils Relative: 0 %
HCT: 32.9 % — ABNORMAL LOW (ref 36.0–46.0)
Hemoglobin: 10.4 g/dL — ABNORMAL LOW (ref 12.0–15.0)
Immature Granulocytes: 1 %
Lymphocytes Relative: 19 %
Lymphs Abs: 2.2 10*3/uL (ref 0.7–4.0)
MCH: 29.4 pg (ref 26.0–34.0)
MCHC: 31.6 g/dL (ref 30.0–36.0)
MCV: 92.9 fL (ref 80.0–100.0)
Monocytes Absolute: 0.5 10*3/uL (ref 0.1–1.0)
Monocytes Relative: 4 %
Neutro Abs: 9.3 10*3/uL — ABNORMAL HIGH (ref 1.7–7.7)
Neutrophils Relative %: 76 %
Platelets: 284 10*3/uL (ref 150–400)
RBC: 3.54 MIL/uL — ABNORMAL LOW (ref 3.87–5.11)
RDW: 14.4 % (ref 11.5–15.5)
WBC: 12.1 10*3/uL — ABNORMAL HIGH (ref 4.0–10.5)
nRBC: 0 % (ref 0.0–0.2)

## 2019-08-25 LAB — COMPREHENSIVE METABOLIC PANEL
ALT: 196 U/L — ABNORMAL HIGH (ref 0–44)
AST: 102 U/L — ABNORMAL HIGH (ref 15–41)
Albumin: 3.4 g/dL — ABNORMAL LOW (ref 3.5–5.0)
Alkaline Phosphatase: 40 U/L (ref 38–126)
Anion gap: 10 (ref 5–15)
BUN: 15 mg/dL (ref 8–23)
CO2: 25 mmol/L (ref 22–32)
Calcium: 9.1 mg/dL (ref 8.9–10.3)
Chloride: 103 mmol/L (ref 98–111)
Creatinine, Ser: 0.53 mg/dL (ref 0.44–1.00)
GFR calc Af Amer: >60 mL/min (ref >60)
GFR calc non Af Amer: >60 mL/min (ref >60)
Glucose, Bld: 119 mg/dL — ABNORMAL HIGH (ref 70–99)
Potassium: 3.6 mmol/L (ref 3.5–5.1)
Sodium: 138 mmol/L (ref 135–145)
Total Bilirubin: 0.7 mg/dL (ref 0.3–1.2)
Total Protein: 6.5 g/dL (ref 6.5–8.1)

## 2019-08-25 LAB — CK: Total CK: 2514 U/L — ABNORMAL HIGH (ref 38–234)

## 2019-08-25 MED ORDER — ACETAMINOPHEN 325 MG PO TABS
650.0000 mg | ORAL_TABLET | Freq: Once | ORAL | Status: AC
  Administered 2019-08-25: 10:00:00 650 mg via ORAL
  Filled 2019-08-25: qty 2

## 2019-08-25 MED ORDER — METHYLPREDNISOLONE SODIUM SUCC 125 MG IJ SOLR
40.0000 mg | INTRAMUSCULAR | Status: DC
  Administered 2019-08-25: 40 mg via INTRAVENOUS
  Filled 2019-08-25: qty 2

## 2019-08-25 MED ORDER — DIPHENHYDRAMINE HCL 25 MG PO TABS
25.0000 mg | ORAL_TABLET | Freq: Once | ORAL | Status: AC
  Administered 2019-08-25: 10:00:00 25 mg via ORAL
  Filled 2019-08-25: qty 1

## 2019-08-25 MED ORDER — IMMUNE GLOBULIN (HUMAN) 20 GM/200ML IV SOLN
500.0000 mg/kg | INTRAVENOUS | Status: DC
  Administered 2019-08-25: 11:00:00 40 g via INTRAVENOUS
  Filled 2019-08-25: qty 400

## 2019-08-25 MED ORDER — DEXTROSE 5 % IV SOLN
Freq: Once | INTRAVENOUS | Status: AC
  Administered 2019-08-25: 10:00:00 via INTRAVENOUS
  Filled 2019-08-25: qty 250

## 2019-08-25 MED ORDER — DIPHENHYDRAMINE HCL 25 MG PO CAPS
ORAL_CAPSULE | ORAL | Status: AC
  Filled 2019-08-25: qty 1

## 2019-08-25 NOTE — Progress Notes (Signed)
No new changes noted today 

## 2019-08-26 ENCOUNTER — Other Ambulatory Visit: Payer: Self-pay

## 2019-08-26 ENCOUNTER — Inpatient Hospital Stay: Payer: Medicare Other | Attending: Hematology and Oncology

## 2019-08-26 VITALS — BP 150/81 | HR 112 | Temp 97.0°F | Resp 18

## 2019-08-26 DIAGNOSIS — M3320 Polymyositis, organ involvement unspecified: Secondary | ICD-10-CM | POA: Insufficient documentation

## 2019-08-26 MED ORDER — METHYLPREDNISOLONE SODIUM SUCC 125 MG IJ SOLR
40.0000 mg | INTRAMUSCULAR | Status: DC
  Administered 2019-08-26: 40 mg via INTRAVENOUS
  Filled 2019-08-26: qty 2

## 2019-08-26 MED ORDER — IMMUNE GLOBULIN (HUMAN) 20 GM/200ML IV SOLN
500.0000 mg/kg | INTRAVENOUS | Status: DC
  Administered 2019-08-26: 40 g via INTRAVENOUS
  Filled 2019-08-26: qty 400

## 2019-08-26 MED ORDER — DIPHENHYDRAMINE HCL 25 MG PO TABS
25.0000 mg | ORAL_TABLET | Freq: Once | ORAL | Status: AC
  Administered 2019-08-26: 25 mg via ORAL
  Filled 2019-08-26: qty 1

## 2019-08-26 MED ORDER — ACETAMINOPHEN 325 MG PO TABS
650.0000 mg | ORAL_TABLET | Freq: Once | ORAL | Status: AC
  Administered 2019-08-26: 650 mg via ORAL
  Filled 2019-08-26: qty 2

## 2019-08-26 MED ORDER — DIPHENHYDRAMINE HCL 25 MG PO CAPS
ORAL_CAPSULE | ORAL | Status: AC
  Filled 2019-08-26: qty 1

## 2019-08-26 MED ORDER — DEXTROSE 5 % IV SOLN
Freq: Once | INTRAVENOUS | Status: AC
  Administered 2019-08-26: 09:00:00 via INTRAVENOUS
  Filled 2019-08-26: qty 250

## 2019-08-26 NOTE — Patient Instructions (Signed)
Immune Globulin Injection What is this medicine? IMMUNE GLOBULIN (im MUNE GLOB yoo lin) helps to prevent or reduce the severity of certain infections in patients who are at risk. This medicine is collected from the pooled blood of many donors. It is used to treat immune system problems, thrombocytopenia, and Kawasaki syndrome. This medicine may be used for other purposes; ask your health care provider or pharmacist if you have questions. COMMON BRAND NAME(S): ASCENIV, Baygam, BIVIGAM, Carimune, Carimune NF, cutaquig, Cuvitru, Flebogamma, Flebogamma DIF, GamaSTAN, GamaSTAN S/D, Gamimune N, Gammagard, Gammagard S/D, Gammaked, Gammaplex, Gammar-P IV, Gamunex, Gamunex-C, Hizentra, Iveegam, Iveegam EN, Octagam, Panglobulin, Panglobulin NF, panzyga, Polygam S/D, Privigen, Sandoglobulin, Venoglobulin-S, Vigam, Vivaglobulin, Xembify What should I tell my health care provider before I take this medicine? They need to know if you have any of these conditions:   diabetes   extremely low or no immune antibodies in the blood   heart disease   history of blood clots   hyperprolinemia   infection in the blood, sepsis   kidney disease   taking medicine that may change kidney function - ask your health care provider about your medicine   an unusual or allergic reaction to human immune globulin, albumin, maltose, sucrose, polysorbate 80, other medicines, foods, dyes, or preservatives   pregnant or trying to get pregnant   breast-feeding How should I use this medicine? This medicine is for injection into a muscle or infusion into a vein or skin. It is usually given by a health care professional in a hospital or clinic setting. In rare cases, some brands of this medicine might be given at home. You will be taught how to give this medicine. Use exactly as directed. Take your medicine at regular intervals. Do not take your medicine more often than directed. Talk to your pediatrician regarding  the use of this medicine in children. Special care may be needed. Overdosage: If you think you have taken too much of this medicine contact a poison control center or emergency room at once. NOTE: This medicine is only for you. Do not share this medicine with others. What if I miss a dose? It is important not to miss your dose. Call your doctor or health care professional if you are unable to keep an appointment. If you give yourself the medicine and you miss a dose, take it as soon as you can. If it is almost time for your next dose, take only that dose. Do not take double or extra doses. What may interact with this medicine?  aspirin and aspirin-like medicines  cisplatin  cyclosporine  medicines for infection like acyclovir, adefovir, amphotericin B, bacitracin, cidofovir, foscarnet, ganciclovir, gentamicin, pentamidine, vancomycin  NSAIDS, medicines for pain and inflammation, like ibuprofen or naproxen  pamidronate  vaccines  zoledronic acid This list may not describe all possible interactions. Give your health care provider a list of all the medicines, herbs, non-prescription drugs, or dietary supplements you use. Also tell them if you smoke, drink alcohol, or use illegal drugs. Some items may interact with your medicine. What should I watch for while using this medicine? Your condition will be monitored carefully while you are receiving this medicine. This medicine is made from pooled blood donations of many different people. It may be possible to pass an infection in this medicine. However, the donors are screened for infections and all products are tested for HIV and hepatitis. The medicine is treated to kill most or all bacteria and viruses. Talk to your doctor about   the risks and benefits of this medicine. Do not have vaccinations for at least 14 days before, or until at least 3 months after receiving this medicine. What side effects may I notice from receiving this  medicine? Side effects that you should report to your doctor or health care professional as soon as possible:  allergic reactions like skin rash, itching or hives, swelling of the face, lips, or tongue  breathing problems  chest pain or tightness  fever, chills  headache with nausea, vomiting  neck pain or difficulty moving neck  pain when moving eyes  pain, swelling, warmth in the leg  problems with balance, talking, walking  sudden weight gain  swelling of the ankles, feet, hands  trouble passing urine or change in the amount of urine Side effects that usually do not require medical attention (report to your doctor or health care professional if they continue or are bothersome):  dizzy, drowsy  flushing  increased sweating  leg cramps  muscle aches and pains  pain at site where injected This list may not describe all possible side effects. Call your doctor for medical advice about side effects. You may report side effects to FDA at 1-800-FDA-1088. Where should I keep my medicine? Keep out of the reach of children. This drug is usually given in a hospital or clinic and will not be stored at home. In rare cases, some brands of this medicine may be given at home. If you are using this medicine at home, you will be instructed on how to store this medicine. Throw away any unused medicine after the expiration date on the label. NOTE: This sheet is a summary. It may not cover all possible information. If you have questions about this medicine, talk to your doctor, pharmacist, or health care provider.  2020 Elsevier/Gold Standard (2009-03-03 11:44:49)  

## 2019-08-27 ENCOUNTER — Inpatient Hospital Stay: Payer: Medicare Other

## 2019-08-27 VITALS — BP 147/73 | HR 84 | Temp 98.1°F | Resp 20

## 2019-08-27 DIAGNOSIS — M3320 Polymyositis, organ involvement unspecified: Secondary | ICD-10-CM

## 2019-08-27 LAB — ALDOLASE: Aldolase: 37.2 U/L — ABNORMAL HIGH (ref 3.3–10.3)

## 2019-08-27 MED ORDER — DEXTROSE 5 % IV SOLN
Freq: Once | INTRAVENOUS | Status: AC
  Administered 2019-08-27: 09:00:00 via INTRAVENOUS
  Filled 2019-08-27: qty 250

## 2019-08-27 MED ORDER — DIPHENHYDRAMINE HCL 25 MG PO TABS
25.0000 mg | ORAL_TABLET | Freq: Once | ORAL | Status: AC
  Administered 2019-08-27: 09:00:00 25 mg via ORAL
  Filled 2019-08-27: qty 1

## 2019-08-27 MED ORDER — METHYLPREDNISOLONE SODIUM SUCC 125 MG IJ SOLR
40.0000 mg | INTRAMUSCULAR | Status: DC
  Administered 2019-08-27: 40 mg via INTRAVENOUS
  Filled 2019-08-27: qty 2

## 2019-08-27 MED ORDER — DIPHENHYDRAMINE HCL 25 MG PO CAPS
ORAL_CAPSULE | ORAL | Status: AC
  Filled 2019-08-27: qty 1

## 2019-08-27 MED ORDER — IMMUNE GLOBULIN (HUMAN) 20 GM/200ML IV SOLN
500.0000 mg/kg | INTRAVENOUS | Status: DC
  Administered 2019-08-27: 09:00:00 40 g via INTRAVENOUS
  Filled 2019-08-27: qty 400

## 2019-08-27 MED ORDER — ACETAMINOPHEN 325 MG PO TABS
650.0000 mg | ORAL_TABLET | Freq: Once | ORAL | Status: AC
  Administered 2019-08-27: 650 mg via ORAL
  Filled 2019-08-27: qty 2

## 2019-08-28 ENCOUNTER — Other Ambulatory Visit: Payer: Self-pay

## 2019-08-28 ENCOUNTER — Inpatient Hospital Stay: Payer: Medicare Other

## 2019-08-28 VITALS — BP 156/83 | HR 79 | Temp 97.6°F | Resp 18

## 2019-08-28 DIAGNOSIS — M3320 Polymyositis, organ involvement unspecified: Secondary | ICD-10-CM | POA: Diagnosis not present

## 2019-08-28 MED ORDER — DEXTROSE 5 % IV SOLN
Freq: Once | INTRAVENOUS | Status: AC
  Administered 2019-08-28: 09:00:00 via INTRAVENOUS
  Filled 2019-08-28: qty 250

## 2019-08-28 MED ORDER — METHYLPREDNISOLONE SODIUM SUCC 125 MG IJ SOLR
40.0000 mg | INTRAMUSCULAR | Status: DC
  Administered 2019-08-28: 40 mg via INTRAVENOUS

## 2019-08-28 MED ORDER — DIPHENHYDRAMINE HCL 25 MG PO CAPS
ORAL_CAPSULE | ORAL | Status: AC
  Filled 2019-08-28: qty 1

## 2019-08-28 MED ORDER — IMMUNE GLOBULIN (HUMAN) 20 GM/200ML IV SOLN
500.0000 mg/kg | INTRAVENOUS | Status: DC
  Administered 2019-08-28: 10:00:00 40 g via INTRAVENOUS
  Filled 2019-08-28: qty 400

## 2019-08-28 MED ORDER — DIPHENHYDRAMINE HCL 25 MG PO TABS
25.0000 mg | ORAL_TABLET | Freq: Once | ORAL | Status: AC
  Administered 2019-08-28: 09:00:00 25 mg via ORAL
  Filled 2019-08-28: qty 1

## 2019-08-28 MED ORDER — ACETAMINOPHEN 325 MG PO TABS
650.0000 mg | ORAL_TABLET | Freq: Once | ORAL | Status: AC
  Administered 2019-08-28: 650 mg via ORAL
  Filled 2019-08-28: qty 2

## 2019-08-28 NOTE — Patient Instructions (Signed)
Immune Globulin Injection What is this medicine? IMMUNE GLOBULIN (im MUNE GLOB yoo lin) helps to prevent or reduce the severity of certain infections in patients who are at risk. This medicine is collected from the pooled blood of many donors. It is used to treat immune system problems, thrombocytopenia, and Kawasaki syndrome. This medicine may be used for other purposes; ask your health care provider or pharmacist if you have questions. COMMON BRAND NAME(S): ASCENIV, Baygam, BIVIGAM, Carimune, Carimune NF, cutaquig, Cuvitru, Flebogamma, Flebogamma DIF, GamaSTAN, GamaSTAN S/D, Gamimune N, Gammagard, Gammagard S/D, Gammaked, Gammaplex, Gammar-P IV, Gamunex, Gamunex-C, Hizentra, Iveegam, Iveegam EN, Octagam, Panglobulin, Panglobulin NF, panzyga, Polygam S/D, Privigen, Sandoglobulin, Venoglobulin-S, Vigam, Vivaglobulin, Xembify What should I tell my health care provider before I take this medicine? They need to know if you have any of these conditions:   diabetes   extremely low or no immune antibodies in the blood   heart disease   history of blood clots   hyperprolinemia   infection in the blood, sepsis   kidney disease   taking medicine that may change kidney function - ask your health care provider about your medicine   an unusual or allergic reaction to human immune globulin, albumin, maltose, sucrose, polysorbate 80, other medicines, foods, dyes, or preservatives   pregnant or trying to get pregnant   breast-feeding How should I use this medicine? This medicine is for injection into a muscle or infusion into a vein or skin. It is usually given by a health care professional in a hospital or clinic setting. In rare cases, some brands of this medicine might be given at home. You will be taught how to give this medicine. Use exactly as directed. Take your medicine at regular intervals. Do not take your medicine more often than directed. Talk to your pediatrician regarding  the use of this medicine in children. Special care may be needed. Overdosage: If you think you have taken too much of this medicine contact a poison control center or emergency room at once. NOTE: This medicine is only for you. Do not share this medicine with others. What if I miss a dose? It is important not to miss your dose. Call your doctor or health care professional if you are unable to keep an appointment. If you give yourself the medicine and you miss a dose, take it as soon as you can. If it is almost time for your next dose, take only that dose. Do not take double or extra doses. What may interact with this medicine?  aspirin and aspirin-like medicines  cisplatin  cyclosporine  medicines for infection like acyclovir, adefovir, amphotericin B, bacitracin, cidofovir, foscarnet, ganciclovir, gentamicin, pentamidine, vancomycin  NSAIDS, medicines for pain and inflammation, like ibuprofen or naproxen  pamidronate  vaccines  zoledronic acid This list may not describe all possible interactions. Give your health care provider a list of all the medicines, herbs, non-prescription drugs, or dietary supplements you use. Also tell them if you smoke, drink alcohol, or use illegal drugs. Some items may interact with your medicine. What should I watch for while using this medicine? Your condition will be monitored carefully while you are receiving this medicine. This medicine is made from pooled blood donations of many different people. It may be possible to pass an infection in this medicine. However, the donors are screened for infections and all products are tested for HIV and hepatitis. The medicine is treated to kill most or all bacteria and viruses. Talk to your doctor about   the risks and benefits of this medicine. Do not have vaccinations for at least 14 days before, or until at least 3 months after receiving this medicine. What side effects may I notice from receiving this  medicine? Side effects that you should report to your doctor or health care professional as soon as possible:  allergic reactions like skin rash, itching or hives, swelling of the face, lips, or tongue  breathing problems  chest pain or tightness  fever, chills  headache with nausea, vomiting  neck pain or difficulty moving neck  pain when moving eyes  pain, swelling, warmth in the leg  problems with balance, talking, walking  sudden weight gain  swelling of the ankles, feet, hands  trouble passing urine or change in the amount of urine Side effects that usually do not require medical attention (report to your doctor or health care professional if they continue or are bothersome):  dizzy, drowsy  flushing  increased sweating  leg cramps  muscle aches and pains  pain at site where injected This list may not describe all possible side effects. Call your doctor for medical advice about side effects. You may report side effects to FDA at 1-800-FDA-1088. Where should I keep my medicine? Keep out of the reach of children. This drug is usually given in a hospital or clinic and will not be stored at home. In rare cases, some brands of this medicine may be given at home. If you are using this medicine at home, you will be instructed on how to store this medicine. Throw away any unused medicine after the expiration date on the label. NOTE: This sheet is a summary. It may not cover all possible information. If you have questions about this medicine, talk to your doctor, pharmacist, or health care provider.  2020 Elsevier/Gold Standard (2009-03-03 11:44:49)  

## 2019-09-21 NOTE — Progress Notes (Signed)
[facility]  [address], [address] [place], [place] [address] Phone: [phone]  Fax: [phone]   Clinic Day:  09/22/2019  Referring physician: [name], MD  Chief Complaint: Bridget Romero is a 73 y.o. female with polymyositis who is seen for 1 month assessment and continuation of IVIG.  HPI: The patient was last seen in the hematology clinic on 08/25/2019. At that time, she was doing slightly better. The weakness in her legs had slightly improved. Hematocrit 32.9, hemoglobin 10.4, MCV 92.9, platelets 284,000, WBC 12,100. AST was 102 and ALT 196.  CK was 2,514. Aldolase was 37.2. She received IVIG 2 gm/kg (500 mg/kg perday x 4 days).   During the interim, her strength continues to improve. She denies infections and other symptoms. She has switched to an injection form of methotrexate from pills.  She is on prednisone 40 mg a day.  Labs today: hematocrit 32.1, hemoglobin 10.2, MCV 91.7, platelets 240,000, WBC 9500.  CK 807, improved.  Aldolase 12.5, improved.   Past Medical History:  Diagnosis Date   Allergy    Asthma    Diabetes mellitus    Hyperlipidemia    Hypertension    Neuromuscular disorder ([place])    polymyositis   Osteoarthritis    Vitamin B12 deficiency     Past Surgical History:  Procedure Laterality Date   COLONOSCOPY WITH PROPOFOL N/A 03/07/2016   Procedure: COLONOSCOPY WITH PROPOFOL;  Surgeon: Lollie Sails, MD;  Location: [facility] ENDOSCOPY;  Service: Endoscopy;  Laterality: N/A;   Family History  Problem Relation Age of Onset   Heart disease Mother    Cancer Father     Social History:  reports that she has quit smoking. She has never used smokeless tobacco. She reports that she does not drink alcohol or use drugs. She is a Training and development officer. She lives in [place]. The patient is alone today.  Allergies:  Allergies  Allergen Reactions   Atorvastatin Other (See Comments)    myalgias   Pravastatin Other (See  Comments)    Muscle pain    Current Medications: Current Outpatient Medications  Medication Sig Dispense Refill   ACCU-CHEK FASTCLIX LANCETS MISC Use to test blood sugar twice daily dx: 250.02 100 each 3   acetaminophen (TYLENOL) 500 MG tablet Take 500 mg by mouth every 6 (six) hours as needed.     albuterol (PROVENTIL HFA;VENTOLIN HFA) 108 (90 BASE) MCG/ACT inhaler Inhale 2 puffs into the lungs every 6 (six) hours as needed for wheezing or shortness of breath. 2 Inhaler 3   Alcohol Swabs (B-D SINGLE USE SWABS REGULAR) PADS Use to test blood sugar once daily dx: 250.00 100 each 3   alendronate (FOSAMAX) 70 MG tablet TAKE 1 TABLET EVERY 7 DAYS WITH A FULL GLASS OF WATER.(DO NOT LIE DOWN FOR THE NEXT 30 MINUTES ) 12 tablet 3   atorvastatin (LIPITOR) 10 MG tablet Take 1 tablet (10 mg total) by mouth daily. 90 tablet 3   Cholecalciferol (VITAMIN D) 1000 UNITS capsule Take 1,000 Units by mouth daily.       folic acid (FOLVITE) 1 MG tablet Take 1 mg by mouth daily.     gabapentin (NEURONTIN) 300 MG capsule Take 300 mg by mouth 2 (two) times daily.     glucose blood (ACCU-CHEK SMARTVIEW) test strip Use to check blood sugar twice a day Dx Code E11.4 100 each 6   insulin glargine (LANTUS) 100 UNIT/ML injection Inject 0.1-0.2 mLs (10-20 Units total) into the  skin daily. 10 mL 11   lisinopril (PRINIVIL,ZESTRIL) 10 MG tablet TAKE 1 TABLET BY MOUTH EVERY DAY 90 tablet 1   metFORMIN (GLUCOPHAGE) 1000 MG tablet TAKE 1 TABLET BY MOUTH TWICE DAILY 60 tablet 11   methotrexate 50 MG/2ML injection Inject into the skin once a week.      Multiple Vitamin (MULTIVITAMIN) tablet Take 1 tablet by mouth daily.       predniSONE (DELTASONE) 20 MG tablet Take 20 mg by mouth daily with breakfast.      fluticasone (FLOVENT HFA) 110 MCG/ACT inhaler Inhale 2 puffs into the lungs daily. (Patient not taking: Reported on 09/22/2019) 12 g 3   No current facility-administered medications for this visit.      Review of Systems  Constitutional: Negative.  Negative for chills, diaphoresis, fever, malaise/fatigue and weight loss (stable).       Feels "pretty good".  HENT: Negative.  Negative for congestion, ear discharge, ear pain, hearing loss, sinus pain and sore throat.   Eyes: Negative.  Negative for blurred vision, double vision, photophobia, pain and discharge.  Respiratory: Negative.  Negative for cough, hemoptysis, sputum production and shortness of breath.   Cardiovascular: Negative for chest pain, palpitations, orthopnea, claudication, leg swelling and PND.  Gastrointestinal: Negative.  Negative for abdominal pain, blood in stool, constipation, diarrhea, heartburn, melena, nausea and vomiting.  Genitourinary: Negative.  Negative for dysuria, flank pain, hematuria and urgency.  Musculoskeletal: Positive for myalgias (legs, secondary to polymyositis). Negative for back pain, falls, joint pain and neck pain.       Easier time getting up from a chair.  Legs feel "better" since last visit.  Skin: Negative.  Negative for itching and rash.  Neurological: Positive for weakness (in legs, improved). Negative for dizziness, tingling, tremors, sensory change, speech change, focal weakness and headaches.  Endo/Heme/Allergies: Negative.  Does not bruise/bleed easily.       Diabetes on insulin and Metformin.  Blood sugar elevated on prednisone.  Psychiatric/Behavioral: Negative.  Negative for depression and memory loss. The patient is not nervous/anxious and does not have insomnia.   All other systems reviewed and are negative.  Performance status (ECOG): 1  Vitals Blood pressure (!) 150/72, pulse 73, temperature 98.2 F (36.8 C), temperature source Oral, resp. rate 18, weight 184 lb 8.4 oz (83.7 kg), SpO2 100 %.   Physical Exam  Constitutional: She is oriented to person, place, and time. She appears well-developed and well-nourished. No distress. Face mask in place.  HENT:  Head: Normocephalic  and atraumatic.  Mouth/Throat: No oropharyngeal exudate.  Short curly dark hair.  Mask.  Eyes: Pupils are equal, round, and reactive to light. Conjunctivae and EOM are normal. No scleral icterus.  Brown eyes.   Neck: Neck supple. No JVD present.  Cardiovascular: Regular rhythm. Exam reveals no gallop and no friction rub.  No murmur heard. Pulmonary/Chest: Effort normal and breath sounds normal. No respiratory distress. She has no wheezes. She has no rales.  Abdominal: Soft. Bowel sounds are normal. She exhibits no distension and no mass. There is no abdominal tenderness. There is no rebound and no guarding.  Musculoskeletal:        General: No tenderness, deformity or edema.     Comments: Gets up from chair without using arms.  Lymphadenopathy:    She has no cervical adenopathy.    She has no axillary adenopathy.       Right: No supraclavicular adenopathy present.       Left: No supraclavicular  adenopathy present.  Neurological: She is alert and oriented to person, place, and time. She has normal strength. She displays no atrophy and no tremor. No cranial nerve deficit or sensory deficit. She exhibits normal muscle tone. Coordination normal.  Gait normal.  Able to left legs against resistance.  Skin: Skin is warm and dry. No rash noted. She is not diaphoretic. No erythema. No pallor.  Psychiatric: She has a normal mood and affect. Her behavior is normal. Judgment and thought content normal.  Nursing note and vitals reviewed.   Appointment on 09/22/2019  Component Date Value Ref Range Status   Sodium 09/22/2019 141  135 - 145 mmol/L Final   Potassium 09/22/2019 3.4* 3.5 - 5.1 mmol/L Final   Chloride 09/22/2019 105  98 - 111 mmol/L Final   CO2 09/22/2019 29  22 - 32 mmol/L Final   Glucose, Bld 09/22/2019 195* 70 - 99 mg/dL Final   BUN 09/22/2019 15  8 - 23 mg/dL Final   Creatinine, Ser 09/22/2019 0.55  0.44 - 1.00 mg/dL Final   Calcium 09/22/2019 8.9  8.9 - 10.3 mg/dL Final    Total Protein 09/22/2019 6.7  6.5 - 8.1 g/dL Final   Albumin 09/22/2019 3.5  3.5 - 5.0 g/dL Final   AST 09/22/2019 47* 15 - 41 U/L Final   ALT 09/22/2019 75* 0 - 44 U/L Final   Alkaline Phosphatase 09/22/2019 36* 38 - 126 U/L Final   Total Bilirubin 09/22/2019 0.5  0.3 - 1.2 mg/dL Final   GFR calc non Af Amer 09/22/2019 >60  >60 mL/min Final   GFR calc Af Amer 09/22/2019 >60  >60 mL/min Final   Anion gap 09/22/2019 7  5 - 15 Final   Performed at [facility], [address], [place], [place] [address]   WBC 09/22/2019 9.5  4.0 - 10.5 K/uL Final   RBC 09/22/2019 3.50* 3.87 - 5.11 MIL/uL Final   Hemoglobin 09/22/2019 10.2* 12.0 - 15.0 g/dL Final   HCT 09/22/2019 32.1* 36.0 - 46.0 % Final   MCV 09/22/2019 91.7  80.0 - 100.0 fL Final   MCH 09/22/2019 29.1  26.0 - 34.0 pg Final   MCHC 09/22/2019 31.8  30.0 - 36.0 g/dL Final   RDW 09/22/2019 14.4  11.5 - 15.5 % Final   Platelets 09/22/2019 240  150 - 400 K/uL Final   nRBC 09/22/2019 0.0  0.0 - 0.2 % Final   Neutrophils Relative % 09/22/2019 65  % Final   Neutro Abs 09/22/2019 6.3  1.7 - 7.7 K/uL Final   Lymphocytes Relative 09/22/2019 28  % Final   Lymphs Abs 09/22/2019 2.7  0.7 - 4.0 K/uL Final   Monocytes Relative 09/22/2019 5  % Final   Monocytes Absolute 09/22/2019 0.5  0.1 - 1.0 K/uL Final   Eosinophils Relative 09/22/2019 1  % Final   Eosinophils Absolute 09/22/2019 0.1  0.0 - 0.5 K/uL Final   Basophils Relative 09/22/2019 0  % Final   Basophils Absolute 09/22/2019 0.0  0.0 - 0.1 K/uL Final   Immature Granulocytes 09/22/2019 1  % Final   Abs Immature Granulocytes 09/22/2019 0.06  0.00 - 0.07 K/uL Final   Performed at [facility], [address] [place], [place] [address]   Total CK 09/22/2019 807* 38 - 234 U/L Final   Performed at [facility], [address] [place], [place] [address]    Assessment:  Bridget Romero is a 73 y.o. female with  polymyositis. She receives IVIG monthly,methotrexate, and prednisone10mg  a day.  CKhas been followed: >5555 on 05/30/2017, 4409 on 06/20/2017, 3546 on 07/05/2017, 2725 on 07/30/2017, 3675 on 08/28/2017, 2405 on 10/08/2017, 2743 on 11/08/2017, 2723 on 12/20/2017, 723 on 01/21/2018, 507 on 02/21/2018, 2192 on 04/11/2018, 2227 on 06/04/2018, 4468 on 07/22/2018, 2204 on 09/06/2018, 1360 on 12/02/2018, 1423 on 01/01/2019,912 on 03/03/2019, 3955 on 04/21/2019,4612 on 05/06/2019,4560 on 05/20/2019, 4532 on 06/16/2019, 4935 on 07/10/2019, 4330 on 07/28/2019, 3645 on 08/13/2019, 2514 on 08/25/2019, and 807 on 09/22/2019.  Aldolasewas 67.2 in06/05/2017, 69.9 on06/27/2018, 64.8 on07/11/2017,57.6 on08/05/2017, 60.3 on09/03/2017, 38.7 on 12/20/2017, 9.3 on01/28/2019, 7.4 on02/28/2019, 29.1 on04/18/2019, to51.9 on07/29/2019, 23.7 on09/13/2019, 16.4 on 12/02/2018,14.1 on 01/01/2019,8.8 on 03/03/2019, 53.4 on 05/06/2019,42.9 on 05/20/2019, 39.0 in 06/16/2019, 69.3 on 07/28/2019, 37.2 on 08/25/2019, and 12.5 on 09/22/2019.  She received IVIG (Privigen) 75 gm x 2 days every 4 weeks beginning 12/07/2017.IVIG was decreased to every 8 weeks on 04/22/2019, butthen returned to monthly secondary to rise CK.Last IVIG was 08/25/2019 (2 gm/kg).  She has a normocytic anemia. Baseline hemoglobin is 10.Last colonoscopy on 03/07/2016 revealed grade I hemorrhoids.She has B12 deficiencyand is on oral B12. B12 was 306 and folate >22.3 on 05/30/2017.   She has chronicmild elevation in LFTswhich have correlated with increased CPK.AST/ALThave been followed:53/54 on 02/17/2019, 107/128 on04/27/2020, 134/200 on05/26/2020, 138/261 on 07/10/2019, 151/304 on08/02/2019, 175/289 on 08/13/2019, 102/196 on 08/25/2019. Hepatitis B and C testing werenegative on 06/20/2017 and 07/28/2019.  Symptomatically, she has improved greatly since last visit.  She is able to get up from a chair.  Plan: 1.    Labs today:  CBC with diff, CMP, CK, aldolase. 2. Polymyositis Clinically, she has greatlt slightly since increasing IVIG to 2 gm/kg monthly. She is currently on prednisone 40 mg/day and MTX 25 mg injections weekly. CK 4330 on 07/28/2019 and 807 on 09/22/2019.             Aldolase 69.3 on 07/28/2019 and 12.5 on 09/22/2019. 3.Elevated LFTs AST 151 and ALT 304 on 07/28/2019. AST 47 and ALT 75 on 07/28/2019.             Increasedliver function tests correlates with increased CPK. CPK reflects activityofpolymyositis. Continue to monitor closely. 4.   IVIG2 gm/kg (500 mg/kg per dosedaily) x4. 5.   RTC on 1 month MD assessment, labs (CBC with diff, CMP, CK, aldolase) and IVIG over 4 days.  I discussed the assessment and treatment plan with the patient.  The patient was provided an opportunity to ask questions and all were answered.  The patient agreed with the plan and demonstrated an understanding of the instructions.  The patient was advised to call back if the symptoms worsen or if the condition fails to improve as anticipated.  I provided 24 minutes (8:47 AM - 9:10 AM) of face-to-face time during this this encounter and > 50% was spent counseling as documented under my assessment and plan.    Lequita Asal, MD, PhD    09/22/2019, 9:10 AM  I, Jacqualyn Posey, am acting as Education administrator for [employer]. Mike Gip, MD, PhD.  I, Bohdi Leeds C. Mike Gip, MD, have reviewed the above documentation for accuracy and completeness, and I agree with the above.

## 2019-09-22 ENCOUNTER — Inpatient Hospital Stay (HOSPITAL_BASED_OUTPATIENT_CLINIC_OR_DEPARTMENT_OTHER): Payer: Medicare Other | Admitting: Hematology and Oncology

## 2019-09-22 ENCOUNTER — Other Ambulatory Visit: Payer: Self-pay

## 2019-09-22 ENCOUNTER — Inpatient Hospital Stay: Payer: Medicare Other

## 2019-09-22 ENCOUNTER — Encounter: Payer: Self-pay | Admitting: Hematology and Oncology

## 2019-09-22 VITALS — BP 161/81 | HR 69 | Temp 96.9°F | Resp 18

## 2019-09-22 VITALS — BP 150/72 | HR 73 | Temp 98.2°F | Resp 18 | Wt 184.5 lb

## 2019-09-22 DIAGNOSIS — M3320 Polymyositis, organ involvement unspecified: Secondary | ICD-10-CM | POA: Diagnosis not present

## 2019-09-22 DIAGNOSIS — R7989 Other specified abnormal findings of blood chemistry: Secondary | ICD-10-CM

## 2019-09-22 LAB — CBC WITH DIFFERENTIAL/PLATELET
Abs Immature Granulocytes: 0.06 10*3/uL (ref 0.00–0.07)
Basophils Absolute: 0 10*3/uL (ref 0.0–0.1)
Basophils Relative: 0 %
Eosinophils Absolute: 0.1 10*3/uL (ref 0.0–0.5)
Eosinophils Relative: 1 %
HCT: 32.1 % — ABNORMAL LOW (ref 36.0–46.0)
Hemoglobin: 10.2 g/dL — ABNORMAL LOW (ref 12.0–15.0)
Immature Granulocytes: 1 %
Lymphocytes Relative: 28 %
Lymphs Abs: 2.7 10*3/uL (ref 0.7–4.0)
MCH: 29.1 pg (ref 26.0–34.0)
MCHC: 31.8 g/dL (ref 30.0–36.0)
MCV: 91.7 fL (ref 80.0–100.0)
Monocytes Absolute: 0.5 10*3/uL (ref 0.1–1.0)
Monocytes Relative: 5 %
Neutro Abs: 6.3 10*3/uL (ref 1.7–7.7)
Neutrophils Relative %: 65 %
Platelets: 240 10*3/uL (ref 150–400)
RBC: 3.5 MIL/uL — ABNORMAL LOW (ref 3.87–5.11)
RDW: 14.4 % (ref 11.5–15.5)
WBC: 9.5 10*3/uL (ref 4.0–10.5)
nRBC: 0 % (ref 0.0–0.2)

## 2019-09-22 LAB — COMPREHENSIVE METABOLIC PANEL
ALT: 75 U/L — ABNORMAL HIGH (ref 0–44)
AST: 47 U/L — ABNORMAL HIGH (ref 15–41)
Albumin: 3.5 g/dL (ref 3.5–5.0)
Alkaline Phosphatase: 36 U/L — ABNORMAL LOW (ref 38–126)
Anion gap: 7 (ref 5–15)
BUN: 15 mg/dL (ref 8–23)
CO2: 29 mmol/L (ref 22–32)
Calcium: 8.9 mg/dL (ref 8.9–10.3)
Chloride: 105 mmol/L (ref 98–111)
Creatinine, Ser: 0.55 mg/dL (ref 0.44–1.00)
GFR calc Af Amer: >60 mL/min (ref >60)
GFR calc non Af Amer: >60 mL/min (ref >60)
Glucose, Bld: 195 mg/dL — ABNORMAL HIGH (ref 70–99)
Potassium: 3.4 mmol/L — ABNORMAL LOW (ref 3.5–5.1)
Sodium: 141 mmol/L (ref 135–145)
Total Bilirubin: 0.5 mg/dL (ref 0.3–1.2)
Total Protein: 6.7 g/dL (ref 6.5–8.1)

## 2019-09-22 LAB — CK: Total CK: 807 U/L — ABNORMAL HIGH (ref 38–234)

## 2019-09-22 MED ORDER — IMMUNE GLOBULIN (HUMAN) 20 GM/200ML IV SOLN
500.0000 mg/kg | INTRAVENOUS | Status: DC
  Administered 2019-09-22: 10:00:00 40 g via INTRAVENOUS
  Filled 2019-09-22: qty 400

## 2019-09-22 MED ORDER — METHYLPREDNISOLONE SODIUM SUCC 125 MG IJ SOLR
INTRAMUSCULAR | Status: AC
  Filled 2019-09-22: qty 2

## 2019-09-22 MED ORDER — DIPHENHYDRAMINE HCL 25 MG PO TABS
25.0000 mg | ORAL_TABLET | Freq: Once | ORAL | Status: AC
  Administered 2019-09-22: 25 mg via ORAL
  Filled 2019-09-22: qty 1

## 2019-09-22 MED ORDER — METHYLPREDNISOLONE SODIUM SUCC 125 MG IJ SOLR
40.0000 mg | INTRAMUSCULAR | Status: DC
  Administered 2019-09-22: 40 mg via INTRAVENOUS

## 2019-09-22 MED ORDER — ACETAMINOPHEN 325 MG PO TABS
650.0000 mg | ORAL_TABLET | Freq: Once | ORAL | Status: AC
  Administered 2019-09-22: 650 mg via ORAL

## 2019-09-22 MED ORDER — DIPHENHYDRAMINE HCL 25 MG PO CAPS
ORAL_CAPSULE | ORAL | Status: AC
  Filled 2019-09-22: qty 1

## 2019-09-22 MED ORDER — ACETAMINOPHEN 325 MG PO TABS
ORAL_TABLET | ORAL | Status: AC
  Filled 2019-09-22: qty 2

## 2019-09-22 MED ORDER — DEXTROSE 5 % IV SOLN
Freq: Once | INTRAVENOUS | Status: AC
  Administered 2019-09-22: 09:00:00 via INTRAVENOUS
  Filled 2019-09-22: qty 250

## 2019-09-22 NOTE — Progress Notes (Signed)
Tolerated day 1 of IVIG well today. No complaints of any discomfort at time of discharge. Better strength and ROM of LE's noted today.

## 2019-09-22 NOTE — Progress Notes (Signed)
Patient here for follow up. Denies any concerns.  

## 2019-09-22 NOTE — Patient Instructions (Signed)
Immune Globulin Injection What is this medicine? IMMUNE GLOBULIN (im MUNE GLOB yoo lin) helps to prevent or reduce the severity of certain infections in patients who are at risk. This medicine is collected from the pooled blood of many donors. It is used to treat immune system problems, thrombocytopenia, and Kawasaki syndrome. This medicine may be used for other purposes; ask your health care provider or pharmacist if you have questions. COMMON BRAND NAME(S): ASCENIV, Baygam, BIVIGAM, Carimune, Carimune NF, cutaquig, Cuvitru, Flebogamma, Flebogamma DIF, GamaSTAN, GamaSTAN S/D, Gamimune N, Gammagard, Gammagard S/D, Gammaked, Gammaplex, Gammar-P IV, Gamunex, Gamunex-C, Hizentra, Iveegam, Iveegam EN, Octagam, Panglobulin, Panglobulin NF, panzyga, Polygam S/D, Privigen, Sandoglobulin, Venoglobulin-S, Vigam, Vivaglobulin, Xembify What should I tell my health care provider before I take this medicine? They need to know if you have any of these conditions:   diabetes   extremely low or no immune antibodies in the blood   heart disease   history of blood clots   hyperprolinemia   infection in the blood, sepsis   kidney disease   taking medicine that may change kidney function - ask your health care provider about your medicine   an unusual or allergic reaction to human immune globulin, albumin, maltose, sucrose, polysorbate 80, other medicines, foods, dyes, or preservatives   pregnant or trying to get pregnant   breast-feeding How should I use this medicine? This medicine is for injection into a muscle or infusion into a vein or skin. It is usually given by a health care professional in a hospital or clinic setting. In rare cases, some brands of this medicine might be given at home. You will be taught how to give this medicine. Use exactly as directed. Take your medicine at regular intervals. Do not take your medicine more often than directed. Talk to your pediatrician regarding  the use of this medicine in children. Special care may be needed. Overdosage: If you think you have taken too much of this medicine contact a poison control center or emergency room at once. NOTE: This medicine is only for you. Do not share this medicine with others. What if I miss a dose? It is important not to miss your dose. Call your doctor or health care professional if you are unable to keep an appointment. If you give yourself the medicine and you miss a dose, take it as soon as you can. If it is almost time for your next dose, take only that dose. Do not take double or extra doses. What may interact with this medicine?  aspirin and aspirin-like medicines  cisplatin  cyclosporine  medicines for infection like acyclovir, adefovir, amphotericin B, bacitracin, cidofovir, foscarnet, ganciclovir, gentamicin, pentamidine, vancomycin  NSAIDS, medicines for pain and inflammation, like ibuprofen or naproxen  pamidronate  vaccines  zoledronic acid This list may not describe all possible interactions. Give your health care provider a list of all the medicines, herbs, non-prescription drugs, or dietary supplements you use. Also tell them if you smoke, drink alcohol, or use illegal drugs. Some items may interact with your medicine. What should I watch for while using this medicine? Your condition will be monitored carefully while you are receiving this medicine. This medicine is made from pooled blood donations of many different people. It may be possible to pass an infection in this medicine. However, the donors are screened for infections and all products are tested for HIV and hepatitis. The medicine is treated to kill most or all bacteria and viruses. Talk to your doctor about   the risks and benefits of this medicine. Do not have vaccinations for at least 14 days before, or until at least 3 months after receiving this medicine. What side effects may I notice from receiving this  medicine? Side effects that you should report to your doctor or health care professional as soon as possible:  allergic reactions like skin rash, itching or hives, swelling of the face, lips, or tongue  breathing problems  chest pain or tightness  fever, chills  headache with nausea, vomiting  neck pain or difficulty moving neck  pain when moving eyes  pain, swelling, warmth in the leg  problems with balance, talking, walking  sudden weight gain  swelling of the ankles, feet, hands  trouble passing urine or change in the amount of urine Side effects that usually do not require medical attention (report to your doctor or health care professional if they continue or are bothersome):  dizzy, drowsy  flushing  increased sweating  leg cramps  muscle aches and pains  pain at site where injected This list may not describe all possible side effects. Call your doctor for medical advice about side effects. You may report side effects to FDA at 1-800-FDA-1088. Where should I keep my medicine? Keep out of the reach of children. This drug is usually given in a hospital or clinic and will not be stored at home. In rare cases, some brands of this medicine may be given at home. If you are using this medicine at home, you will be instructed on how to store this medicine. Throw away any unused medicine after the expiration date on the label. NOTE: This sheet is a summary. It may not cover all possible information. If you have questions about this medicine, talk to your doctor, pharmacist, or health care provider.  2020 Elsevier/Gold Standard (2009-03-03 11:44:49)  

## 2019-09-23 ENCOUNTER — Inpatient Hospital Stay: Payer: Medicare Other

## 2019-09-23 VITALS — BP 155/75 | HR 71 | Temp 96.3°F | Resp 16

## 2019-09-23 DIAGNOSIS — M3320 Polymyositis, organ involvement unspecified: Secondary | ICD-10-CM | POA: Diagnosis not present

## 2019-09-23 LAB — ALDOLASE: Aldolase: 12.5 U/L — ABNORMAL HIGH (ref 3.3–10.3)

## 2019-09-23 MED ORDER — DIPHENHYDRAMINE HCL 25 MG PO CAPS
ORAL_CAPSULE | ORAL | Status: AC
  Filled 2019-09-23: qty 1

## 2019-09-23 MED ORDER — DEXTROSE 5 % IV SOLN
Freq: Once | INTRAVENOUS | Status: AC
  Administered 2019-09-23: 09:00:00 via INTRAVENOUS
  Filled 2019-09-23: qty 250

## 2019-09-23 MED ORDER — DIPHENHYDRAMINE HCL 25 MG PO TABS
25.0000 mg | ORAL_TABLET | Freq: Once | ORAL | Status: AC
  Administered 2019-09-23: 09:00:00 25 mg via ORAL
  Filled 2019-09-23: qty 1

## 2019-09-23 MED ORDER — ACETAMINOPHEN 325 MG PO TABS
650.0000 mg | ORAL_TABLET | Freq: Once | ORAL | Status: AC
  Administered 2019-09-23: 09:00:00 650 mg via ORAL

## 2019-09-23 MED ORDER — METHYLPREDNISOLONE SODIUM SUCC 125 MG IJ SOLR
40.0000 mg | INTRAMUSCULAR | Status: DC
  Administered 2019-09-23: 40 mg via INTRAVENOUS
  Filled 2019-09-23: qty 2

## 2019-09-23 MED ORDER — IMMUNE GLOBULIN (HUMAN) 20 GM/200ML IV SOLN
500.0000 mg/kg | INTRAVENOUS | Status: DC
  Administered 2019-09-23: 40 g via INTRAVENOUS
  Filled 2019-09-23: qty 400

## 2019-09-24 ENCOUNTER — Other Ambulatory Visit: Payer: Self-pay

## 2019-09-24 ENCOUNTER — Inpatient Hospital Stay: Payer: Medicare Other

## 2019-09-24 VITALS — BP 167/80 | HR 76 | Temp 97.0°F | Resp 16

## 2019-09-24 DIAGNOSIS — M3320 Polymyositis, organ involvement unspecified: Secondary | ICD-10-CM | POA: Diagnosis not present

## 2019-09-24 MED ORDER — ACETAMINOPHEN 325 MG PO TABS
650.0000 mg | ORAL_TABLET | Freq: Once | ORAL | Status: AC
  Administered 2019-09-24: 09:00:00 650 mg via ORAL

## 2019-09-24 MED ORDER — DEXTROSE 5 % IV SOLN
Freq: Once | INTRAVENOUS | Status: AC
  Administered 2019-09-24: 09:00:00 via INTRAVENOUS
  Filled 2019-09-24: qty 250

## 2019-09-24 MED ORDER — IMMUNE GLOBULIN (HUMAN) 20 GM/200ML IV SOLN
40.0000 g | Freq: Once | INTRAVENOUS | Status: AC
  Administered 2019-09-24: 10:00:00 40 g via INTRAVENOUS
  Filled 2019-09-24: qty 400

## 2019-09-24 MED ORDER — METHYLPREDNISOLONE SODIUM SUCC 125 MG IJ SOLR
40.0000 mg | INTRAMUSCULAR | Status: DC
  Administered 2019-09-24: 40 mg via INTRAVENOUS

## 2019-09-24 MED ORDER — DIPHENHYDRAMINE HCL 25 MG PO TABS
25.0000 mg | ORAL_TABLET | Freq: Once | ORAL | Status: AC
  Administered 2019-09-24: 25 mg via ORAL
  Filled 2019-09-24: qty 1

## 2019-09-24 NOTE — Patient Instructions (Signed)
Immune Globulin Injection What is this medicine? IMMUNE GLOBULIN (im MUNE GLOB yoo lin) helps to prevent or reduce the severity of certain infections in patients who are at risk. This medicine is collected from the pooled blood of many donors. It is used to treat immune system problems, thrombocytopenia, and Kawasaki syndrome. This medicine may be used for other purposes; ask your health care provider or pharmacist if you have questions. COMMON BRAND NAME(S): ASCENIV, Baygam, BIVIGAM, Carimune, Carimune NF, cutaquig, Cuvitru, Flebogamma, Flebogamma DIF, GamaSTAN, GamaSTAN S/D, Gamimune N, Gammagard, Gammagard S/D, Gammaked, Gammaplex, Gammar-P IV, Gamunex, Gamunex-C, Hizentra, Iveegam, Iveegam EN, Octagam, Panglobulin, Panglobulin NF, panzyga, Polygam S/D, Privigen, Sandoglobulin, Venoglobulin-S, Vigam, Vivaglobulin, Xembify What should I tell my health care provider before I take this medicine? They need to know if you have any of these conditions:   diabetes   extremely low or no immune antibodies in the blood   heart disease   history of blood clots   hyperprolinemia   infection in the blood, sepsis   kidney disease   taking medicine that may change kidney function - ask your health care provider about your medicine   an unusual or allergic reaction to human immune globulin, albumin, maltose, sucrose, polysorbate 80, other medicines, foods, dyes, or preservatives   pregnant or trying to get pregnant   breast-feeding How should I use this medicine? This medicine is for injection into a muscle or infusion into a vein or skin. It is usually given by a health care professional in a hospital or clinic setting. In rare cases, some brands of this medicine might be given at home. You will be taught how to give this medicine. Use exactly as directed. Take your medicine at regular intervals. Do not take your medicine more often than directed. Talk to your pediatrician regarding  the use of this medicine in children. Special care may be needed. Overdosage: If you think you have taken too much of this medicine contact a poison control center or emergency room at once. NOTE: This medicine is only for you. Do not share this medicine with others. What if I miss a dose? It is important not to miss your dose. Call your doctor or health care professional if you are unable to keep an appointment. If you give yourself the medicine and you miss a dose, take it as soon as you can. If it is almost time for your next dose, take only that dose. Do not take double or extra doses. What may interact with this medicine?  aspirin and aspirin-like medicines  cisplatin  cyclosporine  medicines for infection like acyclovir, adefovir, amphotericin B, bacitracin, cidofovir, foscarnet, ganciclovir, gentamicin, pentamidine, vancomycin  NSAIDS, medicines for pain and inflammation, like ibuprofen or naproxen  pamidronate  vaccines  zoledronic acid This list may not describe all possible interactions. Give your health care provider a list of all the medicines, herbs, non-prescription drugs, or dietary supplements you use. Also tell them if you smoke, drink alcohol, or use illegal drugs. Some items may interact with your medicine. What should I watch for while using this medicine? Your condition will be monitored carefully while you are receiving this medicine. This medicine is made from pooled blood donations of many different people. It may be possible to pass an infection in this medicine. However, the donors are screened for infections and all products are tested for HIV and hepatitis. The medicine is treated to kill most or all bacteria and viruses. Talk to your doctor about   the risks and benefits of this medicine. Do not have vaccinations for at least 14 days before, or until at least 3 months after receiving this medicine. What side effects may I notice from receiving this  medicine? Side effects that you should report to your doctor or health care professional as soon as possible:  allergic reactions like skin rash, itching or hives, swelling of the face, lips, or tongue  breathing problems  chest pain or tightness  fever, chills  headache with nausea, vomiting  neck pain or difficulty moving neck  pain when moving eyes  pain, swelling, warmth in the leg  problems with balance, talking, walking  sudden weight gain  swelling of the ankles, feet, hands  trouble passing urine or change in the amount of urine Side effects that usually do not require medical attention (report to your doctor or health care professional if they continue or are bothersome):  dizzy, drowsy  flushing  increased sweating  leg cramps  muscle aches and pains  pain at site where injected This list may not describe all possible side effects. Call your doctor for medical advice about side effects. You may report side effects to FDA at 1-800-FDA-1088. Where should I keep my medicine? Keep out of the reach of children. This drug is usually given in a hospital or clinic and will not be stored at home. In rare cases, some brands of this medicine may be given at home. If you are using this medicine at home, you will be instructed on how to store this medicine. Throw away any unused medicine after the expiration date on the label. NOTE: This sheet is a summary. It may not cover all possible information. If you have questions about this medicine, talk to your doctor, pharmacist, or health care provider.  2020 Elsevier/Gold Standard (2009-03-03 11:44:49)  

## 2019-09-25 ENCOUNTER — Inpatient Hospital Stay: Payer: Medicare Other | Attending: Hematology and Oncology

## 2019-09-25 VITALS — BP 157/76 | HR 78 | Temp 96.5°F | Resp 16

## 2019-09-25 DIAGNOSIS — M3320 Polymyositis, organ involvement unspecified: Secondary | ICD-10-CM | POA: Diagnosis not present

## 2019-09-25 MED ORDER — ACETAMINOPHEN 325 MG PO TABS
650.0000 mg | ORAL_TABLET | Freq: Once | ORAL | Status: AC
  Administered 2019-09-25: 09:00:00 650 mg via ORAL

## 2019-09-25 MED ORDER — DIPHENHYDRAMINE HCL 25 MG PO TABS
25.0000 mg | ORAL_TABLET | Freq: Once | ORAL | Status: AC
  Administered 2019-09-25: 09:00:00 25 mg via ORAL
  Filled 2019-09-25: qty 1

## 2019-09-25 MED ORDER — METHYLPREDNISOLONE SODIUM SUCC 125 MG IJ SOLR
40.0000 mg | INTRAMUSCULAR | Status: DC
  Administered 2019-09-25: 09:00:00 40 mg via INTRAVENOUS

## 2019-09-25 MED ORDER — IMMUNE GLOBULIN (HUMAN) 20 GM/200ML IV SOLN
40.0000 g | Freq: Once | INTRAVENOUS | Status: AC
  Administered 2019-09-25: 09:00:00 40 g via INTRAVENOUS
  Filled 2019-09-25: qty 400

## 2019-09-25 MED ORDER — DEXTROSE 5 % IV SOLN
Freq: Once | INTRAVENOUS | Status: AC
  Administered 2019-09-25: 09:00:00 via INTRAVENOUS
  Filled 2019-09-25: qty 250

## 2019-09-25 NOTE — Patient Instructions (Signed)
Immune Globulin Injection What is this medicine? IMMUNE GLOBULIN (im MUNE GLOB yoo lin) helps to prevent or reduce the severity of certain infections in patients who are at risk. This medicine is collected from the pooled blood of many donors. It is used to treat immune system problems, thrombocytopenia, and Kawasaki syndrome. This medicine may be used for other purposes; ask your health care provider or pharmacist if you have questions. COMMON BRAND NAME(S): ASCENIV, Baygam, BIVIGAM, Carimune, Carimune NF, cutaquig, Cuvitru, Flebogamma, Flebogamma DIF, GamaSTAN, GamaSTAN S/D, Gamimune N, Gammagard, Gammagard S/D, Gammaked, Gammaplex, Gammar-P IV, Gamunex, Gamunex-C, Hizentra, Iveegam, Iveegam EN, Octagam, Panglobulin, Panglobulin NF, panzyga, Polygam S/D, Privigen, Sandoglobulin, Venoglobulin-S, Vigam, Vivaglobulin, Xembify What should I tell my health care provider before I take this medicine? They need to know if you have any of these conditions:   diabetes   extremely low or no immune antibodies in the blood   heart disease   history of blood clots   hyperprolinemia   infection in the blood, sepsis   kidney disease   taking medicine that may change kidney function - ask your health care provider about your medicine   an unusual or allergic reaction to human immune globulin, albumin, maltose, sucrose, polysorbate 80, other medicines, foods, dyes, or preservatives   pregnant or trying to get pregnant   breast-feeding How should I use this medicine? This medicine is for injection into a muscle or infusion into a vein or skin. It is usually given by a health care professional in a hospital or clinic setting. In rare cases, some brands of this medicine might be given at home. You will be taught how to give this medicine. Use exactly as directed. Take your medicine at regular intervals. Do not take your medicine more often than directed. Talk to your pediatrician regarding  the use of this medicine in children. Special care may be needed. Overdosage: If you think you have taken too much of this medicine contact a poison control center or emergency room at once. NOTE: This medicine is only for you. Do not share this medicine with others. What if I miss a dose? It is important not to miss your dose. Call your doctor or health care professional if you are unable to keep an appointment. If you give yourself the medicine and you miss a dose, take it as soon as you can. If it is almost time for your next dose, take only that dose. Do not take double or extra doses. What may interact with this medicine?  aspirin and aspirin-like medicines  cisplatin  cyclosporine  medicines for infection like acyclovir, adefovir, amphotericin B, bacitracin, cidofovir, foscarnet, ganciclovir, gentamicin, pentamidine, vancomycin  NSAIDS, medicines for pain and inflammation, like ibuprofen or naproxen  pamidronate  vaccines  zoledronic acid This list may not describe all possible interactions. Give your health care provider a list of all the medicines, herbs, non-prescription drugs, or dietary supplements you use. Also tell them if you smoke, drink alcohol, or use illegal drugs. Some items may interact with your medicine. What should I watch for while using this medicine? Your condition will be monitored carefully while you are receiving this medicine. This medicine is made from pooled blood donations of many different people. It may be possible to pass an infection in this medicine. However, the donors are screened for infections and all products are tested for HIV and hepatitis. The medicine is treated to kill most or all bacteria and viruses. Talk to your doctor about   the risks and benefits of this medicine. Do not have vaccinations for at least 14 days before, or until at least 3 months after receiving this medicine. What side effects may I notice from receiving this  medicine? Side effects that you should report to your doctor or health care professional as soon as possible:  allergic reactions like skin rash, itching or hives, swelling of the face, lips, or tongue  breathing problems  chest pain or tightness  fever, chills  headache with nausea, vomiting  neck pain or difficulty moving neck  pain when moving eyes  pain, swelling, warmth in the leg  problems with balance, talking, walking  sudden weight gain  swelling of the ankles, feet, hands  trouble passing urine or change in the amount of urine Side effects that usually do not require medical attention (report to your doctor or health care professional if they continue or are bothersome):  dizzy, drowsy  flushing  increased sweating  leg cramps  muscle aches and pains  pain at site where injected This list may not describe all possible side effects. Call your doctor for medical advice about side effects. You may report side effects to FDA at 1-800-FDA-1088. Where should I keep my medicine? Keep out of the reach of children. This drug is usually given in a hospital or clinic and will not be stored at home. In rare cases, some brands of this medicine may be given at home. If you are using this medicine at home, you will be instructed on how to store this medicine. Throw away any unused medicine after the expiration date on the label. NOTE: This sheet is a summary. It may not cover all possible information. If you have questions about this medicine, talk to your doctor, pharmacist, or health care provider.  2020 Elsevier/Gold Standard (2009-03-03 11:44:49)  

## 2019-10-17 NOTE — Progress Notes (Signed)
[facility]  [address], [address] [place],  [address] Phone: [phone]  Fax: [phone]   Clinic Day:  10/21/2019  Referring physician: [name], MD  Chief Complaint: Bridget Romero is a 73 y.o. female with polymyositis who is seen for 4 week assessment and continuation of IVIG.  HPI: The patient was last seen in the hematology clinic on 09/22/2019. At that time, she had improved greatly since last visit. She was able to get up from a chair. Hematocrit 32.1. Hemoglobin 10.2. AST 47. ALT 75. Alkaline phosphatase 36. Total CK was 807. Aldolase was 12.5.    She receive IVIG daily x 4 (09/23/2019 - 09/25/2019).   During the interim, she has felt "good". She feels stronger.  She denies any complaints today. She will see Dr. Jefm Romero on 10/27/2019.    Past Medical History:  Diagnosis Date  . Allergy   . Asthma   . Diabetes mellitus   . Hyperlipidemia   . Hypertension   . Neuromuscular disorder (HCC)    polymyositis  . Osteoarthritis   . Vitamin B12 deficiency     Past Surgical History:  Procedure Laterality Date  . COLONOSCOPY WITH PROPOFOL N/A 03/07/2016   Procedure: COLONOSCOPY WITH PROPOFOL;  Surgeon: Lollie Sails, MD;  Location: [facility] ENDOSCOPY;  Service: Endoscopy;  Laterality: N/A;    Family History  Problem Relation Age of Onset  . Heart disease Mother   . Cancer Father     Social History:  reports that she has quit smoking. She has never used smokeless tobacco. She reports that she does not drink alcohol or use drugs. 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 The patient is alone today.  Allergies:  Allergies  Allergen Reactions  . Atorvastatin Other (See Comments)    myalgias  . Pravastatin Other (See Comments)    Muscle pain    Current Medications: Current Outpatient Medications  Medication Sig Dispense Refill  . ACCU-CHEK FASTCLIX LANCETS MISC Use to test blood sugar twice daily dx: 250.02 100 each 3   . acetaminophen (TYLENOL) 500 MG tablet Take 500 mg by mouth every 6 (six) hours as needed.    . Alcohol Swabs (B-D SINGLE USE SWABS REGULAR) PADS Use to test blood sugar once daily dx: 250.00 100 each 3  . alendronate (FOSAMAX) 70 MG tablet TAKE 1 TABLET EVERY 7 DAYS WITH A FULL GLASS OF WATER.(DO NOT LIE DOWN FOR THE NEXT 30 MINUTES ) 12 tablet 3  . Cholecalciferol (VITAMIN D) 1000 UNITS capsule Take 1,000 Units by mouth daily.      . folic acid (FOLVITE) 1 MG tablet Take 1 mg by mouth daily.    Marland Kitchen gabapentin (NEURONTIN) 300 MG capsule Take 300 mg by mouth 2 (two) times daily.    Marland Kitchen glucose blood (ACCU-CHEK SMARTVIEW) test strip Use to check blood sugar twice a day Dx Code E11.4 100 each 6  . insulin glargine (LANTUS) 100 UNIT/ML injection Inject 0.1-0.2 mLs (10-20 Units total) into the skin daily. 10 mL 11  . lisinopril (PRINIVIL,ZESTRIL) 10 MG tablet TAKE 1 TABLET BY MOUTH EVERY DAY 90 tablet 1  . metFORMIN (GLUCOPHAGE) 1000 MG tablet TAKE 1 TABLET BY MOUTH TWICE DAILY 60 tablet 11  . methotrexate 50 MG/2ML injection Inject into the skin once a week.     . Multiple Vitamin (MULTIVITAMIN) tablet Take 1 tablet by mouth daily.      . predniSONE (DELTASONE) 20 MG tablet Take 30 mg by  mouth daily with breakfast.      No current facility-administered medications for this visit.     Review of Systems  Constitutional: Positive for weight loss (2 pounds). Negative for chills, diaphoresis, fever and malaise/fatigue.       Feels "good". Stronger.  HENT: Negative.  Negative for congestion, ear discharge, ear pain, hearing loss, nosebleeds, sinus pain and sore throat.   Eyes: Negative.  Negative for blurred vision, double vision, photophobia, pain and discharge.  Respiratory: Negative.  Negative for cough, hemoptysis, sputum production and shortness of breath.   Cardiovascular: Negative.  Negative for chest pain, palpitations, orthopnea, claudication, leg swelling and PND.  Gastrointestinal: Negative.   Negative for abdominal pain, blood in stool, constipation, diarrhea, heartburn, melena, nausea and vomiting.  Genitourinary: Negative.  Negative for dysuria, flank pain, hematuria and urgency.  Musculoskeletal: Negative for back pain, falls, joint pain, myalgias and neck pain.       Easier time getting up from a chair.   Skin: Negative.  Negative for itching and rash.  Neurological: Negative.  Negative for dizziness, tingling, tremors, sensory change, speech change, focal weakness, weakness and headaches.  Endo/Heme/Allergies: Negative.  Does not bruise/bleed easily.       Diabetes on insulin and Metformin.   Psychiatric/Behavioral: Negative.  Negative for depression and memory loss. The patient is not nervous/anxious and does not have insomnia.   All other systems reviewed and are negative.  Performance status (ECOG):  1  Vitals Blood pressure 137/77, pulse 80, temperature 98.1 F (36.7 C), temperature source Oral, resp. rate 18, weight 182 lb 8.7 oz (82.8 kg), SpO2 100 %.  Physical Exam  Constitutional: She is oriented to person, place, and time. She appears well-developed and well-nourished. No distress. Face mask in place.  HENT:  Head: Normocephalic and atraumatic.  Mouth/Throat: No oropharyngeal exudate.  Short curly dark hair.  Mask.  Eyes: Pupils are equal, round, and reactive to light. Conjunctivae and EOM are normal. No scleral icterus.  Brown eyes.   Neck: Normal range of motion. Neck supple. No JVD present.  Cardiovascular: Normal rate, regular rhythm and normal heart sounds. Exam reveals no gallop and no friction rub.  No murmur heard. Pulmonary/Chest: Effort normal and breath sounds normal. No respiratory distress. She has no wheezes. She has no rales.  Abdominal: Soft. Bowel sounds are normal. She exhibits no distension and no mass. There is no abdominal tenderness. There is no rebound and no guarding.  Musculoskeletal:        General: No tenderness, deformity or edema.      Comments: Gets up from chair without using arms.  Lymphadenopathy:    She has no cervical adenopathy.    She has no axillary adenopathy.       Right: No supraclavicular adenopathy present.       Left: No supraclavicular adenopathy present.  Neurological: She is alert and oriented to person, place, and time. She has normal strength. She displays no atrophy and no tremor. No cranial nerve deficit or sensory deficit. She exhibits normal muscle tone. Coordination normal.  Gait normal.  Skin: Skin is warm and dry. No rash noted. She is not diaphoretic. No erythema. No pallor.  Psychiatric: She has a normal mood and affect. Her behavior is normal. Judgment and thought content normal.  Nursing note and vitals reviewed.   Appointment on 10/21/2019  Component Date Value Ref Range Status  . Total CK 10/21/2019 251* 38 - 234 U/L Final   Performed at [facility], [address], [place], [place] [address]  . Sodium 10/21/2019 139  135 - 145 mmol/L Final  . Potassium 10/21/2019 3.5  3.5 - 5.1 mmol/L Final  . Chloride 10/21/2019 101  98 - 111 mmol/L Final  . CO2 10/21/2019 24  22 - 32 mmol/L Final  . Glucose, Bld 10/21/2019 146* 70 - 99 mg/dL Final  . BUN 10/21/2019 15  8 - 23 mg/dL Final  . Creatinine, Ser 10/21/2019 0.69  0.44 - 1.00 mg/dL Final  . Calcium 10/21/2019 9.9  8.9 - 10.3 mg/dL Final  . Total Protein 10/21/2019 7.0  6.5 - 8.1 g/dL Final  . Albumin 10/21/2019 3.6  3.5 - 5.0 g/dL Final  . AST 10/21/2019 31  15 - 41 U/L Final  . ALT 10/21/2019 32  0 - 44 U/L Final  . Alkaline Phosphatase 10/21/2019 40  38 - 126 U/L Final  . Total Bilirubin 10/21/2019 1.0  0.3 - 1.2 mg/dL Final  . GFR calc non Af Amer 10/21/2019 >60  >60 mL/min Final  . GFR calc Af Amer 10/21/2019 >60  >60 mL/min Final  . Anion gap 10/21/2019 14  5 - 15 Final   Performed at [facility], [address] [place], [place] [address]  . WBC 10/21/2019 9.4  4.0 - 10.5 K/uL Final  . RBC  10/21/2019 3.73* 3.87 - 5.11 MIL/uL Final  . Hemoglobin 10/21/2019 10.8* 12.0 - 15.0 g/dL Final  . HCT 10/21/2019 34.2* 36.0 - 46.0 % Final  . MCV 10/21/2019 91.7  80.0 - 100.0 fL Final  . MCH 10/21/2019 29.0  26.0 - 34.0 pg Final  . MCHC 10/21/2019 31.6  30.0 - 36.0 g/dL Final  . RDW 10/21/2019 14.6  11.5 - 15.5 % Final  . Platelets 10/21/2019 254  150 - 400 K/uL Final  . nRBC 10/21/2019 0.0  0.0 - 0.2 % Final  . Neutrophils Relative % 10/21/2019 74  % Final  . Neutro Abs 10/21/2019 6.9  1.7 - 7.7 K/uL Final  . Lymphocytes Relative 10/21/2019 21  % Final  . Lymphs Abs 10/21/2019 2.0  0.7 - 4.0 K/uL Final  . Monocytes Relative 10/21/2019 4  % Final  . Monocytes Absolute 10/21/2019 0.3  0.1 - 1.0 K/uL Final  . Eosinophils Relative 10/21/2019 1  % Final  . Eosinophils Absolute 10/21/2019 0.1  0.0 - 0.5 K/uL Final  . Basophils Relative 10/21/2019 0  % Final  . Basophils Absolute 10/21/2019 0.0  0.0 - 0.1 K/uL Final  . Immature Granulocytes 10/21/2019 0  % Final  . Abs Immature Granulocytes 10/21/2019 0.04  0.00 - 0.07 K/uL Final   Performed at [facility], [address], [place], [place] [address]    Assessment:  LATRISA ONSUREZ is a 73 y.o. 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 She receives IVIG monthly,methotrexate, and prednisone10mg  a day.  CKhas been followed: >5555 on 05/30/2017, 4409 on 06/20/2017, 3546 on 07/05/2017, 2725 on 07/30/2017, 3675 on 08/28/2017, 2405 on 10/08/2017, 2743 on 11/08/2017, 2723 on 12/20/2017, 723 on 01/21/2018, 507 on 02/21/2018, 2192 on 04/11/2018, 2227 on 06/04/2018, 4468 on 07/22/2018, 2204 on 09/06/2018, 1360 on 12/02/2018, 1423 on 01/01/2019,912 on 03/03/2019, 3955 on 04/21/2019,4612 on 05/06/2019,4560 on 05/20/2019, 4532 on 06/16/2019,4935 on 07/10/2019,4330 on 07/28/2019, 3645 on 08/13/2019, 2514 on 08/25/2019, 807 on 09/22/2019, and 251 on 10/21/2019.  Aldolasewas 67.2 in06/05/2017, 69.9 on06/27/2018, 64.8 on07/11/2017,57.6  on08/05/2017, 60.3 on09/03/2017, 38.7 on 12/20/2017, 9.3 on01/28/2019, 7.4 on02/28/2019, 29.1 on04/18/2019, to51.9 on07/29/2019, 23.7 on09/13/2019, 16.4 on  12/02/2018,14.1 on 01/01/2019,8.8 on 03/03/2019, 53.4 on 05/06/2019,42.9 on 05/20/2019, 39.0 in 06/16/2019, 69.3 on 07/28/2019, 37.2 on 08/25/2019, 12.5 on 09/22/2019, and 7.2 on 10/21/2019.  She received IVIG (Privigen) 75 gm x 2 days every 4 weeks beginning 12/07/2017.IVIG was decreased to every 8 weeks on 04/22/2019, butthen returned to monthly secondary to rise CK.Last IVIG was 08/31/2020and 09/23/2019 (2 gm/kg).  She has a normocytic anemia. Baseline hemoglobin is 10.Last colonoscopy on 03/07/2016 revealed grade I hemorrhoids.She has B12 deficiencyand is on oral B12. B12 was 306 and folate >22.3 on 05/30/2017.   She has chronicmild elevation in LFTswhich have correlated with increased CPK.AST/ALThave been followed:53/54 on 02/17/2019, 107/128 on04/27/2020, 134/200 on05/26/2020,138/261 on 07/10/2019,151/304 on08/02/2019, 175/289 on 08/13/2019, 102/196 on 08/25/2019, and 47/75 on 09/22/2019. Hepatitis B and C testing werenegative on 06/27/2018and 07/28/2019.  Symptomatically, she is doing well.  She is stronger.  Gait is normal.  She is able to rise from a chair without using her arms.  Plan: 1.   Labs today: CBC with diff, CMP, CK, aldolase. 2. Polymyositis Clinically, she has continued to improve since increasing IVIG to 2 gm/kg monthly. She is currently on prednisone 30 mg/day and MTX25 mgSQ weekly. CK 4330on 07/28/2019 , 807 on 09/22/2019, and 251 today. Aldolase 69.3 on 07/28/2019 , 12.5 on 09/22/2019, and 7.2 today.  Follow-up with Dr Bridget Romero on 10/27/2019.  Per Dr Bridget Romero, consideration being made for addition of Rituxan 3.Elevated LFTs AST 151 and ALT 304 on 07/28/2019. AST 47and ALT 75on 07/28/2019.  AST  31 and ALT 32 on 10/21/2019. Increasedliver function testscorrelates withincreased CPK. CPK reflects activityofpolymyositis. Continue to monitor monthly.. 4.   IVIG2 gm/kg (500 mg/kg per dosedaily x4). 5.   RTC in 1 month for MD assessment, labs (CBC with diff, CMP, CK, aldolase), and IVIG over 4 days.  I discussed the assessment and treatment plan with the patient.  The patient was provided an opportunity to ask questions and all were answered.  The patient agreed with the plan and demonstrated an understanding of the instructions.  The patient was advised to call back if the symptoms worsen or if the condition fails to improve as anticipated.   Lequita Asal, MD, PhD    10/21/2019, 9:28 AM  I, Selena Batten, am acting as scribe for [employer]. Mike Gip, MD, PhD.  I, Melissa C. Mike Gip, MD, have reviewed the above documentation for accuracy and completeness, and I agree with the above.

## 2019-10-20 ENCOUNTER — Other Ambulatory Visit: Payer: Medicare Other

## 2019-10-20 ENCOUNTER — Ambulatory Visit: Payer: Medicare Other | Admitting: Hematology and Oncology

## 2019-10-20 ENCOUNTER — Ambulatory Visit: Payer: Medicare Other

## 2019-10-21 ENCOUNTER — Inpatient Hospital Stay: Payer: Medicare Other

## 2019-10-21 ENCOUNTER — Inpatient Hospital Stay (HOSPITAL_BASED_OUTPATIENT_CLINIC_OR_DEPARTMENT_OTHER): Payer: Medicare Other | Admitting: Hematology and Oncology

## 2019-10-21 ENCOUNTER — Other Ambulatory Visit: Payer: Self-pay

## 2019-10-21 ENCOUNTER — Encounter: Payer: Self-pay | Admitting: Hematology and Oncology

## 2019-10-21 ENCOUNTER — Other Ambulatory Visit: Payer: Self-pay | Admitting: Hematology and Oncology

## 2019-10-21 VITALS — BP 149/80 | HR 75 | Temp 97.1°F | Resp 17

## 2019-10-21 VITALS — BP 137/77 | HR 80 | Temp 98.1°F | Resp 18 | Wt 182.5 lb

## 2019-10-21 DIAGNOSIS — M3320 Polymyositis, organ involvement unspecified: Secondary | ICD-10-CM

## 2019-10-21 DIAGNOSIS — R7989 Other specified abnormal findings of blood chemistry: Secondary | ICD-10-CM | POA: Diagnosis not present

## 2019-10-21 LAB — CBC WITH DIFFERENTIAL/PLATELET
Abs Immature Granulocytes: 0.04 10*3/uL (ref 0.00–0.07)
Basophils Absolute: 0 10*3/uL (ref 0.0–0.1)
Basophils Relative: 0 %
Eosinophils Absolute: 0.1 10*3/uL (ref 0.0–0.5)
Eosinophils Relative: 1 %
HCT: 34.2 % — ABNORMAL LOW (ref 36.0–46.0)
Hemoglobin: 10.8 g/dL — ABNORMAL LOW (ref 12.0–15.0)
Immature Granulocytes: 0 %
Lymphocytes Relative: 21 %
Lymphs Abs: 2 10*3/uL (ref 0.7–4.0)
MCH: 29 pg (ref 26.0–34.0)
MCHC: 31.6 g/dL (ref 30.0–36.0)
MCV: 91.7 fL (ref 80.0–100.0)
Monocytes Absolute: 0.3 10*3/uL (ref 0.1–1.0)
Monocytes Relative: 4 %
Neutro Abs: 6.9 10*3/uL (ref 1.7–7.7)
Neutrophils Relative %: 74 %
Platelets: 254 10*3/uL (ref 150–400)
RBC: 3.73 MIL/uL — ABNORMAL LOW (ref 3.87–5.11)
RDW: 14.6 % (ref 11.5–15.5)
WBC: 9.4 10*3/uL (ref 4.0–10.5)
nRBC: 0 % (ref 0.0–0.2)

## 2019-10-21 LAB — COMPREHENSIVE METABOLIC PANEL
ALT: 32 U/L (ref 0–44)
AST: 31 U/L (ref 15–41)
Albumin: 3.6 g/dL (ref 3.5–5.0)
Alkaline Phosphatase: 40 U/L (ref 38–126)
Anion gap: 14 (ref 5–15)
BUN: 15 mg/dL (ref 8–23)
CO2: 24 mmol/L (ref 22–32)
Calcium: 9.9 mg/dL (ref 8.9–10.3)
Chloride: 101 mmol/L (ref 98–111)
Creatinine, Ser: 0.69 mg/dL (ref 0.44–1.00)
GFR calc Af Amer: >60 mL/min (ref >60)
GFR calc non Af Amer: >60 mL/min (ref >60)
Glucose, Bld: 146 mg/dL — ABNORMAL HIGH (ref 70–99)
Potassium: 3.5 mmol/L (ref 3.5–5.1)
Sodium: 139 mmol/L (ref 135–145)
Total Bilirubin: 1 mg/dL (ref 0.3–1.2)
Total Protein: 7 g/dL (ref 6.5–8.1)

## 2019-10-21 LAB — CK: Total CK: 251 U/L — ABNORMAL HIGH (ref 38–234)

## 2019-10-21 MED ORDER — METHYLPREDNISOLONE SODIUM SUCC 125 MG IJ SOLR
INTRAMUSCULAR | Status: AC
  Filled 2019-10-21: qty 2

## 2019-10-21 MED ORDER — DEXTROSE 5 % IV SOLN
Freq: Once | INTRAVENOUS | Status: AC
  Administered 2019-10-21: 10:00:00 via INTRAVENOUS
  Filled 2019-10-21: qty 250

## 2019-10-21 MED ORDER — DIPHENHYDRAMINE HCL 25 MG PO TABS
25.0000 mg | ORAL_TABLET | Freq: Once | ORAL | Status: AC
  Administered 2019-10-21: 10:00:00 25 mg via ORAL
  Filled 2019-10-21: qty 1

## 2019-10-21 MED ORDER — IMMUNE GLOBULIN (HUMAN) 20 GM/200ML IV SOLN
500.0000 mg/kg | INTRAVENOUS | Status: DC
  Administered 2019-10-21: 40 g via INTRAVENOUS
  Filled 2019-10-21: qty 400

## 2019-10-21 MED ORDER — ACETAMINOPHEN 325 MG PO TABS
650.0000 mg | ORAL_TABLET | Freq: Once | ORAL | Status: AC
  Administered 2019-10-21: 10:00:00 650 mg via ORAL

## 2019-10-21 MED ORDER — DIPHENHYDRAMINE HCL 25 MG PO CAPS
ORAL_CAPSULE | ORAL | Status: AC
  Filled 2019-10-21: qty 1

## 2019-10-21 MED ORDER — METHYLPREDNISOLONE SODIUM SUCC 125 MG IJ SOLR
40.0000 mg | INTRAMUSCULAR | Status: DC
  Administered 2019-10-21: 40 mg via INTRAVENOUS

## 2019-10-21 MED ORDER — ACETAMINOPHEN 325 MG PO TABS
ORAL_TABLET | ORAL | Status: AC
  Filled 2019-10-21: qty 2

## 2019-10-21 NOTE — Patient Instructions (Signed)
Immune Globulin Injection What is this medicine? IMMUNE GLOBULIN (im MUNE GLOB yoo lin) helps to prevent or reduce the severity of certain infections in patients who are at risk. This medicine is collected from the pooled blood of many donors. It is used to treat immune system problems, thrombocytopenia, and Kawasaki syndrome. This medicine may be used for other purposes; ask your health care provider or pharmacist if you have questions. COMMON BRAND NAME(S): ASCENIV, Baygam, BIVIGAM, Carimune, Carimune NF, cutaquig, Cuvitru, Flebogamma, Flebogamma DIF, GamaSTAN, GamaSTAN S/D, Gamimune N, Gammagard, Gammagard S/D, Gammaked, Gammaplex, Gammar-P IV, Gamunex, Gamunex-C, Hizentra, Iveegam, Iveegam EN, Octagam, Panglobulin, Panglobulin NF, panzyga, Polygam S/D, Privigen, Sandoglobulin, Venoglobulin-S, Vigam, Vivaglobulin, Xembify What should I tell my health care provider before I take this medicine? They need to know if you have any of these conditions:   diabetes   extremely low or no immune antibodies in the blood   heart disease   history of blood clots   hyperprolinemia   infection in the blood, sepsis   kidney disease   taking medicine that may change kidney function - ask your health care provider about your medicine   an unusual or allergic reaction to human immune globulin, albumin, maltose, sucrose, polysorbate 80, other medicines, foods, dyes, or preservatives   pregnant or trying to get pregnant   breast-feeding How should I use this medicine? This medicine is for injection into a muscle or infusion into a vein or skin. It is usually given by a health care professional in a hospital or clinic setting. In rare cases, some brands of this medicine might be given at home. You will be taught how to give this medicine. Use exactly as directed. Take your medicine at regular intervals. Do not take your medicine more often than directed. Talk to your pediatrician regarding  the use of this medicine in children. Special care may be needed. Overdosage: If you think you have taken too much of this medicine contact a poison control center or emergency room at once. NOTE: This medicine is only for you. Do not share this medicine with others. What if I miss a dose? It is important not to miss your dose. Call your doctor or health care professional if you are unable to keep an appointment. If you give yourself the medicine and you miss a dose, take it as soon as you can. If it is almost time for your next dose, take only that dose. Do not take double or extra doses. What may interact with this medicine?  aspirin and aspirin-like medicines  cisplatin  cyclosporine  medicines for infection like acyclovir, adefovir, amphotericin B, bacitracin, cidofovir, foscarnet, ganciclovir, gentamicin, pentamidine, vancomycin  NSAIDS, medicines for pain and inflammation, like ibuprofen or naproxen  pamidronate  vaccines  zoledronic acid This list may not describe all possible interactions. Give your health care provider a list of all the medicines, herbs, non-prescription drugs, or dietary supplements you use. Also tell them if you smoke, drink alcohol, or use illegal drugs. Some items may interact with your medicine. What should I watch for while using this medicine? Your condition will be monitored carefully while you are receiving this medicine. This medicine is made from pooled blood donations of many different people. It may be possible to pass an infection in this medicine. However, the donors are screened for infections and all products are tested for HIV and hepatitis. The medicine is treated to kill most or all bacteria and viruses. Talk to your doctor about   the risks and benefits of this medicine. Do not have vaccinations for at least 14 days before, or until at least 3 months after receiving this medicine. What side effects may I notice from receiving this  medicine? Side effects that you should report to your doctor or health care professional as soon as possible:  allergic reactions like skin rash, itching or hives, swelling of the face, lips, or tongue  breathing problems  chest pain or tightness  fever, chills  headache with nausea, vomiting  neck pain or difficulty moving neck  pain when moving eyes  pain, swelling, warmth in the leg  problems with balance, talking, walking  sudden weight gain  swelling of the ankles, feet, hands  trouble passing urine or change in the amount of urine Side effects that usually do not require medical attention (report to your doctor or health care professional if they continue or are bothersome):  dizzy, drowsy  flushing  increased sweating  leg cramps  muscle aches and pains  pain at site where injected This list may not describe all possible side effects. Call your doctor for medical advice about side effects. You may report side effects to FDA at 1-800-FDA-1088. Where should I keep my medicine? Keep out of the reach of children. This drug is usually given in a hospital or clinic and will not be stored at home. In rare cases, some brands of this medicine may be given at home. If you are using this medicine at home, you will be instructed on how to store this medicine. Throw away any unused medicine after the expiration date on the label. NOTE: This sheet is a summary. It may not cover all possible information. If you have questions about this medicine, talk to your doctor, pharmacist, or health care provider.  2020 Elsevier/Gold Standard (2009-03-03 11:44:49)  

## 2019-10-21 NOTE — Progress Notes (Signed)
Patient here for follow up. Denies any concerns.  

## 2019-10-22 ENCOUNTER — Encounter: Payer: Self-pay | Admitting: Internal Medicine

## 2019-10-22 ENCOUNTER — Inpatient Hospital Stay: Payer: Medicare Other

## 2019-10-22 ENCOUNTER — Ambulatory Visit (INDEPENDENT_AMBULATORY_CARE_PROVIDER_SITE_OTHER): Payer: Medicare Other | Admitting: Internal Medicine

## 2019-10-22 VITALS — BP 130/88 | HR 79 | Temp 97.8°F | Ht 65.5 in | Wt 181.0 lb

## 2019-10-22 VITALS — BP 152/76 | HR 81 | Temp 98.3°F | Resp 18

## 2019-10-22 DIAGNOSIS — IMO0002 Reserved for concepts with insufficient information to code with codable children: Secondary | ICD-10-CM

## 2019-10-22 DIAGNOSIS — E1165 Type 2 diabetes mellitus with hyperglycemia: Secondary | ICD-10-CM

## 2019-10-22 DIAGNOSIS — Z0000 Encounter for general adult medical examination without abnormal findings: Secondary | ICD-10-CM

## 2019-10-22 DIAGNOSIS — Z7189 Other specified counseling: Secondary | ICD-10-CM | POA: Diagnosis not present

## 2019-10-22 DIAGNOSIS — M3320 Polymyositis, organ involvement unspecified: Secondary | ICD-10-CM

## 2019-10-22 DIAGNOSIS — E1140 Type 2 diabetes mellitus with diabetic neuropathy, unspecified: Secondary | ICD-10-CM | POA: Diagnosis not present

## 2019-10-22 DIAGNOSIS — I10 Essential (primary) hypertension: Secondary | ICD-10-CM

## 2019-10-22 LAB — POCT GLYCOSYLATED HEMOGLOBIN (HGB A1C): Hemoglobin A1C: 9.9 % — AB (ref 4.0–5.6)

## 2019-10-22 LAB — ALDOLASE: Aldolase: 7.2 U/L (ref 3.3–10.3)

## 2019-10-22 LAB — HM DIABETES FOOT EXAM

## 2019-10-22 MED ORDER — ALTEPLASE 2 MG IJ SOLR: 2.0000 mg | Freq: Once | INTRAMUSCULAR | Status: DC | PRN

## 2019-10-22 MED ORDER — SODIUM CHLORIDE 0.9% FLUSH
3.0000 mL | Freq: Once | INTRAVENOUS | Status: DC | PRN
  Filled 2019-10-22: qty 3

## 2019-10-22 MED ORDER — DIPHENHYDRAMINE HCL 25 MG PO CAPS
ORAL_CAPSULE | ORAL | Status: AC
  Filled 2019-10-22: qty 1

## 2019-10-22 MED ORDER — ACETAMINOPHEN 325 MG PO TABS
650.0000 mg | ORAL_TABLET | Freq: Once | ORAL | Status: AC
  Administered 2019-10-22: 650 mg via ORAL

## 2019-10-22 MED ORDER — SODIUM CHLORIDE 0.9% FLUSH
10.0000 mL | Freq: Once | INTRAVENOUS | Status: DC | PRN
  Filled 2019-10-22: qty 10

## 2019-10-22 MED ORDER — HEPARIN SOD (PORK) LOCK FLUSH 100 UNIT/ML IV SOLN: 250.0000 [IU] | Freq: Once | INTRAVENOUS | Status: DC | PRN

## 2019-10-22 MED ORDER — DIPHENHYDRAMINE HCL 25 MG PO TABS
25.0000 mg | ORAL_TABLET | Freq: Once | ORAL | Status: AC
  Administered 2019-10-22: 25 mg via ORAL
  Filled 2019-10-22: qty 1

## 2019-10-22 MED ORDER — GLIMEPIRIDE 2 MG PO TABS: 2.0000 mg | ORAL_TABLET | Freq: Every day | ORAL | 3 refills | Status: DC

## 2019-10-22 MED ORDER — INSULIN GLARGINE 100 UNIT/ML ~~LOC~~ SOLN: 20.0000 [IU] | Freq: Every day | SUBCUTANEOUS | 0 refills | Status: DC

## 2019-10-22 MED ORDER — IMMUNE GLOBULIN (HUMAN) 20 GM/200ML IV SOLN
500.0000 mg/kg | INTRAVENOUS | Status: DC
  Administered 2019-10-22: 40 g via INTRAVENOUS
  Filled 2019-10-22: qty 400

## 2019-10-22 MED ORDER — HEPARIN SOD (PORK) LOCK FLUSH 100 UNIT/ML IV SOLN: 500.0000 [IU] | Freq: Once | INTRAVENOUS | Status: DC | PRN

## 2019-10-22 MED ORDER — ACETAMINOPHEN 325 MG PO TABS
ORAL_TABLET | ORAL | Status: AC
  Filled 2019-10-22: qty 2

## 2019-10-22 MED ORDER — DEXTROSE 5 % IV SOLN
Freq: Once | INTRAVENOUS | Status: AC
  Administered 2019-10-22: 11:00:00 via INTRAVENOUS
  Filled 2019-10-22: qty 250

## 2019-10-22 MED ORDER — METHYLPREDNISOLONE SODIUM SUCC 125 MG IJ SOLR
INTRAMUSCULAR | Status: AC
  Filled 2019-10-22: qty 2

## 2019-10-22 MED ORDER — METHYLPREDNISOLONE SODIUM SUCC 125 MG IJ SOLR
40.0000 mg | INTRAMUSCULAR | Status: DC
  Administered 2019-10-22: 40 mg via INTRAVENOUS

## 2019-10-22 NOTE — Assessment & Plan Note (Signed)
See social history 

## 2019-10-22 NOTE — Assessment & Plan Note (Signed)
BP Readings from Last 3 Encounters:  10/22/19 130/88  10/21/19 (!) 149/80  10/21/19 137/77   Good control

## 2019-10-22 NOTE — Progress Notes (Signed)
Subjective:    Patient ID: Bridget Romero, female    DOB: 1946-11-13, 73 y.o.   MRN: [id number]  HPI Here for Medicare wellness visit and follow up of chronic health conditions Reviewed form and advanced directives Reviewed other doctors No alcohol or tobacco Tries to exercise regularly Vision okay---just had exam No sig hearing problems No falls No depression or anhedonia No sig memory issues  Polymyositis is under control with IVIG, methotrexate and prednisone Fair control Does her shopping and all instrumental ADLs Trouble with stairs---otherwise does fine  Checks sugars bid Uses 20 units insulin if over 150, otherwise takes 10 Also on metformin Gabapentin helps with the neuropathy  No chest pain or SOB No dizziness or syncope Mild edema at times--usually evening (better in AM) No palpitations  Still takes B12 supplements  Current Outpatient Medications on File Prior to Visit  Medication Sig Dispense Refill  . ACCU-CHEK FASTCLIX LANCETS MISC Use to test blood sugar twice daily dx: 250.02 100 each 3  . acetaminophen (TYLENOL) 500 MG tablet Take 500 mg by mouth every 6 (six) hours as needed.    . Alcohol Swabs (B-D SINGLE USE SWABS REGULAR) PADS Use to test blood sugar once daily dx: 250.00 100 each 3  . alendronate (FOSAMAX) 70 MG tablet TAKE 1 TABLET EVERY 7 DAYS WITH A FULL GLASS OF WATER.(DO NOT LIE DOWN FOR THE NEXT 30 MINUTES ) 12 tablet 3  . Cholecalciferol (VITAMIN D) 1000 UNITS capsule Take 1,000 Units by mouth daily.      . folic acid (FOLVITE) 1 MG tablet Take 1 mg by mouth daily.    Marland Kitchen gabapentin (NEURONTIN) 300 MG capsule Take 300 mg by mouth 2 (two) times daily.    Marland Kitchen glucose blood (ACCU-CHEK SMARTVIEW) test strip Use to check blood sugar twice a day Dx Code E11.4 100 each 6  . insulin glargine (LANTUS) 100 UNIT/ML injection Inject 0.1-0.2 mLs (10-20 Units total) into the skin daily. 10 mL 11  . lisinopril (PRINIVIL,ZESTRIL) 10 MG tablet TAKE 1 TABLET BY  MOUTH EVERY DAY 90 tablet 1  . metFORMIN (GLUCOPHAGE) 1000 MG tablet TAKE 1 TABLET BY MOUTH TWICE DAILY 60 tablet 11  . methotrexate 50 MG/2ML injection Inject into the skin once a week.     . Multiple Vitamin (MULTIVITAMIN) tablet Take 1 tablet by mouth daily.      . predniSONE (DELTASONE) 20 MG tablet Take 30 mg by mouth daily with breakfast.      No current facility-administered medications on file prior to visit.     Allergies  Allergen Reactions  . Atorvastatin Other (See Comments)    myalgias  . Pravastatin Other (See Comments)    Muscle pain    Past Medical History:  Diagnosis Date  . Allergy   . Asthma   . Diabetes mellitus   . Hyperlipidemia   . Hypertension   . Neuromuscular disorder (HCC)    polymyositis  . Osteoarthritis   . Vitamin B12 deficiency     Past Surgical History:  Procedure Laterality Date  . COLONOSCOPY WITH PROPOFOL N/A 03/07/2016   Procedure: COLONOSCOPY WITH PROPOFOL;  Surgeon: Lollie Sails, MD;  Location: [facility] ENDOSCOPY;  Service: Endoscopy;  Laterality: N/A;    Family History  Problem Relation Age of Onset  . Heart disease Mother   . Cancer Father     Social History   Socioeconomic History  . Marital status: Widowed    Spouse name: Not on file  .  Number of children: 2  . Years of education: Not on file  . Highest education level: Not on file  Occupational History  . Occupation: Retired as Training and development officer at [employer]:    Social Needs  . Financial resource strain: Not on file  . Food insecurity    Worry: Not on file    Inability: Not on file  . Transportation needs    Medical: Not on file    Non-medical: Not on file  Tobacco Use  . Smoking status: Former Research scientist (life sciences)  . Smokeless tobacco: Never Used  . Tobacco comment: age 89 when stopped smoking  Substance and Sexual Activity  . Alcohol use: No  . Drug use: No  . Sexual activity: Not on file  Lifestyle  . Physical activity    Days per week: Not on  file    Minutes per session: Not on file  . Stress: Not on file  Relationships  . Social Herbalist on phone: Not on file    Gets together: Not on file    Attends religious service: Not on file    Active member of club or organization: Not on file    Attends meetings of clubs or organizations: Not on file    Relationship status: Not on file  . Intimate partner violence    Fear of current or ex partner: Not on file    Emotionally abused: Not on file    Physically abused: Not on file    Forced sexual activity: Not on file  Other Topics Concern  . Not on file  Social History Narrative   has living will   Son and daughter should make health care decisions for her   Would accept resuscitation   Not sure about tube feeds   Review of Systems Appetite is not great---taste is not normal Weight is stable Sleeps okay Teeth okay---overdue for dentist. Full upper dentures. Counseled No rash or suspicious skin lesions No heartburn or dysphagia Bowels move fine--no blood Voids without pain or blood----some urgency (no sig incontinence)    Objective:   Physical Exam  Constitutional: She is oriented to person, place, and time. She appears well-developed. No distress.  HENT:  Mouth/Throat: Oropharynx is clear and moist. No oropharyngeal exudate.  Upper plate No oral lesions  Neck: No thyromegaly present.  Cardiovascular: Normal rate, regular rhythm, normal heart sounds and intact distal pulses. Exam reveals no gallop.  No murmur heard. Respiratory: Effort normal and breath sounds normal. No respiratory distress. She has no wheezes. She has no rales.  GI: Soft. There is no abdominal tenderness.  Musculoskeletal:        General: No tenderness.     Comments: Trace ankle edema  Lymphadenopathy:    She has no cervical adenopathy.  Neurological: She is alert and oriented to person, place, and time.  President--- "Dwaine Deter, Clinton---- ?" 100-103-no 97 [id number]---I know  that isn't right Recall 3/3  Decreased sensation in feet  Skin: No rash noted. No erythema.  No foot lesions  Psychiatric: She has a normal mood and affect. Her behavior is normal.           Assessment & Plan:

## 2019-10-22 NOTE — Progress Notes (Signed)
Hearing Screening   Method: Audiometry   125Hz  250Hz  500Hz  1000Hz  2000Hz  3000Hz  4000Hz  6000Hz  8000Hz   Right ear:   40 40 25  0    Left ear:   40 40 40  0    Vision Screening Comments: July 2020

## 2019-10-22 NOTE — Assessment & Plan Note (Addendum)
Still runs high due to prednisone but reasonably good Will check A1c Lab Results  Component Value Date   HGBA1C 9.9 (A) 10/22/2019   Much worse Will increase the lantus to 20 daily Add glimiperide 2 daily Recheck 3 months

## 2019-10-22 NOTE — Patient Instructions (Signed)
Please increase the lantus to 20 units and start glimepiride 2mg  daily with breakfast. Let me know if you have any low sugar reactions. Please set up your screening mammogrram.

## 2019-10-22 NOTE — Assessment & Plan Note (Signed)
Controlled with current Rx Unfortunately prednisone is affecting her diabetes

## 2019-10-22 NOTE — Assessment & Plan Note (Signed)
I have personally reviewed the Medicare Annual Wellness questionnaire and have noted 1. The patient's medical and social history 2. Their use of alcohol, tobacco or illicit drugs 3. Their current medications and supplements 4. The patient's functional ability including ADL's, fall risks, home safety risks and hearing or visual             impairment. 5. Diet and physical activities 6. Evidence for depression or mood disorders  The patients weight, height, BMI and visual acuity have been recorded in the chart I have made referrals, counseling and provided education to the patient based review of the above and I have provided the pt with a written personalized care plan for preventive services.  I have provided you with a copy of your personalized plan for preventive services. Please take the time to review along with your updated medication list.  Had flu vaccine Colon due 2027 Overdue for mammogram---will have her set up Discussed exercise

## 2019-10-23 ENCOUNTER — Other Ambulatory Visit: Payer: Self-pay

## 2019-10-23 ENCOUNTER — Inpatient Hospital Stay: Payer: Medicare Other

## 2019-10-23 VITALS — BP 164/82 | HR 79 | Temp 98.3°F | Resp 18

## 2019-10-23 DIAGNOSIS — M3320 Polymyositis, organ involvement unspecified: Secondary | ICD-10-CM

## 2019-10-23 MED ORDER — METHYLPREDNISOLONE SODIUM SUCC 125 MG IJ SOLR
40.0000 mg | INTRAMUSCULAR | Status: DC
  Administered 2019-10-23: 40 mg via INTRAVENOUS
  Filled 2019-10-23: qty 2

## 2019-10-23 MED ORDER — DEXTROSE 5 % IV SOLN
Freq: Once | INTRAVENOUS | Status: AC
  Administered 2019-10-23: 09:00:00 via INTRAVENOUS
  Filled 2019-10-23: qty 250

## 2019-10-23 MED ORDER — ACETAMINOPHEN 325 MG PO TABS
650.0000 mg | ORAL_TABLET | Freq: Once | ORAL | Status: AC
  Administered 2019-10-23: 650 mg via ORAL
  Filled 2019-10-23: qty 2

## 2019-10-23 MED ORDER — IMMUNE GLOBULIN (HUMAN) 20 GM/200ML IV SOLN
500.0000 mg/kg | INTRAVENOUS | Status: DC
  Administered 2019-10-23: 10:00:00 40 g via INTRAVENOUS
  Filled 2019-10-23: qty 400

## 2019-10-23 MED ORDER — DIPHENHYDRAMINE HCL 25 MG PO TABS
25.0000 mg | ORAL_TABLET | Freq: Once | ORAL | Status: AC
  Administered 2019-10-23: 25 mg via ORAL
  Filled 2019-10-23: qty 1

## 2019-10-24 ENCOUNTER — Inpatient Hospital Stay: Payer: Medicare Other

## 2019-10-24 VITALS — BP 160/84 | HR 75 | Temp 97.5°F | Resp 18

## 2019-10-24 DIAGNOSIS — M3320 Polymyositis, organ involvement unspecified: Secondary | ICD-10-CM | POA: Diagnosis not present

## 2019-10-24 MED ORDER — DEXTROSE 5 % IV SOLN
Freq: Once | INTRAVENOUS | Status: AC
  Administered 2019-10-24: 09:00:00 via INTRAVENOUS
  Filled 2019-10-24: qty 250

## 2019-10-24 MED ORDER — IMMUNE GLOBULIN (HUMAN) 20 GM/200ML IV SOLN
500.0000 mg/kg | INTRAVENOUS | Status: DC
  Administered 2019-10-24: 40 g via INTRAVENOUS
  Filled 2019-10-24: qty 400

## 2019-10-24 MED ORDER — ACETAMINOPHEN 325 MG PO TABS
650.0000 mg | ORAL_TABLET | Freq: Once | ORAL | Status: AC
  Administered 2019-10-24: 650 mg via ORAL
  Filled 2019-10-24: qty 2

## 2019-10-24 MED ORDER — DIPHENHYDRAMINE HCL 25 MG PO CAPS
ORAL_CAPSULE | ORAL | Status: AC
  Filled 2019-10-24: qty 1

## 2019-10-24 MED ORDER — DIPHENHYDRAMINE HCL 25 MG PO TABS
25.0000 mg | ORAL_TABLET | Freq: Once | ORAL | Status: AC
  Administered 2019-10-24: 25 mg via ORAL
  Filled 2019-10-24: qty 1

## 2019-10-24 MED ORDER — METHYLPREDNISOLONE SODIUM SUCC 125 MG IJ SOLR
40.0000 mg | INTRAMUSCULAR | Status: DC
  Administered 2019-10-24: 09:00:00 40 mg via INTRAVENOUS
  Filled 2019-10-24: qty 2

## 2019-10-27 ENCOUNTER — Telehealth: Payer: Self-pay | Admitting: *Deleted

## 2019-10-27 NOTE — Telephone Encounter (Signed)
Patient called stating that she saw Dr. Juleen China today and he put her on Prednisone 20 mg. Patient stated that Dr. Juleen China wanted to know if Dr. Silvio Pate would decrease her diabetic medication since her Prednisone dose has been reduced. Patient stated that she was on Prednisone 40 mg and Dr. Juleen China has reduced it to 20 mg a day. Patient stated that her blood sugar this morning was 65 and another morning it was in the 50's.

## 2019-10-27 NOTE — Telephone Encounter (Signed)
Yes---if she is running that low, she should stop the glimepiride

## 2019-10-28 NOTE — Telephone Encounter (Signed)
Spoke to pt. Changed medlist: removed glimepiride and changed prednisone to 20mg 

## 2019-11-06 ENCOUNTER — Other Ambulatory Visit: Payer: Self-pay | Admitting: Internal Medicine

## 2019-11-06 DIAGNOSIS — Z1231 Encounter for screening mammogram for malignant neoplasm of breast: Secondary | ICD-10-CM

## 2019-11-11 ENCOUNTER — Other Ambulatory Visit: Payer: Self-pay | Admitting: Internal Medicine

## 2019-11-23 NOTE — Progress Notes (Signed)
[facility]  [address] [place], [place] [address] Phone: [phone]  Fax: [phone]   Clinic Day:  11/25/2019  Referring physician: [name], MD  Chief Complaint: Bridget Romero is a 73 y.o. female with polymyositis who is seen for 1 month assessment and continuation of IVIG.   HPI: The patient was last seen in the hematology clinic on 10/21/2019. At that time, she was doing well.  She was stronger.  Gait was normal.  She was able to rise from a chair without using her arms. Hematocrit 34.2, hemoglobin 10.8, platelets 254,000, WBC 9,400. AST was 31. ALT was 32. Total CK was 251. Aldolase 7.2. She received IVIG x 4 days.     Patient was seen in the rheumatology clinicby Dr. [name] on 10/27/2019.  Symptomatically, her muscle strength continued to improve.  She was noted to sometimes having difficulty getting up from a chair without using her hands.  She was able to drive and do all of her housework.  Prednisone was decreased to 20 mg a day.  Decision was made to hold off on initiation of Rituxan.  She has a follow-up appointment in 6 weeks.  During the interim, she has felt "pretty good". She feels 80% normal.  She can perform her ADLs with no trouble.  At night, she feels pressure related to fibroids. The pressure she feels at night causes insomnia. She notes that her methotrexate is 10 mg a week.  She will see Dr. [name] on 12/08/2019.   Her potassium is 3.4 today and I advised eat potassium rich foods. I encouraged her to continue eating iron rich foods. She reports her taste is "poor", but her appetite is good.    Past Medical History:  Diagnosis Date  . Allergy   . Asthma   . Diabetes mellitus   . Hyperlipidemia   . Hypertension   . Neuromuscular disorder (HCC)    polymyositis  . Osteoarthritis   . Vitamin B12 deficiency     Past Surgical History:  Procedure Laterality Date  . COLONOSCOPY WITH PROPOFOL N/A 03/07/2016   Procedure: COLONOSCOPY WITH PROPOFOL;  Surgeon: Lollie Sails, MD;  Location: [facility] ENDOSCOPY;  Service: Endoscopy;  Laterality: N/A;    Family History  Problem Relation Age of Onset  . Heart disease Mother   . Cancer Father     Social History:  reports that she has quit smoking. She has never used smokeless tobacco. She reports that she does not drink alcohol or use drugs.  She is a Training and development officer. She lives in [place].The patient is alone today.  Allergies:  Allergies  Allergen Reactions  . Atorvastatin Other (See Comments)    myalgias  . Pravastatin Other (See Comments)    Muscle pain    Current Medications: Current Outpatient Medications  Medication Sig Dispense Refill  . ACCU-CHEK FASTCLIX LANCETS MISC Use to test blood sugar twice daily dx: 250.02 100 each 3  . acetaminophen (TYLENOL) 500 MG tablet Take 500 mg by mouth every 6 (six) hours as needed.    . Alcohol Swabs (B-D SINGLE USE SWABS REGULAR) PADS Use to test blood sugar once daily dx: 250.00 100 each 3  . alendronate (FOSAMAX) 70 MG tablet TAKE 1 TABLET EVERY 7 DAYS WITH A FULL GLASS OF WATER.(DO NOT LIE DOWN FOR THE NEXT 30 MINUTES ) 12 tablet 3  . Cholecalciferol (VITAMIN D) 1000 UNITS capsule Take 1,000 Units by mouth daily.      Marland Kitchen  folic acid (FOLVITE) 1 MG tablet Take 1 mg by mouth daily.    Marland Kitchen gabapentin (NEURONTIN) 300 MG capsule Take 300 mg by mouth 2 (two) times daily.    Marland Kitchen glucose blood (ACCU-CHEK SMARTVIEW) test strip Use to check blood sugar twice a day Dx Code E11.4 100 each 6  . insulin glargine (LANTUS) 100 UNIT/ML injection Inject 0.2 mLs (20 Units total) into the skin daily. 1 mL 0  . lisinopril (PRINIVIL,ZESTRIL) 10 MG tablet TAKE 1 TABLET BY MOUTH EVERY DAY 90 tablet 1  . metFORMIN (GLUCOPHAGE) 1000 MG tablet TAKE 1 TABLET BY MOUTH TWICE A DAY 180 tablet 3  . methotrexate 50 MG/2ML injection Inject into the skin once a week.     . Multiple Vitamin (MULTIVITAMIN) tablet Take 1 tablet by mouth daily.       . predniSONE (DELTASONE) 20 MG tablet Take 20 mg by mouth daily with breakfast.      No current facility-administered medications for this visit.     Review of Systems  Constitutional: Negative.  Negative for chills, diaphoresis, fever, malaise/fatigue and weight loss (up 4 pounds).       Feels "pretty good". 80% normal. Performing ADLs without difficulty.  HENT: Negative.  Negative for congestion, ear discharge, ear pain, hearing loss, nosebleeds, sinus pain and sore throat.   Eyes: Negative.  Negative for blurred vision, double vision, photophobia, pain and discharge.  Respiratory: Negative.  Negative for cough, hemoptysis, sputum production and shortness of breath.   Cardiovascular: Negative.  Negative for chest pain, palpitations, orthopnea, claudication, leg swelling and PND.  Gastrointestinal: Negative.  Negative for abdominal pain, blood in stool, constipation, diarrhea, heartburn, melena, nausea and vomiting.       "Poor" taste.  Appetite is good.  Genitourinary: Negative for dysuria, flank pain, frequency, hematuria and urgency.       Pressure in pelvis secondary to fibroids.  Musculoskeletal: Negative for back pain, falls, joint pain, myalgias and neck pain.       Easier time getting up from a chair.   Skin: Negative.  Negative for itching and rash.  Neurological: Negative.  Negative for dizziness, tingling, tremors, sensory change, speech change, focal weakness, weakness and headaches.  Endo/Heme/Allergies: Negative.  Does not bruise/bleed easily.       Diabetes on insulin and Metformin.   Psychiatric/Behavioral: Negative for depression and memory loss. The patient has insomnia (fibroids cause pressure sensation). The patient is not nervous/anxious.   All other systems reviewed and are negative.  Performance status (ECOG): 0-1  Vitals Blood pressure (!) 145/68, pulse 77, temperature (!) 96.9 F (36.1 C), temperature source Tympanic, weight 186 lb 1.1 oz (84.4 kg), SpO2 100 %.    Physical Exam  Constitutional: She is oriented to person, place, and time. She appears well-developed and well-nourished. No distress. Face mask in place.  HENT:  Head: Normocephalic and atraumatic.  Mouth/Throat: No oropharyngeal exudate.  Black beret. Short curly dark hair.  Mask.  Eyes: Pupils are equal, round, and reactive to light. Conjunctivae and EOM are normal. No scleral icterus.  Brown eyes.   Neck: Normal range of motion. Neck supple. No JVD present.  Cardiovascular: Normal rate, regular rhythm and normal heart sounds. Exam reveals no gallop and no friction rub.  No murmur heard. Pulmonary/Chest: Effort normal and breath sounds normal. No respiratory distress. She has no wheezes. She has no rales.  Abdominal: Soft. Bowel sounds are normal. She exhibits no distension and no mass. There is no abdominal tenderness.  There is no rebound and no guarding.  Musculoskeletal:        General: No tenderness, deformity or edema.     Comments: Gets up from chair without using arms.  Lymphadenopathy:       Head (right side): No preauricular, no posterior auricular and no occipital adenopathy present.       Head (left side): No preauricular, no posterior auricular and no occipital adenopathy present.    She has no cervical adenopathy.    She has no axillary adenopathy.       Right: No inguinal and no supraclavicular adenopathy present.       Left: No inguinal and no supraclavicular adenopathy present.  Neurological: She is alert and oriented to person, place, and time. She has normal strength. She displays no atrophy and no tremor. No cranial nerve deficit or sensory deficit. She exhibits normal muscle tone. Coordination normal.  Lower extremity strength 5/5 left and 5-/5 right.  Gait normal.  Skin: Skin is warm and dry. No rash noted. She is not diaphoretic. No erythema. No pallor.  Psychiatric: She has a normal mood and affect. Her behavior is normal. Judgment and thought content normal.   Nursing note and vitals reviewed.   Appointment on 11/25/2019  Component Date Value Ref Range Status  . Total CK 11/25/2019 67  38 - 234 U/L Final   Performed at [facility], [address], [place], [place] [address]  . Sodium 11/25/2019 138  135 - 145 mmol/L Final  . Potassium 11/25/2019 3.4* 3.5 - 5.1 mmol/L Final  . Chloride 11/25/2019 102  98 - 111 mmol/L Final  . CO2 11/25/2019 26  22 - 32 mmol/L Final  . Glucose, Bld 11/25/2019 192* 70 - 99 mg/dL Final  . BUN 11/25/2019 18  8 - 23 mg/dL Final  . Creatinine, Ser 11/25/2019 0.77  0.44 - 1.00 mg/dL Final  . Calcium 11/25/2019 9.2  8.9 - 10.3 mg/dL Final  . Total Protein 11/25/2019 7.1  6.5 - 8.1 g/dL Final  . Albumin 11/25/2019 3.6  3.5 - 5.0 g/dL Final  . AST 11/25/2019 23  15 - 41 U/L Final  . ALT 11/25/2019 23  0 - 44 U/L Final  . Alkaline Phosphatase 11/25/2019 46  38 - 126 U/L Final  . Total Bilirubin 11/25/2019 0.8  0.3 - 1.2 mg/dL Final  . GFR calc non Af Amer 11/25/2019 >60  >60 mL/min Final  . GFR calc Af Amer 11/25/2019 >60  >60 mL/min Final  . Anion gap 11/25/2019 10  5 - 15 Final   Performed at [facility], [address] [place], [place] [address]  . WBC 11/25/2019 7.9  4.0 - 10.5 K/uL Final  . RBC 11/25/2019 3.30* 3.87 - 5.11 MIL/uL Final  . Hemoglobin 11/25/2019 9.6* 12.0 - 15.0 g/dL Final  . HCT 11/25/2019 30.8* 36.0 - 46.0 % Final  . MCV 11/25/2019 93.3  80.0 - 100.0 fL Final  . MCH 11/25/2019 29.1  26.0 - 34.0 pg Final  . MCHC 11/25/2019 31.2  30.0 - 36.0 g/dL Final  . RDW 11/25/2019 15.5  11.5 - 15.5 % Final  . Platelets 11/25/2019 280  150 - 400 K/uL Final  . nRBC 11/25/2019 0.0  0.0 - 0.2 % Final  . Neutrophils Relative % 11/25/2019 59  % Final  . Neutro Abs 11/25/2019 4.8  1.7 - 7.7 K/uL Final  . Lymphocytes Relative 11/25/2019 34  % Final  . Lymphs Abs 11/25/2019  2.7  0.7 - 4.0 K/uL Final  . Monocytes Relative 11/25/2019 5  % Final  . Monocytes Absolute 11/25/2019 0.4   0.1 - 1.0 K/uL Final  . Eosinophils Relative 11/25/2019 1  % Final  . Eosinophils Absolute 11/25/2019 0.0  0.0 - 0.5 K/uL Final  . Basophils Relative 11/25/2019 0  % Final  . Basophils Absolute 11/25/2019 0.0  0.0 - 0.1 K/uL Final  . Immature Granulocytes 11/25/2019 1  % Final  . Abs Immature Granulocytes 11/25/2019 0.04  0.00 - 0.07 K/uL Final   Performed at [facility], [address], [place], [place] [address]    Assessment:  Bridget Romero is a 73 y.o. 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 She receives IVIG monthly,methotrexate, and prednisone10mg  a day.  CKhas been followed: >5555 on 05/30/2017, 4409 on 06/20/2017, 3546 on 07/05/2017, 2725 on 07/30/2017, 3675 on 08/28/2017, 2405 on 10/08/2017, 2743 on 11/08/2017, 2723 on 12/20/2017, 723 on 01/21/2018, 507 on 02/21/2018, 2192 on 04/11/2018, 2227 on 06/04/2018, 4468 on 07/22/2018, 2204 on 09/06/2018, 1360 on 12/02/2018, 1423 on 01/01/2019,912 on 03/03/2019, 3955 on 04/21/2019,4612 on 05/06/2019,4560 on 05/20/2019, 4532 on 06/16/2019,4935 on 07/10/2019,4330 on 07/28/2019, 3645 on 08/13/2019, 2514 on 08/25/2019, 807 on 09/22/2019, 251 on 10/21/2019, and 67 on 11/25/2019.  Aldolasewas 67.2 in06/05/2017, 69.9 on06/27/2018, 64.8 on07/11/2017,57.6 on08/05/2017, 60.3 on09/03/2017, 38.7 on 12/20/2017, 9.3 on01/28/2019, 7.4 on02/28/2019, 29.1 on04/18/2019, to51.9 on07/29/2019, 23.7 on09/13/2019, 16.4 on 12/02/2018,14.1 on 01/01/2019,8.8 on 03/03/2019, 53.4 on 05/06/2019,42.9 on 05/20/2019, 39.0 in 06/16/2019, 69.3 on 07/28/2019, 37.2 on 08/25/2019, 12.5 on 09/22/2019, 7.2 on 10/21/2019, and 5.2 on 11/25/2019.  She received IVIG (Privigen) 75 gm x 2 days every 4 weeks beginning 12/07/2017.IVIG was decreased to every 8 weeks on 04/22/2019, butthen returned to monthly secondary to rise CK.IVIG was increased to 2 gm/kg on 08/25/2019, 09/23/2019, and 10/21/2019.  She has a normocytic anemia. Baseline hemoglobin is  10.Last colonoscopy on 03/07/2016 revealed grade I hemorrhoids.She has B12 deficiencyand is on oral B12. B12 was 306 and folate >22.3 on 05/30/2017.   She has chronicmild elevation in LFTswhich have correlated with increased CPK.AST/ALThave been followed:53/54 on 02/17/2019, 107/128 on04/27/2020, 134/200 on05/26/2020,138/261 on 07/10/2019,151/304 on08/02/2019, 175/289 on 08/13/2019, 102/196 on 08/25/2019, and 47/75 on 09/22/2019. Hepatitis B and C testing werenegative on 06/27/2018and 07/28/2019.  Symptomatically, her strength continues to improve.  She feels "80% normal".  Lower extremity strength is nearly normal.  Plan: 1.   Labs today: CBC with diff, CMP, CK, aldolase.  2. Polymyositis Clinically, she continues to improve since increased IVIG to 2 gm/kg monthly. She is currently on prednisone 20 mg/day and MTX10 mgSQ weekly. [id number] 07/28/2019 , 807on 09/22/2019, 251 on 10/21/2019, and 67 on 11/25/2019. Aldolase69.3 on 07/28/2019 , 12.5 on 09/22/2019, and 7.2 on 10/21/2019, and 5.2 on 11/25/2019.             Follow-up with Dr [name] on 12/08/2019. 3.Elevated LFTs, resolved AST 151 and ALT 304 on 07/28/2019. AST47and ALT75on 07/28/2019.             AST 31 and ALT 32 on 10/21/2019. AST 23 and ALT 23 on 11/25/2019.  Increasedliver function testscorrelates withincreased CPK. CPK reflects activityofpolymyositis. Continue to monitor monthly. 4.Normocytic anemia, progressive  Hematocrit 34.2.  Hemoglobin 10.8.  MCV 91.7 on 10/21/2019  Hematocrit 30.8.  Hemoglobin 9.60.  MCV 93.3 on 11/25/2019.  Discuss plan for work-up:    Ferritin, iron studies, B12, folate, retic count.  Etiology likely felt secondary to MTX and anemia of chronic disease. 5.   IVIG2 gm/kg (500  mg/kg per dosedailyx4). 6.RTC in 1 month for MD assessment, labs  (CBC with diff, CMP, CK, aldolase), and IVIG over 4 days.  Addendum: Ferritin 205, iron saturation 40% with a TIBC of 278.  B12 was 716 and folate 35.0.  Retic 3.8%.  I discussed the assessment and treatment plan with the patient.  The patient was provided an opportunity to ask questions and all were answered.  The patient agreed with the plan and demonstrated an understanding of the instructions.  The patient was advised to call back if the symptoms worsen or if the condition fails to improve as anticipated.   Lequita Asal, MD, PhD    11/25/2019, 9:07 AM  I, Selena Batten, am acting as scribe for [employer]. Mike Gip, MD, PhD.  I, Melissa C. Mike Gip, MD, have reviewed the above documentation for accuracy and completeness, and I agree with the above.

## 2019-11-24 ENCOUNTER — Other Ambulatory Visit: Payer: Self-pay

## 2019-11-24 NOTE — Progress Notes (Signed)
Confirmed Name, DOB, and Address. Denies any concerns.  

## 2019-11-25 ENCOUNTER — Inpatient Hospital Stay: Payer: Medicare Other

## 2019-11-25 ENCOUNTER — Inpatient Hospital Stay: Payer: Medicare Other | Attending: Hematology and Oncology | Admitting: Hematology and Oncology

## 2019-11-25 ENCOUNTER — Encounter: Payer: Self-pay | Admitting: Hematology and Oncology

## 2019-11-25 VITALS — BP 147/78 | HR 86 | Temp 97.6°F | Resp 17

## 2019-11-25 VITALS — BP 145/68 | HR 77 | Temp 96.9°F | Wt 186.1 lb

## 2019-11-25 DIAGNOSIS — M3320 Polymyositis, organ involvement unspecified: Secondary | ICD-10-CM

## 2019-11-25 DIAGNOSIS — R7989 Other specified abnormal findings of blood chemistry: Secondary | ICD-10-CM

## 2019-11-25 DIAGNOSIS — D649 Anemia, unspecified: Secondary | ICD-10-CM

## 2019-11-25 LAB — COMPREHENSIVE METABOLIC PANEL
ALT: 23 U/L (ref 0–44)
AST: 23 U/L (ref 15–41)
Albumin: 3.6 g/dL (ref 3.5–5.0)
Alkaline Phosphatase: 46 U/L (ref 38–126)
Anion gap: 10 (ref 5–15)
BUN: 18 mg/dL (ref 8–23)
CO2: 26 mmol/L (ref 22–32)
Calcium: 9.2 mg/dL (ref 8.9–10.3)
Chloride: 102 mmol/L (ref 98–111)
Creatinine, Ser: 0.77 mg/dL (ref 0.44–1.00)
GFR calc Af Amer: >60 mL/min (ref >60)
GFR calc non Af Amer: >60 mL/min (ref >60)
Glucose, Bld: 192 mg/dL — ABNORMAL HIGH (ref 70–99)
Potassium: 3.4 mmol/L — ABNORMAL LOW (ref 3.5–5.1)
Sodium: 138 mmol/L (ref 135–145)
Total Bilirubin: 0.8 mg/dL (ref 0.3–1.2)
Total Protein: 7.1 g/dL (ref 6.5–8.1)

## 2019-11-25 LAB — RETICULOCYTES
Immature Retic Fract: 20.8 % — ABNORMAL HIGH (ref 2.3–15.9)
RBC.: 3.31 MIL/uL — ABNORMAL LOW (ref 3.87–5.11)
Retic Count, Absolute: 124.5 10*3/uL (ref 19.0–186.0)
Retic Ct Pct: 3.8 % — ABNORMAL HIGH (ref 0.4–3.1)

## 2019-11-25 LAB — CBC WITH DIFFERENTIAL/PLATELET
Abs Immature Granulocytes: 0.04 10*3/uL (ref 0.00–0.07)
Basophils Absolute: 0 10*3/uL (ref 0.0–0.1)
Basophils Relative: 0 %
Eosinophils Absolute: 0 10*3/uL (ref 0.0–0.5)
Eosinophils Relative: 1 %
HCT: 30.8 % — ABNORMAL LOW (ref 36.0–46.0)
Hemoglobin: 9.6 g/dL — ABNORMAL LOW (ref 12.0–15.0)
Immature Granulocytes: 1 %
Lymphocytes Relative: 34 %
Lymphs Abs: 2.7 10*3/uL (ref 0.7–4.0)
MCH: 29.1 pg (ref 26.0–34.0)
MCHC: 31.2 g/dL (ref 30.0–36.0)
MCV: 93.3 fL (ref 80.0–100.0)
Monocytes Absolute: 0.4 10*3/uL (ref 0.1–1.0)
Monocytes Relative: 5 %
Neutro Abs: 4.8 10*3/uL (ref 1.7–7.7)
Neutrophils Relative %: 59 %
Platelets: 280 10*3/uL (ref 150–400)
RBC: 3.3 MIL/uL — ABNORMAL LOW (ref 3.87–5.11)
RDW: 15.5 % (ref 11.5–15.5)
WBC: 7.9 10*3/uL (ref 4.0–10.5)
nRBC: 0 % (ref 0.0–0.2)

## 2019-11-25 LAB — FERRITIN: Ferritin: 205 ng/mL (ref 11–307)

## 2019-11-25 LAB — VITAMIN B12: Vitamin B-12: 716 pg/mL (ref 180–914)

## 2019-11-25 LAB — IRON AND TIBC
Iron: 111 ug/dL (ref 28–170)
Saturation Ratios: 40 % — ABNORMAL HIGH (ref 10.4–31.8)
TIBC: 278 ug/dL (ref 250–450)
UIBC: 167 ug/dL

## 2019-11-25 LAB — FOLATE: Folate: 35 ng/mL (ref >5.9)

## 2019-11-25 LAB — CK: Total CK: 67 U/L (ref 38–234)

## 2019-11-25 MED ORDER — METHYLPREDNISOLONE SODIUM SUCC 125 MG IJ SOLR
40.0000 mg | INTRAMUSCULAR | Status: DC
  Administered 2019-11-25: 40 mg via INTRAVENOUS
  Filled 2019-11-25: qty 2

## 2019-11-25 MED ORDER — DIPHENHYDRAMINE HCL 25 MG PO TABS
25.0000 mg | ORAL_TABLET | Freq: Once | ORAL | Status: AC
  Administered 2019-11-25: 25 mg via ORAL
  Filled 2019-11-25: qty 1

## 2019-11-25 MED ORDER — DEXTROSE 5 % IV SOLN
Freq: Once | INTRAVENOUS | Status: AC
  Administered 2019-11-25: 10:00:00 via INTRAVENOUS
  Filled 2019-11-25: qty 250

## 2019-11-25 MED ORDER — ACETAMINOPHEN 325 MG PO TABS
650.0000 mg | ORAL_TABLET | Freq: Once | ORAL | Status: AC
  Administered 2019-11-25: 650 mg via ORAL
  Filled 2019-11-25: qty 2

## 2019-11-25 MED ORDER — IMMUNE GLOBULIN (HUMAN) 20 GM/200ML IV SOLN
500.0000 mg/kg | INTRAVENOUS | Status: DC
  Administered 2019-11-25: 40 g via INTRAVENOUS
  Filled 2019-11-25: qty 400

## 2019-11-25 MED ORDER — DIPHENHYDRAMINE HCL 25 MG PO CAPS
ORAL_CAPSULE | ORAL | Status: AC
  Filled 2019-11-25: qty 1

## 2019-11-25 NOTE — Patient Instructions (Signed)
Immune Globulin Injection What is this medicine? IMMUNE GLOBULIN (im MUNE GLOB yoo lin) helps to prevent or reduce the severity of certain infections in patients who are at risk. This medicine is collected from the pooled blood of many donors. It is used to treat immune system problems, thrombocytopenia, and Kawasaki syndrome. This medicine may be used for other purposes; ask your health care provider or pharmacist if you have questions. COMMON BRAND NAME(S): ASCENIV, Baygam, BIVIGAM, Carimune, Carimune NF, cutaquig, Cuvitru, Flebogamma, Flebogamma DIF, GamaSTAN, GamaSTAN S/D, Gamimune N, Gammagard, Gammagard S/D, Gammaked, Gammaplex, Gammar-P IV, Gamunex, Gamunex-C, Hizentra, Iveegam, Iveegam EN, Octagam, Panglobulin, Panglobulin NF, panzyga, Polygam S/D, Privigen, Sandoglobulin, Venoglobulin-S, Vigam, Vivaglobulin, Xembify What should I tell my health care provider before I take this medicine? They need to know if you have any of these conditions:   diabetes   extremely low or no immune antibodies in the blood   heart disease   history of blood clots   hyperprolinemia   infection in the blood, sepsis   kidney disease   taking medicine that may change kidney function - ask your health care provider about your medicine   an unusual or allergic reaction to human immune globulin, albumin, maltose, sucrose, polysorbate 80, other medicines, foods, dyes, or preservatives   pregnant or trying to get pregnant   breast-feeding How should I use this medicine? This medicine is for injection into a muscle or infusion into a vein or skin. It is usually given by a health care professional in a hospital or clinic setting. In rare cases, some brands of this medicine might be given at home. You will be taught how to give this medicine. Use exactly as directed. Take your medicine at regular intervals. Do not take your medicine more often than directed. Talk to your pediatrician regarding  the use of this medicine in children. Special care may be needed. Overdosage: If you think you have taken too much of this medicine contact a poison control center or emergency room at once. NOTE: This medicine is only for you. Do not share this medicine with others. What if I miss a dose? It is important not to miss your dose. Call your doctor or health care professional if you are unable to keep an appointment. If you give yourself the medicine and you miss a dose, take it as soon as you can. If it is almost time for your next dose, take only that dose. Do not take double or extra doses. What may interact with this medicine?  aspirin and aspirin-like medicines  cisplatin  cyclosporine  medicines for infection like acyclovir, adefovir, amphotericin B, bacitracin, cidofovir, foscarnet, ganciclovir, gentamicin, pentamidine, vancomycin  NSAIDS, medicines for pain and inflammation, like ibuprofen or naproxen  pamidronate  vaccines  zoledronic acid This list may not describe all possible interactions. Give your health care provider a list of all the medicines, herbs, non-prescription drugs, or dietary supplements you use. Also tell them if you smoke, drink alcohol, or use illegal drugs. Some items may interact with your medicine. What should I watch for while using this medicine? Your condition will be monitored carefully while you are receiving this medicine. This medicine is made from pooled blood donations of many different people. It may be possible to pass an infection in this medicine. However, the donors are screened for infections and all products are tested for HIV and hepatitis. The medicine is treated to kill most or all bacteria and viruses. Talk to your doctor about   the risks and benefits of this medicine. Do not have vaccinations for at least 14 days before, or until at least 3 months after receiving this medicine. What side effects may I notice from receiving this  medicine? Side effects that you should report to your doctor or health care professional as soon as possible:  allergic reactions like skin rash, itching or hives, swelling of the face, lips, or tongue  breathing problems  chest pain or tightness  fever, chills  headache with nausea, vomiting  neck pain or difficulty moving neck  pain when moving eyes  pain, swelling, warmth in the leg  problems with balance, talking, walking  sudden weight gain  swelling of the ankles, feet, hands  trouble passing urine or change in the amount of urine Side effects that usually do not require medical attention (report to your doctor or health care professional if they continue or are bothersome):  dizzy, drowsy  flushing  increased sweating  leg cramps  muscle aches and pains  pain at site where injected This list may not describe all possible side effects. Call your doctor for medical advice about side effects. You may report side effects to FDA at 1-800-FDA-1088. Where should I keep my medicine? Keep out of the reach of children. This drug is usually given in a hospital or clinic and will not be stored at home. In rare cases, some brands of this medicine may be given at home. If you are using this medicine at home, you will be instructed on how to store this medicine. Throw away any unused medicine after the expiration date on the label. NOTE: This sheet is a summary. It may not cover all possible information. If you have questions about this medicine, talk to your doctor, pharmacist, or health care provider.  2020 Elsevier/Gold Standard (2009-03-03 11:44:49)  

## 2019-11-26 ENCOUNTER — Other Ambulatory Visit: Payer: Self-pay

## 2019-11-26 ENCOUNTER — Inpatient Hospital Stay: Payer: Medicare Other

## 2019-11-26 VITALS — BP 150/82 | HR 83 | Temp 97.6°F | Resp 18

## 2019-11-26 DIAGNOSIS — M3320 Polymyositis, organ involvement unspecified: Secondary | ICD-10-CM

## 2019-11-26 LAB — ALDOLASE: Aldolase: 5.2 U/L (ref 3.3–10.3)

## 2019-11-26 MED ORDER — ACETAMINOPHEN 325 MG PO TABS
650.0000 mg | ORAL_TABLET | Freq: Once | ORAL | Status: AC
  Administered 2019-11-26: 650 mg via ORAL

## 2019-11-26 MED ORDER — IMMUNE GLOBULIN (HUMAN) 20 GM/200ML IV SOLN
500.0000 mg/kg | INTRAVENOUS | Status: DC
  Administered 2019-11-26: 40 g via INTRAVENOUS
  Filled 2019-11-26: qty 400

## 2019-11-26 MED ORDER — DEXTROSE 5 % IV SOLN
Freq: Once | INTRAVENOUS | Status: AC
  Administered 2019-11-26: 09:00:00 via INTRAVENOUS
  Filled 2019-11-26: qty 250

## 2019-11-26 MED ORDER — DIPHENHYDRAMINE HCL 25 MG PO CAPS
25.0000 mg | ORAL_CAPSULE | Freq: Once | ORAL | Status: AC
  Administered 2019-11-26: 25 mg via ORAL
  Filled 2019-11-26: qty 1

## 2019-11-26 MED ORDER — METHYLPREDNISOLONE SODIUM SUCC 125 MG IJ SOLR
40.0000 mg | INTRAMUSCULAR | Status: DC
  Administered 2019-11-26: 40 mg via INTRAVENOUS

## 2019-11-27 ENCOUNTER — Inpatient Hospital Stay: Payer: Medicare Other

## 2019-11-27 VITALS — BP 165/81 | HR 88 | Temp 97.2°F | Resp 17

## 2019-11-27 DIAGNOSIS — M3320 Polymyositis, organ involvement unspecified: Secondary | ICD-10-CM | POA: Diagnosis not present

## 2019-11-27 MED ORDER — IMMUNE GLOBULIN (HUMAN) 20 GM/200ML IV SOLN
500.0000 mg/kg | INTRAVENOUS | Status: DC
  Administered 2019-11-27: 40 g via INTRAVENOUS
  Filled 2019-11-27: qty 400

## 2019-11-27 MED ORDER — DIPHENHYDRAMINE HCL 25 MG PO CAPS
25.0000 mg | ORAL_CAPSULE | Freq: Once | ORAL | Status: AC
  Administered 2019-11-27: 25 mg via ORAL
  Filled 2019-11-27: qty 1

## 2019-11-27 MED ORDER — METHYLPREDNISOLONE SODIUM SUCC 125 MG IJ SOLR
40.0000 mg | INTRAMUSCULAR | Status: DC
  Administered 2019-11-27: 40 mg via INTRAVENOUS
  Filled 2019-11-27: qty 2

## 2019-11-27 MED ORDER — ACETAMINOPHEN 325 MG PO TABS
650.0000 mg | ORAL_TABLET | Freq: Once | ORAL | Status: AC
  Administered 2019-11-27: 650 mg via ORAL
  Filled 2019-11-27: qty 2

## 2019-11-27 MED ORDER — DEXTROSE 5 % IV SOLN
Freq: Once | INTRAVENOUS | Status: AC
  Administered 2019-11-27: 09:00:00 via INTRAVENOUS
  Filled 2019-11-27: qty 250

## 2019-11-28 ENCOUNTER — Other Ambulatory Visit: Payer: Self-pay

## 2019-11-28 ENCOUNTER — Inpatient Hospital Stay: Payer: Medicare Other

## 2019-11-28 VITALS — BP 154/83 | HR 82 | Temp 97.1°F | Resp 18

## 2019-11-28 DIAGNOSIS — M3320 Polymyositis, organ involvement unspecified: Secondary | ICD-10-CM

## 2019-11-28 MED ORDER — DEXTROSE 5 % IV SOLN
Freq: Once | INTRAVENOUS | Status: AC
  Administered 2019-11-28: 09:00:00 via INTRAVENOUS
  Filled 2019-11-28: qty 250

## 2019-11-28 MED ORDER — IMMUNE GLOBULIN (HUMAN) 20 GM/200ML IV SOLN
500.0000 mg/kg | INTRAVENOUS | Status: DC
  Administered 2019-11-28: 40 g via INTRAVENOUS
  Filled 2019-11-28: qty 400

## 2019-11-28 MED ORDER — ACETAMINOPHEN 325 MG PO TABS
650.0000 mg | ORAL_TABLET | Freq: Once | ORAL | Status: AC
  Administered 2019-11-28: 650 mg via ORAL

## 2019-11-28 MED ORDER — DIPHENHYDRAMINE HCL 25 MG PO TABS
25.0000 mg | ORAL_TABLET | Freq: Once | ORAL | Status: AC
  Administered 2019-11-28: 25 mg via ORAL
  Filled 2019-11-28: qty 1

## 2019-11-28 MED ORDER — METHYLPREDNISOLONE SODIUM SUCC 125 MG IJ SOLR
40.0000 mg | INTRAMUSCULAR | Status: DC
  Administered 2019-11-28: 40 mg via INTRAVENOUS

## 2019-12-15 ENCOUNTER — Telehealth: Payer: Self-pay

## 2019-12-15 NOTE — Telephone Encounter (Signed)
Dr [name] cut her prednisone to 10mg  daily now. Her fasting blood sugars have been 90s to 110s. Random 140. Asking if she needs to cut back on Lantus or Metformin.

## 2019-12-15 NOTE — Telephone Encounter (Signed)
Spoke to pt. She is on 20 units. She will cut back to 10 units and continue her metformin. She will let us know if she has low sugar readings.

## 2019-12-15 NOTE — Telephone Encounter (Signed)
Yes Confirm she is still taking 20 units of the lantus. If so, have her cut to 10 units. If she continues to have low sugars, she should then stop the lantus. She should continue the metformin regardless

## 2019-12-20 NOTE — Progress Notes (Signed)
[facility]  [address], [address] [place], [place] [address] Phone: [phone]  Fax: [phone]   Clinic Day:  12/22/2019  Referring physician: [name], MD  Chief Complaint: Bridget Romero is a 73 y.o. female with polymyositis who is seen for 1 month assessment and continuation of IVIG   HPI: The patient was last seen in the hematology clinic on 11/25/2019. At that time, her strength continued to improve.  She feels "80% normal".  Lower extremity strength was nearly normal.  CK was 67 (normal).  Aldolase was 5.2 (normal). LFTs were normal.  Hematocrit was 30.8, hemoglobin 9.6, MCV 93.3., platelets 280,000, WBC 7900 with an ANC of 4800.  Ferritin was 205 with an iron saturation 40% and a TIBC of 278.  B12 was 716 and folate 35.0.  Retic 3.8%.  She received IVIG (0.5 gm/kg x 4 days) from 11/25/2019 to 11/28/2019.   She was seen by Dr [name] on 12/08/2019.  Prednisone was decreased to 10 mg/day.  She has a follow-up after her next IVIG to consider spreading out her IVIG.  Rituxan remains on hold.   During the interim, she has felt good. The strength in her legs has improved. The pressure in her abdomen is better and can get out of a chair a lot better.  She has trouble sleeping.  Symptomatically, she feels "90% normal".  Her taste isn't that good; she can tastes sweets better than anything.  She has been gaining weight.   Past Medical History:  Diagnosis Date  . Allergy   . Asthma   . Diabetes mellitus   . Hyperlipidemia   . Hypertension   . Neuromuscular disorder (HCC)    polymyositis  . Osteoarthritis   . Vitamin B12 deficiency     Past Surgical History:  Procedure Laterality Date  . COLONOSCOPY WITH PROPOFOL N/A 03/07/2016   Procedure: COLONOSCOPY WITH PROPOFOL;  Surgeon: Lollie Sails, MD;  Location: [facility] ENDOSCOPY;  Service: Endoscopy;  Laterality: N/A;    Family History  Problem Relation Age of Onset  . Heart disease  Mother   . Cancer Father     Social History:  reports that she has quit smoking. She has never used smokeless tobacco. She reports that she does not drink alcohol or use drugs. She is a Training and development officer. She lives in [place]. The patient is alone today.  Allergies:  Allergies  Allergen Reactions  . Atorvastatin Other (See Comments)    myalgias  . Pravastatin Other (See Comments)    Muscle pain    Current Medications: Current Outpatient Medications  Medication Sig Dispense Refill  . ACCU-CHEK FASTCLIX LANCETS MISC Use to test blood sugar twice daily dx: 250.02 100 each 3  . acetaminophen (TYLENOL) 500 MG tablet Take 500 mg by mouth every 6 (six) hours as needed.    . Alcohol Swabs (B-D SINGLE USE SWABS REGULAR) PADS Use to test blood sugar once daily dx: 250.00 100 each 3  . alendronate (FOSAMAX) 70 MG tablet TAKE 1 TABLET EVERY 7 DAYS WITH A FULL GLASS OF WATER.(DO NOT LIE DOWN FOR THE NEXT 30 MINUTES ) 12 tablet 3  . Cholecalciferol (VITAMIN D) 1000 UNITS capsule Take 1,000 Units by mouth daily.      . folic acid (FOLVITE) 1 MG tablet Take 1 mg by mouth daily.    Marland Kitchen gabapentin (NEURONTIN) 300 MG capsule Take 300 mg by mouth 2 (two) times daily.    Marland Kitchen glucose blood (ACCU-CHEK SMARTVIEW) test  strip Use to check blood sugar twice a day Dx Code E11.4 100 each 6  . insulin glargine (LANTUS) 100 UNIT/ML injection Inject 0.2 mLs (20 Units total) into the skin daily. 1 mL 0  . lisinopril (PRINIVIL,ZESTRIL) 10 MG tablet TAKE 1 TABLET BY MOUTH EVERY DAY 90 tablet 1  . metFORMIN (GLUCOPHAGE) 1000 MG tablet TAKE 1 TABLET BY MOUTH TWICE A DAY 180 tablet 3  . methotrexate 50 MG/2ML injection Inject into the skin once a week.     . Multiple Vitamin (MULTIVITAMIN) tablet Take 1 tablet by mouth daily.      . predniSONE (DELTASONE) 10 MG tablet Take 10 mg by mouth daily.    . predniSONE (DELTASONE) 20 MG tablet Take 10 mg by mouth daily with breakfast.      No current facility-administered medications for  this visit.    Review of Systems  Constitutional: Negative.  Negative for chills, diaphoresis, fever, malaise/fatigue and weight loss (up 2 pounds).       Feels "good". 90% normal.  HENT: Negative.  Negative for congestion, ear discharge, ear pain, hearing loss, nosebleeds, sinus pain and sore throat.   Eyes: Negative.  Negative for blurred vision, double vision, photophobia, pain and discharge.  Respiratory: Negative.  Negative for cough, hemoptysis, sputum production and shortness of breath.   Cardiovascular: Negative.  Negative for chest pain, palpitations, orthopnea, claudication and leg swelling.  Gastrointestinal: Negative.  Negative for abdominal pain, blood in stool, constipation, diarrhea, heartburn, melena, nausea and vomiting.       "Poor" taste.  Sweets taste best.  Genitourinary: Negative for dysuria, flank pain, frequency, hematuria and urgency.       Pressure in pelvis secondary to fibroids.  Musculoskeletal: Negative for back pain, falls, joint pain, myalgias and neck pain.       Easily gets up from a chair.   Skin: Negative.  Negative for itching and rash.  Neurological: Negative.  Negative for dizziness, tingling, tremors, sensory change, speech change, focal weakness, weakness and headaches.  Endo/Heme/Allergies: Negative.  Does not bruise/bleed easily.       Diabetes on insulin and Metformin.   Psychiatric/Behavioral: Negative.  Negative for depression and memory loss. The patient is not nervous/anxious and does not have insomnia (fibroids cause pressure sensation).   All other systems reviewed and are negative.  Performance status (ECOG):  1  Vitals Blood pressure (!) 144/88, pulse 84, temperature (!) 96.5 F (35.8 C), temperature source Tympanic, weight 188 lb 9.7 oz (85.6 kg), SpO2 100 %.   Physical Exam  Constitutional: She is oriented to person, place, and time. She appears well-developed. No distress. Face mask in place.  HENT:  Head: Normocephalic and  atraumatic.  Mouth/Throat: No oropharyngeal exudate.  Brown cap. Short curly dark hair.  Mask.  Eyes: Pupils are equal, round, and reactive to light. Conjunctivae and EOM are normal. No scleral icterus.  Brown eyes.   Neck: No JVD present.  Cardiovascular: Normal rate, regular rhythm and normal heart sounds. Exam reveals no gallop and no friction rub.  No murmur heard. Pulmonary/Chest: Effort normal and breath sounds normal. No respiratory distress. She has no wheezes. She has no rales.  Abdominal: Soft. Bowel sounds are normal. She exhibits no distension and no mass. There is no abdominal tenderness. There is no rebound and no guarding.  Musculoskeletal:        General: No tenderness, deformity or edema.     Cervical back: Normal range of motion and neck supple.  Comments: Gets up from chair with ease.  Lymphadenopathy:       Head (right side): No preauricular, no posterior auricular and no occipital adenopathy present.       Head (left side): No preauricular, no posterior auricular and no occipital adenopathy present.    She has no cervical adenopathy.    She has no axillary adenopathy.       Right: No supraclavicular adenopathy present.       Left: No supraclavicular adenopathy present.  Neurological: She is alert and oriented to person, place, and time. She has normal strength. She displays no atrophy and no tremor. No cranial nerve deficit or sensory deficit. She exhibits normal muscle tone. Coordination normal.  Lower extremity strength 5/5 left and 5-/5 right.  Gait is normal.  Skin: Skin is warm and dry. No rash noted. She is not diaphoretic. No erythema. No pallor.  Psychiatric: She has a normal mood and affect. Her behavior is normal. Thought content normal.  Nursing note and vitals reviewed.   Appointment on 12/22/2019  Component Date Value Ref Range Status  . LDH 12/22/2019 178  98 - 192 U/L Final   Performed at [facility], [address],  [place], [place] [address]  . Total CK 12/22/2019 66  38 - 234 U/L Final   Performed at [facility], [address], [place], [place] [address]  . Sodium 12/22/2019 137  135 - 145 mmol/L Final  . Potassium 12/22/2019 3.7  3.5 - 5.1 mmol/L Final  . Chloride 12/22/2019 103  98 - 111 mmol/L Final  . CO2 12/22/2019 27  22 - 32 mmol/L Final  . Glucose, Bld 12/22/2019 220* 70 - 99 mg/dL Final  . BUN 12/22/2019 18  8 - 23 mg/dL Final  . Creatinine, Ser 12/22/2019 0.64  0.44 - 1.00 mg/dL Final  . Calcium 12/22/2019 8.7* 8.9 - 10.3 mg/dL Final  . Total Protein 12/22/2019 6.9  6.5 - 8.1 g/dL Final  . Albumin 12/22/2019 3.4* 3.5 - 5.0 g/dL Final  . AST 12/22/2019 19  15 - 41 U/L Final  . ALT 12/22/2019 16  0 - 44 U/L Final  . Alkaline Phosphatase 12/22/2019 40  38 - 126 U/L Final  . Total Bilirubin 12/22/2019 0.6  0.3 - 1.2 mg/dL Final  . GFR calc non Af Amer 12/22/2019 >60  >60 mL/min Final  . GFR calc Af Amer 12/22/2019 >60  >60 mL/min Final  . Anion gap 12/22/2019 7  5 - 15 Final   Performed at [facility], [address], [place], [place] [address]  . WBC 12/22/2019 6.2  4.0 - 10.5 K/uL Final  . RBC 12/22/2019 3.49* 3.87 - 5.11 MIL/uL Final  . Hemoglobin 12/22/2019 10.0* 12.0 - 15.0 g/dL Final  . HCT 12/22/2019 32.4* 36.0 - 46.0 % Final  . MCV 12/22/2019 92.8  80.0 - 100.0 fL Final  . MCH 12/22/2019 28.7  26.0 - 34.0 pg Final  . MCHC 12/22/2019 30.9  30.0 - 36.0 g/dL Final  . RDW 12/22/2019 15.1  11.5 - 15.5 % Final  . Platelets 12/22/2019 258  150 - 400 K/uL Final  . nRBC 12/22/2019 0.0  0.0 - 0.2 % Final  . Neutrophils Relative % 12/22/2019 58  % Final  . Neutro Abs 12/22/2019 3.7  1.7 - 7.7 K/uL Final  . Lymphocytes Relative 12/22/2019 31  % Final  . Lymphs Abs 12/22/2019 1.9  0.7 - 4.0 K/uL Final  .  Monocytes Relative 12/22/2019 7  % Final  . Monocytes Absolute 12/22/2019 0.4  0.1 - 1.0 K/uL Final  . Eosinophils Relative 12/22/2019 2  % Final  . Eosinophils  Absolute 12/22/2019 0.1  0.0 - 0.5 K/uL Final  . Basophils Relative 12/22/2019 1  % Final  . Basophils Absolute 12/22/2019 0.0  0.0 - 0.1 K/uL Final  . Immature Granulocytes 12/22/2019 1  % Final  . Abs Immature Granulocytes 12/22/2019 0.03  0.00 - 0.07 K/uL Final   Performed at [facility], [address] [place], [place] [address]    Assessment:  Bridget Romero is a 73 y.o. 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 She receives IVIG monthly,methotrexate, and prednisone10mg  a day.  CKhas been followed: >5555 on 05/30/2017, 4409 on 06/20/2017, 3546 on 07/05/2017, 2725 on 07/30/2017, 3675 on 08/28/2017, 2405 on 10/08/2017, 2743 on 11/08/2017, 2723 on 12/20/2017, 723 on 01/21/2018, 507 on 02/21/2018, 2192 on 04/11/2018, 2227 on 06/04/2018, 4468 on 07/22/2018, 2204 on 09/06/2018, 1360 on 12/02/2018, 1423 on 01/01/2019,912 on 03/03/2019, 3955 on 04/21/2019,4612 on 05/06/2019,4560 on 05/20/2019, 4532 on 06/16/2019,4935 on 07/10/2019,4330 on 07/28/2019, 3645 on 08/13/2019, 2514 on 08/25/2019, 807 on 09/22/2019, 251 on 10/21/2019, 67 on 11/25/2019, and 66 on 12/22/2019.  Aldolasewas 67.2 in06/05/2017, 69.9 on06/27/2018, 64.8 on07/11/2017,57.6 on08/05/2017, 60.3 on09/03/2017, 38.7 on 12/20/2017, 9.3 on01/28/2019, 7.4 on02/28/2019, 29.1 on04/18/2019, to51.9 on07/29/2019, 23.7 on09/13/2019, 16.4 on 12/02/2018,14.1 on 01/01/2019,8.8 on 03/03/2019, 53.4 on 05/06/2019,42.9 on 05/20/2019, 39.0 in 06/16/2019, 69.3 on 07/28/2019, 37.2 on 08/25/2019, 12.5 on 09/22/2019, 7.2 on 10/21/2019, 5.2 on 11/25/2019, and 4.8 on 12/22/2019.  She received IVIG (Privigen) 75 gm x 2 days every 4 weeks beginning 12/07/2017.IVIG was decreased to every 8 weeks on 04/22/2019, butthen returned to monthly secondary to rise CK.IVIG was increased to 2 gm/kg on 08/25/2019, 09/23/2019, 10/21/2019, and 11/25/2019.  She has a normocytic anemia. Baseline hemoglobin is 10.Last colonoscopy on 03/07/2016  revealed grade I hemorrhoids.She has B12 deficiencyand is on oral B12. B12 was 306 and folate >22.3 on 05/30/2017.   She has chronicmild elevation in LFTswhich have correlated with increased CPK.AST/ALThave been followed:53/54 on 02/17/2019, 107/128 on04/27/2020, 134/200 on05/26/2020,138/261 on 07/10/2019,151/304 on08/02/2019, 175/289 on 08/13/2019, 102/196 on 08/25/2019, 47/75 on 09/22/2019, 12/23 on 11/25/2019, and 19/16 on 12/22/2019. Hepatitis B and C testing werenegative on 06/27/2018and 07/28/2019.  Symptomatically, she is doing well.  Lower extremity strength is 5/5 left and 5-/5 right.   Plan: 1.   Labs today: CBC with diff, CMP, CK, aldolase. 2.   Polymyositis Clinically, she is doing extremely well on IVIG to 2 gm/kg monthly. She is currently on prednisone10mg /day and MTX10 mgSQweekly. CKis normal:   4330on 07/28/2019,807on 09/22/2019, 251 on 10/21/2019, 67 on 11/25/2019, and 66 on 12/22/2019. Aldolaseis normal:   69.3 on 07/28/2019, 12.5 on 09/22/2019, and 7.2 on 10/21/2019, 5.2 on 11/25/2019, and 4.8 on 12/22/2019. Anticipate follow-up with Dr [name] prior to next infusion. 3.Elevated LFTs, resolved AST 151 and ALT 304 on 07/28/2019. AST47and ALT75on 09/22/2019. AST 31 and ALT 32 on 10/21/2019. AST 23 and ALT 23 on 11/25/2019.  AST 19 and ALT 16 on 12/22/2019.             Increasedliver function testscorrelates withincreased CPK. CPK reflects activityofpolymyositis. Continue to monitor monthly. 4.Normocytic anemia             Hematocrit 34.2.  Hemoglobin 10.8.  MCV 91.7 on 10/21/2019             Hematocrit 30.8.  Hemoglobin 9.60.  MCV 93.3 on 11/25/2019.  Hematocrit 32.4.  Hemoglobin 10.0.  MCV 92.8 on 12/22/2019.                         Ferritin 205 with an iron sat 40% and a TIBC  278.   B12 716 and folate 35.0.   Retic was 3.8%.   Coombs negative and LDH 178.             Etiology likely felt secondary to MTX and anemia of chronic disease.  No evidence of IVIG associated with + direct Coombs (<= 47%) and anemia (6-11%). 5.   Begin IVIG2 gm/kg(500 mg/kg per dosedailyx4). 6.   RTC in 1 month for MD assessment, labs (CBC with diff, CMP, CK, aldolase), and IVIG over 4 days.  I discussed the assessment and treatment plan with the patient.  The patient was provided an opportunity to ask questions and all were answered.  The patient agreed with the plan and demonstrated an understanding of the instructions.  The patient was advised to call back if the symptoms worsen or if the condition fails to improve as anticipated.    Lequita Asal, MD, PhD    12/22/2019, 9:48 AM  I, Samul Dada, am acting as a scribe for Lequita Asal, MD.  I, [place] Mike Gip, MD, have reviewed the above documentation for accuracy and completeness, and I agree with the above.

## 2019-12-22 ENCOUNTER — Other Ambulatory Visit: Payer: Self-pay

## 2019-12-22 ENCOUNTER — Encounter: Payer: Self-pay | Admitting: Hematology and Oncology

## 2019-12-22 ENCOUNTER — Inpatient Hospital Stay: Payer: Medicare Other

## 2019-12-22 ENCOUNTER — Inpatient Hospital Stay (HOSPITAL_BASED_OUTPATIENT_CLINIC_OR_DEPARTMENT_OTHER): Payer: Medicare Other | Admitting: Hematology and Oncology

## 2019-12-22 ENCOUNTER — Other Ambulatory Visit: Payer: Self-pay | Admitting: Hematology and Oncology

## 2019-12-22 VITALS — BP 161/81 | HR 77 | Temp 97.8°F | Resp 18

## 2019-12-22 VITALS — BP 144/88 | HR 84 | Temp 96.5°F | Wt 188.6 lb

## 2019-12-22 DIAGNOSIS — M3320 Polymyositis, organ involvement unspecified: Secondary | ICD-10-CM

## 2019-12-22 DIAGNOSIS — D649 Anemia, unspecified: Secondary | ICD-10-CM

## 2019-12-22 DIAGNOSIS — R7989 Other specified abnormal findings of blood chemistry: Secondary | ICD-10-CM

## 2019-12-22 LAB — CBC WITH DIFFERENTIAL/PLATELET
Abs Immature Granulocytes: 0.03 10*3/uL (ref 0.00–0.07)
Basophils Absolute: 0 10*3/uL (ref 0.0–0.1)
Basophils Relative: 1 %
Eosinophils Absolute: 0.1 10*3/uL (ref 0.0–0.5)
Eosinophils Relative: 2 %
HCT: 32.4 % — ABNORMAL LOW (ref 36.0–46.0)
Hemoglobin: 10 g/dL — ABNORMAL LOW (ref 12.0–15.0)
Immature Granulocytes: 1 %
Lymphocytes Relative: 31 %
Lymphs Abs: 1.9 10*3/uL (ref 0.7–4.0)
MCH: 28.7 pg (ref 26.0–34.0)
MCHC: 30.9 g/dL (ref 30.0–36.0)
MCV: 92.8 fL (ref 80.0–100.0)
Monocytes Absolute: 0.4 10*3/uL (ref 0.1–1.0)
Monocytes Relative: 7 %
Neutro Abs: 3.7 10*3/uL (ref 1.7–7.7)
Neutrophils Relative %: 58 %
Platelets: 258 10*3/uL (ref 150–400)
RBC: 3.49 MIL/uL — ABNORMAL LOW (ref 3.87–5.11)
RDW: 15.1 % (ref 11.5–15.5)
WBC: 6.2 10*3/uL (ref 4.0–10.5)
nRBC: 0 % (ref 0.0–0.2)

## 2019-12-22 LAB — RETICULOCYTES
Immature Retic Fract: 23.7 % — ABNORMAL HIGH (ref 2.3–15.9)
RBC.: 3.46 MIL/uL — ABNORMAL LOW (ref 3.87–5.11)
Retic Count, Absolute: 106.9 10*3/uL (ref 19.0–186.0)
Retic Ct Pct: 3.1 % (ref 0.4–3.1)

## 2019-12-22 LAB — COMPREHENSIVE METABOLIC PANEL
ALT: 16 U/L (ref 0–44)
AST: 19 U/L (ref 15–41)
Albumin: 3.4 g/dL — ABNORMAL LOW (ref 3.5–5.0)
Alkaline Phosphatase: 40 U/L (ref 38–126)
Anion gap: 7 (ref 5–15)
BUN: 18 mg/dL (ref 8–23)
CO2: 27 mmol/L (ref 22–32)
Calcium: 8.7 mg/dL — ABNORMAL LOW (ref 8.9–10.3)
Chloride: 103 mmol/L (ref 98–111)
Creatinine, Ser: 0.64 mg/dL (ref 0.44–1.00)
GFR calc Af Amer: >60 mL/min (ref >60)
GFR calc non Af Amer: >60 mL/min (ref >60)
Glucose, Bld: 220 mg/dL — ABNORMAL HIGH (ref 70–99)
Potassium: 3.7 mmol/L (ref 3.5–5.1)
Sodium: 137 mmol/L (ref 135–145)
Total Bilirubin: 0.6 mg/dL (ref 0.3–1.2)
Total Protein: 6.9 g/dL (ref 6.5–8.1)

## 2019-12-22 LAB — LACTATE DEHYDROGENASE: LDH: 178 U/L (ref 98–192)

## 2019-12-22 LAB — DAT, POLYSPECIFIC AHG (ARMC ONLY): Polyspecific AHG test: NEGATIVE

## 2019-12-22 LAB — CK: Total CK: 66 U/L (ref 38–234)

## 2019-12-22 LAB — PATHOLOGIST SMEAR REVIEW

## 2019-12-22 MED ORDER — METHYLPREDNISOLONE SODIUM SUCC 125 MG IJ SOLR
40.0000 mg | INTRAMUSCULAR | Status: DC
  Administered 2019-12-22: 40 mg via INTRAVENOUS
  Filled 2019-12-22: qty 2

## 2019-12-22 MED ORDER — DIPHENHYDRAMINE HCL 25 MG PO CAPS
ORAL_CAPSULE | ORAL | Status: AC
  Filled 2019-12-22: qty 1

## 2019-12-22 MED ORDER — ACETAMINOPHEN 325 MG PO TABS
650.0000 mg | ORAL_TABLET | Freq: Once | ORAL | Status: AC
  Administered 2019-12-22: 10:00:00 650 mg via ORAL
  Filled 2019-12-22: qty 2

## 2019-12-22 MED ORDER — DIPHENHYDRAMINE HCL 25 MG PO CAPS
25.0000 mg | ORAL_CAPSULE | Freq: Once | ORAL | Status: AC
  Administered 2019-12-22: 10:00:00 25 mg via ORAL
  Filled 2019-12-22: qty 1

## 2019-12-22 MED ORDER — DEXTROSE 5 % IV SOLN
Freq: Once | INTRAVENOUS | Status: AC
  Filled 2019-12-22: qty 250

## 2019-12-22 MED ORDER — IMMUNE GLOBULIN (HUMAN) 20 GM/200ML IV SOLN
40.0000 g | INTRAVENOUS | Status: DC
  Administered 2019-12-22: 11:00:00 40 g via INTRAVENOUS
  Filled 2019-12-22: qty 400

## 2019-12-22 NOTE — Progress Notes (Signed)
No new changes noted today 

## 2019-12-22 NOTE — Patient Instructions (Signed)
Immune Globulin Injection What is this medicine? IMMUNE GLOBULIN (im MUNE GLOB yoo lin) helps to prevent or reduce the severity of certain infections in patients who are at risk. This medicine is collected from the pooled blood of many donors. It is used to treat immune system problems, thrombocytopenia, and Kawasaki syndrome. This medicine may be used for other purposes; ask your health care provider or pharmacist if you have questions. COMMON BRAND NAME(S): ASCENIV, Baygam, BIVIGAM, Carimune, Carimune NF, cutaquig, Cuvitru, Flebogamma, Flebogamma DIF, GamaSTAN, GamaSTAN S/D, Gamimune N, Gammagard, Gammagard S/D, Gammaked, Gammaplex, Gammar-P IV, Gamunex, Gamunex-C, Hizentra, Iveegam, Iveegam EN, Octagam, Panglobulin, Panglobulin NF, panzyga, Polygam S/D, Privigen, Sandoglobulin, Venoglobulin-S, Vigam, Vivaglobulin, Xembify What should I tell my health care provider before I take this medicine? They need to know if you have any of these conditions:   diabetes   extremely low or no immune antibodies in the blood   heart disease   history of blood clots   hyperprolinemia   infection in the blood, sepsis   kidney disease   taking medicine that may change kidney function - ask your health care provider about your medicine   an unusual or allergic reaction to human immune globulin, albumin, maltose, sucrose, polysorbate 80, other medicines, foods, dyes, or preservatives   pregnant or trying to get pregnant   breast-feeding How should I use this medicine? This medicine is for injection into a muscle or infusion into a vein or skin. It is usually given by a health care professional in a hospital or clinic setting. In rare cases, some brands of this medicine might be given at home. You will be taught how to give this medicine. Use exactly as directed. Take your medicine at regular intervals. Do not take your medicine more often than directed. Talk to your pediatrician regarding  the use of this medicine in children. Special care may be needed. Overdosage: If you think you have taken too much of this medicine contact a poison control center or emergency room at once. NOTE: This medicine is only for you. Do not share this medicine with others. What if I miss a dose? It is important not to miss your dose. Call your doctor or health care professional if you are unable to keep an appointment. If you give yourself the medicine and you miss a dose, take it as soon as you can. If it is almost time for your next dose, take only that dose. Do not take double or extra doses. What may interact with this medicine?  aspirin and aspirin-like medicines  cisplatin  cyclosporine  medicines for infection like acyclovir, adefovir, amphotericin B, bacitracin, cidofovir, foscarnet, ganciclovir, gentamicin, pentamidine, vancomycin  NSAIDS, medicines for pain and inflammation, like ibuprofen or naproxen  pamidronate  vaccines  zoledronic acid This list may not describe all possible interactions. Give your health care provider a list of all the medicines, herbs, non-prescription drugs, or dietary supplements you use. Also tell them if you smoke, drink alcohol, or use illegal drugs. Some items may interact with your medicine. What should I watch for while using this medicine? Your condition will be monitored carefully while you are receiving this medicine. This medicine is made from pooled blood donations of many different people. It may be possible to pass an infection in this medicine. However, the donors are screened for infections and all products are tested for HIV and hepatitis. The medicine is treated to kill most or all bacteria and viruses. Talk to your doctor about   the risks and benefits of this medicine. Do not have vaccinations for at least 14 days before, or until at least 3 months after receiving this medicine. What side effects may I notice from receiving this  medicine? Side effects that you should report to your doctor or health care professional as soon as possible:  allergic reactions like skin rash, itching or hives, swelling of the face, lips, or tongue  breathing problems  chest pain or tightness  fever, chills  headache with nausea, vomiting  neck pain or difficulty moving neck  pain when moving eyes  pain, swelling, warmth in the leg  problems with balance, talking, walking  sudden weight gain  swelling of the ankles, feet, hands  trouble passing urine or change in the amount of urine Side effects that usually do not require medical attention (report to your doctor or health care professional if they continue or are bothersome):  dizzy, drowsy  flushing  increased sweating  leg cramps  muscle aches and pains  pain at site where injected This list may not describe all possible side effects. Call your doctor for medical advice about side effects. You may report side effects to FDA at 1-800-FDA-1088. Where should I keep my medicine? Keep out of the reach of children. This drug is usually given in a hospital or clinic and will not be stored at home. In rare cases, some brands of this medicine may be given at home. If you are using this medicine at home, you will be instructed on how to store this medicine. Throw away any unused medicine after the expiration date on the label. NOTE: This sheet is a summary. It may not cover all possible information. If you have questions about this medicine, talk to your doctor, pharmacist, or health care provider.  2020 Elsevier/Gold Standard (2009-03-03 11:44:49)  

## 2019-12-23 ENCOUNTER — Ambulatory Visit: Payer: Medicare Other | Admitting: Hematology and Oncology

## 2019-12-23 ENCOUNTER — Inpatient Hospital Stay: Payer: Medicare Other

## 2019-12-23 ENCOUNTER — Other Ambulatory Visit: Payer: Medicare Other

## 2019-12-23 ENCOUNTER — Ambulatory Visit: Payer: Medicare Other

## 2019-12-23 VITALS — BP 158/81 | HR 86 | Temp 97.9°F | Resp 16

## 2019-12-23 DIAGNOSIS — M3320 Polymyositis, organ involvement unspecified: Secondary | ICD-10-CM | POA: Diagnosis not present

## 2019-12-23 LAB — ALDOLASE: Aldolase: 4.8 U/L (ref 3.3–10.3)

## 2019-12-23 MED ORDER — DIPHENHYDRAMINE HCL 25 MG PO TABS
25.0000 mg | ORAL_TABLET | Freq: Once | ORAL | Status: AC
  Administered 2019-12-23: 09:00:00 25 mg via ORAL
  Filled 2019-12-23: qty 1

## 2019-12-23 MED ORDER — DEXTROSE 5 % IV SOLN
Freq: Once | INTRAVENOUS | Status: AC
  Filled 2019-12-23: qty 250

## 2019-12-23 MED ORDER — ACETAMINOPHEN 325 MG PO TABS
650.0000 mg | ORAL_TABLET | Freq: Once | ORAL | Status: AC
  Administered 2019-12-23: 650 mg via ORAL

## 2019-12-23 MED ORDER — IMMUNE GLOBULIN (HUMAN) 20 GM/200ML IV SOLN
40.0000 g | INTRAVENOUS | Status: DC
  Administered 2019-12-23: 40 g via INTRAVENOUS
  Filled 2019-12-23: qty 400

## 2019-12-23 MED ORDER — METHYLPREDNISOLONE SODIUM SUCC 125 MG IJ SOLR
40.0000 mg | INTRAMUSCULAR | Status: DC
  Administered 2019-12-23: 09:00:00 40 mg via INTRAVENOUS

## 2019-12-24 ENCOUNTER — Inpatient Hospital Stay: Payer: Medicare Other

## 2019-12-24 ENCOUNTER — Other Ambulatory Visit: Payer: Self-pay

## 2019-12-24 VITALS — BP 159/81 | HR 81 | Temp 97.5°F | Resp 18

## 2019-12-24 DIAGNOSIS — M3320 Polymyositis, organ involvement unspecified: Secondary | ICD-10-CM | POA: Diagnosis not present

## 2019-12-24 MED ORDER — METHYLPREDNISOLONE SODIUM SUCC 125 MG IJ SOLR
40.0000 mg | INTRAMUSCULAR | Status: DC
  Administered 2019-12-24: 40 mg via INTRAVENOUS

## 2019-12-24 MED ORDER — ACETAMINOPHEN 325 MG PO TABS
650.0000 mg | ORAL_TABLET | Freq: Once | ORAL | Status: AC
  Administered 2019-12-24: 650 mg via ORAL

## 2019-12-24 MED ORDER — IMMUNE GLOBULIN (HUMAN) 20 GM/200ML IV SOLN
40.0000 g | INTRAVENOUS | Status: DC
  Administered 2019-12-24: 40 g via INTRAVENOUS
  Filled 2019-12-24: qty 400

## 2019-12-24 MED ORDER — DIPHENHYDRAMINE HCL 25 MG PO TABS
25.0000 mg | ORAL_TABLET | Freq: Once | ORAL | Status: AC
  Administered 2019-12-24: 25 mg via ORAL
  Filled 2019-12-24: qty 1

## 2019-12-24 MED ORDER — DEXTROSE 5 % IV SOLN
Freq: Once | INTRAVENOUS | Status: AC
  Filled 2019-12-24: qty 250

## 2019-12-25 ENCOUNTER — Inpatient Hospital Stay: Payer: Medicare Other

## 2019-12-25 VITALS — BP 167/77 | HR 76 | Temp 97.1°F | Resp 18

## 2019-12-25 DIAGNOSIS — M3320 Polymyositis, organ involvement unspecified: Secondary | ICD-10-CM | POA: Diagnosis not present

## 2019-12-25 MED ORDER — DIPHENHYDRAMINE HCL 25 MG PO CAPS
25.0000 mg | ORAL_CAPSULE | Freq: Once | ORAL | Status: AC
  Administered 2019-12-25: 25 mg via ORAL
  Filled 2019-12-25: qty 1

## 2019-12-25 MED ORDER — ACETAMINOPHEN 325 MG PO TABS
650.0000 mg | ORAL_TABLET | Freq: Once | ORAL | Status: AC
  Administered 2019-12-25: 650 mg via ORAL
  Filled 2019-12-25: qty 2

## 2019-12-25 MED ORDER — IMMUNE GLOBULIN (HUMAN) 20 GM/200ML IV SOLN
40.0000 g | INTRAVENOUS | Status: DC
  Administered 2019-12-25: 09:00:00 40 g via INTRAVENOUS
  Filled 2019-12-25: qty 400

## 2019-12-25 MED ORDER — METHYLPREDNISOLONE SODIUM SUCC 125 MG IJ SOLR
40.0000 mg | INTRAMUSCULAR | Status: DC
  Administered 2019-12-25: 09:00:00 40 mg via INTRAVENOUS
  Filled 2019-12-25: qty 2

## 2019-12-25 MED ORDER — DEXTROSE 5 % IV SOLN
Freq: Once | INTRAVENOUS | Status: AC
  Filled 2019-12-25: qty 250

## 2020-01-01 ENCOUNTER — Other Ambulatory Visit: Payer: Self-pay

## 2020-01-01 MED ORDER — NYSTATIN 100000 UNIT/GM EX POWD: Freq: Two times a day (BID) | CUTANEOUS | 2 refills | Status: DC

## 2020-01-01 NOTE — Telephone Encounter (Signed)
Okay to refill with #2 extra

## 2020-01-01 NOTE — Telephone Encounter (Signed)
Pt last used nystatin powder in 2019. Pt said now has redness with some irritation and on and off itching under both breast; pt request refill nystatin powder to [facility]. Pt last seen Catherine 10/22/19.

## 2020-01-07 ENCOUNTER — Other Ambulatory Visit: Payer: Self-pay

## 2020-01-07 MED ORDER — ACCU-CHEK SMARTVIEW VI STRP: ORAL_STRIP | 6 refills | Status: DC

## 2020-01-07 NOTE — Telephone Encounter (Signed)
Pt left v/m requesting refill test strips to [facility].pt request cb when done. Last Med wellness visit 10/22/19.

## 2020-01-07 NOTE — Telephone Encounter (Signed)
E-scribed refills.  Pt notified by phone.

## 2020-01-14 ENCOUNTER — Telehealth: Payer: Self-pay

## 2020-01-14 NOTE — Telephone Encounter (Signed)
Received notification from [facility] stating that Nystatin powder medication needs PA but patient received a temporary supply of this medication for now. Patient was advised if she is still having symptoms and needs more supply that we would need to try the Nystatin ointment form instead-per fax his should be covered without PA. (paper copy with Dr Alla German comments)  Patient understood and will let us know if she needs this any further

## 2020-01-16 ENCOUNTER — Telehealth: Payer: Self-pay

## 2020-01-16 MED ORDER — ACCU-CHEK AVIVA VI SOLN: 3 refills | Status: AC

## 2020-01-16 NOTE — Telephone Encounter (Signed)
Patient called and states that her Accu check machine is not working right and the pharmacist told her to get accu check solution to see if it would fix the machine. They asked for RX for solution to see if her insurance will pay but if not it is like $10 cash. RX sent in and patient will let us know if anything further is needed.

## 2020-01-17 NOTE — Progress Notes (Signed)
[facility]  [address] [place], [place] [address] Phone: [phone]  Fax: [phone]   Clinic Day:  01/19/2020  Referring physician: [name], MD  Chief Complaint: Bridget Romero is a 74 y.o. female with polymyositis who is seen for 1 month assessment and continuation of IVIG   HPI: The patient was last seen in the hematology clinic on 12/22/2019. At that time, she was doing well.  Lower extremity strength was 5/5 left and 5-/5 right.  CK was 66 (normal).  Adolase was 4.8 (normal).  LFTs were normal.  She received IVIG (x4 days) from 12/22/2019 to 12/25/2019.  Anemia work-up revealed a hematocrit was 32.4, hemoglobin 10.0, MCV 92.8, platelets 258,000, WBC 6200, ANC 3700. Retic was 3.8%. LDH was 178. Coombs was negative.  Peripheral smear revealed a normocytic anemia.  The morphology of the RBCs, WBCs and platelets were within normal limits.  She was scheduled to see Dr [name] before her next infusion.  She is scheduled for a 3D bilateral mammogram on 03/30/2020.  During the interim, she has felt good. She notes her eyes feel tight and watery with mild itching. The patients BP is 146/84 and pulse 103. She notes being in a hurry this morning and forgot to take her medication. She has an appointment with Dr. [name] this week.   Dr. [name] was contacted during clinic visit. Dr. [name] noted that if her labs were normal he would like for her treatment to be postponed for 2 weeks.    Past Medical History:  Diagnosis Date   Allergy    Asthma    Diabetes mellitus    Hyperlipidemia    Hypertension    Neuromuscular disorder ([place])    polymyositis   Osteoarthritis    Vitamin B12 deficiency     Past Surgical History:  Procedure Laterality Date   COLONOSCOPY WITH PROPOFOL N/A 03/07/2016   Procedure: COLONOSCOPY WITH PROPOFOL;  Surgeon: Lollie Sails, MD;  Location: [facility] ENDOSCOPY;  Service: Endoscopy;  Laterality: N/A;     Family History  Problem Relation Age of Onset   Heart disease Mother    Cancer Father     Social History:  reports that she has quit smoking. She has never used smokeless tobacco. She reports that she does not drink alcohol or use drugs. 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  Allergies:  Allergies  Allergen Reactions   Atorvastatin Other (See Comments)    myalgias   Pravastatin Other (See Comments)    Muscle pain    Current Medications: Current Outpatient Medications  Medication Sig Dispense Refill   ACCU-CHEK FASTCLIX LANCETS MISC Use to test blood sugar twice daily dx: 250.02 100 each 3   acetaminophen (TYLENOL) 500 MG tablet Take 500 mg by mouth every 6 (six) hours as needed.     Alcohol Swabs (B-D SINGLE USE SWABS REGULAR) PADS Use to test blood sugar once daily dx: 250.00 100 each 3   alendronate (FOSAMAX) 70 MG tablet TAKE 1 TABLET EVERY 7 DAYS WITH A FULL GLASS OF WATER.(DO NOT LIE DOWN FOR THE NEXT 30 MINUTES ) 12 tablet 3   Blood Glucose Calibration (ACCU-CHEK AVIVA) SOLN Use as directed 1 each 3   Cholecalciferol (VITAMIN D) 1000 UNITS capsule Take 1,000 Units by mouth daily.       folic acid (FOLVITE) 1 MG tablet Take 1 mg by mouth daily.     gabapentin (NEURONTIN) 300 MG  capsule Take 300 mg by mouth 2 (two) times daily.     glucose blood (ACCU-CHEK SMARTVIEW) test strip Use to check blood sugar twice a day Dx Code E11.4 100 each 6   insulin glargine (LANTUS) 100 UNIT/ML injection Inject 0.2 mLs (20 Units total) into the skin daily. (Patient taking differently: Inject 20 Units into the skin daily. Sliding Scale) 1 mL 0   lisinopril (PRINIVIL,ZESTRIL) 10 MG tablet TAKE 1 TABLET BY MOUTH EVERY DAY 90 tablet 1   metFORMIN (GLUCOPHAGE) 1000 MG tablet TAKE 1 TABLET BY MOUTH TWICE A DAY 180 tablet 3   methotrexate 50 MG/2ML injection Inject into the skin once a week.      Multiple Vitamin (MULTIVITAMIN) tablet Take 1 tablet  by mouth daily.       nystatin (MYCOSTATIN/NYSTOP) powder Apply topically 2 (two) times daily. 60 g 2   predniSONE (DELTASONE) 10 MG tablet Take 10 mg by mouth daily.     No current facility-administered medications for this visit.    Review of Systems  Constitutional: Positive for weight loss (2 pounds). Negative for chills, diaphoresis, fever and malaise/fatigue.       Feels "good".  HENT: Negative.  Negative for congestion, ear discharge, ear pain, hearing loss, nosebleeds, sinus pain and sore throat.   Eyes: Negative.  Negative for blurred vision, double vision and photophobia.       Eyes feel tight and watery with mild itching.  Respiratory: Negative.  Negative for cough, hemoptysis, sputum production and shortness of breath.   Cardiovascular: Negative.  Negative for chest pain, palpitations, orthopnea, claudication and leg swelling.  Gastrointestinal: Negative.  Negative for abdominal pain, blood in stool, constipation, diarrhea, heartburn, melena, nausea and vomiting.  Genitourinary: Negative.  Negative for dysuria, flank pain, frequency, hematuria and urgency.  Musculoskeletal: Negative.  Negative for back pain, falls, joint pain, myalgias and neck pain.  Skin: Negative.  Negative for itching and rash.  Neurological: Negative.  Negative for dizziness, tingling, tremors, sensory change, speech change, focal weakness, weakness and headaches.  Endo/Heme/Allergies: Negative.  Does not bruise/bleed easily.       Diabetes on insulin and Metformin.   Psychiatric/Behavioral: Negative.  Negative for depression and memory loss. The patient is not nervous/anxious and does not have insomnia.   All other systems reviewed and are negative.  Performance status (ECOG): 1  Vitals Blood pressure (!) 146/84, pulse (!) 103, temperature 98.1 F (36.7 C), temperature source Oral, resp. rate 18, weight 186 lb 1.1 oz (84.4 kg), SpO2 100 %.   Physical Exam  Constitutional: She is oriented to person,  place, and time. She appears well-developed and well-nourished. No distress. Face mask in place.  HENT:  Head: Normocephalic and atraumatic.  Mouth/Throat: No oropharyngeal exudate.  Brown cap. Short curly dark hair.  Mask.  Eyes: Pupils are equal, round, and reactive to light. Conjunctivae and EOM are normal. No scleral icterus.  Brown eyes.   Neck: No JVD present.  Cardiovascular: Normal rate, regular rhythm and normal heart sounds. Exam reveals no gallop and no friction rub.  No murmur heard. Pulmonary/Chest: Effort normal and breath sounds normal. No respiratory distress. She has no wheezes. She has no rales.  Abdominal: Soft. Bowel sounds are normal. She exhibits no distension and no mass. There is no abdominal tenderness. There is no rebound and no guarding.  Musculoskeletal:        General: No tenderness, deformity or edema. Normal range of motion.     Cervical back: Normal  range of motion and neck supple.     Comments: Gets up from chair with ease.  Lymphadenopathy:       Head (right side): No preauricular, no posterior auricular and no occipital adenopathy present.       Head (left side): No preauricular, no posterior auricular and no occipital adenopathy present.    She has no cervical adenopathy.    She has no axillary adenopathy.       Right: No inguinal and no supraclavicular adenopathy present.       Left: No inguinal and no supraclavicular adenopathy present.  Neurological: She is alert and oriented to person, place, and time. She has normal strength. She displays no atrophy and no tremor. No cranial nerve deficit or sensory deficit. She exhibits normal muscle tone. Coordination normal.  Lower extremity strength 5/5 left and 5/5 right.  Gait is normal.  Able to rise from a chair without using her arms.  Skin: Skin is warm and dry. No rash noted. She is not diaphoretic. No erythema. No pallor.  Psychiatric: She has a normal mood and affect. Her behavior is normal. Judgment and  thought content normal.  Nursing note and vitals reviewed.   Appointment on 01/19/2020  Component Date Value Ref Range Status   WBC 01/19/2020 8.8  4.0 - 10.5 K/uL Final   RBC 01/19/2020 4.10  3.87 - 5.11 MIL/uL Final   Hemoglobin 01/19/2020 11.8* 12.0 - 15.0 g/dL Final   HCT 01/19/2020 37.6  36.0 - 46.0 % Final   MCV 01/19/2020 91.7  80.0 - 100.0 fL Final   MCH 01/19/2020 28.8  26.0 - 34.0 pg Final   MCHC 01/19/2020 31.4  30.0 - 36.0 g/dL Final   RDW 01/19/2020 14.7  11.5 - 15.5 % Final   Platelets 01/19/2020 271  150 - 400 K/uL Final   nRBC 01/19/2020 0.0  0.0 - 0.2 % Final   Neutrophils Relative % 01/19/2020 65  % Final   Neutro Abs 01/19/2020 5.7  1.7 - 7.7 K/uL Final   Lymphocytes Relative 01/19/2020 28  % Final   Lymphs Abs 01/19/2020 2.4  0.7 - 4.0 K/uL Final   Monocytes Relative 01/19/2020 6  % Final   Monocytes Absolute 01/19/2020 0.5  0.1 - 1.0 K/uL Final   Eosinophils Relative 01/19/2020 1  % Final   Eosinophils Absolute 01/19/2020 0.1  0.0 - 0.5 K/uL Final   Basophils Relative 01/19/2020 0  % Final   Basophils Absolute 01/19/2020 0.0  0.0 - 0.1 K/uL Final   Immature Granulocytes 01/19/2020 0  % Final   Abs Immature Granulocytes 01/19/2020 0.03  0.00 - 0.07 K/uL Final   Performed at [facility], [address] [place], [place] [address]    Assessment:  NAKIYAH GUARDIAN is a 74 y.o. 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 She receives IVIG monthly,methotrexate, and prednisone10mg  a day.  CKhas been followed: >5555 on 05/30/2017, 4409 on 06/20/2017, 3546 on 07/05/2017, 2725 on 07/30/2017, 3675 on 08/28/2017, 2405 on 10/08/2017, 2743 on 11/08/2017, 2723 on 12/20/2017, 723 on 01/21/2018, 507 on 02/21/2018, 2192 on 04/11/2018, 2227 on 06/04/2018, 4468 on 07/22/2018, 2204 on 09/06/2018, 1360 on 12/02/2018, 1423 on 01/01/2019,912 on 03/03/2019, 3955 on 04/21/2019,4612 on 05/06/2019,4560 on 05/20/2019, 4532 on 06/16/2019,4935 on 07/10/2019,4330  on 07/28/2019, 3645 on 08/13/2019, 2514 on 08/25/2019, 807 on 09/22/2019, 251 on 10/21/2019, 67 on 11/25/2019, 66 on 12/22/2019, and < 5 on 01/19/2020.  Aldolasewas 67.2 in06/05/2017, 69.9 on06/27/2018, 64.8 on07/11/2017,57.6 on08/05/2017, 60.3 on09/03/2017, 38.7 on 12/20/2017, 9.3 on01/28/2019,  7.4 on02/28/2019, 29.1 on04/18/2019, to51.9 on07/29/2019, 23.7 on09/13/2019, 16.4 on 12/02/2018,14.1 on 01/01/2019,8.8 on 03/03/2019, 53.4 on 05/06/2019,42.9 on 05/20/2019, 39.0 in 06/16/2019, 69.3 on 07/28/2019, 37.2 on 08/25/2019, 12.5 on 09/22/2019, 7.2 on 10/21/2019, 5.2 on 11/25/2019, 4.8 on 12/22/2019, and 3.9 on 01/19/2020.  She received IVIG (Privigen) 75 gm x 2 days every 4 weeks beginning 12/07/2017.IVIG was decreased to every 8 weeks on 04/22/2019, butthen returned to monthly secondary to rise CK.IVIG wasincreased to 2 gm/kg on08/31/2020,09/23/2019, 10/21/2019, and 11/25/2019.  She has a normocytic anemia. Baseline hemoglobin is 10. Anemia work-up on 12/22/2019 revealed a hematocrit was 32.4, hemoglobin 10.0, MCV 92.8, platelets 258,000, WBC 6200, ANC 3700. Retic was 3.8%. LDH was 178. Coombs was negative.  Peripheral smear revealed a normocytic anemia.  The morphology of the RBCs, WBCs and platelets were within normal limits.  Last colonoscopy on 03/07/2016 revealed grade I hemorrhoids.She has B12 deficiencyand is on oral B12. B12 was 306 and folate >22.3 on 05/30/2017.   She has chronicmild elevation in LFTswhich have correlated with increased CPK.AST/ALThave been followed:53/54 on 02/17/2019, 107/128 on04/27/2020, 134/200 on05/26/2020,138/261 on 07/10/2019,151/304 on08/02/2019, 175/289 on 08/13/2019, 102/196 on 08/25/2019, 47/75 on 09/22/2019, 12/23 on 11/25/2019, 19/16 on 12/22/2019, and 20/16 on 01/19/2020. Hepatitis B and C testing werenegative on 06/27/2018and 07/28/2019.  Symptomatically, she is doing well.  Exam is normal.  Plan: 1.   Labs  today: CBC with diff, CMP, CK, aldolase. 2.   Polymyositis Clinically, shecontinues to do extremely well onIVIG to 2 gm/kg monthly. She is currently on prednisone10mg /day and MTX10mg SQweekly. CKis normal (< 5). Aldolaseis normal (3.9). Discussed today with Dr [name]- plan to switch IVIG to every 6 weeks.   No IVIG today.  RTC in 2 weeks for infusion. 3.Elevated LFTs, resolved  AST 20 and ALT 16. Increasedliver function testscorrelates withincreased CPK. CPK reflects activityofpolymyositis. Continue to monitor prior to each infusion. 4.Normocytic anemia Hematocrit 32.4.  Hemoglobin 10.0.  MCV 92.8 on 12/22/2019.  Hematocrit 37.6.  Hemoglobin 11.8.  MCV 91.7 on 01/19/2020. Ferritin 205 with an iron sat 40% and a TIBC 278.                         B12 716 and folate 35.0.                         Retic was 3.8%.                         Coombs negative and LDH 178. Continue to monitor. 5.  RTC in 2 weeks for labs (CBC with diff, CMP, CK, aldolase) and IVIG2 gm/kg(500 mg/kg per dosedailyx4). 6.   RTC in 8 weeks for MD assessment, labs (CBC with diff, CMP, CK, aldolase), and IVIG over 4 days.  I discussed the assessment and treatment plan with the patient.  The patient was provided an opportunity to ask questions and all were answered.  The patient agreed with the plan and demonstrated an understanding of the instructions.  The patient was advised to call back if the symptoms worsen or if the condition fails to improve as anticipated.   Lequita Asal, MD, PhD    01/19/2020, 9:48 AM  I, Selena Batten, am acting as a scribe for Lequita Asal, MD.  I, [place] Mike Gip, MD, have reviewed the above documentation for accuracy and completeness, and I agree with the above.

## 2020-01-19 ENCOUNTER — Inpatient Hospital Stay: Payer: Medicare PPO | Attending: Hematology and Oncology | Admitting: Hematology and Oncology

## 2020-01-19 ENCOUNTER — Encounter: Payer: Self-pay | Admitting: Hematology and Oncology

## 2020-01-19 ENCOUNTER — Inpatient Hospital Stay: Payer: Medicare PPO

## 2020-01-19 ENCOUNTER — Other Ambulatory Visit: Payer: Self-pay

## 2020-01-19 VITALS — BP 146/84 | HR 103 | Temp 98.1°F | Resp 18 | Wt 186.1 lb

## 2020-01-19 DIAGNOSIS — M3320 Polymyositis, organ involvement unspecified: Secondary | ICD-10-CM

## 2020-01-19 DIAGNOSIS — D649 Anemia, unspecified: Secondary | ICD-10-CM | POA: Diagnosis not present

## 2020-01-19 LAB — CBC WITH DIFFERENTIAL/PLATELET
Abs Immature Granulocytes: 0.03 10*3/uL (ref 0.00–0.07)
Basophils Absolute: 0 10*3/uL (ref 0.0–0.1)
Basophils Relative: 0 %
Eosinophils Absolute: 0.1 10*3/uL (ref 0.0–0.5)
Eosinophils Relative: 1 %
HCT: 37.6 % (ref 36.0–46.0)
Hemoglobin: 11.8 g/dL — ABNORMAL LOW (ref 12.0–15.0)
Immature Granulocytes: 0 %
Lymphocytes Relative: 28 %
Lymphs Abs: 2.4 10*3/uL (ref 0.7–4.0)
MCH: 28.8 pg (ref 26.0–34.0)
MCHC: 31.4 g/dL (ref 30.0–36.0)
MCV: 91.7 fL (ref 80.0–100.0)
Monocytes Absolute: 0.5 10*3/uL (ref 0.1–1.0)
Monocytes Relative: 6 %
Neutro Abs: 5.7 10*3/uL (ref 1.7–7.7)
Neutrophils Relative %: 65 %
Platelets: 271 10*3/uL (ref 150–400)
RBC: 4.1 MIL/uL (ref 3.87–5.11)
RDW: 14.7 % (ref 11.5–15.5)
WBC: 8.8 10*3/uL (ref 4.0–10.5)
nRBC: 0 % (ref 0.0–0.2)

## 2020-01-19 LAB — COMPREHENSIVE METABOLIC PANEL
ALT: 16 U/L (ref 0–44)
AST: 20 U/L (ref 15–41)
Albumin: 3.8 g/dL (ref 3.5–5.0)
Alkaline Phosphatase: 51 U/L (ref 38–126)
Anion gap: 10 (ref 5–15)
BUN: 13 mg/dL (ref 8–23)
CO2: 28 mmol/L (ref 22–32)
Calcium: 9.7 mg/dL (ref 8.9–10.3)
Chloride: 101 mmol/L (ref 98–111)
Creatinine, Ser: 0.73 mg/dL (ref 0.44–1.00)
GFR calc Af Amer: >60 mL/min (ref >60)
GFR calc non Af Amer: >60 mL/min (ref >60)
Glucose, Bld: 148 mg/dL — ABNORMAL HIGH (ref 70–99)
Potassium: 3.4 mmol/L — ABNORMAL LOW (ref 3.5–5.1)
Sodium: 139 mmol/L (ref 135–145)
Total Bilirubin: 0.6 mg/dL (ref 0.3–1.2)
Total Protein: 8.2 g/dL — ABNORMAL HIGH (ref 6.5–8.1)

## 2020-01-19 LAB — CK: Total CK: <5 U/L — ABNORMAL LOW (ref 38–234)

## 2020-01-19 NOTE — Progress Notes (Signed)
Patient here for follow up. Denies any concerns.  

## 2020-01-19 NOTE — Progress Notes (Signed)
CK Total is < 5. No IVIG today. Changing frequency to every 6 weeks. Patient will return to clinic in 2 weeks for labs and IVIG.

## 2020-01-20 ENCOUNTER — Inpatient Hospital Stay: Payer: Medicare PPO

## 2020-01-21 ENCOUNTER — Inpatient Hospital Stay: Payer: Medicare PPO

## 2020-01-21 LAB — ALDOLASE: Aldolase: 3.9 U/L (ref 3.3–10.3)

## 2020-01-22 ENCOUNTER — Inpatient Hospital Stay: Payer: Medicare PPO

## 2020-01-22 ENCOUNTER — Other Ambulatory Visit: Payer: Self-pay

## 2020-01-22 ENCOUNTER — Other Ambulatory Visit (INDEPENDENT_AMBULATORY_CARE_PROVIDER_SITE_OTHER): Payer: Medicare PPO

## 2020-01-22 DIAGNOSIS — E1165 Type 2 diabetes mellitus with hyperglycemia: Secondary | ICD-10-CM

## 2020-01-22 DIAGNOSIS — E1140 Type 2 diabetes mellitus with diabetic neuropathy, unspecified: Secondary | ICD-10-CM | POA: Diagnosis not present

## 2020-01-22 DIAGNOSIS — IMO0002 Reserved for concepts with insufficient information to code with codable children: Secondary | ICD-10-CM

## 2020-01-22 LAB — POCT GLYCOSYLATED HEMOGLOBIN (HGB A1C): Hemoglobin A1C: 8.9 % — AB (ref 4.0–5.6)

## 2020-02-02 ENCOUNTER — Inpatient Hospital Stay: Payer: Medicare PPO | Attending: Hematology and Oncology

## 2020-02-02 ENCOUNTER — Other Ambulatory Visit: Payer: Self-pay

## 2020-02-02 ENCOUNTER — Telehealth: Payer: Self-pay

## 2020-02-02 ENCOUNTER — Inpatient Hospital Stay: Payer: Medicare PPO

## 2020-02-02 VITALS — BP 148/78 | HR 76 | Temp 97.7°F | Resp 16 | Wt 186.0 lb

## 2020-02-02 DIAGNOSIS — M3320 Polymyositis, organ involvement unspecified: Secondary | ICD-10-CM | POA: Diagnosis not present

## 2020-02-02 DIAGNOSIS — D649 Anemia, unspecified: Secondary | ICD-10-CM

## 2020-02-02 LAB — CBC WITH DIFFERENTIAL/PLATELET
Abs Immature Granulocytes: 0.04 10*3/uL (ref 0.00–0.07)
Basophils Absolute: 0 10*3/uL (ref 0.0–0.1)
Basophils Relative: 0 %
Eosinophils Absolute: 0.1 10*3/uL (ref 0.0–0.5)
Eosinophils Relative: 1 %
HCT: 33.2 % — ABNORMAL LOW (ref 36.0–46.0)
Hemoglobin: 10.2 g/dL — ABNORMAL LOW (ref 12.0–15.0)
Immature Granulocytes: 1 %
Lymphocytes Relative: 27 %
Lymphs Abs: 2 10*3/uL (ref 0.7–4.0)
MCH: 28.5 pg (ref 26.0–34.0)
MCHC: 30.7 g/dL (ref 30.0–36.0)
MCV: 92.7 fL (ref 80.0–100.0)
Monocytes Absolute: 0.4 10*3/uL (ref 0.1–1.0)
Monocytes Relative: 6 %
Neutro Abs: 4.9 10*3/uL (ref 1.7–7.7)
Neutrophils Relative %: 65 %
Platelets: 240 10*3/uL (ref 150–400)
RBC: 3.58 MIL/uL — ABNORMAL LOW (ref 3.87–5.11)
RDW: 14.7 % (ref 11.5–15.5)
WBC: 7.4 10*3/uL (ref 4.0–10.5)
nRBC: 0 % (ref 0.0–0.2)

## 2020-02-02 LAB — COMPREHENSIVE METABOLIC PANEL
ALT: 15 U/L (ref 0–44)
AST: 19 U/L (ref 15–41)
Albumin: 3.5 g/dL (ref 3.5–5.0)
Alkaline Phosphatase: 42 U/L (ref 38–126)
Anion gap: 10 (ref 5–15)
BUN: 16 mg/dL (ref 8–23)
CO2: 26 mmol/L (ref 22–32)
Calcium: 9.1 mg/dL (ref 8.9–10.3)
Chloride: 103 mmol/L (ref 98–111)
Creatinine, Ser: 0.7 mg/dL (ref 0.44–1.00)
GFR calc Af Amer: >60 mL/min (ref >60)
GFR calc non Af Amer: >60 mL/min (ref >60)
Glucose, Bld: 197 mg/dL — ABNORMAL HIGH (ref 70–99)
Potassium: 3.7 mmol/L (ref 3.5–5.1)
Sodium: 139 mmol/L (ref 135–145)
Total Bilirubin: 0.7 mg/dL (ref 0.3–1.2)
Total Protein: 6.5 g/dL (ref 6.5–8.1)

## 2020-02-02 LAB — CK: Total CK: 67 U/L (ref 38–234)

## 2020-02-02 MED ORDER — METHYLPREDNISOLONE SODIUM SUCC 125 MG IJ SOLR
40.0000 mg | INTRAMUSCULAR | Status: DC
  Administered 2020-02-02: 10:00:00 40 mg via INTRAVENOUS

## 2020-02-02 MED ORDER — DIPHENHYDRAMINE HCL 25 MG PO TABS
25.0000 mg | ORAL_TABLET | Freq: Once | ORAL | Status: AC
  Administered 2020-02-02: 25 mg via ORAL
  Filled 2020-02-02: qty 1

## 2020-02-02 MED ORDER — ACETAMINOPHEN 325 MG PO TABS
650.0000 mg | ORAL_TABLET | Freq: Once | ORAL | Status: AC
  Administered 2020-02-02: 650 mg via ORAL

## 2020-02-02 MED ORDER — DEXTROSE 5 % IV SOLN
Freq: Once | INTRAVENOUS | Status: AC
  Filled 2020-02-02: qty 250

## 2020-02-02 MED ORDER — IMMUNE GLOBULIN (HUMAN) 20 GM/200ML IV SOLN
40.0000 g | INTRAVENOUS | Status: DC
  Administered 2020-02-02: 10:00:00 40 g via INTRAVENOUS
  Filled 2020-02-02: qty 400

## 2020-02-02 NOTE — Telephone Encounter (Signed)
Labs forwarded to Dr. [name] to review per Dr. Kem Parkinson request.

## 2020-02-02 NOTE — Telephone Encounter (Signed)
-----   Message from Lequita Asal, MD sent at 02/02/2020  4:32 PM EST ----- Regarding: Please send labs to Dr Precious Reel  ----- Message ----- From: Buel Ream, [facility] [place] Sent: 02/02/2020   9:29 AM EST To: Lequita Asal, MD

## 2020-02-03 ENCOUNTER — Inpatient Hospital Stay: Payer: Medicare PPO

## 2020-02-03 VITALS — BP 149/75 | HR 81 | Temp 97.5°F | Resp 18

## 2020-02-03 DIAGNOSIS — M3320 Polymyositis, organ involvement unspecified: Secondary | ICD-10-CM | POA: Diagnosis not present

## 2020-02-03 LAB — ALDOLASE: Aldolase: 4.2 U/L (ref 3.3–10.3)

## 2020-02-03 MED ORDER — DIPHENHYDRAMINE HCL 25 MG PO CAPS
25.0000 mg | ORAL_CAPSULE | Freq: Once | ORAL | Status: AC
  Administered 2020-02-03: 09:00:00 25 mg via ORAL
  Filled 2020-02-03: qty 1

## 2020-02-03 MED ORDER — DEXTROSE 5 % IV SOLN
Freq: Once | INTRAVENOUS | Status: AC
  Filled 2020-02-03: qty 250

## 2020-02-03 MED ORDER — ACETAMINOPHEN 325 MG PO TABS
650.0000 mg | ORAL_TABLET | Freq: Once | ORAL | Status: AC
  Administered 2020-02-03: 09:00:00 650 mg via ORAL

## 2020-02-03 MED ORDER — METHYLPREDNISOLONE SODIUM SUCC 125 MG IJ SOLR
40.0000 mg | INTRAMUSCULAR | Status: DC
  Administered 2020-02-03: 40 mg via INTRAVENOUS

## 2020-02-03 MED ORDER — IMMUNE GLOBULIN (HUMAN) 20 GM/200ML IV SOLN
500.0000 mg/kg | INTRAVENOUS | Status: DC
  Administered 2020-02-03: 40 g via INTRAVENOUS
  Filled 2020-02-03: qty 400

## 2020-02-03 NOTE — Patient Instructions (Signed)
Immune Globulin Injection What is this medicine? IMMUNE GLOBULIN (im MUNE GLOB yoo lin) helps to prevent or reduce the severity of certain infections in patients who are at risk. This medicine is collected from the pooled blood of many donors. It is used to treat immune system problems, thrombocytopenia, and Kawasaki syndrome. This medicine may be used for other purposes; ask your health care provider or pharmacist if you have questions. COMMON BRAND NAME(S): ASCENIV, Baygam, BIVIGAM, Carimune, Carimune NF, cutaquig, Cuvitru, Flebogamma, Flebogamma DIF, GamaSTAN, GamaSTAN S/D, Gamimune N, Gammagard, Gammagard S/D, Gammaked, Gammaplex, Gammar-P IV, Gamunex, Gamunex-C, Hizentra, Iveegam, Iveegam EN, Octagam, Panglobulin, Panglobulin NF, panzyga, Polygam S/D, Privigen, Sandoglobulin, Venoglobulin-S, Vigam, Vivaglobulin, Xembify What should I tell my health care provider before I take this medicine? They need to know if you have any of these conditions:  diabetes  extremely low or no immune antibodies in the blood  heart disease  history of blood clots  hyperprolinemia  infection in the blood, sepsis  kidney disease  recently received or scheduled to receive a vaccination  an unusual or allergic reaction to human immune globulin, albumin, maltose, sucrose, other medicines, foods, dyes, or preservatives  pregnant or trying to get pregnant  breast-feeding How should I use this medicine? This medicine is for injection into a muscle or infusion into a vein or skin. It is usually given by a health care professional in a hospital or clinic setting. In rare cases, some brands of this medicine might be given at home. You will be taught how to give this medicine. Use exactly as directed. Take your medicine at regular intervals. Do not take your medicine more often than directed. Talk to your pediatrician regarding the use of this medicine in children. While this drug may be prescribed for selected  conditions, precautions do apply. Overdosage: If you think you have taken too much of this medicine contact a poison control center or emergency room at once. NOTE: This medicine is only for you. Do not share this medicine with others. What if I miss a dose? It is important not to miss your dose. Call your doctor or health care professional if you are unable to keep an appointment. If you give yourself the medicine and you miss a dose, take it as soon as you can. If it is almost time for your next dose, take only that dose. Do not take double or extra doses. What may interact with this medicine?  aspirin and aspirin-like medicines  cisplatin  cyclosporine  medicines for infection like acyclovir, adefovir, amphotericin B, bacitracin, cidofovir, foscarnet, ganciclovir, gentamicin, pentamidine, vancomycin  NSAIDS, medicines for pain and inflammation, like ibuprofen or naproxen  pamidronate  vaccines  zoledronic acid This list may not describe all possible interactions. Give your health care provider a list of all the medicines, herbs, non-prescription drugs, or dietary supplements you use. Also tell them if you smoke, drink alcohol, or use illegal drugs. Some items may interact with your medicine. What should I watch for while using this medicine? Your condition will be monitored carefully while you are receiving this medicine. This medicine is made from pooled blood donations of many different people. It may be possible to pass an infection in this medicine. However, the donors are screened for infections and all products are tested for HIV and hepatitis. The medicine is treated to kill most or all bacteria and viruses. Talk to your doctor about the risks and benefits of this medicine. Do not have vaccinations for at least   14 days before, or until at least 3 months after receiving this medicine. What side effects may I notice from receiving this medicine? Side effects that you should report  to your doctor or health care professional as soon as possible:  allergic reactions like skin rash, itching or hives, swelling of the face, lips, or tongue  blue colored lips or skin  breathing problems  chest pain or tightness  fever  signs and symptoms of aseptic meningitis such as stiff neck; sensitivity to light; headache; drowsiness; fever; nausea; vomiting; rash  signs and symptoms of a blood clot such as chest pain; shortness of breath; pain, swelling, or warmth in the leg  signs and symptoms of hemolytic anemia such as fast heartbeat; tiredness; dark yellow or brown urine; or yellowing of the eyes or skin  signs and symptoms of kidney injury like trouble passing urine or change in the amount of urine  sudden weight gain  swelling of the ankles, feet, hands Side effects that usually do not require medical attention (report to your doctor or health care professional if they continue or are bothersome):  diarrhea  flushing  headache  increased sweating  joint pain  muscle cramps  muscle pain  nausea  pain, redness, or irritation at site where injected  tiredness This list may not describe all possible side effects. Call your doctor for medical advice about side effects. You may report side effects to FDA at 1-800-FDA-1088. Where should I keep my medicine? Keep out of the reach of children. This drug is usually given in a hospital or clinic and will not be stored at home. In rare cases, some brands of this medicine may be given at home. If you are using this medicine at home, you will be instructed on how to store this medicine. Throw away any unused medicine after the expiration date on the label. NOTE: This sheet is a summary. It may not cover all possible information. If you have questions about this medicine, talk to your doctor, pharmacist, or health care provider.  2020 Elsevier/Gold Standard (2019-07-16 12:51:14)  

## 2020-02-04 ENCOUNTER — Inpatient Hospital Stay: Payer: Medicare PPO

## 2020-02-04 ENCOUNTER — Other Ambulatory Visit: Payer: Self-pay

## 2020-02-04 ENCOUNTER — Other Ambulatory Visit: Payer: Self-pay | Admitting: Internal Medicine

## 2020-02-04 VITALS — BP 160/90 | HR 80 | Temp 97.6°F | Resp 17

## 2020-02-04 DIAGNOSIS — M3320 Polymyositis, organ involvement unspecified: Secondary | ICD-10-CM | POA: Diagnosis not present

## 2020-02-04 MED ORDER — DIPHENHYDRAMINE HCL 25 MG PO TABS
25.0000 mg | ORAL_TABLET | Freq: Once | ORAL | Status: AC
  Administered 2020-02-04: 25 mg via ORAL
  Filled 2020-02-04: qty 1

## 2020-02-04 MED ORDER — ACETAMINOPHEN 325 MG PO TABS
650.0000 mg | ORAL_TABLET | Freq: Once | ORAL | Status: AC
  Administered 2020-02-04: 650 mg via ORAL

## 2020-02-04 MED ORDER — DEXTROSE 5 % IV SOLN
Freq: Once | INTRAVENOUS | Status: AC
  Filled 2020-02-04: qty 250

## 2020-02-04 MED ORDER — IMMUNE GLOBULIN (HUMAN) 20 GM/200ML IV SOLN
500.0000 mg/kg | INTRAVENOUS | Status: DC
  Administered 2020-02-04: 40 g via INTRAVENOUS
  Filled 2020-02-04: qty 400

## 2020-02-04 MED ORDER — METHYLPREDNISOLONE SODIUM SUCC 125 MG IJ SOLR
40.0000 mg | INTRAMUSCULAR | Status: DC
  Administered 2020-02-04: 40 mg via INTRAVENOUS

## 2020-02-05 ENCOUNTER — Inpatient Hospital Stay: Payer: Medicare PPO

## 2020-02-05 VITALS — BP 160/90 | HR 80 | Temp 97.5°F | Resp 18

## 2020-02-05 DIAGNOSIS — M3320 Polymyositis, organ involvement unspecified: Secondary | ICD-10-CM

## 2020-02-05 MED ORDER — DIPHENHYDRAMINE HCL 25 MG PO TABS
25.0000 mg | ORAL_TABLET | Freq: Once | ORAL | Status: AC
  Administered 2020-02-05: 08:00:00 25 mg via ORAL
  Filled 2020-02-05: qty 1

## 2020-02-05 MED ORDER — ACETAMINOPHEN 325 MG PO TABS
650.0000 mg | ORAL_TABLET | Freq: Once | ORAL | Status: AC
  Administered 2020-02-05: 650 mg via ORAL

## 2020-02-05 MED ORDER — DEXTROSE 5 % IV SOLN
Freq: Once | INTRAVENOUS | Status: AC
  Filled 2020-02-05: qty 250

## 2020-02-05 MED ORDER — METHYLPREDNISOLONE SODIUM SUCC 125 MG IJ SOLR
40.0000 mg | INTRAMUSCULAR | Status: DC
  Administered 2020-02-05: 08:00:00 40 mg via INTRAVENOUS

## 2020-02-05 MED ORDER — IMMUNE GLOBULIN (HUMAN) 20 GM/200ML IV SOLN
500.0000 mg/kg | INTRAVENOUS | Status: DC
  Administered 2020-02-05: 40 g via INTRAVENOUS
  Filled 2020-02-05: qty 400

## 2020-03-02 DIAGNOSIS — E119 Type 2 diabetes mellitus without complications: Secondary | ICD-10-CM | POA: Diagnosis not present

## 2020-03-02 DIAGNOSIS — R21 Rash and other nonspecific skin eruption: Secondary | ICD-10-CM | POA: Diagnosis not present

## 2020-03-02 DIAGNOSIS — M3322 Polymyositis with myopathy: Secondary | ICD-10-CM | POA: Diagnosis not present

## 2020-03-02 DIAGNOSIS — G629 Polyneuropathy, unspecified: Secondary | ICD-10-CM | POA: Diagnosis not present

## 2020-03-03 ENCOUNTER — Inpatient Hospital Stay: Payer: Medicare PPO

## 2020-03-04 ENCOUNTER — Inpatient Hospital Stay: Payer: Medicare PPO

## 2020-03-13 NOTE — Progress Notes (Incomplete)
?  [facility]  ?[address] ?[place], [place] [address] ?Phone: [phone] ? Fax: [phone] ? ? ?Clinic Day:  03/13/2020 ? ?Referring physician: [name], MD ? ?Chief Complaint: Bridget Romero is a 74 y.o. female with polymyositis who is seen for assessment continuation of IVIG. ? ?HPI: The patient was last seen in the hematology clinic on 01/19/2020. At that time, she was doing well. Exam was normal. Hematocrit 37.6, hemoglobin 11.8, platelets 271,000, WBC 8,800. Potassium 3.4. Total protein 8.2. Aldolase 3.9. CK <5.  ? ?Patient was given IVIG x 4 (02/02/2020 - 02/05/2020).  ? ?Labs on 02/02/2020: Hematocrit 33.2, hemoglobin 10.2, platelets 240,000, WBC 7,400. CMP was normal. Total CK 67. Aldolase 4.2.  ? ?Patient was seen by Dr. [name] on 03/02/2020. (no access to note).  ? ?During the interim, *** ? ?Past Medical History:  ?Diagnosis Date  ?? Allergy   ?? Asthma   ?? Diabetes mellitus   ?? Hyperlipidemia   ?? Hypertension   ?? Neuromuscular disorder ([place])   ? polymyositis  ?? Osteoarthritis   ?? Vitamin B12 deficiency   ? ? ?Past Surgical History:  ?Procedure Laterality Date  ?? COLONOSCOPY WITH PROPOFOL N/A 03/07/2016  ? Procedure: COLONOSCOPY WITH PROPOFOL;  Surgeon: Lollie Sails, MD;  Location: [facility] ENDOSCOPY;  Service: Endoscopy;  Laterality: N/A;  ? ? ?Family History  ?Problem Relation Age of Onset  ?? Heart disease Mother   ?? Cancer Father   ? ? ?Social History:  reports that she has quit smoking. She has never used smokeless tobacco. She reports that she does not drink alcohol or use drugs. 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 The patient is ***alone/accompanied by *** today. ? ?Allergies:  ?Allergies  ?Allergen Reactions  ?? Atorvastatin Other (See Comments)  ?  myalgias  ?? Pravastatin Other (See Comments)  ?  Muscle pain  ? ? ?Current Medications: ?Current Outpatient Medications  ?Medication Sig Dispense Refill  ? ? ACCU-CHEK FASTCLIX LANCETS MISC Use to test blood sugar twice daily dx: 250.02

## 2020-03-15 ENCOUNTER — Inpatient Hospital Stay: Payer: Medicare PPO

## 2020-03-15 ENCOUNTER — Telehealth: Payer: Self-pay

## 2020-03-15 ENCOUNTER — Other Ambulatory Visit: Payer: Self-pay | Admitting: Internal Medicine

## 2020-03-15 ENCOUNTER — Inpatient Hospital Stay: Payer: Medicare PPO | Admitting: Hematology and Oncology

## 2020-03-15 ENCOUNTER — Other Ambulatory Visit: Payer: Self-pay

## 2020-03-15 ENCOUNTER — Other Ambulatory Visit
Admission: RE | Admit: 2020-03-15 | Discharge: 2020-03-15 | Disposition: A | Discharge status: Home / Self Care | Payer: Medicare PPO | Attending: Hematology and Oncology | Admitting: Hematology and Oncology

## 2020-03-15 VITALS — BP 158/79 | HR 71 | Temp 96.9°F | Resp 16 | Wt 195.1 lb

## 2020-03-15 DIAGNOSIS — D649 Anemia, unspecified: Secondary | ICD-10-CM | POA: Insufficient documentation

## 2020-03-15 DIAGNOSIS — M3320 Polymyositis, organ involvement unspecified: Secondary | ICD-10-CM | POA: Diagnosis not present

## 2020-03-15 LAB — CBC WITH DIFFERENTIAL/PLATELET
Abs Immature Granulocytes: 0.03 10*3/uL (ref 0.00–0.07)
Basophils Absolute: 0 10*3/uL (ref 0.0–0.1)
Basophils Relative: 1 %
Eosinophils Absolute: 0.1 10*3/uL (ref 0.0–0.5)
Eosinophils Relative: 1 %
HCT: 32.3 % — ABNORMAL LOW (ref 36.0–46.0)
Hemoglobin: 10 g/dL — ABNORMAL LOW (ref 12.0–15.0)
Immature Granulocytes: 1 %
Lymphocytes Relative: 23 %
Lymphs Abs: 1.4 10*3/uL (ref 0.7–4.0)
MCH: 29.1 pg (ref 26.0–34.0)
MCHC: 31 g/dL (ref 30.0–36.0)
MCV: 93.9 fL (ref 80.0–100.0)
Monocytes Absolute: 0.4 10*3/uL (ref 0.1–1.0)
Monocytes Relative: 6 %
Neutro Abs: 4.1 10*3/uL (ref 1.7–7.7)
Neutrophils Relative %: 68 %
Platelets: 248 10*3/uL (ref 150–400)
RBC: 3.44 MIL/uL — ABNORMAL LOW (ref 3.87–5.11)
RDW: 15 % (ref 11.5–15.5)
WBC: 5.9 10*3/uL (ref 4.0–10.5)
nRBC: 0 % (ref 0.0–0.2)

## 2020-03-15 LAB — COMPREHENSIVE METABOLIC PANEL
ALT: 17 U/L (ref 0–44)
AST: 22 U/L (ref 15–41)
Albumin: 3.4 g/dL — ABNORMAL LOW (ref 3.5–5.0)
Alkaline Phosphatase: 44 U/L (ref 38–126)
Anion gap: 7 (ref 5–15)
BUN: 17 mg/dL (ref 8–23)
CO2: 27 mmol/L (ref 22–32)
Calcium: 9.1 mg/dL (ref 8.9–10.3)
Chloride: 104 mmol/L (ref 98–111)
Creatinine, Ser: 0.64 mg/dL (ref 0.44–1.00)
GFR calc Af Amer: >60 mL/min (ref >60)
GFR calc non Af Amer: >60 mL/min (ref >60)
Glucose, Bld: 177 mg/dL — ABNORMAL HIGH (ref 70–99)
Potassium: 3.8 mmol/L (ref 3.5–5.1)
Sodium: 138 mmol/L (ref 135–145)
Total Bilirubin: 0.7 mg/dL (ref 0.3–1.2)
Total Protein: 6.4 g/dL — ABNORMAL LOW (ref 6.5–8.1)

## 2020-03-15 LAB — CK: Total CK: 254 U/L — ABNORMAL HIGH (ref 38–234)

## 2020-03-15 NOTE — Progress Notes (Unsigned)
Patient here for follow up. Patient has some question regarding Gabapentin. Denies any other concerns.

## 2020-03-15 NOTE — Progress Notes (Deleted)
[facility]  [address] [place], [place] [address] Phone: [phone]  Fax: [phone]   Clinic Day:  03/15/2020  Referring physician: [name], MD  Chief Complaint: Bridget Romero is a 74 y.o. female with polymyositis who is seen for assessment prior to every 2 month IVIG.  HPI:  The patient was last seen in the hematology clinic on 01/19/2020. At that time, she was doing well. Exam was normal. Hematocrit was 37.6, hemoglobin 11.8, platelets 271,000, WBC 8,800.  LFTs were normal.  Aldolase 3.9. CK <5.  As she was doing well, decision was made to switch her IVIG to every 6 weeks (last received 12/22/2019).   She received IVIG x 4 (02/02/2020 - 02/05/2020).   Labs on 02/02/2020: Hematocrit 33.2, hemoglobin 10.2, platelets 240,000, WBC 7,400. LFTs were normal.  Aldolase 4.2. CK 67.  Patient was seen by Dr. [name] on 03/02/2020.  She was doing well.  CBC revealed a hematocrit 33.7, hemoglobin 10.3, MCV 94.4, platelets 288,000, WBC 6400 (ANC 5410).  LFTs were normal.  Aldolase 4.8.  CK 184.  Plan was to lower her prednisone to 5 mg and spread out her IVIG to every 2 months if labs were stable.  Next infusion would be 03/29/2020.  During the interim, ***   Past Medical History:  Diagnosis Date  . Allergy   . Asthma   . Diabetes mellitus   . Hyperlipidemia   . Hypertension   . Neuromuscular disorder (HCC)    polymyositis  . Osteoarthritis   . Vitamin B12 deficiency     Past Surgical History:  Procedure Laterality Date  . COLONOSCOPY WITH PROPOFOL N/A 03/07/2016   Procedure: COLONOSCOPY WITH PROPOFOL;  Surgeon: Lollie Sails, MD;  Location: [facility] ENDOSCOPY;  Service: Endoscopy;  Laterality: N/A;    Family History  Problem Relation Age of Onset  . Heart disease Mother   . Cancer Father     Social History:  reports that she has quit smoking. She has never used smokeless tobacco. She reports that she does not drink alcohol  or use drugs. She is a Training and development officer. She lives in [place]. The patient is ***alone/accompanied by *** today.  Allergies:  Allergies  Allergen Reactions  . Atorvastatin Other (See Comments)    myalgias  . Pravastatin Other (See Comments)    Muscle pain    Current Medications: Current Outpatient Medications  Medication Sig Dispense Refill  . ACCU-CHEK FASTCLIX LANCETS MISC Use to test blood sugar twice daily dx: 250.02 100 each 3  . acetaminophen (TYLENOL) 500 MG tablet Take 500 mg by mouth every 6 (six) hours as needed.    . Alcohol Swabs (B-D SINGLE USE SWABS REGULAR) PADS Use to test blood sugar once daily dx: 250.00 100 each 3  . alendronate (FOSAMAX) 70 MG tablet TAKE 1 TABLET EVERY 7 DAYS WITH A FULL GLASS OF WATER.(DO NOT LIE DOWN FOR THE NEXT 30 MINUTES ) 12 tablet 3  . atorvastatin (LIPITOR) 10 MG tablet TAKE 1 TABLET BY MOUTH ONCE DAILY 90 tablet 3  . Blood Glucose Calibration (ACCU-CHEK AVIVA) SOLN Use as directed 1 each 3  . Cholecalciferol (VITAMIN D) 1000 UNITS capsule Take 1,000 Units by mouth daily.      . folic acid (FOLVITE) 1 MG tablet Take 1 mg by mouth daily.    Marland Kitchen gabapentin (NEURONTIN) 300 MG capsule Take 300 mg by mouth 3 (three) times daily.     Marland Kitchen glucose blood (ACCU-CHEK SMARTVIEW) test  strip Use to check blood sugar twice a day Dx Code E11.4 100 each 6  . insulin glargine (LANTUS) 100 UNIT/ML injection Inject 0.2 mLs (20 Units total) into the skin daily. (Patient taking differently: Inject 20 Units into the skin daily. Sliding Scale) 1 mL 0  . lisinopril (PRINIVIL,ZESTRIL) 10 MG tablet TAKE 1 TABLET BY MOUTH EVERY DAY 90 tablet 1  . metFORMIN (GLUCOPHAGE) 1000 MG tablet TAKE 1 TABLET BY MOUTH TWICE A DAY 180 tablet 3  . methotrexate 50 MG/2ML injection Inject into the skin once a week.     . Multiple Vitamin (MULTIVITAMIN) tablet Take 1 tablet by mouth daily.      . predniSONE (DELTASONE) 10 MG tablet Take 5 mg by mouth daily.      No current facility-administered  medications for this visit.    Review of Systems  Constitutional: Positive for weight loss (2 pounds). Negative for chills, diaphoresis, fever and malaise/fatigue.       Feels "good".  HENT: Negative.  Negative for congestion, ear discharge, ear pain, hearing loss, nosebleeds, sinus pain and sore throat.   Eyes: Negative.  Negative for blurred vision, double vision and photophobia.       Eyes feel tight and watery with mild itching.  Respiratory: Negative.  Negative for cough, hemoptysis, sputum production and shortness of breath.   Cardiovascular: Negative.  Negative for chest pain, palpitations, orthopnea, claudication and leg swelling.  Gastrointestinal: Negative.  Negative for abdominal pain, blood in stool, constipation, diarrhea, heartburn, melena, nausea and vomiting.  Genitourinary: Negative.  Negative for dysuria, flank pain, frequency, hematuria and urgency.  Musculoskeletal: Negative.  Negative for back pain, falls, joint pain, myalgias and neck pain.  Skin: Negative.  Negative for itching and rash.  Neurological: Negative.  Negative for dizziness, tingling, tremors, sensory change, speech change, focal weakness, weakness and headaches.  Endo/Heme/Allergies: Negative.  Does not bruise/bleed easily.       Diabetes on insulin and Metformin.   Psychiatric/Behavioral: Negative.  Negative for depression and memory loss. The patient is not nervous/anxious and does not have insomnia.   All other systems reviewed and are negative.   Performance status (ECOG): 1  Vitals There were no vitals taken for this visit.   Physical Exam  Constitutional: She is oriented to person, place, and time. She appears well-developed and well-nourished. No distress. Face mask in place.  HENT:  Head: Normocephalic and atraumatic.  Mouth/Throat: No oropharyngeal exudate.  Brown cap. Short curly dark hair.  Mask.  Eyes: Pupils are equal, round, and reactive to light. Conjunctivae and EOM are normal. No  scleral icterus.  Brown eyes.   Neck: No JVD present.  Cardiovascular: Normal rate, regular rhythm and normal heart sounds. Exam reveals no gallop and no friction rub.  No murmur heard. Pulmonary/Chest: Effort normal and breath sounds normal. No respiratory distress. She has no wheezes. She has no rales.  Abdominal: Soft. Bowel sounds are normal. She exhibits no distension and no mass. There is no abdominal tenderness. There is no rebound and no guarding.  Musculoskeletal:        General: No tenderness, deformity or edema. Normal range of motion.     Cervical back: Normal range of motion and neck supple.     Comments: Gets up from chair with ease.  Lymphadenopathy:       Head (right side): No preauricular, no posterior auricular and no occipital adenopathy present.       Head (left side): No preauricular, no posterior  auricular and no occipital adenopathy present.    She has no cervical adenopathy.    She has no axillary adenopathy.       Right: No supraclavicular adenopathy present.       Left: No supraclavicular adenopathy present.  Neurological: She is alert and oriented to person, place, and time. She has normal strength. She displays no atrophy and no tremor. No cranial nerve deficit or sensory deficit. She exhibits normal muscle tone. Coordination normal.  Lower extremity strength 5/5 left and 5/5 right.  Gait is normal.  Able to rise from a chair without using her arms.  Skin: Skin is warm and dry. No rash noted. She is not diaphoretic. No erythema. No pallor.  Psychiatric: She has a normal mood and affect. Her behavior is normal. Judgment and thought content normal.  Nursing note and vitals reviewed.    No visits with results within 3 Day(s) from this visit.  Latest known visit with results is:  Appointment on 03/15/2020  Component Date Value Ref Range Status  . Total CK 03/15/2020 254* 38 - 234 U/L Final   Performed at [facility], [address],  [place], [place] [address]  . Sodium 03/15/2020 138  135 - 145 mmol/L Final  . Potassium 03/15/2020 3.8  3.5 - 5.1 mmol/L Final  . Chloride 03/15/2020 104  98 - 111 mmol/L Final  . CO2 03/15/2020 27  22 - 32 mmol/L Final  . Glucose, Bld 03/15/2020 177* 70 - 99 mg/dL Final   Glucose reference range applies only to samples taken after fasting for at least 8 hours.  . BUN 03/15/2020 17  8 - 23 mg/dL Final  . Creatinine, Ser 03/15/2020 0.64  0.44 - 1.00 mg/dL Final  . Calcium 03/15/2020 9.1  8.9 - 10.3 mg/dL Final  . Total Protein 03/15/2020 6.4* 6.5 - 8.1 g/dL Final  . Albumin 03/15/2020 3.4* 3.5 - 5.0 g/dL Final  . AST 03/15/2020 22  15 - 41 U/L Final  . ALT 03/15/2020 17  0 - 44 U/L Final  . Alkaline Phosphatase 03/15/2020 44  38 - 126 U/L Final  . Total Bilirubin 03/15/2020 0.7  0.3 - 1.2 mg/dL Final  . GFR calc non Af Amer 03/15/2020 >60  >60 mL/min Final  . GFR calc Af Amer 03/15/2020 >60  >60 mL/min Final  . Anion gap 03/15/2020 7  5 - 15 Final   Performed at [facility], [address], [place], [place] [address]  . WBC 03/15/2020 5.9  4.0 - 10.5 K/uL Final  . RBC 03/15/2020 3.44* 3.87 - 5.11 MIL/uL Final  . Hemoglobin 03/15/2020 10.0* 12.0 - 15.0 g/dL Final  . HCT 03/15/2020 32.3* 36.0 - 46.0 % Final  . MCV 03/15/2020 93.9  80.0 - 100.0 fL Final  . MCH 03/15/2020 29.1  26.0 - 34.0 pg Final  . MCHC 03/15/2020 31.0  30.0 - 36.0 g/dL Final  . RDW 03/15/2020 15.0  11.5 - 15.5 % Final  . Platelets 03/15/2020 248  150 - 400 K/uL Final  . nRBC 03/15/2020 0.0  0.0 - 0.2 % Final  . Neutrophils Relative % 03/15/2020 68  % Final  . Neutro Abs 03/15/2020 4.1  1.7 - 7.7 K/uL Final  . Lymphocytes Relative 03/15/2020 23  % Final  . Lymphs Abs 03/15/2020 1.4  0.7 - 4.0 K/uL Final  . Monocytes Relative 03/15/2020 6  % Final  . Monocytes Absolute 03/15/2020 0.4  0.1 - 1.0 K/uL Final  .  Eosinophils Relative 03/15/2020 1  % Final  . Eosinophils Absolute 03/15/2020 0.1  0.0 - 0.5 K/uL Final    . Basophils Relative 03/15/2020 1  % Final  . Basophils Absolute 03/15/2020 0.0  0.0 - 0.1 K/uL Final  . Immature Granulocytes 03/15/2020 1  % Final  . Abs Immature Granulocytes 03/15/2020 0.03  0.00 - 0.07 K/uL Final   Performed at [facility], [address] [place], [place] [address]    Assessment:  Bridget Romero is a 74 y.o. 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 She receives IVIG monthly,methotrexate, and prednisone10mg  a day.  CKhas been followed: >5555 on 05/30/2017, 4409 on 06/20/2017, 3546 on 07/05/2017, 2725 on 07/30/2017, 3675 on 08/28/2017, 2405 on 10/08/2017, 2743 on 11/08/2017, 2723 on 12/20/2017, 723 on 01/21/2018, 507 on 02/21/2018, 2192 on 04/11/2018, 2227 on 06/04/2018, 4468 on 07/22/2018, 2204 on 09/06/2018, 1360 on 12/02/2018, 1423 on 01/01/2019,912 on 03/03/2019, 3955 on 04/21/2019,4612 on 05/06/2019,4560 on 05/20/2019, 4532 on 06/16/2019,4935 on 07/10/2019,4330 on 07/28/2019, 3645 on 08/13/2019, 2514 on 08/25/2019, 807 on 09/22/2019, 251 on 10/21/2019, 67 on 11/25/2019, 66 on 12/22/2019, < 5 on 01/19/2020, and 67 on 02/02/2020.  Aldolasewas 67.2 in06/05/2017, 69.9 on06/27/2018, 64.8 on07/11/2017,57.6 on08/05/2017, 60.3 on09/03/2017, 38.7 on 12/20/2017, 9.3 on01/28/2019, 7.4 on02/28/2019, 29.1 on04/18/2019, to51.9 on07/29/2019, 23.7 on09/13/2019, 16.4 on 12/02/2018,14.1 on 01/01/2019,8.8 on 03/03/2019, 53.4 on 05/06/2019,42.9 on 05/20/2019, 39.0 in 06/16/2019, 69.3 on 07/28/2019, 37.2 on 08/25/2019, 12.5 on 09/22/2019, 7.2 on 10/21/2019, 5.2 on 11/25/2019, 4.8 on 12/22/2019, 3.9 on 01/19/2020, and 4.2 on 02/02/2020.  She received IVIG (Privigen) 75 gm x 2 days every 4 weeks beginning 12/07/2017.IVIG was decreased to every 8 weeks on 04/22/2019, butthen returned to monthly secondary to rise CK.IVIG wasincreased to 2 gm/kg on08/31/2020,09/23/2019, 10/21/2019, and 11/25/2019.  She has a normocytic anemia. Baseline hemoglobin is 10.  Anemia work-up on 12/22/2019 revealed a hematocrit was 32.4, hemoglobin 10.0, MCV 92.8, platelets 258,000, WBC 6200, ANC 3700. Retic was 3.8%. LDH was 178. Coombs was negative.  Peripheral smear revealed a normocytic anemia.  The morphology of the RBCs, WBCs and platelets were within normal limits.  Last colonoscopy on 03/07/2016 revealed grade I hemorrhoids.She has B12 deficiencyand is on oral B12. B12 was 306 and folate >22.3 on 05/30/2017.   She has chronicmild elevation in LFTswhich have correlated with increased CPK.AST/ALThave been followed:53/54 on 02/17/2019, 107/128 on04/27/2020, 134/200 on05/26/2020,138/261 on 07/10/2019,151/304 on08/02/2019, 175/289 on 08/13/2019, 102/196 on 08/25/2019, 47/75 on 09/22/2019, 12/23 on 11/25/2019, 19/16 on 12/22/2019, and 20/16 on 01/19/2020. Hepatitis B and C testing werenegative on 06/27/2018and 07/28/2019.  Symptomatically, ***  Plan: 1.   Labs today: CBC with diff, CMP, CK, aldolase.  2.Polymyositis Clinically, shecontinues to do extremely well onIVIG to 2 gm/kg monthly. She is currently on prednisone10mg /day and MTX10mg SQweekly. CKis normal (< 5). Aldolaseis normal (3.9). Discussed today with Dr [name]- plan to switch IVIG to every 6 weeks.                         No IVIG today.  RTC in 2 weeks for infusion. 3.Elevated LFTs, resolved             AST 20 and ALT 16. Increasedliver function testscorrelates withincreased CPK. CPK reflects activityofpolymyositis. Continue to monitor prior to each infusion. 4.Normocytic anemia Hematocrit32.4. Hemoglobin 10.0. MCV 92.8 on 12/22/2019.             Hematocrit37.6. Hemoglobin 11.8. MCV 91.7 on 01/19/2020. Ferritin205 with an iron sat 40% and a TIBC 278. [id number]  andfolate35.0.  Reticwas 3.8%. Coombsnegative [id number]. Continue to monitor. 5.RTC in 2 weeks for labs (CBC with diff, CMP, CK, aldolase) and IVIG2 gm/kg(500 mg/kg per dosedailyx4). 6.   RTC in 8 weeks for MD assessment, labs (CBC with diff, CMP, CK, aldolase), and IVIG over 4 days.    I discussed the assessment and treatment plan with the patient.  The patient was provided an opportunity to ask questions and all were answered.  The patient agreed with the plan and demonstrated an understanding of the instructions.  The patient was advised to call back if the symptoms worsen or if the condition fails to improve as anticipated.  I provided *** minutes of face-to-face time during this this encounter and > 50% was spent counseling as documented under my assessment and plan.    Lequita Asal, MD, PhD    03/15/2020, 11:49 AM  I, Selena Batten, am acting as scribe for [employer]. Mike Gip, MD, PhD.  {Add scribe attestation statement}

## 2020-03-15 NOTE — Telephone Encounter (Signed)
Lab has been faxed to Dr [name] office. Fax conformation confirmed.

## 2020-03-16 ENCOUNTER — Inpatient Hospital Stay: Payer: Medicare PPO

## 2020-03-16 LAB — ALDOLASE: Aldolase: 5.5 U/L (ref 3.3–10.3)

## 2020-03-17 ENCOUNTER — Inpatient Hospital Stay: Payer: Medicare PPO

## 2020-03-18 ENCOUNTER — Inpatient Hospital Stay: Payer: Medicare PPO

## 2020-03-24 ENCOUNTER — Other Ambulatory Visit: Payer: Self-pay

## 2020-03-24 DIAGNOSIS — R7989 Other specified abnormal findings of blood chemistry: Secondary | ICD-10-CM

## 2020-03-24 DIAGNOSIS — Z79899 Other long term (current) drug therapy: Secondary | ICD-10-CM

## 2020-03-24 DIAGNOSIS — M3320 Polymyositis, organ involvement unspecified: Secondary | ICD-10-CM

## 2020-03-24 DIAGNOSIS — M3329 Polymyositis with other organ involvement: Secondary | ICD-10-CM

## 2020-03-24 DIAGNOSIS — D649 Anemia, unspecified: Secondary | ICD-10-CM

## 2020-03-25 NOTE — Progress Notes (Signed)
[facility]  [address], [address] [place], [place] [address] Phone: [phone]  Fax: [phone]   Clinic Day:  03/29/2020  Referring physician: [name], MD  Chief Complaint: Bridget Romero is a 74 y.o. 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  HPI:  The patient was last seen in the hematology clinic on 01/19/2020. At that time, she was doing well. Exam was normal. Hematocrit was 37.6, hemoglobin 11.8, platelets 271,000, WBC 8,800.  LFTs were normal.  Aldolase 3.9. CK <5.  As she was doing well, decision was made to switch her IVIG to every 6 weeks (last received 12/22/2019).   She received IVIG x 4 (02/02/2020 - 02/05/2020).   Patient was seen by Dr. [name] on 03/02/2020.  She was doing well.  CBC revealed a hematocrit 33.7, hemoglobin 10.3, MCV 94.4, platelets 288,000, WBC 6400 (ANC 5410).  LFTs were normal.  Aldolase 4.8.  CK 184.  Plan was to lower her prednisone to 5 mg and spread out her IVIG to every 2 months if labs were stable.  Next infusion would be 03/29/2020.  Labs followed: 02/02/2020: Hematocrit 33.2, hemoglobin 10.2, platelets 240,000, WBC 7,400. LFTs were normal.  Aldolase 4.2. CK 67. 03/15/2020: Hematocrit 32.3, hemoglobin 10.0, platelets 248,000, WBC 5,900. Total protein 6.4. Albumin 3.4.  Aldolase 5.5. Total CK 254.   During the interim, she has felt good. She reports no change in her strength. She denies any itchy or watery eyes today. Her blood sugar is under good control. She has jaw pain with eating and yawning. She notes her legs feel tight.  Patient is on prednisone 5 mg a day.    Past Medical History:  Diagnosis Date  . Allergy   . Asthma   . Diabetes mellitus   . Hyperlipidemia   . Hypertension   . Neuromuscular disorder (HCC)    polymyositis  . Osteoarthritis   . Vitamin B12 deficiency     Past Surgical History:  Procedure Laterality Date  . COLONOSCOPY WITH PROPOFOL  N/A 03/07/2016   Procedure: COLONOSCOPY WITH PROPOFOL;  Surgeon: Lollie Sails, MD;  Location: [facility] ENDOSCOPY;  Service: Endoscopy;  Laterality: N/A;    Family History  Problem Relation Age of Onset  . Heart disease Mother   . Cancer Father     Social History:  reports that she has quit smoking. She has never used smokeless tobacco. She reports that she does not drink alcohol or use drugs. 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 The patient is alone today.  Allergies:  Allergies  Allergen Reactions  . Atorvastatin Other (See Comments)    myalgias  . Pravastatin Other (See Comments)    Muscle pain    Current Medications: Current Outpatient Medications  Medication Sig Dispense Refill  . ACCU-CHEK FASTCLIX LANCETS MISC Use to test blood sugar twice daily dx: 250.02 100 each 3  . acetaminophen (TYLENOL) 500 MG tablet Take 500 mg by mouth every 6 (six) hours as needed.    . Alcohol Swabs (B-D SINGLE USE SWABS REGULAR) PADS Use to test blood sugar once daily dx: 250.00 100 each 3  . alendronate (FOSAMAX) 70 MG tablet TAKE 1 TABLET BY MOUTH EVERY 7 DAYS WITHA FULL GLASS OF WATER. (DO NOT LIE DOWN FOR THE NEXT 30 MINUTES) 12 tablet 3  . atorvastatin (LIPITOR) 10 MG tablet TAKE 1 TABLET BY MOUTH ONCE DAILY 90 tablet 3  . Blood Glucose Calibration (  ACCU-CHEK AVIVA) SOLN Use as directed 1 each 3  . Cholecalciferol (VITAMIN D) 1000 UNITS capsule Take 1,000 Units by mouth daily.      . folic acid (FOLVITE) 1 MG tablet Take 1 mg by mouth daily.    Marland Kitchen gabapentin (NEURONTIN) 300 MG capsule Take 300 mg by mouth 3 (three) times daily.     Marland Kitchen glucose blood (ACCU-CHEK SMARTVIEW) test strip Use to check blood sugar twice a day Dx Code E11.4 100 each 6  . insulin glargine (LANTUS) 100 UNIT/ML injection Inject 0.2 mLs (20 Units total) into the skin daily. (Patient taking differently: Inject 20 Units into the skin daily. Sliding Scale) 1 mL 0  . lisinopril (PRINIVIL,ZESTRIL) 10 MG tablet TAKE 1 TABLET  BY MOUTH EVERY DAY 90 tablet 1  . metFORMIN (GLUCOPHAGE) 1000 MG tablet TAKE 1 TABLET BY MOUTH TWICE A DAY 180 tablet 3  . methotrexate 250 MG/10ML injection Inject into the skin.    . Multiple Vitamin (MULTIVITAMIN) tablet Take 1 tablet by mouth daily.      . predniSONE (DELTASONE) 10 MG tablet Take 5 mg by mouth daily.     . methotrexate 50 MG/2ML injection Inject into the skin once a week.      No current facility-administered medications for this visit.    Review of Systems  Constitutional: Negative for chills, diaphoresis, fever, malaise/fatigue and weight loss (up 8 lbs).       Feels good. Strength is stable.  HENT: Negative.  Negative for congestion, ear discharge, ear pain, hearing loss, nosebleeds, sinus pain and sore throat.        Jaw pain.  Eyes: Negative.  Negative for blurred vision, double vision and photophobia.  Respiratory: Negative.  Negative for cough, hemoptysis, sputum production and shortness of breath.   Cardiovascular: Negative.  Negative for chest pain, palpitations, orthopnea, claudication and leg swelling.  Gastrointestinal: Negative.  Negative for abdominal pain, blood in stool, constipation, diarrhea, heartburn, melena, nausea and vomiting.  Genitourinary: Negative.  Negative for dysuria, flank pain, frequency, hematuria and urgency.  Musculoskeletal: Positive for joint pain. Negative for back pain, falls, myalgias and neck pain.       Legs feel tight.  Skin: Negative.  Negative for itching and rash.  Neurological: Negative.  Negative for dizziness, tingling, tremors, sensory change, speech change, focal weakness, weakness and headaches.  Endo/Heme/Allergies: Negative.  Does not bruise/bleed easily.       Diabetes on insulin and Metformin.  Blood sugar is "good".  Psychiatric/Behavioral: Negative.  Negative for depression and memory loss. The patient is not nervous/anxious and does not have insomnia.   All other systems reviewed and are  negative.  Performance status (ECOG): 1  Vitals Blood pressure (!) 144/74, pulse 76, temperature (!) 96.8 F (36 C), temperature source Tympanic, resp. rate 18, weight 194 lb 0.1 oz (88 kg), SpO2 100 %.   Physical Exam  Constitutional: She is oriented to person, place, and time. She appears well-developed and well-nourished. No distress. Face mask in place.  HENT:  Head: Normocephalic and atraumatic.  Mouth/Throat: No oropharyngeal exudate.  Brown cap. Short curly dark hair.  Eyes: Pupils are equal, round, and reactive to light. Conjunctivae and EOM are normal. No scleral icterus.  Brown eyes.   Neck: No JVD present.  Cardiovascular: Normal rate, regular rhythm and normal heart sounds. Exam reveals no gallop and no friction rub.  No murmur heard. Pulmonary/Chest: Effort normal and breath sounds normal. No respiratory distress. She has no wheezes.  She has no rales.  Abdominal: Soft. Bowel sounds are normal. She exhibits no distension and no mass. There is no abdominal tenderness. There is no rebound and no guarding.  Musculoskeletal:        General: No tenderness, deformity or edema. Normal range of motion.     Cervical back: Normal range of motion and neck supple.  Lymphadenopathy:       Head (right side): No preauricular, no posterior auricular and no occipital adenopathy present.       Head (left side): No preauricular, no posterior auricular and no occipital adenopathy present.    She has no cervical adenopathy.    She has no axillary adenopathy.       Right: No inguinal and no supraclavicular adenopathy present.       Left: No inguinal and no supraclavicular adenopathy present.  Neurological: She is alert and oriented to person, place, and time. She has normal strength. She displays no atrophy and no tremor. No cranial nerve deficit or sensory deficit. She exhibits normal muscle tone. Coordination normal.  Lower extremity strength 5/5 left and 5/5 right.  Gait is normal.  Able to  rise from a chair without using her arms.  Skin: Skin is warm and dry. No rash noted. She is not diaphoretic. No erythema. No pallor.  Psychiatric: She has a normal mood and affect. Her behavior is normal. Judgment and thought content normal.  Nursing note and vitals reviewed.   Appointment on 03/29/2020  Component Date Value Ref Range Status  . WBC 03/29/2020 6.4  4.0 - 10.5 K/uL Final  . RBC 03/29/2020 3.42* 3.87 - 5.11 MIL/uL Final  . Hemoglobin 03/29/2020 10.0* 12.0 - 15.0 g/dL Final  . HCT 03/29/2020 32.2* 36.0 - 46.0 % Final  . MCV 03/29/2020 94.2  80.0 - 100.0 fL Final  . MCH 03/29/2020 29.2  26.0 - 34.0 pg Final  . MCHC 03/29/2020 31.1  30.0 - 36.0 g/dL Final  . RDW 03/29/2020 15.0  11.5 - 15.5 % Final  . Platelets 03/29/2020 248  150 - 400 K/uL Final  . nRBC 03/29/2020 0.0  0.0 - 0.2 % Final  . Neutrophils Relative % 03/29/2020 67  % Final  . Neutro Abs 03/29/2020 4.3  1.7 - 7.7 K/uL Final  . Lymphocytes Relative 03/29/2020 24  % Final  . Lymphs Abs 03/29/2020 1.6  0.7 - 4.0 K/uL Final  . Monocytes Relative 03/29/2020 6  % Final  . Monocytes Absolute 03/29/2020 0.4  0.1 - 1.0 K/uL Final  . Eosinophils Relative 03/29/2020 2  % Final  . Eosinophils Absolute 03/29/2020 0.2  0.0 - 0.5 K/uL Final  . Basophils Relative 03/29/2020 1  % Final  . Basophils Absolute 03/29/2020 0.0  0.0 - 0.1 K/uL Final  . Immature Granulocytes 03/29/2020 0  % Final  . Abs Immature Granulocytes 03/29/2020 0.02  0.00 - 0.07 K/uL Final   Performed at [facility], [address], [place],  [address]    Assessment:  Bridget Romero is a 74 y.o. 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 She currently receives IVIG every other month,methotrexate weekly, and prednisone5mg  a day.  CKhas been followed: >5555 on 05/30/2017, 4409 on 06/20/2017, 3546 on 07/05/2017, 2725 on 07/30/2017, 3675 on 08/28/2017, 2405 on 10/08/2017, 2743 on 11/08/2017, 2723 on 12/20/2017, 723 on 01/21/2018, 507 on  02/21/2018, 2192 on 04/11/2018, 2227 on 06/04/2018, 4468 on 07/22/2018, 2204 on 09/06/2018, 1360 on 12/02/2018, 1423 on 01/01/2019,912 on 03/03/2019, 3955 on 04/21/2019,4612 on 05/06/2019,4560 on  05/20/2019, 4532 on 06/16/2019,4935 on 07/10/2019,4330 on 07/28/2019, 3645 on 08/13/2019, 2514 on 08/25/2019, 807 on 09/22/2019, 251 on 10/21/2019, 67 on 11/25/2019, 66 on 12/22/2019, < 5 on 01/19/2020, 67 on 02/02/2020, 254 on 03/15/2020, and 338 on 03/29/2020.  Aldolasewas 67.2 in06/05/2017, 69.9 on06/27/2018, 64.8 on07/11/2017,57.6 on08/05/2017, 60.3 on09/03/2017, 38.7 on 12/20/2017, 9.3 on01/28/2019, 7.4 on02/28/2019, 29.1 on04/18/2019, to51.9 on07/29/2019, 23.7 on09/13/2019, 16.4 on 12/02/2018,14.1 on 01/01/2019,8.8 on 03/03/2019, 53.4 on 05/06/2019,42.9 on 05/20/2019, 39.0 in 06/16/2019, 69.3 on 07/28/2019, 37.2 on 08/25/2019, 12.5 on 09/22/2019, 7.2 on 10/21/2019, 5.2 on 11/25/2019, 4.8 on 12/22/2019, 3.9 on 01/19/2020, 4.2 on 02/02/2020, 5.5 on 03/15/2020, and 5.6 on 03/29/2020.  She received IVIG (Privigen) 75 gm x 2 days every 4 weeks beginning 12/07/2017.IVIG was decreased to every 8 weeks on 04/22/2019, butthen returned to monthly secondary to rise CK.IVIG wasincreased to 2 gm/kg on08/31/2020,09/23/2019, 10/21/2019, 11/25/2019, and 02/02/2020.  She was switched to every 8 weeks.  She has a normocytic anemia. Baseline hemoglobin is 10. Anemia work-up on 12/22/2019 revealed a hematocrit was 32.4, hemoglobin 10.0, MCV 92.8, platelets 258,000, WBC 6200, ANC 3700. Retic was 3.8%. LDH was 178. Coombs was negative.  Peripheral smear revealed a normocytic anemia.  The morphology of the RBCs, WBCs and platelets were within normal limits.  Last colonoscopy on 03/07/2016 revealed grade I hemorrhoids.She has B12 deficiencyand is on oral B12. B12 was 306 and folate >22.3 on 05/30/2017.   She has a chronic mild elevation in LFTswhich has correlated with increased  CPK.AST/ALThave been followed:53/54 on 02/17/2019, 107/128 on04/27/2020, 134/200 on05/26/2020,138/261 on 07/10/2019,151/304 on08/02/2019, 175/289 on 08/13/2019, 102/196 on 08/25/2019, 47/75 on 09/22/2019, 12/23 on 11/25/2019, 19/16 on 12/22/2019, 20/16 on 01/19/2020, 19/15 on 02/02/2020, 22/17 on 03/15/2020, and 25/19 on 03/29/2020. Hepatitis B and C testing werenegative on 06/27/2018and 07/28/2019.  Symptomatically, she is doing well.  Legs feel tight.  CK has increased to 338.  Plan: 1.   Labs today: CBC with diff, CMP, CK, aldolase. 2.Polymyositis Clinically, sheis doing well on IVIG every other month. She is currently on prednisone5 mg/day and MTX25mg SQweekly. CKis slightly elevated (338). Aldolaseis normal (5.6). Phone follow-up with Dr [name]- continue IVIG every 2 months.  Labs reviewed.  IVIG daily x 4 beginning today. 3.Elevated LFTs, resolved.             AST 25 and ALT 19. Increasedliver function testscorrelates withincreased CPK. CPK reflects activityofpolymyositis. Continue to monitor prior to each infusion. 4.Normocytic anemia             Hematocrit32.2. Hemoglobin 10.0. MCV 94.2 today. Ferritin205 with an iron sat 40% and a TIBC 278 on 11/25/2019. [id number] andfolate35.0 on 11/25/2019. Reticwas 3.8%. Coombsnegative [id number]. Continue to monitor. 5.RTC in 8 weeks for MD assessment, labs (CBC with diff, CMP, CK, aldolase) and IVIG over 4 days.  I discussed the assessment and treatment plan with the patient.  The patient was provided an opportunity to ask questions and all were answered.  The patient agreed with the plan and demonstrated an understanding of the instructions.  The patient was advised to call back if the symptoms worsen or if the condition fails to improve as  anticipated.   Lequita Asal, MD, PhD    03/29/2020, 9:05 AM  I, Selena Batten, am acting as scribe for [employer]. Mike Gip, MD, PhD.  I, Larita Deremer C. Mike Gip, MD, have reviewed the above documentation for accuracy and completeness, and I agree with the above.

## 2020-03-26 ENCOUNTER — Encounter: Payer: Self-pay | Admitting: Hematology and Oncology

## 2020-03-26 NOTE — Progress Notes (Signed)
The patient c/o pain noted to bilateral legs 5 today constant. The patient Name and DOB has been verified by phone today.

## 2020-03-29 ENCOUNTER — Inpatient Hospital Stay: Payer: Medicare PPO | Attending: Hematology and Oncology

## 2020-03-29 ENCOUNTER — Inpatient Hospital Stay: Payer: Medicare PPO | Attending: Hematology and Oncology | Admitting: Hematology and Oncology

## 2020-03-29 ENCOUNTER — Other Ambulatory Visit: Payer: Self-pay

## 2020-03-29 ENCOUNTER — Inpatient Hospital Stay: Payer: Medicare PPO

## 2020-03-29 ENCOUNTER — Encounter: Payer: Self-pay | Admitting: Hematology and Oncology

## 2020-03-29 VITALS — BP 144/74 | HR 76 | Temp 96.8°F | Resp 18 | Wt 194.0 lb

## 2020-03-29 VITALS — BP 154/75 | HR 73 | Resp 16

## 2020-03-29 DIAGNOSIS — D649 Anemia, unspecified: Secondary | ICD-10-CM | POA: Diagnosis not present

## 2020-03-29 DIAGNOSIS — M3320 Polymyositis, organ involvement unspecified: Secondary | ICD-10-CM | POA: Diagnosis not present

## 2020-03-29 DIAGNOSIS — R7989 Other specified abnormal findings of blood chemistry: Secondary | ICD-10-CM

## 2020-03-29 DIAGNOSIS — Z79899 Other long term (current) drug therapy: Secondary | ICD-10-CM

## 2020-03-29 LAB — CBC WITH DIFFERENTIAL/PLATELET
Abs Immature Granulocytes: 0.02 10*3/uL (ref 0.00–0.07)
Basophils Absolute: 0 10*3/uL (ref 0.0–0.1)
Basophils Relative: 1 %
Eosinophils Absolute: 0.2 10*3/uL (ref 0.0–0.5)
Eosinophils Relative: 2 %
HCT: 32.2 % — ABNORMAL LOW (ref 36.0–46.0)
Hemoglobin: 10 g/dL — ABNORMAL LOW (ref 12.0–15.0)
Immature Granulocytes: 0 %
Lymphocytes Relative: 24 %
Lymphs Abs: 1.6 10*3/uL (ref 0.7–4.0)
MCH: 29.2 pg (ref 26.0–34.0)
MCHC: 31.1 g/dL (ref 30.0–36.0)
MCV: 94.2 fL (ref 80.0–100.0)
Monocytes Absolute: 0.4 10*3/uL (ref 0.1–1.0)
Monocytes Relative: 6 %
Neutro Abs: 4.3 10*3/uL (ref 1.7–7.7)
Neutrophils Relative %: 67 %
Platelets: 248 10*3/uL (ref 150–400)
RBC: 3.42 MIL/uL — ABNORMAL LOW (ref 3.87–5.11)
RDW: 15 % (ref 11.5–15.5)
WBC: 6.4 10*3/uL (ref 4.0–10.5)
nRBC: 0 % (ref 0.0–0.2)

## 2020-03-29 LAB — COMPREHENSIVE METABOLIC PANEL
ALT: 19 U/L (ref 0–44)
AST: 25 U/L (ref 15–41)
Albumin: 3.7 g/dL (ref 3.5–5.0)
Alkaline Phosphatase: 40 U/L (ref 38–126)
Anion gap: 7 (ref 5–15)
BUN: 17 mg/dL (ref 8–23)
CO2: 26 mmol/L (ref 22–32)
Calcium: 8.8 mg/dL — ABNORMAL LOW (ref 8.9–10.3)
Chloride: 106 mmol/L (ref 98–111)
Creatinine, Ser: 0.69 mg/dL (ref 0.44–1.00)
GFR calc Af Amer: >60 mL/min (ref >60)
GFR calc non Af Amer: >60 mL/min (ref >60)
Glucose, Bld: 148 mg/dL — ABNORMAL HIGH (ref 70–99)
Potassium: 3.6 mmol/L (ref 3.5–5.1)
Sodium: 139 mmol/L (ref 135–145)
Total Bilirubin: 0.8 mg/dL (ref 0.3–1.2)
Total Protein: 6.5 g/dL (ref 6.5–8.1)

## 2020-03-29 LAB — CK: Total CK: 338 U/L — ABNORMAL HIGH (ref 38–234)

## 2020-03-29 MED ORDER — ACETAMINOPHEN 325 MG PO TABS
650.0000 mg | ORAL_TABLET | Freq: Once | ORAL | Status: AC
  Administered 2020-03-29: 650 mg via ORAL

## 2020-03-29 MED ORDER — DIPHENHYDRAMINE HCL 25 MG PO TABS
25.0000 mg | ORAL_TABLET | Freq: Once | ORAL | Status: AC
  Administered 2020-03-29: 10:00:00 25 mg via ORAL
  Filled 2020-03-29: qty 1

## 2020-03-29 MED ORDER — DEXTROSE 5 % IV SOLN
Freq: Once | INTRAVENOUS | Status: AC
  Filled 2020-03-29: qty 250

## 2020-03-29 MED ORDER — METHYLPREDNISOLONE SODIUM SUCC 125 MG IJ SOLR
40.0000 mg | INTRAMUSCULAR | Status: DC
  Administered 2020-03-29: 40 mg via INTRAVENOUS

## 2020-03-29 MED ORDER — IMMUNE GLOBULIN (HUMAN) 20 GM/200ML IV SOLN
40.0000 g | INTRAVENOUS | Status: DC
  Administered 2020-03-29: 10:00:00 40 g via INTRAVENOUS
  Filled 2020-03-29: qty 400

## 2020-03-29 NOTE — Progress Notes (Signed)
Patient complains of jaw pain when she moves jaw on right side

## 2020-03-29 NOTE — Patient Instructions (Signed)
Immune Globulin Injection What is this medicine? IMMUNE GLOBULIN (im MUNE GLOB yoo lin) helps to prevent or reduce the severity of certain infections in patients who are at risk. This medicine is collected from the pooled blood of many donors. It is used to treat immune system problems, thrombocytopenia, and Kawasaki syndrome. This medicine may be used for other purposes; ask your health care provider or pharmacist if you have questions. COMMON BRAND NAME(S): ASCENIV, Baygam, BIVIGAM, Carimune, Carimune NF, cutaquig, Cuvitru, Flebogamma, Flebogamma DIF, GamaSTAN, GamaSTAN S/D, Gamimune N, Gammagard, Gammagard S/D, Gammaked, Gammaplex, Gammar-P IV, Gamunex, Gamunex-C, Hizentra, Iveegam, Iveegam EN, Octagam, Panglobulin, Panglobulin NF, panzyga, Polygam S/D, Privigen, Sandoglobulin, Venoglobulin-S, Vigam, Vivaglobulin, Xembify What should I tell my health care provider before I take this medicine? They need to know if you have any of these conditions:  diabetes  extremely low or no immune antibodies in the blood  heart disease  history of blood clots  hyperprolinemia  infection in the blood, sepsis  kidney disease  recently received or scheduled to receive a vaccination  an unusual or allergic reaction to human immune globulin, albumin, maltose, sucrose, other medicines, foods, dyes, or preservatives  pregnant or trying to get pregnant  breast-feeding How should I use this medicine? This medicine is for injection into a muscle or infusion into a vein or skin. It is usually given by a health care professional in a hospital or clinic setting. In rare cases, some brands of this medicine might be given at home. You will be taught how to give this medicine. Use exactly as directed. Take your medicine at regular intervals. Do not take your medicine more often than directed. Talk to your pediatrician regarding the use of this medicine in children. While this drug may be prescribed for selected  conditions, precautions do apply. Overdosage: If you think you have taken too much of this medicine contact a poison control center or emergency room at once. NOTE: This medicine is only for you. Do not share this medicine with others. What if I miss a dose? It is important not to miss your dose. Call your doctor or health care professional if you are unable to keep an appointment. If you give yourself the medicine and you miss a dose, take it as soon as you can. If it is almost time for your next dose, take only that dose. Do not take double or extra doses. What may interact with this medicine?  aspirin and aspirin-like medicines  cisplatin  cyclosporine  medicines for infection like acyclovir, adefovir, amphotericin B, bacitracin, cidofovir, foscarnet, ganciclovir, gentamicin, pentamidine, vancomycin  NSAIDS, medicines for pain and inflammation, like ibuprofen or naproxen  pamidronate  vaccines  zoledronic acid This list may not describe all possible interactions. Give your health care provider a list of all the medicines, herbs, non-prescription drugs, or dietary supplements you use. Also tell them if you smoke, drink alcohol, or use illegal drugs. Some items may interact with your medicine. What should I watch for while using this medicine? Your condition will be monitored carefully while you are receiving this medicine. This medicine is made from pooled blood donations of many different people. It may be possible to pass an infection in this medicine. However, the donors are screened for infections and all products are tested for HIV and hepatitis. The medicine is treated to kill most or all bacteria and viruses. Talk to your doctor about the risks and benefits of this medicine. Do not have vaccinations for at least   14 days before, or until at least 3 months after receiving this medicine. What side effects may I notice from receiving this medicine? Side effects that you should report  to your doctor or health care professional as soon as possible:  allergic reactions like skin rash, itching or hives, swelling of the face, lips, or tongue  blue colored lips or skin  breathing problems  chest pain or tightness  fever  signs and symptoms of aseptic meningitis such as stiff neck; sensitivity to light; headache; drowsiness; fever; nausea; vomiting; rash  signs and symptoms of a blood clot such as chest pain; shortness of breath; pain, swelling, or warmth in the leg  signs and symptoms of hemolytic anemia such as fast heartbeat; tiredness; dark yellow or brown urine; or yellowing of the eyes or skin  signs and symptoms of kidney injury like trouble passing urine or change in the amount of urine  sudden weight gain  swelling of the ankles, feet, hands Side effects that usually do not require medical attention (report to your doctor or health care professional if they continue or are bothersome):  diarrhea  flushing  headache  increased sweating  joint pain  muscle cramps  muscle pain  nausea  pain, redness, or irritation at site where injected  tiredness This list may not describe all possible side effects. Call your doctor for medical advice about side effects. You may report side effects to FDA at 1-800-FDA-1088. Where should I keep my medicine? Keep out of the reach of children. This drug is usually given in a hospital or clinic and will not be stored at home. In rare cases, some brands of this medicine may be given at home. If you are using this medicine at home, you will be instructed on how to store this medicine. Throw away any unused medicine after the expiration date on the label. NOTE: This sheet is a summary. It may not cover all possible information. If you have questions about this medicine, talk to your doctor, pharmacist, or health care provider.  2020 Elsevier/Gold Standard (2019-07-16 12:51:14)  

## 2020-03-30 ENCOUNTER — Ambulatory Visit
Admission: RE | Admit: 2020-03-30 | Discharge: 2020-03-30 | Disposition: A | Discharge status: Home / Self Care | Payer: Medicare PPO | Source: Ambulatory Visit | Attending: Internal Medicine | Admitting: Internal Medicine

## 2020-03-30 ENCOUNTER — Inpatient Hospital Stay: Payer: Medicare PPO

## 2020-03-30 VITALS — BP 154/76 | HR 80 | Resp 16

## 2020-03-30 DIAGNOSIS — Z1231 Encounter for screening mammogram for malignant neoplasm of breast: Secondary | ICD-10-CM | POA: Diagnosis not present

## 2020-03-30 DIAGNOSIS — M3320 Polymyositis, organ involvement unspecified: Secondary | ICD-10-CM

## 2020-03-30 LAB — ALDOLASE: Aldolase: 5.6 U/L (ref 3.3–10.3)

## 2020-03-30 MED ORDER — METHYLPREDNISOLONE SODIUM SUCC 125 MG IJ SOLR
40.0000 mg | INTRAMUSCULAR | Status: DC
  Administered 2020-03-30: 40 mg via INTRAVENOUS
  Filled 2020-03-30: qty 2

## 2020-03-30 MED ORDER — ACETAMINOPHEN 325 MG PO TABS
650.0000 mg | ORAL_TABLET | Freq: Once | ORAL | Status: AC
  Administered 2020-03-30: 650 mg via ORAL
  Filled 2020-03-30: qty 2

## 2020-03-30 MED ORDER — DIPHENHYDRAMINE HCL 25 MG PO TABS
25.0000 mg | ORAL_TABLET | Freq: Once | ORAL | Status: AC
  Administered 2020-03-30: 25 mg via ORAL
  Filled 2020-03-30: qty 1

## 2020-03-30 MED ORDER — DEXTROSE 5 % IV SOLN
Freq: Once | INTRAVENOUS | Status: AC
  Filled 2020-03-30: qty 250

## 2020-03-30 MED ORDER — IMMUNE GLOBULIN (HUMAN) 20 GM/200ML IV SOLN
40.0000 g | INTRAVENOUS | Status: DC
  Administered 2020-03-30: 40 g via INTRAVENOUS
  Filled 2020-03-30: qty 400

## 2020-03-30 NOTE — Patient Instructions (Signed)
Immune Globulin Injection What is this medicine? IMMUNE GLOBULIN (im MUNE GLOB yoo lin) helps to prevent or reduce the severity of certain infections in patients who are at risk. This medicine is collected from the pooled blood of many donors. It is used to treat immune system problems, thrombocytopenia, and Kawasaki syndrome. This medicine may be used for other purposes; ask your health care provider or pharmacist if you have questions. COMMON BRAND NAME(S): ASCENIV, Baygam, BIVIGAM, Carimune, Carimune NF, cutaquig, Cuvitru, Flebogamma, Flebogamma DIF, GamaSTAN, GamaSTAN S/D, Gamimune N, Gammagard, Gammagard S/D, Gammaked, Gammaplex, Gammar-P IV, Gamunex, Gamunex-C, Hizentra, Iveegam, Iveegam EN, Octagam, Panglobulin, Panglobulin NF, panzyga, Polygam S/D, Privigen, Sandoglobulin, Venoglobulin-S, Vigam, Vivaglobulin, Xembify What should I tell my health care provider before I take this medicine? They need to know if you have any of these conditions:  diabetes  extremely low or no immune antibodies in the blood  heart disease  history of blood clots  hyperprolinemia  infection in the blood, sepsis  kidney disease  recently received or scheduled to receive a vaccination  an unusual or allergic reaction to human immune globulin, albumin, maltose, sucrose, other medicines, foods, dyes, or preservatives  pregnant or trying to get pregnant  breast-feeding How should I use this medicine? This medicine is for injection into a muscle or infusion into a vein or skin. It is usually given by a health care professional in a hospital or clinic setting. In rare cases, some brands of this medicine might be given at home. You will be taught how to give this medicine. Use exactly as directed. Take your medicine at regular intervals. Do not take your medicine more often than directed. Talk to your pediatrician regarding the use of this medicine in children. While this drug may be prescribed for selected  conditions, precautions do apply. Overdosage: If you think you have taken too much of this medicine contact a poison control center or emergency room at once. NOTE: This medicine is only for you. Do not share this medicine with others. What if I miss a dose? It is important not to miss your dose. Call your doctor or health care professional if you are unable to keep an appointment. If you give yourself the medicine and you miss a dose, take it as soon as you can. If it is almost time for your next dose, take only that dose. Do not take double or extra doses. What may interact with this medicine?  aspirin and aspirin-like medicines  cisplatin  cyclosporine  medicines for infection like acyclovir, adefovir, amphotericin B, bacitracin, cidofovir, foscarnet, ganciclovir, gentamicin, pentamidine, vancomycin  NSAIDS, medicines for pain and inflammation, like ibuprofen or naproxen  pamidronate  vaccines  zoledronic acid This list may not describe all possible interactions. Give your health care provider a list of all the medicines, herbs, non-prescription drugs, or dietary supplements you use. Also tell them if you smoke, drink alcohol, or use illegal drugs. Some items may interact with your medicine. What should I watch for while using this medicine? Your condition will be monitored carefully while you are receiving this medicine. This medicine is made from pooled blood donations of many different people. It may be possible to pass an infection in this medicine. However, the donors are screened for infections and all products are tested for HIV and hepatitis. The medicine is treated to kill most or all bacteria and viruses. Talk to your doctor about the risks and benefits of this medicine. Do not have vaccinations for at least   14 days before, or until at least 3 months after receiving this medicine. What side effects may I notice from receiving this medicine? Side effects that you should report  to your doctor or health care professional as soon as possible:  allergic reactions like skin rash, itching or hives, swelling of the face, lips, or tongue  blue colored lips or skin  breathing problems  chest pain or tightness  fever  signs and symptoms of aseptic meningitis such as stiff neck; sensitivity to light; headache; drowsiness; fever; nausea; vomiting; rash  signs and symptoms of a blood clot such as chest pain; shortness of breath; pain, swelling, or warmth in the leg  signs and symptoms of hemolytic anemia such as fast heartbeat; tiredness; dark yellow or brown urine; or yellowing of the eyes or skin  signs and symptoms of kidney injury like trouble passing urine or change in the amount of urine  sudden weight gain  swelling of the ankles, feet, hands Side effects that usually do not require medical attention (report to your doctor or health care professional if they continue or are bothersome):  diarrhea  flushing  headache  increased sweating  joint pain  muscle cramps  muscle pain  nausea  pain, redness, or irritation at site where injected  tiredness This list may not describe all possible side effects. Call your doctor for medical advice about side effects. You may report side effects to FDA at 1-800-FDA-1088. Where should I keep my medicine? Keep out of the reach of children. This drug is usually given in a hospital or clinic and will not be stored at home. In rare cases, some brands of this medicine may be given at home. If you are using this medicine at home, you will be instructed on how to store this medicine. Throw away any unused medicine after the expiration date on the label. NOTE: This sheet is a summary. It may not cover all possible information. If you have questions about this medicine, talk to your doctor, pharmacist, or health care provider.  2020 Elsevier/Gold Standard (2019-07-16 12:51:14)  

## 2020-03-31 ENCOUNTER — Other Ambulatory Visit: Payer: Self-pay

## 2020-03-31 ENCOUNTER — Inpatient Hospital Stay: Payer: Medicare PPO

## 2020-03-31 VITALS — BP 148/81 | HR 82 | Temp 96.9°F | Resp 16

## 2020-03-31 DIAGNOSIS — M3320 Polymyositis, organ involvement unspecified: Secondary | ICD-10-CM | POA: Diagnosis not present

## 2020-03-31 MED ORDER — IMMUNE GLOBULIN (HUMAN) 20 GM/200ML IV SOLN
40.0000 g | INTRAVENOUS | Status: DC
  Administered 2020-03-31: 10:00:00 40 g via INTRAVENOUS
  Filled 2020-03-31: qty 400

## 2020-03-31 MED ORDER — DEXTROSE 5 % IV SOLN
Freq: Once | INTRAVENOUS | Status: AC
  Filled 2020-03-31: qty 250

## 2020-03-31 MED ORDER — METHYLPREDNISOLONE SODIUM SUCC 125 MG IJ SOLR
40.0000 mg | INTRAMUSCULAR | Status: DC
  Administered 2020-03-31: 09:00:00 40 mg via INTRAVENOUS

## 2020-03-31 MED ORDER — DIPHENHYDRAMINE HCL 25 MG PO TABS
25.0000 mg | ORAL_TABLET | Freq: Once | ORAL | Status: AC
  Administered 2020-03-31: 09:00:00 25 mg via ORAL
  Filled 2020-03-31: qty 1

## 2020-03-31 MED ORDER — ACETAMINOPHEN 325 MG PO TABS
650.0000 mg | ORAL_TABLET | Freq: Once | ORAL | Status: AC
  Administered 2020-03-31: 09:00:00 650 mg via ORAL

## 2020-03-31 NOTE — Patient Instructions (Signed)
Immune Globulin Injection What is this medicine? IMMUNE GLOBULIN (im MUNE GLOB yoo lin) helps to prevent or reduce the severity of certain infections in patients who are at risk. This medicine is collected from the pooled blood of many donors. It is used to treat immune system problems, thrombocytopenia, and Kawasaki syndrome. This medicine may be used for other purposes; ask your health care provider or pharmacist if you have questions. COMMON BRAND NAME(S): ASCENIV, Baygam, BIVIGAM, Carimune, Carimune NF, cutaquig, Cuvitru, Flebogamma, Flebogamma DIF, GamaSTAN, GamaSTAN S/D, Gamimune N, Gammagard, Gammagard S/D, Gammaked, Gammaplex, Gammar-P IV, Gamunex, Gamunex-C, Hizentra, Iveegam, Iveegam EN, Octagam, Panglobulin, Panglobulin NF, panzyga, Polygam S/D, Privigen, Sandoglobulin, Venoglobulin-S, Vigam, Vivaglobulin, Xembify What should I tell my health care provider before I take this medicine? They need to know if you have any of these conditions:  diabetes  extremely low or no immune antibodies in the blood  heart disease  history of blood clots  hyperprolinemia  infection in the blood, sepsis  kidney disease  recently received or scheduled to receive a vaccination  an unusual or allergic reaction to human immune globulin, albumin, maltose, sucrose, other medicines, foods, dyes, or preservatives  pregnant or trying to get pregnant  breast-feeding How should I use this medicine? This medicine is for injection into a muscle or infusion into a vein or skin. It is usually given by a health care professional in a hospital or clinic setting. In rare cases, some brands of this medicine might be given at home. You will be taught how to give this medicine. Use exactly as directed. Take your medicine at regular intervals. Do not take your medicine more often than directed. Talk to your pediatrician regarding the use of this medicine in children. While this drug may be prescribed for selected  conditions, precautions do apply. Overdosage: If you think you have taken too much of this medicine contact a poison control center or emergency room at once. NOTE: This medicine is only for you. Do not share this medicine with others. What if I miss a dose? It is important not to miss your dose. Call your doctor or health care professional if you are unable to keep an appointment. If you give yourself the medicine and you miss a dose, take it as soon as you can. If it is almost time for your next dose, take only that dose. Do not take double or extra doses. What may interact with this medicine?  aspirin and aspirin-like medicines  cisplatin  cyclosporine  medicines for infection like acyclovir, adefovir, amphotericin B, bacitracin, cidofovir, foscarnet, ganciclovir, gentamicin, pentamidine, vancomycin  NSAIDS, medicines for pain and inflammation, like ibuprofen or naproxen  pamidronate  vaccines  zoledronic acid This list may not describe all possible interactions. Give your health care provider a list of all the medicines, herbs, non-prescription drugs, or dietary supplements you use. Also tell them if you smoke, drink alcohol, or use illegal drugs. Some items may interact with your medicine. What should I watch for while using this medicine? Your condition will be monitored carefully while you are receiving this medicine. This medicine is made from pooled blood donations of many different people. It may be possible to pass an infection in this medicine. However, the donors are screened for infections and all products are tested for HIV and hepatitis. The medicine is treated to kill most or all bacteria and viruses. Talk to your doctor about the risks and benefits of this medicine. Do not have vaccinations for at least   14 days before, or until at least 3 months after receiving this medicine. What side effects may I notice from receiving this medicine? Side effects that you should report  to your doctor or health care professional as soon as possible:  allergic reactions like skin rash, itching or hives, swelling of the face, lips, or tongue  blue colored lips or skin  breathing problems  chest pain or tightness  fever  signs and symptoms of aseptic meningitis such as stiff neck; sensitivity to light; headache; drowsiness; fever; nausea; vomiting; rash  signs and symptoms of a blood clot such as chest pain; shortness of breath; pain, swelling, or warmth in the leg  signs and symptoms of hemolytic anemia such as fast heartbeat; tiredness; dark yellow or brown urine; or yellowing of the eyes or skin  signs and symptoms of kidney injury like trouble passing urine or change in the amount of urine  sudden weight gain  swelling of the ankles, feet, hands Side effects that usually do not require medical attention (report to your doctor or health care professional if they continue or are bothersome):  diarrhea  flushing  headache  increased sweating  joint pain  muscle cramps  muscle pain  nausea  pain, redness, or irritation at site where injected  tiredness This list may not describe all possible side effects. Call your doctor for medical advice about side effects. You may report side effects to FDA at 1-800-FDA-1088. Where should I keep my medicine? Keep out of the reach of children. This drug is usually given in a hospital or clinic and will not be stored at home. In rare cases, some brands of this medicine may be given at home. If you are using this medicine at home, you will be instructed on how to store this medicine. Throw away any unused medicine after the expiration date on the label. NOTE: This sheet is a summary. It may not cover all possible information. If you have questions about this medicine, talk to your doctor, pharmacist, or health care provider.  2020 Elsevier/Gold Standard (2019-07-16 12:51:14)  

## 2020-04-01 ENCOUNTER — Inpatient Hospital Stay: Payer: Medicare PPO

## 2020-04-01 VITALS — BP 168/81 | HR 71 | Temp 96.6°F | Resp 16

## 2020-04-01 DIAGNOSIS — M3320 Polymyositis, organ involvement unspecified: Secondary | ICD-10-CM

## 2020-04-01 MED ORDER — METHYLPREDNISOLONE SODIUM SUCC 125 MG IJ SOLR
40.0000 mg | INTRAMUSCULAR | Status: DC
  Administered 2020-04-01: 40 mg via INTRAVENOUS

## 2020-04-01 MED ORDER — DEXTROSE 5 % IV SOLN
Freq: Once | INTRAVENOUS | Status: AC
  Filled 2020-04-01: qty 250

## 2020-04-01 MED ORDER — IMMUNE GLOBULIN (HUMAN) 20 GM/200ML IV SOLN
40.0000 g | INTRAVENOUS | Status: DC
  Administered 2020-04-01: 40 g via INTRAVENOUS
  Filled 2020-04-01: qty 400

## 2020-04-01 MED ORDER — DIPHENHYDRAMINE HCL 25 MG PO TABS
25.0000 mg | ORAL_TABLET | Freq: Once | ORAL | Status: AC
  Administered 2020-04-01: 25 mg via ORAL
  Filled 2020-04-01: qty 1

## 2020-04-01 MED ORDER — ACETAMINOPHEN 325 MG PO TABS
650.0000 mg | ORAL_TABLET | Freq: Once | ORAL | Status: AC
  Administered 2020-04-01: 650 mg via ORAL

## 2020-04-01 NOTE — Patient Instructions (Signed)
Immune Globulin Injection What is this medicine? IMMUNE GLOBULIN (im MUNE GLOB yoo lin) helps to prevent or reduce the severity of certain infections in patients who are at risk. This medicine is collected from the pooled blood of many donors. It is used to treat immune system problems, thrombocytopenia, and Kawasaki syndrome. This medicine may be used for other purposes; ask your health care provider or pharmacist if you have questions. COMMON BRAND NAME(S): ASCENIV, Baygam, BIVIGAM, Carimune, Carimune NF, cutaquig, Cuvitru, Flebogamma, Flebogamma DIF, GamaSTAN, GamaSTAN S/D, Gamimune N, Gammagard, Gammagard S/D, Gammaked, Gammaplex, Gammar-P IV, Gamunex, Gamunex-C, Hizentra, Iveegam, Iveegam EN, Octagam, Panglobulin, Panglobulin NF, panzyga, Polygam S/D, Privigen, Sandoglobulin, Venoglobulin-S, Vigam, Vivaglobulin, Xembify What should I tell my health care provider before I take this medicine? They need to know if you have any of these conditions:  diabetes  extremely low or no immune antibodies in the blood  heart disease  history of blood clots  hyperprolinemia  infection in the blood, sepsis  kidney disease  recently received or scheduled to receive a vaccination  an unusual or allergic reaction to human immune globulin, albumin, maltose, sucrose, other medicines, foods, dyes, or preservatives  pregnant or trying to get pregnant  breast-feeding How should I use this medicine? This medicine is for injection into a muscle or infusion into a vein or skin. It is usually given by a health care professional in a hospital or clinic setting. In rare cases, some brands of this medicine might be given at home. You will be taught how to give this medicine. Use exactly as directed. Take your medicine at regular intervals. Do not take your medicine more often than directed. Talk to your pediatrician regarding the use of this medicine in children. While this drug may be prescribed for selected  conditions, precautions do apply. Overdosage: If you think you have taken too much of this medicine contact a poison control center or emergency room at once. NOTE: This medicine is only for you. Do not share this medicine with others. What if I miss a dose? It is important not to miss your dose. Call your doctor or health care professional if you are unable to keep an appointment. If you give yourself the medicine and you miss a dose, take it as soon as you can. If it is almost time for your next dose, take only that dose. Do not take double or extra doses. What may interact with this medicine?  aspirin and aspirin-like medicines  cisplatin  cyclosporine  medicines for infection like acyclovir, adefovir, amphotericin B, bacitracin, cidofovir, foscarnet, ganciclovir, gentamicin, pentamidine, vancomycin  NSAIDS, medicines for pain and inflammation, like ibuprofen or naproxen  pamidronate  vaccines  zoledronic acid This list may not describe all possible interactions. Give your health care provider a list of all the medicines, herbs, non-prescription drugs, or dietary supplements you use. Also tell them if you smoke, drink alcohol, or use illegal drugs. Some items may interact with your medicine. What should I watch for while using this medicine? Your condition will be monitored carefully while you are receiving this medicine. This medicine is made from pooled blood donations of many different people. It may be possible to pass an infection in this medicine. However, the donors are screened for infections and all products are tested for HIV and hepatitis. The medicine is treated to kill most or all bacteria and viruses. Talk to your doctor about the risks and benefits of this medicine. Do not have vaccinations for at least   14 days before, or until at least 3 months after receiving this medicine. What side effects may I notice from receiving this medicine? Side effects that you should report  to your doctor or health care professional as soon as possible:  allergic reactions like skin rash, itching or hives, swelling of the face, lips, or tongue  blue colored lips or skin  breathing problems  chest pain or tightness  fever  signs and symptoms of aseptic meningitis such as stiff neck; sensitivity to light; headache; drowsiness; fever; nausea; vomiting; rash  signs and symptoms of a blood clot such as chest pain; shortness of breath; pain, swelling, or warmth in the leg  signs and symptoms of hemolytic anemia such as fast heartbeat; tiredness; dark yellow or brown urine; or yellowing of the eyes or skin  signs and symptoms of kidney injury like trouble passing urine or change in the amount of urine  sudden weight gain  swelling of the ankles, feet, hands Side effects that usually do not require medical attention (report to your doctor or health care professional if they continue or are bothersome):  diarrhea  flushing  headache  increased sweating  joint pain  muscle cramps  muscle pain  nausea  pain, redness, or irritation at site where injected  tiredness This list may not describe all possible side effects. Call your doctor for medical advice about side effects. You may report side effects to FDA at 1-800-FDA-1088. Where should I keep my medicine? Keep out of the reach of children. This drug is usually given in a hospital or clinic and will not be stored at home. In rare cases, some brands of this medicine may be given at home. If you are using this medicine at home, you will be instructed on how to store this medicine. Throw away any unused medicine after the expiration date on the label. NOTE: This sheet is a summary. It may not cover all possible information. If you have questions about this medicine, talk to your doctor, pharmacist, or health care provider.  2020 Elsevier/Gold Standard (2019-07-16 12:51:14)  

## 2020-04-22 ENCOUNTER — Ambulatory Visit: Payer: Medicare Other | Admitting: Internal Medicine

## 2020-04-23 ENCOUNTER — Ambulatory Visit: Payer: Medicare PPO | Admitting: Internal Medicine

## 2020-04-23 ENCOUNTER — Other Ambulatory Visit: Payer: Self-pay

## 2020-04-23 ENCOUNTER — Encounter: Payer: Self-pay | Admitting: Internal Medicine

## 2020-04-23 VITALS — BP 110/76 | HR 73 | Temp 97.4°F | Ht 66.0 in | Wt 195.0 lb

## 2020-04-23 DIAGNOSIS — I10 Essential (primary) hypertension: Secondary | ICD-10-CM | POA: Diagnosis not present

## 2020-04-23 DIAGNOSIS — M26621 Arthralgia of right temporomandibular joint: Secondary | ICD-10-CM | POA: Diagnosis not present

## 2020-04-23 DIAGNOSIS — E1165 Type 2 diabetes mellitus with hyperglycemia: Secondary | ICD-10-CM

## 2020-04-23 DIAGNOSIS — Z794 Long term (current) use of insulin: Secondary | ICD-10-CM

## 2020-04-23 DIAGNOSIS — IMO0002 Reserved for concepts with insufficient information to code with codable children: Secondary | ICD-10-CM

## 2020-04-23 DIAGNOSIS — E1140 Type 2 diabetes mellitus with diabetic neuropathy, unspecified: Secondary | ICD-10-CM | POA: Diagnosis not present

## 2020-04-23 DIAGNOSIS — M26629 Arthralgia of temporomandibular joint, unspecified side: Secondary | ICD-10-CM | POA: Insufficient documentation

## 2020-04-23 DIAGNOSIS — M3320 Polymyositis, organ involvement unspecified: Secondary | ICD-10-CM | POA: Diagnosis not present

## 2020-04-23 LAB — POCT GLYCOSYLATED HEMOGLOBIN (HGB A1C): Hemoglobin A1C: 7.5 % — AB (ref 4.0–5.6)

## 2020-04-23 MED ORDER — INSULIN GLARGINE 100 UNIT/ML ~~LOC~~ SOLN: 10.0000 [IU] | Freq: Every day | SUBCUTANEOUS | 0 refills | Status: DC

## 2020-04-23 NOTE — Progress Notes (Signed)
Subjective:    Patient ID: Bridget Romero, female    DOB: May 08, 1946, 74 y.o.   MRN: [id number]  HPI Here for follow up of diabetes and other chronic health conditions This visit occurred during the SARS-CoV-2 public health emergency.  Safety protocols were in place, including screening questions prior to the visit, additional usage of staff PPE, and extensive cleaning of exam room while observing appropriate contact time as indicated for disinfecting solutions.   Checks sugars once or twice a day Taking lantus 12-15 depending on the sugar Down to 5mg  of prednisone Does get sense of low sugar reactions at times Some foot tingling --mostly at night. No sig pain on the gabapentin  Myositis is controlled fairly well Infusions/prednisone and MTX No limitations  No chest pain  NO SOB  Having some right TMJ tenderness when chewing Not all the time  Current Outpatient Medications on File Prior to Visit  Medication Sig Dispense Refill  . ACCU-CHEK FASTCLIX LANCETS MISC Use to test blood sugar twice daily dx: 250.02 100 each 3  . acetaminophen (TYLENOL) 500 MG tablet Take 500 mg by mouth every 6 (six) hours as needed.    . Alcohol Swabs (B-D SINGLE USE SWABS REGULAR) PADS Use to test blood sugar once daily dx: 250.00 100 each 3  . alendronate (FOSAMAX) 70 MG tablet TAKE 1 TABLET BY MOUTH EVERY 7 DAYS WITHA FULL GLASS OF WATER. (DO NOT LIE DOWN FOR THE NEXT 30 MINUTES) 12 tablet 3  . atorvastatin (LIPITOR) 10 MG tablet TAKE 1 TABLET BY MOUTH ONCE DAILY 90 tablet 3  . Blood Glucose Calibration (ACCU-CHEK AVIVA) SOLN Use as directed 1 each 3  . Cholecalciferol (VITAMIN D) 1000 UNITS capsule Take 1,000 Units by mouth daily.      . folic acid (FOLVITE) 1 MG tablet Take 1 mg by mouth daily.    Marland Kitchen gabapentin (NEURONTIN) 300 MG capsule Take 300 mg by mouth 3 (three) times daily.     Marland Kitchen glucose blood (ACCU-CHEK SMARTVIEW) test strip Use to check blood sugar twice a day Dx Code E11.4 100 each 6  .  insulin glargine (LANTUS) 100 UNIT/ML injection Inject 0.2 mLs (20 Units total) into the skin daily. (Patient taking differently: Inject 20 Units into the skin daily. Sliding Scale) 1 mL 0  . lisinopril (PRINIVIL,ZESTRIL) 10 MG tablet TAKE 1 TABLET BY MOUTH EVERY DAY 90 tablet 1  . metFORMIN (GLUCOPHAGE) 1000 MG tablet TAKE 1 TABLET BY MOUTH TWICE A DAY 180 tablet 3  . Multiple Vitamin (MULTIVITAMIN) tablet Take 1 tablet by mouth daily.      . predniSONE (DELTASONE) 10 MG tablet Take 5 mg by mouth daily.     . methotrexate 250 MG/10ML injection Inject into the skin.    . methotrexate 50 MG/2ML injection Inject into the skin once a week.      No current facility-administered medications on file prior to visit.    Allergies  Allergen Reactions  . Atorvastatin Other (See Comments)    myalgias  . Pravastatin Other (See Comments)    Muscle pain    Past Medical History:  Diagnosis Date  . Allergy   . Asthma   . Diabetes mellitus   . Hyperlipidemia   . Hypertension   . Neuromuscular disorder (HCC)    polymyositis  . Osteoarthritis   . Vitamin B12 deficiency     Past Surgical History:  Procedure Laterality Date  . COLONOSCOPY WITH PROPOFOL N/A 03/07/2016   Procedure: COLONOSCOPY  WITH PROPOFOL;  Surgeon: Lollie Sails, MD;  Location: [facility] ENDOSCOPY;  Service: Endoscopy;  Laterality: N/A;    Family History  Problem Relation Age of Onset  . Heart disease Mother   . Cancer Father   . Breast cancer Neg Hx     Social History   Socioeconomic History  . Marital status: Widowed    Spouse name: Not on file  . Number of children: 2  . Years of education: Not on file  . Highest education level: Not on file  Occupational History  . Occupation: Retired as Training and development officer at [employer]:    Tobacco Use  . Smoking status: Former Research scientist (life sciences)  . Smokeless tobacco: Never Used  . Tobacco comment: age 52 when stopped smoking  Substance and Sexual Activity  . Alcohol  use: No  . Drug use: No  . Sexual activity: Not on file  Other Topics Concern  . Not on file  Social History Narrative   has living will   Son and daughter should make health care decisions for her   Would accept resuscitation   Not sure about tube feeds   Social Determinants of Health   Financial Resource Strain:   . Difficulty of Paying Living Expenses:   Food Insecurity:   . Worried About Charity fundraiser in the Last Year:   . Arboriculturist in the Last Year:   Transportation Needs:   . Film/video editor (Medical):   Marland Kitchen Lack of Transportation (Non-Medical):   Physical Activity:   . Days of Exercise per Week:   . Minutes of Exercise per Session:   Stress:   . Feeling of Stress :   Social Connections:   . Frequency of Communication with Friends and Family:   . Frequency of Social Gatherings with Friends and Family:   . Attends Religious Services:   . Active Member of Clubs or Organizations:   . Attends Archivist Meetings:   Marland Kitchen Marital Status:   Intimate Partner Violence:   . Fear of Current or Ex-Partner:   . Emotionally Abused:   Marland Kitchen Physically Abused:   . Sexually Abused:    Review of Systems  Sleeps well Appetite is good Weight is up some     Objective:   Physical Exam  Constitutional: She appears well-developed. No distress.  HENT:  No TMJ tenderness or popping  Neck: No thyromegaly present.  Cardiovascular: Normal rate, regular rhythm, normal heart sounds and intact distal pulses. Exam reveals no gallop.  No murmur heard. Respiratory: Effort normal and breath sounds normal. No respiratory distress. She has no wheezes. She has no rales.  Musculoskeletal:        General: No edema.  Lymphadenopathy:    She has no cervical adenopathy.  Skin:  No foot lesions           Assessment & Plan:

## 2020-04-23 NOTE — Assessment & Plan Note (Addendum)
Lab Results  Component Value Date   HGBA1C 7.5 (A) 04/23/2020   Control is better now with lower prednisone dose Neuropathy controlled with gabapentin Will cut insulin down to 10

## 2020-04-23 NOTE — Assessment & Plan Note (Signed)
Intermittent No tenderness now Urged her to get dental check up given the alendronate Rx

## 2020-04-23 NOTE — Assessment & Plan Note (Signed)
Doing well on her current Rx

## 2020-04-23 NOTE — Patient Instructions (Signed)
Please cut the insulin down to 10 units every day. Please get a dental exam and x-ray due to the pain in your jaw (and the use of the alendronate)

## 2020-04-23 NOTE — Assessment & Plan Note (Addendum)
BP Readings from Last 3 Encounters:  04/23/20 110/76  04/01/20 (!) 168/81  03/31/20 (!) 148/81   Good control now on lisinopril

## 2020-04-24 ENCOUNTER — Other Ambulatory Visit: Payer: Self-pay | Admitting: Internal Medicine

## 2020-05-03 DIAGNOSIS — M545 Low back pain: Secondary | ICD-10-CM | POA: Diagnosis not present

## 2020-05-03 DIAGNOSIS — E119 Type 2 diabetes mellitus without complications: Secondary | ICD-10-CM | POA: Diagnosis not present

## 2020-05-03 DIAGNOSIS — M3322 Polymyositis with myopathy: Secondary | ICD-10-CM | POA: Diagnosis not present

## 2020-05-03 DIAGNOSIS — G629 Polyneuropathy, unspecified: Secondary | ICD-10-CM | POA: Diagnosis not present

## 2020-05-03 DIAGNOSIS — M26621 Arthralgia of right temporomandibular joint: Secondary | ICD-10-CM | POA: Diagnosis not present

## 2020-05-07 DIAGNOSIS — M8588 Other specified disorders of bone density and structure, other site: Secondary | ICD-10-CM | POA: Diagnosis not present

## 2020-05-11 DIAGNOSIS — M5416 Radiculopathy, lumbar region: Secondary | ICD-10-CM | POA: Diagnosis not present

## 2020-05-11 DIAGNOSIS — E119 Type 2 diabetes mellitus without complications: Secondary | ICD-10-CM | POA: Diagnosis not present

## 2020-05-11 DIAGNOSIS — M3322 Polymyositis with myopathy: Secondary | ICD-10-CM | POA: Diagnosis not present

## 2020-05-12 DIAGNOSIS — G8929 Other chronic pain: Secondary | ICD-10-CM | POA: Diagnosis not present

## 2020-05-12 DIAGNOSIS — M5442 Lumbago with sciatica, left side: Secondary | ICD-10-CM | POA: Diagnosis not present

## 2020-05-12 DIAGNOSIS — M5116 Intervertebral disc disorders with radiculopathy, lumbar region: Secondary | ICD-10-CM | POA: Diagnosis not present

## 2020-05-17 DIAGNOSIS — G8929 Other chronic pain: Secondary | ICD-10-CM | POA: Diagnosis not present

## 2020-05-17 DIAGNOSIS — M5442 Lumbago with sciatica, left side: Secondary | ICD-10-CM | POA: Diagnosis not present

## 2020-05-23 NOTE — Progress Notes (Signed)
[facility]  [address], [address] [place], [place] [address] Phone: [phone]  Fax: [phone]   Clinic Day:  05/25/2020  Referring physician: [name], MD  Chief Complaint: Bridget Romero is a 74 y.o. 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  HPI: The patient was last seen in the hematology clinic on 03/29/2020. At that time, she was doing well.  Her legs felt tight.  CK had increased to 338. Hematocrit was 32.2, hemoglobin 10.0, platelets 248,000, WBC 6,400.  Aldolase was 5.6. She received IVIG x 4 days (03/29/2020 - 04/01/2020).   She was seen by Dr. [name] on 05/03/2020.  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  Aldolase was normal.  Strength was normal.  Plan was to continue her prednisone 5 mg a day, injectable methotrexate 61ml a week and every other month IVIG.  She has a follow-up in 2 months  She was seen by Dr. [name] on 05/11/2020 for an acute office visit.  She had more pain in her left buttock and down the left lateral leg.  She felt that her leg was a little bit weak without numbness.  She was noted to have a ruptured disc L5-L1 on MRI in 2020 on the left; she had an epidural with good improvement.  Polymyositis was stable.  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  She saw Dr. Girtha Hake at the [facility] on 05/17/2020. She had back pain radiating down her left leg. She received a left lumbosacral epidural steroid injection. Patient has follow up in 2 weeks.  During the interim, the patient stated "I'm in pain". She has pain in left leg that radiates from butt down to calf. Patient is curious if the pain is from the pinched nerve or her muscle condition. Tramadol does help some. She last took pain medication at 7 AM and is experiencing increased pain. She feels like the steroid injection from 05/17/2020 has  not kicked in yet.    She reports some left leg tightness in her calf. She continues to have jaw pain but reports that it is manageable. Her strength is better. She will follow up with Dr. [name] in 06/2020.   Patients BP was 172/92 in clinic today.    Past Medical History:  Diagnosis Date  . Allergy   . Asthma   . Diabetes mellitus   . Hyperlipidemia   . Hypertension   . Neuromuscular disorder (HCC)    polymyositis  . Osteoarthritis   . Vitamin B12 deficiency     Past Surgical History:  Procedure Laterality Date  . COLONOSCOPY WITH PROPOFOL N/A 03/07/2016   Procedure: COLONOSCOPY WITH PROPOFOL;  Surgeon: Lollie Sails, MD;  Location: [facility] ENDOSCOPY;  Service: Endoscopy;  Laterality: N/A;    Family History  Problem Relation Age of Onset  . Heart disease Mother   . Cancer Father   . Breast cancer Neg Hx     Social History:  reports that she has quit smoking. She has never used smokeless tobacco. She reports that she does not drink alcohol or use drugs. She is a Training and development officer. She lives in [place]. The patient is alone today.  Allergies:  Allergies  Allergen Reactions  . Atorvastatin Other (See Comments)    myalgias  . Pravastatin Other (See Comments)    Muscle pain    Current Medications: Current Outpatient Medications  Medication Sig  Dispense Refill  . ACCU-CHEK FASTCLIX LANCETS MISC Use to test blood sugar twice daily dx: 250.02 100 each 3  . acetaminophen (TYLENOL) 500 MG tablet Take 500 mg by mouth every 6 (six) hours as needed.    . Alcohol Swabs (B-D SINGLE USE SWABS REGULAR) PADS Use to test blood sugar once daily dx: 250.00 100 each 3  . atorvastatin (LIPITOR) 10 MG tablet TAKE 1 TABLET BY MOUTH ONCE DAILY 90 tablet 3  . B-D TB SYRINGE 1CC/25GX5/8" 25G X 5/8" 1 ML MISC     . Blood Glucose Calibration (ACCU-CHEK AVIVA) SOLN Use as directed 1 each 3  . Cholecalciferol (VITAMIN D) 1000 UNITS capsule Take 1,000 Units by mouth daily.      . folic acid (FOLVITE)  1 MG tablet Take 1 mg by mouth daily.    Marland Kitchen gabapentin (NEURONTIN) 300 MG capsule Take 300 mg by mouth 3 (three) times daily.     Marland Kitchen glucose blood (ACCU-CHEK SMARTVIEW) test strip Use to check blood sugar twice a day Dx Code E11.4 100 each 6  . LANTUS 100 UNIT/ML injection INJECT 10 TO 20 UNITS INTO THE SKIN DAILY AS DIRECTED 10 mL 11  . lisinopril (PRINIVIL,ZESTRIL) 10 MG tablet TAKE 1 TABLET BY MOUTH EVERY DAY 90 tablet 1  . metFORMIN (GLUCOPHAGE) 1000 MG tablet TAKE 1 TABLET BY MOUTH TWICE A DAY 180 tablet 3  . methotrexate 250 MG/10ML injection Inject into the skin.    . methotrexate 50 MG/2ML injection Inject into the skin once a week.     . Multiple Vitamin (MULTIVITAMIN) tablet Take 1 tablet by mouth daily.      . predniSONE (DELTASONE) 10 MG tablet Take 5 mg by mouth daily.     . traMADol (ULTRAM) 50 MG tablet Take by mouth.    Marland Kitchen alendronate (FOSAMAX) 70 MG tablet TAKE 1 TABLET BY MOUTH EVERY 7 DAYS WITHA FULL GLASS OF WATER. (DO NOT LIE DOWN FOR THE NEXT 30 MINUTES) (Patient not taking: Reported on 05/25/2020) 12 tablet 3   No current facility-administered medications for this visit.    Review of Systems  Constitutional: Positive for weight loss (2 lbs). Negative for chills, diaphoresis, fever and malaise/fatigue.       "I'm in pain". Status of strength stable.  HENT: Negative for congestion, ear discharge, ear pain, hearing loss, nosebleeds, sinus pain, sore throat and tinnitus.   Eyes: Negative for blurred vision.       Eyes feel tight and watery with mild itching; improved.   Respiratory: Negative for cough, hemoptysis, sputum production and shortness of breath.   Cardiovascular: Negative for chest pain, palpitations and leg swelling.  Gastrointestinal: Negative for abdominal pain, blood in stool, constipation, diarrhea, heartburn, melena, nausea and vomiting.  Genitourinary: Negative for dysuria, frequency, hematuria and urgency.  Musculoskeletal: Positive for joint pain (jaw  pain). Negative for back pain, myalgias and neck pain.       Legs and calf feel tight.   Skin: Negative for itching and rash.  Neurological: Negative for dizziness, tingling, sensory change, weakness and headaches.  Endo/Heme/Allergies: Does not bruise/bleed easily.       Diabetes on insulin and Metformin.   Psychiatric/Behavioral: Negative for depression and memory loss. The patient is not nervous/anxious and does not have insomnia.   All other systems reviewed and are negative.  Performance status (ECOG): 1  Vitals Blood pressure (!) 172/92, pulse 77, temperature (!) 96.2 F (35.7 C), temperature source Tympanic, weight 192 lb 7.4 oz (  87.3 kg), SpO2 98 %.   Physical Exam  Constitutional: She is oriented to person, place, and time. She appears well-developed and well-nourished. No distress.  HENT:  Head: Normocephalic and atraumatic.  Mouth/Throat: Oropharynx is clear and moist. No oropharyngeal exudate.  Brown cap. Short curly dark hair. Mask.  Eyes: Pupils are equal, round, and reactive to light. Conjunctivae and EOM are normal. No scleral icterus.  Brown eyes.  Cardiovascular: Normal rate, regular rhythm and normal heart sounds.  No murmur heard. Pulmonary/Chest: Effort normal and breath sounds normal. No respiratory distress. She has no wheezes. She has no rales. She exhibits no tenderness.  Abdominal: Soft. Bowel sounds are normal. She exhibits no distension and no mass. There is no abdominal tenderness. There is no rebound and no guarding.  Musculoskeletal:        General: No tenderness or edema. Normal range of motion.     Cervical back: Normal range of motion and neck supple.     Comments: Good strength in BLE.  Lymphadenopathy:    She has no cervical adenopathy.    She has no axillary adenopathy.  Neurological: She is alert and oriented to person, place, and time.  Lower extremity strength 5/5 left and 5/5 right.  Gait is normal.  Able to rise from a chair without using  her arms.   Skin: Skin is warm and dry. She is not diaphoretic.  Psychiatric: She has a normal mood and affect. Her behavior is normal. Judgment and thought content normal.  Nursing note and vitals reviewed.   Appointment on 05/25/2020  Component Date Value Ref Range Status  . WBC 05/25/2020 6.1  4.0 - 10.5 K/uL Final  . RBC 05/25/2020 3.79* 3.87 - 5.11 MIL/uL Final  . Hemoglobin 05/25/2020 10.9* 12.0 - 15.0 g/dL Final  . HCT 05/25/2020 34.5* 36.0 - 46.0 % Final  . MCV 05/25/2020 91.0  80.0 - 100.0 fL Final  . MCH 05/25/2020 28.8  26.0 - 34.0 pg Final  . MCHC 05/25/2020 31.6  30.0 - 36.0 g/dL Final  . RDW 05/25/2020 13.8  11.5 - 15.5 % Final  . Platelets 05/25/2020 271  150 - 400 K/uL Final  . nRBC 05/25/2020 0.0  0.0 - 0.2 % Final  . Neutrophils Relative % 05/25/2020 64  % Final  . Neutro Abs 05/25/2020 3.9  1.7 - 7.7 K/uL Final  . Lymphocytes Relative 05/25/2020 28  % Final  . Lymphs Abs 05/25/2020 1.7  0.7 - 4.0 K/uL Final  . Monocytes Relative 05/25/2020 5  % Final  . Monocytes Absolute 05/25/2020 0.3  0.1 - 1.0 K/uL Final  . Eosinophils Relative 05/25/2020 2  % Final  . Eosinophils Absolute 05/25/2020 0.1  0.0 - 0.5 K/uL Final  . Basophils Relative 05/25/2020 1  % Final  . Basophils Absolute 05/25/2020 0.0  0.0 - 0.1 K/uL Final  . Immature Granulocytes 05/25/2020 0  % Final  . Abs Immature Granulocytes 05/25/2020 0.02  0.00 - 0.07 K/uL Final   Performed at [facility], 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 She receives IVIG monthly,methotrexate weekly, and prednisone5mg  a day.  CKhas been followed: >5555 on 05/30/2017, 4409 on 06/20/2017, 3546 on 07/05/2017, 2725 on 07/30/2017, 3675 on 08/28/2017, 2405 on 10/08/2017, 2743 on 11/08/2017, 2723 on 12/20/2017, 723 on 01/21/2018, 507 on 02/21/2018, 2192 on 04/11/2018, 2227 on 06/04/2018, 4468 on 07/22/2018, 2204 on 09/06/2018, 1360 on  12/02/2018, 1423 on 01/01/2019,912 on 03/03/2019, 3955 on 04/21/2019,4612 on 05/06/2019,4560 on 05/20/2019, 4532 on 06/16/2019,4935 on 07/10/2019,4330 on 07/28/2019, 3645 on 08/13/2019, 2514 on 08/25/2019, 807 on 09/22/2019, 251 on 10/21/2019, 67 on 11/25/2019, 66 on 12/22/2019, < 5 on 01/19/2020, 67 on 02/02/2020, 254 on 03/15/2020, 338 on 03/29/2020, and 847 on 05/25/2020.  Aldolasewas 67.2 in06/05/2017, 69.9 on06/27/2018, 64.8 on07/11/2017,57.6 on08/05/2017, 60.3 on09/03/2017, 38.7 on 12/20/2017, 9.3 on01/28/2019, 7.4 on02/28/2019, 29.1 on04/18/2019, to51.9 on07/29/2019, 23.7 on09/13/2019, 16.4 on 12/02/2018,14.1 on 01/01/2019,8.8 on 03/03/2019, 53.4 on 05/06/2019,42.9 on 05/20/2019, 39.0 in 06/16/2019, 69.3 on 07/28/2019, 37.2 on 08/25/2019, 12.5 on 09/22/2019, 7.2 on 10/21/2019, 5.2 on 11/25/2019, 4.8 on 12/22/2019, 3.9 on 01/19/2020, 4.2 on 02/02/2020, 5.5 on 03/15/2020, 5.6 on 03/29/2020, and 9.1 on 05/25/2020.  She received IVIG (Privigen) 75 gm x 2 days every 4 weeks beginning 12/07/2017.IVIG was decreased to every 8 weeks on 04/22/2019, butthen returned to monthly secondary to rise CK.IVIG wasincreased to 2 gm/kg on08/31/2020,09/23/2019, 10/21/2019, 11/25/2019, 02/02/2020, and 03/29/2020.  She was switched to every 8 weeks.  She has a normocytic anemia. Baseline hemoglobin is 10. Anemia work-upon 12/22/2019 revealed a hematocritwas32.4, hemoglobin 10.0, MCV 92.8, platelets 258,000, WBC 6200, ANC 3700. Reticwas 3.8%.[id number].Coombs was negative. Peripheralsmearrevealed anormocytic anemia. The morphologyof the RBCs, WBCs and platelets were within normal limits.  Last colonoscopy on 03/07/2016 revealed grade I hemorrhoids.She has B12 deficiencyand is on oral B12. B12 was 306 and folate >22.3 on 05/30/2017.   She has chronicmild elevation in LFTswhich have correlated with increased CPK.AST/ALThave been followed:53/54 on 02/17/2019,  107/128 on04/27/2020, 134/200 on05/26/2020,138/261 on 07/10/2019,151/304 on08/02/2019, 175/289 on 08/13/2019, 102/196 on 08/25/2019, 47/75 on 09/22/2019, 12/23 on 11/25/2019, 19/16 on 12/22/2019, 20/16 on 01/19/2020, 19/15 on 02/02/2020, 22/17 on 03/15/2020, 25/19 on 03/29/2020, and 40/36 on 05/25/2020. Hepatitis B and C testing werenegative on 06/27/2018and 07/28/2019.  Symptomatically, she has pain in her left leg that radiates down from her buttocks.  She denies any muscle weakness.  Plan: 1.   Labs today: CBC with diff, CMP, CK, aldolase. 2.Polymyositis Clinically, she is doing well without recurrent weakness on IVIG to 2 gm/kg monthly. She is currently on prednisone5 mg/day and MTX10mg SQweekly. CKis elevated (847). Aldolaseis normal(9.1). Discuss increased CK with Dr. Jodean Lima IVIG every 6 weeks instead of every 8 weeks.  Begin IVIG over next 4 days. 3.Elevated LFTs, resolved AST 40and ALT 36. Increase in liver function test correlates with increased CPK. CPK reflects activityofpolymyositis. Continue to monitor prior to each infusion. 4.Normocytic anemia Hematocrit37.6. Hemoglobin 11.8. MCV91.7on 01/19/2020.  Hematocrit 34.5.  Hemoglobin 10.9.  MCV 91.0 on 05/25/2020. Ferritin205 with an iron sat 40% and a TIBC 278 on 11/25/2019. [id number] andfolate35.0. Reticwas 3.8%. Coombsnegative [id number]. Continue to monitor. 5. RTC in 6 weeks for MD assessment, labs (CBC with diff, CMP, CK, aldolase), and IVIG over 4 days.  I discussed the assessment and treatment plan with the patient.  The patient was provided an opportunity to ask questions and all were answered.  The patient agreed with the plan and demonstrated an  understanding of the instructions.  The patient was advised to call back if the symptoms worsen or if the condition fails to improve as anticipated.    Lequita Asal, MD, PhD    05/25/2020, 9:28 AM  I, Selena Batten, am acting as scribe for [employer]. Mike Gip, MD, PhD.  I, Rafael Salway C. Mike Gip, MD, have reviewed the above documentation for accuracy and completeness, and I agree with the above.

## 2020-05-25 ENCOUNTER — Inpatient Hospital Stay: Payer: Medicare PPO

## 2020-05-25 ENCOUNTER — Inpatient Hospital Stay: Payer: Medicare PPO | Attending: Hematology and Oncology | Admitting: Hematology and Oncology

## 2020-05-25 ENCOUNTER — Other Ambulatory Visit: Payer: Self-pay

## 2020-05-25 ENCOUNTER — Encounter: Payer: Self-pay | Admitting: Hematology and Oncology

## 2020-05-25 VITALS — BP 172/92 | HR 77 | Temp 96.2°F | Wt 192.5 lb

## 2020-05-25 VITALS — BP 153/71 | HR 74 | Temp 97.6°F | Resp 18

## 2020-05-25 DIAGNOSIS — M3320 Polymyositis, organ involvement unspecified: Secondary | ICD-10-CM

## 2020-05-25 DIAGNOSIS — D649 Anemia, unspecified: Secondary | ICD-10-CM | POA: Diagnosis not present

## 2020-05-25 LAB — CBC WITH DIFFERENTIAL/PLATELET
Abs Immature Granulocytes: 0.02 10*3/uL (ref 0.00–0.07)
Basophils Absolute: 0 10*3/uL (ref 0.0–0.1)
Basophils Relative: 1 %
Eosinophils Absolute: 0.1 10*3/uL (ref 0.0–0.5)
Eosinophils Relative: 2 %
HCT: 34.5 % — ABNORMAL LOW (ref 36.0–46.0)
Hemoglobin: 10.9 g/dL — ABNORMAL LOW (ref 12.0–15.0)
Immature Granulocytes: 0 %
Lymphocytes Relative: 28 %
Lymphs Abs: 1.7 10*3/uL (ref 0.7–4.0)
MCH: 28.8 pg (ref 26.0–34.0)
MCHC: 31.6 g/dL (ref 30.0–36.0)
MCV: 91 fL (ref 80.0–100.0)
Monocytes Absolute: 0.3 10*3/uL (ref 0.1–1.0)
Monocytes Relative: 5 %
Neutro Abs: 3.9 10*3/uL (ref 1.7–7.7)
Neutrophils Relative %: 64 %
Platelets: 271 10*3/uL (ref 150–400)
RBC: 3.79 MIL/uL — ABNORMAL LOW (ref 3.87–5.11)
RDW: 13.8 % (ref 11.5–15.5)
WBC: 6.1 10*3/uL (ref 4.0–10.5)
nRBC: 0 % (ref 0.0–0.2)

## 2020-05-25 LAB — COMPREHENSIVE METABOLIC PANEL
ALT: 36 U/L (ref 0–44)
AST: 40 U/L (ref 15–41)
Albumin: 3.9 g/dL (ref 3.5–5.0)
Alkaline Phosphatase: 43 U/L (ref 38–126)
Anion gap: 8 (ref 5–15)
BUN: 19 mg/dL (ref 8–23)
CO2: 25 mmol/L (ref 22–32)
Calcium: 9.5 mg/dL (ref 8.9–10.3)
Chloride: 104 mmol/L (ref 98–111)
Creatinine, Ser: 0.67 mg/dL (ref 0.44–1.00)
GFR calc Af Amer: >60 mL/min (ref >60)
GFR calc non Af Amer: >60 mL/min (ref >60)
Glucose, Bld: 157 mg/dL — ABNORMAL HIGH (ref 70–99)
Potassium: 3.8 mmol/L (ref 3.5–5.1)
Sodium: 137 mmol/L (ref 135–145)
Total Bilirubin: 0.8 mg/dL (ref 0.3–1.2)
Total Protein: 7.3 g/dL (ref 6.5–8.1)

## 2020-05-25 LAB — CK: Total CK: 847 U/L — ABNORMAL HIGH (ref 38–234)

## 2020-05-25 MED ORDER — DEXTROSE 5 % IV SOLN
INTRAVENOUS | Status: DC
  Filled 2020-05-25: qty 250

## 2020-05-25 MED ORDER — METHYLPREDNISOLONE SODIUM SUCC 125 MG IJ SOLR
40.0000 mg | INTRAMUSCULAR | Status: DC
  Administered 2020-05-25: 40 mg via INTRAVENOUS
  Filled 2020-05-25: qty 2

## 2020-05-25 MED ORDER — IMMUNE GLOBULIN (HUMAN) 20 GM/200ML IV SOLN
40.0000 g | INTRAVENOUS | Status: DC
  Administered 2020-05-25: 40 g via INTRAVENOUS
  Filled 2020-05-25: qty 400

## 2020-05-25 MED ORDER — ACETAMINOPHEN 325 MG PO TABS
650.0000 mg | ORAL_TABLET | Freq: Once | ORAL | Status: AC
  Administered 2020-05-25: 650 mg via ORAL
  Filled 2020-05-25: qty 2

## 2020-05-25 MED ORDER — DIPHENHYDRAMINE HCL 25 MG PO CAPS
ORAL_CAPSULE | ORAL | Status: AC
  Filled 2020-05-25: qty 1

## 2020-05-25 MED ORDER — DIPHENHYDRAMINE HCL 25 MG PO TABS
25.0000 mg | ORAL_TABLET | Freq: Once | ORAL | Status: AC
  Administered 2020-05-25: 25 mg via ORAL
  Filled 2020-05-25: qty 1

## 2020-05-25 NOTE — Progress Notes (Signed)
Pain in left leg that radiates from butt down to calf. Patient is curious if the pain is from the pinched nerve or her muscle condition. Tramadol does help some.

## 2020-05-26 ENCOUNTER — Inpatient Hospital Stay: Payer: Medicare PPO

## 2020-05-26 VITALS — BP 136/81 | HR 74 | Temp 98.0°F | Resp 18

## 2020-05-26 DIAGNOSIS — M3320 Polymyositis, organ involvement unspecified: Secondary | ICD-10-CM | POA: Diagnosis not present

## 2020-05-26 LAB — ALDOLASE: Aldolase: 9.1 U/L (ref 3.3–10.3)

## 2020-05-26 MED ORDER — DEXTROSE 5 % IV SOLN
INTRAVENOUS | Status: DC
  Filled 2020-05-26: qty 250

## 2020-05-26 MED ORDER — METHYLPREDNISOLONE SODIUM SUCC 125 MG IJ SOLR
40.0000 mg | INTRAMUSCULAR | Status: DC
  Administered 2020-05-26: 40 mg via INTRAVENOUS
  Filled 2020-05-26: qty 2

## 2020-05-26 MED ORDER — ACETAMINOPHEN 325 MG PO TABS
650.0000 mg | ORAL_TABLET | Freq: Once | ORAL | Status: AC
  Administered 2020-05-26: 650 mg via ORAL
  Filled 2020-05-26: qty 2

## 2020-05-26 MED ORDER — DIPHENHYDRAMINE HCL 25 MG PO CAPS
25.0000 mg | ORAL_CAPSULE | Freq: Once | ORAL | Status: AC
  Administered 2020-05-26: 25 mg via ORAL
  Filled 2020-05-26: qty 1

## 2020-05-26 MED ORDER — IMMUNE GLOBULIN (HUMAN) 20 GM/200ML IV SOLN
40.0000 g | INTRAVENOUS | Status: DC
  Administered 2020-05-26: 40 g via INTRAVENOUS
  Filled 2020-05-26: qty 400

## 2020-05-27 ENCOUNTER — Other Ambulatory Visit: Payer: Self-pay

## 2020-05-27 ENCOUNTER — Inpatient Hospital Stay: Payer: Medicare PPO

## 2020-05-27 VITALS — BP 142/69 | HR 79 | Temp 97.3°F | Resp 18

## 2020-05-27 DIAGNOSIS — M3320 Polymyositis, organ involvement unspecified: Secondary | ICD-10-CM | POA: Diagnosis not present

## 2020-05-27 MED ORDER — DEXTROSE 5 % IV SOLN
Freq: Once | INTRAVENOUS | Status: AC
  Filled 2020-05-27: qty 250

## 2020-05-27 MED ORDER — DIPHENHYDRAMINE HCL 25 MG PO TABS
25.0000 mg | ORAL_TABLET | Freq: Once | ORAL | Status: AC
  Administered 2020-05-27: 25 mg via ORAL
  Filled 2020-05-27: qty 1

## 2020-05-27 MED ORDER — IMMUNE GLOBULIN (HUMAN) 20 GM/200ML IV SOLN
40.0000 g | INTRAVENOUS | Status: DC
  Administered 2020-05-27: 40 g via INTRAVENOUS
  Filled 2020-05-27: qty 400

## 2020-05-27 MED ORDER — DIPHENHYDRAMINE HCL 25 MG PO CAPS
ORAL_CAPSULE | ORAL | Status: AC
  Filled 2020-05-27: qty 1

## 2020-05-27 MED ORDER — METHYLPREDNISOLONE SODIUM SUCC 125 MG IJ SOLR
40.0000 mg | INTRAMUSCULAR | Status: DC
  Administered 2020-05-27: 40 mg via INTRAVENOUS
  Filled 2020-05-27: qty 2

## 2020-05-27 MED ORDER — ACETAMINOPHEN 325 MG PO TABS
650.0000 mg | ORAL_TABLET | Freq: Once | ORAL | Status: AC
  Administered 2020-05-27: 650 mg via ORAL
  Filled 2020-05-27: qty 2

## 2020-05-27 NOTE — Progress Notes (Signed)
Pt tolerated infusion well. Pt and VS stable at discharge. No s/s of distress or reaction noted.

## 2020-05-28 ENCOUNTER — Inpatient Hospital Stay: Payer: Medicare PPO

## 2020-05-28 VITALS — BP 148/75 | HR 78 | Temp 97.0°F | Resp 18

## 2020-05-28 DIAGNOSIS — M3320 Polymyositis, organ involvement unspecified: Secondary | ICD-10-CM

## 2020-05-28 MED ORDER — DEXTROSE 5 % IV SOLN
INTRAVENOUS | Status: DC
  Filled 2020-05-28: qty 250

## 2020-05-28 MED ORDER — ACETAMINOPHEN 325 MG PO TABS
650.0000 mg | ORAL_TABLET | Freq: Once | ORAL | Status: AC
  Administered 2020-05-28: 650 mg via ORAL
  Filled 2020-05-28: qty 2

## 2020-05-28 MED ORDER — DIPHENHYDRAMINE HCL 25 MG PO CAPS
ORAL_CAPSULE | ORAL | Status: AC
  Filled 2020-05-28: qty 1

## 2020-05-28 MED ORDER — IMMUNE GLOBULIN (HUMAN) 20 GM/200ML IV SOLN
500.0000 mg/kg | INTRAVENOUS | Status: DC
  Filled 2020-05-28: qty 450

## 2020-05-28 MED ORDER — DIPHENHYDRAMINE HCL 25 MG PO TABS
25.0000 mg | ORAL_TABLET | Freq: Once | ORAL | Status: AC
  Administered 2020-05-28: 25 mg via ORAL
  Filled 2020-05-28: qty 1

## 2020-05-28 MED ORDER — METHYLPREDNISOLONE SODIUM SUCC 125 MG IJ SOLR
40.0000 mg | INTRAMUSCULAR | Status: DC
  Administered 2020-05-28: 40 mg via INTRAVENOUS
  Filled 2020-05-28: qty 2

## 2020-05-28 MED ORDER — IMMUNE GLOBULIN (HUMAN) 20 GM/200ML IV SOLN
40.0000 g | INTRAVENOUS | Status: DC
  Administered 2020-05-28: 40 g via INTRAVENOUS
  Filled 2020-05-28: qty 400

## 2020-06-01 DIAGNOSIS — G8929 Other chronic pain: Secondary | ICD-10-CM | POA: Diagnosis not present

## 2020-06-01 DIAGNOSIS — M5442 Lumbago with sciatica, left side: Secondary | ICD-10-CM | POA: Diagnosis not present

## 2020-06-01 DIAGNOSIS — M5116 Intervertebral disc disorders with radiculopathy, lumbar region: Secondary | ICD-10-CM | POA: Diagnosis not present

## 2020-07-01 DIAGNOSIS — M79662 Pain in left lower leg: Secondary | ICD-10-CM | POA: Diagnosis not present

## 2020-07-01 DIAGNOSIS — E119 Type 2 diabetes mellitus without complications: Secondary | ICD-10-CM | POA: Diagnosis not present

## 2020-07-01 DIAGNOSIS — M3322 Polymyositis with myopathy: Secondary | ICD-10-CM | POA: Diagnosis not present

## 2020-07-01 DIAGNOSIS — M5416 Radiculopathy, lumbar region: Secondary | ICD-10-CM | POA: Diagnosis not present

## 2020-07-05 ENCOUNTER — Inpatient Hospital Stay (HOSPITAL_BASED_OUTPATIENT_CLINIC_OR_DEPARTMENT_OTHER): Payer: Medicare PPO | Admitting: Nurse Practitioner

## 2020-07-05 ENCOUNTER — Other Ambulatory Visit: Payer: Self-pay

## 2020-07-05 ENCOUNTER — Inpatient Hospital Stay: Payer: Medicare PPO

## 2020-07-05 ENCOUNTER — Inpatient Hospital Stay: Payer: Medicare PPO | Attending: Nurse Practitioner

## 2020-07-05 ENCOUNTER — Encounter: Payer: Self-pay | Admitting: Nurse Practitioner

## 2020-07-05 ENCOUNTER — Other Ambulatory Visit: Payer: Self-pay | Admitting: Hematology and Oncology

## 2020-07-05 VITALS — BP 160/89 | HR 82 | Temp 97.0°F | Resp 18

## 2020-07-05 VITALS — BP 140/76 | HR 86 | Temp 97.0°F | Wt 194.0 lb

## 2020-07-05 DIAGNOSIS — D649 Anemia, unspecified: Secondary | ICD-10-CM

## 2020-07-05 DIAGNOSIS — M3320 Polymyositis, organ involvement unspecified: Secondary | ICD-10-CM | POA: Insufficient documentation

## 2020-07-05 DIAGNOSIS — R7989 Other specified abnormal findings of blood chemistry: Secondary | ICD-10-CM

## 2020-07-05 LAB — COMPREHENSIVE METABOLIC PANEL
ALT: 49 U/L — ABNORMAL HIGH (ref 0–44)
AST: 42 U/L — ABNORMAL HIGH (ref 15–41)
Albumin: 4 g/dL (ref 3.5–5.0)
Alkaline Phosphatase: 39 U/L (ref 38–126)
Anion gap: 10 (ref 5–15)
BUN: 18 mg/dL (ref 8–23)
CO2: 25 mmol/L (ref 22–32)
Calcium: 9.5 mg/dL (ref 8.9–10.3)
Chloride: 105 mmol/L (ref 98–111)
Creatinine, Ser: 0.76 mg/dL (ref 0.44–1.00)
GFR calc Af Amer: >60 mL/min (ref >60)
GFR calc non Af Amer: >60 mL/min (ref >60)
Glucose, Bld: 187 mg/dL — ABNORMAL HIGH (ref 70–99)
Potassium: 4.1 mmol/L (ref 3.5–5.1)
Sodium: 140 mmol/L (ref 135–145)
Total Bilirubin: 1 mg/dL (ref 0.3–1.2)
Total Protein: 7.1 g/dL (ref 6.5–8.1)

## 2020-07-05 LAB — CBC WITH DIFFERENTIAL/PLATELET
Abs Immature Granulocytes: 0.05 10*3/uL (ref 0.00–0.07)
Basophils Absolute: 0.1 10*3/uL (ref 0.0–0.1)
Basophils Relative: 0 %
Eosinophils Absolute: 0.2 10*3/uL (ref 0.0–0.5)
Eosinophils Relative: 2 %
HCT: 31.2 % — ABNORMAL LOW (ref 36.0–46.0)
Hemoglobin: 10.1 g/dL — ABNORMAL LOW (ref 12.0–15.0)
Immature Granulocytes: 0 %
Lymphocytes Relative: 13 %
Lymphs Abs: 1.5 10*3/uL (ref 0.7–4.0)
MCH: 29 pg (ref 26.0–34.0)
MCHC: 32.4 g/dL (ref 30.0–36.0)
MCV: 89.7 fL (ref 80.0–100.0)
Monocytes Absolute: 0.4 10*3/uL (ref 0.1–1.0)
Monocytes Relative: 4 %
Neutro Abs: 9 10*3/uL — ABNORMAL HIGH (ref 1.7–7.7)
Neutrophils Relative %: 81 %
Platelets: 304 10*3/uL (ref 150–400)
RBC: 3.48 MIL/uL — ABNORMAL LOW (ref 3.87–5.11)
RDW: 14.9 % (ref 11.5–15.5)
WBC: 11.2 10*3/uL — ABNORMAL HIGH (ref 4.0–10.5)
nRBC: 0 % (ref 0.0–0.2)

## 2020-07-05 LAB — CK: Total CK: 854 U/L — ABNORMAL HIGH (ref 38–234)

## 2020-07-05 MED ORDER — DIPHENHYDRAMINE HCL 25 MG PO CAPS
ORAL_CAPSULE | ORAL | Status: AC
  Filled 2020-07-05: qty 1

## 2020-07-05 MED ORDER — IMMUNE GLOBULIN (HUMAN) 20 GM/200ML IV SOLN
40.0000 g | INTRAVENOUS | Status: DC
  Administered 2020-07-05: 40 g via INTRAVENOUS
  Filled 2020-07-05: qty 400

## 2020-07-05 MED ORDER — DEXTROSE 5 % IV SOLN
INTRAVENOUS | Status: DC
  Filled 2020-07-05: qty 250

## 2020-07-05 MED ORDER — ACETAMINOPHEN 325 MG PO TABS
650.0000 mg | ORAL_TABLET | Freq: Once | ORAL | Status: AC
  Administered 2020-07-05: 650 mg via ORAL
  Filled 2020-07-05: qty 2

## 2020-07-05 MED ORDER — DIPHENHYDRAMINE HCL 25 MG PO TABS
25.0000 mg | ORAL_TABLET | Freq: Once | ORAL | Status: AC
  Administered 2020-07-05: 25 mg via ORAL
  Filled 2020-07-05: qty 1

## 2020-07-05 MED ORDER — METHYLPREDNISOLONE SODIUM SUCC 125 MG IJ SOLR
40.0000 mg | INTRAMUSCULAR | Status: DC
  Administered 2020-07-05: 40 mg via INTRAVENOUS
  Filled 2020-07-05: qty 2

## 2020-07-05 NOTE — Progress Notes (Signed)
[facility]  [address], [address] [place], [place] [address] Phone: [phone]  Fax: [phone]   Clinic Day:  07/05/2020  Referring physician: [name], MD  Chief Complaint: Bridget Romero is a 74 y.o. female with polymyositis who returns to clinic for reevaluation and consideration of continuation of IVIG.  HPI: Patient was last seen by Dr. Mike Gip on 05/25/2020.  She was seen by Dr. [name] on 07/01/2020.  She continues to have left her lateral leg pain, left buttock pain, and left calf pain.  She has a known herniated disc.  No significant numbness or tingling.  She has had an epidural but did not feel that it helped significantly.  She is followed by physiatry.  Currently taking a prednisone taper.  Took naproxen for pain this morning.  CPK has been elevated.  Has some left lower quadrant pain.  Has been tolerating IVIG well without significant side effects or adverse reactions.    Past Medical History:  Diagnosis Date  . Allergy   . Asthma   . Diabetes mellitus   . Hyperlipidemia   . Hypertension   . Neuromuscular disorder (HCC)    polymyositis  . Osteoarthritis   . Vitamin B12 deficiency     Past Surgical History:  Procedure Laterality Date  . COLONOSCOPY WITH PROPOFOL N/A 03/07/2016   Procedure: COLONOSCOPY WITH PROPOFOL;  Surgeon: Lollie Sails, MD;  Location: [facility] ENDOSCOPY;  Service: Endoscopy;  Laterality: N/A;    Family History  Problem Relation Age of Onset  . Heart disease Mother   . Cancer Father   . Breast cancer Neg Hx     Social History:  reports that she has quit smoking. She has never used smokeless tobacco. She reports that she does not drink alcohol and does not use drugs. She is a Training and development officer. She lives in [place]. The patient is alone today.  Allergies:  Allergies  Allergen Reactions  . Atorvastatin Other (See Comments)    myalgias  . Pravastatin Other (See Comments)    Muscle pain    Current  Medications: Current Outpatient Medications  Medication Sig Dispense Refill  . ACCU-CHEK FASTCLIX LANCETS MISC Use to test blood sugar twice daily dx: 250.02 100 each 3  . acetaminophen (TYLENOL) 500 MG tablet Take 500 mg by mouth every 6 (six) hours as needed.    . Alcohol Swabs (B-D SINGLE USE SWABS REGULAR) PADS Use to test blood sugar once daily dx: 250.00 100 each 3  . atorvastatin (LIPITOR) 10 MG tablet TAKE 1 TABLET BY MOUTH ONCE DAILY 90 tablet 3  . B-D TB SYRINGE 1CC/25GX5/8" 25G X 5/8" 1 ML MISC     . Blood Glucose Calibration (ACCU-CHEK AVIVA) SOLN Use as directed 1 each 3  . Cholecalciferol (VITAMIN D) 1000 UNITS capsule Take 1,000 Units by mouth daily.      . diclofenac (FLECTOR) 1.3 % PTCH Apply patch to the most painful area.    . folic acid (FOLVITE) 1 MG tablet Take 1 mg by mouth daily.    Marland Kitchen gabapentin (NEURONTIN) 300 MG capsule Take 300 mg by mouth 3 (three) times daily.     Marland Kitchen glucose blood (ACCU-CHEK SMARTVIEW) test strip Use to check blood sugar twice a day Dx Code E11.4 100 each 6  . LANTUS 100 UNIT/ML injection INJECT 10 TO 20 UNITS INTO THE SKIN DAILY AS DIRECTED 10 mL 11  . lisinopril (PRINIVIL,ZESTRIL) 10 MG tablet TAKE 1 TABLET BY MOUTH EVERY DAY 90  tablet 1  . metFORMIN (GLUCOPHAGE) 1000 MG tablet TAKE 1 TABLET BY MOUTH TWICE A DAY 180 tablet 3  . methotrexate 250 MG/10ML injection Inject into the skin.    . methotrexate 50 MG/2ML injection Inject into the skin once a week.     . Multiple Vitamin (MULTIVITAMIN) tablet Take 1 tablet by mouth daily.      . predniSONE (DELTASONE) 5 MG tablet 6 pills x1day, 5x1,4x1,3x1,2x1,1x1    . tiZANidine (ZANAFLEX) 2 MG tablet Take by mouth.    Marland Kitchen alendronate (FOSAMAX) 70 MG tablet TAKE 1 TABLET BY MOUTH EVERY 7 DAYS WITHA FULL GLASS OF WATER. (DO NOT LIE DOWN FOR THE NEXT 30 MINUTES) (Patient not taking: Reported on 05/25/2020) 12 tablet 3  . predniSONE (DELTASONE) 10 MG tablet Take 5 mg by mouth daily.  (Patient not taking: Reported  on 07/05/2020)    . traMADol (ULTRAM) 50 MG tablet Take by mouth. (Patient not taking: Reported on 07/05/2020)     No current facility-administered medications for this visit.    Review of Systems  Constitutional: Positive for malaise/fatigue. Negative for chills, fever and weight loss.  HENT: Negative for hearing loss, nosebleeds, sore throat and tinnitus.   Eyes: Negative for blurred vision and double vision.  Respiratory: Negative for cough, hemoptysis, shortness of breath and wheezing.   Cardiovascular: Negative for chest pain, palpitations and leg swelling.  Gastrointestinal: Negative for abdominal pain, blood in stool, constipation, diarrhea, melena, nausea and vomiting.  Genitourinary: Negative for dysuria and urgency.  Musculoskeletal: Positive for joint pain and myalgias. Negative for back pain and falls.  Skin: Negative for itching and rash.  Neurological: Negative for dizziness, tingling, sensory change, loss of consciousness, weakness and headaches.  Endo/Heme/Allergies: Negative for environmental allergies. Does not bruise/bleed easily.  Psychiatric/Behavioral: Negative for depression. The patient is nervous/anxious. The patient does not have insomnia.    Performance status (ECOG): 1  Vitals Blood pressure 140/76, pulse 86, temperature (!) 97 F (36.1 C), temperature source Tympanic, weight 194 lb 0.1 oz (88 kg), SpO2 100 %.   Physical Exam Vitals and nursing note reviewed.  Constitutional:      General: She is not in acute distress.    Appearance: She is well-developed. She is not diaphoretic.  HENT:     Head: Normocephalic and atraumatic.     Mouth/Throat:     Pharynx: No oropharyngeal exudate.  Eyes:     General: No scleral icterus.    Conjunctiva/sclera: Conjunctivae normal.     Pupils: Pupils are equal, round, and reactive to light.     Comments: Brown eyes.  Cardiovascular:     Rate and Rhythm: Normal rate and regular rhythm.     Heart sounds: Normal heart  sounds. No murmur heard.   Pulmonary:     Effort: Pulmonary effort is normal. No respiratory distress.     Breath sounds: Normal breath sounds. No wheezing or rales.  Chest:     Chest wall: No tenderness.  Abdominal:     General: Bowel sounds are normal. There is no distension.     Palpations: Abdomen is soft. There is no mass.     Tenderness: There is no abdominal tenderness. There is no guarding or rebound.  Musculoskeletal:        General: No tenderness. Normal range of motion.     Cervical back: Normal range of motion and neck supple.     Comments: Good strength in BLE.  Lymphadenopathy:     Cervical: No  cervical adenopathy.  Skin:    General: Skin is warm and dry.  Neurological:     Mental Status: She is alert and oriented to person, place, and time.  Psychiatric:        Behavior: Behavior normal.        Thought Content: Thought content normal.        Judgment: Judgment normal.     Appointment on 07/05/2020  Component Date Value Ref Range Status  . Total CK 07/05/2020 854* 38.0 - 234.0 U/L Final   Performed at [facility], [address], [place], [place] [address]  . Sodium 07/05/2020 140  135 - 145 mmol/L Final  . Potassium 07/05/2020 4.1  3.5 - 5.1 mmol/L Final  . Chloride 07/05/2020 105  98 - 111 mmol/L Final  . CO2 07/05/2020 25  22 - 32 mmol/L Final  . Glucose, Bld 07/05/2020 187* 70 - 99 mg/dL Final   Glucose reference range applies only to samples taken after fasting for at least 8 hours.  . BUN 07/05/2020 18  8 - 23 mg/dL Final  . Creatinine, Ser 07/05/2020 0.76  0.44 - 1.00 mg/dL Final  . Calcium 07/05/2020 9.5  8.9 - 10.3 mg/dL Final  . Total Protein 07/05/2020 7.1  6.5 - 8.1 g/dL Final  . Albumin 07/05/2020 4.0  3.5 - 5.0 g/dL Final  . AST 07/05/2020 42* 15 - 41 U/L Final  . ALT 07/05/2020 49* 0 - 44 U/L Final  . Alkaline Phosphatase 07/05/2020 39  38 - 126 U/L Final  . Total Bilirubin 07/05/2020 1.0  0.3 - 1.2 mg/dL Final  . GFR calc non  Af Amer 07/05/2020 >60  >60 mL/min Final  . GFR calc Af Amer 07/05/2020 >60  >60 mL/min Final  . Anion gap 07/05/2020 10  5 - 15 Final   Performed at [facility], [address] [place], [place] [address]  . WBC 07/05/2020 11.2* 4.0 - 10.5 K/uL Final  . RBC 07/05/2020 3.48* 3.87 - 5.11 MIL/uL Final  . Hemoglobin 07/05/2020 10.1* 12.0 - 15.0 g/dL Final  . HCT 07/05/2020 31.2* 36 - 46 % Final  . MCV 07/05/2020 89.7  80.0 - 100.0 fL Final  . MCH 07/05/2020 29.0  26.0 - 34.0 pg Final  . MCHC 07/05/2020 32.4  30.0 - 36.0 g/dL Final  . RDW 07/05/2020 14.9  11.5 - 15.5 % Final  . Platelets 07/05/2020 304  150 - 400 K/uL Final  . nRBC 07/05/2020 0.0  0.0 - 0.2 % Final  . Neutrophils Relative % 07/05/2020 81  % Final  . Neutro Abs 07/05/2020 9.0* 1.7 - 7.7 K/uL Final  . Lymphocytes Relative 07/05/2020 13  % Final  . Lymphs Abs 07/05/2020 1.5  0.7 - 4.0 K/uL Final  . Monocytes Relative 07/05/2020 4  % Final  . Monocytes Absolute 07/05/2020 0.4  0 - 1 K/uL Final  . Eosinophils Relative 07/05/2020 2  % Final  . Eosinophils Absolute 07/05/2020 0.2  0 - 0 K/uL Final  . Basophils Relative 07/05/2020 0  % Final  . Basophils Absolute 07/05/2020 0.1  0 - 0 K/uL Final  . Immature Granulocytes 07/05/2020 0  % Final  . Abs Immature Granulocytes 07/05/2020 0.05  0.00 - 0.07 K/uL Final   Performed at [facility], [address], [place], [place] [address]    Assessment:  Bridget Romero is a 74 y.o. 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 She receives IVIG monthly,methotrexate weekly, and prednisone5mg   a day.  CKhas been followed: >5555 on 05/30/2017, 4409 on 06/20/2017, 3546 on 07/05/2017, 2725 on 07/30/2017, 3675 on 08/28/2017, 2405 on 10/08/2017, 2743 on 11/08/2017, 2723 on 12/20/2017, 723 on 01/21/2018, 507 on 02/21/2018, 2192 on 04/11/2018, 2227 on 06/04/2018, 4468 on 07/22/2018, 2204 on 09/06/2018, 1360 on 12/02/2018, 1423 on 01/01/2019,912 on 03/03/2019, 3955 on  04/21/2019,4612 on 05/06/2019,4560 on 05/20/2019, 4532 on 06/16/2019,4935 on 07/10/2019,4330 on 07/28/2019, 3645 on 08/13/2019, 2514 on 08/25/2019, 807 on 09/22/2019, 251 on 10/21/2019, 67 on 11/25/2019, 66 on 12/22/2019, < 5 on 01/19/2020, 67 on 02/02/2020, 254 on 03/15/2020, 338 on 03/29/2020, and 847 on 05/25/2020.  Aldolasewas 67.2 in06/05/2017, 69.9 on06/27/2018, 64.8 on07/11/2017,57.6 on08/05/2017, 60.3 on09/03/2017, 38.7 on 12/20/2017, 9.3 on01/28/2019, 7.4 on02/28/2019, 29.1 on04/18/2019, to51.9 on07/29/2019, 23.7 on09/13/2019, 16.4 on 12/02/2018,14.1 on 01/01/2019,8.8 on 03/03/2019, 53.4 on 05/06/2019,42.9 on 05/20/2019, 39.0 in 06/16/2019, 69.3 on 07/28/2019, 37.2 on 08/25/2019, 12.5 on 09/22/2019, 7.2 on 10/21/2019, 5.2 on 11/25/2019, 4.8 on 12/22/2019, 3.9 on 01/19/2020, 4.2 on 02/02/2020, 5.5 on 03/15/2020, 5.6 on 03/29/2020, and 9.1 on 05/25/2020.  She received IVIG (Privigen) 75 gm x 2 days every 4 weeks beginning 12/07/2017.IVIG was decreased to every 8 weeks on 04/22/2019, butthen returned to monthly secondary to rise CK.IVIG wasincreased to 2 gm/kg on08/31/2020,09/23/2019, 10/21/2019, 11/25/2019, 02/02/2020, and 03/29/2020.  She was switched to every 8 weeks.  She has a normocytic anemia. Baseline hemoglobin is 10. Anemia work-upon 12/22/2019 revealed a hematocritwas32.4, hemoglobin 10.0, MCV 92.8, platelets 258,000, WBC 6200, ANC 3700. Reticwas 3.8%.[id number].Coombs was negative. Peripheralsmearrevealed anormocytic anemia. The morphologyof the RBCs, WBCs and platelets were within normal limits.  Last colonoscopy on 03/07/2016 revealed grade I hemorrhoids.She has B12 deficiencyand is on oral B12. B12 was 306 and folate >22.3 on 05/30/2017.   She has chronicmild elevation in LFTswhich have correlated with increased CPK.AST/ALThave been followed:53/54 on 02/17/2019, 107/128 on04/27/2020, 134/200 on05/26/2020,138/261 on  07/10/2019,151/304 on08/02/2019, 175/289 on 08/13/2019, 102/196 on 08/25/2019, 47/75 on 09/22/2019, 12/23 on 11/25/2019, 19/16 on 12/22/2019, 20/16 on 01/19/2020, 19/15 on 02/02/2020, 22/17 on 03/15/2020, 25/19 on 03/29/2020, and 40/36 on 05/25/2020. Hepatitis B and C testing werenegative on 06/27/2018and 07/28/2019.  Symptomatically she is stable.  No muscle weakness.  Plan: 1.   Labs today: CBC with diff, CMP, CK, aldolase. 2.Polymyositis  - Clinically doing well without recurrent weakness.  Increased CK was previously discussed with Dr. [name] with recommendation to increase frequency of IVIG to every 6 weeks as opposed to every 8 weeks.  IVIG given every 4 days.  Continue prednisone and methotrexate per rheumatology.  -Plan for IVIG today with plan for 4 days of treatment.  3.Elevated LFTs,   Increase in liver function tests correlate with increased CPK  CPK reflects activity of polymyositis.  AST 42 and ALT 49  Discussed getting an ultrasound however, hold as this is likely reflective of increased CPK.  Recommend continuing to monitor, avoidance of hepatotoxic medications since.  Continue IVIG.  No dose reductions necessary.  Continue to monitor LFTs prior to each infusion. 4.Normocytic anemia  Coombs was negative.  LDH was 178 previously.  Recheck was 3.8%.  B12 was 716 and folate 35.  Ferritin was 205 with iron saturation of 40% and TIBC 278 on 11/25/2019.   Normocytic anemia likely from chronic disease.  Asymptomatic.  Continue to monitor.  Disposition: IVIG today and over next 3 days for total of 4 days of treatment Return to clinic in 6 weeks for labs (CBC with differential, CMP, CK, aldolase), and MD evaluation and consideration of continuation of IVIG.  I discussed the assessment and treatment plan with the patient.  The patient was provided an opportunity to ask questions and all were answered.  The patient agreed with the plan and demonstrated an  understanding of the instructions.  The patient was advised to call back if the symptoms worsen or if the condition fails to improve as anticipated.   Beckey Rutter, DNP, AGNP-C [place] at [facility] [phone] (clinic)

## 2020-07-05 NOTE — Progress Notes (Signed)
Patient c/o lower back pain today ( pain level 7)

## 2020-07-06 ENCOUNTER — Ambulatory Visit: Payer: Medicare PPO

## 2020-07-06 ENCOUNTER — Inpatient Hospital Stay: Payer: Medicare PPO

## 2020-07-06 ENCOUNTER — Other Ambulatory Visit: Payer: Medicare PPO

## 2020-07-06 ENCOUNTER — Ambulatory Visit: Payer: Medicare PPO | Admitting: Nurse Practitioner

## 2020-07-06 VITALS — BP 148/71 | HR 78 | Temp 98.4°F | Resp 18

## 2020-07-06 DIAGNOSIS — M3320 Polymyositis, organ involvement unspecified: Secondary | ICD-10-CM

## 2020-07-06 LAB — ALDOLASE: Aldolase: 11.6 U/L — ABNORMAL HIGH (ref 3.3–10.3)

## 2020-07-06 MED ORDER — DEXTROSE 5 % IV SOLN
Freq: Once | INTRAVENOUS | Status: AC
  Filled 2020-07-06: qty 250

## 2020-07-06 MED ORDER — METHYLPREDNISOLONE SODIUM SUCC 125 MG IJ SOLR
40.0000 mg | INTRAMUSCULAR | Status: DC
  Administered 2020-07-06: 40 mg via INTRAVENOUS

## 2020-07-06 MED ORDER — IMMUNE GLOBULIN (HUMAN) 20 GM/200ML IV SOLN
40.0000 g | INTRAVENOUS | Status: DC
  Administered 2020-07-06: 40 g via INTRAVENOUS
  Filled 2020-07-06: qty 400

## 2020-07-06 MED ORDER — DIPHENHYDRAMINE HCL 25 MG PO TABS
25.0000 mg | ORAL_TABLET | Freq: Once | ORAL | Status: AC
  Administered 2020-07-06: 25 mg via ORAL
  Filled 2020-07-06: qty 1

## 2020-07-06 MED ORDER — ACETAMINOPHEN 325 MG PO TABS
650.0000 mg | ORAL_TABLET | Freq: Once | ORAL | Status: AC
  Administered 2020-07-06: 650 mg via ORAL

## 2020-07-06 NOTE — Patient Instructions (Signed)
Immune Globulin Injection What is this medicine? IMMUNE GLOBULIN (im MUNE GLOB yoo lin) helps to prevent or reduce the severity of certain infections in patients who are at risk. This medicine is collected from the pooled blood of many donors. It is used to treat immune system problems, thrombocytopenia, and Kawasaki syndrome. This medicine may be used for other purposes; ask your health care provider or pharmacist if you have questions. COMMON BRAND NAME(S): ASCENIV, Baygam, BIVIGAM, Carimune, Carimune NF, cutaquig, Cuvitru, Flebogamma, Flebogamma DIF, GamaSTAN, GamaSTAN S/D, Gamimune N, Gammagard, Gammagard S/D, Gammaked, Gammaplex, Gammar-P IV, Gamunex, Gamunex-C, Hizentra, Iveegam, Iveegam EN, Octagam, Panglobulin, Panglobulin NF, panzyga, Polygam S/D, Privigen, Sandoglobulin, Venoglobulin-S, Vigam, Vivaglobulin, Xembify What should I tell my health care provider before I take this medicine? They need to know if you have any of these conditions:  diabetes  extremely low or no immune antibodies in the blood  heart disease  history of blood clots  hyperprolinemia  infection in the blood, sepsis  kidney disease  recently received or scheduled to receive a vaccination  an unusual or allergic reaction to human immune globulin, albumin, maltose, sucrose, other medicines, foods, dyes, or preservatives  pregnant or trying to get pregnant  breast-feeding How should I use this medicine? This medicine is for injection into a muscle or infusion into a vein or skin. It is usually given by a health care professional in a hospital or clinic setting. In rare cases, some brands of this medicine might be given at home. You will be taught how to give this medicine. Use exactly as directed. Take your medicine at regular intervals. Do not take your medicine more often than directed. Talk to your pediatrician regarding the use of this medicine in children. While this drug may be prescribed for selected  conditions, precautions do apply. Overdosage: If you think you have taken too much of this medicine contact a poison control center or emergency room at once. NOTE: This medicine is only for you. Do not share this medicine with others. What if I miss a dose? It is important not to miss your dose. Call your doctor or health care professional if you are unable to keep an appointment. If you give yourself the medicine and you miss a dose, take it as soon as you can. If it is almost time for your next dose, take only that dose. Do not take double or extra doses. What may interact with this medicine?  aspirin and aspirin-like medicines  cisplatin  cyclosporine  medicines for infection like acyclovir, adefovir, amphotericin B, bacitracin, cidofovir, foscarnet, ganciclovir, gentamicin, pentamidine, vancomycin  NSAIDS, medicines for pain and inflammation, like ibuprofen or naproxen  pamidronate  vaccines  zoledronic acid This list may not describe all possible interactions. Give your health care provider a list of all the medicines, herbs, non-prescription drugs, or dietary supplements you use. Also tell them if you smoke, drink alcohol, or use illegal drugs. Some items may interact with your medicine. What should I watch for while using this medicine? Your condition will be monitored carefully while you are receiving this medicine. This medicine is made from pooled blood donations of many different people. It may be possible to pass an infection in this medicine. However, the donors are screened for infections and all products are tested for HIV and hepatitis. The medicine is treated to kill most or all bacteria and viruses. Talk to your doctor about the risks and benefits of this medicine. Do not have vaccinations for at least   14 days before, or until at least 3 months after receiving this medicine. What side effects may I notice from receiving this medicine? Side effects that you should report  to your doctor or health care professional as soon as possible:  allergic reactions like skin rash, itching or hives, swelling of the face, lips, or tongue  blue colored lips or skin  breathing problems  chest pain or tightness  fever  signs and symptoms of aseptic meningitis such as stiff neck; sensitivity to light; headache; drowsiness; fever; nausea; vomiting; rash  signs and symptoms of a blood clot such as chest pain; shortness of breath; pain, swelling, or warmth in the leg  signs and symptoms of hemolytic anemia such as fast heartbeat; tiredness; dark yellow or brown urine; or yellowing of the eyes or skin  signs and symptoms of kidney injury like trouble passing urine or change in the amount of urine  sudden weight gain  swelling of the ankles, feet, hands Side effects that usually do not require medical attention (report to your doctor or health care professional if they continue or are bothersome):  diarrhea  flushing  headache  increased sweating  joint pain  muscle cramps  muscle pain  nausea  pain, redness, or irritation at site where injected  tiredness This list may not describe all possible side effects. Call your doctor for medical advice about side effects. You may report side effects to FDA at 1-800-FDA-1088. Where should I keep my medicine? Keep out of the reach of children. This drug is usually given in a hospital or clinic and will not be stored at home. In rare cases, some brands of this medicine may be given at home. If you are using this medicine at home, you will be instructed on how to store this medicine. Throw away any unused medicine after the expiration date on the label. NOTE: This sheet is a summary. It may not cover all possible information. If you have questions about this medicine, talk to your doctor, pharmacist, or health care provider.  2020 Elsevier/Gold Standard (2019-07-16 12:51:14)  

## 2020-07-07 ENCOUNTER — Inpatient Hospital Stay: Payer: Medicare PPO

## 2020-07-07 ENCOUNTER — Other Ambulatory Visit: Payer: Self-pay

## 2020-07-07 VITALS — BP 134/74 | HR 84 | Temp 98.0°F | Resp 18

## 2020-07-07 DIAGNOSIS — M3320 Polymyositis, organ involvement unspecified: Secondary | ICD-10-CM | POA: Diagnosis not present

## 2020-07-07 MED ORDER — IMMUNE GLOBULIN (HUMAN) 20 GM/200ML IV SOLN
40.0000 g | INTRAVENOUS | Status: DC
  Administered 2020-07-07: 40 g via INTRAVENOUS
  Filled 2020-07-07: qty 400

## 2020-07-07 MED ORDER — ACETAMINOPHEN 325 MG PO TABS
650.0000 mg | ORAL_TABLET | Freq: Once | ORAL | Status: AC
  Administered 2020-07-07: 650 mg via ORAL
  Filled 2020-07-07: qty 2

## 2020-07-07 MED ORDER — DIPHENHYDRAMINE HCL 25 MG PO CAPS
ORAL_CAPSULE | ORAL | Status: AC
  Filled 2020-07-07: qty 1

## 2020-07-07 MED ORDER — METHYLPREDNISOLONE SODIUM SUCC 125 MG IJ SOLR
40.0000 mg | INTRAMUSCULAR | Status: DC
  Administered 2020-07-07: 40 mg via INTRAVENOUS
  Filled 2020-07-07: qty 2

## 2020-07-07 MED ORDER — DEXTROSE 5 % IV SOLN
INTRAVENOUS | Status: DC
  Filled 2020-07-07: qty 250

## 2020-07-07 MED ORDER — DIPHENHYDRAMINE HCL 25 MG PO TABS
25.0000 mg | ORAL_TABLET | Freq: Once | ORAL | Status: AC
  Administered 2020-07-07: 25 mg via ORAL
  Filled 2020-07-07: qty 1

## 2020-07-08 ENCOUNTER — Inpatient Hospital Stay: Payer: Medicare PPO

## 2020-07-08 VITALS — BP 163/78 | HR 77 | Temp 98.2°F | Resp 18

## 2020-07-08 DIAGNOSIS — M3320 Polymyositis, organ involvement unspecified: Secondary | ICD-10-CM

## 2020-07-08 MED ORDER — DEXTROSE 5 % IV SOLN
Freq: Once | INTRAVENOUS | Status: AC
  Filled 2020-07-08: qty 250

## 2020-07-08 MED ORDER — METHYLPREDNISOLONE SODIUM SUCC 125 MG IJ SOLR
40.0000 mg | INTRAMUSCULAR | Status: DC
  Administered 2020-07-08: 40 mg via INTRAVENOUS

## 2020-07-08 MED ORDER — IMMUNE GLOBULIN (HUMAN) 20 GM/200ML IV SOLN
40.0000 g | INTRAVENOUS | Status: DC
  Administered 2020-07-08: 40 g via INTRAVENOUS
  Filled 2020-07-08: qty 200

## 2020-07-08 MED ORDER — DIPHENHYDRAMINE HCL 25 MG PO TABS
25.0000 mg | ORAL_TABLET | Freq: Once | ORAL | Status: AC
  Administered 2020-07-08: 25 mg via ORAL
  Filled 2020-07-08: qty 1

## 2020-07-08 MED ORDER — ACETAMINOPHEN 325 MG PO TABS
650.0000 mg | ORAL_TABLET | Freq: Once | ORAL | Status: AC
  Administered 2020-07-08: 650 mg via ORAL

## 2020-07-08 NOTE — Progress Notes (Signed)
Patient completed her 4 days of IVIG infusions this week. No complaints of any side effects. Ambulatory and VSS at discharge from clinic.

## 2020-07-12 DIAGNOSIS — E119 Type 2 diabetes mellitus without complications: Secondary | ICD-10-CM | POA: Diagnosis not present

## 2020-07-12 LAB — HM DIABETES EYE EXAM

## 2020-07-19 DIAGNOSIS — G8929 Other chronic pain: Secondary | ICD-10-CM | POA: Diagnosis not present

## 2020-07-19 DIAGNOSIS — E119 Type 2 diabetes mellitus without complications: Secondary | ICD-10-CM | POA: Diagnosis not present

## 2020-07-19 DIAGNOSIS — M5442 Lumbago with sciatica, left side: Secondary | ICD-10-CM | POA: Diagnosis not present

## 2020-07-20 ENCOUNTER — Other Ambulatory Visit: Payer: Medicare PPO

## 2020-07-20 ENCOUNTER — Ambulatory Visit: Payer: Medicare PPO

## 2020-07-20 ENCOUNTER — Ambulatory Visit: Payer: Medicare PPO | Admitting: Hematology and Oncology

## 2020-07-21 ENCOUNTER — Inpatient Hospital Stay: Payer: Medicare PPO

## 2020-07-22 ENCOUNTER — Inpatient Hospital Stay: Payer: Medicare PPO

## 2020-07-23 ENCOUNTER — Inpatient Hospital Stay: Payer: Medicare PPO

## 2020-08-04 ENCOUNTER — Other Ambulatory Visit: Payer: Self-pay | Admitting: Physical Medicine & Rehabilitation

## 2020-08-04 DIAGNOSIS — G8929 Other chronic pain: Secondary | ICD-10-CM | POA: Diagnosis not present

## 2020-08-04 DIAGNOSIS — M5442 Lumbago with sciatica, left side: Secondary | ICD-10-CM | POA: Diagnosis not present

## 2020-08-04 DIAGNOSIS — M5116 Intervertebral disc disorders with radiculopathy, lumbar region: Secondary | ICD-10-CM | POA: Diagnosis not present

## 2020-08-04 DIAGNOSIS — M5136 Other intervertebral disc degeneration, lumbar region: Secondary | ICD-10-CM | POA: Diagnosis not present

## 2020-08-15 NOTE — Progress Notes (Addendum)
[facility]  [address] [place], [place] [address] Phone: [phone]  Fax: [phone]   Clinic Day:  08/16/2020  Referring physician: [name], MD  Chief Complaint: Bridget Romero is a 74 y.o. female with polymyositis who is seen for a 6 week assessment and continuation of IVIG.  HPI: The patient was last seen in the hematology clinic on 07/05/2020 with Beckey Rutter, NP. At that time, she had pain in her left leg that radiated down from her buttocks.  She denied any muscle weakness. Hematocrit was 31.2, hemoglobin 10.1, MCV 89.7, platelets 304,000, WBC 11200, ANC 9000.  CK was 854.  Aldolase was 11.6.  AST was 42 and ALT 49.  She received IVIG x 4 days (07/05/2020 - 07/08/2020).  She saw Dr. [name] on 07/01/2020.   She noted left lateral leg pain, left buttock and left calf pain.  There was no significant numbness or tingling.  She had had an epidural for known herniated disc with no relief.  Muscle strength was stable on IVIG every 6 weeks.  She was on prednisone 5 mg/day and methotrexate 25 mg SQ every week.  She was prescribed Zanaflex and a prednisone taper.  Updated MRI of the lumbar spine was scheduled on 08/23/2020.   During the interim, she has felt "pretty good".  She notes a little bit of low back pain leg discomfort rated a 6 out of 10.  She is on prednisone 10 mg a day.  She denies any nausea vomiting or diarrhea.  Taste sensation is "not good".  The strength in her legs is good.  She states the back of her left leg feels a little tight.   Past Medical History:  Diagnosis Date  . Allergy   . Asthma   . Diabetes mellitus   . Hyperlipidemia   . Hypertension   . Neuromuscular disorder (HCC)    polymyositis  . Osteoarthritis   . Vitamin B12 deficiency     Past Surgical History:  Procedure Laterality Date  . COLONOSCOPY WITH PROPOFOL N/A 03/07/2016   Procedure: COLONOSCOPY WITH PROPOFOL;  Surgeon: Lollie Sails, MD;   Location: [facility] ENDOSCOPY;  Service: Endoscopy;  Laterality: N/A;    Family History  Problem Relation Age of Onset  . Heart disease Mother   . Cancer Father   . Breast cancer Neg Hx     Social History:  reports that she has quit smoking. She has never used smokeless tobacco. She reports that she does not drink alcohol and does not use drugs. She is a Training and development officer. She lives in [place]. The patient is alone today.   Allergies:  Allergies  Allergen Reactions  . Atorvastatin Other (See Comments)    myalgias  . Pravastatin Other (See Comments)    Muscle pain    Current Medications: Current Outpatient Medications  Medication Sig Dispense Refill  . ACCU-CHEK FASTCLIX LANCETS MISC Use to test blood sugar twice daily dx: 250.02 100 each 3  . acetaminophen (TYLENOL) 500 MG tablet Take 500 mg by mouth every 6 (six) hours as needed.    . Alcohol Swabs (B-D SINGLE USE SWABS REGULAR) PADS Use to test blood sugar once daily dx: 250.00 100 each 3  . atorvastatin (LIPITOR) 10 MG tablet TAKE 1 TABLET BY MOUTH ONCE DAILY 90 tablet 3  . B-D TB SYRINGE 1CC/25GX5/8" 25G X 5/8" 1 ML MISC     . Blood Glucose Calibration (ACCU-CHEK AVIVA) SOLN Use as directed 1 each  3  . Cholecalciferol (VITAMIN D) 1000 UNITS capsule Take 1,000 Units by mouth daily.      . diclofenac (FLECTOR) 1.3 % PTCH Apply patch to the most painful area.    . folic acid (FOLVITE) 1 MG tablet Take 1 mg by mouth daily.    Marland Kitchen gabapentin (NEURONTIN) 300 MG capsule Take 300 mg by mouth 3 (three) times daily.     Marland Kitchen glucose blood (ACCU-CHEK SMARTVIEW) test strip Use to check blood sugar twice a day Dx Code E11.4 100 each 6  . LANTUS 100 UNIT/ML injection INJECT 10 TO 20 UNITS INTO THE SKIN DAILY AS DIRECTED 10 mL 11  . lisinopril (PRINIVIL,ZESTRIL) 10 MG tablet TAKE 1 TABLET BY MOUTH EVERY DAY 90 tablet 1  . metFORMIN (GLUCOPHAGE) 1000 MG tablet TAKE 1 TABLET BY MOUTH TWICE A DAY 180 tablet 3  . methotrexate 250 MG/10ML injection Inject into  the skin.    . methotrexate 50 MG/2ML injection Inject into the skin once a week.     . Multiple Vitamin (MULTIVITAMIN) tablet Take 1 tablet by mouth daily.      . predniSONE (DELTASONE) 5 MG tablet 6 pills x1day, 5x1,4x1,3x1,2x1,1x1    . tiZANidine (ZANAFLEX) 2 MG tablet Take by mouth.    Marland Kitchen alendronate (FOSAMAX) 70 MG tablet TAKE 1 TABLET BY MOUTH EVERY 7 DAYS WITHA FULL GLASS OF WATER. (DO NOT LIE DOWN FOR THE NEXT 30 MINUTES) (Patient not taking: Reported on 05/25/2020) 12 tablet 3  . predniSONE (DELTASONE) 10 MG tablet Take 5 mg by mouth daily.  (Patient not taking: Reported on 07/05/2020)    . traMADol (ULTRAM) 50 MG tablet Take by mouth. (Patient not taking: Reported on 07/05/2020)     No current facility-administered medications for this visit.   Facility-Administered Medications Ordered in Other Visits  Medication Dose Route Frequency Provider Last Rate Last Admin  . 0.9 %  sodium chloride infusion   Intravenous Continuous Lequita Asal, MD 10 mL/hr at 08/16/20 0944 New Bag at 08/16/20 0944  . Immune Globulin 10% (PRIVIGEN) IV infusion 40 g  40 g Intravenous Q24H Iokepa Geffre C, MD      . methylPREDNISolone sodium succinate (SOLU-MEDROL) 125 mg/2 mL injection 40 mg  40 mg Intravenous Q24H Nolon Stalls C, MD   40 mg at 08/16/20 7619    Review of Systems  Constitutional: Positive for weight loss (3 lbs). Negative for chills, diaphoresis, fever and malaise/fatigue.       Feels "pretty good".  HENT: Negative.  Negative for congestion, ear discharge, ear pain, hearing loss, nosebleeds, sinus pain, sore throat and tinnitus.   Eyes: Negative for blurred vision, double vision and photophobia.  Respiratory: Negative.  Negative for cough, hemoptysis and sputum production.   Cardiovascular: Negative.  Negative for chest pain, palpitations, orthopnea and leg swelling.  Gastrointestinal: Negative.  Negative for abdominal pain, blood in stool, constipation, diarrhea, heartburn, melena,  nausea and vomiting.       Change in taste sensation.  Genitourinary: Negative for dysuria, frequency, hematuria and urgency.  Musculoskeletal: Positive for joint pain (jaw pain). Negative for back pain, myalgias and neck pain.       Back of the left leg feels tight.  Strength good  Skin: Negative.  Negative for itching and rash.  Neurological: Negative for dizziness, tingling, sensory change, focal weakness, weakness and headaches.  Endo/Heme/Allergies: Does not bruise/bleed easily.       Diabetes on insulin and Metformin.   Psychiatric/Behavioral: Negative for depression  and memory loss. The patient is not nervous/anxious and does not have insomnia.   All other systems reviewed and are negative.  Performance status (ECOG): 1  Vitals Blood pressure (!) 149/80, pulse 91, temperature (!) 97.5 F (36.4 C), temperature source Tympanic, weight 191 lb 11 oz (87 kg), SpO2 100 %.   Physical Exam Vitals and nursing note reviewed.  Constitutional:      General: She is not in acute distress.    Appearance: Normal appearance. She is well-developed. She is not ill-appearing or diaphoretic.  HENT:     Head: Normocephalic and atraumatic.     Comments: Short dark hair.    Mouth/Throat:     Pharynx: No oropharyngeal exudate or posterior oropharyngeal erythema.  Eyes:     General: No scleral icterus.    Conjunctiva/sclera: Conjunctivae normal.     Pupils: Pupils are equal, round, and reactive to light.     Comments: Brown eyes.  Cardiovascular:     Rate and Rhythm: Normal rate and regular rhythm.     Heart sounds: Normal heart sounds. No murmur heard.   Pulmonary:     Effort: Pulmonary effort is normal. No respiratory distress.     Breath sounds: Normal breath sounds. No wheezing or rales.  Chest:     Chest wall: No tenderness.  Abdominal:     General: Bowel sounds are normal. There is no distension.     Palpations: Abdomen is soft. There is no mass.     Tenderness: There is no abdominal  tenderness. There is no guarding or rebound.  Musculoskeletal:        General: No swelling or tenderness. Normal range of motion.     Cervical back: Normal range of motion and neck supple.     Comments: Good strength in BLE.  Lymphadenopathy:     Cervical: No cervical adenopathy.  Skin:    General: Skin is warm and dry.     Coloration: Skin is not jaundiced or pale.  Neurological:     Mental Status: She is alert and oriented to person, place, and time.     Sensory: No sensory deficit.     Motor: No weakness.     Comments: Lower extremity strength 5/5 left and 5/5 right.  Gait is normal.  Able to rise from a chair without using her arms.   Psychiatric:        Behavior: Behavior normal.        Thought Content: Thought content normal.        Judgment: Judgment normal.    Appointment on 08/16/2020  Component Date Value Ref Range Status  . Total CK 08/16/2020 702* 38.0 - 234.0 U/L Final   Performed at [facility], [address], [place], [place] [address]  . Sodium 08/16/2020 141  135 - 145 mmol/L Final  . Potassium 08/16/2020 3.9  3.5 - 5.1 mmol/L Final  . Chloride 08/16/2020 104  98 - 111 mmol/L Final  . CO2 08/16/2020 29  22 - 32 mmol/L Final  . Glucose, Bld 08/16/2020 180* 70 - 99 mg/dL Final   Glucose reference range applies only to samples taken after fasting for at least 8 hours.  . BUN 08/16/2020 18  8 - 23 mg/dL Final  . Creatinine, Ser 08/16/2020 0.73  0.44 - 1.00 mg/dL Final  . Calcium 08/16/2020 9.6  8.9 - 10.3 mg/dL Final  . Total Protein 08/16/2020 6.9  6.5 - 8.1 g/dL Final  . Albumin 08/16/2020 3.7  3.5 - 5.0 g/dL Final  . AST 08/16/2020 37  15 - 41 U/L Final  . ALT 08/16/2020 48* 0 - 44 U/L Final  . Alkaline Phosphatase 08/16/2020 40  38 - 126 U/L Final  . Total Bilirubin 08/16/2020 0.8  0.3 - 1.2 mg/dL Final  . GFR calc non Af Amer 08/16/2020 >60  >60 mL/min Final  . GFR calc Af Amer 08/16/2020 >60  >60 mL/min Final  . Anion gap 08/16/2020 8  5 -  15 Final   Performed at [facility], [address] [place], [place] [address]  . WBC 08/16/2020 7.2  4.0 - 10.5 K/uL Final  . RBC 08/16/2020 3.60* 3.87 - 5.11 MIL/uL Final  . Hemoglobin 08/16/2020 10.5* 12.0 - 15.0 g/dL Final  . HCT 08/16/2020 33.0* 36 - 46 % Final  . MCV 08/16/2020 91.7  80.0 - 100.0 fL Final  . MCH 08/16/2020 29.2  26.0 - 34.0 pg Final  . MCHC 08/16/2020 31.8  30.0 - 36.0 g/dL Final  . RDW 08/16/2020 14.4  11.5 - 15.5 % Final  . Platelets 08/16/2020 313  150 - 400 K/uL Final  . nRBC 08/16/2020 0.0  0.0 - 0.2 % Final  . Neutrophils Relative % 08/16/2020 67  % Final  . Neutro Abs 08/16/2020 4.8  1.7 - 7.7 K/uL Final  . Lymphocytes Relative 08/16/2020 26  % Final  . Lymphs Abs 08/16/2020 1.9  0.7 - 4.0 K/uL Final  . Monocytes Relative 08/16/2020 6  % Final  . Monocytes Absolute 08/16/2020 0.4  0 - 1 K/uL Final  . Eosinophils Relative 08/16/2020 1  % Final  . Eosinophils Absolute 08/16/2020 0.1  0 - 0 K/uL Final  . Basophils Relative 08/16/2020 0  % Final  . Basophils Absolute 08/16/2020 0.0  0 - 0 K/uL Final  . Immature Granulocytes 08/16/2020 0  % Final  . Abs Immature Granulocytes 08/16/2020 0.03  0.00 - 0.07 K/uL Final   Performed at [facility], [address], [place], [place] [address]    Assessment:  Bridget Romero is a 74 y.o. 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 She receives IVIG monthly,methotrexate weekly, and prednisone5mg  a day.  CKhas been followed: >5555 on 05/30/2017, 4409 on 06/20/2017, 3546 on 07/05/2017, 2725 on 07/30/2017, 3675 on 08/28/2017, 2405 on 10/08/2017, 2743 on 11/08/2017, 2723 on 12/20/2017, 723 on 01/21/2018, 507 on 02/21/2018, 2192 on 04/11/2018, 2227 on 06/04/2018, 4468 on 07/22/2018, 2204 on 09/06/2018, 1360 on 12/02/2018, 1423 on 01/01/2019,912 on 03/03/2019, 3955 on 04/21/2019,4612 on 05/06/2019,4560 on 05/20/2019, 4532 on 06/16/2019,4935 on 07/10/2019,4330 on 07/28/2019, 3645 on 08/13/2019, 2514 on  08/25/2019, 807 on 09/22/2019, 251 on 10/21/2019, 67 on 11/25/2019, 66 on 12/22/2019, < 5 on 01/19/2020, 67 on 02/02/2020, 254 on 03/15/2020, 338 on 03/29/2020, 847 on 05/25/2020, 854 on 07/05/2020, and 702 on 08/16/2020.  Aldolasewas 67.2 in06/05/2017, 69.9 on06/27/2018, 64.8 on07/11/2017,57.6 on08/05/2017, 60.3 on09/03/2017, 38.7 on 12/20/2017, 9.3 on01/28/2019, 7.4 on02/28/2019, 29.1 on04/18/2019, to51.9 on07/29/2019, 23.7 on09/13/2019, 16.4 on 12/02/2018,14.1 on 01/01/2019,8.8 on 03/03/2019, 53.4 on 05/06/2019,42.9 on 05/20/2019, 39.0 in 06/16/2019, 69.3 on 07/28/2019, 37.2 on 08/25/2019, 12.5 on 09/22/2019, 7.2 on 10/21/2019, 5.2 on 11/25/2019, 4.8 on 12/22/2019, 3.9 on 01/19/2020, 4.2 on 02/02/2020, 5.5 on 03/15/2020, 5.6 on 03/29/2020, 9.1 on 05/25/2020, 11.6 on 07/05/2020, and 11.4 on 08/16/2020.  She received IVIG (Privigen) 75 gm x 2 days every 4 weeks beginning 12/07/2017.IVIG was decreased to every 8 weeks on 04/22/2019, butthen returned to monthly secondary to rise CK.IVIG wasincreased to 2 gm/kg  on08/31/2020,09/23/2019, 10/21/2019, 11/25/2019, 02/02/2020, 03/29/2020.  She was switched to every 8 weeks then every 6 weeks.  She has a normocytic anemia. Baseline hemoglobin is 10. Anemia work-upon 12/22/2019 revealed a hematocritwas32.4, hemoglobin 10.0, MCV 92.8, platelets 258,000, WBC 6200, ANC 3700. Reticwas 3.8%.[id number].Coombs was negative. Peripheralsmearrevealed anormocytic anemia. The morphologyof the RBCs, WBCs and platelets were within normal limits.  Last colonoscopy on 03/07/2016 revealed grade I hemorrhoids.She has B12 deficiencyand is on oral B12. B12 was 306 and folate >22.3 on 05/30/2017.   She has chronicmild elevation in LFTswhich have correlated with increased CPK.AST/ALThave been followed:53/54 on 02/17/2019, 107/128 on04/27/2020, 134/200 on05/26/2020,138/261 on 07/10/2019,151/304 on08/02/2019, 175/289 on  08/13/2019, 102/196 on 08/25/2019, 47/75 on 09/22/2019, 12/23 on 11/25/2019, 19/16 on 12/22/2019, 20/16 on 01/19/2020, 19/15 on 02/02/2020, 22/17 on 03/15/2020, 25/19 on 03/29/2020, 40/36 on 05/25/2020, 42/46 on 07/05/2020, and 37/48 on 08/16/2020. Hepatitis B and C testing werenegative on 06/27/2018and 07/28/2019.  Symptomatically, she is doing well.  Strength is good.  Exam is stable.  Plan: 1.   Labs today: CBC with diff, CMP, CK, aldolase. 2.Polymyositis Clinically, she continues to do well without recurrent weakness on IVIG to 2 gm/kg monthly. She is currently on prednisone 10 mg/day and MTX10mg SQweekly. CKis elevated (702), but stable. Aldolaseis elevated(11.4). Plan to continue IVIG every 6 weeks per Dr. [name].  Begin IVIG over the next 4 days. 3.Elevated LFTs, resolved AST 37and ALT 48 Increase in liver function test correlates with increased CPK. CPK reflects activity of polymyositis. Continue to monitor prior to each infusion. 4.Normocytic anemia Hematocrit37.6. Hemoglobin 11.8. MCV91.7on 01/19/2020.  Hematocrit 34.5.  Hemoglobin 10.9.  MCV 91.0 on 05/25/2020.  Hematocrit 33.0.  Hemoglobin 10.5.  MCV 91.7 on 08/16/2020. Ferritin205 with an iron sat 40% and a TIBC 278 on 11/25/2019. B12was 716 andfolate35.0. Reticwas 3.8%. Coombsnegative [id number]. Continue to monitor. 5.   RTC in 6 weeks for MD assessment, labs (CBC with diff, CMP, CK, aldolase) and IVIG over 4 days.  I discussed the assessment and treatment plan with the patient.  The patient was provided an opportunity to ask questions and all were answered.  The patient agreed with the plan and demonstrated an understanding of the instructions.  The patient was  advised to call back if the symptoms worsen or if the condition fails to improve as anticipated.    Lequita Asal, MD, PhD    08/16/2020, 10:06 AM   I, Jacqualyn Posey, am acting as a Education administrator for [employer]. Mike Gip, MD.   I, Daryn Hicks C. Mike Gip, MD, have reviewed the above documentation for accuracy and completeness, and I agree with the above.

## 2020-08-16 ENCOUNTER — Encounter: Payer: Self-pay | Admitting: Hematology and Oncology

## 2020-08-16 ENCOUNTER — Other Ambulatory Visit: Payer: Self-pay

## 2020-08-16 ENCOUNTER — Inpatient Hospital Stay: Payer: Medicare PPO

## 2020-08-16 ENCOUNTER — Inpatient Hospital Stay: Payer: Medicare PPO | Attending: Hematology and Oncology | Admitting: Hematology and Oncology

## 2020-08-16 VITALS — BP 149/80 | HR 91 | Temp 97.5°F | Wt 191.7 lb

## 2020-08-16 VITALS — BP 155/86 | HR 82 | Temp 97.8°F | Resp 18

## 2020-08-16 DIAGNOSIS — R7989 Other specified abnormal findings of blood chemistry: Secondary | ICD-10-CM | POA: Diagnosis not present

## 2020-08-16 DIAGNOSIS — M3320 Polymyositis, organ involvement unspecified: Secondary | ICD-10-CM | POA: Diagnosis not present

## 2020-08-16 DIAGNOSIS — D649 Anemia, unspecified: Secondary | ICD-10-CM

## 2020-08-16 LAB — COMPREHENSIVE METABOLIC PANEL
ALT: 48 U/L — ABNORMAL HIGH (ref 0–44)
AST: 37 U/L (ref 15–41)
Albumin: 3.7 g/dL (ref 3.5–5.0)
Alkaline Phosphatase: 40 U/L (ref 38–126)
Anion gap: 8 (ref 5–15)
BUN: 18 mg/dL (ref 8–23)
CO2: 29 mmol/L (ref 22–32)
Calcium: 9.6 mg/dL (ref 8.9–10.3)
Chloride: 104 mmol/L (ref 98–111)
Creatinine, Ser: 0.73 mg/dL (ref 0.44–1.00)
GFR calc Af Amer: >60 mL/min (ref >60)
GFR calc non Af Amer: >60 mL/min (ref >60)
Glucose, Bld: 180 mg/dL — ABNORMAL HIGH (ref 70–99)
Potassium: 3.9 mmol/L (ref 3.5–5.1)
Sodium: 141 mmol/L (ref 135–145)
Total Bilirubin: 0.8 mg/dL (ref 0.3–1.2)
Total Protein: 6.9 g/dL (ref 6.5–8.1)

## 2020-08-16 LAB — CBC WITH DIFFERENTIAL/PLATELET
Abs Immature Granulocytes: 0.03 10*3/uL (ref 0.00–0.07)
Basophils Absolute: 0 10*3/uL (ref 0.0–0.1)
Basophils Relative: 0 %
Eosinophils Absolute: 0.1 10*3/uL (ref 0.0–0.5)
Eosinophils Relative: 1 %
HCT: 33 % — ABNORMAL LOW (ref 36.0–46.0)
Hemoglobin: 10.5 g/dL — ABNORMAL LOW (ref 12.0–15.0)
Immature Granulocytes: 0 %
Lymphocytes Relative: 26 %
Lymphs Abs: 1.9 10*3/uL (ref 0.7–4.0)
MCH: 29.2 pg (ref 26.0–34.0)
MCHC: 31.8 g/dL (ref 30.0–36.0)
MCV: 91.7 fL (ref 80.0–100.0)
Monocytes Absolute: 0.4 10*3/uL (ref 0.1–1.0)
Monocytes Relative: 6 %
Neutro Abs: 4.8 10*3/uL (ref 1.7–7.7)
Neutrophils Relative %: 67 %
Platelets: 313 10*3/uL (ref 150–400)
RBC: 3.6 MIL/uL — ABNORMAL LOW (ref 3.87–5.11)
RDW: 14.4 % (ref 11.5–15.5)
WBC: 7.2 10*3/uL (ref 4.0–10.5)
nRBC: 0 % (ref 0.0–0.2)

## 2020-08-16 LAB — CK: Total CK: 702 U/L — ABNORMAL HIGH (ref 38–234)

## 2020-08-16 MED ORDER — DEXTROSE 5 % IV SOLN
INTRAVENOUS | Status: DC
  Filled 2020-08-16: qty 250

## 2020-08-16 MED ORDER — METHYLPREDNISOLONE SODIUM SUCC 125 MG IJ SOLR
40.0000 mg | INTRAMUSCULAR | Status: DC
  Administered 2020-08-16: 40 mg via INTRAVENOUS
  Filled 2020-08-16: qty 2

## 2020-08-16 MED ORDER — DIPHENHYDRAMINE HCL 25 MG PO CAPS
ORAL_CAPSULE | ORAL | Status: AC
  Filled 2020-08-16: qty 1

## 2020-08-16 MED ORDER — IMMUNE GLOBULIN (HUMAN) 20 GM/200ML IV SOLN
40.0000 g | INTRAVENOUS | Status: DC
  Administered 2020-08-16: 40 g via INTRAVENOUS
  Filled 2020-08-16: qty 400

## 2020-08-16 MED ORDER — ACETAMINOPHEN 325 MG PO TABS
650.0000 mg | ORAL_TABLET | Freq: Once | ORAL | Status: AC
  Administered 2020-08-16: 650 mg via ORAL
  Filled 2020-08-16: qty 2

## 2020-08-16 MED ORDER — DIPHENHYDRAMINE HCL 25 MG PO TABS
25.0000 mg | ORAL_TABLET | Freq: Once | ORAL | Status: AC
  Administered 2020-08-16: 25 mg via ORAL
  Filled 2020-08-16: qty 1

## 2020-08-16 MED ORDER — SODIUM CHLORIDE 0.9 % IV SOLN
INTRAVENOUS | Status: DC
  Filled 2020-08-16: qty 250

## 2020-08-16 NOTE — Progress Notes (Signed)
The patient c/o lower back pain and bilateral legs Pain level 6

## 2020-08-17 ENCOUNTER — Inpatient Hospital Stay: Payer: Medicare PPO

## 2020-08-17 VITALS — BP 139/84 | HR 78 | Temp 97.9°F | Resp 18

## 2020-08-17 DIAGNOSIS — M3320 Polymyositis, organ involvement unspecified: Secondary | ICD-10-CM

## 2020-08-17 LAB — ALDOLASE: Aldolase: 11.4 U/L — ABNORMAL HIGH (ref 3.3–10.3)

## 2020-08-17 MED ORDER — METHYLPREDNISOLONE SODIUM SUCC 125 MG IJ SOLR
40.0000 mg | INTRAMUSCULAR | Status: DC
  Administered 2020-08-17: 40 mg via INTRAVENOUS
  Filled 2020-08-17: qty 2

## 2020-08-17 MED ORDER — IMMUNE GLOBULIN (HUMAN) 20 GM/200ML IV SOLN
40.0000 g | INTRAVENOUS | Status: DC
  Administered 2020-08-17: 40 g via INTRAVENOUS
  Filled 2020-08-17: qty 400

## 2020-08-17 MED ORDER — DIPHENHYDRAMINE HCL 25 MG PO CAPS
ORAL_CAPSULE | ORAL | Status: AC
  Filled 2020-08-17: qty 1

## 2020-08-17 MED ORDER — DEXTROSE 5 % IV SOLN
INTRAVENOUS | Status: DC
  Filled 2020-08-17: qty 250

## 2020-08-17 MED ORDER — DIPHENHYDRAMINE HCL 25 MG PO TABS
25.0000 mg | ORAL_TABLET | Freq: Once | ORAL | Status: AC
  Administered 2020-08-17: 25 mg via ORAL
  Filled 2020-08-17: qty 1

## 2020-08-17 MED ORDER — ACETAMINOPHEN 325 MG PO TABS
650.0000 mg | ORAL_TABLET | Freq: Once | ORAL | Status: AC
  Administered 2020-08-17: 650 mg via ORAL
  Filled 2020-08-17: qty 2

## 2020-08-18 ENCOUNTER — Inpatient Hospital Stay: Payer: Medicare PPO

## 2020-08-18 ENCOUNTER — Ambulatory Visit: Payer: Medicare PPO | Admitting: Hematology and Oncology

## 2020-08-18 ENCOUNTER — Ambulatory Visit: Payer: Medicare PPO

## 2020-08-18 ENCOUNTER — Other Ambulatory Visit: Payer: Medicare PPO

## 2020-08-18 ENCOUNTER — Other Ambulatory Visit: Payer: Self-pay

## 2020-08-18 VITALS — BP 145/69 | HR 78 | Temp 97.8°F | Resp 18

## 2020-08-18 DIAGNOSIS — M3320 Polymyositis, organ involvement unspecified: Secondary | ICD-10-CM | POA: Diagnosis not present

## 2020-08-18 MED ORDER — METHYLPREDNISOLONE SODIUM SUCC 125 MG IJ SOLR
40.0000 mg | INTRAMUSCULAR | Status: DC
  Administered 2020-08-18: 40 mg via INTRAVENOUS
  Filled 2020-08-18: qty 2

## 2020-08-18 MED ORDER — DEXTROSE 5 % IV SOLN
INTRAVENOUS | Status: DC
  Filled 2020-08-18: qty 250

## 2020-08-18 MED ORDER — DIPHENHYDRAMINE HCL 25 MG PO TABS
25.0000 mg | ORAL_TABLET | Freq: Once | ORAL | Status: AC
  Administered 2020-08-18: 25 mg via ORAL
  Filled 2020-08-18: qty 1

## 2020-08-18 MED ORDER — IMMUNE GLOBULIN (HUMAN) 20 GM/200ML IV SOLN
40.0000 g | INTRAVENOUS | Status: DC
  Administered 2020-08-18: 40 g via INTRAVENOUS
  Filled 2020-08-18: qty 400

## 2020-08-18 MED ORDER — DIPHENHYDRAMINE HCL 25 MG PO CAPS
ORAL_CAPSULE | ORAL | Status: AC
  Filled 2020-08-18: qty 1

## 2020-08-18 MED ORDER — ACETAMINOPHEN 325 MG PO TABS
650.0000 mg | ORAL_TABLET | Freq: Once | ORAL | Status: AC
  Administered 2020-08-18: 650 mg via ORAL
  Filled 2020-08-18: qty 2

## 2020-08-19 ENCOUNTER — Telehealth: Payer: Self-pay

## 2020-08-19 ENCOUNTER — Inpatient Hospital Stay: Payer: Medicare PPO

## 2020-08-19 VITALS — BP 147/81 | HR 81 | Temp 96.2°F | Resp 18

## 2020-08-19 DIAGNOSIS — M3320 Polymyositis, organ involvement unspecified: Secondary | ICD-10-CM

## 2020-08-19 MED ORDER — IMMUNE GLOBULIN (HUMAN) 20 GM/200ML IV SOLN
40.0000 g | INTRAVENOUS | Status: DC
  Administered 2020-08-19: 40 g via INTRAVENOUS
  Filled 2020-08-19: qty 400

## 2020-08-19 MED ORDER — DIPHENHYDRAMINE HCL 25 MG PO CAPS
ORAL_CAPSULE | ORAL | Status: AC
  Filled 2020-08-19: qty 1

## 2020-08-19 MED ORDER — DEXTROSE 5 % IV SOLN
INTRAVENOUS | Status: DC
  Filled 2020-08-19: qty 250

## 2020-08-19 MED ORDER — METHYLPREDNISOLONE SODIUM SUCC 125 MG IJ SOLR
40.0000 mg | INTRAMUSCULAR | Status: DC
  Administered 2020-08-19: 40 mg via INTRAVENOUS
  Filled 2020-08-19: qty 2

## 2020-08-19 MED ORDER — ACETAMINOPHEN 325 MG PO TABS
650.0000 mg | ORAL_TABLET | Freq: Once | ORAL | Status: AC
  Administered 2020-08-19: 650 mg via ORAL
  Filled 2020-08-19: qty 2

## 2020-08-19 MED ORDER — DIPHENHYDRAMINE HCL 25 MG PO CAPS
25.0000 mg | ORAL_CAPSULE | Freq: Once | ORAL | Status: AC
  Administered 2020-08-19: 25 mg via ORAL
  Filled 2020-08-19: qty 1

## 2020-08-19 NOTE — Telephone Encounter (Signed)
Labs routed to Dr [name] office.

## 2020-08-23 ENCOUNTER — Ambulatory Visit
Admission: RE | Admit: 2020-08-23 | Discharge: 2020-08-23 | Disposition: A | Discharge status: Home / Self Care | Payer: Medicare PPO | Source: Ambulatory Visit | Attending: Physical Medicine & Rehabilitation | Admitting: Physical Medicine & Rehabilitation

## 2020-08-23 ENCOUNTER — Other Ambulatory Visit: Payer: Self-pay

## 2020-08-23 DIAGNOSIS — M5442 Lumbago with sciatica, left side: Secondary | ICD-10-CM | POA: Diagnosis not present

## 2020-08-23 DIAGNOSIS — G8929 Other chronic pain: Secondary | ICD-10-CM | POA: Insufficient documentation

## 2020-08-23 DIAGNOSIS — M545 Low back pain: Secondary | ICD-10-CM | POA: Diagnosis not present

## 2020-08-24 DIAGNOSIS — M5416 Radiculopathy, lumbar region: Secondary | ICD-10-CM | POA: Diagnosis not present

## 2020-08-31 ENCOUNTER — Other Ambulatory Visit: Payer: Medicare PPO

## 2020-08-31 ENCOUNTER — Ambulatory Visit: Payer: Medicare PPO | Admitting: Hematology and Oncology

## 2020-09-01 ENCOUNTER — Ambulatory Visit: Payer: Medicare PPO | Admitting: Family Medicine

## 2020-09-01 ENCOUNTER — Encounter: Payer: Self-pay | Admitting: Family Medicine

## 2020-09-01 ENCOUNTER — Other Ambulatory Visit: Payer: Self-pay

## 2020-09-01 VITALS — BP 136/78 | HR 84 | Temp 97.6°F | Ht 66.0 in | Wt 194.2 lb

## 2020-09-01 DIAGNOSIS — Z23 Encounter for immunization: Secondary | ICD-10-CM | POA: Diagnosis not present

## 2020-09-01 DIAGNOSIS — R351 Nocturia: Secondary | ICD-10-CM

## 2020-09-01 DIAGNOSIS — R1030 Lower abdominal pain, unspecified: Secondary | ICD-10-CM | POA: Diagnosis not present

## 2020-09-01 MED ORDER — TOLTERODINE TARTRATE ER 4 MG PO CP24: 4.0000 mg | ORAL_CAPSULE | Freq: Every day | ORAL | 2 refills | Status: DC

## 2020-09-01 NOTE — Progress Notes (Signed)
Takuya Lariccia T. Jovana Rembold, MD, [place] at [facility] [place], [address]  Phone: [phone]  FAX: [phone]  Bridget Romero - 74 y.o. female  MRN [id number]  Date of Birth: 1946-02-13  Date: 09/01/2020  PCP: [name], MD  Referral: [name], MD  Chief Complaint  Patient presents with  . Urinary Frequency    x3 months.pt states happens only at night (noturia?)  . Abdominal Pain    occurs at night only    This visit occurred during the SARS-CoV-2 public health emergency.  Safety protocols were in place, including screening questions prior to the visit, additional usage of staff PPE, and extensive cleaning of exam room while observing appropriate contact time as indicated for disinfecting solutions.   Subjective:   Bridget Romero is a 74 y.o. very pleasant female patient with Body mass index is 31.35 kg/m. who presents with the following:  Abdominal pain at night and nocturia for 3 months.   Thinks she has a bladder problem.  On and off and does not have pain.  This is been ongoing for 3 months, and it predominantly happens only at nighttime.  Lab Results  Component Value Date   HGBA1C 7.5 (A) 04/23/2020    AM was 128, and generally her blood sugars fairly well controlled.  No pain - and has some pain in the lower part of her stomach.   No blood in urine.  Increased frequency No accidents. Only after the last few months.   Stops drinking around seven. No ETOH. Urinating at night only, approximately 3-4 times nightly.  Review of Systems is noted in the HPI, as appropriate  Objective:   BP 136/78   Pulse 84   Temp 97.6 F (36.4 C) (Temporal)   Ht 5\' 6"  (1.676 m)   Wt 194 lb 4 oz (88.1 kg)   SpO2 98%   BMI 31.35 kg/m   GEN: No acute distress; alert,appropriate. PULM: Breathing comfortably in no respiratory distress PSYCH: Normally  interactive.  ABD: S, NT, ND, + BS, No rebound, No HSM  CV: RRR, no m/g/r   Laboratory and Imaging Data:  Assessment and Plan:     ICD-10-CM   1. Nocturia  R35.1   2. Need for influenza vaccination  Z23 Flu Vaccine QUAD High Dose(Fluad)  3. Lower abdominal pain  R10.30    I have not 100% clear on the exact etiology.  Blood sugar is normal.  Abdomen is benign today.  With nocturia in a setting where she is not drinking too much, certainly overactive bladder is a possibility.  Initially, I am going to start the patient on some Detrol LA, and hopefully this will help her symptoms.  If this does not help, then further discussion with Dr. Silvio Pate will possibly urology would be reasonable next step.  Follow-up: No follow-ups on file.  Meds ordered this encounter  Medications  . tolterodine (DETROL LA) 4 MG 24 hr capsule    Sig: Take 1 capsule (4 mg total) by mouth daily.    Dispense:  30 capsule    Refill:  2   Medications Discontinued During This Encounter  Medication Reason  . diclofenac (FLECTOR) 1.3 % PTCH Prescription never filled  . predniSONE (DELTASONE) 5 MG tablet Change in therapy   Orders Placed This Encounter  Procedures  . Flu Vaccine QUAD High Dose(Fluad)    Signed,  Frederico Hamman  Celedonio Savage, MD   Outpatient Encounter Medications as of 09/01/2020  Medication Sig  . ACCU-CHEK FASTCLIX LANCETS MISC Use to test blood sugar twice daily dx: 250.02  . acetaminophen (TYLENOL) 500 MG tablet Take 500 mg by mouth every 6 (six) hours as needed.  . Alcohol Swabs (B-D SINGLE USE SWABS REGULAR) PADS Use to test blood sugar once daily dx: 250.00  . atorvastatin (LIPITOR) 10 MG tablet TAKE 1 TABLET BY MOUTH ONCE DAILY  . B-D TB SYRINGE 1CC/25GX5/8" 25G X 5/8" 1 ML MISC   . Blood Glucose Calibration (ACCU-CHEK AVIVA) SOLN Use as directed  . Cholecalciferol (VITAMIN D) 1000 UNITS capsule Take 1,000 Units by mouth daily.    . folic acid (FOLVITE) 1 MG tablet Take 1 mg by mouth daily.   Marland Kitchen gabapentin (NEURONTIN) 300 MG capsule Take 300 mg by mouth 3 (three) times daily.   Marland Kitchen glucose blood (ACCU-CHEK SMARTVIEW) test strip Use to check blood sugar twice a day Dx Code E11.4  . LANTUS 100 UNIT/ML injection INJECT 10 TO 20 UNITS INTO THE SKIN DAILY AS DIRECTED  . lisinopril (PRINIVIL,ZESTRIL) 10 MG tablet TAKE 1 TABLET BY MOUTH EVERY DAY  . metFORMIN (GLUCOPHAGE) 1000 MG tablet TAKE 1 TABLET BY MOUTH TWICE A DAY  . methotrexate 50 MG/2ML injection Inject into the skin once a week.   . Multiple Vitamin (MULTIVITAMIN) tablet Take 1 tablet by mouth daily.    . predniSONE (DELTASONE) 10 MG tablet Take 5 mg by mouth daily.   . [DISCONTINUED] diclofenac (FLECTOR) 1.3 % PTCH Apply patch to the most painful area.  . [DISCONTINUED] predniSONE (DELTASONE) 5 MG tablet 6 pills x1day, 5x1,4x1,3x1,2x1,1x1  . alendronate (FOSAMAX) 70 MG tablet TAKE 1 TABLET BY MOUTH EVERY 7 DAYS WITHA FULL GLASS OF WATER. (DO NOT LIE DOWN FOR THE NEXT 30 MINUTES) (Patient not taking: Reported on 09/01/2020)  . methotrexate 250 MG/10ML injection Inject into the skin. (Patient not taking: Reported on 09/01/2020)  . tiZANidine (ZANAFLEX) 2 MG tablet Take by mouth. (Patient not taking: Reported on 09/01/2020)  . tolterodine (DETROL LA) 4 MG 24 hr capsule Take 1 capsule (4 mg total) by mouth daily.  . traMADol (ULTRAM) 50 MG tablet Take by mouth.  (Patient not taking: Reported on 09/01/2020)   No facility-administered encounter medications on file as of 09/01/2020.

## 2020-09-06 ENCOUNTER — Other Ambulatory Visit: Payer: Medicare PPO

## 2020-09-06 ENCOUNTER — Ambulatory Visit: Payer: Medicare PPO | Admitting: Hematology and Oncology

## 2020-09-07 ENCOUNTER — Inpatient Hospital Stay: Payer: Medicare PPO

## 2020-09-08 ENCOUNTER — Inpatient Hospital Stay: Payer: Medicare PPO

## 2020-09-09 ENCOUNTER — Inpatient Hospital Stay: Payer: Medicare PPO

## 2020-09-10 ENCOUNTER — Ambulatory Visit: Payer: Medicare PPO

## 2020-09-22 ENCOUNTER — Other Ambulatory Visit: Payer: Self-pay

## 2020-09-22 DIAGNOSIS — M3320 Polymyositis, organ involvement unspecified: Secondary | ICD-10-CM

## 2020-09-23 NOTE — Progress Notes (Signed)
[facility]  [address], [address] [place], [place] [address] Phone: [phone]  Fax: [phone]   Clinic Day:  09/27/2020  Referring physician: [name], MD  Chief Complaint: Bridget Romero is a 74 y.o. 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  HPI: The patient was last seen in the hematology clinic on 08/16/2020. At that time, she was doing well.  Strength was good.  Exam was stable. Hematocrit was 33.0, hemoglobin 10.5, MCV 91.7, platelets 313,000, WBC 7,200.  AST was 37 and ALT was 48. Aldolase was 11.4.  CK was 702.   She received IVIG x 4 days (08/16/2020 - 08/19/2020).  She saw Dr Deetta Perla in neurosurgery on 08/24/2020 for back and left leg pain.  She appeared to have a combination of symptoms from her myositis and S1 radiculopathy.  New disc herniation appeared to contribute to her symptoms in her leg.  He discussed potential left L5-L1 hemilaminectomy discectomy.  During the interim, she has been "pretty good." She is on a medication for urinary frequency. Her sense of taste is improving. The back of her left leg is still tight. Her diabetes is "doing good."  She states that she is not planning to have surgery on her back. She is going to try gabapentin. She denies any trouble with walking or getting up from a chair. Strength is stable.   Past Medical History:  Diagnosis Date  . Allergy   . Asthma   . Diabetes mellitus   . Hyperlipidemia   . Hypertension   . Neuromuscular disorder (HCC)    polymyositis  . Osteoarthritis   . Vitamin B12 deficiency     Past Surgical History:  Procedure Laterality Date  . COLONOSCOPY WITH PROPOFOL N/A 03/07/2016   Procedure: COLONOSCOPY WITH PROPOFOL;  Surgeon: Lollie Sails, MD;  [facility];  Service: Endoscopy;  Laterality: N/A;    Family History  Problem Relation Age of Onset  . Heart disease Mother   . Cancer Father   .  Breast cancer Neg Hx     Social History:  reports that she has quit smoking. She has never used smokeless tobacco. She reports that she does not drink alcohol and does not use drugs. She is a Training and development officer. She lives in [place]. The patient is alone today.   Allergies:  Allergies  Allergen Reactions  . Atorvastatin Other (See Comments)    myalgias  . Pravastatin Other (See Comments)    Muscle pain    Current Medications: Current Outpatient Medications  Medication Sig Dispense Refill  . ACCU-CHEK FASTCLIX LANCETS MISC Use to test blood sugar twice daily dx: 250.02 100 each 3  . acetaminophen (TYLENOL) 500 MG tablet Take 500 mg by mouth every 6 (six) hours as needed.    . Alcohol Swabs (B-D SINGLE USE SWABS REGULAR) PADS Use to test blood sugar once daily dx: 250.00 100 each 3  . atorvastatin (LIPITOR) 10 MG tablet TAKE 1 TABLET BY MOUTH ONCE DAILY 90 tablet 3  . B-D TB SYRINGE 1CC/25GX5/8" 25G X 5/8" 1 ML MISC     . Blood Glucose Calibration (ACCU-CHEK AVIVA) SOLN Use as directed 1 each 3  . Cholecalciferol (VITAMIN D) 1000 UNITS capsule Take 1,000 Units by mouth daily.      . folic acid (FOLVITE) 1 MG tablet Take 1 mg by mouth daily.    Marland Kitchen gabapentin (NEURONTIN) 300 MG capsule Take 300 mg by mouth  3 (three) times daily.     Marland Kitchen glucose blood (ACCU-CHEK SMARTVIEW) test strip Use to check blood sugar twice a day Dx Code E11.4 100 each 6  . LANTUS 100 UNIT/ML injection INJECT 10 TO 20 UNITS INTO THE SKIN DAILY AS DIRECTED 10 mL 11  . lisinopril (PRINIVIL,ZESTRIL) 10 MG tablet TAKE 1 TABLET BY MOUTH EVERY DAY 90 tablet 1  . metFORMIN (GLUCOPHAGE) 1000 MG tablet TAKE 1 TABLET BY MOUTH TWICE A DAY 180 tablet 3  . methotrexate 50 MG/2ML injection Inject into the skin once a week.     . Multiple Vitamin (MULTIVITAMIN) tablet Take 1 tablet by mouth daily.      . predniSONE (DELTASONE) 10 MG tablet Take 5 mg by mouth daily.     Marland Kitchen alendronate (FOSAMAX) 70 MG tablet TAKE 1 TABLET BY MOUTH EVERY 7 DAYS  WITHA FULL GLASS OF WATER. (DO NOT LIE DOWN FOR THE NEXT 30 MINUTES) (Patient not taking: Reported on 09/01/2020) 12 tablet 3  . methotrexate 250 MG/10ML injection Inject into the skin. (Patient not taking: Reported on 09/01/2020)    . tiZANidine (ZANAFLEX) 2 MG tablet Take by mouth. (Patient not taking: Reported on 09/01/2020)    . tolterodine (DETROL LA) 4 MG 24 hr capsule Take 1 capsule (4 mg total) by mouth daily. (Patient not taking: Reported on 09/27/2020) 30 capsule 2  . traMADol (ULTRAM) 50 MG tablet Take by mouth.  (Patient not taking: Reported on 09/01/2020)     No current facility-administered medications for this visit.    Review of Systems  Constitutional: Negative.  Negative for chills, diaphoresis, fever, malaise/fatigue and weight loss (up 4 lbs).       Feels "pretty good".  HENT: Negative.  Negative for congestion, ear discharge, ear pain, hearing loss, nosebleeds, sinus pain, sore throat and tinnitus.   Eyes: Negative.  Negative for blurred vision, double vision and photophobia.  Respiratory: Negative.  Negative for cough, hemoptysis, sputum production and shortness of breath.   Cardiovascular: Negative.  Negative for chest pain, palpitations, orthopnea and leg swelling.  Gastrointestinal: Negative.  Negative for abdominal pain, blood in stool, constipation, diarrhea, heartburn, melena, nausea and vomiting.       Taste is improving.  Genitourinary: Negative.  Negative for dysuria, frequency, hematuria and urgency.  Musculoskeletal: Negative for back pain, joint pain, myalgias and neck pain.       Back of the left leg feels tight.  Skin: Negative.  Negative for itching and rash.  Neurological: Negative.  Negative for dizziness, tingling, sensory change, focal weakness, weakness and headaches.  Endo/Heme/Allergies: Does not bruise/bleed easily.       Diabetes on insulin and Metformin.   Psychiatric/Behavioral: Negative.  Negative for depression and memory loss. The patient is not  nervous/anxious and does not have insomnia.   All other systems reviewed and are negative.  Performance status (ECOG): 1  Vitals Blood pressure (!) 158/80, pulse 77, temperature 97.6 F (36.4 C), temperature source Tympanic, weight 195 lb 12.3 oz (88.8 kg), SpO2 100 %.   Physical Exam Vitals and nursing note reviewed.  Constitutional:      General: She is not in acute distress.    Appearance: Normal appearance. She is well-developed. She is not ill-appearing or diaphoretic.  HENT:     Head: Normocephalic and atraumatic.     Comments: Short dark hair.    Mouth/Throat:     Pharynx: No oropharyngeal exudate or posterior oropharyngeal erythema.  Eyes:     General:  No scleral icterus.    Conjunctiva/sclera: Conjunctivae normal.     Pupils: Pupils are equal, round, and reactive to light.     Comments: Brown eyes.  Cardiovascular:     Rate and Rhythm: Normal rate and regular rhythm.     Heart sounds: Normal heart sounds. No murmur heard.   Pulmonary:     Effort: Pulmonary effort is normal. No respiratory distress.     Breath sounds: Normal breath sounds. No wheezing or rales.  Chest:     Chest wall: No tenderness.  Abdominal:     General: Bowel sounds are normal. There is no distension.     Palpations: Abdomen is soft. There is no mass.     Tenderness: There is no abdominal tenderness. There is no guarding or rebound.  Musculoskeletal:        General: No swelling or tenderness. Normal range of motion.     Cervical back: Normal range of motion and neck supple.     Comments: Good strength in BLE.  Lymphadenopathy:     Cervical: No cervical adenopathy.  Skin:    General: Skin is warm and dry.     Coloration: Skin is not jaundiced or pale.  Neurological:     Mental Status: She is alert and oriented to person, place, and time.     Sensory: No sensory deficit.     Motor: No weakness.     Comments: Lower extremity strength is symmetric and normal. No patellar reflexes.    Psychiatric:        Behavior: Behavior normal.        Thought Content: Thought content normal.        Judgment: Judgment normal.    Appointment on 09/27/2020  Component Date Value Ref Range Status  . WBC 09/27/2020 7.7  4.0 - 10.5 K/uL Final  . RBC 09/27/2020 3.64* 3.87 - 5.11 MIL/uL Final  . Hemoglobin 09/27/2020 10.6* 12.0 - 15.0 g/dL Final  . HCT 09/27/2020 33.5* 36 - 46 % Final  . MCV 09/27/2020 92.0  80.0 - 100.0 fL Final  . MCH 09/27/2020 29.1  26.0 - 34.0 pg Final  . MCHC 09/27/2020 31.6  30.0 - 36.0 g/dL Final  . RDW 09/27/2020 14.4  11.5 - 15.5 % Final  . Platelets 09/27/2020 291  150 - 400 K/uL Final  . nRBC 09/27/2020 0.0  0.0 - 0.2 % Final  . Neutrophils Relative % 09/27/2020 69  % Final  . Neutro Abs 09/27/2020 5.3  1.7 - 7.7 K/uL Final  . Lymphocytes Relative 09/27/2020 24  % Final  . Lymphs Abs 09/27/2020 1.9  0.7 - 4.0 K/uL Final  . Monocytes Relative 09/27/2020 5  % Final  . Monocytes Absolute 09/27/2020 0.4  0 - 1 K/uL Final  . Eosinophils Relative 09/27/2020 1  % Final  . Eosinophils Absolute 09/27/2020 0.1  0 - 0 K/uL Final  . Basophils Relative 09/27/2020 1  % Final  . Basophils Absolute 09/27/2020 0.0  0 - 0 K/uL Final  . Immature Granulocytes 09/27/2020 0  % Final  . Abs Immature Granulocytes 09/27/2020 0.03  0.00 - 0.07 K/uL Final   Performed at [facility], [address], [place],  [address]    Assessment:  Bridget Romero is a 74 y.o. 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 She receives IVIG monthly,methotrexate weekly, and prednisone5mg  a day.  CKhas been followed: >5555 on 05/30/2017, 4409 on 06/20/2017, 3546 on 07/05/2017, 2725 on 07/30/2017, 3675 on 08/28/2017, 2405 on  10/08/2017, 2743 on 11/08/2017, 2723 on 12/20/2017, 723 on 01/21/2018, 507 on 02/21/2018, 2192 on 04/11/2018, 2227 on 06/04/2018, 4468 on 07/22/2018, 2204 on 09/06/2018, 1360 on 12/02/2018, 1423 on 01/01/2019,912 on 03/03/2019, 3955 on 04/21/2019,4612 on  05/06/2019,4560 on 05/20/2019, 4532 on 06/16/2019,4935 on 07/10/2019,4330 on 07/28/2019, 3645 on 08/13/2019, 2514 on 08/25/2019, 807 on 09/22/2019, 251 on 10/21/2019, 67 on 11/25/2019, 66 on 12/22/2019, < 5 on 01/19/2020, 67 on 02/02/2020, 254 on 03/15/2020, 338 on 03/29/2020, 847 on 05/25/2020, 854 on 07/05/2020, 702 on 08/16/2020, and 811 on 09/27/2020.  Aldolasewas 67.2 in06/05/2017, 69.9 on06/27/2018, 64.8 on07/11/2017,57.6 on08/05/2017, 60.3 on09/03/2017, 38.7 on 12/20/2017, 9.3 on01/28/2019, 7.4 on02/28/2019, 29.1 on04/18/2019, to51.9 on07/29/2019, 23.7 on09/13/2019, 16.4 on 12/02/2018,14.1 on 01/01/2019,8.8 on 03/03/2019, 53.4 on 05/06/2019,42.9 on 05/20/2019, 39.0 in 06/16/2019, 69.3 on 07/28/2019, 37.2 on 08/25/2019, 12.5 on 09/22/2019, 7.2 on 10/21/2019, 5.2 on 11/25/2019, 4.8 on 12/22/2019, 3.9 on 01/19/2020, 4.2 on 02/02/2020, 5.5 on 03/15/2020, 5.6 on 03/29/2020, 9.1 on 05/25/2020, 11.6 on 07/05/2020, and 11.4 on 08/16/2020.  She received IVIG (Privigen) 75 gm x 2 days every 4 weeks beginning 12/07/2017.IVIG was decreased to every 8 weeks on 04/22/2019, butthen returned to monthly secondary to rise CK.IVIG wasincreased to 2 gm/kg on08/31/2020,09/23/2019, 10/21/2019, 11/25/2019, 02/02/2020, 03/29/2020, 05/25/2020, 07/05/2020, 08/16/2020.  She was switched to every 8 weeks then every 6 weeks.  She has a normocytic anemia. Baseline hemoglobin is 10. Anemia work-upon 12/22/2019 revealed a hematocritwas32.4, hemoglobin 10.0, MCV 92.8, platelets 258,000, WBC 6200, ANC 3700. Reticwas 3.8%.[id number].Coombs was negative. Peripheralsmearrevealed anormocytic anemia. The morphologyof the RBCs, WBCs and platelets were within normal limits.  Last colonoscopy on 03/07/2016 revealed grade I hemorrhoids.She has B12 deficiencyand is on oral B12. B12 was 306 and folate >22.3 on 05/30/2017.   She has chronicmild elevation in LFTswhich have correlated with  increased CPK.AST/ALThave been followed:53/54 on 02/17/2019, 107/128 on04/27/2020, 134/200 on05/26/2020,138/261 on 07/10/2019,151/304 on08/02/2019, 175/289 on 08/13/2019, 102/196 on 08/25/2019, 47/75 on 09/22/2019, 12/23 on 11/25/2019, 19/16 on 12/22/2019, 20/16 on 01/19/2020, 19/15 on 02/02/2020, 22/17 on 03/15/2020, 25/19 on 03/29/2020, 40/36 on 05/25/2020, 42/46 on 07/05/2020, 37/48 on 08/16/2020, and 36/44 on 09/27/2020. Hepatitis B and C testing werenegative on 06/27/2018and 07/28/2019.  Symptomatically, she is doing well.  She is taking gabapentin for her back.  She denies any lower extremity weakness.  Exam is stable.  Plan: 1.   Labs today: CBC with diff, CMP, CK, aldolase. 2.Polymyositis Clinically, she remains stable on IVIG 2 gm/kg every 6 weeks. She is currently on prednisone 10 mg/day and MTX10mg SQweekly. CKis elevated (811).    CK fluctuating from 847 to 854 to 702 to 811 with the last few infusions. Aldolaseis pending. Continue IVIG every 6 weeks.  Patient continues to follow up with Dr. [name].  Begin IVIG x4 days beginning today. 3.Elevated LFTs, resolved AST 36and ALT 44. Increase in liver function test correlates with increased CPK. CPK reflects activity of polymyositis. Continue to monitor on day 1 of each infusion cycle. 4.Normocytic anemia She has a mild stable normocytic anemia.  Hematocrit 34.5.  Hemoglobin 10.9.  MCV 91.0 on 05/25/2020.  Hematocrit 33.0.  Hemoglobin 10.5.  MCV 91.7 on 08/16/2020.  Hematocrit 33.5.  Hemoglobin 10.6.  MCV 92.0 on 09/27/2020. Ferritin205 with an iron sat 40% and a TIBC 278 on 11/25/2019. B12was 716 andfolate35.0. Reticwas 3.8%. Coombsnegative [id number]. Continue to  monitor. 5.   Begin IVIG over the next 4 days. 6.   RTC in 6 weeks for MD assessment, labs (CBC with diff, CMP, CK, aldolase) and IVIG over 4 days.  I discussed the assessment and treatment plan with the patient.  The patient was provided an opportunity to ask questions and all were answered.  The patient agreed with the plan and demonstrated an understanding of the instructions.  The patient was advised to call back if the symptoms worsen or if the condition fails to improve as anticipated.    Lequita Asal, MD, PhD    09/27/2020, 9:11 AM   I, Mirian Mo Tufford, am acting as a Education administrator for [employer]. Mike Gip, MD.   I, Clint Strupp C. Mike Gip, MD, have reviewed the above documentation for accuracy and completeness, and I agree with the above.

## 2020-09-27 ENCOUNTER — Ambulatory Visit: Payer: Medicare PPO

## 2020-09-27 ENCOUNTER — Other Ambulatory Visit: Payer: Self-pay

## 2020-09-27 ENCOUNTER — Encounter: Payer: Self-pay | Admitting: Hematology and Oncology

## 2020-09-27 ENCOUNTER — Ambulatory Visit (HOSPITAL_BASED_OUTPATIENT_CLINIC_OR_DEPARTMENT_OTHER): Payer: Medicare PPO | Admitting: Hematology and Oncology

## 2020-09-27 ENCOUNTER — Inpatient Hospital Stay: Payer: Medicare PPO | Attending: Hematology and Oncology

## 2020-09-27 VITALS — BP 158/80 | HR 77 | Temp 97.6°F | Wt 195.8 lb

## 2020-09-27 VITALS — BP 134/76 | HR 71 | Temp 97.0°F | Resp 18

## 2020-09-27 DIAGNOSIS — R7989 Other specified abnormal findings of blood chemistry: Secondary | ICD-10-CM

## 2020-09-27 DIAGNOSIS — M3320 Polymyositis, organ involvement unspecified: Secondary | ICD-10-CM

## 2020-09-27 DIAGNOSIS — D649 Anemia, unspecified: Secondary | ICD-10-CM | POA: Diagnosis not present

## 2020-09-27 LAB — COMPREHENSIVE METABOLIC PANEL
ALT: 44 U/L (ref 0–44)
AST: 36 U/L (ref 15–41)
Albumin: 3.6 g/dL (ref 3.5–5.0)
Alkaline Phosphatase: 43 U/L (ref 38–126)
Anion gap: 9 (ref 5–15)
BUN: 19 mg/dL (ref 8–23)
CO2: 28 mmol/L (ref 22–32)
Calcium: 9.4 mg/dL (ref 8.9–10.3)
Chloride: 102 mmol/L (ref 98–111)
Creatinine, Ser: 0.96 mg/dL (ref 0.44–1.00)
GFR calc Af Amer: >60 mL/min (ref >60)
GFR calc non Af Amer: 59 mL/min — ABNORMAL LOW (ref >60)
Glucose, Bld: 189 mg/dL — ABNORMAL HIGH (ref 70–99)
Potassium: 3.6 mmol/L (ref 3.5–5.1)
Sodium: 139 mmol/L (ref 135–145)
Total Bilirubin: 1 mg/dL (ref 0.3–1.2)
Total Protein: 6.9 g/dL (ref 6.5–8.1)

## 2020-09-27 LAB — CBC WITH DIFFERENTIAL/PLATELET
Abs Immature Granulocytes: 0.03 10*3/uL (ref 0.00–0.07)
Basophils Absolute: 0 10*3/uL (ref 0.0–0.1)
Basophils Relative: 1 %
Eosinophils Absolute: 0.1 10*3/uL (ref 0.0–0.5)
Eosinophils Relative: 1 %
HCT: 33.5 % — ABNORMAL LOW (ref 36.0–46.0)
Hemoglobin: 10.6 g/dL — ABNORMAL LOW (ref 12.0–15.0)
Immature Granulocytes: 0 %
Lymphocytes Relative: 24 %
Lymphs Abs: 1.9 10*3/uL (ref 0.7–4.0)
MCH: 29.1 pg (ref 26.0–34.0)
MCHC: 31.6 g/dL (ref 30.0–36.0)
MCV: 92 fL (ref 80.0–100.0)
Monocytes Absolute: 0.4 10*3/uL (ref 0.1–1.0)
Monocytes Relative: 5 %
Neutro Abs: 5.3 10*3/uL (ref 1.7–7.7)
Neutrophils Relative %: 69 %
Platelets: 291 10*3/uL (ref 150–400)
RBC: 3.64 MIL/uL — ABNORMAL LOW (ref 3.87–5.11)
RDW: 14.4 % (ref 11.5–15.5)
WBC: 7.7 10*3/uL (ref 4.0–10.5)
nRBC: 0 % (ref 0.0–0.2)

## 2020-09-27 LAB — CK: Total CK: 811 U/L — ABNORMAL HIGH (ref 38–234)

## 2020-09-27 MED ORDER — DIPHENHYDRAMINE HCL 25 MG PO CAPS
ORAL_CAPSULE | ORAL | Status: AC
  Filled 2020-09-27: qty 1

## 2020-09-27 MED ORDER — IMMUNE GLOBULIN (HUMAN) 20 GM/200ML IV SOLN
40.0000 g | INTRAVENOUS | Status: DC
  Administered 2020-09-27: 40 g via INTRAVENOUS
  Filled 2020-09-27: qty 400

## 2020-09-27 MED ORDER — ACETAMINOPHEN 325 MG PO TABS
650.0000 mg | ORAL_TABLET | Freq: Once | ORAL | Status: AC
  Administered 2020-09-27: 650 mg via ORAL
  Filled 2020-09-27: qty 2

## 2020-09-27 MED ORDER — DIPHENHYDRAMINE HCL 25 MG PO TABS
25.0000 mg | ORAL_TABLET | Freq: Once | ORAL | Status: AC
  Administered 2020-09-27: 25 mg via ORAL
  Filled 2020-09-27: qty 1

## 2020-09-27 MED ORDER — METHYLPREDNISOLONE SODIUM SUCC 125 MG IJ SOLR
40.0000 mg | INTRAMUSCULAR | Status: DC
  Administered 2020-09-27: 40 mg via INTRAVENOUS
  Filled 2020-09-27: qty 2

## 2020-09-27 MED ORDER — DEXTROSE 5 % IV SOLN
INTRAVENOUS | Status: DC
  Filled 2020-09-27: qty 250

## 2020-09-27 NOTE — Progress Notes (Signed)
No new changes noted today 

## 2020-09-28 ENCOUNTER — Inpatient Hospital Stay: Payer: Medicare PPO

## 2020-09-28 VITALS — BP 142/79 | HR 77 | Temp 98.2°F | Resp 18

## 2020-09-28 DIAGNOSIS — M3320 Polymyositis, organ involvement unspecified: Secondary | ICD-10-CM

## 2020-09-28 LAB — ALDOLASE: Aldolase: 10.8 U/L — ABNORMAL HIGH (ref 3.3–10.3)

## 2020-09-28 MED ORDER — DIPHENHYDRAMINE HCL 25 MG PO CAPS
ORAL_CAPSULE | ORAL | Status: AC
  Filled 2020-09-28: qty 1

## 2020-09-28 MED ORDER — ACETAMINOPHEN 325 MG PO TABS
650.0000 mg | ORAL_TABLET | Freq: Once | ORAL | Status: AC
  Administered 2020-09-28: 650 mg via ORAL
  Filled 2020-09-28: qty 2

## 2020-09-28 MED ORDER — DIPHENHYDRAMINE HCL 25 MG PO TABS
25.0000 mg | ORAL_TABLET | Freq: Once | ORAL | Status: AC
  Administered 2020-09-28: 25 mg via ORAL
  Filled 2020-09-28: qty 1

## 2020-09-28 MED ORDER — DEXTROSE 5 % IV SOLN
Freq: Once | INTRAVENOUS | Status: AC
  Filled 2020-09-28: qty 250

## 2020-09-28 MED ORDER — METHYLPREDNISOLONE SODIUM SUCC 125 MG IJ SOLR
40.0000 mg | INTRAMUSCULAR | Status: DC
  Administered 2020-09-28: 40 mg via INTRAVENOUS
  Filled 2020-09-28: qty 2

## 2020-09-28 MED ORDER — IMMUNE GLOBULIN (HUMAN) 20 GM/200ML IV SOLN
40.0000 g | INTRAVENOUS | Status: DC
  Administered 2020-09-28: 40 g via INTRAVENOUS
  Filled 2020-09-28: qty 400

## 2020-09-29 ENCOUNTER — Inpatient Hospital Stay: Payer: Medicare PPO

## 2020-09-29 ENCOUNTER — Other Ambulatory Visit: Payer: Self-pay

## 2020-09-29 VITALS — BP 147/80 | HR 78 | Temp 97.8°F | Resp 18

## 2020-09-29 DIAGNOSIS — M3320 Polymyositis, organ involvement unspecified: Secondary | ICD-10-CM

## 2020-09-29 MED ORDER — IMMUNE GLOBULIN (HUMAN) 20 GM/200ML IV SOLN
40.0000 g | INTRAVENOUS | Status: DC
  Administered 2020-09-29: 40 g via INTRAVENOUS
  Filled 2020-09-29: qty 400

## 2020-09-29 MED ORDER — DEXTROSE 5 % IV SOLN
INTRAVENOUS | Status: DC
  Filled 2020-09-29 (×2): qty 250

## 2020-09-29 MED ORDER — METHYLPREDNISOLONE SODIUM SUCC 125 MG IJ SOLR
40.0000 mg | INTRAMUSCULAR | Status: DC
  Administered 2020-09-29: 40 mg via INTRAVENOUS
  Filled 2020-09-29: qty 2

## 2020-09-29 MED ORDER — DIPHENHYDRAMINE HCL 25 MG PO CAPS
ORAL_CAPSULE | ORAL | Status: AC
  Filled 2020-09-29: qty 1

## 2020-09-29 MED ORDER — ACETAMINOPHEN 325 MG PO TABS
650.0000 mg | ORAL_TABLET | Freq: Once | ORAL | Status: AC
  Administered 2020-09-29: 650 mg via ORAL
  Filled 2020-09-29: qty 2

## 2020-09-29 MED ORDER — DIPHENHYDRAMINE HCL 25 MG PO TABS
25.0000 mg | ORAL_TABLET | Freq: Once | ORAL | Status: AC
  Administered 2020-09-29: 25 mg via ORAL
  Filled 2020-09-29: qty 1

## 2020-09-30 ENCOUNTER — Telehealth: Payer: Self-pay

## 2020-09-30 ENCOUNTER — Inpatient Hospital Stay: Payer: Medicare PPO

## 2020-09-30 VITALS — BP 156/83 | HR 74 | Temp 97.0°F | Resp 18

## 2020-09-30 DIAGNOSIS — G629 Polyneuropathy, unspecified: Secondary | ICD-10-CM | POA: Diagnosis not present

## 2020-09-30 DIAGNOSIS — M3320 Polymyositis, organ involvement unspecified: Secondary | ICD-10-CM

## 2020-09-30 DIAGNOSIS — M3322 Polymyositis with myopathy: Secondary | ICD-10-CM | POA: Diagnosis not present

## 2020-09-30 DIAGNOSIS — I10 Essential (primary) hypertension: Secondary | ICD-10-CM

## 2020-09-30 DIAGNOSIS — E119 Type 2 diabetes mellitus without complications: Secondary | ICD-10-CM | POA: Diagnosis not present

## 2020-09-30 DIAGNOSIS — M5416 Radiculopathy, lumbar region: Secondary | ICD-10-CM | POA: Diagnosis not present

## 2020-09-30 MED ORDER — DIPHENHYDRAMINE HCL 25 MG PO TABS
25.0000 mg | ORAL_TABLET | Freq: Once | ORAL | Status: AC
  Administered 2020-09-30: 25 mg via ORAL
  Filled 2020-09-30: qty 1

## 2020-09-30 MED ORDER — DIPHENHYDRAMINE HCL 25 MG PO CAPS
ORAL_CAPSULE | ORAL | Status: AC
  Filled 2020-09-30: qty 1

## 2020-09-30 MED ORDER — ACETAMINOPHEN 325 MG PO TABS
650.0000 mg | ORAL_TABLET | Freq: Once | ORAL | Status: AC
  Administered 2020-09-30: 650 mg via ORAL
  Filled 2020-09-30: qty 2

## 2020-09-30 MED ORDER — IMMUNE GLOBULIN (HUMAN) 20 GM/200ML IV SOLN
40.0000 g | INTRAVENOUS | Status: DC
  Administered 2020-09-30: 40 g via INTRAVENOUS
  Filled 2020-09-30: qty 400

## 2020-09-30 MED ORDER — DEXTROSE 5 % IV SOLN
INTRAVENOUS | Status: DC
  Filled 2020-09-30: qty 250

## 2020-09-30 MED ORDER — LISINOPRIL 10 MG PO TABS: 10.0000 mg | ORAL_TABLET | Freq: Every day | ORAL | 3 refills | Status: DC

## 2020-09-30 MED ORDER — METHYLPREDNISOLONE SODIUM SUCC 125 MG IJ SOLR
40.0000 mg | INTRAMUSCULAR | Status: DC
  Administered 2020-09-30: 40 mg via INTRAVENOUS
  Filled 2020-09-30: qty 2

## 2020-09-30 NOTE — Telephone Encounter (Signed)
Patient called wanted to know if she should be taking lisinopril. I see in note that you wanted her to continue but has not been filled after 2019. Patient states she has not seen a bottle of it or anything in a "long time"

## 2020-09-30 NOTE — Telephone Encounter (Signed)
Her blood pressure has been running high---so send Rx and have her restart it

## 2020-09-30 NOTE — Telephone Encounter (Signed)
Spoke to pt. Sent in rx for lisinopril.

## 2020-10-27 ENCOUNTER — Other Ambulatory Visit: Payer: Self-pay | Admitting: Internal Medicine

## 2020-10-28 ENCOUNTER — Encounter: Payer: Medicare PPO | Admitting: Internal Medicine

## 2020-11-04 NOTE — Progress Notes (Signed)
[facility]  [address] [place], [place] [address] Phone: [phone]  Fax: [phone]   Clinic Day:  11/08/2020  Referring physician: [name], MD  Chief Complaint: Bridget Romero is a 74 y.o. 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  HPI: The patient was last seen in the hematology clinic on 09/27/2020. At that time, she was doing well.  She denied any lower extremity weakness.  Exam was stable. Hematocrit was 33.5, hemoglobin 10.6, platelets 291,000, WBC 7,700. Creatinine was 0.96 (CrCl 59 ml/min). AST was 36 and ALT 44.  Aldolase was 10.8. CK was 811.  She received IVIG x 4 days (09/27/2020 - 09/30/2020).  The patient saw Dr. [name] on 09/30/2020. Atorvastatin was stopped.  She was to continue prednisone, MTX and IVIG at the same frequency.  She has a follow-up after 2 cycles (12 weeks).  During the interim, she has been okay. The back of her left leg is still tight. She is able to get up from a sitting position without using her arms. Her sense of taste is improving. Her diabetes is "good."   She is taking 10 mg prednisone a day. She receives methotrexate injections. She started on a medication for an overactive bladder, which has helped.   Past Medical History:  Diagnosis Date  . Allergy   . Asthma   . Diabetes mellitus   . Hyperlipidemia   . Hypertension   . Neuromuscular disorder (HCC)    polymyositis  . Osteoarthritis   . Vitamin B12 deficiency     Past Surgical History:  Procedure Laterality Date  . COLONOSCOPY WITH PROPOFOL N/A 03/07/2016   Procedure: COLONOSCOPY WITH PROPOFOL;  Surgeon: Lollie Sails, MD;  Location: [facility];  Service: Endoscopy;  Laterality: N/A;    Family History  Problem Relation Age of Onset  . Heart disease Mother   . Cancer Father   . Breast cancer Neg Hx     Social History:  reports that she has quit smoking. She has never  used smokeless tobacco. She reports that she does not drink alcohol and does not use drugs. She is a Training and development officer. She lives in [place]. The patient is alone today.   Allergies:  Allergies  Allergen Reactions  . Atorvastatin Other (See Comments)    myalgias  . Pravastatin Other (See Comments)    Muscle pain    Current Medications: Current Outpatient Medications  Medication Sig Dispense Refill  . ACCU-CHEK FASTCLIX LANCETS MISC Use to test blood sugar twice daily dx: 250.02 100 each 3  . acetaminophen (TYLENOL) 500 MG tablet Take 500 mg by mouth every 6 (six) hours as needed.    . Alcohol Swabs (B-D SINGLE USE SWABS REGULAR) PADS Use to test blood sugar once daily dx: 250.00 100 each 3  . atorvastatin (LIPITOR) 10 MG tablet TAKE 1 TABLET BY MOUTH ONCE DAILY 90 tablet 3  . B-D TB SYRINGE 1CC/25GX5/8" 25G X 5/8" 1 ML MISC     . Blood Glucose Calibration (ACCU-CHEK AVIVA) SOLN Use as directed 1 each 3  . Cholecalciferol (VITAMIN D) 1000 UNITS capsule Take 1,000 Units by mouth daily.      . folic acid (FOLVITE) 1 MG tablet Take 1 mg by mouth daily.    Marland Kitchen gabapentin (NEURONTIN) 300 MG capsule Take 300 mg by mouth 3 (three) times daily.     Marland Kitchen glucose blood (ACCU-CHEK SMARTVIEW) test strip Use to check blood  sugar twice a day Dx Code E11.4 100 each 6  . LANTUS 100 UNIT/ML injection INJECT 10 TO 20 UNITS INTO THE SKIN DAILY AS DIRECTED 10 mL 11  . lisinopril (ZESTRIL) 10 MG tablet Take 1 tablet (10 mg total) by mouth daily. 90 tablet 3  . metFORMIN (GLUCOPHAGE) 1000 MG tablet TAKE 1 TABLET BY MOUTH TWICE A DAY 180 tablet 3  . methotrexate 50 MG/2ML injection Inject into the skin once a week.     . Multiple Vitamin (MULTIVITAMIN) tablet Take 1 tablet by mouth daily.      . predniSONE (DELTASONE) 10 MG tablet Take 5 mg by mouth daily.     Marland Kitchen alendronate (FOSAMAX) 70 MG tablet TAKE 1 TABLET BY MOUTH EVERY 7 DAYS WITHA FULL GLASS OF WATER. (DO NOT LIE DOWN FOR THE NEXT 30 MINUTES) (Patient not taking:  Reported on 09/01/2020) 12 tablet 3  . methotrexate 250 MG/10ML injection Inject into the skin. (Patient not taking: Reported on 09/01/2020)    . tiZANidine (ZANAFLEX) 2 MG tablet Take by mouth. (Patient not taking: Reported on 09/01/2020)    . tolterodine (DETROL LA) 4 MG 24 hr capsule Take 1 capsule (4 mg total) by mouth daily. (Patient not taking: Reported on 09/27/2020) 30 capsule 2  . traMADol (ULTRAM) 50 MG tablet Take by mouth.  (Patient not taking: Reported on 09/01/2020)     No current facility-administered medications for this visit.    Review of Systems  Constitutional: Negative.  Negative for chills, diaphoresis, fever, malaise/fatigue and weight loss (up 2 lbs).       Feels "pretty good".  HENT: Negative.  Negative for congestion, ear discharge, ear pain, hearing loss, nosebleeds, sinus pain, sore throat and tinnitus.   Eyes: Negative.  Negative for blurred vision, double vision and photophobia.  Respiratory: Negative.  Negative for cough, hemoptysis, sputum production and shortness of breath.   Cardiovascular: Negative.  Negative for chest pain, palpitations, orthopnea and leg swelling.  Gastrointestinal: Negative for abdominal pain, blood in stool, constipation, diarrhea, heartburn, melena, nausea and vomiting.       Taste is improving.  Genitourinary: Negative for dysuria, frequency, hematuria and urgency.       On meds for an overactive bladder.  Musculoskeletal: Negative for back pain, joint pain, myalgias and neck pain.       Back of the left leg feels tight.  Skin: Negative.  Negative for itching and rash.  Neurological: Negative.  Negative for dizziness, tingling, sensory change, focal weakness, weakness and headaches.  Endo/Heme/Allergies: Does not bruise/bleed easily.       Diabetes on insulin and Metformin.   Psychiatric/Behavioral: Negative.  Negative for depression and memory loss. The patient is not nervous/anxious and does not have insomnia.   All other systems reviewed  and are negative.  Performance status (ECOG): 1  Vitals Blood pressure (!) 166/77, pulse 76, temperature 97.7 F (36.5 C), temperature source Tympanic, resp. rate 18, height 5\' 6"  (1.676 m), weight 197 lb 6.8 oz (89.5 kg), SpO2 100 %.   Physical Exam Vitals and nursing note reviewed.  Constitutional:      General: She is not in acute distress.    Appearance: Normal appearance. She is well-developed. She is not ill-appearing or diaphoretic.  HENT:     Head: Normocephalic and atraumatic.     Comments: Short dark hair.    Mouth/Throat:     Pharynx: No oropharyngeal exudate or posterior oropharyngeal erythema.  Eyes:     General: No  scleral icterus.    Conjunctiva/sclera: Conjunctivae normal.     Pupils: Pupils are equal, round, and reactive to light.     Comments: Brown eyes.  Cardiovascular:     Rate and Rhythm: Normal rate and regular rhythm.     Heart sounds: Normal heart sounds. No murmur heard.   Pulmonary:     Effort: Pulmonary effort is normal. No respiratory distress.     Breath sounds: Normal breath sounds. No wheezing or rales.  Chest:     Chest wall: No tenderness.  Abdominal:     General: Bowel sounds are normal. There is no distension.     Palpations: Abdomen is soft. There is no mass.     Tenderness: There is no abdominal tenderness. There is no guarding or rebound.  Musculoskeletal:        General: No swelling or tenderness. Normal range of motion.     Cervical back: Normal range of motion and neck supple.  Lymphadenopathy:     Cervical: No cervical adenopathy.  Skin:    General: Skin is warm and dry.     Coloration: Skin is not jaundiced or pale.  Neurological:     Mental Status: She is alert and oriented to person, place, and time.     Sensory: No sensory deficit.     Motor: No weakness.     Comments: Lower extremity strength and sensation are symmetric and normal.  Psychiatric:        Behavior: Behavior normal.        Thought Content: Thought content  normal.        Judgment: Judgment normal.    Appointment on 11/08/2020  Component Date Value Ref Range Status  . Total CK 11/08/2020 304* 38.0 - 234.0 U/L Final   Performed at [facility], [address] [place], [place] [address]  . Sodium 11/08/2020 140  135 - 145 mmol/L Final  . Potassium 11/08/2020 3.7  3.5 - 5.1 mmol/L Final  . Chloride 11/08/2020 104  98 - 111 mmol/L Final  . CO2 11/08/2020 28  22 - 32 mmol/L Final  . Glucose, Bld 11/08/2020 192* 70 - 99 mg/dL Final   Glucose reference range applies only to samples taken after fasting for at least 8 hours.  . BUN 11/08/2020 20  8 - 23 mg/dL Final  . Creatinine, Ser 11/08/2020 0.70  0.44 - 1.00 mg/dL Final  . Calcium 11/08/2020 9.3  8.9 - 10.3 mg/dL Final  . Total Protein 11/08/2020 7.1  6.5 - 8.1 g/dL Final  . Albumin 11/08/2020 3.6  3.5 - 5.0 g/dL Final  . AST 11/08/2020 24  15 - 41 U/L Final  . ALT 11/08/2020 23  0 - 44 U/L Final  . Alkaline Phosphatase 11/08/2020 40  38 - 126 U/L Final  . Total Bilirubin 11/08/2020 0.5  0.3 - 1.2 mg/dL Final  . GFR, Estimated 11/08/2020 >60  >60 mL/min Final   Comment: (NOTE) Calculated using the CKD-EPI Creatinine Equation (2021)   . Anion gap 11/08/2020 8  5 - 15 Final   Performed at [facility], [address] [place], [place] [address]  . WBC 11/08/2020 7.0  4.0 - 10.5 K/uL Final  . RBC 11/08/2020 3.58* 3.87 - 5.11 MIL/uL Final  . Hemoglobin 11/08/2020 10.2* 12.0 - 15.0 g/dL Final  . HCT 11/08/2020 32.7* 36 - 46 % Final  . MCV 11/08/2020 91.3  80.0 - 100.0 fL Final  . MCH 11/08/2020 28.5  26.0 - 34.0 pg Final  . MCHC 11/08/2020 31.2  30.0 - 36.0 g/dL Final  . RDW 11/08/2020 14.7  11.5 - 15.5 % Final  . Platelets 11/08/2020 292  150 - 400 K/uL Final  . nRBC 11/08/2020 0.0  0.0 - 0.2 % Final  . Neutrophils Relative % 11/08/2020 64  % Final  . Neutro Abs 11/08/2020 4.4  1.7 - 7.7 K/uL Final  . Lymphocytes Relative 11/08/2020 29  % Final  . Lymphs Abs  11/08/2020 2.0  0.7 - 4.0 K/uL Final  . Monocytes Relative 11/08/2020 5  % Final  . Monocytes Absolute 11/08/2020 0.4  0.1 - 1.0 K/uL Final  . Eosinophils Relative 11/08/2020 1  % Final  . Eosinophils Absolute 11/08/2020 0.1  0.0 - 0.5 K/uL Final  . Basophils Relative 11/08/2020 0  % Final  . Basophils Absolute 11/08/2020 0.0  0.0 - 0.1 K/uL Final  . Immature Granulocytes 11/08/2020 1  % Final  . Abs Immature Granulocytes 11/08/2020 0.04  0.00 - 0.07 K/uL Final   Performed at [facility], [address] [place], [place] [address]    Assessment:  RICKELL WIEHE is a 74 y.o. 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 She receives IVIG monthly,methotrexate weekly, and prednisone5mg  a day.  CKhas been followed (38-234): >5555 on 05/30/2017, 4409 on 06/20/2017, 3546 on 07/05/2017, 2725 on 07/30/2017, 3675 on 08/28/2017, 2405 on 10/08/2017, 2743 on 11/08/2017, 2723 on 12/20/2017, 723 on 01/21/2018, 507 on 02/21/2018, 2192 on 04/11/2018, 2227 on 06/04/2018, 4468 on 07/22/2018, 2204 on 09/06/2018, 1360 on 12/02/2018, 1423 on 01/01/2019,912 on 03/03/2019, 3955 on 04/21/2019,4612 on 05/06/2019,4560 on 05/20/2019, 4532 on 06/16/2019,4935 on 07/10/2019,4330 on 07/28/2019, 3645 on 08/13/2019, 2514 on 08/25/2019, 807 on 09/22/2019, 251 on 10/21/2019, 67 on 11/25/2019, 66 on 12/22/2019, < 5 on 01/19/2020, 67 on 02/02/2020, 254 on 03/15/2020, 338 on 03/29/2020, 847 on 05/25/2020, 854 on 07/05/2020, 702 on 08/16/2020, 811 on 09/27/2020, and 304 on 11/08/2020.  Aldolasehas been followed (3.3-10.3): 67.2 in06/05/2017, 69.9 on06/27/2018, 64.8 on07/11/2017,57.6 on08/05/2017, 60.3 on09/03/2017, 38.7 on 12/20/2017, 9.3 on01/28/2019, 7.4 on02/28/2019, 29.1 on04/18/2019, to51.9 on07/29/2019, 23.7 on09/13/2019, 16.4 on 12/02/2018,14.1 on 01/01/2019,8.8 on 03/03/2019, 53.4 on 05/06/2019,42.9 on 05/20/2019, 39.0 in 06/16/2019, 69.3 on 07/28/2019, 37.2 on 08/25/2019, 12.5 on 09/22/2019, 7.2 on  10/21/2019, 5.2 on 11/25/2019, 4.8 on 12/22/2019, 3.9 on 01/19/2020, 4.2 on 02/02/2020, 5.5 on 03/15/2020, 5.6 on 03/29/2020, 9.1 on 05/25/2020, 11.6 on 07/05/2020, 11.4 on 08/16/2020, 10.8 on 09/27/2020, and 5.2 on 11/08/2020.  She received IVIG (Privigen) 75 gm x 2 days every 4 weeks beginning 12/07/2017.IVIG was decreased to every 8 weeks on 04/22/2019, butthen returned to monthly secondary to rise CK.IVIG wasincreased to 2 gm/kg on08/31/2020,09/23/2019, 10/21/2019, 11/25/2019, 02/02/2020, 03/29/2020, 05/25/2020, 07/05/2020, 08/16/2020, and 09/27/2020.  She was switched to every 8 weeks then every 6 weeks.  She has a normocytic anemia. Baseline hemoglobin is 10. Anemia work-upon 12/22/2019 revealed a hematocritwas32.4, hemoglobin 10.0, MCV 92.8, platelets 258,000, WBC 6200, ANC 3700. Reticwas 3.8%.[id number].Coombs was negative. Peripheralsmearrevealed anormocytic anemia. The morphologyof the RBCs, WBCs and platelets were within normal limits.  Last colonoscopy on 03/07/2016 revealed grade I hemorrhoids.She has B12 deficiencyand is on oral B12. B12 was 306 and folate >22.3 on 05/30/2017.   She has chronicmild elevation in LFTswhich have correlated with increased CPK.AST/ALThave been followed:53/54 on 02/17/2019, 107/128 on04/27/2020, 134/200 on05/26/2020,138/261 on 07/10/2019,151/304 on08/02/2019, 175/289 on 08/13/2019, 102/196 on 08/25/2019, 47/75 on 09/22/2019, 12/23 on 11/25/2019, 19/16 on 12/22/2019, 20/16 on 01/19/2020, 19/15 on 02/02/2020, 22/17 on 03/15/2020, 25/19 on 03/29/2020, 40/36 on  05/25/2020, 42/46 on 07/05/2020, 37/48 on 08/16/2020, and 36/44 on 09/27/2020. Hepatitis B and C testing werenegative on 06/27/2018and 07/28/2019.  Symptomatically, she has felt "pretty good". The back of her left leg is still tight. She is able to get up from a sitting position without using her arms.  Exam is stable.  Plan: 1.   Labs today: CBC with diff, CMP,  CK, aldolase. 2.Polymyositis Clinically, she is doing well on IVIG 2 gm/kg every 6 weeks. She remains on prednisone 10 mg/day and MTX10mg SQweekly. CKhas improved (304).  Aldolasehas improved (5.2). Continue IVIF every 6 weeks.  Patient continues to follow up with Dr. [name].  Labs reviewed.  Begin IVIG x4 days beginning today. 3.Elevated LFTs,resolved AST 24and ALT 23.  Increase in LFTs correlates to increased CPK. CPK reflects activity polymyositis. Continue to monitor with each cycle. 4.Normocytic anemia She continues to have a mild stable normocytic anemia.  Hematocrit 33.0.  Hemoglobin 10.5.  MCV 91.7 on 08/16/2020.  Hematocrit 33.5.  Hemoglobin 10.6.  MCV 92.0 on 09/27/2020.  Hematocrit 32.7.  Hemoglobin 10.2.  MCV 91.3 on 11/08/2020. Ferritin205 with an iron sat 40% and a TIBC 278 on 11/25/2019. B12was 716 andfolate35.0. Reticwas 3.8%. Coombsnegative [id number]. Continue to monitor. 5.   Begin IVIG over the next 4 days. 6.   RTC in 6 weeks for MD assessment, labs (CBC with diff, CMP, CK, aldolase) and IVIG over 4 days.  I discussed the assessment and treatment plan with the patient.  The patient was provided an opportunity to ask questions and all were answered.  The patient agreed with the plan and demonstrated an understanding of the instructions.  The patient was advised to call back if the symptoms worsen or if the condition fails to improve as anticipated.   Lequita Asal, MD, PhD    11/08/2020, 9:43 AM   I, Mirian Mo Tufford, am acting as a Education administrator for [employer]. Mike Gip, MD.   I, Eudell Julian C. Mike Gip, MD, have reviewed the above documentation for accuracy and completeness, and I agree with the above.

## 2020-11-08 ENCOUNTER — Inpatient Hospital Stay: Payer: Medicare PPO

## 2020-11-08 ENCOUNTER — Encounter: Payer: Self-pay | Admitting: Hematology and Oncology

## 2020-11-08 ENCOUNTER — Other Ambulatory Visit: Payer: Self-pay

## 2020-11-08 ENCOUNTER — Inpatient Hospital Stay: Payer: Medicare PPO | Attending: Hematology and Oncology | Admitting: Hematology and Oncology

## 2020-11-08 VITALS — BP 166/77 | HR 76 | Temp 97.7°F | Resp 18 | Ht 66.0 in | Wt 197.4 lb

## 2020-11-08 VITALS — BP 166/83 | HR 71

## 2020-11-08 DIAGNOSIS — D649 Anemia, unspecified: Secondary | ICD-10-CM

## 2020-11-08 DIAGNOSIS — M3320 Polymyositis, organ involvement unspecified: Secondary | ICD-10-CM | POA: Diagnosis not present

## 2020-11-08 LAB — CBC WITH DIFFERENTIAL/PLATELET
Abs Immature Granulocytes: 0.04 10*3/uL (ref 0.00–0.07)
Basophils Absolute: 0 10*3/uL (ref 0.0–0.1)
Basophils Relative: 0 %
Eosinophils Absolute: 0.1 10*3/uL (ref 0.0–0.5)
Eosinophils Relative: 1 %
HCT: 32.7 % — ABNORMAL LOW (ref 36.0–46.0)
Hemoglobin: 10.2 g/dL — ABNORMAL LOW (ref 12.0–15.0)
Immature Granulocytes: 1 %
Lymphocytes Relative: 29 %
Lymphs Abs: 2 10*3/uL (ref 0.7–4.0)
MCH: 28.5 pg (ref 26.0–34.0)
MCHC: 31.2 g/dL (ref 30.0–36.0)
MCV: 91.3 fL (ref 80.0–100.0)
Monocytes Absolute: 0.4 10*3/uL (ref 0.1–1.0)
Monocytes Relative: 5 %
Neutro Abs: 4.4 10*3/uL (ref 1.7–7.7)
Neutrophils Relative %: 64 %
Platelets: 292 10*3/uL (ref 150–400)
RBC: 3.58 MIL/uL — ABNORMAL LOW (ref 3.87–5.11)
RDW: 14.7 % (ref 11.5–15.5)
WBC: 7 10*3/uL (ref 4.0–10.5)
nRBC: 0 % (ref 0.0–0.2)

## 2020-11-08 LAB — COMPREHENSIVE METABOLIC PANEL
ALT: 23 U/L (ref 0–44)
AST: 24 U/L (ref 15–41)
Albumin: 3.6 g/dL (ref 3.5–5.0)
Alkaline Phosphatase: 40 U/L (ref 38–126)
Anion gap: 8 (ref 5–15)
BUN: 20 mg/dL (ref 8–23)
CO2: 28 mmol/L (ref 22–32)
Calcium: 9.3 mg/dL (ref 8.9–10.3)
Chloride: 104 mmol/L (ref 98–111)
Creatinine, Ser: 0.7 mg/dL (ref 0.44–1.00)
GFR, Estimated: >60 mL/min (ref >60)
Glucose, Bld: 192 mg/dL — ABNORMAL HIGH (ref 70–99)
Potassium: 3.7 mmol/L (ref 3.5–5.1)
Sodium: 140 mmol/L (ref 135–145)
Total Bilirubin: 0.5 mg/dL (ref 0.3–1.2)
Total Protein: 7.1 g/dL (ref 6.5–8.1)

## 2020-11-08 LAB — CK: Total CK: 304 U/L — ABNORMAL HIGH (ref 38–234)

## 2020-11-08 MED ORDER — METHYLPREDNISOLONE SODIUM SUCC 125 MG IJ SOLR
40.0000 mg | INTRAMUSCULAR | Status: DC
  Administered 2020-11-08: 40 mg via INTRAVENOUS
  Filled 2020-11-08: qty 2

## 2020-11-08 MED ORDER — DIPHENHYDRAMINE HCL 25 MG PO TABS
25.0000 mg | ORAL_TABLET | Freq: Once | ORAL | Status: AC
  Administered 2020-11-08: 25 mg via ORAL
  Filled 2020-11-08: qty 1

## 2020-11-08 MED ORDER — DEXTROSE 5 % IV SOLN
INTRAVENOUS | Status: DC
  Filled 2020-11-08: qty 250

## 2020-11-08 MED ORDER — IMMUNE GLOBULIN (HUMAN) 20 GM/200ML IV SOLN
40.0000 g | INTRAVENOUS | Status: DC
  Administered 2020-11-08: 40 g via INTRAVENOUS
  Filled 2020-11-08: qty 400

## 2020-11-08 MED ORDER — ACETAMINOPHEN 325 MG PO TABS
650.0000 mg | ORAL_TABLET | Freq: Once | ORAL | Status: AC
  Administered 2020-11-08: 650 mg via ORAL
  Filled 2020-11-08: qty 2

## 2020-11-08 NOTE — Progress Notes (Signed)
Patient received IVIG in clinic today; tolerated well; discharged in stable condition.

## 2020-11-08 NOTE — Progress Notes (Signed)
The patient c/o left leg pain ( 6 )

## 2020-11-09 ENCOUNTER — Telehealth: Payer: Self-pay

## 2020-11-09 ENCOUNTER — Inpatient Hospital Stay: Payer: Medicare PPO

## 2020-11-09 VITALS — BP 182/81 | HR 72 | Temp 97.4°F | Resp 18

## 2020-11-09 DIAGNOSIS — M3320 Polymyositis, organ involvement unspecified: Secondary | ICD-10-CM

## 2020-11-09 LAB — ALDOLASE: Aldolase: 5.2 U/L (ref 3.3–10.3)

## 2020-11-09 MED ORDER — DIPHENHYDRAMINE HCL 25 MG PO CAPS
25.0000 mg | ORAL_CAPSULE | Freq: Once | ORAL | Status: AC
  Administered 2020-11-09: 25 mg via ORAL
  Filled 2020-11-09: qty 1

## 2020-11-09 MED ORDER — DEXTROSE 5 % IV SOLN
INTRAVENOUS | Status: DC
  Filled 2020-11-09: qty 250

## 2020-11-09 MED ORDER — IMMUNE GLOBULIN (HUMAN) 20 GM/200ML IV SOLN
40.0000 g | Freq: Once | INTRAVENOUS | Status: AC
  Administered 2020-11-09: 40 g via INTRAVENOUS
  Filled 2020-11-09: qty 400

## 2020-11-09 MED ORDER — ACETAMINOPHEN 325 MG PO TABS
650.0000 mg | ORAL_TABLET | Freq: Once | ORAL | Status: AC
  Administered 2020-11-09: 650 mg via ORAL
  Filled 2020-11-09: qty 2

## 2020-11-09 MED ORDER — METHYLPREDNISOLONE SODIUM SUCC 125 MG IJ SOLR
40.0000 mg | Freq: Once | INTRAMUSCULAR | Status: AC
  Administered 2020-11-09: 40 mg via INTRAVENOUS
  Filled 2020-11-09: qty 2

## 2020-11-09 NOTE — Progress Notes (Signed)
Patient received IVIG in clinic; tolerated well; discharged in stable condition.

## 2020-11-09 NOTE — Telephone Encounter (Signed)
-----   Message from Lequita Asal, MD sent at 11/09/2020  4:27 PM EST ----- Regarding: Please send to Dr Precious Reel  ----- Message ----- From: Buel Ream, Lab In [place] Sent: 11/08/2020   8:39 AM EST To: Lequita Asal, MD

## 2020-11-09 NOTE — Telephone Encounter (Signed)
Labs sent to dr [name]

## 2020-11-10 ENCOUNTER — Encounter: Payer: Self-pay | Admitting: Internal Medicine

## 2020-11-10 ENCOUNTER — Inpatient Hospital Stay: Payer: Medicare PPO

## 2020-11-10 ENCOUNTER — Ambulatory Visit (INDEPENDENT_AMBULATORY_CARE_PROVIDER_SITE_OTHER): Payer: Medicare PPO | Admitting: Internal Medicine

## 2020-11-10 ENCOUNTER — Other Ambulatory Visit: Payer: Self-pay

## 2020-11-10 VITALS — BP 171/83 | HR 88 | Temp 97.4°F | Resp 18

## 2020-11-10 VITALS — BP 134/84 | HR 89 | Temp 96.4°F | Ht 66.0 in | Wt 195.0 lb

## 2020-11-10 DIAGNOSIS — M3320 Polymyositis, organ involvement unspecified: Secondary | ICD-10-CM | POA: Diagnosis not present

## 2020-11-10 DIAGNOSIS — Z794 Long term (current) use of insulin: Secondary | ICD-10-CM | POA: Diagnosis not present

## 2020-11-10 DIAGNOSIS — Z0000 Encounter for general adult medical examination without abnormal findings: Secondary | ICD-10-CM | POA: Diagnosis not present

## 2020-11-10 DIAGNOSIS — E1140 Type 2 diabetes mellitus with diabetic neuropathy, unspecified: Secondary | ICD-10-CM | POA: Diagnosis not present

## 2020-11-10 DIAGNOSIS — M545 Low back pain, unspecified: Secondary | ICD-10-CM | POA: Diagnosis not present

## 2020-11-10 DIAGNOSIS — G8929 Other chronic pain: Secondary | ICD-10-CM

## 2020-11-10 DIAGNOSIS — I10 Essential (primary) hypertension: Secondary | ICD-10-CM

## 2020-11-10 DIAGNOSIS — Z7189 Other specified counseling: Secondary | ICD-10-CM

## 2020-11-10 LAB — HM DIABETES FOOT EXAM

## 2020-11-10 LAB — POCT GLYCOSYLATED HEMOGLOBIN (HGB A1C): Hemoglobin A1C: 9.1 % — AB (ref 4.0–5.6)

## 2020-11-10 MED ORDER — METHYLPREDNISOLONE SODIUM SUCC 125 MG IJ SOLR
40.0000 mg | INTRAMUSCULAR | Status: DC
  Administered 2020-11-10: 40 mg via INTRAVENOUS
  Filled 2020-11-10: qty 2

## 2020-11-10 MED ORDER — DIPHENHYDRAMINE HCL 25 MG PO CAPS
ORAL_CAPSULE | ORAL | Status: AC
  Filled 2020-11-10: qty 1

## 2020-11-10 MED ORDER — DEXTROSE 5 % IV SOLN
INTRAVENOUS | Status: DC
  Filled 2020-11-10: qty 250

## 2020-11-10 MED ORDER — INSULIN GLARGINE 100 UNIT/ML ~~LOC~~ SOLN
20.0000 [IU] | Freq: Every day | SUBCUTANEOUS | 0 refills | Status: DC
Start: 2020-11-10 — End: 2021-05-17

## 2020-11-10 MED ORDER — IMMUNE GLOBULIN (HUMAN) 20 GM/200ML IV SOLN
40.0000 g | INTRAVENOUS | Status: DC
  Administered 2020-11-10: 40 g via INTRAVENOUS
  Filled 2020-11-10: qty 400

## 2020-11-10 MED ORDER — DIPHENHYDRAMINE HCL 25 MG PO TABS
25.0000 mg | ORAL_TABLET | Freq: Once | ORAL | Status: AC
  Administered 2020-11-10: 25 mg via ORAL
  Filled 2020-11-10: qty 1

## 2020-11-10 MED ORDER — ACETAMINOPHEN 325 MG PO TABS
650.0000 mg | ORAL_TABLET | Freq: Once | ORAL | Status: AC
  Administered 2020-11-10: 650 mg via ORAL
  Filled 2020-11-10: qty 2

## 2020-11-10 NOTE — Assessment & Plan Note (Signed)
BP Readings from Last 3 Encounters:  11/10/20 134/84  11/09/20 (!) 182/81  11/08/20 (!) 166/83   Tends to be high when she goes in for infusions Fine here today on lisinopril If persistent elevations, would increase the lisinopril to 20mg 

## 2020-11-10 NOTE — Assessment & Plan Note (Signed)
Uses tylenol Has seen physiatry as well

## 2020-11-10 NOTE — Assessment & Plan Note (Signed)
I have personally reviewed the Medicare Annual Wellness questionnaire and have noted 1. The patient's medical and social history 2. Their use of alcohol, tobacco or illicit drugs 3. Their current medications and supplements 4. The patient's functional ability including ADL's, fall risks, home safety risks and hearing or visual             impairment. 5. Diet and physical activities 6. Evidence for depression or mood disorders  The patients weight, height, BMI and visual acuity have been recorded in the chart I have made referrals, counseling and provided education to the patient based review of the above and I have provided the pt with a written personalized care plan for preventive services.  I have provided you with a copy of your personalized plan for preventive services. Please take the time to review along with your updated medication list.  Consider last colon in 2027 Mammogram every 2 years----done in April No pap due to age Had flu vaccine and COVID booster Td if any injury Discussed adding aerobic exercise

## 2020-11-10 NOTE — Assessment & Plan Note (Addendum)
Seems to still have good control on lantus and metformin bid Will check A1c  Lab Results  Component Value Date   HGBA1C 9.1 (A) 11/10/2020   Discussed increasing insulin to 20 units from 10 and being careful with her eating Will just recheck at next visit

## 2020-11-10 NOTE — Progress Notes (Signed)
Subjective:    Patient ID: Bridget Romero, female    DOB: 06-30-1946, 74 y.o.   MRN: [id number]  HPI Here for Medicare wellness visit and follow up of chronic health conditions This visit occurred during the SARS-CoV-2 public health emergency.  Safety protocols were in place, including screening questions prior to the visit, additional usage of staff PPE, and extensive cleaning of exam room while observing appropriate contact time as indicated for disinfecting solutions.   Reviewed advanced directives Reviewed other doctors--Dr Dodd--dentist, [facility]--rheumatology, Dr Cook--neurosurgery, Dr Corcoran--hematologist, Dr Georgina Pillion No hospitalizations or surgery this year No tobacco or alcohol Does resistance training exercises and occasionally walks Vision is okay---early cataracts No hearing problems No falls Rare down days---nothing persistent. Not anhedonic Independent with instrumental ADLs No sig memory problems  BP was high during infusion therapy--- [phone] systolic Did come down after During IV gammaglobulin Rx Better today Checks BP at home sometimes---can be 150 (but wrist cuff) No headaches No chest pain or SOB No dizziness or syncope  Still has some urgency at night Tried the tolteradine---may have helped slightly Finished it---will not continue after discussion No dysuria  Polymyositis is controlled IM methotrexate and IV gamma globulin Prednisone 10mg  Completely independent with activities--no functional weakness Taken off statin due to concern about it affecting myositis  Checks sugars daily and occasionally bid 110-140 fasting usually Sensory changed in feet ---pain not an issue on gabapentin  Current Outpatient Medications on File Prior to Visit  Medication Sig Dispense Refill  . ACCU-CHEK FASTCLIX LANCETS MISC Use to test blood sugar twice daily dx: 250.02 100 each 3  . acetaminophen (TYLENOL) 500 MG tablet Take 500 mg by mouth every 6  (six) hours as needed.    . Alcohol Swabs (B-D SINGLE USE SWABS REGULAR) PADS Use to test blood sugar once daily dx: 250.00 100 each 3  . atorvastatin (LIPITOR) 10 MG tablet TAKE 1 TABLET BY MOUTH ONCE DAILY 90 tablet 3  . B-D TB SYRINGE 1CC/25GX5/8" 25G X 5/8" 1 ML MISC     . Blood Glucose Calibration (ACCU-CHEK AVIVA) SOLN Use as directed 1 each 3  . Cholecalciferol (VITAMIN D) 1000 UNITS capsule Take 1,000 Units by mouth daily.      . folic acid (FOLVITE) 1 MG tablet Take 1 mg by mouth daily.    Marland Kitchen gabapentin (NEURONTIN) 300 MG capsule Take 300 mg by mouth 3 (three) times daily.     Marland Kitchen glucose blood (ACCU-CHEK SMARTVIEW) test strip Use to check blood sugar twice a day Dx Code E11.4 100 each 6  . LANTUS 100 UNIT/ML injection INJECT 10 TO 20 UNITS INTO THE SKIN DAILY AS DIRECTED 10 mL 11  . lisinopril (ZESTRIL) 10 MG tablet Take 1 tablet (10 mg total) by mouth daily. 90 tablet 3  . metFORMIN (GLUCOPHAGE) 1000 MG tablet TAKE 1 TABLET BY MOUTH TWICE A DAY 180 tablet 3  . methotrexate 250 MG/10ML injection Inject into the skin.     . Multiple Vitamin (MULTIVITAMIN) tablet Take 1 tablet by mouth daily.      . predniSONE (DELTASONE) 10 MG tablet Take 10 mg by mouth daily.      No current facility-administered medications on file prior to visit.    Allergies  Allergen Reactions  . Atorvastatin Other (See Comments)    myalgias  . Pravastatin Other (See Comments)    Muscle pain    Past Medical History:  Diagnosis Date  . Allergy   . Asthma   .  Diabetes mellitus   . Hyperlipidemia   . Hypertension   . Neuromuscular disorder (HCC)    polymyositis  . Osteoarthritis   . Vitamin B12 deficiency     Past Surgical History:  Procedure Laterality Date  . COLONOSCOPY WITH PROPOFOL N/A 03/07/2016   Procedure: COLONOSCOPY WITH PROPOFOL;  Surgeon: Lollie Sails, MD;  Location: [facility] ENDOSCOPY;  Service: Endoscopy;  Laterality: N/A;    Family History  Problem Relation Age of Onset  . Heart  disease Mother   . Cancer Father   . Breast cancer Neg Hx     Social History   Socioeconomic History  . Marital status: Widowed    Spouse name: Not on file  . Number of children: 2  . Years of education: Not on file  . Highest education level: Not on file  Occupational History  . Occupation: Retired as Training and development officer at [employer]:    Tobacco Use  . Smoking status: Former Research scientist (life sciences)  . Smokeless tobacco: Never Used  . Tobacco comment: age 41 when stopped smoking  Vaping Use  . Vaping Use: Never used  Substance and Sexual Activity  . Alcohol use: No  . Drug use: No  . Sexual activity: Not on file  Other Topics Concern  . Not on file  Social History Narrative   has living will   Son and daughter should make health care decisions for her   Would accept resuscitation   Not sure about tube feeds   Social Determinants of Health   Financial Resource Strain:   . Difficulty of Paying Living Expenses: Not on file  Food Insecurity:   . Worried About Charity fundraiser in the Last Year: Not on file  . Ran Out of Food in the Last Year: Not on file  Transportation Needs:   . Lack of Transportation (Medical): Not on file  . Lack of Transportation (Non-Medical): Not on file  Physical Activity:   . Days of Exercise per Week: Not on file  . Minutes of Exercise per Session: Not on file  Stress:   . Feeling of Stress : Not on file  Social Connections:   . Frequency of Communication with Friends and Family: Not on file  . Frequency of Social Gatherings with Friends and Family: Not on file  . Attends Religious Services: Not on file  . Active Member of Clubs or Organizations: Not on file  . Attends Archivist Meetings: Not on file  . Marital Status: Not on file  Intimate Partner Violence:   . Fear of Current or Ex-Partner: Not on file  . Emotionally Abused: Not on file  . Physically Abused: Not on file  . Sexually Abused: Not on file   Review of  Systems Appetite is good---abnormal taste persists Weight is back up ~10# in past year Sleeps okay--nocturia x 2 Wears seat belt Teeth okay---jaw pain is better No heartburn or dysphagia Bowels move fine--no blood Some ongoing back pain--tylenol helps No skin lesions or rash    Objective:   Physical Exam HENT:     Mouth/Throat:     Comments: No lesions Full upper plate Eyes:     Conjunctiva/sclera: Conjunctivae normal.     Pupils: Pupils are equal, round, and reactive to light.  Pulmonary:     Effort: Pulmonary effort is normal.     Breath sounds: Normal breath sounds. No wheezing or rales.  Abdominal:     Palpations: Abdomen is soft.  Tenderness: There is no abdominal tenderness.  Skin:    General: Skin is warm.     Findings: No rash.  Neurological:     Mental Status: She is oriented to person, place, and time.     Comments: President---"Biden, Trump, Barack" 100-97 D-l-r-[id number]-w Recall 2/3  Psychiatric:        Mood and Affect: Mood normal.        Behavior: Behavior normal.            Assessment & Plan:

## 2020-11-10 NOTE — Progress Notes (Signed)
Patient received prescribed treatment in clinic. Patient stable at discharge. 

## 2020-11-10 NOTE — Assessment & Plan Note (Signed)
See social history 

## 2020-11-10 NOTE — Progress Notes (Signed)
Hearing Screening   125Hz  250Hz  500Hz  1000Hz  2000Hz  3000Hz  4000Hz  6000Hz  8000Hz   Right ear:   25 25 25  25     Left ear:   25 25 25  25     Vision Screening Comments: July 2021

## 2020-11-10 NOTE — Patient Instructions (Signed)
Please increase the insulin to 15 units daily for 2 days and then up to 20 units daily. We may need to increase it further if your morning sugars are consistently over 140.

## 2020-11-10 NOTE — Assessment & Plan Note (Signed)
Doing well on IVIG, prednisone and MTX Off statin due to this

## 2020-11-11 ENCOUNTER — Inpatient Hospital Stay: Payer: Medicare PPO

## 2020-11-11 VITALS — BP 181/88 | HR 88 | Temp 97.7°F | Resp 18

## 2020-11-11 DIAGNOSIS — M3320 Polymyositis, organ involvement unspecified: Secondary | ICD-10-CM

## 2020-11-11 MED ORDER — DIPHENHYDRAMINE HCL 25 MG PO TABS
25.0000 mg | ORAL_TABLET | Freq: Once | ORAL | Status: AC
  Administered 2020-11-11: 25 mg via ORAL
  Filled 2020-11-11: qty 1

## 2020-11-11 MED ORDER — METHYLPREDNISOLONE SODIUM SUCC 125 MG IJ SOLR
40.0000 mg | INTRAMUSCULAR | Status: DC
  Administered 2020-11-11: 40 mg via INTRAVENOUS
  Filled 2020-11-11 (×2): qty 2

## 2020-11-11 MED ORDER — DEXTROSE 5 % IV SOLN
INTRAVENOUS | Status: DC
  Filled 2020-11-11: qty 250

## 2020-11-11 MED ORDER — IMMUNE GLOBULIN (HUMAN) 20 GM/200ML IV SOLN
40.0000 g | INTRAVENOUS | Status: DC
  Administered 2020-11-11: 40 g via INTRAVENOUS
  Filled 2020-11-11: qty 400

## 2020-11-11 MED ORDER — DIPHENHYDRAMINE HCL 25 MG PO CAPS
ORAL_CAPSULE | ORAL | Status: AC
  Filled 2020-11-11: qty 1

## 2020-11-11 MED ORDER — ACETAMINOPHEN 325 MG PO TABS
650.0000 mg | ORAL_TABLET | Freq: Once | ORAL | Status: AC
  Administered 2020-11-11: 650 mg via ORAL
  Filled 2020-11-11: qty 2

## 2020-11-11 NOTE — Progress Notes (Signed)
Patient received prescribed treatment in clinic. Tolerated well. Patient stable at discharge. 

## 2020-11-16 ENCOUNTER — Telehealth: Payer: Self-pay | Admitting: *Deleted

## 2020-11-16 NOTE — Telephone Encounter (Signed)
Patient called stating that she was a little confused about how she was to increase her insulin after her last visit with Dr. Silvio Pate. Pulled up notes from office visit on 11/10/20 and read patient the instructions. Patient stated that she thought she knew what to do and she was right.

## 2020-12-16 NOTE — Progress Notes (Signed)
[facility]  [address] [place], [place] [address] Phone: [phone]  Fax: [phone]   Clinic Day:  12/20/2020  Referring physician: [name], MD  Chief Complaint: Bridget Romero is a 74 y.o. 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  HPI: The patient was last seen in the hematology clinic on 11/08/2020. At that time, she has felt "pretty good". The back of her left leg was still tight. She was able to get up from a sitting position without using her arms.  Exam was stable. Hematocrit was 32.7, hemoglobin 10.2, MCV 91.3, platelets 292,000, WBC 7,000. LFTs were normal. Aldolase was 5.2 (normal). CK was 304 (38-234). She continued prednisone 10 mg/day and MTX10mg SQweekly.  She received IVIG daily x 4 (11/08/2020 - 11/11/2020).  During the interim, she has been "pretty good." She sometimes has diarrhea in the mornings. Her lower extremity strength is good. The back of her left leg is still tight. Her sense of taste is pretty good. She is on the same doses of methotrexate and prednisone.  Per patient, her recent hemoglobin A1c was 9. Her Metformin was increased. Sometimes her blood sugars are very low (in the 40s) in the mornings.   Past Medical History:  Diagnosis Date  . Allergy   . Asthma   . Diabetes mellitus   . Hyperlipidemia   . Hypertension   . Neuromuscular disorder (HCC)    polymyositis  . Osteoarthritis   . Vitamin B12 deficiency     Past Surgical History:  Procedure Laterality Date  . COLONOSCOPY WITH PROPOFOL N/A 03/07/2016   Procedure: COLONOSCOPY WITH PROPOFOL;  Surgeon: Lollie Sails, MD;  Location: [facility] ENDOSCOPY;  Service: Endoscopy;  Laterality: N/A;    Family History  Problem Relation Age of Onset  . Heart disease Mother   . Cancer Father   . Breast cancer Neg Hx     Social History:  reports that she has quit smoking. She has never used smokeless  tobacco. She reports that she does not drink alcohol and does not use drugs. 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 The patient is alone today.   Allergies:  Allergies  Allergen Reactions  . Atorvastatin Other (See Comments)    myalgias  . Pravastatin Other (See Comments)    Muscle pain    Current Medications: Current Outpatient Medications  Medication Sig Dispense Refill  . ACCU-CHEK FASTCLIX LANCETS MISC Use to test blood sugar twice daily dx: 250.02 100 each 3  . acetaminophen (TYLENOL) 500 MG tablet Take 500 mg by mouth every 6 (six) hours as needed.    . Alcohol Swabs (B-D SINGLE USE SWABS REGULAR) PADS Use to test blood sugar once daily dx: 250.00 100 each 3  . B-D TB SYRINGE 1CC/25GX5/8" 25G X 5/8" 1 ML MISC     . Blood Glucose Calibration (ACCU-CHEK AVIVA) SOLN Use as directed 1 each 3  . Cholecalciferol (VITAMIN D) 1000 UNITS capsule Take 1,000 Units by mouth daily.    . folic acid (FOLVITE) 1 MG tablet Take 1 mg by mouth daily.    Marland Kitchen gabapentin (NEURONTIN) 300 MG capsule Take 300 mg by mouth 3 (three) times daily.     Marland Kitchen glucose blood (ACCU-CHEK SMARTVIEW) test strip Use to check blood sugar twice a day Dx Code E11.4 100 each 6  . insulin glargine (LANTUS) 100 UNIT/ML injection Inject 0.2 mLs (20 Units total) into the skin daily.  1 mL 0  . lisinopril (ZESTRIL) 10 MG tablet Take 1 tablet (10 mg total) by mouth daily. 90 tablet 3  . metFORMIN (GLUCOPHAGE) 1000 MG tablet TAKE 1 TABLET BY MOUTH TWICE A DAY 180 tablet 3  . methotrexate 250 MG/10ML injection Inject into the skin.     . Multiple Vitamin (MULTIVITAMIN) tablet Take 1 tablet by mouth daily.    . predniSONE (DELTASONE) 10 MG tablet Take 10 mg by mouth daily.      No current facility-administered medications for this visit.    Review of Systems  Constitutional: Negative.  Negative for chills, diaphoresis, fever, malaise/fatigue and weight loss (up 6 lbs).       Feels "pretty good".  HENT: Negative.  Negative for  congestion, ear discharge, ear pain, hearing loss, nosebleeds, sinus pain, sore throat and tinnitus.        Sense of taste is "pretty good."  Eyes: Negative.  Negative for blurred vision, double vision and photophobia.  Respiratory: Negative.  Negative for cough, hemoptysis, sputum production and shortness of breath.   Cardiovascular: Negative.  Negative for chest pain, palpitations, orthopnea and leg swelling.  Gastrointestinal: Positive for diarrhea (some mornings). Negative for abdominal pain, blood in stool, constipation, heartburn, melena, nausea and vomiting.  Genitourinary: Negative for dysuria, frequency, hematuria and urgency.  Musculoskeletal: Negative for back pain, joint pain, myalgias and neck pain.       Back of the left leg feels tight.  Skin: Negative.  Negative for itching and rash.  Neurological: Negative.  Negative for dizziness, tingling, sensory change, focal weakness, weakness and headaches.  Endo/Heme/Allergies: Does not bruise/bleed easily.       Diabetes on insulin and Metformin.   Psychiatric/Behavioral: Negative.  Negative for depression and memory loss. The patient is not nervous/anxious and does not have insomnia.   All other systems reviewed and are negative.  Performance status (ECOG): 1  Vitals Blood pressure (!) 150/66, pulse 80, temperature (!) 96.3 F (35.7 C), temperature source Tympanic, resp. rate 16, weight 201 lb 11.5 oz (91.5 kg), SpO2 100 %.   Physical Exam Vitals and nursing note reviewed.  Constitutional:      General: She is not in acute distress.    Appearance: Normal appearance. She is well-developed. She is not ill-appearing or diaphoretic.  HENT:     Head: Normocephalic and atraumatic.     Comments: Short dark hair.    Mouth/Throat:     Pharynx: No oropharyngeal exudate or posterior oropharyngeal erythema.  Eyes:     General: No scleral icterus.    Conjunctiva/sclera: Conjunctivae normal.     Pupils: Pupils are equal, round, and  reactive to light.     Comments: Brown eyes.  Cardiovascular:     Rate and Rhythm: Normal rate and regular rhythm.     Heart sounds: Normal heart sounds. No murmur heard.   Pulmonary:     Effort: Pulmonary effort is normal. No respiratory distress.     Breath sounds: Normal breath sounds. No wheezing or rales.  Chest:     Chest wall: No tenderness.  Abdominal:     General: Bowel sounds are normal. There is no distension.     Palpations: Abdomen is soft. There is no mass.     Tenderness: There is no abdominal tenderness. There is no guarding or rebound.  Musculoskeletal:        General: No swelling or tenderness. Normal range of motion.     Cervical back: Normal range of  motion and neck supple.  Lymphadenopathy:     Cervical: No cervical adenopathy.  Skin:    General: Skin is warm and dry.     Coloration: Skin is not jaundiced or pale.  Neurological:     Mental Status: She is alert and oriented to person, place, and time.     Sensory: No sensory deficit.     Motor: No weakness.     Comments: Lower extremity strength and sensation are symmetric and normal. Trace patellar reflexes.  Psychiatric:        Behavior: Behavior normal.        Thought Content: Thought content normal.        Judgment: Judgment normal.    Appointment on 12/20/2020  Component Date Value Ref Range Status  . Aldolase 12/20/2020 4.6  3.3 - 10.3 U/L Final   Comment: (NOTE) Performed At: [facility] [place], [place] [id number] Rush Farmer MD [id number]   . Total CK 12/20/2020 138  38 - 234 U/L Final   Performed at [facility], [address], [place], [place] [address]  . Sodium 12/20/2020 140  135 - 145 mmol/L Final  . Potassium 12/20/2020 3.5  3.5 - 5.1 mmol/L Final  . Chloride 12/20/2020 105  98 - 111 mmol/L Final  . CO2 12/20/2020 27  22 - 32 mmol/L Final  . Glucose, Bld 12/20/2020 179* 70 - 99 mg/dL Final   Glucose reference range applies only to samples  taken after fasting for at least 8 hours.  . BUN 12/20/2020 17  8 - 23 mg/dL Final  . Creatinine, Ser 12/20/2020 0.79  0.44 - 1.00 mg/dL Final  . Calcium 12/20/2020 9.0  8.9 - 10.3 mg/dL Final  . Total Protein 12/20/2020 6.5  6.5 - 8.1 g/dL Final  . Albumin 12/20/2020 3.4* 3.5 - 5.0 g/dL Final  . AST 12/20/2020 21  15 - 41 U/L Final  . ALT 12/20/2020 16  0 - 44 U/L Final  . Alkaline Phosphatase 12/20/2020 42  38 - 126 U/L Final  . Total Bilirubin 12/20/2020 0.7  0.3 - 1.2 mg/dL Final  . GFR, Estimated 12/20/2020 >60  >60 mL/min Final   Comment: (NOTE) Calculated using the CKD-EPI Creatinine Equation (2021)   . Anion gap 12/20/2020 8  5 - 15 Final   Performed at [facility], [address] [place], [place] [address]  . WBC 12/20/2020 6.9  4.0 - 10.5 K/uL Final  . RBC 12/20/2020 3.44* 3.87 - 5.11 MIL/uL Final  . Hemoglobin 12/20/2020 9.8* 12.0 - 15.0 g/dL Final  . HCT 12/20/2020 31.6* 36.0 - 46.0 % Final  . MCV 12/20/2020 91.9  80.0 - 100.0 fL Final  . MCH 12/20/2020 28.5  26.0 - 34.0 pg Final  . MCHC 12/20/2020 31.0  30.0 - 36.0 g/dL Final  . RDW 12/20/2020 15.0  11.5 - 15.5 % Final  . Platelets 12/20/2020 285  150 - 400 K/uL Final  . nRBC 12/20/2020 0.0  0.0 - 0.2 % Final  . Neutrophils Relative % 12/20/2020 65  % Final  . Neutro Abs 12/20/2020 4.5  1.7 - 7.7 K/uL Final  . Lymphocytes Relative 12/20/2020 27  % Final  . Lymphs Abs 12/20/2020 1.9  0.7 - 4.0 K/uL Final  . Monocytes Relative 12/20/2020 6  % Final  . Monocytes Absolute 12/20/2020 0.4  0.1 - 1.0 K/uL Final  . Eosinophils Relative 12/20/2020 1  % Final  . Eosinophils Absolute 12/20/2020 0.1  0.0 - 0.5 K/uL Final  . Basophils Relative 12/20/2020 0  % Final  . Basophils Absolute 12/20/2020 0.0  0.0 - 0.1 K/uL Final  . Immature Granulocytes 12/20/2020 1  % Final  . Abs Immature Granulocytes 12/20/2020 0.04  0.00 - 0.07 K/uL Final   Performed at [facility], [address], [place], [place]  [address]    Assessment:  Bridget Romero is a 73 y.o. 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 She receives IVIG monthly,methotrexate weekly, and prednisone5mg  a day.  CKhas been followed (38-234): >5555 on 05/30/2017, 4409 on 06/20/2017, 3546 on 07/05/2017, 2725 on 07/30/2017, 3675 on 08/28/2017, 2405 on 10/08/2017, 2743 on 11/08/2017, 2723 on 12/20/2017, 723 on 01/21/2018, 507 on 02/21/2018, 2192 on 04/11/2018, 2227 on 06/04/2018, 4468 on 07/22/2018, 2204 on 09/06/2018, 1360 on 12/02/2018, 1423 on 01/01/2019,912 on 03/03/2019, 3955 on 04/21/2019,4612 on 05/06/2019,4560 on 05/20/2019, 4532 on 06/16/2019,4935 on 07/10/2019,4330 on 07/28/2019, 3645 on 08/13/2019, 2514 on 08/25/2019, 807 on 09/22/2019, 251 on 10/21/2019, 67 on 11/25/2019, 66 on 12/22/2019, < 5 on 01/19/2020, 67 on 02/02/2020, 254 on 03/15/2020, 338 on 03/29/2020, 847 on 05/25/2020, 854 on 07/05/2020, 702 on 08/16/2020, 811 on 09/27/2020, 304 on 11/08/2020, and 138 on 12/20/2020.  Aldolasehas been followed (3.3-10.3): 67.2 in06/05/2017, 69.9 on06/27/2018, 64.8 on07/11/2017,57.6 on08/05/2017, 60.3 on09/03/2017, 38.7 on 12/20/2017, 9.3 on01/28/2019, 7.4 on02/28/2019, 29.1 on04/18/2019, to51.9 on07/29/2019, 23.7 on09/13/2019, 16.4 on 12/02/2018,14.1 on 01/01/2019,8.8 on 03/03/2019, 53.4 on 05/06/2019,42.9 on 05/20/2019, 39.0 in 06/16/2019, 69.3 on 07/28/2019, 37.2 on 08/25/2019, 12.5 on 09/22/2019, 7.2 on 10/21/2019, 5.2 on 11/25/2019, 4.8 on 12/22/2019, 3.9 on 01/19/2020, 4.2 on 02/02/2020, 5.5 on 03/15/2020, 5.6 on 03/29/2020, 9.1 on 05/25/2020, 11.6 on 07/05/2020, 11.4 on 08/16/2020, 10.8 on 09/27/2020, 5.2 on 11/08/2020, and 4.6 on 12/20/2020.  She received IVIG (Privigen) 75 gm x 2 days every 4 weeks beginning 12/07/2017.IVIG was decreased to every 8 weeks on 04/22/2019, butthen returned to monthly secondary to rise CK.IVIG wasincreased to 2 gm/kg on08/31/2020,09/23/2019, 10/21/2019, 11/25/2019, 02/02/2020, 03/29/2020,  05/25/2020, 07/05/2020, 08/16/2020, 09/27/2020, and 11/08/2020.  She was switched to every 8 weeks then every 6 weeks.  She has a normocytic anemia. Baseline hemoglobin is 10. Anemia work-upon 12/22/2019 revealed a hematocritwas32.4, hemoglobin 10.0, MCV 92.8, platelets 258,000, WBC 6200, ANC 3700. Reticwas 3.8%.[id number].Coombs was negative. Peripheralsmearrevealed anormocytic anemia. The morphologyof the RBCs, WBCs and platelets were within normal limits.  Last colonoscopy on 03/07/2016 revealed grade I hemorrhoids.She has B12 deficiencyand is on oral B12. B12 was 306 and folate >22.3 on 05/30/2017.   She has chronicmild elevation in LFTswhich have correlated with increased CPK.AST/ALThave been followed:53/54 on 02/17/2019, 107/128 on04/27/2020, 134/200 on05/26/2020,138/261 on 07/10/2019,151/304 on08/02/2019, 175/289 on 08/13/2019, 102/196 on 08/25/2019, 47/75 on 09/22/2019, 12/23 on 11/25/2019, 19/16 on 12/22/2019, 20/16 on 01/19/2020, 19/15 on 02/02/2020, 22/17 on 03/15/2020, 25/19 on 03/29/2020, 40/36 on 05/25/2020, 42/46 on 07/05/2020, 37/48 on 08/16/2020, 36/44 on 09/27/2020, 24/23 on 11/08/2020, and 21/16 on 12/20/2020. Hepatitis B and C testing werenegative on 06/27/2018and 07/28/2019.  Symptomatically, she feels "pretty good." Her lower extremity strength is good. The back of her left leg is still tight.  Exam is stable.  Plan: 1.   Labs today: CBC with diff, CMP, CK, aldolase. 2.Polymyositis Clinically, she continues to do well on IVIG 2 g/kg every 6 weeks. She remains on prednisone 10 mg/day and MTX10mg SQweekly. CK is 138 (normal).  Aldolase is 4.6 (normal). Continue IVIG every 6 weeks. Patient is scheduled to follow-up with Dr. [name].  Labs reviewed. Begin IVIG x4 days beginning today. 3.Elevated LFTs, resolved AST 21and  ALT 16.  Increase in LFTs  correlates with increased CPK (now normal). CPK reflects activity of polymyositis. Monitor LFTs with each cycle. 4.Normocytic anemia She has a mild normocytic anemia.  Hematocrit 32.7.  Hemoglobin 10.2.  MCV 91.3 on 11/08/2020.  Hematocrit 31.6.  Hemoglobin   9.8.  MCV 91.9 on 12/20/2020 Ferritin205 with an iron sat 40% and a TIBC 278 on 11/25/2019. B12was 716 andfolate35.0. Reticwas 3.8%. Coombsnegative [id number]. Continue to monitor. 5.   Begin IVIG over the next 4 days. 6.   RTC in 6 weeks for MD assessment, labs (CBC with diff, CMP, CK, aldolase) and IVIG over 4 days.  I discussed the assessment and treatment plan with the patient.  The patient was provided an opportunity to ask questions and all were answered.  The patient agreed with the plan and demonstrated an understanding of the instructions.  The patient was advised to call back if the symptoms worsen or if the condition fails to improve as anticipated.   Lequita Asal, MD, PhD    12/20/2020, 12:37 PM  I, Mirian Mo Tufford, am acting as a Education administrator for [employer]. Mike Gip, MD.   I, Ezio Wieck C. Mike Gip, MD, have reviewed the above documentation for accuracy and completeness, and I agree with the above.

## 2020-12-20 ENCOUNTER — Inpatient Hospital Stay: Payer: Medicare PPO | Attending: Hematology and Oncology | Admitting: Hematology and Oncology

## 2020-12-20 ENCOUNTER — Inpatient Hospital Stay: Payer: Medicare PPO

## 2020-12-20 ENCOUNTER — Encounter: Payer: Self-pay | Admitting: Hematology and Oncology

## 2020-12-20 ENCOUNTER — Other Ambulatory Visit: Payer: Self-pay

## 2020-12-20 VITALS — BP 183/80 | HR 77

## 2020-12-20 VITALS — BP 150/66 | HR 80 | Temp 96.3°F | Resp 16 | Wt 201.7 lb

## 2020-12-20 DIAGNOSIS — M3320 Polymyositis, organ involvement unspecified: Secondary | ICD-10-CM

## 2020-12-20 DIAGNOSIS — Z79899 Other long term (current) drug therapy: Secondary | ICD-10-CM | POA: Insufficient documentation

## 2020-12-20 DIAGNOSIS — D649 Anemia, unspecified: Secondary | ICD-10-CM | POA: Diagnosis not present

## 2020-12-20 LAB — CBC WITH DIFFERENTIAL/PLATELET
Abs Immature Granulocytes: 0.04 10*3/uL (ref 0.00–0.07)
Basophils Absolute: 0 10*3/uL (ref 0.0–0.1)
Basophils Relative: 0 %
Eosinophils Absolute: 0.1 10*3/uL (ref 0.0–0.5)
Eosinophils Relative: 1 %
HCT: 31.6 % — ABNORMAL LOW (ref 36.0–46.0)
Hemoglobin: 9.8 g/dL — ABNORMAL LOW (ref 12.0–15.0)
Immature Granulocytes: 1 %
Lymphocytes Relative: 27 %
Lymphs Abs: 1.9 10*3/uL (ref 0.7–4.0)
MCH: 28.5 pg (ref 26.0–34.0)
MCHC: 31 g/dL (ref 30.0–36.0)
MCV: 91.9 fL (ref 80.0–100.0)
Monocytes Absolute: 0.4 10*3/uL (ref 0.1–1.0)
Monocytes Relative: 6 %
Neutro Abs: 4.5 10*3/uL (ref 1.7–7.7)
Neutrophils Relative %: 65 %
Platelets: 285 10*3/uL (ref 150–400)
RBC: 3.44 MIL/uL — ABNORMAL LOW (ref 3.87–5.11)
RDW: 15 % (ref 11.5–15.5)
WBC: 6.9 10*3/uL (ref 4.0–10.5)
nRBC: 0 % (ref 0.0–0.2)

## 2020-12-20 LAB — COMPREHENSIVE METABOLIC PANEL
ALT: 16 U/L (ref 0–44)
AST: 21 U/L (ref 15–41)
Albumin: 3.4 g/dL — ABNORMAL LOW (ref 3.5–5.0)
Alkaline Phosphatase: 42 U/L (ref 38–126)
Anion gap: 8 (ref 5–15)
BUN: 17 mg/dL (ref 8–23)
CO2: 27 mmol/L (ref 22–32)
Calcium: 9 mg/dL (ref 8.9–10.3)
Chloride: 105 mmol/L (ref 98–111)
Creatinine, Ser: 0.79 mg/dL (ref 0.44–1.00)
GFR, Estimated: >60 mL/min (ref >60)
Glucose, Bld: 179 mg/dL — ABNORMAL HIGH (ref 70–99)
Potassium: 3.5 mmol/L (ref 3.5–5.1)
Sodium: 140 mmol/L (ref 135–145)
Total Bilirubin: 0.7 mg/dL (ref 0.3–1.2)
Total Protein: 6.5 g/dL (ref 6.5–8.1)

## 2020-12-20 LAB — CK: Total CK: 138 U/L (ref 38–234)

## 2020-12-20 MED ORDER — ACETAMINOPHEN 325 MG PO TABS
650.0000 mg | ORAL_TABLET | Freq: Once | ORAL | Status: AC
  Administered 2020-12-20: 10:00:00 650 mg via ORAL
  Filled 2020-12-20: qty 2

## 2020-12-20 MED ORDER — DIPHENHYDRAMINE HCL 25 MG PO TABS
25.0000 mg | ORAL_TABLET | Freq: Once | ORAL | Status: AC
  Administered 2020-12-20: 10:00:00 25 mg via ORAL
  Filled 2020-12-20: qty 1

## 2020-12-20 MED ORDER — METHYLPREDNISOLONE SODIUM SUCC 125 MG IJ SOLR
40.0000 mg | INTRAMUSCULAR | Status: DC
  Administered 2020-12-20: 10:00:00 40 mg via INTRAVENOUS
  Filled 2020-12-20: qty 2

## 2020-12-20 MED ORDER — DEXTROSE 5 % IV SOLN
INTRAVENOUS | Status: DC
  Filled 2020-12-20: qty 250

## 2020-12-20 MED ORDER — IMMUNE GLOBULIN (HUMAN) 20 GM/200ML IV SOLN
40.0000 g | INTRAVENOUS | Status: DC
  Administered 2020-12-20: 10:00:00 40 g via INTRAVENOUS
  Filled 2020-12-20: qty 400

## 2020-12-20 NOTE — Progress Notes (Signed)
Patient received prescribed treatment in clinic. Tolerated well. Patient stable at discharge. 

## 2020-12-21 ENCOUNTER — Inpatient Hospital Stay: Payer: Medicare PPO

## 2020-12-21 VITALS — BP 158/80 | HR 84 | Temp 96.0°F | Resp 16 | Wt 199.8 lb

## 2020-12-21 DIAGNOSIS — M3320 Polymyositis, organ involvement unspecified: Secondary | ICD-10-CM | POA: Diagnosis not present

## 2020-12-21 DIAGNOSIS — Z79899 Other long term (current) drug therapy: Secondary | ICD-10-CM | POA: Diagnosis not present

## 2020-12-21 LAB — ALDOLASE: Aldolase: 4.6 U/L (ref 3.3–10.3)

## 2020-12-21 MED ORDER — DIPHENHYDRAMINE HCL 25 MG PO TABS
25.0000 mg | ORAL_TABLET | Freq: Once | ORAL | Status: AC
  Administered 2020-12-21: 08:00:00 25 mg via ORAL
  Filled 2020-12-21: qty 1

## 2020-12-21 MED ORDER — ACETAMINOPHEN 325 MG PO TABS
650.0000 mg | ORAL_TABLET | Freq: Once | ORAL | Status: AC
  Administered 2020-12-21: 08:00:00 650 mg via ORAL
  Filled 2020-12-21: qty 2

## 2020-12-21 MED ORDER — IMMUNE GLOBULIN (HUMAN) 20 GM/200ML IV SOLN
40.0000 g | INTRAVENOUS | Status: DC
  Administered 2020-12-21: 09:00:00 40 g via INTRAVENOUS
  Filled 2020-12-21: qty 400

## 2020-12-21 MED ORDER — DEXTROSE 5 % IV SOLN
INTRAVENOUS | Status: DC
  Filled 2020-12-21: qty 250

## 2020-12-21 MED ORDER — METHYLPREDNISOLONE SODIUM SUCC 125 MG IJ SOLR
40.0000 mg | INTRAMUSCULAR | Status: DC
  Administered 2020-12-21: 08:00:00 40 mg via INTRAVENOUS
  Filled 2020-12-21: qty 2

## 2020-12-21 MED ORDER — DIPHENHYDRAMINE HCL 25 MG PO CAPS
ORAL_CAPSULE | ORAL | Status: AC
  Filled 2020-12-21: qty 1

## 2020-12-21 NOTE — Progress Notes (Signed)
Patient received prescribed treatment in clinic. Tolerated well. Patient stable at discharge. 

## 2020-12-22 ENCOUNTER — Other Ambulatory Visit: Payer: Self-pay

## 2020-12-22 ENCOUNTER — Inpatient Hospital Stay: Payer: Medicare PPO

## 2020-12-22 VITALS — BP 168/88 | HR 67 | Temp 96.1°F | Resp 18

## 2020-12-22 DIAGNOSIS — M3320 Polymyositis, organ involvement unspecified: Secondary | ICD-10-CM

## 2020-12-22 DIAGNOSIS — Z79899 Other long term (current) drug therapy: Secondary | ICD-10-CM | POA: Diagnosis not present

## 2020-12-22 MED ORDER — METHYLPREDNISOLONE SODIUM SUCC 125 MG IJ SOLR
40.0000 mg | INTRAMUSCULAR | Status: DC
  Administered 2020-12-22: 09:00:00 40 mg via INTRAVENOUS
  Filled 2020-12-22: qty 2

## 2020-12-22 MED ORDER — DIPHENHYDRAMINE HCL 25 MG PO TABS
25.0000 mg | ORAL_TABLET | Freq: Once | ORAL | Status: AC
  Administered 2020-12-22: 09:00:00 25 mg via ORAL
  Filled 2020-12-22: qty 1

## 2020-12-22 MED ORDER — IMMUNE GLOBULIN (HUMAN) 20 GM/200ML IV SOLN
40.0000 g | INTRAVENOUS | Status: DC
  Administered 2020-12-22: 10:00:00 40 g via INTRAVENOUS
  Filled 2020-12-22: qty 400

## 2020-12-22 MED ORDER — ACETAMINOPHEN 325 MG PO TABS
650.0000 mg | ORAL_TABLET | Freq: Once | ORAL | Status: AC
Start: 2020-12-22 — End: 2020-12-22
  Administered 2020-12-22: 09:00:00 650 mg via ORAL
  Filled 2020-12-22: qty 2

## 2020-12-22 MED ORDER — DIPHENHYDRAMINE HCL 25 MG PO CAPS
ORAL_CAPSULE | ORAL | Status: AC
  Filled 2020-12-22: qty 1

## 2020-12-22 MED ORDER — DEXTROSE 5 % IV SOLN
Freq: Once | INTRAVENOUS | Status: AC
  Filled 2020-12-22: qty 250

## 2020-12-22 NOTE — Progress Notes (Signed)
Pt received IVIG infusion today in clinic. Tolerated well.

## 2020-12-23 ENCOUNTER — Inpatient Hospital Stay: Payer: Medicare PPO

## 2020-12-23 VITALS — BP 156/83 | HR 72 | Temp 96.0°F | Resp 16

## 2020-12-23 DIAGNOSIS — M3320 Polymyositis, organ involvement unspecified: Secondary | ICD-10-CM

## 2020-12-23 DIAGNOSIS — Z79899 Other long term (current) drug therapy: Secondary | ICD-10-CM | POA: Diagnosis not present

## 2020-12-23 MED ORDER — DIPHENHYDRAMINE HCL 25 MG PO TABS
25.0000 mg | ORAL_TABLET | Freq: Once | ORAL | Status: AC
  Administered 2020-12-23: 08:00:00 25 mg via ORAL
  Filled 2020-12-23: qty 1

## 2020-12-23 MED ORDER — ACETAMINOPHEN 325 MG PO TABS
650.0000 mg | ORAL_TABLET | Freq: Once | ORAL | Status: AC
  Administered 2020-12-23: 08:00:00 650 mg via ORAL

## 2020-12-23 MED ORDER — METHYLPREDNISOLONE SODIUM SUCC 125 MG IJ SOLR
40.0000 mg | Freq: Once | INTRAMUSCULAR | Status: AC
  Administered 2020-12-23: 08:00:00 40 mg via INTRAVENOUS

## 2020-12-23 MED ORDER — IMMUNE GLOBULIN (HUMAN) 20 GM/200ML IV SOLN
40.0000 g | INTRAVENOUS | Status: DC
  Administered 2020-12-23: 09:00:00 40 g via INTRAVENOUS
  Filled 2020-12-23: qty 400

## 2020-12-23 MED ORDER — DEXTROSE 5 % IV SOLN
Freq: Once | INTRAVENOUS | Status: AC
  Filled 2020-12-23: qty 250

## 2020-12-23 NOTE — Progress Notes (Signed)
Tolerated treatment well today. No complaints at time of discharge. Ambulatory.

## 2020-12-30 DIAGNOSIS — R21 Rash and other nonspecific skin eruption: Secondary | ICD-10-CM | POA: Diagnosis not present

## 2020-12-30 DIAGNOSIS — E119 Type 2 diabetes mellitus without complications: Secondary | ICD-10-CM | POA: Diagnosis not present

## 2020-12-30 DIAGNOSIS — G629 Polyneuropathy, unspecified: Secondary | ICD-10-CM | POA: Diagnosis not present

## 2020-12-30 DIAGNOSIS — M3322 Polymyositis with myopathy: Secondary | ICD-10-CM | POA: Diagnosis not present

## 2021-01-13 ENCOUNTER — Telehealth: Payer: Self-pay

## 2021-01-13 NOTE — Telephone Encounter (Signed)
This sounds more like a typical cold or COVID---not the flu. In any case, antibiotics are not indicated for that. Would just recommend continued supportive care. Could do virtual visit tomorrow or Saturday if she worsens (instead of slow improvement which I would expect now)

## 2021-01-13 NOTE — Telephone Encounter (Signed)
Spoke to pt. She will check with her children to see if they have video capabilities and schedule a virtual visit. If not she may go to a UC.

## 2021-01-13 NOTE — Telephone Encounter (Signed)
Pt left a message on triage line stating she thought she may have the flu. Called her back. She said she is having watery eyes, runny nose, cough, started a week ago. Has tried Dayquil. No fever. Asked if she could get an antibiotic called in to [facility].

## 2021-01-16 DIAGNOSIS — Z20822 Contact with and (suspected) exposure to covid-19: Secondary | ICD-10-CM | POA: Diagnosis not present

## 2021-01-19 ENCOUNTER — Other Ambulatory Visit: Payer: Self-pay

## 2021-01-19 ENCOUNTER — Other Ambulatory Visit: Payer: Self-pay | Admitting: Internal Medicine

## 2021-01-19 MED ORDER — ACCU-CHEK FASTCLIX LANCETS MISC: 6 refills | Status: DC

## 2021-01-19 NOTE — Telephone Encounter (Signed)
Pt request refill accu chek lancets to [place]. Pt had medicare wellness on 11/10/20. Pt said she has been getting lancets from a different source but wants to start getting from [facility]. Refill done per protocol and pt voiced understanding.

## 2021-01-27 NOTE — Progress Notes (Signed)
[facility]  [address] [place], [place] [address] Phone: [phone]  Fax: [phone]   Clinic Day:  01/31/2021  Referring physician: [name], MD  Chief Complaint: Bridget Romero is a 75 y.o. 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  HPI: The patient was last seen in the hematology clinic on 12/20/2020. At that time, she felt "pretty good." Her lower extremity strength was good. The back of her left leg was still tight.  Exam was stable. Hematocrit was 31.6, hemoglobin 9.8, MCV 91.9, platelets 285,000, WBC 6,900. LFTs were normal.  Aldolase was 4.6. CK was 138. She received IVIG x 4 days.  The patient saw Dr. [name] on 12/30/2020. She was doing well.  Prednisone was decreased to 5 mg a day.  She continued methotrexate 10 mg SQ weekly.  During the interim, she has felt "pretty good".  The strength in her legs is good.  She still notes a little bit of tightness her legs.  She denies any fevers or infections.  Diabetes is under good control.   Past Medical History:  Diagnosis Date  . Allergy   . Asthma   . Diabetes mellitus   . Hyperlipidemia   . Hypertension   . Neuromuscular disorder (HCC)    polymyositis  . Osteoarthritis   . Vitamin B12 deficiency     Past Surgical History:  Procedure Laterality Date  . COLONOSCOPY WITH PROPOFOL N/A 03/07/2016   Procedure: COLONOSCOPY WITH PROPOFOL;  Surgeon: Lollie Sails, MD;  Location: [facility] ENDOSCOPY;  Service: Endoscopy;  Laterality: N/A;    Family History  Problem Relation Age of Onset  . Heart disease Mother   . Cancer Father   . Breast cancer Neg Hx     Social History:  reports that she has quit smoking. She has never used smokeless tobacco. She reports that she does not drink alcohol and does not use drugs. She is a Training and development officer. She lives in [place]. The patient is alone today.   Allergies:  Allergies  Allergen Reactions  .  Atorvastatin Other (See Comments)    myalgias  . Pravastatin Other (See Comments)    Muscle pain    Current Medications: Current Outpatient Medications  Medication Sig Dispense Refill  . Accu-Chek FastClix Lancets MISC USE TO TEST BLOOD SUGAR TWICE DAILY DX E11.40 102 each 11  . acetaminophen (TYLENOL) 500 MG tablet Take 500 mg by mouth every 6 (six) hours as needed.    . Alcohol Swabs (B-D SINGLE USE SWABS REGULAR) PADS Use to test blood sugar once daily dx: 250.00 100 each 3  . B-D TB SYRINGE 1CC/25GX5/8" 25G X 5/8" 1 ML MISC     . Blood Glucose Calibration (ACCU-CHEK AVIVA) SOLN Use as directed 1 each 3  . Cholecalciferol (VITAMIN D) 1000 UNITS capsule Take 1,000 Units by mouth daily.    . folic acid (FOLVITE) 1 MG tablet Take 1 mg by mouth daily.    Marland Kitchen gabapentin (NEURONTIN) 300 MG capsule Take 300 mg by mouth 2 (two) times daily.    Marland Kitchen glucose blood (ACCU-CHEK SMARTVIEW) test strip Use to check blood sugar twice a day Dx Code E11.4 100 each 6  . insulin glargine (LANTUS) 100 UNIT/ML injection Inject 0.2 mLs (20 Units total) into the skin daily. 1 mL 0  . lisinopril (ZESTRIL) 10 MG tablet Take 1 tablet (10 mg total) by mouth daily. 90 tablet 3  . metFORMIN (GLUCOPHAGE)  1000 MG tablet TAKE 1 TABLET BY MOUTH TWICE A DAY 180 tablet 3  . methotrexate 250 MG/10ML injection Inject into the skin.     . Multiple Vitamin (MULTIVITAMIN) tablet Take 1 tablet by mouth daily.    . predniSONE (DELTASONE) 10 MG tablet Take 5 mg by mouth daily.     No current facility-administered medications for this visit.    Review of Systems  Constitutional: Positive for weight loss (1 pound). Negative for chills, diaphoresis, fever and malaise/fatigue.       Feels "pretty good".  HENT: Negative.  Negative for congestion, ear discharge, ear pain, hearing loss, nosebleeds, sinus pain, sore throat and tinnitus.   Eyes: Negative.  Negative for blurred vision, double vision and photophobia.  Respiratory: Negative.   Negative for cough, hemoptysis, sputum production and shortness of breath.   Cardiovascular: Negative.  Negative for chest pain, palpitations, orthopnea and leg swelling.  Gastrointestinal: Negative for abdominal pain, blood in stool, constipation, diarrhea, heartburn, melena, nausea and vomiting.  Genitourinary: Negative for dysuria, frequency, hematuria and urgency.  Musculoskeletal: Negative for back pain, joint pain, myalgias and neck pain.       Back of the left leg feels tight.  Skin: Negative.  Negative for itching and rash.  Neurological: Negative.  Negative for dizziness, tingling, sensory change, focal weakness, weakness and headaches.  Endo/Heme/Allergies: Does not bruise/bleed easily.       Diabetes on insulin and Metformin.   Psychiatric/Behavioral: Negative.  Negative for depression and memory loss. The patient is not nervous/anxious and does not have insomnia.   All other systems reviewed and are negative.  Performance status (ECOG): 1  Vitals Blood pressure (!) 144/81, pulse 78, temperature (!) 96.3 F (35.7 C), temperature source Tympanic, resp. rate 16, weight 200 lb 11.7 oz (91 kg), SpO2 100 %.   Physical Exam Vitals and nursing note reviewed.  Constitutional:      General: She is not in acute distress.    Appearance: Normal appearance. She is well-developed. She is not ill-appearing or diaphoretic.  HENT:     Head: Normocephalic and atraumatic.     Comments: Short dark hair.    Mouth/Throat:     Pharynx: No oropharyngeal exudate or posterior oropharyngeal erythema.  Eyes:     General: No scleral icterus.    Conjunctiva/sclera: Conjunctivae normal.     Pupils: Pupils are equal, round, and reactive to light.     Comments: Brown eyes.  Cardiovascular:     Rate and Rhythm: Normal rate and regular rhythm.     Heart sounds: Normal heart sounds. No murmur heard.   Pulmonary:     Effort: Pulmonary effort is normal. No respiratory distress.     Breath sounds:  Normal breath sounds. No wheezing or rales.  Chest:     Chest wall: No tenderness.  Abdominal:     General: Bowel sounds are normal. There is no distension.     Palpations: Abdomen is soft. There is no mass.     Tenderness: There is no abdominal tenderness. There is no guarding or rebound.  Musculoskeletal:        General: No swelling or tenderness. Normal range of motion.     Cervical back: Normal range of motion and neck supple.  Lymphadenopathy:     Cervical: No cervical adenopathy.  Skin:    General: Skin is warm and dry.     Coloration: Skin is not jaundiced or pale.  Neurological:     Mental Status:  She is alert and oriented to person, place, and time.     Sensory: No sensory deficit.     Motor: No weakness.     Comments: Lower extremity strength and sensation are symmetric and normal. Trace patellar reflexes.  Psychiatric:        Behavior: Behavior normal.        Thought Content: Thought content normal.        Judgment: Judgment normal.    Appointment on 01/31/2021  Component Date Value Ref Range Status  . Aldolase 01/31/2021 4.1  3.3 - 10.3 U/L Final   Comment: (NOTE) Performed At: [facility] [place], [place] [id number] Rush Farmer MD [id number]   . Total CK 01/31/2021 110  38 - 234 U/L Final   Performed at [facility], [address] [place], [place] [address]  . WBC 01/31/2021 5.6  4.0 - 10.5 K/uL Final  . RBC 01/31/2021 3.50* 3.87 - 5.11 MIL/uL Final  . Hemoglobin 01/31/2021 10.1* 12.0 - 15.0 g/dL Final  . HCT 01/31/2021 32.4* 36.0 - 46.0 % Final  . MCV 01/31/2021 92.6  80.0 - 100.0 fL Final  . MCH 01/31/2021 28.9  26.0 - 34.0 pg Final  . MCHC 01/31/2021 31.2  30.0 - 36.0 g/dL Final  . RDW 01/31/2021 14.6  11.5 - 15.5 % Final  . Platelets 01/31/2021 302  150 - 400 K/uL Final  . nRBC 01/31/2021 0.0  0.0 - 0.2 % Final  . Neutrophils Relative % 01/31/2021 64  % Final  . Neutro Abs 01/31/2021 3.6  1.7 - 7.7 K/uL Final   . Lymphocytes Relative 01/31/2021 26  % Final  . Lymphs Abs 01/31/2021 1.4  0.7 - 4.0 K/uL Final  . Monocytes Relative 01/31/2021 7  % Final  . Monocytes Absolute 01/31/2021 0.4  0.1 - 1.0 K/uL Final  . Eosinophils Relative 01/31/2021 2  % Final  . Eosinophils Absolute 01/31/2021 0.1  0.0 - 0.5 K/uL Final  . Basophils Relative 01/31/2021 1  % Final  . Basophils Absolute 01/31/2021 0.0  0.0 - 0.1 K/uL Final  . Immature Granulocytes 01/31/2021 0  % Final  . Abs Immature Granulocytes 01/31/2021 0.02  0.00 - 0.07 K/uL Final   Performed at [facility], [address], [place], [place] [address]  . Sodium 01/31/2021 140  135 - 145 mmol/L Final  . Potassium 01/31/2021 3.6  3.5 - 5.1 mmol/L Final  . Chloride 01/31/2021 102  98 - 111 mmol/L Final  . CO2 01/31/2021 25  22 - 32 mmol/L Final  . Glucose, Bld 01/31/2021 142* 70 - 99 mg/dL Final   Glucose reference range applies only to samples taken after fasting for at least 8 hours.  . BUN 01/31/2021 18  8 - 23 mg/dL Final  . Creatinine, Ser 01/31/2021 0.74  0.44 - 1.00 mg/dL Final  . Calcium 01/31/2021 9.7  8.9 - 10.3 mg/dL Final  . Total Protein 01/31/2021 7.1  6.5 - 8.1 g/dL Final  . Albumin 01/31/2021 3.6  3.5 - 5.0 g/dL Final  . AST 01/31/2021 19  15 - 41 U/L Final  . ALT 01/31/2021 15  0 - 44 U/L Final  . Alkaline Phosphatase 01/31/2021 43  38 - 126 U/L Final  . Total Bilirubin 01/31/2021 0.6  0.3 - 1.2 mg/dL Final  . GFR, Estimated 01/31/2021 >60  >60 mL/min Final   Comment: (NOTE) Calculated using the CKD-EPI Creatinine Equation (2021)   . Anion gap 01/31/2021  13  5 - 15 Final   Performed at [facility], [address], [place], [place] [address]    Assessment:  CHEYEANNE ROADCAP is a 75 y.o. 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 She receives IVIG monthly,methotrexate weekly, and prednisone5mg  a day.  CKhas been followed (38-234): >5555 on 05/30/2017, 4409 on 06/20/2017, 3546 on 07/05/2017, 2725 on 07/30/2017,  3675 on 08/28/2017, 2405 on 10/08/2017, 2743 on 11/08/2017, 2723 on 12/20/2017, 723 on 01/21/2018, 507 on 02/21/2018, 2192 on 04/11/2018, 2227 on 06/04/2018, 4468 on 07/22/2018, 2204 on 09/06/2018, 1360 on 12/02/2018, 1423 on 01/01/2019,912 on 03/03/2019, 3955 on 04/21/2019,4612 on 05/06/2019,4560 on 05/20/2019, 4532 on 06/16/2019,4935 on 07/10/2019,4330 on 07/28/2019, 3645 on 08/13/2019, 2514 on 08/25/2019, 807 on 09/22/2019, 251 on 10/21/2019, 67 on 11/25/2019, 66 on 12/22/2019, < 5 on 01/19/2020, 67 on 02/02/2020, 254 on 03/15/2020, 338 on 03/29/2020, 847 on 05/25/2020, 854 on 07/05/2020, 702 on 08/16/2020, 811 on 09/27/2020, 304 on 11/08/2020, 138 on 12/20/2020, and 110 on 01/31/2021.  Aldolasehas been followed (3.3-10.3): 67.2 in06/05/2017, 69.9 on06/27/2018, 64.8 on07/11/2017,57.6 on08/05/2017, 60.3 on09/03/2017, 38.7 on 12/20/2017, 9.3 on01/28/2019, 7.4 on02/28/2019, 29.1 on04/18/2019, to51.9 on07/29/2019, 23.7 on09/13/2019, 16.4 on 12/02/2018,14.1 on 01/01/2019,8.8 on 03/03/2019, 53.4 on 05/06/2019,42.9 on 05/20/2019, 39.0 in 06/16/2019, 69.3 on 07/28/2019, 37.2 on 08/25/2019, 12.5 on 09/22/2019, 7.2 on 10/21/2019, 5.2 on 11/25/2019, 4.8 on 12/22/2019, 3.9 on 01/19/2020, 4.2 on 02/02/2020, 5.5 on 03/15/2020, 5.6 on 03/29/2020, 9.1 on 05/25/2020, 11.6 on 07/05/2020, 11.4 on 08/16/2020, 10.8 on 09/27/2020, 5.2 on 11/08/2020, 4.6 on 12/20/2020, and 4.1 on 01/31/2021.  She received IVIG (Privigen) 75 gm x 2 days every 4 weeks beginning 12/07/2017.IVIG was decreased to every 8 weeks on 04/22/2019, butthen returned to monthly secondary to rise CK.IVIG wasincreased to 2 gm/kg on08/31/2020,09/23/2019, 10/21/2019, 11/25/2019, 02/02/2020, 03/29/2020, 05/25/2020, 07/05/2020, 08/16/2020, 09/27/2020, 11/08/2020, and 12/20/2020.  She was switched to every 8 weeks then every 6 weeks.  She has a normocytic anemia. Baseline hemoglobin is 10. Anemia work-upon 12/22/2019 revealed a  hematocritwas32.4, hemoglobin 10.0, MCV 92.8, platelets 258,000, WBC 6200, ANC 3700. Reticwas 3.8%.[id number].Coombs was negative. Peripheralsmearrevealed anormocytic anemia. The morphologyof the RBCs, WBCs and platelets were within normal limits.  Last colonoscopy on 03/07/2016 revealed grade I hemorrhoids.She has B12 deficiencyand is on oral B12. B12 was 306 and folate >22.3 on 05/30/2017.   She has chronicmild elevation in LFTswhich have correlated with increased CPK.AST/ALThave been followed:53/54 on 02/17/2019, 107/128 on04/27/2020, 134/200 on05/26/2020,138/261 on 07/10/2019,151/304 on08/02/2019, 175/289 on 08/13/2019, 102/196 on 08/25/2019, 47/75 on 09/22/2019, 12/23 on 11/25/2019, 19/16 on 12/22/2019, 20/16 on 01/19/2020, 19/15 on 02/02/2020, 22/17 on 03/15/2020, 25/19 on 03/29/2020, 40/36 on 05/25/2020, 42/46 on 07/05/2020, 37/48 on 08/16/2020, 36/44 on 09/27/2020, 24/23 on 11/08/2020, and 21/16 on 12/20/2020. Hepatitis B and C testing werenegative on 06/27/2018and 07/28/2019.  Symptomatically, she is doing well.  Exam is stable.  LFTs, CK and aldolase are normal.  Plan: 1.   Labs today: CBC with diff, CMP, CK, aldolase. 2.Polymyositis Clinically, she continues to do well with IVIG 2 g/kg every 6 weeks. She is on prednisone 5 mg a day and MTX10mg SQweekly. CK is 110 (normal).  Aldolase is 4.1 (normal). Continue IVIG every 6 weeks.  Labs reviewed.  Begin IVIG x4 days beginning today.  Contact Dr. [name] regarding daily steroids with treatment. 3.Elevated LFTs, resolved AST 19and ALT 15.  Increase in LFTs correlates with increased CPK (now normal). CPK reflects activity of polymyositis. Monitor LFTs with each cycle. 4.Normocytic anemia She has a stable mild normocytic anemia.  Hematocrit 32.7.  Hemoglobin 10.2.  MCV  91.3  on 11/08/2020.  Hematocrit 31.6.  Hemoglobin   9.8.  MCV 91.9 on 12/20/2020.  Hematocrit 32.4.  Hemoglobin 10.1.  MCV 92.6 on 01/31/2021.  Continue to monitor 5.   Begin IVIG over the next 4 days. 6.   RTC in 6 weeks for MD assessment, labs (CBC with diff, CMP, CK, aldolase) and IVIG over 4 days.  Addendum: Dr. [name] was contacted regarding the patient's steroids she receives with IVIG.  She receives Solu-Medrol 40 mg with each infusion.  We discussed decreasing her Solu-Medrol to 20 mg with each infusion.  Decrease dose took effect on the second day of her IVIG (02/01/2021).  Further reductions per Dr. [name].  I discussed the assessment and treatment plan with the patient.  The patient was provided an opportunity to ask questions and all were answered.  The patient agreed with the plan and demonstrated an understanding of the instructions.  The patient was advised to call back if the symptoms worsen or if the condition fails to improve as anticipated.   Lequita Asal, MD, PhD    01/31/2021, 6:04 PM

## 2021-01-31 ENCOUNTER — Inpatient Hospital Stay: Payer: Medicare PPO | Attending: Hematology and Oncology

## 2021-01-31 ENCOUNTER — Encounter: Payer: Self-pay | Admitting: Hematology and Oncology

## 2021-01-31 ENCOUNTER — Inpatient Hospital Stay (HOSPITAL_BASED_OUTPATIENT_CLINIC_OR_DEPARTMENT_OTHER): Payer: Medicare PPO | Admitting: Hematology and Oncology

## 2021-01-31 ENCOUNTER — Other Ambulatory Visit: Payer: Self-pay

## 2021-01-31 ENCOUNTER — Inpatient Hospital Stay: Payer: Medicare PPO

## 2021-01-31 VITALS — BP 164/89 | HR 83

## 2021-01-31 VITALS — BP 144/81 | HR 78 | Temp 96.3°F | Resp 16 | Wt 200.7 lb

## 2021-01-31 DIAGNOSIS — M3320 Polymyositis, organ involvement unspecified: Secondary | ICD-10-CM

## 2021-01-31 DIAGNOSIS — Z79899 Other long term (current) drug therapy: Secondary | ICD-10-CM | POA: Diagnosis not present

## 2021-01-31 DIAGNOSIS — D649 Anemia, unspecified: Secondary | ICD-10-CM | POA: Diagnosis not present

## 2021-01-31 LAB — COMPREHENSIVE METABOLIC PANEL
ALT: 15 U/L (ref 0–44)
AST: 19 U/L (ref 15–41)
Albumin: 3.6 g/dL (ref 3.5–5.0)
Alkaline Phosphatase: 43 U/L (ref 38–126)
Anion gap: 13 (ref 5–15)
BUN: 18 mg/dL (ref 8–23)
CO2: 25 mmol/L (ref 22–32)
Calcium: 9.7 mg/dL (ref 8.9–10.3)
Chloride: 102 mmol/L (ref 98–111)
Creatinine, Ser: 0.74 mg/dL (ref 0.44–1.00)
GFR, Estimated: >60 mL/min (ref >60)
Glucose, Bld: 142 mg/dL — ABNORMAL HIGH (ref 70–99)
Potassium: 3.6 mmol/L (ref 3.5–5.1)
Sodium: 140 mmol/L (ref 135–145)
Total Bilirubin: 0.6 mg/dL (ref 0.3–1.2)
Total Protein: 7.1 g/dL (ref 6.5–8.1)

## 2021-01-31 LAB — CK: Total CK: 110 U/L (ref 38–234)

## 2021-01-31 LAB — CBC WITH DIFFERENTIAL/PLATELET
Abs Immature Granulocytes: 0.02 10*3/uL (ref 0.00–0.07)
Basophils Absolute: 0 10*3/uL (ref 0.0–0.1)
Basophils Relative: 1 %
Eosinophils Absolute: 0.1 10*3/uL (ref 0.0–0.5)
Eosinophils Relative: 2 %
HCT: 32.4 % — ABNORMAL LOW (ref 36.0–46.0)
Hemoglobin: 10.1 g/dL — ABNORMAL LOW (ref 12.0–15.0)
Immature Granulocytes: 0 %
Lymphocytes Relative: 26 %
Lymphs Abs: 1.4 10*3/uL (ref 0.7–4.0)
MCH: 28.9 pg (ref 26.0–34.0)
MCHC: 31.2 g/dL (ref 30.0–36.0)
MCV: 92.6 fL (ref 80.0–100.0)
Monocytes Absolute: 0.4 10*3/uL (ref 0.1–1.0)
Monocytes Relative: 7 %
Neutro Abs: 3.6 10*3/uL (ref 1.7–7.7)
Neutrophils Relative %: 64 %
Platelets: 302 10*3/uL (ref 150–400)
RBC: 3.5 MIL/uL — ABNORMAL LOW (ref 3.87–5.11)
RDW: 14.6 % (ref 11.5–15.5)
WBC: 5.6 10*3/uL (ref 4.0–10.5)
nRBC: 0 % (ref 0.0–0.2)

## 2021-01-31 MED ORDER — DIPHENHYDRAMINE HCL 25 MG PO CAPS
ORAL_CAPSULE | ORAL | Status: AC
  Filled 2021-01-31: qty 1

## 2021-01-31 MED ORDER — METHYLPREDNISOLONE SODIUM SUCC 125 MG IJ SOLR
40.0000 mg | INTRAMUSCULAR | Status: DC
  Administered 2021-01-31: 40 mg via INTRAVENOUS
  Filled 2021-01-31: qty 2

## 2021-01-31 MED ORDER — DIPHENHYDRAMINE HCL 25 MG PO TABS
25.0000 mg | ORAL_TABLET | Freq: Once | ORAL | Status: AC
  Administered 2021-01-31: 25 mg via ORAL
  Filled 2021-01-31: qty 1

## 2021-01-31 MED ORDER — DEXTROSE 5 % IV SOLN
INTRAVENOUS | Status: DC
  Filled 2021-01-31: qty 250

## 2021-01-31 MED ORDER — ACETAMINOPHEN 325 MG PO TABS
650.0000 mg | ORAL_TABLET | Freq: Once | ORAL | Status: AC
  Administered 2021-01-31: 650 mg via ORAL
  Filled 2021-01-31: qty 2

## 2021-01-31 MED ORDER — IMMUNE GLOBULIN (HUMAN) 20 GM/200ML IV SOLN
40.0000 g | INTRAVENOUS | Status: DC
  Administered 2021-01-31: 40 g via INTRAVENOUS
  Filled 2021-01-31: qty 400

## 2021-01-31 NOTE — Progress Notes (Signed)
Pt received prescribed treatment in clinic, pt stable at d/c. 

## 2021-02-01 ENCOUNTER — Inpatient Hospital Stay: Payer: Medicare PPO

## 2021-02-01 ENCOUNTER — Other Ambulatory Visit: Payer: Self-pay

## 2021-02-01 VITALS — BP 147/78 | HR 77 | Temp 97.2°F | Resp 16

## 2021-02-01 DIAGNOSIS — Z79899 Other long term (current) drug therapy: Secondary | ICD-10-CM | POA: Diagnosis not present

## 2021-02-01 DIAGNOSIS — M3320 Polymyositis, organ involvement unspecified: Secondary | ICD-10-CM

## 2021-02-01 LAB — ALDOLASE: Aldolase: 4.1 U/L (ref 3.3–10.3)

## 2021-02-01 MED ORDER — METHYLPREDNISOLONE SODIUM SUCC 125 MG IJ SOLR
20.0000 mg | INTRAMUSCULAR | Status: DC
  Administered 2021-02-01: 20 mg via INTRAVENOUS

## 2021-02-01 MED ORDER — METHYLPREDNISOLONE SODIUM SUCC 125 MG IJ SOLR
INTRAMUSCULAR | Status: AC
  Filled 2021-02-01: qty 2

## 2021-02-01 MED ORDER — METHYLPREDNISOLONE SODIUM SUCC 40 MG IJ SOLR
20.0000 mg | INTRAMUSCULAR | Status: DC
  Filled 2021-02-01: qty 1

## 2021-02-01 MED ORDER — DIPHENHYDRAMINE HCL 25 MG PO TABS
25.0000 mg | ORAL_TABLET | Freq: Once | ORAL | Status: AC
  Administered 2021-02-01: 25 mg via ORAL
  Filled 2021-02-01: qty 1

## 2021-02-01 MED ORDER — DEXTROSE 5 % IV SOLN
INTRAVENOUS | Status: DC
  Filled 2021-02-01: qty 250

## 2021-02-01 MED ORDER — IMMUNE GLOBULIN (HUMAN) 20 GM/200ML IV SOLN
40.0000 g | INTRAVENOUS | Status: DC
  Administered 2021-02-01: 40 g via INTRAVENOUS
  Filled 2021-02-01: qty 400

## 2021-02-01 MED ORDER — DIPHENHYDRAMINE HCL 25 MG PO CAPS
ORAL_CAPSULE | ORAL | Status: AC
  Filled 2021-02-01: qty 1

## 2021-02-01 MED ORDER — ACETAMINOPHEN 325 MG PO TABS
650.0000 mg | ORAL_TABLET | Freq: Once | ORAL | Status: AC
  Administered 2021-02-01: 650 mg via ORAL
  Filled 2021-02-01: qty 2

## 2021-02-01 NOTE — Progress Notes (Signed)
Patient received prescribed treatment in clinic. Tolerated well. Patient stable at discharge. 

## 2021-02-02 ENCOUNTER — Other Ambulatory Visit: Payer: Self-pay

## 2021-02-02 ENCOUNTER — Inpatient Hospital Stay: Payer: Medicare PPO

## 2021-02-02 VITALS — BP 147/77 | HR 78 | Temp 96.0°F | Resp 16 | Wt 200.0 lb

## 2021-02-02 DIAGNOSIS — M3320 Polymyositis, organ involvement unspecified: Secondary | ICD-10-CM | POA: Diagnosis not present

## 2021-02-02 DIAGNOSIS — Z79899 Other long term (current) drug therapy: Secondary | ICD-10-CM | POA: Diagnosis not present

## 2021-02-02 MED ORDER — DIPHENHYDRAMINE HCL 25 MG PO TABS
25.0000 mg | ORAL_TABLET | Freq: Once | ORAL | Status: AC
  Administered 2021-02-02: 25 mg via ORAL
  Filled 2021-02-02: qty 1

## 2021-02-02 MED ORDER — DEXTROSE 5 % IV SOLN
INTRAVENOUS | Status: DC
  Filled 2021-02-02: qty 250

## 2021-02-02 MED ORDER — METHYLPREDNISOLONE SODIUM SUCC 125 MG IJ SOLR
20.0000 mg | Freq: Once | INTRAMUSCULAR | Status: AC
  Administered 2021-02-02: 20 mg via INTRAVENOUS
  Filled 2021-02-02: qty 2

## 2021-02-02 MED ORDER — METHYLPREDNISOLONE SODIUM SUCC 40 MG IJ SOLR: 20.0000 mg | INTRAMUSCULAR | Status: DC

## 2021-02-02 MED ORDER — IMMUNE GLOBULIN (HUMAN) 20 GM/200ML IV SOLN
40.0000 g | INTRAVENOUS | Status: DC
  Administered 2021-02-02: 40 g via INTRAVENOUS
  Filled 2021-02-02: qty 400

## 2021-02-02 MED ORDER — ACETAMINOPHEN 325 MG PO TABS
650.0000 mg | ORAL_TABLET | Freq: Once | ORAL | Status: AC
Start: 2021-02-02 — End: 2021-02-02
  Administered 2021-02-02: 650 mg via ORAL
  Filled 2021-02-02: qty 2

## 2021-02-02 NOTE — Progress Notes (Signed)
Patient received prescribed treatment in clinic. IVIG over 3 hours. Tolerated well. Patient stable at discharge.

## 2021-02-03 ENCOUNTER — Inpatient Hospital Stay: Payer: Medicare PPO

## 2021-02-03 VITALS — BP 171/89 | HR 69 | Temp 97.0°F | Resp 18

## 2021-02-03 DIAGNOSIS — M3320 Polymyositis, organ involvement unspecified: Secondary | ICD-10-CM | POA: Diagnosis not present

## 2021-02-03 DIAGNOSIS — Z79899 Other long term (current) drug therapy: Secondary | ICD-10-CM | POA: Diagnosis not present

## 2021-02-03 MED ORDER — IMMUNE GLOBULIN (HUMAN) 20 GM/200ML IV SOLN
40.0000 g | INTRAVENOUS | Status: DC
  Administered 2021-02-03: 40 g via INTRAVENOUS
  Filled 2021-02-03: qty 400

## 2021-02-03 MED ORDER — DEXTROSE 5 % IV SOLN
INTRAVENOUS | Status: DC
  Filled 2021-02-03: qty 250

## 2021-02-03 MED ORDER — METHYLPREDNISOLONE SODIUM SUCC 125 MG IJ SOLR
20.0000 mg | INTRAMUSCULAR | Status: DC
  Administered 2021-02-03: 20 mg via INTRAVENOUS
  Filled 2021-02-03: qty 2

## 2021-02-03 MED ORDER — ACETAMINOPHEN 325 MG PO TABS
650.0000 mg | ORAL_TABLET | Freq: Once | ORAL | Status: AC
  Administered 2021-02-03: 650 mg via ORAL
  Filled 2021-02-03: qty 2

## 2021-02-03 MED ORDER — DIPHENHYDRAMINE HCL 25 MG PO CAPS
ORAL_CAPSULE | ORAL | Status: AC
  Filled 2021-02-03: qty 1

## 2021-02-03 MED ORDER — DIPHENHYDRAMINE HCL 25 MG PO TABS
25.0000 mg | ORAL_TABLET | Freq: Once | ORAL | Status: AC
  Administered 2021-02-03: 25 mg via ORAL
  Filled 2021-02-03: qty 1

## 2021-02-03 NOTE — Progress Notes (Signed)
Patient stable at discharge.

## 2021-02-24 DIAGNOSIS — Z0100 Encounter for examination of eyes and vision without abnormal findings: Secondary | ICD-10-CM | POA: Diagnosis not present

## 2021-02-24 DIAGNOSIS — S0502XA Injury of conjunctiva and corneal abrasion without foreign body, left eye, initial encounter: Secondary | ICD-10-CM | POA: Diagnosis not present

## 2021-03-02 ENCOUNTER — Other Ambulatory Visit: Payer: Self-pay | Admitting: Internal Medicine

## 2021-03-04 DIAGNOSIS — H2513 Age-related nuclear cataract, bilateral: Secondary | ICD-10-CM | POA: Diagnosis not present

## 2021-03-10 NOTE — Progress Notes (Signed)
[facility]  [address], [address] [place],  [address] Phone: [phone]  Fax: [phone]   Clinic Day: 03/14/21  Referring physician: [name], MD  Chief Complaint: Bridget Romero is a 75 y.o. 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  HPI: The patient was last seen in the hematology clinic on 01/31/2021. At that time, she was doing well.  Exam was stable. Hematocrit was 32.4, hemoglobin 10.1, MCV 92.6, platelets 302,000, WBC 5,600.  LFTs were normal.  Aldolase was 4.1. CK was 110.   She received IVIG daily x 4 (01/31/2021 - 02/03/2021). Dr. [name] was contacted regarding the steroids she receives with IVIG. Solu-Medrol was decreased to 20 mg with each infusion.  Decreased dose took effect on the second day of her IVIG (02/01/2021).   During the interim, she has been "pretty good." She did well with decreased steroids. She is seeing Dr. [name] in May.   She has some left groin pain. She has nocturia and uses the bathroom 2-3 times per night. During the day, she does not experience any frequency. She has not had a UTI in a long time. She has also had intermittent right-sided pain. She does not need to use her arms to stand up. The patient takes Gabapentin 2 pills BID. She would like a prescription for pain medication.   Past Medical History:  Diagnosis Date  . Allergy   . Asthma   . Diabetes mellitus   . Hyperlipidemia   . Hypertension   . Neuromuscular disorder (HCC)    polymyositis  . Osteoarthritis   . Vitamin B12 deficiency     Past Surgical History:  Procedure Laterality Date  . COLONOSCOPY WITH PROPOFOL N/A 03/07/2016   Procedure: COLONOSCOPY WITH PROPOFOL;  Surgeon: Lollie Sails, MD;  [facility] ENDOSCOPY;  Service: Endoscopy;  Laterality: N/A;    Family History  Problem Relation Age of Onset  . Heart disease Mother   . Cancer Father   . Breast cancer Neg Hx      Social History:  reports that she has quit smoking. She has never used smokeless tobacco. She reports that she does not drink alcohol and does not use drugs. She is a Training and development officer. She lives in [place]. The patient is alone today.   Allergies:  Allergies  Allergen Reactions  . Atorvastatin Other (See Comments)    myalgias  . Pravastatin Other (See Comments)    Muscle pain    Current Medications: Current Outpatient Medications  Medication Sig Dispense Refill  . Accu-Chek FastClix Lancets MISC USE TO TEST BLOOD SUGAR TWICE DAILY DX E11.40 102 each 11  . acetaminophen (TYLENOL) 500 MG tablet Take 500 mg by mouth every 6 (six) hours as needed.    . Alcohol Swabs (B-D SINGLE USE SWABS REGULAR) PADS Use to test blood sugar once daily dx: 250.00 100 each 3  . B-D TB SYRINGE 1CC/25GX5/8" 25G X 5/8" 1 ML MISC     . Blood Glucose Calibration (ACCU-CHEK AVIVA) SOLN Use as directed 1 each 3  . Cholecalciferol (VITAMIN D) 1000 UNITS capsule Take 1,000 Units by mouth daily.    . folic acid (FOLVITE) 1 MG tablet Take 1 mg by mouth daily.    Marland Kitchen gabapentin (NEURONTIN) 300 MG capsule Take 300 mg by mouth 2 (two) times daily.    Marland Kitchen glucose blood (ACCU-CHEK SMARTVIEW) test strip USE AS DIRECTED TO CHECK BLOOD SUGAR TWICE DAILY 100 each  6  . insulin glargine (LANTUS) 100 UNIT/ML injection Inject 0.2 mLs (20 Units total) into the skin daily. 1 mL 0  . lisinopril (ZESTRIL) 10 MG tablet Take 1 tablet (10 mg total) by mouth daily. 90 tablet 3  . metFORMIN (GLUCOPHAGE) 1000 MG tablet TAKE 1 TABLET BY MOUTH TWICE A DAY 180 tablet 3  . methotrexate 250 MG/10ML injection Inject into the skin.     . Multiple Vitamin (MULTIVITAMIN) tablet Take 1 tablet by mouth daily.    . predniSONE (DELTASONE) 10 MG tablet Take 5 mg by mouth daily.     No current facility-administered medications for this visit.    Review of Systems  Constitutional: Positive for weight loss (1 pound). Negative for chills, diaphoresis, fever and  malaise/fatigue.       Feels "pretty good".  HENT: Negative.  Negative for congestion, ear discharge, ear pain, hearing loss, nosebleeds, sinus pain, sore throat and tinnitus.   Eyes: Negative.  Negative for blurred vision, double vision and photophobia.  Respiratory: Negative.  Negative for cough, hemoptysis, sputum production and shortness of breath.   Cardiovascular: Negative.  Negative for chest pain, palpitations, orthopnea and leg swelling.  Gastrointestinal: Negative.  Negative for abdominal pain, blood in stool, constipation, diarrhea, heartburn, melena, nausea and vomiting.  Genitourinary: Positive for frequency (nocturia 2-3 x per night). Negative for dysuria, hematuria and urgency.  Musculoskeletal: Positive for back pain (right side). Negative for joint pain, myalgias and neck pain.       Left groin pain  Skin: Negative.  Negative for itching and rash.  Neurological: Negative.  Negative for dizziness, tingling, sensory change, focal weakness, weakness and headaches.  Endo/Heme/Allergies: Does not bruise/bleed easily.       Diabetes on insulin and Metformin.   Psychiatric/Behavioral: Negative.  Negative for depression and memory loss. The patient is not nervous/anxious and does not have insomnia.   All other systems reviewed and are negative.  Performance status (ECOG): 1  Vitals Blood pressure (!) 143/71, pulse 70, temperature 97.6 F (36.4 C), temperature source Tympanic, resp. rate 16, height 5\' 6"  (1.676 m), weight 199 lb 1.2 oz (90.3 kg).   Physical Exam Vitals and nursing note reviewed.  Constitutional:      General: She is not in acute distress.    Appearance: Normal appearance. She is well-developed. She is not ill-appearing or diaphoretic.  HENT:     Head: Normocephalic and atraumatic.     Comments: Short dark hair.    Mouth/Throat:     Pharynx: No oropharyngeal exudate or posterior oropharyngeal erythema.  Eyes:     General: No scleral icterus.     Conjunctiva/sclera: Conjunctivae normal.     Pupils: Pupils are equal, round, and reactive to light.     Comments: Brown eyes.  Cardiovascular:     Rate and Rhythm: Normal rate and regular rhythm.     Heart sounds: Normal heart sounds. No murmur heard.   Pulmonary:     Effort: Pulmonary effort is normal. No respiratory distress.     Breath sounds: Normal breath sounds. No wheezing or rales.  Chest:     Chest wall: No tenderness.  Abdominal:     General: Bowel sounds are normal. There is no distension.     Palpations: Abdomen is soft. There is no mass.     Tenderness: There is no abdominal tenderness. There is no guarding or rebound.  Musculoskeletal:        General: No swelling or tenderness. Normal range  of motion.     Cervical back: Normal range of motion and neck supple.  Lymphadenopathy:     Cervical: No cervical adenopathy.  Skin:    General: Skin is warm and dry.     Coloration: Skin is not jaundiced or pale.  Neurological:     Mental Status: She is alert and oriented to person, place, and time.     Sensory: No sensory deficit.     Motor: No weakness.     Comments: Lower extremity strength and sensation are symmetric and normal. Normal patellar reflexes.  Psychiatric:        Behavior: Behavior normal.        Thought Content: Thought content normal.        Judgment: Judgment normal.    Appointment on 03/14/2021  Component Date Value Ref Range Status  . Total CK 03/14/2021 78  38 - 234 U/L Final   Performed at [facility], [address] [place], [place] [address]  . Sodium 03/14/2021 141  135 - 145 mmol/L Final  . Potassium 03/14/2021 3.8  3.5 - 5.1 mmol/L Final  . Chloride 03/14/2021 106  98 - 111 mmol/L Final  . CO2 03/14/2021 26  22 - 32 mmol/L Final  . Glucose, Bld 03/14/2021 117* 70 - 99 mg/dL Final   Glucose reference range applies only to samples taken after fasting for at least 8 hours.  . BUN 03/14/2021 14  8 - 23 mg/dL Final  . Creatinine,  Ser 03/14/2021 0.80  0.44 - 1.00 mg/dL Final  . Calcium 03/14/2021 9.5  8.9 - 10.3 mg/dL Final  . Total Protein 03/14/2021 7.2  6.5 - 8.1 g/dL Final  . Albumin 03/14/2021 3.6  3.5 - 5.0 g/dL Final  . AST 03/14/2021 20  15 - 41 U/L Final  . ALT 03/14/2021 14  0 - 44 U/L Final  . Alkaline Phosphatase 03/14/2021 45  38 - 126 U/L Final  . Total Bilirubin 03/14/2021 0.7  0.3 - 1.2 mg/dL Final  . GFR, Estimated 03/14/2021 >60  >60 mL/min Final   Comment: (NOTE) Calculated using the CKD-EPI Creatinine Equation (2021)   . Anion gap 03/14/2021 9  5 - 15 Final   Performed at [facility], [address] [place], [place] [address]  . WBC 03/14/2021 6.3  4.0 - 10.5 K/uL Final  . RBC 03/14/2021 3.66* 3.87 - 5.11 MIL/uL Final  . Hemoglobin 03/14/2021 10.5* 12.0 - 15.0 g/dL Final  . HCT 03/14/2021 33.1* 36.0 - 46.0 % Final  . MCV 03/14/2021 90.4  80.0 - 100.0 fL Final  . MCH 03/14/2021 28.7  26.0 - 34.0 pg Final  . MCHC 03/14/2021 31.7  30.0 - 36.0 g/dL Final  . RDW 03/14/2021 14.1  11.5 - 15.5 % Final  . Platelets 03/14/2021 294  150 - 400 K/uL Final  . nRBC 03/14/2021 0.0  0.0 - 0.2 % Final  . Neutrophils Relative % 03/14/2021 67  % Final  . Neutro Abs 03/14/2021 4.3  1.7 - 7.7 K/uL Final  . Lymphocytes Relative 03/14/2021 24  % Final  . Lymphs Abs 03/14/2021 1.5  0.7 - 4.0 K/uL Final  . Monocytes Relative 03/14/2021 6  % Final  . Monocytes Absolute 03/14/2021 0.4  0.1 - 1.0 K/uL Final  . Eosinophils Relative 03/14/2021 2  % Final  . Eosinophils Absolute 03/14/2021 0.1  0.0 - 0.5 K/uL Final  . Basophils Relative 03/14/2021 1  % Final  . Basophils Absolute  03/14/2021 0.0  0.0 - 0.1 K/uL Final  . Immature Granulocytes 03/14/2021 0  % Final  . Abs Immature Granulocytes 03/14/2021 0.02  0.00 - 0.07 K/uL Final   Performed at [facility], [address] [place], [place] [address]    Assessment:  TERREL MANALO is a 75 y.o. 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 She receives IVIG  monthly,methotrexate weekly, and prednisone5mg  a day.  CKhas been followed (38-234): >5555 on 05/30/2017, 4409 on 06/20/2017, 3546 on 07/05/2017, 2725 on 07/30/2017, 3675 on 08/28/2017, 2405 on 10/08/2017, 2743 on 11/08/2017, 2723 on 12/20/2017, 723 on 01/21/2018, 507 on 02/21/2018, 2192 on 04/11/2018, 2227 on 06/04/2018, 4468 on 07/22/2018, 2204 on 09/06/2018, 1360 on 12/02/2018, 1423 on 01/01/2019,912 on 03/03/2019, 3955 on 04/21/2019,4612 on 05/06/2019,4560 on 05/20/2019, 4532 on 06/16/2019,4935 on 07/10/2019,4330 on 07/28/2019, 3645 on 08/13/2019, 2514 on 08/25/2019, 807 on 09/22/2019, 251 on 10/21/2019, 67 on 11/25/2019, 66 on 12/22/2019, < 5 on 01/19/2020, 67 on 02/02/2020, 254 on 03/15/2020, 338 on 03/29/2020, 847 on 05/25/2020, 854 on 07/05/2020, 702 on 08/16/2020, 811 on 09/27/2020, 304 on 11/08/2020, 138 on 12/20/2020, 110 on 01/31/2021, and 78 on 03/14/2021.  Aldolasehas been followed (3.3-10.3): 67.2 in06/05/2017, 69.9 on06/27/2018, 64.8 on07/11/2017,57.6 on08/05/2017, 60.3 on09/03/2017, 38.7 on 12/20/2017, 9.3 on01/28/2019, 7.4 on02/28/2019, 29.1 on04/18/2019, to51.9 on07/29/2019, 23.7 on09/13/2019, 16.4 on 12/02/2018,14.1 on 01/01/2019,8.8 on 03/03/2019, 53.4 on 05/06/2019,42.9 on 05/20/2019, 39.0 in 06/16/2019, 69.3 on 07/28/2019, 37.2 on 08/25/2019, 12.5 on 09/22/2019, 7.2 on 10/21/2019, 5.2 on 11/25/2019, 4.8 on 12/22/2019, 3.9 on 01/19/2020, 4.2 on 02/02/2020, 5.5 on 03/15/2020, 5.6 on 03/29/2020, 9.1 on 05/25/2020, 11.6 on 07/05/2020, 11.4 on 08/16/2020, 10.8 on 09/27/2020, 5.2 on 11/08/2020, 4.6 on 12/20/2020, 4.1 on 01/31/2021, and 3.6 on 03/14/2021.  She received IVIG (Privigen) 75 gm x 2 days every 4 weeks beginning 12/07/2017.IVIG was decreased to every 8 weeks on 04/22/2019, butthen returned to monthly secondary to rise CK.IVIG wasincreased to 2 gm/kg on08/31/2020,09/23/2019, 10/21/2019, 11/25/2019, 02/02/2020, 03/29/2020, 05/25/2020, 07/05/2020,  08/16/2020, 09/27/2020, 11/08/2020, and 12/20/2020.  She was switched to every 8 weeks then every 6 weeks.  She has a normocytic anemia. Baseline hemoglobin is 10. Anemia work-upon 12/22/2019 revealed a hematocritwas32.4, hemoglobin 10.0, MCV 92.8, platelets 258,000, WBC 6200, ANC 3700. Reticwas 3.8%.[id number].Coombs was negative. Peripheralsmearrevealed anormocytic anemia. The morphologyof the RBCs, WBCs and platelets were within normal limits.  Last colonoscopy on 03/07/2016 revealed grade I hemorrhoids.She has B12 deficiencyand is on oral B12. B12 was 306 and folate >22.3 on 05/30/2017.   She has chronicmild elevation in LFTswhich have correlated with increased CPK.AST/ALThave been followed:53/54 on 02/17/2019, 107/128 on04/27/2020, 134/200 on05/26/2020,138/261 on 07/10/2019,151/304 on08/02/2019, 175/289 on 08/13/2019, 102/196 on 08/25/2019, 47/75 on 09/22/2019, 12/23 on 11/25/2019, 19/16 on 12/22/2019, 20/16 on 01/19/2020, 19/15 on 02/02/2020, 22/17 on 03/15/2020, 25/19 on 03/29/2020, 40/36 on 05/25/2020, 42/46 on 07/05/2020, 37/48 on 08/16/2020, 36/44 on 09/27/2020, 24/23 on 11/08/2020, 21/16 on 12/20/2020, 19/15 on 01/31/2021 and 20/14 on 03/14/2021. Hepatitis B and C testing werenegative on 06/27/2018and 07/28/2019.  Symptomatically, she feels "pretty good." She did well with decreased steroid premeds. She has also had intermittent right-sided pain.  She has nocturia.  Exam is stable.  Plan: 1.   Labs today: CBC with diff, CMP, CK, aldolase. 2.Polymyositis Clinically, she is doing well on IVIG 2 g/kg every 6 weeks. She is on prednisone 5 mg a day and MTX10mg SQweekly. CK is 78 (normal).  Aldolase is 3.6 (normal). Continue IVIG every 6 weeks.  Continue to decrease steroid premedications.  Labs reviewed.  Begin IVIG x4 days beginning today. 3.Elevated  LFTs, resolved AST  20and ALT 14.  Increase in LFTs correlates with increased CPK (now normal). CPK reflects activity of polymyositis. Dr. LFTs with each cycle. 4.Normocytic anemia She has a stable mild normocytic anemia.  Hematocrit 32.7.  Hemoglobin 10.2.  MCV 91.3 on 11/08/2020.Marland Kitchen  Hematocrit 33.1.  Hemoglobin 10.5.  MCV 90.4 on 03/14/2021  Continue to monitor. 5.   Urinary frequency  UA and culture. 6.   Begin IVIG over the next 4 days. 7.   RTC in 6 weeks for MD assessment, labs (CBC with diff, CMP, CK, aldolase) and IVIG over 4 days.  I discussed the assessment and treatment plan with the patient.  The patient was provided an opportunity to ask questions and all were answered.  The patient agreed with the plan and demonstrated an understanding of the instructions.  The patient was advised to call back if the symptoms worsen or if the condition fails to improve as anticipated.   Lequita Asal, MD, PhD 03/14/2021, 5:00 PM  I, De Burrs, am acting as Education administrator for [employer]. Mike Gip, MD, PhD.  I, Janette Harvie C. Mike Gip, MD, have reviewed the above documentation for accuracy and completeness, and I agree with the above.

## 2021-03-14 ENCOUNTER — Inpatient Hospital Stay (HOSPITAL_BASED_OUTPATIENT_CLINIC_OR_DEPARTMENT_OTHER): Payer: Medicare PPO | Admitting: Hematology and Oncology

## 2021-03-14 ENCOUNTER — Encounter: Payer: Self-pay | Admitting: Hematology and Oncology

## 2021-03-14 ENCOUNTER — Inpatient Hospital Stay: Payer: Medicare PPO

## 2021-03-14 ENCOUNTER — Other Ambulatory Visit: Payer: Self-pay

## 2021-03-14 ENCOUNTER — Inpatient Hospital Stay: Payer: Medicare PPO | Attending: Hematology and Oncology

## 2021-03-14 VITALS — BP 182/78 | HR 60

## 2021-03-14 VITALS — BP 143/71 | HR 70 | Temp 97.6°F | Resp 16 | Ht 66.0 in | Wt 199.1 lb

## 2021-03-14 DIAGNOSIS — M3320 Polymyositis, organ involvement unspecified: Secondary | ICD-10-CM | POA: Insufficient documentation

## 2021-03-14 DIAGNOSIS — D649 Anemia, unspecified: Secondary | ICD-10-CM | POA: Diagnosis not present

## 2021-03-14 DIAGNOSIS — Z79899 Other long term (current) drug therapy: Secondary | ICD-10-CM | POA: Diagnosis not present

## 2021-03-14 DIAGNOSIS — R350 Frequency of micturition: Secondary | ICD-10-CM | POA: Insufficient documentation

## 2021-03-14 DIAGNOSIS — R7989 Other specified abnormal findings of blood chemistry: Secondary | ICD-10-CM

## 2021-03-14 LAB — CBC WITH DIFFERENTIAL/PLATELET
Abs Immature Granulocytes: 0.02 10*3/uL (ref 0.00–0.07)
Basophils Absolute: 0 10*3/uL (ref 0.0–0.1)
Basophils Relative: 1 %
Eosinophils Absolute: 0.1 10*3/uL (ref 0.0–0.5)
Eosinophils Relative: 2 %
HCT: 33.1 % — ABNORMAL LOW (ref 36.0–46.0)
Hemoglobin: 10.5 g/dL — ABNORMAL LOW (ref 12.0–15.0)
Immature Granulocytes: 0 %
Lymphocytes Relative: 24 %
Lymphs Abs: 1.5 10*3/uL (ref 0.7–4.0)
MCH: 28.7 pg (ref 26.0–34.0)
MCHC: 31.7 g/dL (ref 30.0–36.0)
MCV: 90.4 fL (ref 80.0–100.0)
Monocytes Absolute: 0.4 10*3/uL (ref 0.1–1.0)
Monocytes Relative: 6 %
Neutro Abs: 4.3 10*3/uL (ref 1.7–7.7)
Neutrophils Relative %: 67 %
Platelets: 294 10*3/uL (ref 150–400)
RBC: 3.66 MIL/uL — ABNORMAL LOW (ref 3.87–5.11)
RDW: 14.1 % (ref 11.5–15.5)
WBC: 6.3 10*3/uL (ref 4.0–10.5)
nRBC: 0 % (ref 0.0–0.2)

## 2021-03-14 LAB — COMPREHENSIVE METABOLIC PANEL
ALT: 14 U/L (ref 0–44)
AST: 20 U/L (ref 15–41)
Albumin: 3.6 g/dL (ref 3.5–5.0)
Alkaline Phosphatase: 45 U/L (ref 38–126)
Anion gap: 9 (ref 5–15)
BUN: 14 mg/dL (ref 8–23)
CO2: 26 mmol/L (ref 22–32)
Calcium: 9.5 mg/dL (ref 8.9–10.3)
Chloride: 106 mmol/L (ref 98–111)
Creatinine, Ser: 0.8 mg/dL (ref 0.44–1.00)
GFR, Estimated: >60 mL/min (ref >60)
Glucose, Bld: 117 mg/dL — ABNORMAL HIGH (ref 70–99)
Potassium: 3.8 mmol/L (ref 3.5–5.1)
Sodium: 141 mmol/L (ref 135–145)
Total Bilirubin: 0.7 mg/dL (ref 0.3–1.2)
Total Protein: 7.2 g/dL (ref 6.5–8.1)

## 2021-03-14 LAB — URINALYSIS, COMPLETE (UACMP) WITH MICROSCOPIC
Bilirubin Urine: NEGATIVE
Glucose, UA: NEGATIVE mg/dL
Hgb urine dipstick: NEGATIVE
Ketones, ur: NEGATIVE mg/dL
Leukocytes,Ua: NEGATIVE
Nitrite: NEGATIVE
Protein, ur: NEGATIVE mg/dL
Specific Gravity, Urine: 1.025 (ref 1.005–1.030)
pH: 5 (ref 5.0–8.0)

## 2021-03-14 LAB — CK: Total CK: 78 U/L (ref 38–234)

## 2021-03-14 MED ORDER — DIPHENHYDRAMINE HCL 25 MG PO TABS
25.0000 mg | ORAL_TABLET | Freq: Once | ORAL | Status: AC
  Administered 2021-03-14: 25 mg via ORAL
  Filled 2021-03-14: qty 1

## 2021-03-14 MED ORDER — METHYLPREDNISOLONE SODIUM SUCC 125 MG IJ SOLR: 125.0000 mg | Freq: Once | INTRAMUSCULAR | Status: DC

## 2021-03-14 MED ORDER — ACETAMINOPHEN 325 MG PO TABS
650.0000 mg | ORAL_TABLET | Freq: Once | ORAL | Status: AC
  Administered 2021-03-14: 650 mg via ORAL
  Filled 2021-03-14: qty 2

## 2021-03-14 MED ORDER — METHYLPREDNISOLONE SODIUM SUCC 125 MG IJ SOLR
INTRAMUSCULAR | Status: AC
  Filled 2021-03-14: qty 2

## 2021-03-14 MED ORDER — DEXTROSE 5 % IV SOLN
Freq: Once | INTRAVENOUS | Status: AC
  Filled 2021-03-14: qty 250

## 2021-03-14 MED ORDER — METHYLPREDNISOLONE SODIUM SUCC 40 MG IJ SOLR: 10.0000 mg | INTRAMUSCULAR | Status: DC

## 2021-03-14 MED ORDER — DIPHENHYDRAMINE HCL 25 MG PO CAPS
ORAL_CAPSULE | ORAL | Status: AC
  Filled 2021-03-14: qty 1

## 2021-03-14 MED ORDER — IMMUNE GLOBULIN (HUMAN) 20 GM/200ML IV SOLN
40.0000 g | INTRAVENOUS | Status: AC
  Administered 2021-03-14: 40 g via INTRAVENOUS
  Filled 2021-03-14: qty 400

## 2021-03-14 MED ORDER — METHYLPREDNISOLONE SODIUM SUCC 40 MG IJ SOLR
10.0000 mg | Freq: Once | INTRAMUSCULAR | Status: DC
  Filled 2021-03-14: qty 1

## 2021-03-14 MED ORDER — METHYLPREDNISOLONE SODIUM SUCC 125 MG IJ SOLR
10.0000 mg | Freq: Once | INTRAMUSCULAR | Status: AC
  Administered 2021-03-14: 10 mg via INTRAVENOUS

## 2021-03-14 NOTE — Progress Notes (Signed)
Pt doing well, celebrated her birthday this weekend. She eating and drinking good. She has occasional numbness in her toes at times and she will take gabapentin for it.

## 2021-03-15 ENCOUNTER — Other Ambulatory Visit: Payer: Self-pay

## 2021-03-15 ENCOUNTER — Inpatient Hospital Stay: Payer: Medicare PPO

## 2021-03-15 VITALS — BP 156/62 | HR 85 | Resp 16

## 2021-03-15 DIAGNOSIS — M3320 Polymyositis, organ involvement unspecified: Secondary | ICD-10-CM | POA: Diagnosis not present

## 2021-03-15 DIAGNOSIS — Z79899 Other long term (current) drug therapy: Secondary | ICD-10-CM | POA: Diagnosis not present

## 2021-03-15 LAB — URINE CULTURE: Culture: NO GROWTH

## 2021-03-15 LAB — ALDOLASE: Aldolase: 3.6 U/L (ref 3.3–10.3)

## 2021-03-15 MED ORDER — DEXTROSE 5 % IV SOLN
INTRAVENOUS | Status: DC
  Filled 2021-03-15: qty 250

## 2021-03-15 MED ORDER — DIPHENHYDRAMINE HCL 25 MG PO TABS
25.0000 mg | ORAL_TABLET | Freq: Once | ORAL | Status: AC
  Administered 2021-03-15: 25 mg via ORAL
  Filled 2021-03-15: qty 1

## 2021-03-15 MED ORDER — IMMUNE GLOBULIN (HUMAN) 20 GM/200ML IV SOLN
40.0000 g | INTRAVENOUS | Status: DC
  Administered 2021-03-15: 40 g via INTRAVENOUS
  Filled 2021-03-15: qty 400

## 2021-03-15 MED ORDER — ACETAMINOPHEN 325 MG PO TABS
650.0000 mg | ORAL_TABLET | Freq: Once | ORAL | Status: AC
  Administered 2021-03-15: 650 mg via ORAL
  Filled 2021-03-15: qty 2

## 2021-03-15 MED ORDER — DIPHENHYDRAMINE HCL 25 MG PO CAPS
ORAL_CAPSULE | ORAL | Status: AC
  Filled 2021-03-15: qty 1

## 2021-03-15 NOTE — Progress Notes (Signed)
Pt received prescribed treatment in clinic, pt stable at d/c. 

## 2021-03-16 ENCOUNTER — Inpatient Hospital Stay: Payer: Medicare PPO

## 2021-03-16 ENCOUNTER — Other Ambulatory Visit: Payer: Self-pay

## 2021-03-16 VITALS — BP 165/93 | HR 84 | Temp 96.7°F | Resp 18

## 2021-03-16 DIAGNOSIS — M3320 Polymyositis, organ involvement unspecified: Secondary | ICD-10-CM | POA: Diagnosis not present

## 2021-03-16 DIAGNOSIS — Z79899 Other long term (current) drug therapy: Secondary | ICD-10-CM | POA: Diagnosis not present

## 2021-03-16 MED ORDER — IMMUNE GLOBULIN (HUMAN) 20 GM/200ML IV SOLN
40.0000 g | INTRAVENOUS | Status: DC
  Administered 2021-03-16: 40 g via INTRAVENOUS
  Filled 2021-03-16: qty 400

## 2021-03-16 MED ORDER — DIPHENHYDRAMINE HCL 25 MG PO CAPS
ORAL_CAPSULE | ORAL | Status: AC
  Filled 2021-03-16: qty 1

## 2021-03-16 MED ORDER — METHYLPREDNISOLONE SODIUM SUCC 125 MG IJ SOLR
10.0000 mg | INTRAMUSCULAR | Status: DC
  Administered 2021-03-16: 10 mg via INTRAVENOUS
  Filled 2021-03-16: qty 2

## 2021-03-16 MED ORDER — DEXTROSE 5 % IV SOLN
INTRAVENOUS | Status: DC
  Filled 2021-03-16: qty 250

## 2021-03-16 MED ORDER — DIPHENHYDRAMINE HCL 25 MG PO TABS
25.0000 mg | ORAL_TABLET | Freq: Once | ORAL | Status: AC
Start: 2021-03-16 — End: 2021-03-16
  Administered 2021-03-16: 25 mg via ORAL
  Filled 2021-03-16: qty 1

## 2021-03-16 MED ORDER — ACETAMINOPHEN 325 MG PO TABS
650.0000 mg | ORAL_TABLET | Freq: Once | ORAL | Status: AC
  Administered 2021-03-16: 650 mg via ORAL
  Filled 2021-03-16: qty 2

## 2021-03-16 NOTE — Progress Notes (Signed)
Pt received prescribed treatment in clinic, pt stable at d/c. 

## 2021-03-17 ENCOUNTER — Inpatient Hospital Stay: Payer: Medicare PPO

## 2021-03-17 VITALS — BP 150/83 | HR 63 | Temp 96.0°F | Resp 18

## 2021-03-17 DIAGNOSIS — Z79899 Other long term (current) drug therapy: Secondary | ICD-10-CM | POA: Diagnosis not present

## 2021-03-17 DIAGNOSIS — M3320 Polymyositis, organ involvement unspecified: Secondary | ICD-10-CM | POA: Diagnosis not present

## 2021-03-17 MED ORDER — DIPHENHYDRAMINE HCL 25 MG PO CAPS
ORAL_CAPSULE | ORAL | Status: AC
  Filled 2021-03-17: qty 1

## 2021-03-17 MED ORDER — DEXTROSE 5 % IV SOLN
INTRAVENOUS | Status: DC
  Filled 2021-03-17: qty 250

## 2021-03-17 MED ORDER — METHYLPREDNISOLONE SODIUM SUCC 125 MG IJ SOLR
10.0000 mg | INTRAMUSCULAR | Status: DC
  Administered 2021-03-17: 10 mg via INTRAVENOUS
  Filled 2021-03-17: qty 2

## 2021-03-17 MED ORDER — ACETAMINOPHEN 325 MG PO TABS
650.0000 mg | ORAL_TABLET | Freq: Once | ORAL | Status: AC
Start: 2021-03-17 — End: 2021-03-17
  Administered 2021-03-17: 650 mg via ORAL
  Filled 2021-03-17: qty 2

## 2021-03-17 MED ORDER — IMMUNE GLOBULIN (HUMAN) 20 GM/200ML IV SOLN
40.0000 g | INTRAVENOUS | Status: DC
  Administered 2021-03-17: 40 g via INTRAVENOUS
  Filled 2021-03-17: qty 400

## 2021-03-17 MED ORDER — DIPHENHYDRAMINE HCL 25 MG PO TABS
25.0000 mg | ORAL_TABLET | Freq: Once | ORAL | Status: AC
  Administered 2021-03-17: 25 mg via ORAL
  Filled 2021-03-17: qty 1

## 2021-03-17 NOTE — Progress Notes (Signed)
Pt received prescribed treatment in clinic, pt stable at d/c. 

## 2021-03-23 ENCOUNTER — Encounter: Payer: Self-pay | Admitting: Internal Medicine

## 2021-03-23 ENCOUNTER — Other Ambulatory Visit: Payer: Self-pay

## 2021-03-23 ENCOUNTER — Ambulatory Visit: Payer: Medicare PPO | Admitting: Internal Medicine

## 2021-03-23 VITALS — BP 122/80 | HR 69 | Temp 96.9°F | Ht 66.0 in | Wt 200.0 lb

## 2021-03-23 DIAGNOSIS — R351 Nocturia: Secondary | ICD-10-CM

## 2021-03-23 NOTE — Progress Notes (Signed)
Subjective:    Patient ID: Bridget Romero, female    DOB: 1946-02-19, 75 y.o.   MRN: [id number]  HPI Here due to voiding at night This visit occurred during the SARS-CoV-2 public health emergency.  Safety protocols were in place, including screening questions prior to the visit, additional usage of staff PPE, and extensive cleaning of exam room while observing appropriate contact time as indicated for disinfecting solutions.   Usually getting up 2-3 times a night Didn't get up at all last night Occasionally has trouble getting back to sleep No daytime urgency  No edema  Continues on gabapentin twice a day for neuropathy  Current Outpatient Medications on File Prior to Visit  Medication Sig Dispense Refill  . Accu-Chek FastClix Lancets MISC USE TO TEST BLOOD SUGAR TWICE DAILY DX E11.40 102 each 11  . acetaminophen (TYLENOL) 500 MG tablet Take 500 mg by mouth every 6 (six) hours as needed.    . Alcohol Swabs (B-D SINGLE USE SWABS REGULAR) PADS Use to test blood sugar once daily dx: 250.00 100 each 3  . B-D TB SYRINGE 1CC/25GX5/8" 25G X 5/8" 1 ML MISC     . Blood Glucose Calibration (ACCU-CHEK AVIVA) SOLN Use as directed 1 each 3  . Cholecalciferol (VITAMIN D) 1000 UNITS capsule Take 1,000 Units by mouth daily.    . folic acid (FOLVITE) 1 MG tablet Take 1 mg by mouth daily.    Marland Kitchen gabapentin (NEURONTIN) 300 MG capsule Take 300 mg by mouth 2 (two) times daily.    Marland Kitchen glucose blood (ACCU-CHEK SMARTVIEW) test strip USE AS DIRECTED TO CHECK BLOOD SUGAR TWICE DAILY 100 each 6  . insulin glargine (LANTUS) 100 UNIT/ML injection Inject 0.2 mLs (20 Units total) into the skin daily. 1 mL 0  . lisinopril (ZESTRIL) 10 MG tablet Take 1 tablet (10 mg total) by mouth daily. 90 tablet 3  . metFORMIN (GLUCOPHAGE) 1000 MG tablet TAKE 1 TABLET BY MOUTH TWICE A DAY 180 tablet 3  . methotrexate 250 MG/10ML injection Inject into the skin.     . Multiple Vitamin (MULTIVITAMIN) tablet Take 1 tablet by mouth  daily.    . predniSONE (DELTASONE) 10 MG tablet Take 5 mg by mouth daily.     No current facility-administered medications on file prior to visit.    Allergies  Allergen Reactions  . Atorvastatin Other (See Comments)    myalgias  . Pravastatin Other (See Comments)    Muscle pain    Past Medical History:  Diagnosis Date  . Allergy   . Asthma   . Diabetes mellitus   . Hyperlipidemia   . Hypertension   . Neuromuscular disorder (HCC)    polymyositis  . Osteoarthritis   . Vitamin B12 deficiency     Past Surgical History:  Procedure Laterality Date  . COLONOSCOPY WITH PROPOFOL N/A 03/07/2016   Procedure: COLONOSCOPY WITH PROPOFOL;  Surgeon: Lollie Sails, MD;  Location: [facility] ENDOSCOPY;  Service: Endoscopy;  Laterality: N/A;    Family History  Problem Relation Age of Onset  . Heart disease Mother   . Cancer Father   . Breast cancer Neg Hx     Social History   Socioeconomic History  . Marital status: Widowed    Spouse name: Not on file  . Number of children: 2  . Years of education: Not on file  . Highest education level: Not on file  Occupational History  . Occupation: Retired as Training and development officer at [employer]  Comment:    Tobacco Use  . Smoking status: Former Research scientist (life sciences)  . Smokeless tobacco: Never Used  . Tobacco comment: age 81 when stopped smoking  Vaping Use  . Vaping Use: Never used  Substance and Sexual Activity  . Alcohol use: No  . Drug use: No  . Sexual activity: Not on file  Other Topics Concern  . Not on file  Social History Narrative   has living will   Son and daughter should make health care decisions for her   Would accept resuscitation   Not sure about tube feeds   Social Determinants of Health   Financial Resource Strain: Not on file  Food Insecurity: Not on file  Transportation Needs: Not on file  Physical Activity: Not on file  Stress: Not on file  Social Connections: Not on file  Intimate Partner Violence: Not on file    Review of Systems No hematuria or dysuria No fever Does use salt on her food    Objective:   Physical Exam Abdominal:     Palpations: Abdomen is soft.     Tenderness: There is no abdominal tenderness.  Musculoskeletal:     Right lower leg: No edema.     Left lower leg: No edema.            Assessment & Plan:

## 2021-03-23 NOTE — Patient Instructions (Signed)
Please try cutting down on salt and consider getting tighter support hose. Take the gabapentin in the morning---and then pretty close to bedtime. If you have ongoing problems with getting up to urinate---we can increase the gabapentin to 2 capsules at bedtime.

## 2021-03-23 NOTE — Assessment & Plan Note (Signed)
Not really having significant daytime symptoms---so not classic overactive bladder No evidence of infection  Discussed cutting down on salt and wearing tighter support stockings  She may do better if she takes the gabapentin just before bedtime--we could also try increasing the dose if needed

## 2021-04-20 ENCOUNTER — Other Ambulatory Visit: Payer: Self-pay

## 2021-04-20 DIAGNOSIS — M3320 Polymyositis, organ involvement unspecified: Secondary | ICD-10-CM

## 2021-04-20 DIAGNOSIS — M3329 Polymyositis with other organ involvement: Secondary | ICD-10-CM

## 2021-04-20 DIAGNOSIS — R7989 Other specified abnormal findings of blood chemistry: Secondary | ICD-10-CM

## 2021-04-20 DIAGNOSIS — D649 Anemia, unspecified: Secondary | ICD-10-CM

## 2021-04-20 DIAGNOSIS — Z79899 Other long term (current) drug therapy: Secondary | ICD-10-CM

## 2021-04-25 ENCOUNTER — Encounter: Payer: Self-pay | Admitting: Oncology

## 2021-04-25 ENCOUNTER — Other Ambulatory Visit: Payer: Self-pay

## 2021-04-25 ENCOUNTER — Inpatient Hospital Stay: Payer: Medicare PPO | Attending: Hematology and Oncology | Admitting: Oncology

## 2021-04-25 ENCOUNTER — Inpatient Hospital Stay: Payer: Medicare PPO

## 2021-04-25 ENCOUNTER — Inpatient Hospital Stay: Payer: Medicare PPO | Admitting: Oncology

## 2021-04-25 VITALS — BP 162/81 | HR 72 | Temp 97.6°F | Resp 18 | Wt 206.0 lb

## 2021-04-25 DIAGNOSIS — Z79899 Other long term (current) drug therapy: Secondary | ICD-10-CM

## 2021-04-25 DIAGNOSIS — D649 Anemia, unspecified: Secondary | ICD-10-CM

## 2021-04-25 DIAGNOSIS — M3320 Polymyositis, organ involvement unspecified: Secondary | ICD-10-CM | POA: Insufficient documentation

## 2021-04-25 DIAGNOSIS — M3329 Polymyositis with other organ involvement: Secondary | ICD-10-CM

## 2021-04-25 DIAGNOSIS — R7989 Other specified abnormal findings of blood chemistry: Secondary | ICD-10-CM

## 2021-04-25 LAB — COMPREHENSIVE METABOLIC PANEL
ALT: 14 U/L (ref 0–44)
AST: 25 U/L (ref 15–41)
Albumin: 3.6 g/dL (ref 3.5–5.0)
Alkaline Phosphatase: 45 U/L (ref 38–126)
Anion gap: 9 (ref 5–15)
BUN: 17 mg/dL (ref 8–23)
CO2: 26 mmol/L (ref 22–32)
Calcium: 9 mg/dL (ref 8.9–10.3)
Chloride: 104 mmol/L (ref 98–111)
Creatinine, Ser: 0.78 mg/dL (ref 0.44–1.00)
GFR, Estimated: >60 mL/min (ref >60)
Glucose, Bld: 200 mg/dL — ABNORMAL HIGH (ref 70–99)
Potassium: 3.6 mmol/L (ref 3.5–5.1)
Sodium: 139 mmol/L (ref 135–145)
Total Bilirubin: 0.5 mg/dL (ref 0.3–1.2)
Total Protein: 7 g/dL (ref 6.5–8.1)

## 2021-04-25 LAB — MAGNESIUM: Magnesium: 1.6 mg/dL — ABNORMAL LOW (ref 1.7–2.4)

## 2021-04-25 LAB — CBC WITH DIFFERENTIAL/PLATELET
Abs Immature Granulocytes: 0.01 10*3/uL (ref 0.00–0.07)
Basophils Absolute: 0 10*3/uL (ref 0.0–0.1)
Basophils Relative: 1 %
Eosinophils Absolute: 0.2 10*3/uL (ref 0.0–0.5)
Eosinophils Relative: 3 %
HCT: 31.5 % — ABNORMAL LOW (ref 36.0–46.0)
Hemoglobin: 10.1 g/dL — ABNORMAL LOW (ref 12.0–15.0)
Immature Granulocytes: 0 %
Lymphocytes Relative: 23 %
Lymphs Abs: 1.2 10*3/uL (ref 0.7–4.0)
MCH: 29 pg (ref 26.0–34.0)
MCHC: 32.1 g/dL (ref 30.0–36.0)
MCV: 90.5 fL (ref 80.0–100.0)
Monocytes Absolute: 0.3 10*3/uL (ref 0.1–1.0)
Monocytes Relative: 6 %
Neutro Abs: 3.5 10*3/uL (ref 1.7–7.7)
Neutrophils Relative %: 67 %
Platelets: 287 10*3/uL (ref 150–400)
RBC: 3.48 MIL/uL — ABNORMAL LOW (ref 3.87–5.11)
RDW: 14.5 % (ref 11.5–15.5)
WBC: 5.3 10*3/uL (ref 4.0–10.5)
nRBC: 0 % (ref 0.0–0.2)

## 2021-04-25 LAB — CK: Total CK: 106 U/L (ref 38–234)

## 2021-04-25 MED ORDER — DIPHENHYDRAMINE HCL 25 MG PO CAPS
ORAL_CAPSULE | ORAL | Status: AC
  Filled 2021-04-25: qty 1

## 2021-04-25 MED ORDER — DEXTROSE 5 % IV SOLN
INTRAVENOUS | Status: DC
  Filled 2021-04-25: qty 250

## 2021-04-25 MED ORDER — ACETAMINOPHEN 325 MG PO TABS
650.0000 mg | ORAL_TABLET | Freq: Once | ORAL | Status: AC
  Administered 2021-04-25: 650 mg via ORAL
  Filled 2021-04-25: qty 2

## 2021-04-25 MED ORDER — DIPHENHYDRAMINE HCL 25 MG PO TABS
25.0000 mg | ORAL_TABLET | Freq: Once | ORAL | Status: AC
  Administered 2021-04-25: 25 mg via ORAL
  Filled 2021-04-25: qty 1

## 2021-04-25 MED ORDER — METHYLPREDNISOLONE SODIUM SUCC 125 MG IJ SOLR
10.0000 mg | INTRAMUSCULAR | Status: DC
  Administered 2021-04-25: 10 mg via INTRAVENOUS
  Filled 2021-04-25: qty 2

## 2021-04-25 MED ORDER — IMMUNE GLOBULIN (HUMAN) 20 GM/200ML IV SOLN
40.0000 g | INTRAVENOUS | Status: DC
  Administered 2021-04-25: 40 g via INTRAVENOUS
  Filled 2021-04-25: qty 400

## 2021-04-25 NOTE — Progress Notes (Signed)
[facility]  [address] [place], [place] [address] Phone: [phone]  Fax: [phone]   Clinic Day: 04/25/21  Referring physician: [name], MD  Chief Complaint: Bridget Romero is a 75 y.o. 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  HPI: The patient was last seen in the hematology clinic on 03/14/2021. At that time, she had some left groin pain. She had nocturia and uses the bathroom 2-3 times per night. During the day, she denied UTI symptoms and had not had a UTI in quite some time.  She had intermittent right-sided pain.  Continue gabapentin 2 pills twice daily.    She received IVIG daily x 4 (01/31/2021 - 02/03/2021). Dr. [name] was contacted regarding the steroids she receives with IVIG. Solu-Medrol was decreased to 20 mg with each infusion.  Decreased dose took effect on the second day of her IVIG (02/01/2021).   During the interim, she was seen by Dr. Silvio Pate for nocturia.  He recommended wearing compression stockings and taking gabapentin right before bedtime.  Other options included increasing the dose if needed.  Today, she reports improvement of her nocturia.  She uses the bathroom nightly instead of 3-4 times per night.  Admits to weight gain. Denies any increased weakness and or cutaneous eruption.  No changes in ADLs.  No increased pain, swelling or stiffness in the joints.  No significant fatigue or shortness of breath with exertion.  No photosensitivity or pruritus.  Denies dysphagia, cough or fever.    Past Medical History:  Diagnosis Date  . Allergy   . Asthma   . Diabetes mellitus   . Hyperlipidemia   . Hypertension   . Neuromuscular disorder (HCC)    polymyositis  . Osteoarthritis   . Vitamin B12 deficiency     Past Surgical History:  Procedure Laterality Date  . COLONOSCOPY WITH PROPOFOL N/A 03/07/2016   Procedure: COLONOSCOPY WITH PROPOFOL;  Surgeon: Lollie Sails, MD;  Location: [facility] ENDOSCOPY;  Service: Endoscopy;  Laterality: N/A;    Family History  Problem Relation Age of Onset  . Heart disease Mother   . Cancer Father   . Breast cancer Neg Hx     Social History:  reports that she has quit smoking. She has never used smokeless tobacco. She reports that she does not drink alcohol and does not use drugs. 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 The patient is alone today.   Allergies:  Allergies  Allergen Reactions  . Atorvastatin Other (See Comments)    myalgias  . Pravastatin Other (See Comments)    Muscle pain    Current Medications: Current Outpatient Medications  Medication Sig Dispense Refill  . Accu-Chek FastClix Lancets MISC USE TO TEST BLOOD SUGAR TWICE DAILY DX E11.40 102 each 11  . acetaminophen (TYLENOL) 500 MG tablet Take 500 mg by mouth every 6 (six) hours as needed.    . Alcohol Swabs (B-D SINGLE USE SWABS REGULAR) PADS Use to test blood sugar once daily dx: 250.00 100 each 3  . B-D TB SYRINGE 1CC/25GX5/8" 25G X 5/8" 1 ML MISC     . Blood Glucose Calibration (ACCU-CHEK AVIVA) SOLN Use as directed 1 each 3  . Cholecalciferol (VITAMIN D) 1000 UNITS capsule Take 1,000 Units by mouth daily.    . folic acid (FOLVITE) 1 MG tablet Take 1 mg by mouth daily.    Marland Kitchen gabapentin (NEURONTIN) 300 MG capsule Take 300  mg by mouth 2 (two) times daily.    Marland Kitchen glucose blood (ACCU-CHEK SMARTVIEW) test strip USE AS DIRECTED TO CHECK BLOOD SUGAR TWICE DAILY 100 each 6  . insulin glargine (LANTUS) 100 UNIT/ML injection Inject 0.2 mLs (20 Units total) into the skin daily. 1 mL 0  . lisinopril (ZESTRIL) 10 MG tablet Take 1 tablet (10 mg total) by mouth daily. 90 tablet 3  . metFORMIN (GLUCOPHAGE) 1000 MG tablet TAKE 1 TABLET BY MOUTH TWICE A DAY 180 tablet 3  . methotrexate 250 MG/10ML injection Inject into the skin.     . Multiple Vitamin (MULTIVITAMIN) tablet Take 1 tablet by mouth daily.    . predniSONE (DELTASONE) 10 MG tablet Take 5 mg  by mouth daily.     No current facility-administered medications for this visit.    Review of Systems  Constitutional: Positive for malaise/fatigue. Negative for chills, fever and weight loss.  HENT: Negative for congestion, ear pain and tinnitus.   Eyes: Negative.  Negative for blurred vision and double vision.  Respiratory: Negative.  Negative for cough, sputum production and shortness of breath.   Cardiovascular: Negative.  Negative for chest pain, palpitations and leg swelling.  Gastrointestinal: Negative.  Negative for abdominal pain, constipation, diarrhea, nausea and vomiting.  Genitourinary: Negative for dysuria, frequency and urgency.  Musculoskeletal: Negative for back pain and falls.  Skin: Negative.  Negative for rash.  Neurological: Negative.  Negative for weakness and headaches.  Endo/Heme/Allergies: Negative.  Does not bruise/bleed easily.  Psychiatric/Behavioral: Negative.  Negative for depression. The patient is not nervous/anxious and does not have insomnia.    Performance status (ECOG): 1  Vitals There were no vitals taken for this visit.   Physical Exam Vitals and nursing note reviewed.  Constitutional:      General: She is not in acute distress.    Appearance: Normal appearance. She is well-developed. She is not ill-appearing or diaphoretic.  HENT:     Head: Normocephalic and atraumatic.     Comments: Short dark hair.    Mouth/Throat:     Pharynx: No oropharyngeal exudate or posterior oropharyngeal erythema.  Eyes:     General: No scleral icterus.    Conjunctiva/sclera: Conjunctivae normal.     Pupils: Pupils are equal, round, and reactive to light.     Comments: Brown eyes.  Cardiovascular:     Rate and Rhythm: Normal rate and regular rhythm.     Heart sounds: Normal heart sounds. No murmur heard.   Pulmonary:     Effort: Pulmonary effort is normal. No respiratory distress.     Breath sounds: Normal breath sounds. No wheezing or rales.  Chest:      Chest wall: No tenderness.  Abdominal:     General: Bowel sounds are normal. There is no distension.     Palpations: Abdomen is soft. There is no mass.     Tenderness: There is no abdominal tenderness. There is no guarding or rebound.  Musculoskeletal:        General: No swelling or tenderness. Normal range of motion.     Cervical back: Normal range of motion and neck supple.  Lymphadenopathy:     Cervical: No cervical adenopathy.  Skin:    General: Skin is warm and dry.     Coloration: Skin is not jaundiced or pale.  Neurological:     Mental Status: She is alert and oriented to person, place, and time.     Sensory: No sensory deficit.  Motor: No weakness.     Comments: Lower extremity strength and sensation are symmetric and normal. Normal patellar reflexes.  Psychiatric:        Behavior: Behavior normal.        Thought Content: Thought content normal.        Judgment: Judgment normal.    No visits with results within 3 Day(s) from this visit.  Latest known visit with results is:  Infusion on 03/14/2021  Component Date Value Ref Range Status  . Specimen Description 03/14/2021    Final                   Value:URINE, RANDOM Performed at [facility], [address] [place], [place] [address]   . Special Requests 03/14/2021    Final                   Value:NONE Performed at [facility], [address] [place], [place] [address]   . Culture 03/14/2021    Final                   Value:NO GROWTH Performed at [facility], [place] [address] [place], [place] [address]   . Report Status 03/14/2021 03/15/2021 FINAL   Final  . Color, Urine 03/14/2021 YELLOW  YELLOW Final  . APPearance 03/14/2021 CLEAR  CLEAR Final  . Specific Gravity, Urine 03/14/2021 1.025  1.005 - 1.030 Final  . pH 03/14/2021 5.0  5.0 - 8.0 Final  . Glucose, UA 03/14/2021 NEGATIVE  NEGATIVE mg/dL Final  . Hgb urine dipstick 03/14/2021 NEGATIVE  NEGATIVE Final  . Bilirubin  Urine 03/14/2021 NEGATIVE  NEGATIVE Final  . Ketones, ur 03/14/2021 NEGATIVE  NEGATIVE mg/dL Final  . Protein, ur 03/14/2021 NEGATIVE  NEGATIVE mg/dL Final  . Nitrite 03/14/2021 NEGATIVE  NEGATIVE Final  . Chalmers Guest 03/14/2021 NEGATIVE  NEGATIVE Final  . Squamous Epithelial / LPF 03/14/2021 0-5  0 - 5 Final  . WBC, UA 03/14/2021 0-5  0 - 5 WBC/hpf Final  . RBC / HPF 03/14/2021 0-5  0 - 5 RBC/hpf Final  . Bacteria, UA 03/14/2021 FEW* NONE SEEN Final  . Mucus 03/14/2021 PRESENT   Final  . Edwina Barth 03/14/2021 LESS THAN 10 mL OF URINE SUBMITTED   Final   Performed at [facility], [address] [place], [place] [address]    Assessment:  ELLAINA SCHULER is a 75 y.o. 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 She receives IVIG monthly,methotrexate weekly, and prednisone5mg  a day.  CKhas been followed (38-234): >5555 on 05/30/2017, 4409 on 06/20/2017, 3546 on 07/05/2017, 2725 on 07/30/2017, 3675 on 08/28/2017, 2405 on 10/08/2017, 2743 on 11/08/2017, 2723 on 12/20/2017, 723 on 01/21/2018, 507 on 02/21/2018, 2192 on 04/11/2018, 2227 on 06/04/2018, 4468 on 07/22/2018, 2204 on 09/06/2018, 1360 on 12/02/2018, 1423 on 01/01/2019,912 on 03/03/2019, 3955 on 04/21/2019,4612 on 05/06/2019,4560 on 05/20/2019, 4532 on 06/16/2019,4935 on 07/10/2019,4330 on 07/28/2019, 3645 on 08/13/2019, 2514 on 08/25/2019, 807 on 09/22/2019, 251 on 10/21/2019, 67 on 11/25/2019, 66 on 12/22/2019, < 5 on 01/19/2020, 67 on 02/02/2020, 254 on 03/15/2020, 338 on 03/29/2020, 847 on 05/25/2020, 854 on 07/05/2020, 702 on 08/16/2020, 811 on 09/27/2020, 304 on 11/08/2020, 138 on 12/20/2020, 110 on 01/31/2021, and 78 on 03/14/2021.  Aldolasehas been followed (3.3-10.3): 67.2 in06/05/2017, 69.9 on06/27/2018, 64.8 on07/11/2017,57.6 on08/05/2017, 60.3 on09/03/2017, 38.7 on 12/20/2017, 9.3 on01/28/2019, 7.4 on02/28/2019, 29.1 on04/18/2019, to51.9 on07/29/2019, 23.7 on09/13/2019, 16.4 on 12/02/2018,14.1 on  01/01/2019,8.8 on 03/03/2019, 53.4 on 05/06/2019,42.9 on 05/20/2019,  39.0 in 06/16/2019, 69.3 on 07/28/2019, 37.2 on 08/25/2019, 12.5 on 09/22/2019, 7.2 on 10/21/2019, 5.2 on 11/25/2019, 4.8 on 12/22/2019, 3.9 on 01/19/2020, 4.2 on 02/02/2020, 5.5 on 03/15/2020, 5.6 on 03/29/2020, 9.1 on 05/25/2020, 11.6 on 07/05/2020, 11.4 on 08/16/2020, 10.8 on 09/27/2020, 5.2 on 11/08/2020, 4.6 on 12/20/2020, 4.1 on 01/31/2021, and 3.6 on 03/14/2021.  She received IVIG (Privigen) 75 gm x 2 days every 4 weeks beginning 12/07/2017.IVIG was decreased to every 8 weeks on 04/22/2019, butthen returned to monthly secondary to rise CK.IVIG wasincreased to 2 gm/kg on08/31/2020,09/23/2019, 10/21/2019, 11/25/2019, 02/02/2020, 03/29/2020, 05/25/2020, 07/05/2020, 08/16/2020, 09/27/2020, 11/08/2020, and 12/20/2020.  She was switched to every 8 weeks then every 6 weeks.  She has a normocytic anemia. Baseline hemoglobin is 10. Anemia work-upon 12/22/2019 revealed a hematocritwas32.4, hemoglobin 10.0, MCV 92.8, platelets 258,000, WBC 6200, ANC 3700. Reticwas 3.8%.[id number].Coombs was negative. Peripheralsmearrevealed anormocytic anemia. The morphologyof the RBCs, WBCs and platelets were within normal limits.  Last colonoscopy on 03/07/2016 revealed grade I hemorrhoids.She has B12 deficiencyand is on oral B12. B12 was 306 and folate >22.3 on 05/30/2017.   She has chronicmild elevation in LFTswhich have correlated with increased CPK.AST/ALThave been followed:53/54 on 02/17/2019, 107/128 on04/27/2020, 134/200 on05/26/2020,138/261 on 07/10/2019,151/304 on08/02/2019, 175/289 on 08/13/2019, 102/196 on 08/25/2019, 47/75 on 09/22/2019, 12/23 on 11/25/2019, 19/16 on 12/22/2019, 20/16 on 01/19/2020, 19/15 on 02/02/2020, 22/17 on 03/15/2020, 25/19 on 03/29/2020, 40/36 on 05/25/2020, 42/46 on 07/05/2020, 37/48 on 08/16/2020, 36/44 on 09/27/2020, 24/23 on 11/08/2020, 21/16 on 12/20/2020, 19/15 on  01/31/2021 and 20/14 on 03/14/2021. Hepatitis B and C testing werenegative on 06/27/2018and 07/28/2019.  Symptomatically, she feels "pretty good." She did well with decreased steroid premeds. She has also had intermittent right-sided pain.  She has nocturia.  Exam is stable.  Plan: 1.   Labs today: CBC with diff, CMP, CK, aldolase. 2.Polymyositis Clinically, she is doing well on IVIG 2 g/kg every 6 weeks. She is on prednisone 5 mg a day and MTX10mg SQweekly. CK is 106 (normal).  Aldolase is 3.6 (normal). Continue IVIG every 6 weeks.  Continue to decrease steroid premedications.  Labs reviewed.  Begin IVIG x4 days beginning today. 3.Elevated LFTs, resolved AST 25and ALT 14.  Increase in LFTs correlates with increased CPK (now normal). CPK reflects activity of polymyositis. LFTs with each cycle. 4.Normocytic anemia She has a stable mild normocytic anemia.  Continue to monitor.  Hemoglobin is 10.1. 5.   Urinary frequency  Negative urinalysis and urine culture on 03/14/21. 6.  Weight gain  Secondary to steroids.  Recommend staying active and drinking plenty of fluids.  Balanced diet educational materials provided to patient.  Disposition: Proceed with next cycle of IVIG over the next 4 days. RTC in 6 weeks for MD assessment, labs (CBC with diff, CMP, CK, aldolase) and IVIG over 4 days.  I discussed the assessment and treatment plan with the patient.  The patient was provided an opportunity to ask questions and all were answered.  The patient agreed with the plan and demonstrated an understanding of the instructions.  The patient was advised to call back if the symptoms worsen or if the condition fails to improve as anticipated.  Greater than 50% was spent in counseling and coordination of care with this patient including but not limited to  discussion of the relevant topics above (See A&P) including, but not limited to diagnosis and management of acute and chronic medical conditions.   Bridget Casa, NP 04/25/2021 8:02 AM

## 2021-04-26 ENCOUNTER — Inpatient Hospital Stay: Payer: Medicare PPO

## 2021-04-26 VITALS — BP 179/81 | HR 73 | Temp 97.8°F | Resp 18

## 2021-04-26 DIAGNOSIS — M3320 Polymyositis, organ involvement unspecified: Secondary | ICD-10-CM | POA: Diagnosis not present

## 2021-04-26 DIAGNOSIS — Z79899 Other long term (current) drug therapy: Secondary | ICD-10-CM | POA: Diagnosis not present

## 2021-04-26 LAB — KAPPA/LAMBDA LIGHT CHAINS
Kappa free light chain: 27.6 mg/L — ABNORMAL HIGH (ref 3.3–19.4)
Kappa, lambda light chain ratio: 1.51 (ref 0.26–1.65)
Lambda free light chains: 18.3 mg/L (ref 5.7–26.3)

## 2021-04-26 LAB — ALDOLASE: Aldolase: 4.8 U/L (ref 3.3–10.3)

## 2021-04-26 MED ORDER — METHYLPREDNISOLONE SODIUM SUCC 125 MG IJ SOLR
10.0000 mg | INTRAMUSCULAR | Status: DC
  Administered 2021-04-26: 10 mg via INTRAVENOUS
  Filled 2021-04-26: qty 2

## 2021-04-26 MED ORDER — IMMUNE GLOBULIN (HUMAN) 20 GM/200ML IV SOLN
40.0000 g | INTRAVENOUS | Status: DC
  Administered 2021-04-26: 40 g via INTRAVENOUS
  Filled 2021-04-26: qty 400

## 2021-04-26 MED ORDER — DIPHENHYDRAMINE HCL 25 MG PO TABS
25.0000 mg | ORAL_TABLET | Freq: Once | ORAL | Status: AC
  Administered 2021-04-26: 25 mg via ORAL
  Filled 2021-04-26: qty 1

## 2021-04-26 MED ORDER — ACETAMINOPHEN 325 MG PO TABS
650.0000 mg | ORAL_TABLET | Freq: Once | ORAL | Status: AC
Start: 2021-04-26 — End: 2021-04-26
  Administered 2021-04-26: 650 mg via ORAL
  Filled 2021-04-26: qty 2

## 2021-04-26 MED ORDER — DEXTROSE 5 % IV SOLN
INTRAVENOUS | Status: DC
  Filled 2021-04-26: qty 250

## 2021-04-26 NOTE — Patient Instructions (Signed)
[facility]  Discharge Instructions: Thank you for choosing [facility] to provide your oncology and hematology care.  If you have a lab appointment with the Cancer Center, please go directly to the Cancer Center and check in at the registration area.  Wear comfortable clothing and clothing appropriate for easy access to any Portacath or PICC line.   We strive to give you quality time with your provider. You may need to reschedule your appointment if you arrive late (15 or more minutes).  Arriving late affects you and other patients whose appointments are after yours.  Also, if you miss three or more appointments without notifying the office, you may be dismissed from the clinic at the provider's discretion.      For prescription refill requests, have your pharmacy contact our office and allow 72 hours for refills to be completed.        To help prevent nausea and vomiting after your treatment, we encourage you to take your nausea medication as directed.  BELOW ARE SYMPTOMS THAT SHOULD BE REPORTED IMMEDIATELY: *FEVER GREATER THAN 100.4 F (38 C) OR HIGHER *CHILLS OR SWEATING *NAUSEA AND VOMITING THAT IS NOT CONTROLLED WITH YOUR NAUSEA MEDICATION *UNUSUAL SHORTNESS OF BREATH *UNUSUAL BRUISING OR BLEEDING *URINARY PROBLEMS (pain or burning when urinating, or frequent urination) *BOWEL PROBLEMS (unusual diarrhea, constipation, pain near the anus) TENDERNESS IN MOUTH AND THROAT WITH OR WITHOUT PRESENCE OF ULCERS (sore throat, sores in mouth, or a toothache) UNUSUAL RASH, SWELLING OR PAIN  UNUSUAL VAGINAL DISCHARGE OR ITCHING   Items with * indicate a potential emergency and should be followed up as soon as possible or go to the Emergency Department if any problems should occur.  Please show the CHEMOTHERAPY ALERT CARD or IMMUNOTHERAPY ALERT CARD at check-in to the Emergency Department and triage nurse.  Should you have questions after your visit or  need to cancel or reschedule your appointment, please contact [facility]  [phone] and follow the prompts.  Office hours are 8:00 a.m. to 4:30 p.m. Monday - Friday. Please note that voicemails left after 4:00 p.m. may not be returned until the following business day.  We are closed weekends and major holidays. You have access to a nurse at all times for urgent questions. Please call the main number to the clinic [phone] and follow the prompts.  For any non-urgent questions, you may also contact your provider using MyChart. We now offer e-Visits for anyone 18 and older to request care online for non-urgent symptoms. For details visit [email or web address].   Also download the MyChart app! Go to the app store, search "MyChart", open the app, select [facility], and log in with your MyChart username and password.  Due to Covid, a mask is required upon entering the hospital/clinic. If you do not have a mask, one will be given to you upon arrival. For doctor visits, patients may have 1 support person aged 18 or older with them. For treatment visits, patients cannot have anyone with them due to current Covid guidelines and our immunocompromised population.  

## 2021-04-27 ENCOUNTER — Inpatient Hospital Stay: Payer: Medicare PPO

## 2021-04-27 ENCOUNTER — Other Ambulatory Visit: Payer: Self-pay

## 2021-04-27 VITALS — BP 180/79 | HR 83 | Temp 97.0°F | Resp 18

## 2021-04-27 DIAGNOSIS — M3320 Polymyositis, organ involvement unspecified: Secondary | ICD-10-CM

## 2021-04-27 DIAGNOSIS — Z79899 Other long term (current) drug therapy: Secondary | ICD-10-CM | POA: Diagnosis not present

## 2021-04-27 LAB — PROTEIN ELECTROPHORESIS, SERUM
A/G Ratio: 1.2 (ref 0.7–1.7)
Albumin ELP: 3.4 g/dL (ref 2.9–4.4)
Alpha-1-Globulin: 0.2 g/dL (ref 0.0–0.4)
Alpha-2-Globulin: 0.6 g/dL (ref 0.4–1.0)
Beta Globulin: 0.9 g/dL (ref 0.7–1.3)
Gamma Globulin: 1.2 g/dL (ref 0.4–1.8)
Globulin, Total: 2.9 g/dL (ref 2.2–3.9)
Total Protein ELP: 6.3 g/dL (ref 6.0–8.5)

## 2021-04-27 MED ORDER — DEXTROSE 5 % IV SOLN
INTRAVENOUS | Status: DC
  Filled 2021-04-27: qty 250

## 2021-04-27 MED ORDER — DIPHENHYDRAMINE HCL 25 MG PO TABS
25.0000 mg | ORAL_TABLET | Freq: Once | ORAL | Status: AC
  Administered 2021-04-27: 25 mg via ORAL
  Filled 2021-04-27: qty 1

## 2021-04-27 MED ORDER — IMMUNE GLOBULIN (HUMAN) 20 GM/200ML IV SOLN
40.0000 g | INTRAVENOUS | Status: DC
  Administered 2021-04-27: 40 g via INTRAVENOUS
  Filled 2021-04-27: qty 400

## 2021-04-27 MED ORDER — DIPHENHYDRAMINE HCL 25 MG PO CAPS
ORAL_CAPSULE | ORAL | Status: AC
  Filled 2021-04-27: qty 1

## 2021-04-27 MED ORDER — METHYLPREDNISOLONE SODIUM SUCC 125 MG IJ SOLR
10.0000 mg | INTRAMUSCULAR | Status: DC
  Administered 2021-04-27: 10 mg via INTRAVENOUS
  Filled 2021-04-27: qty 2

## 2021-04-27 MED ORDER — ACETAMINOPHEN 325 MG PO TABS
650.0000 mg | ORAL_TABLET | Freq: Once | ORAL | Status: AC
  Administered 2021-04-27: 650 mg via ORAL
  Filled 2021-04-27: qty 2

## 2021-04-28 ENCOUNTER — Telehealth: Payer: Self-pay

## 2021-04-28 ENCOUNTER — Inpatient Hospital Stay: Payer: Medicare PPO

## 2021-04-28 VITALS — BP 177/83 | HR 79 | Temp 96.9°F | Resp 18

## 2021-04-28 DIAGNOSIS — Z79899 Other long term (current) drug therapy: Secondary | ICD-10-CM | POA: Diagnosis not present

## 2021-04-28 DIAGNOSIS — M3320 Polymyositis, organ involvement unspecified: Secondary | ICD-10-CM | POA: Diagnosis not present

## 2021-04-28 MED ORDER — ACETAMINOPHEN 325 MG PO TABS
650.0000 mg | ORAL_TABLET | Freq: Once | ORAL | Status: AC
  Administered 2021-04-28: 650 mg via ORAL
  Filled 2021-04-28: qty 2

## 2021-04-28 MED ORDER — DIPHENHYDRAMINE HCL 25 MG PO CAPS
ORAL_CAPSULE | ORAL | Status: AC
  Filled 2021-04-28: qty 1

## 2021-04-28 MED ORDER — IMMUNE GLOBULIN (HUMAN) 20 GM/200ML IV SOLN
40.0000 g | INTRAVENOUS | Status: DC
  Administered 2021-04-28: 40 g via INTRAVENOUS
  Filled 2021-04-28: qty 400

## 2021-04-28 MED ORDER — METHYLPREDNISOLONE SODIUM SUCC 125 MG IJ SOLR
10.0000 mg | INTRAMUSCULAR | Status: DC
  Administered 2021-04-28: 10 mg via INTRAVENOUS
  Filled 2021-04-28: qty 2

## 2021-04-28 MED ORDER — DEXTROSE 5 % IV SOLN
INTRAVENOUS | Status: DC
  Filled 2021-04-28: qty 250

## 2021-04-28 MED ORDER — DIPHENHYDRAMINE HCL 25 MG PO TABS
25.0000 mg | ORAL_TABLET | Freq: Once | ORAL | Status: AC
  Administered 2021-04-28: 25 mg via ORAL
  Filled 2021-04-28: qty 1

## 2021-04-28 NOTE — Progress Notes (Signed)
Bridget Romero, all of her labs returned this morning for multiple myeloma they all look very stable.  Her magnesium was a little low.  Would you mind letting her know she can try OTC magnesium supplements (200-452m daily) as she can tolerate. We will recheck at her next visit.  If she cannot tolerate, tell her to let uKoreaknow.  JFaythe Casa NP 04/28/2021 10:37 AM

## 2021-04-28 NOTE — Telephone Encounter (Signed)
Per Bridget Romero, pt is to begin OTC magnesium (200-400mg ) as she can tolerate. If she can not tolerate she is to call clinic and let us know. If pt is able to tolerate mag, levels will be rechecked at next appt on 6/14. Pt understood and will purchase magnesium.

## 2021-05-03 DIAGNOSIS — R21 Rash and other nonspecific skin eruption: Secondary | ICD-10-CM | POA: Diagnosis not present

## 2021-05-03 DIAGNOSIS — M3322 Polymyositis with myopathy: Secondary | ICD-10-CM | POA: Diagnosis not present

## 2021-05-03 DIAGNOSIS — E119 Type 2 diabetes mellitus without complications: Secondary | ICD-10-CM | POA: Diagnosis not present

## 2021-05-03 DIAGNOSIS — G629 Polyneuropathy, unspecified: Secondary | ICD-10-CM | POA: Diagnosis not present

## 2021-05-05 ENCOUNTER — Telehealth: Payer: Self-pay

## 2021-05-05 NOTE — Telephone Encounter (Signed)
Landon with [facility] said she will fax over to Shannon's attention at [phone] what is needed by ins co. Harmon Pier said ins is requiring a hgba1c in last 90 days since pt is taking insulin and metformin. I did not find an A1C within the last 90 days and Landon wanted to request pt to have A1C. Pt already scheduled for 6 mth FU appt with DR Silvio Pate on 05/11/21 at 9:15. Can pt have A1C at the 05/11/21 visit.

## 2021-05-05 NOTE — Telephone Encounter (Signed)
They won't fill insulin or metformin????  Yes, we should just be able to do the A1c at the appt next week

## 2021-05-06 NOTE — Telephone Encounter (Signed)
Spoke to pharmacy. Gave them reading from 11-10-20. She will get another 05-11-21.

## 2021-05-11 ENCOUNTER — Other Ambulatory Visit: Payer: Self-pay

## 2021-05-11 ENCOUNTER — Ambulatory Visit: Payer: Medicare PPO | Admitting: Internal Medicine

## 2021-05-11 ENCOUNTER — Encounter: Payer: Self-pay | Admitting: Internal Medicine

## 2021-05-11 VITALS — BP 126/80 | HR 80 | Temp 97.0°F | Ht 66.0 in | Wt 198.0 lb

## 2021-05-11 DIAGNOSIS — Z794 Long term (current) use of insulin: Secondary | ICD-10-CM

## 2021-05-11 DIAGNOSIS — M3320 Polymyositis, organ involvement unspecified: Secondary | ICD-10-CM

## 2021-05-11 DIAGNOSIS — E1140 Type 2 diabetes mellitus with diabetic neuropathy, unspecified: Secondary | ICD-10-CM | POA: Diagnosis not present

## 2021-05-11 DIAGNOSIS — I10 Essential (primary) hypertension: Secondary | ICD-10-CM | POA: Diagnosis not present

## 2021-05-11 LAB — POCT GLYCOSYLATED HEMOGLOBIN (HGB A1C): Hemoglobin A1C: 6.9 % — AB (ref 4.0–5.6)

## 2021-05-11 NOTE — Assessment & Plan Note (Signed)
Lab Results  Component Value Date   HGBA1C 6.9 (A) 05/11/2021   Much better Continues on the lantus and metformin

## 2021-05-11 NOTE — Assessment & Plan Note (Signed)
BP Readings from Last 3 Encounters:  05/11/21 126/80  04/28/21 (!) 177/83  04/27/21 (!) 180/79   Good control on the lisinopril

## 2021-05-11 NOTE — Assessment & Plan Note (Signed)
Seems to be in remission on the MTX, IVIG and low dose prednisone

## 2021-05-11 NOTE — Progress Notes (Signed)
Subjective:    Patient ID: Bridget Romero, female    DOB: 02-02-1946, 75 y.o.   MRN: [id number]  HPI Here for follow up of diabetes This visit occurred during the SARS-CoV-2 public health emergency.  Safety protocols were in place, including screening questions prior to the visit, additional usage of staff PPE, and extensive cleaning of exam room while observing appropriate contact time as indicated for disinfecting solutions.   Myalgia is under control IV IgG every 6-8 weeks Prednisone down to 5mg  Still on MTX  Taking 10-20 units of lantus depending on her AM sugars (20 if over 130 in the morning) Mostly been under 130 fasting Still with occasional foot tingling --some soreness up towards ankle  Current Outpatient Medications on File Prior to Visit  Medication Sig Dispense Refill  . Accu-Chek FastClix Lancets MISC USE TO TEST BLOOD SUGAR TWICE DAILY DX E11.40 102 each 11  . acetaminophen (TYLENOL) 500 MG tablet Take 500 mg by mouth every 6 (six) hours as needed.    . Alcohol Swabs (B-D SINGLE USE SWABS REGULAR) PADS Use to test blood sugar once daily dx: 250.00 100 each 3  . B-D TB SYRINGE 1CC/25GX5/8" 25G X 5/8" 1 ML MISC     . Blood Glucose Calibration (ACCU-CHEK AVIVA) SOLN Use as directed 1 each 3  . Cholecalciferol (VITAMIN D) 1000 UNITS capsule Take 1,000 Units by mouth daily.    . folic acid (FOLVITE) 1 MG tablet Take 1 mg by mouth daily.    Marland Kitchen gabapentin (NEURONTIN) 300 MG capsule Take 300 mg by mouth 2 (two) times daily.    Marland Kitchen glucose blood (ACCU-CHEK SMARTVIEW) test strip USE AS DIRECTED TO CHECK BLOOD SUGAR TWICE DAILY 100 each 6  . insulin glargine (LANTUS) 100 UNIT/ML injection Inject 0.2 mLs (20 Units total) into the skin daily. (Patient taking differently: Inject 20 Units into the skin daily. 10-20 units) 1 mL 0  . lisinopril (ZESTRIL) 10 MG tablet Take 1 tablet (10 mg total) by mouth daily. 90 tablet 3  . metFORMIN (GLUCOPHAGE) 1000 MG tablet TAKE 1 TABLET BY MOUTH  TWICE A DAY 180 tablet 3  . methotrexate 250 MG/10ML injection Inject into the skin.     . Multiple Vitamin (MULTIVITAMIN) tablet Take 1 tablet by mouth daily.    Marland Kitchen nystatin cream (MYCOSTATIN) Apply topically 2 (two) times daily.    . predniSONE (DELTASONE) 10 MG tablet Take 5 mg by mouth daily.     No current facility-administered medications on file prior to visit.    Allergies  Allergen Reactions  . Atorvastatin Other (See Comments)    myalgias  . Pravastatin Other (See Comments)    Muscle pain    Past Medical History:  Diagnosis Date  . Allergy   . Asthma   . Diabetes mellitus   . Hyperlipidemia   . Hypertension   . Neuromuscular disorder (HCC)    polymyositis  . Osteoarthritis   . Vitamin B12 deficiency     Past Surgical History:  Procedure Laterality Date  . COLONOSCOPY WITH PROPOFOL N/A 03/07/2016   Procedure: COLONOSCOPY WITH PROPOFOL;  Surgeon: Lollie Sails, MD;  [facility];  Service: Endoscopy;  Laterality: N/A;    Family History  Problem Relation Age of Onset  . Heart disease Mother   . Cancer Father   . Breast cancer Neg Hx     Social History   Socioeconomic History  . Marital status: Widowed    Spouse name: Not on  file  . Number of children: 2  . Years of education: Not on file  . Highest education level: Not on file  Occupational History  . Occupation: Retired as Training and development officer at [employer]:    Tobacco Use  . Smoking status: Former Research scientist (life sciences)  . Smokeless tobacco: Never Used  . Tobacco comment: age 60 when stopped smoking  Vaping Use  . Vaping Use: Never used  Substance and Sexual Activity  . Alcohol use: No  . Drug use: No  . Sexual activity: Not on file  Other Topics Concern  . Not on file  Social History Narrative   has living will   Son and daughter should make health care decisions for her   Would accept resuscitation   Not sure about tube feeds   Social Determinants of Health   Financial  Resource Strain: Not on file  Food Insecurity: Not on file  Transportation Needs: Not on file  Physical Activity: Not on file  Stress: Not on file  Social Connections: Not on file  Intimate Partner Violence: Not on file   Review of Systems  Weight is down a few pounds  Appetite is fine Sleeps well No chest pain No sob     Objective:   Physical Exam Constitutional:      Appearance: Normal appearance.  Cardiovascular:     Rate and Rhythm: Normal rate and regular rhythm.     Pulses: Normal pulses.     Heart sounds: No murmur heard. No gallop.   Pulmonary:     Effort: Pulmonary effort is normal.     Breath sounds: Normal breath sounds. No wheezing or rales.  Musculoskeletal:     Cervical back: Neck supple.     Right lower leg: No edema.     Left lower leg: No edema.  Lymphadenopathy:     Cervical: No cervical adenopathy.  Skin:    General: Skin is warm.     Findings: No rash.  Neurological:     Mental Status: She is alert.  Psychiatric:        Mood and Affect: Mood normal.        Behavior: Behavior normal.            Assessment & Plan:

## 2021-05-16 ENCOUNTER — Telehealth: Payer: Self-pay | Admitting: Internal Medicine

## 2021-05-16 NOTE — Telephone Encounter (Signed)
RETURNING PHONE [facility]

## 2021-05-17 ENCOUNTER — Other Ambulatory Visit: Payer: Self-pay | Admitting: Internal Medicine

## 2021-05-17 NOTE — Telephone Encounter (Signed)
Spoke with patient. I do not see anything in the chart stating Larene Beach called but patient states she had a message from her on [place] 05/13/21. Advised patient that I will leave this message for [facility] when she comes back to review and to call patient back if she does have some information for her.

## 2021-05-19 NOTE — Telephone Encounter (Signed)
Called pt back as I am not sure that I called. She said she had a missed call on her phone from [facility] and thought I may have called. No message was left. She thought maybe I called her since she had an OV last week.

## 2021-05-20 ENCOUNTER — Encounter: Payer: Self-pay | Admitting: Oncology

## 2021-06-07 ENCOUNTER — Inpatient Hospital Stay: Payer: Medicare PPO | Attending: Oncology

## 2021-06-07 ENCOUNTER — Inpatient Hospital Stay: Payer: Medicare PPO

## 2021-06-07 ENCOUNTER — Encounter: Payer: Self-pay | Admitting: Oncology

## 2021-06-07 ENCOUNTER — Inpatient Hospital Stay (HOSPITAL_BASED_OUTPATIENT_CLINIC_OR_DEPARTMENT_OTHER): Payer: Medicare PPO | Admitting: Oncology

## 2021-06-07 ENCOUNTER — Other Ambulatory Visit: Payer: Self-pay

## 2021-06-07 VITALS — BP 155/74 | HR 73 | Temp 96.4°F | Resp 17

## 2021-06-07 VITALS — BP 128/69 | HR 73 | Temp 96.5°F | Resp 16 | Wt 200.6 lb

## 2021-06-07 DIAGNOSIS — M3320 Polymyositis, organ involvement unspecified: Secondary | ICD-10-CM | POA: Diagnosis not present

## 2021-06-07 DIAGNOSIS — Z79899 Other long term (current) drug therapy: Secondary | ICD-10-CM | POA: Insufficient documentation

## 2021-06-07 DIAGNOSIS — D649 Anemia, unspecified: Secondary | ICD-10-CM

## 2021-06-07 LAB — CBC WITH DIFFERENTIAL/PLATELET
Abs Immature Granulocytes: 0.02 10*3/uL (ref 0.00–0.07)
Basophils Absolute: 0 10*3/uL (ref 0.0–0.1)
Basophils Relative: 1 %
Eosinophils Absolute: 0.1 10*3/uL (ref 0.0–0.5)
Eosinophils Relative: 2 %
HCT: 31.5 % — ABNORMAL LOW (ref 36.0–46.0)
Hemoglobin: 10.2 g/dL — ABNORMAL LOW (ref 12.0–15.0)
Immature Granulocytes: 0 %
Lymphocytes Relative: 35 %
Lymphs Abs: 1.8 10*3/uL (ref 0.7–4.0)
MCH: 28.8 pg (ref 26.0–34.0)
MCHC: 32.4 g/dL (ref 30.0–36.0)
MCV: 89 fL (ref 80.0–100.0)
Monocytes Absolute: 0.3 10*3/uL (ref 0.1–1.0)
Monocytes Relative: 5 %
Neutro Abs: 3 10*3/uL (ref 1.7–7.7)
Neutrophils Relative %: 57 %
Platelets: 277 10*3/uL (ref 150–400)
RBC: 3.54 MIL/uL — ABNORMAL LOW (ref 3.87–5.11)
RDW: 14.7 % (ref 11.5–15.5)
WBC: 5.2 10*3/uL (ref 4.0–10.5)
nRBC: 0 % (ref 0.0–0.2)

## 2021-06-07 LAB — COMPREHENSIVE METABOLIC PANEL
ALT: 12 U/L (ref 0–44)
AST: 22 U/L (ref 15–41)
Albumin: 3.6 g/dL (ref 3.5–5.0)
Alkaline Phosphatase: 43 U/L (ref 38–126)
Anion gap: 6 (ref 5–15)
BUN: 22 mg/dL (ref 8–23)
CO2: 26 mmol/L (ref 22–32)
Calcium: 9.4 mg/dL (ref 8.9–10.3)
Chloride: 106 mmol/L (ref 98–111)
Creatinine, Ser: 0.79 mg/dL (ref 0.44–1.00)
GFR, Estimated: >60 mL/min (ref >60)
Glucose, Bld: 149 mg/dL — ABNORMAL HIGH (ref 70–99)
Potassium: 3.7 mmol/L (ref 3.5–5.1)
Sodium: 138 mmol/L (ref 135–145)
Total Bilirubin: 0.9 mg/dL (ref 0.3–1.2)
Total Protein: 7.1 g/dL (ref 6.5–8.1)

## 2021-06-07 LAB — CK: Total CK: 69 U/L (ref 38–234)

## 2021-06-07 MED ORDER — ACETAMINOPHEN 325 MG PO TABS
650.0000 mg | ORAL_TABLET | Freq: Once | ORAL | Status: AC
Start: 2021-06-07 — End: 2021-06-07
  Administered 2021-06-07: 650 mg via ORAL
  Filled 2021-06-07: qty 2

## 2021-06-07 MED ORDER — DEXTROSE 5 % IV SOLN
Freq: Once | INTRAVENOUS | Status: AC
  Filled 2021-06-07: qty 250

## 2021-06-07 MED ORDER — DIPHENHYDRAMINE HCL 25 MG PO TABS
25.0000 mg | ORAL_TABLET | Freq: Once | ORAL | Status: AC
  Administered 2021-06-07: 25 mg via ORAL
  Filled 2021-06-07: qty 1

## 2021-06-07 MED ORDER — DICLOFENAC SODIUM 1 % EX GEL
4.0000 g | Freq: Four times a day (QID) | CUTANEOUS | 1 refills | Status: DC
Start: 2021-06-07 — End: 2021-09-20

## 2021-06-07 MED ORDER — IMMUNE GLOBULIN (HUMAN) 20 GM/200ML IV SOLN
40.0000 g | INTRAVENOUS | Status: DC
  Administered 2021-06-07: 40 g via INTRAVENOUS
  Filled 2021-06-07: qty 400

## 2021-06-07 MED ORDER — METHYLPREDNISOLONE SODIUM SUCC 125 MG IJ SOLR
10.0000 mg | INTRAMUSCULAR | Status: DC
  Administered 2021-06-07: 10 mg via INTRAVENOUS
  Filled 2021-06-07: qty 2

## 2021-06-07 NOTE — Progress Notes (Signed)
[facility]  [address], [address] [place], [place] [address] Phone: [phone]  Fax: [phone]   Clinic Day: 06/07/21  Referring physician: [name], MD  Chief Complaint: Bridget Romero is a 75 y.o. 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  HPI: The patient was last seen in the hematology clinic on 04/25/2021.  She received IVIG daily x 4 (04/25/21-04/28/21).   Dr. [name] was contacted regarding the steroids she receives with IVIG. Solu-Medrol was decreased to 20 mg with each infusion.    During the interim, she was seen by Dr. Silvio Pate for chronic comorbidities. No changes to medications were made.   Today she reports feeling well.  Has stable bilateral lower extremity muscle aches and pains.  Denies any acute weakness.  No changes in ADLs.  No increased pain, swelling or stiffness in joints.  No shortness of breath with exertion. No photosensitivity or pruritus.  Denies dysphagia, cough or fever.   Past Medical History:  Diagnosis Date   Allergy    Asthma    Diabetes mellitus    Hyperlipidemia    Hypertension    Neuromuscular disorder ([place])    polymyositis   Osteoarthritis    Vitamin B12 deficiency     Past Surgical History:  Procedure Laterality Date   COLONOSCOPY WITH PROPOFOL N/A 03/07/2016   Procedure: COLONOSCOPY WITH PROPOFOL;  Surgeon: Lollie Sails, MD;  Location: [facility] ENDOSCOPY;  Service: Endoscopy;  Laterality: N/A;    Family History  Problem Relation Age of Onset   Heart disease Mother    Cancer Father    Breast cancer Neg Hx     Social History:  reports that she has quit smoking. She has never used smokeless tobacco. She reports that she does not drink alcohol and does not use drugs. She is a Training and development officer. She lives in [place]. The patient is alone today.   Allergies:  Allergies  Allergen Reactions   Atorvastatin Other (See Comments)    myalgias   Pravastatin Other  (See Comments)    Muscle pain    Current Medications: Current Outpatient Medications  Medication Sig Dispense Refill   Accu-Chek FastClix Lancets MISC USE TO TEST BLOOD SUGAR TWICE DAILY DX E11.40 102 each 11   acetaminophen (TYLENOL) 500 MG tablet Take 500 mg by mouth every 6 (six) hours as needed.     Alcohol Swabs (B-D SINGLE USE SWABS REGULAR) PADS Use to test blood sugar once daily dx: 250.00 100 each 3   B-D TB SYRINGE 1CC/25GX5/8" 25G X 5/8" 1 ML MISC      Blood Glucose Calibration (ACCU-CHEK AVIVA) SOLN Use as directed 1 each 3   Cholecalciferol (VITAMIN D) 1000 UNITS capsule Take 1,000 Units by mouth daily.     folic acid (FOLVITE) 1 MG tablet Take 1 mg by mouth daily.     gabapentin (NEURONTIN) 300 MG capsule Take 300 mg by mouth 2 (two) times daily.     glucose blood (ACCU-CHEK SMARTVIEW) test strip USE AS DIRECTED TO CHECK BLOOD SUGAR TWICE DAILY 100 each 6   LANTUS 100 UNIT/ML injection INJECT 10 TO 20 UNITS INTO THE SKIN DAILY AS DIRECTED 10 mL 1   lisinopril (ZESTRIL) 10 MG tablet Take 1 tablet (10 mg total) by mouth daily. 90 tablet 3   magnesium citrate SOLN Take by mouth.     metFORMIN (GLUCOPHAGE) 1000 MG tablet TAKE 1 TABLET BY MOUTH TWICE A DAY 180 tablet  3   methotrexate 250 MG/10ML injection Inject into the skin.      Multiple Vitamin (MULTIVITAMIN) tablet Take 1 tablet by mouth daily.     predniSONE (DELTASONE) 10 MG tablet Take 5 mg by mouth daily.     nystatin cream (MYCOSTATIN) Apply topically 2 (two) times daily. (Patient not taking: Reported on 06/07/2021)     No current facility-administered medications for this visit.    Review of Systems  Constitutional:  Positive for malaise/fatigue. Negative for chills, fever and weight loss.  HENT:  Negative for congestion, ear pain and tinnitus.   Eyes: Negative.  Negative for blurred vision and double vision.  Respiratory: Negative.  Negative for cough, sputum production and shortness of breath.   Cardiovascular:  Negative.  Negative for chest pain, palpitations and leg swelling.  Gastrointestinal: Negative.  Negative for abdominal pain, constipation, diarrhea, nausea and vomiting.  Genitourinary:  Negative for dysuria, frequency and urgency.  Musculoskeletal:  Positive for joint pain and myalgias. Negative for back pain and falls.  Skin: Negative.  Negative for rash.  Neurological: Negative.  Negative for weakness and headaches.  Endo/Heme/Allergies: Negative.  Does not bruise/bleed easily.  Psychiatric/Behavioral: Negative.  Negative for depression. The patient is not nervous/anxious and does not have insomnia.   Performance status (ECOG): 1  Vitals Blood pressure 128/69, pulse 73, temperature (!) 96.5 F (35.8 C), resp. rate 16, weight 200 lb 9.9 oz (91 kg), SpO2 99 %.   Physical Exam Constitutional:      Appearance: She is well-developed.  HENT:     Head: Normocephalic and atraumatic.  Eyes:     Pupils: Pupils are equal, round, and reactive to light.  Cardiovascular:     Rate and Rhythm: Normal rate and regular rhythm.     Heart sounds: No murmur heard. Pulmonary:     Effort: Pulmonary effort is normal.     Breath sounds: Normal breath sounds. No wheezing.  Abdominal:     General: Bowel sounds are normal. There is no distension.     Palpations: Abdomen is soft. There is no mass.     Tenderness: There is no abdominal tenderness.  Musculoskeletal:        General: Normal range of motion.     Cervical back: Normal range of motion.  Skin:    General: Skin is warm and dry.  Neurological:     Mental Status: She is alert and oriented to person, place, and time.  Psychiatric:        Behavior: Behavior normal.   Appointment on 06/07/2021  Component Date Value Ref Range Status   Total CK 06/07/2021 69  38 - 234 U/L Final   Performed at [facility], [address], [place], [place] [address]   Sodium 06/07/2021 138  135 - 145 mmol/L Final   Potassium 06/07/2021 3.7  3.5 -  5.1 mmol/L Final   Chloride 06/07/2021 106  98 - 111 mmol/L Final   CO2 06/07/2021 26  22 - 32 mmol/L Final   Glucose, Bld 06/07/2021 149 (A) 70 - 99 mg/dL Final   Glucose reference range applies only to samples taken after fasting for at least 8 hours.   BUN 06/07/2021 22  8 - 23 mg/dL Final   Creatinine, Ser 06/07/2021 0.79  0.44 - 1.00 mg/dL Final   Calcium 06/07/2021 9.4  8.9 - 10.3 mg/dL Final   Total Protein 06/07/2021 7.1  6.5 - 8.1 g/dL Final   Albumin 06/07/2021 3.6  3.5 -  5.0 g/dL Final   AST 06/07/2021 22  15 - 41 U/L Final   ALT 06/07/2021 12  0 - 44 U/L Final   Alkaline Phosphatase 06/07/2021 43  38 - 126 U/L Final   Total Bilirubin 06/07/2021 0.9  0.3 - 1.2 mg/dL Final   GFR, Estimated 06/07/2021 >60  >60 mL/min Final   Comment: (NOTE) Calculated using the CKD-EPI Creatinine Equation (2021)    Anion gap 06/07/2021 6  5 - 15 Final   Performed at [facility], [address], [place], [place] [address]   WBC 06/07/2021 5.2  4.0 - 10.5 K/uL Final   RBC 06/07/2021 3.54 (A) 3.87 - 5.11 MIL/uL Final   Hemoglobin 06/07/2021 10.2 (A) 12.0 - 15.0 g/dL Final   HCT 06/07/2021 31.5 (A) 36.0 - 46.0 % Final   MCV 06/07/2021 89.0  80.0 - 100.0 fL Final   MCH 06/07/2021 28.8  26.0 - 34.0 pg Final   MCHC 06/07/2021 32.4  30.0 - 36.0 g/dL Final   RDW 06/07/2021 14.7  11.5 - 15.5 % Final   Platelets 06/07/2021 277  150 - 400 K/uL Final   nRBC 06/07/2021 0.0  0.0 - 0.2 % Final   Neutrophils Relative % 06/07/2021 57  % Final   Neutro Abs 06/07/2021 3.0  1.7 - 7.7 K/uL Final   Lymphocytes Relative 06/07/2021 35  % Final   Lymphs Abs 06/07/2021 1.8  0.7 - 4.0 K/uL Final   Monocytes Relative 06/07/2021 5  % Final   Monocytes Absolute 06/07/2021 0.3  0.1 - 1.0 K/uL Final   Eosinophils Relative 06/07/2021 2  % Final   Eosinophils Absolute 06/07/2021 0.1  0.0 - 0.5 K/uL Final   Basophils Relative 06/07/2021 1  % Final   Basophils Absolute 06/07/2021 0.0  0.0 - 0.1 K/uL Final    Immature Granulocytes 06/07/2021 0  % Final   Abs Immature Granulocytes 06/07/2021 0.02  0.00 - 0.07 K/uL Final   Performed at [facility], [address] [place], [place] [address]    Assessment:  Bridget Romero is a 75 y.o. 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 She receives IVIG monthly, methotrexate weekly, and prednisone 5 mg a day.    She received IVIG (Privigen) 75 gm x 2 days every 4 weeks beginning 12/07/2017. IVIG was decreased to every 8 weeks on 04/22/2019, but then returned to monthly secondary to rise CK.  IVIG was increased to 2 gm/kg on 08/25/2019, 09/23/2019, 10/21/2019, 11/25/2019, 02/02/2020, 03/29/2020, 05/25/2020, 07/05/2020, 08/16/2020, 09/27/2020, 11/08/2020, and 12/20/2020.  She was switched to every 8 weeks then every 6 weeks.   She has a normocytic anemia.  Baseline hemoglobin is 10.  Anemia work-up on 12/22/2019 revealed a hematocrit was 32.4, hemoglobin 10.0, MCV 92.8, platelets 258,000, WBC 6200, ANC 3700. Retic was 3.8%. LDH was 178. Coombs was negative.  Peripheral smear revealed a normocytic anemia.  The morphology of the RBCs, WBCs and platelets were within normal limits.    Last colonoscopy on 03/07/2016 revealed grade I hemorrhoids.  She has B12 deficiency and is on oral B12.  B12 was 306 and folate > 22.3 on 05/30/2017.    She has chronic mild elevation in LFTs which have correlated with increased CPK.  AST/ALT have been followed: 53/54 on 02/17/2019, 107/128 on 04/21/2019, 134/200 on 05/20/2019, 138/261 on 07/10/2019, 151/304 on 07/28/2019, 175/289 on 08/13/2019, 102/196 on 08/25/2019, 47/75 on 09/22/2019, 12/23 on 11/25/2019, 19/16 on 12/22/2019, 20/16 on 01/19/2020, 19/15 on 02/02/2020, 22/17 on 03/15/2020, 25/19 on  03/29/2020, 40/36 on 05/25/2020, 42/46 on 07/05/2020, 37/48 on 08/16/2020, 36/44 on 09/27/2020, 24/23 on 11/08/2020, 21/16 on 12/20/2020, 19/15 on 01/31/2021 and 20/14 on 03/14/2021.  Hepatitis B and C testing were negative on 06/20/2017 and  07/28/2019.  Symptomatically, she feels "pretty good."  Reports lower extremity myalgia that is intermittent.  Plan: Polymyositis Clinically, she is doing well on IVIG 2 g/kg every 6 weeks. She is on prednisone 5 mg a day and MTX 10 mg SQ weekly. CK is 69 (normal).  Aldolase is pending.  Continue IVIG every 6 weeks.   Continue to decrease steroid premedications. Labs reviewed.  Begin IVIG x4 days beginning today.  Normocytic anemia She has a stable mild normocytic anemia. Continue to monitor. Hemoglobin is 10.2.  Weight gain Secondary to steroids. Improvement noted with reduction of steroids. Recommend staying active and drinking plenty of fluids. Balanced diet educational materials provided to patient.  Disposition: Proceed with next cycle of IVIG over the next 4 days. RTC in 6 weeks for MD assessment, labs (CBC with diff, CMP, CK, aldolase) and IVIG over 4 days.  I discussed the assessment and treatment plan with the patient.  The patient was provided an opportunity to ask questions and all were answered.  The patient agreed with the plan and demonstrated an understanding of the instructions.  The patient was advised to call back if the symptoms worsen or if the condition fails to improve as anticipated.  Greater than 50% was spent in counseling and coordination of care with this patient including but not limited to discussion of the relevant topics above (See A&P) including, but not limited to diagnosis and management of acute and chronic medical conditions.   Faythe Casa, NP 06/07/2021 8:40 AM

## 2021-06-07 NOTE — Progress Notes (Signed)
Pt tolerated IVIG infusion today. VSS. No complaints voiced at time of discharge.

## 2021-06-07 NOTE — Patient Instructions (Addendum)
[facility]  Discharge Instructions: Thank you for choosing [facility] to provide your oncology and hematology care.  If you have a lab appointment with the [facility], please go directly to the [facility] and check in at the registration area.  Wear comfortable clothing and clothing appropriate for easy access to any Portacath or PICC line.   We strive to give you quality time with your provider. You may need to reschedule your appointment if you arrive late (15 or more minutes).  Arriving late affects you and other patients whose appointments are after yours.  Also, if you miss three or more appointments without notifying the office, you may be dismissed from the clinic at the provider's discretion.      For prescription refill requests, have your pharmacy contact our office and allow 72 hours for refills to be completed.    Today you received the following chemotherapy and/or immunotherapy agents IVIG   To help prevent nausea and vomiting after your treatment, we encourage you to take your nausea medication as directed.  BELOW ARE SYMPTOMS THAT SHOULD BE REPORTED IMMEDIATELY: *FEVER GREATER THAN 100.4 F (38 C) OR HIGHER *CHILLS OR SWEATING *NAUSEA AND VOMITING THAT IS NOT CONTROLLED WITH YOUR NAUSEA MEDICATION *UNUSUAL SHORTNESS OF BREATH *UNUSUAL BRUISING OR BLEEDING *URINARY PROBLEMS (pain or burning when urinating, or frequent urination) *BOWEL PROBLEMS (unusual diarrhea, constipation, pain near the anus) TENDERNESS IN MOUTH AND THROAT WITH OR WITHOUT PRESENCE OF ULCERS (sore throat, sores in mouth, or a toothache) UNUSUAL RASH, SWELLING OR PAIN  UNUSUAL VAGINAL DISCHARGE OR ITCHING   Items with * indicate a potential emergency and should be followed up as soon as possible or go to the Emergency Department if any problems should occur.  Please show the CHEMOTHERAPY ALERT CARD or IMMUNOTHERAPY ALERT CARD at check-in to the Emergency  Department and triage nurse.  Should you have questions after your visit or need to cancel or reschedule your appointment, please contact [facility]  [phone] and follow the prompts.  Office hours are 8:00 a.m. to 4:30 p.m. Monday - Friday. Please note that voicemails left after 4:00 p.m. may not be returned until the following business day.  We are closed weekends and major holidays. You have access to a nurse at all times for urgent questions. Please call the main number to the clinic [phone] and follow the prompts.  For any non-urgent questions, you may also contact your provider using MyChart. We now offer e-Visits for anyone 73 and older to request care online for non-urgent symptoms. For details visit [email or web address].   Also download the MyChart app! Go to the app store, search "MyChart", open the app, select [facility], and log in with your MyChart username and password.  Due to Covid, a mask is required upon entering the hospital/clinic. If you do not have a mask, one will be given to you upon arrival. For doctor visits, patients may have 1 support person aged 73 or older with them. For treatment visits, patients cannot have anyone with them due to current Covid guidelines and our immunocompromised population. Immune Globulin Injection What is this medication? IMMUNE GLOBULIN (im MUNE GLOB yoo lin) helps to prevent or reduce the severity of certain infections in patients who are at risk. This medicine is collected from the pooled blood of many donors. It is used to treat immune systemproblems, thrombocytopenia, and Kawasaki syndrome. This medicine may be used for other purposes; ask your  health care provider orpharmacist if you have questions. COMMON BRAND NAME(S): ASCENIV, Baygam, BIVIGAM, Carimune, Carimune NF, cutaquig, Cuvitru, Flebogamma, Flebogamma DIF, GamaSTAN, GamaSTAN S/D, Gamimune N, Gammagard, Gammagard S/D, Gammaked, Gammaplex, Gammar-P  IV, Gamunex, Gamunex-C, Hizentra, Iveegam, Iveegam EN, Octagam, Panglobulin, Panglobulin NF, panzyga, Polygam S/D, Privigen, Sandoglobulin, Venoglobulin-S, Vigam,Vivaglobulin, Xembify What should I tell my care team before I take this medication? They need to know if you have any of these conditions: diabetes extremely low or no immune antibodies in the blood heart disease history of blood clots hyperprolinemia infection in the blood, sepsis kidney disease recently received or scheduled to receive a vaccination an unusual or allergic reaction to human immune globulin, albumin, maltose, sucrose, other medicines, foods, dyes, or preservatives pregnant or trying to get pregnant breast-feeding How should I use this medication? This medicine is for injection into a muscle or infusion into a vein or skin. It is usually given by a health care professional in a hospital or clinicsetting. In rare cases, some brands of this medicine might be given at home. You will be taught how to give this medicine. Use exactly as directed. Take your medicineat regular intervals. Do not take your medicine more often than directed. Talk to your pediatrician regarding the use of this medicine in children. Whilethis drug may be prescribed for selected conditions, precautions do apply. Overdosage: If you think you have taken too much of this medicine contact apoison control center or emergency room at once. NOTE: This medicine is only for you. Do not share this medicine with others. What if I miss a dose? It is important not to miss your dose. Call your doctor or health care professional if you are unable to keep an appointment. If you give yourself the medicine and you miss a dose, take it as soon as you can. If it is almost timefor your next dose, take only that dose. Do not take double or extra doses. What may interact with this medication? aspirin and aspirin-like medicines cisplatin cyclosporine medicines for  infection like acyclovir, adefovir, amphotericin B, bacitracin, cidofovir, foscarnet, ganciclovir, gentamicin, pentamidine, vancomycin NSAIDS, medicines for pain and inflammation, like ibuprofen or naproxen pamidronate vaccines zoledronic acid This list may not describe all possible interactions. Give your health care provider a list of all the medicines, herbs, non-prescription drugs, or dietary supplements you use. Also tell them if you smoke, drink alcohol, or use illegaldrugs. Some items may interact with your medicine. What should I watch for while using this medication? Your condition will be monitored carefully while you are receiving thismedicine. This medicine is made from pooled blood donations of many different people. It may be possible to pass an infection in this medicine. However, the donors are screened for infections and all products are tested for HIV and hepatitis. The medicine is treated to kill most or all bacteria and viruses. Talk to yourdoctor about the risks and benefits of this medicine. Do not have vaccinations for at least 14 days before, or until at least 74months after receiving this medicine. What side effects may I notice from receiving this medication? Side effects that you should report to your doctor or health care professionalas soon as possible: allergic reactions like skin rash, itching or hives, swelling of the face, lips, or tongue blue colored lips or skin breathing problems chest pain or tightness fever signs and symptoms of aseptic meningitis such as stiff neck; sensitivity to light; headache; drowsiness; fever; nausea; vomiting; rash signs and symptoms of a blood clot such  as chest pain; shortness of breath; pain, swelling, or warmth in the leg signs and symptoms of hemolytic anemia such as fast heartbeat; tiredness; dark yellow or brown urine; or yellowing of the eyes or skin signs and symptoms of kidney injury like trouble passing urine or change in  the amount of urine sudden weight gain swelling of the ankles, feet, hands Side effects that usually do not require medical attention (report to yourdoctor or health care professional if they continue or are bothersome): diarrhea flushing headache increased sweating joint pain muscle cramps muscle pain nausea pain, redness, or irritation at site where injected tiredness This list may not describe all possible side effects. Call your doctor for medical advice about side effects. You may report side effects to FDA at1-800-FDA-1088. Where should I keep my medication? Keep out of the reach of children. This drug is usually given in a hospital or clinic and will not be stored athome. In rare cases, some brands of this medicine may be given at home. If you are using this medicine at home, you will be instructed on how to store thismedicine. Throw away any unused medicine after the expiration date on the label. NOTE: This sheet is a summary. It may not cover all possible information. If you have questions about this medicine, talk to your doctor, pharmacist, orhealth care provider.  2022 Elsevier/Gold Standard (2019-07-16 12:51:14)

## 2021-06-08 ENCOUNTER — Inpatient Hospital Stay: Payer: Medicare PPO

## 2021-06-08 VITALS — BP 152/78 | HR 58 | Temp 96.5°F | Resp 18

## 2021-06-08 DIAGNOSIS — M3320 Polymyositis, organ involvement unspecified: Secondary | ICD-10-CM

## 2021-06-08 DIAGNOSIS — Z79899 Other long term (current) drug therapy: Secondary | ICD-10-CM | POA: Diagnosis not present

## 2021-06-08 LAB — ALDOLASE: Aldolase: 3.7 U/L (ref 3.3–10.3)

## 2021-06-08 MED ORDER — DEXTROSE 5 % IV SOLN
Freq: Once | INTRAVENOUS | Status: AC
  Filled 2021-06-08: qty 250

## 2021-06-08 MED ORDER — ACETAMINOPHEN 325 MG PO TABS
650.0000 mg | ORAL_TABLET | Freq: Once | ORAL | Status: AC
Start: 2021-06-08 — End: 2021-06-08
  Administered 2021-06-08: 650 mg via ORAL

## 2021-06-08 MED ORDER — DIPHENHYDRAMINE HCL 25 MG PO TABS
25.0000 mg | ORAL_TABLET | Freq: Once | ORAL | Status: AC
  Administered 2021-06-08: 25 mg via ORAL
  Filled 2021-06-08: qty 1

## 2021-06-08 MED ORDER — METHYLPREDNISOLONE SODIUM SUCC 125 MG IJ SOLR
10.0000 mg | INTRAMUSCULAR | Status: DC
  Administered 2021-06-08: 10 mg via INTRAVENOUS

## 2021-06-08 MED ORDER — IMMUNE GLOBULIN (HUMAN) 20 GM/200ML IV SOLN
40.0000 g | INTRAVENOUS | Status: DC
  Filled 2021-06-08: qty 400

## 2021-06-08 MED ORDER — IMMUNE GLOBULIN (HUMAN) 20 GM/200ML IV SOLN
40.0000 g | INTRAVENOUS | Status: DC
  Administered 2021-06-08: 40 g via INTRAVENOUS
  Filled 2021-06-08: qty 400

## 2021-06-08 NOTE — Progress Notes (Signed)
Pt tolerated Day 2 of IVIG. Discharged to home at completion of infusion. VSS.

## 2021-06-08 NOTE — Patient Instructions (Signed)
Immune Globulin Injection What is this medication? IMMUNE GLOBULIN (im MUNE GLOB yoo lin) helps to prevent or reduce the severity of certain infections in patients who are at risk. This medicine is collected from the pooled blood of many donors. It is used to treat immune systemproblems, thrombocytopenia, and Kawasaki syndrome. This medicine may be used for other purposes; ask your health care provider orpharmacist if you have questions. COMMON BRAND NAME(S): ASCENIV, Baygam, BIVIGAM, Carimune, Carimune NF, cutaquig, Cuvitru, Flebogamma, Flebogamma DIF, GamaSTAN, GamaSTAN S/D, Gamimune N, Gammagard, Gammagard S/D, Gammaked, Gammaplex, Gammar-P IV, Gamunex, Gamunex-C, Hizentra, Iveegam, Iveegam EN, Octagam, Panglobulin, Panglobulin NF, panzyga, Polygam S/D, Privigen, Sandoglobulin, Venoglobulin-S, Vigam,Vivaglobulin, Xembify What should I tell my care team before I take this medication? They need to know if you have any of these conditions: diabetes extremely low or no immune antibodies in the blood heart disease history of blood clots hyperprolinemia infection in the blood, sepsis kidney disease recently received or scheduled to receive a vaccination an unusual or allergic reaction to human immune globulin, albumin, maltose, sucrose, other medicines, foods, dyes, or preservatives pregnant or trying to get pregnant breast-feeding How should I use this medication? This medicine is for injection into a muscle or infusion into a vein or skin. It is usually given by a health care professional in a hospital or clinicsetting. In rare cases, some brands of this medicine might be given at home. You will be taught how to give this medicine. Use exactly as directed. Take your medicineat regular intervals. Do not take your medicine more often than directed. Talk to your pediatrician regarding the use of this medicine in children. Whilethis drug may be prescribed for selected conditions, precautions do  apply. Overdosage: If you think you have taken too much of this medicine contact apoison control center or emergency room at once. NOTE: This medicine is only for you. Do not share this medicine with others. What if I miss a dose? It is important not to miss your dose. Call your doctor or health care professional if you are unable to keep an appointment. If you give yourself the medicine and you miss a dose, take it as soon as you can. If it is almost timefor your next dose, take only that dose. Do not take double or extra doses. What may interact with this medication? aspirin and aspirin-like medicines cisplatin cyclosporine medicines for infection like acyclovir, adefovir, amphotericin B, bacitracin, cidofovir, foscarnet, ganciclovir, gentamicin, pentamidine, vancomycin NSAIDS, medicines for pain and inflammation, like ibuprofen or naproxen pamidronate vaccines zoledronic acid This list may not describe all possible interactions. Give your health care provider a list of all the medicines, herbs, non-prescription drugs, or dietary supplements you use. Also tell them if you smoke, drink alcohol, or use illegaldrugs. Some items may interact with your medicine. What should I watch for while using this medication? Your condition will be monitored carefully while you are receiving thismedicine. This medicine is made from pooled blood donations of many different people. It may be possible to pass an infection in this medicine. However, the donors are screened for infections and all products are tested for HIV and hepatitis. The medicine is treated to kill most or all bacteria and viruses. Talk to yourdoctor about the risks and benefits of this medicine. Do not have vaccinations for at least 14 days before, or until at least 3months after receiving this medicine. What side effects may I notice from receiving this medication? Side effects that you should report to your doctor   or health care  professionalas soon as possible: allergic reactions like skin rash, itching or hives, swelling of the face, lips, or tongue blue colored lips or skin breathing problems chest pain or tightness fever signs and symptoms of aseptic meningitis such as stiff neck; sensitivity to light; headache; drowsiness; fever; nausea; vomiting; rash signs and symptoms of a blood clot such as chest pain; shortness of breath; pain, swelling, or warmth in the leg signs and symptoms of hemolytic anemia such as fast heartbeat; tiredness; dark yellow or brown urine; or yellowing of the eyes or skin signs and symptoms of kidney injury like trouble passing urine or change in the amount of urine sudden weight gain swelling of the ankles, feet, hands Side effects that usually do not require medical attention (report to yourdoctor or health care professional if they continue or are bothersome): diarrhea flushing headache increased sweating joint pain muscle cramps muscle pain nausea pain, redness, or irritation at site where injected tiredness This list may not describe all possible side effects. Call your doctor for medical advice about side effects. You may report side effects to FDA at1-800-FDA-1088. Where should I keep my medication? Keep out of the reach of children. This drug is usually given in a hospital or clinic and will not be stored athome. In rare cases, some brands of this medicine may be given at home. If you are using this medicine at home, you will be instructed on how to store thismedicine. Throw away any unused medicine after the expiration date on the label. NOTE: This sheet is a summary. It may not cover all possible information. If you have questions about this medicine, talk to your doctor, pharmacist, orhealth care provider.  2022 Elsevier/Gold Standard (2019-07-16 12:51:14)  

## 2021-06-09 ENCOUNTER — Inpatient Hospital Stay: Payer: Medicare PPO

## 2021-06-09 ENCOUNTER — Other Ambulatory Visit: Payer: Self-pay

## 2021-06-09 VITALS — BP 146/78 | HR 71 | Temp 96.5°F | Resp 18

## 2021-06-09 DIAGNOSIS — M3320 Polymyositis, organ involvement unspecified: Secondary | ICD-10-CM | POA: Diagnosis not present

## 2021-06-09 DIAGNOSIS — Z79899 Other long term (current) drug therapy: Secondary | ICD-10-CM | POA: Diagnosis not present

## 2021-06-09 MED ORDER — METHYLPREDNISOLONE SODIUM SUCC 40 MG IJ SOLR
10.0000 mg | INTRAMUSCULAR | Status: DC
  Filled 2021-06-09: qty 1

## 2021-06-09 MED ORDER — DEXTROSE 5 % IV SOLN
Freq: Once | INTRAVENOUS | Status: AC
  Filled 2021-06-09: qty 250

## 2021-06-09 MED ORDER — ACETAMINOPHEN 325 MG PO TABS
650.0000 mg | ORAL_TABLET | Freq: Once | ORAL | Status: AC
  Administered 2021-06-09: 650 mg via ORAL

## 2021-06-09 MED ORDER — IMMUNE GLOBULIN (HUMAN) 20 GM/200ML IV SOLN
40.0000 g | INTRAVENOUS | Status: DC
  Administered 2021-06-09: 40 g via INTRAVENOUS
  Filled 2021-06-09: qty 400

## 2021-06-09 MED ORDER — METHYLPREDNISOLONE SODIUM SUCC 125 MG IJ SOLR
10.0000 mg | INTRAMUSCULAR | Status: DC
  Administered 2021-06-09: 10 mg via INTRAVENOUS

## 2021-06-09 MED ORDER — DIPHENHYDRAMINE HCL 25 MG PO CAPS
25.0000 mg | ORAL_CAPSULE | Freq: Once | ORAL | Status: AC
Start: 2021-06-09 — End: 2021-06-09
  Administered 2021-06-09: 25 mg via ORAL

## 2021-06-09 NOTE — Patient Instructions (Signed)
[facility]  Discharge Instructions: Thank you for choosing [facility] to provide your oncology and hematology care.  If you have a lab appointment with the [facility], please go directly to the [facility] and check in at the registration area.  Wear comfortable clothing and clothing appropriate for easy access to any Portacath or PICC line.   We strive to give you quality time with your provider. You may need to reschedule your appointment if you arrive late (15 or more minutes).  Arriving late affects you and other patients whose appointments are after yours.  Also, if you miss three or more appointments without notifying the office, you may be dismissed from the clinic at the provider's discretion.      For prescription refill requests, have your pharmacy contact our office and allow 72 hours for refills to be completed.    Today you received the following chemotherapy and/or immunotherapy agents IVIG   To help prevent nausea and vomiting after your treatment, we encourage you to take your nausea medication as directed.  BELOW ARE SYMPTOMS THAT SHOULD BE REPORTED IMMEDIATELY: *FEVER GREATER THAN 100.4 F (38 C) OR HIGHER *CHILLS OR SWEATING *NAUSEA AND VOMITING THAT IS NOT CONTROLLED WITH YOUR NAUSEA MEDICATION *UNUSUAL SHORTNESS OF BREATH *UNUSUAL BRUISING OR BLEEDING *URINARY PROBLEMS (pain or burning when urinating, or frequent urination) *BOWEL PROBLEMS (unusual diarrhea, constipation, pain near the anus) TENDERNESS IN MOUTH AND THROAT WITH OR WITHOUT PRESENCE OF ULCERS (sore throat, sores in mouth, or a toothache) UNUSUAL RASH, SWELLING OR PAIN  UNUSUAL VAGINAL DISCHARGE OR ITCHING   Items with * indicate a potential emergency and should be followed up as soon as possible or go to the Emergency Department if any problems should occur.  Please show the CHEMOTHERAPY ALERT CARD or IMMUNOTHERAPY ALERT CARD at check-in to the Emergency  Department and triage nurse.  Should you have questions after your visit or need to cancel or reschedule your appointment, please contact [facility]  [phone] and follow the prompts.  Office hours are 8:00 a.m. to 4:30 p.m. Monday - Friday. Please note that voicemails left after 4:00 p.m. may not be returned until the following business day.  We are closed weekends and major holidays. You have access to a nurse at all times for urgent questions. Please call the main number to the clinic [phone] and follow the prompts.  For any non-urgent questions, you may also contact your provider using MyChart. We now offer e-Visits for anyone 60 and older to request care online for non-urgent symptoms. For details visit [email or web address].   Also download the MyChart app! Go to the app store, search "MyChart", open the app, select [facility], and log in with your MyChart username and password.  Due to Covid, a mask is required upon entering the hospital/clinic. If you do not have a mask, one will be given to you upon arrival. For doctor visits, patients may have 1 support person aged 71 or older with them. For treatment visits, patients cannot have anyone with them due to current Covid guidelines and our immunocompromised population.

## 2021-06-09 NOTE — Progress Notes (Signed)
Day 3 of IVIG completed. Tolerated infusion well. No complaints. Discharged to home.

## 2021-06-10 ENCOUNTER — Inpatient Hospital Stay: Payer: Medicare PPO

## 2021-06-10 VITALS — BP 149/74 | HR 78 | Temp 96.0°F | Resp 18

## 2021-06-10 DIAGNOSIS — Z79899 Other long term (current) drug therapy: Secondary | ICD-10-CM | POA: Diagnosis not present

## 2021-06-10 DIAGNOSIS — M3320 Polymyositis, organ involvement unspecified: Secondary | ICD-10-CM | POA: Diagnosis not present

## 2021-06-10 MED ORDER — DEXTROSE 5 % IV SOLN
Freq: Once | INTRAVENOUS | Status: AC
  Filled 2021-06-10: qty 250

## 2021-06-10 MED ORDER — IMMUNE GLOBULIN (HUMAN) 20 GM/200ML IV SOLN
40.0000 g | INTRAVENOUS | Status: DC
  Administered 2021-06-10: 40 g via INTRAVENOUS
  Filled 2021-06-10: qty 400

## 2021-06-10 MED ORDER — DIPHENHYDRAMINE HCL 25 MG PO TABS
25.0000 mg | ORAL_TABLET | Freq: Once | ORAL | Status: AC
  Administered 2021-06-10: 25 mg via ORAL
  Filled 2021-06-10: qty 1

## 2021-06-10 MED ORDER — METHYLPREDNISOLONE SODIUM SUCC 125 MG IJ SOLR
10.0000 mg | INTRAMUSCULAR | Status: DC
  Administered 2021-06-10: 10 mg via INTRAVENOUS

## 2021-06-10 MED ORDER — ACETAMINOPHEN 325 MG PO TABS
650.0000 mg | ORAL_TABLET | Freq: Once | ORAL | Status: AC
  Administered 2021-06-10: 650 mg via ORAL

## 2021-06-10 NOTE — Patient Instructions (Signed)
[facility]  Discharge Instructions: Thank you for choosing [facility] to provide your oncology and hematology care.  If you have a lab appointment with the [facility], please go directly to the [facility] and check in at the registration area.  Wear comfortable clothing and clothing appropriate for easy access to any Portacath or PICC line.   We strive to give you quality time with your provider. You may need to reschedule your appointment if you arrive late (15 or more minutes).  Arriving late affects you and other patients whose appointments are after yours.  Also, if you miss three or more appointments without notifying the office, you may be dismissed from the clinic at the provider's discretion.      For prescription refill requests, have your pharmacy contact our office and allow 72 hours for refills to be completed.    Today you received the following chemotherapy and/or immunotherapy agents IVIG    To help prevent nausea and vomiting after your treatment, we encourage you to take your nausea medication as directed.  BELOW ARE SYMPTOMS THAT SHOULD BE REPORTED IMMEDIATELY: *FEVER GREATER THAN 100.4 F (38 C) OR HIGHER *CHILLS OR SWEATING *NAUSEA AND VOMITING THAT IS NOT CONTROLLED WITH YOUR NAUSEA MEDICATION *UNUSUAL SHORTNESS OF BREATH *UNUSUAL BRUISING OR BLEEDING *URINARY PROBLEMS (pain or burning when urinating, or frequent urination) *BOWEL PROBLEMS (unusual diarrhea, constipation, pain near the anus) TENDERNESS IN MOUTH AND THROAT WITH OR WITHOUT PRESENCE OF ULCERS (sore throat, sores in mouth, or a toothache) UNUSUAL RASH, SWELLING OR PAIN  UNUSUAL VAGINAL DISCHARGE OR ITCHING   Items with * indicate a potential emergency and should be followed up as soon as possible or go to the Emergency Department if any problems should occur.  Please show the CHEMOTHERAPY ALERT CARD or IMMUNOTHERAPY ALERT CARD at check-in to the Emergency  Department and triage nurse.  Should you have questions after your visit or need to cancel or reschedule your appointment, please contact [facility]  [phone] and follow the prompts.  Office hours are 8:00 a.m. to 4:30 p.m. Monday - Friday. Please note that voicemails left after 4:00 p.m. may not be returned until the following business day.  We are closed weekends and major holidays. You have access to a nurse at all times for urgent questions. Please call the main number to the clinic [phone] and follow the prompts.  For any non-urgent questions, you may also contact your provider using MyChart. We now offer e-Visits for anyone 48 and older to request care online for non-urgent symptoms. For details visit [email or web address].   Also download the MyChart app! Go to the app store, search "MyChart", open the app, select [facility], and log in with your MyChart username and password.  Due to Covid, a mask is required upon entering the hospital/clinic. If you do not have a mask, one will be given to you upon arrival. For doctor visits, patients may have 1 support person aged 60 or older with them. For treatment visits, patients cannot have anyone with them due to current Covid guidelines and our immunocompromised population. Immune Globulin Injection What is this medication? IMMUNE GLOBULIN (im MUNE GLOB yoo lin) helps to prevent or reduce the severity of certain infections in patients who are at risk. This medicine is collected from the pooled blood of many donors. It is used to treat immune systemproblems, thrombocytopenia, and Kawasaki syndrome. This medicine may be used for other purposes; ask  your health care provider orpharmacist if you have questions. COMMON BRAND NAME(S): ASCENIV, Baygam, BIVIGAM, Carimune, Carimune NF, cutaquig, Cuvitru, Flebogamma, Flebogamma DIF, GamaSTAN, GamaSTAN S/D, Gamimune N, Gammagard, Gammagard S/D, Gammaked, Gammaplex, Gammar-P  IV, Gamunex, Gamunex-C, Hizentra, Iveegam, Iveegam EN, Octagam, Panglobulin, Panglobulin NF, panzyga, Polygam S/D, Privigen, Sandoglobulin, Venoglobulin-S, Vigam,Vivaglobulin, Xembify What should I tell my care team before I take this medication? They need to know if you have any of these conditions: diabetes extremely low or no immune antibodies in the blood heart disease history of blood clots hyperprolinemia infection in the blood, sepsis kidney disease recently received or scheduled to receive a vaccination an unusual or allergic reaction to human immune globulin, albumin, maltose, sucrose, other medicines, foods, dyes, or preservatives pregnant or trying to get pregnant breast-feeding How should I use this medication? This medicine is for injection into a muscle or infusion into a vein or skin. It is usually given by a health care professional in a hospital or clinicsetting. In rare cases, some brands of this medicine might be given at home. You will be taught how to give this medicine. Use exactly as directed. Take your medicineat regular intervals. Do not take your medicine more often than directed. Talk to your pediatrician regarding the use of this medicine in children. Whilethis drug may be prescribed for selected conditions, precautions do apply. Overdosage: If you think you have taken too much of this medicine contact apoison control center or emergency room at once. NOTE: This medicine is only for you. Do not share this medicine with others. What if I miss a dose? It is important not to miss your dose. Call your doctor or health care professional if you are unable to keep an appointment. If you give yourself the medicine and you miss a dose, take it as soon as you can. If it is almost timefor your next dose, take only that dose. Do not take double or extra doses. What may interact with this medication? aspirin and aspirin-like medicines cisplatin cyclosporine medicines for  infection like acyclovir, adefovir, amphotericin B, bacitracin, cidofovir, foscarnet, ganciclovir, gentamicin, pentamidine, vancomycin NSAIDS, medicines for pain and inflammation, like ibuprofen or naproxen pamidronate vaccines zoledronic acid This list may not describe all possible interactions. Give your health care provider a list of all the medicines, herbs, non-prescription drugs, or dietary supplements you use. Also tell them if you smoke, drink alcohol, or use illegaldrugs. Some items may interact with your medicine. What should I watch for while using this medication? Your condition will be monitored carefully while you are receiving thismedicine. This medicine is made from pooled blood donations of many different people. It may be possible to pass an infection in this medicine. However, the donors are screened for infections and all products are tested for HIV and hepatitis. The medicine is treated to kill most or all bacteria and viruses. Talk to yourdoctor about the risks and benefits of this medicine. Do not have vaccinations for at least 14 days before, or until at least 81months after receiving this medicine. What side effects may I notice from receiving this medication? Side effects that you should report to your doctor or health care professionalas soon as possible: allergic reactions like skin rash, itching or hives, swelling of the face, lips, or tongue blue colored lips or skin breathing problems chest pain or tightness fever signs and symptoms of aseptic meningitis such as stiff neck; sensitivity to light; headache; drowsiness; fever; nausea; vomiting; rash signs and symptoms of a blood clot  such as chest pain; shortness of breath; pain, swelling, or warmth in the leg signs and symptoms of hemolytic anemia such as fast heartbeat; tiredness; dark yellow or brown urine; or yellowing of the eyes or skin signs and symptoms of kidney injury like trouble passing urine or change in  the amount of urine sudden weight gain swelling of the ankles, feet, hands Side effects that usually do not require medical attention (report to yourdoctor or health care professional if they continue or are bothersome): diarrhea flushing headache increased sweating joint pain muscle cramps muscle pain nausea pain, redness, or irritation at site where injected tiredness This list may not describe all possible side effects. Call your doctor for medical advice about side effects. You may report side effects to FDA at1-800-FDA-1088. Where should I keep my medication? Keep out of the reach of children. This drug is usually given in a hospital or clinic and will not be stored athome. In rare cases, some brands of this medicine may be given at home. If you are using this medicine at home, you will be instructed on how to store thismedicine. Throw away any unused medicine after the expiration date on the label. NOTE: This sheet is a summary. It may not cover all possible information. If you have questions about this medicine, talk to your doctor, pharmacist, orhealth care provider.  2022 Elsevier/Gold Standard (2019-07-16 12:51:14)

## 2021-06-10 NOTE — Progress Notes (Signed)
Completed 4th day of IVIG without any concerns. VSS. Discharged to home.

## 2021-07-13 DIAGNOSIS — E119 Type 2 diabetes mellitus without complications: Secondary | ICD-10-CM | POA: Diagnosis not present

## 2021-07-13 LAB — HM DIABETES EYE EXAM

## 2021-07-19 ENCOUNTER — Other Ambulatory Visit: Payer: Self-pay

## 2021-07-19 ENCOUNTER — Inpatient Hospital Stay: Payer: Medicare PPO | Attending: Internal Medicine

## 2021-07-19 ENCOUNTER — Inpatient Hospital Stay (HOSPITAL_BASED_OUTPATIENT_CLINIC_OR_DEPARTMENT_OTHER): Payer: Medicare PPO | Admitting: Internal Medicine

## 2021-07-19 ENCOUNTER — Ambulatory Visit: Payer: Medicare PPO

## 2021-07-19 VITALS — BP 147/80 | HR 78

## 2021-07-19 DIAGNOSIS — M3320 Polymyositis, organ involvement unspecified: Secondary | ICD-10-CM

## 2021-07-19 DIAGNOSIS — Z79899 Other long term (current) drug therapy: Secondary | ICD-10-CM | POA: Insufficient documentation

## 2021-07-19 LAB — CBC WITH DIFFERENTIAL/PLATELET
Abs Immature Granulocytes: 0.01 10*3/uL (ref 0.00–0.07)
Basophils Absolute: 0 10*3/uL (ref 0.0–0.1)
Basophils Relative: 1 %
Eosinophils Absolute: 0.1 10*3/uL (ref 0.0–0.5)
Eosinophils Relative: 1 %
HCT: 30.7 % — ABNORMAL LOW (ref 36.0–46.0)
Hemoglobin: 10 g/dL — ABNORMAL LOW (ref 12.0–15.0)
Immature Granulocytes: 0 %
Lymphocytes Relative: 30 %
Lymphs Abs: 1.7 10*3/uL (ref 0.7–4.0)
MCH: 29.4 pg (ref 26.0–34.0)
MCHC: 32.6 g/dL (ref 30.0–36.0)
MCV: 90.3 fL (ref 80.0–100.0)
Monocytes Absolute: 0.3 10*3/uL (ref 0.1–1.0)
Monocytes Relative: 6 %
Neutro Abs: 3.6 10*3/uL (ref 1.7–7.7)
Neutrophils Relative %: 62 %
Platelets: 302 10*3/uL (ref 150–400)
RBC: 3.4 MIL/uL — ABNORMAL LOW (ref 3.87–5.11)
RDW: 14.9 % (ref 11.5–15.5)
WBC: 5.8 10*3/uL (ref 4.0–10.5)
nRBC: 0 % (ref 0.0–0.2)

## 2021-07-19 LAB — COMPREHENSIVE METABOLIC PANEL
ALT: 13 U/L (ref 0–44)
AST: 20 U/L (ref 15–41)
Albumin: 3.5 g/dL (ref 3.5–5.0)
Alkaline Phosphatase: 44 U/L (ref 38–126)
Anion gap: 7 (ref 5–15)
BUN: 25 mg/dL — ABNORMAL HIGH (ref 8–23)
CO2: 24 mmol/L (ref 22–32)
Calcium: 9.6 mg/dL (ref 8.9–10.3)
Chloride: 106 mmol/L (ref 98–111)
Creatinine, Ser: 0.76 mg/dL (ref 0.44–1.00)
GFR, Estimated: >60 mL/min (ref >60)
Glucose, Bld: 190 mg/dL — ABNORMAL HIGH (ref 70–99)
Potassium: 4 mmol/L (ref 3.5–5.1)
Sodium: 137 mmol/L (ref 135–145)
Total Bilirubin: 0.6 mg/dL (ref 0.3–1.2)
Total Protein: 7 g/dL (ref 6.5–8.1)

## 2021-07-19 LAB — CK: Total CK: 53 U/L (ref 38–234)

## 2021-07-19 MED ORDER — IMMUNE GLOBULIN (HUMAN) 20 GM/200ML IV SOLN
40.0000 g | INTRAVENOUS | Status: DC
  Administered 2021-07-19: 40 g via INTRAVENOUS
  Filled 2021-07-19: qty 400

## 2021-07-19 MED ORDER — METHYLPREDNISOLONE SODIUM SUCC 125 MG IJ SOLR
10.0000 mg | INTRAMUSCULAR | Status: DC
  Administered 2021-07-19: 10 mg via INTRAVENOUS

## 2021-07-19 MED ORDER — ACETAMINOPHEN 325 MG PO TABS
650.0000 mg | ORAL_TABLET | Freq: Once | ORAL | Status: AC
  Administered 2021-07-19: 650 mg via ORAL
  Filled 2021-07-19: qty 2

## 2021-07-19 MED ORDER — METHYLPREDNISOLONE SODIUM SUCC 40 MG IJ SOLR
10.0000 mg | INTRAMUSCULAR | Status: DC
  Filled 2021-07-19: qty 1

## 2021-07-19 MED ORDER — DEXTROSE 5 % IV SOLN
INTRAVENOUS | Status: DC
  Filled 2021-07-19: qty 250

## 2021-07-19 MED ORDER — DIPHENHYDRAMINE HCL 25 MG PO TABS
25.0000 mg | ORAL_TABLET | Freq: Once | ORAL | Status: AC
  Administered 2021-07-19: 25 mg via ORAL
  Filled 2021-07-19: qty 1

## 2021-07-19 NOTE — Progress Notes (Signed)
[facility] CONSULT NOTE  Patient Care Team: [name], MD as PCP - General [name] Susann Givens., MD (Rheumatology) Lequita Asal, MD as Referring Physician (Hematology and Oncology)  CHIEF COMPLAINTS/PURPOSE OF CONSULTATION: Polymyositis/IVIG infusions  #Polymyositis [Dr. [name]; 2019; left arm biopsy]; low dose prednisone/methotrexate/IVIG infusions 400 mg kilogram daily x4 days every 6 weeks  Oncology History   No history exists.     HISTORY OF PRESENTING ILLNESS:  Bridget Romero 75 y.o.  female with a history of polymyositis is here for follow-up/proceed with IVIG infusions.  Patient denies any worsening weakness in her legs or arms.  However she complains of weight gain which is attributed to her long-term steroids.  Otherwise denies any nausea vomiting abdominal pain or headache postinfusion.  Review of Systems  Constitutional:  Negative for chills, diaphoresis, fever, malaise/fatigue and weight loss.  HENT:  Negative for nosebleeds and sore throat.   Eyes:  Negative for double vision.  Respiratory:  Negative for cough, hemoptysis, sputum production, shortness of breath and wheezing.   Cardiovascular:  Negative for chest pain, palpitations, orthopnea and leg swelling.  Gastrointestinal:  Negative for abdominal pain, blood in stool, constipation, diarrhea, heartburn, melena, nausea and vomiting.  Genitourinary:  Negative for dysuria, frequency and urgency.  Musculoskeletal:  Negative for back pain and joint pain.  Skin: Negative.  Negative for itching and rash.  Neurological:  Negative for dizziness, tingling, focal weakness, weakness and headaches.  Endo/Heme/Allergies:  Does not bruise/bleed easily.  Psychiatric/Behavioral:  Negative for depression. The patient is not nervous/anxious and does not have insomnia.     MEDICAL HISTORY:  Past Medical History:  Diagnosis Date   Allergy    Asthma    Diabetes mellitus    Hyperlipidemia     Hypertension    Neuromuscular disorder ([place])    polymyositis   Osteoarthritis    Vitamin B12 deficiency     SURGICAL HISTORY: Past Surgical History:  Procedure Laterality Date   COLONOSCOPY WITH PROPOFOL N/A 03/07/2016   Procedure: COLONOSCOPY WITH PROPOFOL;  Surgeon: Lollie Sails, MD;  Location: [facility] ENDOSCOPY;  Service: Endoscopy;  Laterality: N/A;    SOCIAL HISTORY: Social History   Socioeconomic History   Marital status: Widowed    Spouse name: Not on file   Number of children: 2   Years of education: Not on file   Highest education level: Not on file  Occupational History   Occupation: Retired as Training and development officer at [employer]:    Tobacco Use   Smoking status: Former   Smokeless tobacco: Never   Tobacco comments:    age 63 when stopped smoking  Vaping Use   Vaping Use: Never used  Substance and Sexual Activity   Alcohol use: No   Drug use: No   Sexual activity: Not on file  Other Topics Concern   Not on file  Social History Narrative   has living will   Son and daughter should make health care decisions for her   Would accept resuscitation   Not sure about tube feeds   Social Determinants of Health   Financial Resource Strain: Not on file  Food Insecurity: Not on file  Transportation Needs: Not on file  Physical Activity: Not on file  Stress: Not on file  Social Connections: Not on file  Intimate Partner Violence: Not on file    FAMILY HISTORY: Family History  Problem Relation Age of Onset   Heart disease Mother  Cancer Father    Breast cancer Neg Hx     ALLERGIES:  is allergic to atorvastatin and pravastatin.  MEDICATIONS:  Current Outpatient Medications  Medication Sig Dispense Refill   Accu-Chek FastClix Lancets MISC USE TO TEST BLOOD SUGAR TWICE DAILY DX E11.40 102 each 11   acetaminophen (TYLENOL) 500 MG tablet Take 500 mg by mouth every 6 (six) hours as needed.     Alcohol Swabs (B-D SINGLE USE SWABS REGULAR)  PADS Use to test blood sugar once daily dx: 250.00 100 each 3   B-D TB SYRINGE 1CC/25GX5/8" 25G X 5/8" 1 ML MISC      Blood Glucose Calibration (ACCU-CHEK AVIVA) SOLN Use as directed 1 each 3   Cholecalciferol (VITAMIN D) 1000 UNITS capsule Take 1,000 Units by mouth daily.     diclofenac Sodium (VOLTAREN) 1 % GEL Apply 4 g topically 4 (four) times daily. 123XX123 g 1   folic acid (FOLVITE) 1 MG tablet Take 1 mg by mouth daily.     gabapentin (NEURONTIN) 300 MG capsule Take 300 mg by mouth 2 (two) times daily.     glucose blood (ACCU-CHEK SMARTVIEW) test strip USE AS DIRECTED TO CHECK BLOOD SUGAR TWICE DAILY 100 each 6   LANTUS 100 UNIT/ML injection INJECT 10 TO 20 UNITS INTO THE SKIN DAILY AS DIRECTED 10 mL 1   lisinopril (ZESTRIL) 10 MG tablet Take 1 tablet (10 mg total) by mouth daily. 90 tablet 3   magnesium citrate SOLN Take by mouth.     metFORMIN (GLUCOPHAGE) 1000 MG tablet TAKE 1 TABLET BY MOUTH TWICE A DAY 180 tablet 3   methotrexate 250 MG/10ML injection Inject into the skin.      Multiple Vitamin (MULTIVITAMIN) tablet Take 1 tablet by mouth daily.     nystatin cream (MYCOSTATIN) Apply topically 2 (two) times daily.     predniSONE (DELTASONE) 10 MG tablet Take 5 mg by mouth daily.     No current facility-administered medications for this visit.   Facility-Administered Medications Ordered in Other Visits  Medication Dose Route Frequency Provider Last Rate Last Admin   dextrose 5 % solution   Intravenous Continuous Cammie Sickle, MD   Stopped at 07/19/21 1256   Immune Globulin 10% (PRIVIGEN) IV infusion 40 g  40 g Intravenous Q24H Lequita Asal, MD   Stopped at 07/19/21 1246   methylPREDNISolone sodium succinate (SOLU-MEDROL) 125 mg/2 mL injection 10 mg  10 mg Intravenous Q24H Cammie Sickle, MD   10 mg at 07/19/21 0959      .  PHYSICAL EXAMINATION: ECOG PERFORMANCE STATUS: 0 - Asymptomatic  Vitals:   07/19/21 0855  BP: 137/68  Pulse: 74  Resp: 20  Temp:  98.2 F (36.8 C)   Filed Weights   07/19/21 0855  Weight: 201 lb 11.5 oz (91.5 kg)    Physical Exam Vitals and nursing note reviewed.  Constitutional:      Comments: Alone; Ambulating independently      HENT:     Head: Normocephalic and atraumatic.     Mouth/Throat:     Pharynx: Oropharynx is clear.  Eyes:     Extraocular Movements: Extraocular movements intact.     Pupils: Pupils are equal, round, and reactive to light.  Cardiovascular:     Rate and Rhythm: Normal rate and regular rhythm.  Pulmonary:     Comments: Decreased breath sounds bilaterally.  Abdominal:     Palpations: Abdomen is soft.  Musculoskeletal:  General: Normal range of motion.     Cervical back: Normal range of motion.  Skin:    General: Skin is warm.  Neurological:     General: No focal deficit present.     Mental Status: She is alert and oriented to person, place, and time.  Psychiatric:        Behavior: Behavior normal.        Judgment: Judgment normal.    LABORATORY DATA:  I have reviewed the data as listed Lab Results  Component Value Date   WBC 5.8 07/19/2021   HGB 10.0 (L) 07/19/2021   HCT 30.7 (L) 07/19/2021   MCV 90.3 07/19/2021   PLT 302 07/19/2021   Recent Labs    08/16/20 0841 09/27/20 0844 11/08/20 0825 04/25/21 0823 06/07/21 0811 07/19/21 0829  NA 141 139   < > 139 138 137  K 3.9 3.6   < > 3.6 3.7 4.0  CL 104 102   < > 104 106 106  CO2 29 28   < > '26 26 24  '$ GLUCOSE 180* 189*   < > 200* 149* 190*  BUN 18 19   < > 17 22 25*  CREATININE 0.73 0.96   < > 0.78 0.79 0.76  CALCIUM 9.6 9.4   < > 9.0 9.4 9.6  GFRNONAA >60 59*   < > >60 >60 >60  GFRAA >60 >60  --   --   --   --   PROT 6.9 6.9   < > 7.0 7.1 7.0  ALBUMIN 3.7 3.6   < > 3.6 3.6 3.5  AST 37 36   < > '25 22 20  '$ ALT 48* 44   < > '14 12 13  '$ ALKPHOS 40 43   < > 45 43 44  BILITOT 0.8 1.0   < > 0.5 0.9 0.6   < > = values in this interval not displayed.    RADIOGRAPHIC STUDIES: I have personally reviewed  the radiological images as listed and agreed with the findings in the report. No results found.  ASSESSMENT & PLAN:   Polymyositis, organ involvement unspecified ([place]) # Polymyositis [Dr. [name]; Clinically, she is doing well on IVIG 2 g/kg every 6 weeks; also prednisone 5 mg a day and MTX 10 mg SQ weekly.; CK is 69 (normal); Aldolase is pending.Continue IVIG every 6 weeks. Continue to decrease steroid premedications. Labs reviewed.  Begin IVIG x4 days beginning today.  # Normocytic anemia: chronic Hb ~10.   # Overactive bladder: ? Defer to PCP re: urology referral/   # Weight gain: Secondary to steroids.   # Disposition: # Proceed with next cycle of IVIG over the next 4 days. # RTC in 6 weeks for MD assessment, labs (CBC with diff, CMP, CK, aldolase) and IVIG over 4 days- Dr.B.  All questions were answered. The patient knows to call the clinic with any problems, questions or concerns.    Cammie Sickle, MD 07/19/2021 3:58 PM

## 2021-07-19 NOTE — Assessment & Plan Note (Addendum)
#   Polymyositis [Dr. [name]; Clinically, she is doing well on IVIG 2 g/kg every 6 weeks; also prednisone 5 mg a day and MTX'10mg'$ SQweekly.; CK is 69 (normal); Aldolase is pending.Continue IVIG every 6 weeks. Continue to decrease steroid premedications. Labs reviewed.  Begin IVIG x4 days beginning today.  # Normocytic anemia: chronic Hb ~10.   # Overactive bladder: ? Defer to PCP re: urology referral/  # Weight gain: Secondary to steroids.  # Disposition: # Proceed with next cycle of IVIG over the next 4 days. # RTC in 6 weeks for MD assessment, labs (CBC with diff, CMP, CK, aldolase) and IVIG over 4 days- Dr.B.

## 2021-07-20 ENCOUNTER — Inpatient Hospital Stay: Payer: Medicare PPO

## 2021-07-20 VITALS — BP 146/76 | HR 78 | Temp 97.8°F | Resp 18

## 2021-07-20 DIAGNOSIS — Z79899 Other long term (current) drug therapy: Secondary | ICD-10-CM | POA: Diagnosis not present

## 2021-07-20 DIAGNOSIS — M3320 Polymyositis, organ involvement unspecified: Secondary | ICD-10-CM | POA: Diagnosis not present

## 2021-07-20 LAB — ALDOLASE: Aldolase: 3.8 U/L (ref 3.3–10.3)

## 2021-07-20 MED ORDER — METHYLPREDNISOLONE SODIUM SUCC 125 MG IJ SOLR
10.0000 mg | INTRAMUSCULAR | Status: DC
  Administered 2021-07-20: 10 mg via INTRAVENOUS
  Filled 2021-07-20: qty 2

## 2021-07-20 MED ORDER — IMMUNE GLOBULIN (HUMAN) 20 GM/200ML IV SOLN
40.0000 g | INTRAVENOUS | Status: DC
  Administered 2021-07-20: 40 g via INTRAVENOUS
  Filled 2021-07-20 (×4): qty 400

## 2021-07-20 MED ORDER — DIPHENHYDRAMINE HCL 25 MG PO TABS
25.0000 mg | ORAL_TABLET | Freq: Once | ORAL | Status: AC
  Administered 2021-07-20: 25 mg via ORAL
  Filled 2021-07-20: qty 1

## 2021-07-20 MED ORDER — DEXTROSE 5 % IV SOLN
INTRAVENOUS | Status: DC
  Filled 2021-07-20: qty 250

## 2021-07-20 MED ORDER — ACETAMINOPHEN 325 MG PO TABS
650.0000 mg | ORAL_TABLET | Freq: Once | ORAL | Status: AC
  Administered 2021-07-20: 650 mg via ORAL
  Filled 2021-07-20: qty 2

## 2021-07-21 ENCOUNTER — Other Ambulatory Visit: Payer: Self-pay

## 2021-07-21 ENCOUNTER — Inpatient Hospital Stay: Payer: Medicare PPO

## 2021-07-21 VITALS — BP 156/78 | HR 71 | Temp 96.6°F | Resp 18

## 2021-07-21 DIAGNOSIS — M3320 Polymyositis, organ involvement unspecified: Secondary | ICD-10-CM

## 2021-07-21 DIAGNOSIS — Z79899 Other long term (current) drug therapy: Secondary | ICD-10-CM | POA: Diagnosis not present

## 2021-07-21 MED ORDER — DEXTROSE 5 % IV SOLN
INTRAVENOUS | Status: DC
  Filled 2021-07-21: qty 250

## 2021-07-21 MED ORDER — METHYLPREDNISOLONE SODIUM SUCC 125 MG IJ SOLR
10.0000 mg | INTRAMUSCULAR | Status: DC
  Administered 2021-07-21: 10 mg via INTRAVENOUS
  Filled 2021-07-21: qty 2

## 2021-07-21 MED ORDER — ACETAMINOPHEN 325 MG PO TABS
650.0000 mg | ORAL_TABLET | Freq: Once | ORAL | Status: AC
  Administered 2021-07-21: 650 mg via ORAL
  Filled 2021-07-21: qty 2

## 2021-07-21 MED ORDER — IMMUNE GLOBULIN (HUMAN) 20 GM/200ML IV SOLN
40.0000 g | INTRAVENOUS | Status: DC
  Administered 2021-07-21: 40 g via INTRAVENOUS
  Filled 2021-07-21: qty 400

## 2021-07-21 MED ORDER — DIPHENHYDRAMINE HCL 25 MG PO TABS
25.0000 mg | ORAL_TABLET | Freq: Once | ORAL | Status: AC
  Administered 2021-07-21: 25 mg via ORAL
  Filled 2021-07-21: qty 1

## 2021-07-22 ENCOUNTER — Inpatient Hospital Stay: Payer: Medicare PPO

## 2021-07-22 VITALS — BP 163/78 | HR 77 | Temp 96.6°F | Resp 18

## 2021-07-22 DIAGNOSIS — M3320 Polymyositis, organ involvement unspecified: Secondary | ICD-10-CM | POA: Diagnosis not present

## 2021-07-22 DIAGNOSIS — Z79899 Other long term (current) drug therapy: Secondary | ICD-10-CM | POA: Diagnosis not present

## 2021-07-22 MED ORDER — DEXTROSE 5 % IV SOLN
INTRAVENOUS | Status: DC
  Filled 2021-07-22: qty 250

## 2021-07-22 MED ORDER — METHYLPREDNISOLONE SODIUM SUCC 40 MG IJ SOLR
10.0000 mg | Freq: Once | INTRAMUSCULAR | Status: DC
  Filled 2021-07-22: qty 1

## 2021-07-22 MED ORDER — DIPHENHYDRAMINE HCL 25 MG PO CAPS
25.0000 mg | ORAL_CAPSULE | Freq: Once | ORAL | Status: AC
  Administered 2021-07-22: 25 mg via ORAL
  Filled 2021-07-22: qty 1

## 2021-07-22 MED ORDER — IMMUNE GLOBULIN (HUMAN) 20 GM/200ML IV SOLN
40.0000 g | INTRAVENOUS | Status: DC
  Administered 2021-07-22: 40 g via INTRAVENOUS
  Filled 2021-07-22: qty 400

## 2021-07-22 MED ORDER — ACETAMINOPHEN 325 MG PO TABS
650.0000 mg | ORAL_TABLET | Freq: Once | ORAL | Status: AC
  Administered 2021-07-22: 650 mg via ORAL
  Filled 2021-07-22: qty 2

## 2021-07-22 MED ORDER — METHYLPREDNISOLONE SODIUM SUCC 125 MG IJ SOLR
10.0000 mg | Freq: Once | INTRAMUSCULAR | Status: AC
  Administered 2021-07-22: 10 mg via INTRAVENOUS
  Filled 2021-07-22: qty 2

## 2021-07-28 ENCOUNTER — Other Ambulatory Visit: Payer: Self-pay | Admitting: Internal Medicine

## 2021-08-02 DIAGNOSIS — G629 Polyneuropathy, unspecified: Secondary | ICD-10-CM | POA: Diagnosis not present

## 2021-08-02 DIAGNOSIS — M3322 Polymyositis with myopathy: Secondary | ICD-10-CM | POA: Diagnosis not present

## 2021-08-02 DIAGNOSIS — E119 Type 2 diabetes mellitus without complications: Secondary | ICD-10-CM | POA: Diagnosis not present

## 2021-08-25 ENCOUNTER — Other Ambulatory Visit: Payer: Self-pay | Admitting: Internal Medicine

## 2021-08-25 DIAGNOSIS — I10 Essential (primary) hypertension: Secondary | ICD-10-CM

## 2021-08-30 ENCOUNTER — Inpatient Hospital Stay: Payer: Medicare PPO | Attending: Internal Medicine

## 2021-08-30 ENCOUNTER — Other Ambulatory Visit: Payer: Self-pay

## 2021-08-30 ENCOUNTER — Encounter: Payer: Self-pay | Admitting: Internal Medicine

## 2021-08-30 ENCOUNTER — Inpatient Hospital Stay (HOSPITAL_BASED_OUTPATIENT_CLINIC_OR_DEPARTMENT_OTHER): Payer: Medicare PPO | Admitting: Internal Medicine

## 2021-08-30 ENCOUNTER — Inpatient Hospital Stay: Payer: Medicare PPO

## 2021-08-30 VITALS — BP 148/83 | HR 68 | Resp 18

## 2021-08-30 VITALS — BP 146/70 | HR 69 | Temp 97.2°F | Resp 18 | Wt 202.5 lb

## 2021-08-30 DIAGNOSIS — D649 Anemia, unspecified: Secondary | ICD-10-CM | POA: Insufficient documentation

## 2021-08-30 DIAGNOSIS — M3320 Polymyositis, organ involvement unspecified: Secondary | ICD-10-CM

## 2021-08-30 DIAGNOSIS — Z79899 Other long term (current) drug therapy: Secondary | ICD-10-CM | POA: Diagnosis not present

## 2021-08-30 DIAGNOSIS — R350 Frequency of micturition: Secondary | ICD-10-CM

## 2021-08-30 LAB — CBC WITH DIFFERENTIAL/PLATELET
Abs Immature Granulocytes: 0.01 10*3/uL (ref 0.00–0.07)
Basophils Absolute: 0 10*3/uL (ref 0.0–0.1)
Basophils Relative: 1 %
Eosinophils Absolute: 0.1 10*3/uL (ref 0.0–0.5)
Eosinophils Relative: 2 %
HCT: 29.7 % — ABNORMAL LOW (ref 36.0–46.0)
Hemoglobin: 9.6 g/dL — ABNORMAL LOW (ref 12.0–15.0)
Immature Granulocytes: 0 %
Lymphocytes Relative: 30 %
Lymphs Abs: 1.4 10*3/uL (ref 0.7–4.0)
MCH: 29.3 pg (ref 26.0–34.0)
MCHC: 32.3 g/dL (ref 30.0–36.0)
MCV: 90.5 fL (ref 80.0–100.0)
Monocytes Absolute: 0.3 10*3/uL (ref 0.1–1.0)
Monocytes Relative: 6 %
Neutro Abs: 2.9 10*3/uL (ref 1.7–7.7)
Neutrophils Relative %: 61 %
Platelets: 284 10*3/uL (ref 150–400)
RBC: 3.28 MIL/uL — ABNORMAL LOW (ref 3.87–5.11)
RDW: 14.3 % (ref 11.5–15.5)
WBC: 4.7 10*3/uL (ref 4.0–10.5)
nRBC: 0 % (ref 0.0–0.2)

## 2021-08-30 LAB — COMPREHENSIVE METABOLIC PANEL
ALT: 12 U/L (ref 0–44)
AST: 18 U/L (ref 15–41)
Albumin: 3.5 g/dL (ref 3.5–5.0)
Alkaline Phosphatase: 47 U/L (ref 38–126)
Anion gap: 6 (ref 5–15)
BUN: 18 mg/dL (ref 8–23)
CO2: 29 mmol/L (ref 22–32)
Calcium: 9.2 mg/dL (ref 8.9–10.3)
Chloride: 103 mmol/L (ref 98–111)
Creatinine, Ser: 0.73 mg/dL (ref 0.44–1.00)
GFR, Estimated: >60 mL/min (ref >60)
Glucose, Bld: 135 mg/dL — ABNORMAL HIGH (ref 70–99)
Potassium: 3.9 mmol/L (ref 3.5–5.1)
Sodium: 138 mmol/L (ref 135–145)
Total Bilirubin: 0.6 mg/dL (ref 0.3–1.2)
Total Protein: 7.1 g/dL (ref 6.5–8.1)

## 2021-08-30 LAB — IRON AND TIBC
Iron: 108 ug/dL (ref 28–170)
Saturation Ratios: 38 % — ABNORMAL HIGH (ref 10.4–31.8)
TIBC: 281 ug/dL (ref 250–450)
UIBC: 173 ug/dL

## 2021-08-30 LAB — FERRITIN: Ferritin: 62 ng/mL (ref 11–307)

## 2021-08-30 LAB — LACTATE DEHYDROGENASE: LDH: 161 U/L (ref 98–192)

## 2021-08-30 LAB — CK: Total CK: 68 U/L (ref 38–234)

## 2021-08-30 MED ORDER — ACETAMINOPHEN 325 MG PO TABS
650.0000 mg | ORAL_TABLET | Freq: Once | ORAL | Status: AC
  Administered 2021-08-30: 650 mg via ORAL
  Filled 2021-08-30: qty 2

## 2021-08-30 MED ORDER — DEXTROSE 5 % IV SOLN
INTRAVENOUS | Status: DC
  Filled 2021-08-30: qty 250

## 2021-08-30 MED ORDER — IMMUNE GLOBULIN (HUMAN) 20 GM/200ML IV SOLN
500.0000 mg/kg | INTRAVENOUS | Status: AC
  Administered 2021-08-30: 45 g via INTRAVENOUS
  Filled 2021-08-30: qty 450

## 2021-08-30 MED ORDER — METHYLPREDNISOLONE SODIUM SUCC 125 MG IJ SOLR
10.0000 mg | Freq: Once | INTRAMUSCULAR | Status: AC
  Administered 2021-08-30: 10 mg via INTRAVENOUS
  Filled 2021-08-30: qty 2

## 2021-08-30 MED ORDER — DIPHENHYDRAMINE HCL 25 MG PO CAPS
25.0000 mg | ORAL_CAPSULE | Freq: Once | ORAL | Status: AC
  Administered 2021-08-30: 25 mg via ORAL
  Filled 2021-08-30: qty 1

## 2021-08-30 NOTE — Assessment & Plan Note (Addendum)
#   Polymyositis [Dr. [name]; Clinically, she is doing well on IVIG 2 g/kg every 6 weeks; also prednisone 5 mg a day and MTX'10mg'$ SQweekly.; CK is 69 (normal); Aldolase is 3.8  # Continue IVIG every 6 weeks. Continue to decrease steroid premedications. Labs reviewed.  Begin IVIG x4 days beginning today.  # Normocytic anemia: chronic Hb ~9-10. Check irons studies today.    #Urinary incontinence-distal etiology overactive bladder versus others.  Refer to urology as per patient's friends.  # Weight gain: Secondary to steroids.  # Disposition:add iron studies/ferritin/LDH today # refer to urology re: urinary incontinence [needs [place]]  # Proceed with next cycle of IVIG over the next 4 days.  # RTC in 6 weeks for MD assessment, labs (CBC with diff, CMP, CK, aldolase) and IVIG over 4 days- Dr.B.

## 2021-08-30 NOTE — Patient Instructions (Signed)
[facility]  Discharge Instructions: Thank you for choosing [facility] to provide your oncology and hematology care.  If you have a lab appointment with the [facility], please go directly to the [facility] and check in at the registration area.  Wear comfortable clothing and clothing appropriate for easy access to any Portacath or PICC line.   We strive to give you quality time with your provider. You may need to reschedule your appointment if you arrive late (15 or more minutes).  Arriving late affects you and other patients whose appointments are after yours.  Also, if you miss three or more appointments without notifying the office, you may be dismissed from the clinic at the provider's discretion.      For prescription refill requests, have your pharmacy contact our office and allow 72 hours for refills to be completed.    Today you received the following chemotherapy and/or immunotherapy agents IVIG    To help prevent nausea and vomiting after your treatment, we encourage you to take your nausea medication as directed.  BELOW ARE SYMPTOMS THAT SHOULD BE REPORTED IMMEDIATELY: *FEVER GREATER THAN 100.4 F (38 C) OR HIGHER *CHILLS OR SWEATING *NAUSEA AND VOMITING THAT IS NOT CONTROLLED WITH YOUR NAUSEA MEDICATION *UNUSUAL SHORTNESS OF BREATH *UNUSUAL BRUISING OR BLEEDING *URINARY PROBLEMS (pain or burning when urinating, or frequent urination) *BOWEL PROBLEMS (unusual diarrhea, constipation, pain near the anus) TENDERNESS IN MOUTH AND THROAT WITH OR WITHOUT PRESENCE OF ULCERS (sore throat, sores in mouth, or a toothache) UNUSUAL RASH, SWELLING OR PAIN  UNUSUAL VAGINAL DISCHARGE OR ITCHING   Items with * indicate a potential emergency and should be followed up as soon as possible or go to the Emergency Department if any problems should occur.  Please show the CHEMOTHERAPY ALERT CARD or IMMUNOTHERAPY ALERT CARD at check-in to the Emergency  Department and triage nurse.  Should you have questions after your visit or need to cancel or reschedule your appointment, please contact [facility]  [phone] and follow the prompts.  Office hours are 8:00 a.m. to 4:30 p.m. Monday - Friday. Please note that voicemails left after 4:00 p.m. may not be returned until the following business day.  We are closed weekends and major holidays. You have access to a nurse at all times for urgent questions. Please call the main number to the clinic [phone] and follow the prompts.  For any non-urgent questions, you may also contact your provider using MyChart. We now offer e-Visits for anyone 37 and older to request care online for non-urgent symptoms. For details visit [email or web address].   Also download the MyChart app! Go to the app store, search "MyChart", open the app, select [facility], and log in with your MyChart username and password.  Due to Covid, a mask is required upon entering the hospital/clinic. If you do not have a mask, one will be given to you upon arrival. For doctor visits, patients may have 1 support person aged 66 or older with them. For treatment visits, patients cannot have anyone with them due to current Covid guidelines and our immunocompromised population.

## 2021-08-30 NOTE — Progress Notes (Signed)
[facility] CONSULT NOTE  Patient Care Team: [name], MD as PCP - General [name] Susann Givens., MD (Rheumatology) Lequita Asal, MD (Inactive) as Referring Physician (Hematology and Oncology)  CHIEF COMPLAINTS/PURPOSE OF CONSULTATION: Polymyositis/IVIG infusions  #Polymyositis [Dr. [name]; 2019; left arm biopsy]; low dose prednisone/methotrexate/IVIG infusions 400 mg kilogram daily x4 days every 6 weeks  Oncology History   No history exists.     HISTORY OF PRESENTING ILLNESS: Alone.  Walks independently. Bridget Romero 75 y.o.  female with a history of polymyositis is here for follow-up/proceed with IVIG infusions.  No worsening weakness in the legs or arms.  No falls.  No headaches.  No diarrhea.  Denies any blood in stools or black-colored stools.  Complains of urinary incontinence.  Denies any foul-smelling urine.  Denies any back pain.  Review of Systems  Constitutional:  Positive for malaise/fatigue. Negative for chills, diaphoresis, fever and weight loss.  HENT:  Negative for nosebleeds and sore throat.   Eyes:  Negative for double vision.  Respiratory:  Negative for cough, hemoptysis, sputum production, shortness of breath and wheezing.   Cardiovascular:  Negative for chest pain, palpitations, orthopnea and leg swelling.  Gastrointestinal:  Negative for abdominal pain, blood in stool, constipation, diarrhea, heartburn, melena, nausea and vomiting.  Genitourinary:  Negative for dysuria, frequency and urgency.  Musculoskeletal:  Negative for back pain and joint pain.  Skin: Negative.  Negative for itching and rash.  Neurological:  Negative for dizziness, tingling, focal weakness, weakness and headaches.  Endo/Heme/Allergies:  Does not bruise/bleed easily.  Psychiatric/Behavioral:  Negative for depression. The patient is not nervous/anxious and does not have insomnia.     MEDICAL HISTORY:  Past Medical History:  Diagnosis Date    Allergy    Asthma    Diabetes mellitus    Hyperlipidemia    Hypertension    Neuromuscular disorder ([place])    polymyositis   Osteoarthritis    Vitamin B12 deficiency     SURGICAL HISTORY: Past Surgical History:  Procedure Laterality Date   COLONOSCOPY WITH PROPOFOL N/A 03/07/2016   Procedure: COLONOSCOPY WITH PROPOFOL;  Surgeon: Lollie Sails, MD;  Location: [facility] ENDOSCOPY;  Service: Endoscopy;  Laterality: N/A;    SOCIAL HISTORY: Social History   Socioeconomic History   Marital status: Widowed    Spouse name: Not on file   Number of children: 2   Years of education: Not on file   Highest education level: Not on file  Occupational History   Occupation: Retired as Training and development officer at [employer]:    Tobacco Use   Smoking status: Former   Smokeless tobacco: Never   Tobacco comments:    age 21 when stopped smoking  Vaping Use   Vaping Use: Never used  Substance and Sexual Activity   Alcohol use: No   Drug use: No   Sexual activity: Not on file  Other Topics Concern   Not on file  Social History Narrative   has living will   Son and daughter should make health care decisions for her   Would accept resuscitation   Not sure about tube feeds   Social Determinants of Health   Financial Resource Strain: Not on file  Food Insecurity: Not on file  Transportation Needs: Not on file  Physical Activity: Not on file  Stress: Not on file  Social Connections: Not on file  Intimate Partner Violence: Not on file    FAMILY HISTORY: Family  History  Problem Relation Age of Onset   Heart disease Mother    Cancer Father    Breast cancer Neg Hx     ALLERGIES:  is allergic to atorvastatin and pravastatin.  MEDICATIONS:  Current Outpatient Medications  Medication Sig Dispense Refill   Accu-Chek FastClix Lancets MISC USE TO TEST BLOOD SUGAR TWICE DAILY DX E11.40 102 each 11   acetaminophen (TYLENOL) 500 MG tablet Take 500 mg by mouth every 6 (six) hours  as needed.     Alcohol Swabs (B-D SINGLE USE SWABS REGULAR) PADS Use to test blood sugar once daily dx: 250.00 100 each 3   B-D TB SYRINGE 1CC/25GX5/8" 25G X 5/8" 1 ML MISC      Blood Glucose Calibration (ACCU-CHEK AVIVA) SOLN Use as directed 1 each 3   Cholecalciferol (VITAMIN D) 1000 UNITS capsule Take 1,000 Units by mouth daily.     diclofenac Sodium (VOLTAREN) 1 % GEL Apply 4 g topically 4 (four) times daily. 123XX123 g 1   folic acid (FOLVITE) 1 MG tablet Take 1 mg by mouth daily.     gabapentin (NEURONTIN) 300 MG capsule Take 1 capsule by mouth 3 (three) times daily.     glucose blood (ACCU-CHEK SMARTVIEW) test strip USE AS DIRECTED TO CHECK BLOOD SUGAR TWICE DAILY 100 each 6   LANTUS 100 UNIT/ML injection INJECT 10 TO 20 UNITS INTO THE SKIN DAILY AS DIRECTED 10 mL 5   lisinopril (ZESTRIL) 10 MG tablet TAKE 1 TABLET BY MOUTH ONCE A DAY 90 tablet 1   magnesium citrate SOLN Take by mouth.     metFORMIN (GLUCOPHAGE) 1000 MG tablet TAKE 1 TABLET BY MOUTH TWICE A DAY 180 tablet 3   methotrexate 250 MG/10ML injection Inject into the skin.      Multiple Vitamin (MULTIVITAMIN) tablet Take 1 tablet by mouth daily.     nystatin cream (MYCOSTATIN) Apply topically 2 (two) times daily.     predniSONE (DELTASONE) 10 MG tablet Take 5 mg by mouth daily.     No current facility-administered medications for this visit.   Facility-Administered Medications Ordered in Other Visits  Medication Dose Route Frequency Provider Last Rate Last Admin   dextrose 5 % solution   Intravenous Continuous Cammie Sickle, MD 10 mL/hr at 08/30/21 0922 New Bag at 08/30/21 0922   Immune Globulin 10% (PRIVIGEN) IV infusion 45 g  500 mg/kg Intravenous Q24H Cammie Sickle, MD 204 mL/hr at 08/30/21 1057 Rate Change at 08/30/21 1057      .  PHYSICAL EXAMINATION: ECOG PERFORMANCE STATUS: 0 - Asymptomatic  Vitals:   08/30/21 0833  BP: (!) 146/70  Pulse: 69  Resp: 18  Temp: (!) 97.2 F (36.2 C)  SpO2: 100%    Filed Weights   08/30/21 0833  Weight: 202 lb 7.9 oz (91.8 kg)    Physical Exam Vitals and nursing note reviewed.  Constitutional:      Comments:    HENT:     Head: Normocephalic and atraumatic.     Mouth/Throat:     Pharynx: Oropharynx is clear.  Eyes:     Extraocular Movements: Extraocular movements intact.     Pupils: Pupils are equal, round, and reactive to light.  Cardiovascular:     Rate and Rhythm: Normal rate and regular rhythm.  Pulmonary:     Comments: Decreased breath sounds bilaterally.  Abdominal:     Palpations: Abdomen is soft.  Musculoskeletal:        General: Normal range of  motion.     Cervical back: Normal range of motion.  Skin:    General: Skin is warm.  Neurological:     General: No focal deficit present.     Mental Status: She is alert and oriented to person, place, and time.  Psychiatric:        Behavior: Behavior normal.        Judgment: Judgment normal.    LABORATORY DATA:  I have reviewed the data as listed Lab Results  Component Value Date   WBC 4.7 08/30/2021   HGB 9.6 (L) 08/30/2021   HCT 29.7 (L) 08/30/2021   MCV 90.5 08/30/2021   PLT 284 08/30/2021   Recent Labs    09/27/20 0844 11/08/20 0825 06/07/21 0811 07/19/21 0829 08/30/21 0822  NA 139   < > 138 137 138  K 3.6   < > 3.7 4.0 3.9  CL 102   < > 106 106 103  CO2 28   < > '26 24 29  '$ GLUCOSE 189*   < > 149* 190* 135*  BUN 19   < > 22 25* 18  CREATININE 0.96   < > 0.79 0.76 0.73  CALCIUM 9.4   < > 9.4 9.6 9.2  GFRNONAA 59*   < > >60 >60 >60  GFRAA >60  --   --   --   --   PROT 6.9   < > 7.1 7.0 7.1  ALBUMIN 3.6   < > 3.6 3.5 3.5  AST 36   < > '22 20 18  '$ ALT 44   < > '12 13 12  '$ ALKPHOS 43   < > 43 44 47  BILITOT 1.0   < > 0.9 0.6 0.6   < > = values in this interval not displayed.    RADIOGRAPHIC STUDIES: I have personally reviewed the radiological images as listed and agreed with the findings in the report. No results found.  ASSESSMENT & PLAN:    Polymyositis, organ involvement unspecified ([place]) # Polymyositis [Dr. [name]; Clinically, she is doing well on IVIG 2 g/kg every 6 weeks; also prednisone 5 mg a day and MTX 10 mg SQ weekly.; CK is 69 (normal); Aldolase is 3.8  # Continue IVIG every 6 weeks. Continue to decrease steroid premedications. Labs reviewed.  Begin IVIG x4 days beginning today.  # Normocytic anemia: chronic Hb ~9-10. Check irons studies today.    #Urinary incontinence-distal etiology overactive bladder versus others.  Refer to urology as per patient's friends.   # Weight gain: Secondary to steroids.   # Disposition:add iron studies/ferritin/LDH today # refer to urology re: urinary incontinence [needs [place]]  # Proceed with next cycle of IVIG over the next 4 days.  # RTC in 6 weeks for MD assessment, labs (CBC with diff, CMP, CK, aldolase) and IVIG over 4 days- Dr.B.  All questions were answered. The patient knows to call the clinic with any problems, questions or concerns.    Cammie Sickle, MD 08/30/2021 1:13 PM

## 2021-08-31 ENCOUNTER — Encounter: Payer: Self-pay | Admitting: Internal Medicine

## 2021-08-31 ENCOUNTER — Inpatient Hospital Stay: Payer: Medicare PPO

## 2021-08-31 VITALS — BP 160/83 | HR 72 | Temp 96.6°F | Resp 18

## 2021-08-31 DIAGNOSIS — Z79899 Other long term (current) drug therapy: Secondary | ICD-10-CM | POA: Diagnosis not present

## 2021-08-31 DIAGNOSIS — M3320 Polymyositis, organ involvement unspecified: Secondary | ICD-10-CM | POA: Diagnosis not present

## 2021-08-31 DIAGNOSIS — D649 Anemia, unspecified: Secondary | ICD-10-CM | POA: Diagnosis not present

## 2021-08-31 LAB — ALDOLASE: Aldolase: 3.8 U/L (ref 3.3–10.3)

## 2021-08-31 MED ORDER — DIPHENHYDRAMINE HCL 25 MG PO CAPS
25.0000 mg | ORAL_CAPSULE | Freq: Once | ORAL | Status: AC
  Administered 2021-08-31: 25 mg via ORAL
  Filled 2021-08-31: qty 1

## 2021-08-31 MED ORDER — METHYLPREDNISOLONE SODIUM SUCC 125 MG IJ SOLR
10.0000 mg | Freq: Once | INTRAMUSCULAR | Status: AC
  Administered 2021-08-31: 10 mg via INTRAVENOUS
  Filled 2021-08-31: qty 2

## 2021-08-31 MED ORDER — DEXTROSE 5 % IV SOLN
Freq: Once | INTRAVENOUS | Status: AC
  Filled 2021-08-31: qty 250

## 2021-08-31 MED ORDER — ACETAMINOPHEN 325 MG PO TABS
650.0000 mg | ORAL_TABLET | Freq: Once | ORAL | Status: AC
  Administered 2021-08-31: 650 mg via ORAL
  Filled 2021-08-31: qty 2

## 2021-08-31 MED ORDER — IMMUNE GLOBULIN (HUMAN) 20 GM/200ML IV SOLN
500.0000 mg/kg | INTRAVENOUS | Status: DC
  Administered 2021-08-31: 45 g via INTRAVENOUS
  Filled 2021-08-31: qty 450

## 2021-08-31 NOTE — Patient Instructions (Signed)
[facility]  Discharge Instructions: Thank you for choosing [facility] to provide your oncology and hematology care.  If you have a lab appointment with the Cancer Center, please go directly to the Cancer Center and check in at the registration area.  Wear comfortable clothing and clothing appropriate for easy access to any Portacath or PICC line.   We strive to give you quality time with your provider. You may need to reschedule your appointment if you arrive late (15 or more minutes).  Arriving late affects you and other patients whose appointments are after yours.  Also, if you miss three or more appointments without notifying the office, you may be dismissed from the clinic at the provider's discretion.      For prescription refill requests, have your pharmacy contact our office and allow 72 hours for refills to be completed.        To help prevent nausea and vomiting after your treatment, we encourage you to take your nausea medication as directed.  BELOW ARE SYMPTOMS THAT SHOULD BE REPORTED IMMEDIATELY: *FEVER GREATER THAN 100.4 F (38 C) OR HIGHER *CHILLS OR SWEATING *NAUSEA AND VOMITING THAT IS NOT CONTROLLED WITH YOUR NAUSEA MEDICATION *UNUSUAL SHORTNESS OF BREATH *UNUSUAL BRUISING OR BLEEDING *URINARY PROBLEMS (pain or burning when urinating, or frequent urination) *BOWEL PROBLEMS (unusual diarrhea, constipation, pain near the anus) TENDERNESS IN MOUTH AND THROAT WITH OR WITHOUT PRESENCE OF ULCERS (sore throat, sores in mouth, or a toothache) UNUSUAL RASH, SWELLING OR PAIN  UNUSUAL VAGINAL DISCHARGE OR ITCHING   Items with * indicate a potential emergency and should be followed up as soon as possible or go to the Emergency Department if any problems should occur.  Please show the CHEMOTHERAPY ALERT CARD or IMMUNOTHERAPY ALERT CARD at check-in to the Emergency Department and triage nurse.  Should you have questions after your visit or  need to cancel or reschedule your appointment, please contact [facility]  [phone] and follow the prompts.  Office hours are 8:00 a.m. to 4:30 p.m. Monday - Friday. Please note that voicemails left after 4:00 p.m. may not be returned until the following business day.  We are closed weekends and major holidays. You have access to a nurse at all times for urgent questions. Please call the main number to the clinic [phone] and follow the prompts.  For any non-urgent questions, you may also contact your provider using MyChart. We now offer e-Visits for anyone 18 and older to request care online for non-urgent symptoms. For details visit [email or web address].   Also download the MyChart app! Go to the app store, search "MyChart", open the app, select [facility], and log in with your MyChart username and password.  Due to Covid, a mask is required upon entering the hospital/clinic. If you do not have a mask, one will be given to you upon arrival. For doctor visits, patients may have 1 support person aged 18 or older with them. For treatment visits, patients cannot have anyone with them due to current Covid guidelines and our immunocompromised population.  

## 2021-09-01 ENCOUNTER — Inpatient Hospital Stay: Payer: Medicare PPO

## 2021-09-01 ENCOUNTER — Other Ambulatory Visit: Payer: Self-pay

## 2021-09-01 VITALS — BP 163/83 | HR 68 | Temp 97.4°F | Resp 18

## 2021-09-01 DIAGNOSIS — M3320 Polymyositis, organ involvement unspecified: Secondary | ICD-10-CM

## 2021-09-01 DIAGNOSIS — D649 Anemia, unspecified: Secondary | ICD-10-CM | POA: Diagnosis not present

## 2021-09-01 DIAGNOSIS — Z79899 Other long term (current) drug therapy: Secondary | ICD-10-CM | POA: Diagnosis not present

## 2021-09-01 MED ORDER — METHYLPREDNISOLONE SODIUM SUCC 125 MG IJ SOLR
10.0000 mg | Freq: Once | INTRAMUSCULAR | Status: AC
  Administered 2021-09-01: 10 mg via INTRAVENOUS
  Filled 2021-09-01: qty 2

## 2021-09-01 MED ORDER — DEXTROSE 5 % IV SOLN
INTRAVENOUS | Status: DC
  Filled 2021-09-01: qty 250

## 2021-09-01 MED ORDER — ACETAMINOPHEN 325 MG PO TABS
650.0000 mg | ORAL_TABLET | Freq: Once | ORAL | Status: AC
  Administered 2021-09-01: 650 mg via ORAL
  Filled 2021-09-01: qty 2

## 2021-09-01 MED ORDER — IMMUNE GLOBULIN (HUMAN) 20 GM/200ML IV SOLN
500.0000 mg/kg | INTRAVENOUS | Status: DC
  Administered 2021-09-01: 45 g via INTRAVENOUS
  Filled 2021-09-01: qty 450

## 2021-09-01 MED ORDER — DIPHENHYDRAMINE HCL 25 MG PO CAPS
25.0000 mg | ORAL_CAPSULE | Freq: Once | ORAL | Status: AC
  Administered 2021-09-01: 25 mg via ORAL
  Filled 2021-09-01: qty 1

## 2021-09-02 ENCOUNTER — Inpatient Hospital Stay: Payer: Medicare PPO

## 2021-09-02 VITALS — BP 163/79 | HR 72 | Temp 97.2°F | Resp 18

## 2021-09-02 DIAGNOSIS — M3320 Polymyositis, organ involvement unspecified: Secondary | ICD-10-CM | POA: Diagnosis not present

## 2021-09-02 DIAGNOSIS — Z79899 Other long term (current) drug therapy: Secondary | ICD-10-CM | POA: Diagnosis not present

## 2021-09-02 DIAGNOSIS — D649 Anemia, unspecified: Secondary | ICD-10-CM | POA: Diagnosis not present

## 2021-09-02 MED ORDER — METHYLPREDNISOLONE SODIUM SUCC 125 MG IJ SOLR
10.0000 mg | Freq: Once | INTRAMUSCULAR | Status: AC
  Administered 2021-09-02: 10 mg via INTRAVENOUS
  Filled 2021-09-02: qty 2

## 2021-09-02 MED ORDER — DIPHENHYDRAMINE HCL 25 MG PO CAPS
25.0000 mg | ORAL_CAPSULE | Freq: Once | ORAL | Status: AC
  Administered 2021-09-02: 25 mg via ORAL
  Filled 2021-09-02: qty 1

## 2021-09-02 MED ORDER — DEXTROSE 5 % IV SOLN
INTRAVENOUS | Status: DC
  Filled 2021-09-02: qty 250

## 2021-09-02 MED ORDER — ACETAMINOPHEN 325 MG PO TABS
650.0000 mg | ORAL_TABLET | Freq: Once | ORAL | Status: AC
  Administered 2021-09-02: 650 mg via ORAL
  Filled 2021-09-02: qty 2

## 2021-09-02 MED ORDER — IMMUNE GLOBULIN (HUMAN) 20 GM/200ML IV SOLN
500.0000 mg/kg | INTRAVENOUS | Status: DC
  Administered 2021-09-02: 45 g via INTRAVENOUS
  Filled 2021-09-02: qty 450

## 2021-09-08 ENCOUNTER — Emergency Department
Admission: EM | Admit: 2021-09-08 | Discharge: 2021-09-08 | Disposition: A | Discharge status: Home / Self Care | Payer: Medicare PPO | Attending: Emergency Medicine | Admitting: Emergency Medicine

## 2021-09-08 ENCOUNTER — Telehealth: Payer: Self-pay

## 2021-09-08 ENCOUNTER — Other Ambulatory Visit: Payer: Self-pay

## 2021-09-08 ENCOUNTER — Emergency Department: Payer: Medicare PPO

## 2021-09-08 DIAGNOSIS — E1140 Type 2 diabetes mellitus with diabetic neuropathy, unspecified: Secondary | ICD-10-CM | POA: Diagnosis not present

## 2021-09-08 DIAGNOSIS — Z043 Encounter for examination and observation following other accident: Secondary | ICD-10-CM | POA: Diagnosis not present

## 2021-09-08 DIAGNOSIS — I10 Essential (primary) hypertension: Secondary | ICD-10-CM | POA: Insufficient documentation

## 2021-09-08 DIAGNOSIS — J45909 Unspecified asthma, uncomplicated: Secondary | ICD-10-CM | POA: Diagnosis not present

## 2021-09-08 DIAGNOSIS — Z79899 Other long term (current) drug therapy: Secondary | ICD-10-CM | POA: Diagnosis not present

## 2021-09-08 DIAGNOSIS — R42 Dizziness and giddiness: Secondary | ICD-10-CM | POA: Diagnosis not present

## 2021-09-08 DIAGNOSIS — Z794 Long term (current) use of insulin: Secondary | ICD-10-CM | POA: Insufficient documentation

## 2021-09-08 DIAGNOSIS — Z87891 Personal history of nicotine dependence: Secondary | ICD-10-CM | POA: Diagnosis not present

## 2021-09-08 DIAGNOSIS — R55 Syncope and collapse: Secondary | ICD-10-CM | POA: Insufficient documentation

## 2021-09-08 DIAGNOSIS — Z7984 Long term (current) use of oral hypoglycemic drugs: Secondary | ICD-10-CM | POA: Insufficient documentation

## 2021-09-08 LAB — BASIC METABOLIC PANEL
Anion gap: 6 (ref 5–15)
BUN: 20 mg/dL (ref 8–23)
CO2: 26 mmol/L (ref 22–32)
Calcium: 9.3 mg/dL (ref 8.9–10.3)
Chloride: 104 mmol/L (ref 98–111)
Creatinine, Ser: 0.85 mg/dL (ref 0.44–1.00)
GFR, Estimated: >60 mL/min (ref >60)
Glucose, Bld: 196 mg/dL — ABNORMAL HIGH (ref 70–99)
Potassium: 4.2 mmol/L (ref 3.5–5.1)
Sodium: 136 mmol/L (ref 135–145)

## 2021-09-08 LAB — CBC
HCT: 31.2 % — ABNORMAL LOW (ref 36.0–46.0)
Hemoglobin: 10 g/dL — ABNORMAL LOW (ref 12.0–15.0)
MCH: 30.1 pg (ref 26.0–34.0)
MCHC: 32.1 g/dL (ref 30.0–36.0)
MCV: 94 fL (ref 80.0–100.0)
Platelets: 315 10*3/uL (ref 150–400)
RBC: 3.32 MIL/uL — ABNORMAL LOW (ref 3.87–5.11)
RDW: 14.4 % (ref 11.5–15.5)
WBC: 5.7 10*3/uL (ref 4.0–10.5)
nRBC: 0 % (ref 0.0–0.2)

## 2021-09-08 LAB — TROPONIN I (HIGH SENSITIVITY)
Troponin I (High Sensitivity): 12 ng/L (ref <18)
Troponin I (High Sensitivity): 12 ng/L (ref <18)

## 2021-09-08 NOTE — ED Triage Notes (Signed)
Pt to ER via POV, reports at 1am this morning she was using the bathroom when she suddenly got dizzy and fell off the toilet, reports feeling hot and becoming diaphoretic. Thinks she may have lost consciousness. Unsure if hit head. Denies blood thinner usage. Was able to crawl to her bed. Reports she has been having sinus pain and pressure and has been taking over the counter medications for over a week.   At this time reports a headache, describes it as tight and on the right side.

## 2021-09-08 NOTE — Telephone Encounter (Signed)
I spoke with pt; pt said at 1 AM this morning BP 150/90 BS 162 and at 8 AM FBS was 142.when pt got up at 1 AM to use the rest room to urinate pt said was sitting on commode, pt broke out in a sweat and she looked like she had just gotten out of shower with her hair so wet; pt felt dizzy and had a "funny feeling that went thru her chest; pt could not further explain the feeling she had in her chest; pt said it was not pain but she was not sure how to describe the feeling; was not pressure or tightness. Then pt fell off commode and hit her head; pt does not think she was "out for very long" and then pt crawled back to the bed. Now pt is not having any H/A,dizziness, CP or SOB. Pt said she can walk, talk and grasp a cup but pt does have a funny feeling on rt side of face; pt was like a tightness on rt side of face and pts eye also feel tight and tired. Pt cannot take her BP now cause no one there to help her.pt said she can get some one to take her to [facility] ED and get eval and any possible testing. Sending note to DR Silvio Pate and Larene Beach CMA. Sending teams to [facility] CMA.

## 2021-09-08 NOTE — ED Provider Notes (Signed)
[facility] Emergency Department Provider Note  ____________________________________________   Event Date/Time   First MD Initiated Contact with Patient 09/08/21 2057     (approximate)  I have reviewed the triage vital signs and the nursing notes.   HISTORY  Chief Complaint Loss of Consciousness    HPI Bridget Romero is a 75 y.o. female with hypertension, hyperlipidemia who reports that at 1 AM this morning she was using the bathroom.  She states that she is overactive bladder and that she was just urinating when all of a sudden she felt this hot feeling spread up from her chest and she felt sweaty and she states that she fell off the toilet.  Does not think she lost consciousness but thinks she might of hit her head.  Patient was able to crawl to her bed.  She states that she feels like her normal self now and reports the headache has since resolved that she had in triage.  Originally on the right side felt like a tightness.  She states that she feels like her normal self at this time. No chest pain or sob at this time.           Past Medical History:  Diagnosis Date   Allergy    Asthma    Diabetes mellitus    Hyperlipidemia    Hypertension    Neuromuscular disorder ([place])    polymyositis   Osteoarthritis    Vitamin B12 deficiency     Patient Active Problem List   Diagnosis Date Noted   Temporomandibular joint (TMJ) pain 04/23/2020   Normocytic anemia 11/25/2019   Polymyositis, organ involvement unspecified ([place]) 07/02/2017   Vitamin B12 deficiency    Essential hypertension 03/01/2016   Right-sided low back pain without sciatica 12/22/2015   Nocturia 01/28/2015   Advanced directives, counseling/discussion 12/09/2014   Routine general medical examination at a health care facility 09/22/2011   Hyperlipidemia    Sleep disturbance 09/08/2009   NECK PAIN, CHRONIC 07/06/2009   Osteoarthritis, multiple sites 03/04/2008   Controlled type 2  diabetes mellitus with diabetic neuropathy ([place]) 08/22/2007   ALLERGIC RHINITIS 08/22/2007   Allergic asthma 08/22/2007    Past Surgical History:  Procedure Laterality Date   COLONOSCOPY WITH PROPOFOL N/A 03/07/2016   Procedure: COLONOSCOPY WITH PROPOFOL;  Surgeon: Lollie Sails, MD;  Location: [facility] ENDOSCOPY;  Service: Endoscopy;  Laterality: N/A;    Prior to Admission medications   Medication Sig Start Date End Date Taking? Authorizing Provider  Accu-Chek FastClix Lancets MISC USE TO TEST BLOOD SUGAR TWICE DAILY DX E11.40 01/20/21   Viviana Simpler I, MD  acetaminophen (TYLENOL) 500 MG tablet Take 500 mg by mouth every 6 (six) hours as needed.    [provider]  Alcohol Swabs (B-D SINGLE USE SWABS REGULAR) PADS Use to test blood sugar once daily dx: 250.00 01/16/14   [name], MD  B-D TB SYRINGE 1CC/25GX5/8" 25G X 5/8" 1 ML MISC  04/27/20   [provider]  Blood Glucose Calibration (ACCU-CHEK AVIVA) SOLN Use as directed 01/16/20   [name], MD  Cholecalciferol (VITAMIN D) 1000 UNITS capsule Take 1,000 Units by mouth daily.    [provider]  diclofenac Sodium (VOLTAREN) 1 % GEL Apply 4 g topically 4 (four) times daily. 06/07/21   Jacquelin Hawking, NP  folic acid (FOLVITE) 1 MG tablet Take 1 mg by mouth daily.    [provider]  gabapentin (NEURONTIN) 300 MG capsule  Take 1 capsule by mouth 3 (three) times daily. 07/11/21   [provider]  glucose blood (ACCU-CHEK SMARTVIEW) test strip USE AS DIRECTED TO CHECK BLOOD SUGAR TWICE DAILY 03/02/21   Viviana Simpler I, MD  LANTUS 100 UNIT/ML injection INJECT 10 TO 20 UNITS INTO THE SKIN DAILY AS DIRECTED 07/29/21   Viviana Simpler I, MD  lisinopril (ZESTRIL) 10 MG tablet TAKE 1 TABLET BY MOUTH ONCE A DAY 08/26/21   [name], MD  magnesium citrate SOLN Take by mouth.    [provider]  metFORMIN (GLUCOPHAGE) 1000 MG tablet TAKE 1 TABLET BY MOUTH TWICE A DAY 10/27/20    Viviana Simpler I, MD  methotrexate 250 MG/10ML injection Inject into the skin.  03/24/20   [provider]  Multiple Vitamin (MULTIVITAMIN) tablet Take 1 tablet by mouth daily.    [provider]  nystatin cream (MYCOSTATIN) Apply topically 2 (two) times daily. 12/30/20 12/30/21  [provider]  predniSONE (DELTASONE) 10 MG tablet Take 5 mg by mouth daily. 12/08/19   [provider]    Allergies Atorvastatin and Pravastatin  Family History  Problem Relation Age of Onset   Heart disease Mother    Cancer Father    Breast cancer Neg Hx     Social History Social History   Tobacco Use   Smoking status: Former   Smokeless tobacco: Never   Tobacco comments:    age 78 when stopped smoking  Vaping Use   Vaping Use: Never used  Substance Use Topics   Alcohol use: No   Drug use: No      Review of Systems Constitutional: No fever/chills, sweatiness, dizzy Eyes: No visual changes. ENT: No sore throat. Cardiovascular: Chest tightness Respiratory: Denies shortness of breath. Gastrointestinal: No abdominal pain.  No nausea, no vomiting.  No diarrhea.  No constipation. Genitourinary: Negative for dysuria. Musculoskeletal: Negative for back pain. Skin: Negative for rash. Neurological: Negative for headaches, focal weakness or numbness. All other ROS negative ____________________________________________   PHYSICAL EXAM:  VITAL SIGNS: ED Triage Vitals [09/08/21 1557]  Enc Vitals Group     BP (!) 143/74     Pulse Rate 79     Resp 18     Temp 98 F (36.7 C)     Temp Source Oral     SpO2 95 %     Weight 200 lb (90.7 kg)     Height '5\' 6"'$  (1.676 m)     Head Circumference      Peak Flow      Pain Score 2     Pain Loc      Pain Edu?      Excl. in [place]?     Constitutional: Alert and oriented. Well appearing and in no acute distress. Eyes: Conjunctivae are normal. EOMI. Head: Atraumatic. Nose: No congestion/rhinnorhea. Mouth/Throat: Mucous  membranes are moist.   Neck: No stridor. Trachea Midline. FROM.  No C-spine tenderness Cardiovascular: Normal rate, regular rhythm. Grossly normal heart sounds.  Good peripheral circulation. Respiratory: Normal respiratory effort.  No retractions. Lungs CTAB. Gastrointestinal: Soft and nontender. No distention. No abdominal bruits.  Musculoskeletal: No lower extremity tenderness nor edema.  No joint effusions. Neurologic:  Normal speech and language. No gross focal neurologic deficits are appreciated.  Finger-nose intact.  Equal strength in arms and legs.  Cranial nerves appear intact. Skin:  Skin is warm, dry and intact. No rash noted. Psychiatric: Mood and affect are normal. Speech and behavior are normal. GU:  Deferred   ____________________________________________   LABS (all labs ordered are listed, but only abnormal results are displayed)  Labs Reviewed  BASIC METABOLIC PANEL - Abnormal; Notable for the following components:      Result Value   Glucose, Bld 196 (*)    All other components within normal limits  CBC - Abnormal; Notable for the following components:   RBC 3.32 (*)    Hemoglobin 10.0 (*)    HCT 31.2 (*)    All other components within normal limits  URINALYSIS, COMPLETE (UACMP) WITH MICROSCOPIC  CBG MONITORING, ED  TROPONIN I (HIGH SENSITIVITY)  TROPONIN I (HIGH SENSITIVITY)   ____________________________________________   ED ECG REPORT I, Vanessa [place], the attending physician, personally viewed and interpreted this ECG.  Normal sinus rate of 77, no ST elevation, no T wave versions, normal intervals  ____________________________________________  RADIOLOGY  Official radiology report(s): CT HEAD WO CONTRAST  Result Date: 09/08/2021 CLINICAL DATA:  Post fall EXAM: CT HEAD WITHOUT CONTRAST TECHNIQUE: Contiguous axial images were obtained from the base of the skull through the vertex without intravenous contrast. COMPARISON:  CT head 05/26/2011 FINDINGS:  Brain: No evidence of large-territorial acute infarction. No parenchymal hemorrhage. No mass lesion. No extra-axial collection. No mass effect or midline shift. No hydrocephalus. Basilar cisterns are patent. Vascular: No hyperdense vessel. Skull: No acute fracture or focal lesion. Sinuses/Orbits: Paranasal sinuses and mastoid air cells are clear. The orbits are unremarkable. Other: None. IMPRESSION: No acute intracranial abnormality. Electronically Signed   By: Iven Finn M.D.   On: 09/08/2021 16:40    ____________________________________________   PROCEDURES  Procedure(s) performed (including Critical Care):  .1-3 Lead EKG Interpretation Performed by: Vanessa [place], MD Authorized by: Vanessa [place], MD     Interpretation: normal     ECG rate:  60s   ECG rate assessment: normal     Rhythm: sinus rhythm     Ectopy: none     Conduction: normal     ____________________________________________   INITIAL IMPRESSION / ASSESSMENT AND PLAN / ED COURSE  ADRIANE ROBLEZ was evaluated in Emergency Department on 09/08/2021 for the symptoms described in the history of present illness. She was evaluated in the context of the global COVID-19 pandemic, which necessitated consideration that the patient might be at risk for infection with the SARS-CoV-2 virus that causes COVID-19. Institutional protocols and algorithms that pertain to the evaluation of patients at risk for COVID-19 are in a state of rapid change based on information released by regulatory bodies including the CDC and federal and state organizations. These policies and algorithms were followed during the patient's care in the ED.    Patient comes in with some dizziness and chest hottness that occurred at 1 AM.  Patient denies any symptoms at this time.  CT head ordered in triage is without evidence of intercranial hemorrhage.  Labs without any electrolyte abnormalities or AKI.  Cardiac markers were ordered without any evidence of ACS.   Patient's been on the cardiac monitor without any arrhythmia.  She is been here for over 8 hours without any recurrent symptoms.  She is ambulated to the bathroom.  She has no C-spine tenderness to suggest cervical injuries.  She denies any other extremity injuries to suggest other fractures.  Do not think this is a posterior stroke given no dizziness at this time.  Most likely vaso vagal response.  We discussed admission versus going home for syncopal work-up and patient feels comfortable going home given  she feels at her baseline self at this time.  We will have her Give her cardiology for full up and she did not give a urine sample but denies any urinary symptoms at this time does not want to stay to give a urine sample  I discussed the provisional nature of ED diagnosis, the treatment so far, the ongoing plan of care, follow up appointments and return precautions with the patient and any family or support people present. They expressed understanding and agreed with the plan, discharged home.          ____________________________________________   FINAL CLINICAL IMPRESSION(S) / ED DIAGNOSES   Final diagnoses:  Vasovagal near-syncope      MEDICATIONS GIVEN DURING THIS VISIT:  Medications - No data to display   ED Discharge Orders     None        Note:  This document was prepared using Dragon voice recognition software and may include unintentional dictation errors.    Vanessa Lindsay, MD 09/08/21 [phone]

## 2021-09-08 NOTE — Telephone Encounter (Signed)
[facility] - Client TELEPHONE ADVICE RECORD [facility] Patient Name: Bridget Romero Gender: Female DOB: 30-Apr-1946 Age: 75 Y 54 M 28 D Return Phone Number: [phone] (Primary) Address: City/ State/ Zip: [place] [address] Client [name] - Client Client Site [place] - Day Physician Viviana Simpler- MD Contact Type Call Who Is Calling Patient / Member / Family / Caregiver Call Type Triage / Clinical Relationship To Patient Self Return Phone Number [phone] (Primary) Chief Complaint Dizziness Reason for Call Symptomatic / Request for Health Information Initial Comment Caller states this morning she started sweating and got dizzy on the commode, and she fell off, leg pain. Feeling weak. Translation No Nurse Assessment Nurse: Sumner Boast, RN, En[place] Derry Date/Time Eilene Ghazi Time): 09/08/2021 9:01:52 AM Confirm and document reason for call. If symptomatic, describe symptoms. ---Caller states this morning she started sweating and got dizzy on the commode, and she fell off. No injury. No pain. Feeling weak. Has Hypertension. B/P 150/92. Able to walk. No vision changes. States she has been taking sinus medication. Does the patient have any new or worsening symptoms? ---Yes Will a triage be completed? ---Yes Related visit to physician within the last 2 weeks? ---No Does the PT have any chronic conditions? (i.e. diabetes, asthma, this includes High risk factors for pregnancy, etc.) ---Yes List chronic conditions. ---Polymylocystic Disease Diabetes, Type I Hypertension Is this a behavioral health or substance abuse call? ---No Guidelines Guideline Title Affirmed Question Affirmed Notes Nurse Date/Time (Eastern Time) Dizziness - Vertigo [1] NO dizziness now AND [2] one or more stroke risk factors (i.e., hypertension, diabetes, prior stroke/ TIA/heart attack) Sumner Boast, RN, Enid Derry  09/08/2021 9:09:28 AM PLEASE NOTE: All timestamps contained within this report are represented as [place] Standard Time. CONFIDENTIALTY NOTICE: This fax transmission is intended only for the addressee. It contains information that is legally privileged, confidential or otherwise protected from use or disclosure. If you are not the intended recipient, you are strictly prohibited from reviewing, disclosing, copying using or disseminating any of this information or taking any action in reliance on or regarding this information. If you have received this fax in error, please notify us immediately by telephone so that we can arrange for its return to [place]. Phone: [phone], Toll-Free: [phone], Fax: [phone] Page: 2 of 2 Call Id: [id number] [place]. Time Eilene Ghazi Time) Disposition Final User 09/08/2021 9:13:33 AM See PCP within 24 Hours Yes Sumner Boast, RN, Teola Bradley Disagree/Comply Comply Caller Understands Yes PreDisposition Call Doctor Care Advice Given Per Guideline SEE PCP WITHIN 24 HOURS: CALL BACK IF: * Dizziness becomes constant * You become worse CARE ADVICE given per Dizziness - Vertigo (Adult) guideline. Comments User: Baruch Goldmann Date/Time Eilene Ghazi Time): 09/08/2021 8:44:29 AM Transferred from the office. Referrals REFERRED TO PCP OFFICE

## 2021-09-08 NOTE — ED Notes (Signed)
BGL not performed due to BMP already done and resulted. MD aware

## 2021-09-08 NOTE — ED Notes (Signed)
Encouraged pt to provide urine sample and assisted to the commode by another RN. Unable to urinate at this time.

## 2021-09-09 NOTE — Telephone Encounter (Signed)
Spoke to pt. She is feeling better. She did go to the ER and they called it a vaso reaction.

## 2021-09-14 ENCOUNTER — Other Ambulatory Visit: Payer: Self-pay

## 2021-09-14 ENCOUNTER — Ambulatory Visit: Payer: Medicare PPO | Admitting: Urology

## 2021-09-14 ENCOUNTER — Encounter: Payer: Self-pay | Admitting: Urology

## 2021-09-14 ENCOUNTER — Telehealth: Payer: Self-pay | Admitting: *Deleted

## 2021-09-14 VITALS — BP 190/76 | HR 120 | Ht 66.0 in | Wt 200.0 lb

## 2021-09-14 DIAGNOSIS — R351 Nocturia: Secondary | ICD-10-CM

## 2021-09-14 DIAGNOSIS — R350 Frequency of micturition: Secondary | ICD-10-CM | POA: Diagnosis not present

## 2021-09-14 LAB — BLADDER SCAN AMB NON-IMAGING: Scan Result: 53

## 2021-09-14 MED ORDER — TROSPIUM CHLORIDE 20 MG PO TABS: ORAL_TABLET | ORAL | 0 refills | Status: DC

## 2021-09-14 NOTE — Telephone Encounter (Signed)
PLEASE NOTE: All timestamps contained within this report are represented as [place] Standard Time. CONFIDENTIALTY NOTICE: This fax transmission is intended only for the addressee. It contains information that is legally privileged, confidential or otherwise protected from use or disclosure. If you are not the intended recipient, you are strictly prohibited from reviewing, disclosing, copying using or disseminating any of this information or taking any action in reliance on or regarding this information. If you have received this fax in error, please notify us immediately by telephone so that we can arrange for its return to [place]. Phone: [phone], Toll-Free: [phone], Fax: [phone] Page: 1 of 2 Call Id: [id number] [facility] - Client TELEPHONE ADVICE RECORD [facility] Patient Name: Bridget Romero Gender: Female DOB: 1946-12-19 Age: 75 Y 12 M 3 D Return Phone Number: [phone] (Primary), [phone] (Secondary) Address: City/ State/ Zip: [place] [facility] - Client Client Site [place] - Day Physician Viviana Simpler- MD Contact Type Call Who Is Calling Patient / Member / Family / Caregiver Call Type Triage / Clinical Relationship To Patient Self Return Phone Number [phone] (Secondary) Chief Complaint Blood Pressure High Reason for Call Symptomatic / Request for [place] states her BP was 190/70. Translation No Nurse Assessment Nurse: Leilani Merl, RN, Heather Date/Time (Eastern Time): 09/14/2021 1:07:54 PM Confirm and document reason for call. If symptomatic, describe symptoms. ---Caller states her BP was 190/70, it was taken at a doctor's office this morning. Does the patient have any new or worsening symptoms? ---Yes Will a triage be completed? ---Yes Related visit to physician within the last 2 weeks? ---No Does the PT have any  chronic conditions? (i.e. diabetes, asthma, this includes High risk factors for pregnancy, etc.) ---Yes List chronic conditions. ---HTN Is this a behavioral health or substance abuse call? ---No Guidelines Guideline Title Affirmed Question Affirmed Notes Nurse Date/Time (Eastern Time) Blood Pressure - High Systolic BP >= 811 OR Diastolic >= 572 Standifer, RN, Heather 09/14/2021 1:08:29 PM Disp. Time Eilene Ghazi Time) Disposition Final User 09/14/2021 1:12:42 PM SEE PCP WITHIN 3 DAYS Yes Standifer, RN, Banker Disagree/Comply Comply Caller Understands Yes PLEASE NOTE: All timestamps contained within this report are represented as [place] Standard Time. CONFIDENTIALTY NOTICE: This fax transmission is intended only for the addressee. It contains information that is legally privileged, confidential or otherwise protected from use or disclosure. If you are not the intended recipient, you are strictly prohibited from reviewing, disclosing, copying using or disseminating any of this information or taking any action in reliance on or regarding this information. If you have received this fax in error, please notify us immediately by telephone so that we can arrange for its return to [place]. Phone: [phone], Toll-Free: [phone], Fax: [phone] Page: 2 of 2 Call Id: [id number] PreDisposition Call Doctor Care Advice Given Per Guideline SEE PCP WITHIN 3 DAYS: * You need to be seen within 2 or 3 days. CARE ADVICE given per High Blood Pressure (Adult) guideline. Referrals REFERRED TO PCP OFFICE

## 2021-09-14 NOTE — Telephone Encounter (Addendum)
Spoke to patient by telephone and was advised that her blood pressure was 190/70 at another doctor's appointment today.Patient stated that they checked her blood pressure with a manual cuff. Patient stated that she has felt a little pressure on the right side of her head but that is much better now. Patient denies a headache, numbness, tingling, chest pain, blurred vision or any other symptoms. 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 Patient was given ER precautions and she verbalized understanding. Patient was advised that I can get her in with a providers at [facility] tomorrow which she declined stating that it is too far to drive. Patient stated that she will plan on going to [facility] when she finished babysitting around 4:30 if she is still feeling any pressure. Patient was advised that she really needs to have her blood pressure checked to see what it is now. 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 Again patient was given ER precautions and she verbalized understanding.

## 2021-09-15 LAB — URINALYSIS, COMPLETE
Bilirubin, UA: NEGATIVE
Glucose, UA: NEGATIVE
Ketones, UA: NEGATIVE
Leukocytes,UA: NEGATIVE
Nitrite, UA: NEGATIVE
Protein,UA: NEGATIVE
Specific Gravity, UA: <1.005 — ABNORMAL LOW (ref 1.005–1.030)
Urobilinogen, Ur: 0.2 mg/dL (ref 0.2–1.0)
pH, UA: 5.5 (ref 5.0–7.5)

## 2021-09-15 LAB — MICROSCOPIC EXAMINATION: Epithelial Cells (non renal): NONE SEEN /hpf (ref 0–10)

## 2021-09-15 NOTE — Progress Notes (Signed)
09/14/2021 6:33 AM   Jon Billings [birth year] [id number]  Referring provider: [name], MD [address] [place],  [place] [address]  Chief Complaint  Patient presents with   Urinary Frequency    HPI: Bridget Romero is a 75 y.o. female self-referred for evaluation of nocturia.  6 month history nocturia x2-3 No bothersome daytime symptoms Denies urge or urge incontinence Denies history sleep apnea/snoring Nighttime voided volumes are equivalent to her daytime volumes She has limited p.m. fluid intake Saw Dr. Silvio Pate March 2022 and decreasing sodium intake and compression stockings were discussed as well as taking gabapentin at night   PMH: Past Medical History:  Diagnosis Date   Allergy    Asthma    Diabetes mellitus    Hyperlipidemia    Hypertension    Neuromuscular disorder ([place])    polymyositis   Osteoarthritis    Vitamin B12 deficiency     Surgical History: Past Surgical History:  Procedure Laterality Date   COLONOSCOPY WITH PROPOFOL N/A 03/07/2016   Procedure: COLONOSCOPY WITH PROPOFOL;  Surgeon: Lollie Sails, MD;  [facility]: [facility] ENDOSCOPY;  Service: Endoscopy;  Laterality: N/A;    Home Medications:  Allergies as of 09/14/2021       Reactions   Atorvastatin Other (See Comments)   myalgias   Pravastatin Other (See Comments)   Muscle pain        Medication List        Accurate as of September 14, 2021 11:59 PM. If you have any questions, ask your nurse or doctor.          Accu-Chek Aviva Soln Use as directed   Accu-Chek FastClix Lancets Misc USE TO TEST BLOOD SUGAR TWICE DAILY DX E11.40   Accu-Chek SmartView test strip Generic drug: glucose blood USE AS DIRECTED TO CHECK BLOOD SUGAR TWICE DAILY   acetaminophen 500 MG tablet Commonly known as: TYLENOL Take 500 mg by mouth every 6 (six) hours as needed.   B-D SINGLE USE SWABS REGULAR Pads Use to test blood sugar once daily dx: 250.00   B-D TB SYRINGE  1CC/25GX5/8" 25G X 5/8" 1 ML Misc Generic drug: TUBERCULIN SYR 1CC/25GX5/8"   diclofenac Sodium 1 % Gel Commonly known as: Voltaren Apply 4 g topically 4 (four) times daily.   folic acid 1 MG tablet Commonly known as: FOLVITE Take 1 mg by mouth daily.   gabapentin 300 MG capsule Commonly known as: NEURONTIN Take 1 capsule by mouth 3 (three) times daily.   Lantus 100 UNIT/ML injection Generic drug: insulin glargine INJECT 10 TO 20 UNITS INTO THE SKIN DAILY AS DIRECTED   lisinopril 10 MG tablet Commonly known as: ZESTRIL TAKE 1 TABLET BY MOUTH ONCE A DAY   magnesium citrate Soln Take by mouth.   metFORMIN 1000 MG tablet Commonly known as: GLUCOPHAGE TAKE 1 TABLET BY MOUTH TWICE A DAY   methotrexate 250 MG/10ML injection Inject into the skin.   multivitamin tablet Take 1 tablet by mouth daily.   nystatin cream Commonly known as: MYCOSTATIN Apply topically 2 (two) times daily.   predniSONE 10 MG tablet Commonly known as: DELTASONE Take 5 mg by mouth daily.   predniSONE 2.5 MG tablet Commonly known as: DELTASONE Take 2.5 mg by mouth daily.   trospium 20 MG tablet Commonly known as: SANCTURA Take 1 tab 1 hour prior to bedtime Started by: Abbie Sons, MD   Vitamin D 1000 units capsule Take 1,000 Units by mouth daily.  Allergies:  Allergies  Allergen Reactions   Atorvastatin Other (See Comments)    myalgias   Pravastatin Other (See Comments)    Muscle pain    Family History: Family History  Problem Relation Age of Onset   Heart disease Mother    Cancer Father    Breast cancer Neg Hx     Social History:  reports that she has quit smoking. She has never used smokeless tobacco. She reports that she does not drink alcohol and does not use drugs.   Physical Exam: BP (!) 190/76   Pulse (!) 120   Ht 5\' 6"  (1.676 m)   Wt 200 lb (90.7 kg)   BMI 32.28 kg/m   Constitutional:  Alert and oriented, No acute distress. HEENT: [place] AT, moist  mucus membranes.  Trachea midline, no masses. Cardiovascular: No clubbing, cyanosis, or edema. Respiratory: Normal respiratory effort, no increased work of breathing. GI: Abdomen is soft, nontender, nondistended, no abdominal masses Neurologic: Grossly intact, no focal deficits, moving all 4 extremities. Psychiatric: Normal mood and affect.   Assessment & Plan:    1.  Nocturia Bladder scan PVR 53 mL Urinalysis was unremarkable We discussed the multiple etiologies of nocturia and that this symptom is sometimes difficult to treat Overactive bladder, nocturnal polyuria and sleep apnea or all discussed She states her symptom is bothersome enough that she would desire management.  Initial trial trospium 20 mg 1 hour prior to bedtime If this is not effective would consider sleep study as [place] unlikely covered by insurance   Abbie Sons, [place] [address], [place], [place] [address] [phone]

## 2021-09-15 NOTE — Telephone Encounter (Signed)
I was unable to reach pt by phone; home phone rang but no answer and no v/m. I spoke with Becky Sax (DPR signed) and was advised to call [phone] but I did not get answer but left v/m requesting pt to cb for appt. See Dr Silvio Pate notes.

## 2021-09-19 NOTE — Telephone Encounter (Signed)
She has an appt tomorrow.  

## 2021-09-20 ENCOUNTER — Encounter: Payer: Self-pay | Admitting: Urology

## 2021-09-20 ENCOUNTER — Other Ambulatory Visit: Payer: Self-pay

## 2021-09-20 ENCOUNTER — Ambulatory Visit: Payer: Medicare PPO | Admitting: Internal Medicine

## 2021-09-20 ENCOUNTER — Encounter: Payer: Self-pay | Admitting: Internal Medicine

## 2021-09-20 VITALS — BP 130/86 | HR 77 | Temp 97.4°F | Ht 66.0 in | Wt 198.0 lb

## 2021-09-20 DIAGNOSIS — Z23 Encounter for immunization: Secondary | ICD-10-CM

## 2021-09-20 DIAGNOSIS — I10 Essential (primary) hypertension: Secondary | ICD-10-CM | POA: Diagnosis not present

## 2021-09-20 NOTE — Patient Instructions (Signed)
Please check your blood pressure once or twice a week. Let me know if it is persistently over 140/90 ---and I will increase the dose of your lisinopril.

## 2021-09-20 NOTE — Assessment & Plan Note (Signed)
BP Readings from Last 3 Encounters:  09/20/21 130/86  09/14/21 (!) 190/76  09/08/21 (!) 164/73   Repeat on right 136/76 She got good number herself  Will have her check BP at home once or twice a week If persistently elevated there--would increase the lisinopril to 20mg 

## 2021-09-20 NOTE — Progress Notes (Signed)
Subjective:    Patient ID: Bridget Romero, female    DOB: 08/06/1946, 75 y.o.   MRN: [id number]  HPI Here due to concerns about her blood pressure This visit occurred during the SARS-CoV-2 public health emergency.  Safety protocols were in place, including screening questions prior to the visit, additional usage of staff PPE, and extensive cleaning of exam room while observing appropriate contact time as indicated for disinfecting solutions.   BP pretty high at urologist recently No headache or dizziness Did have some brief tight feeling around right temple Now starting to check her own BP---this AM 134/75  Current Outpatient Medications on File Prior to Visit  Medication Sig Dispense Refill   Accu-Chek FastClix Lancets MISC USE TO TEST BLOOD SUGAR TWICE DAILY DX E11.40 102 each 11   acetaminophen (TYLENOL) 500 MG tablet Take 500 mg by mouth every 6 (six) hours as needed.     Alcohol Swabs (B-D SINGLE USE SWABS REGULAR) PADS Use to test blood sugar once daily dx: 250.00 100 each 3   B-D TB SYRINGE 1CC/25GX5/8" 25G X 5/8" 1 ML MISC      Blood Glucose Calibration (ACCU-CHEK AVIVA) SOLN Use as directed 1 each 3   Cholecalciferol (VITAMIN D) 1000 UNITS capsule Take 1,000 Units by mouth daily.     folic acid (FOLVITE) 1 MG tablet Take 1 mg by mouth daily.     gabapentin (NEURONTIN) 300 MG capsule Take 1 capsule by mouth 3 (three) times daily.     glucose blood (ACCU-CHEK SMARTVIEW) test strip USE AS DIRECTED TO CHECK BLOOD SUGAR TWICE DAILY 100 each 6   LANTUS 100 UNIT/ML injection INJECT 10 TO 20 UNITS INTO THE SKIN DAILY AS DIRECTED 10 mL 5   lisinopril (ZESTRIL) 10 MG tablet TAKE 1 TABLET BY MOUTH ONCE A DAY 90 tablet 1   magnesium citrate SOLN Take by mouth.     metFORMIN (GLUCOPHAGE) 1000 MG tablet TAKE 1 TABLET BY MOUTH TWICE A DAY 180 tablet 3   methotrexate 250 MG/10ML injection Inject into the skin.      Multiple Vitamin (MULTIVITAMIN) tablet Take 1 tablet by mouth daily.      predniSONE (DELTASONE) 2.5 MG tablet Take 2.5 mg by mouth daily.     trospium (SANCTURA) 20 MG tablet Take 1 tab 1 hour prior to bedtime 30 tablet 0   Current Facility-Administered Medications on File Prior to Visit  Medication Dose Route Frequency Provider Last Rate Last Admin   dextrose 5 % solution   Intravenous Continuous Cammie Sickle, MD   Stopped at 08/30/21 1350    Allergies  Allergen Reactions   Atorvastatin Other (See Comments)    myalgias   Pravastatin Other (See Comments)    Muscle pain    Past Medical History:  Diagnosis Date   Allergy    Asthma    Diabetes mellitus    Hyperlipidemia    Hypertension    Neuromuscular disorder ([place])    polymyositis   Osteoarthritis    Vitamin B12 deficiency     Past Surgical History:  Procedure Laterality Date   COLONOSCOPY WITH PROPOFOL N/A 03/07/2016   Procedure: COLONOSCOPY WITH PROPOFOL;  Surgeon: Lollie Sails, MD;  Location: [facility] ENDOSCOPY;  Service: Endoscopy;  Laterality: N/A;    Family History  Problem Relation Age of Onset   Heart disease Mother    Cancer Father    Breast cancer Neg Hx     Social History   Socioeconomic History  Marital status: Widowed    Spouse name: Not on file   Number of children: 2   Years of education: Not on file   Highest education level: Not on file  Occupational History   Occupation: Retired as Training and development officer at [employer]:    Tobacco Use   Smoking status: Former   Smokeless tobacco: Never   Tobacco comments:    age 87 when stopped smoking  Vaping Use   Vaping Use: Never used  Substance and Sexual Activity   Alcohol use: No   Drug use: No   Sexual activity: Not on file  Other Topics Concern   Not on file  Social History Narrative   has living will   Son and daughter should make health care decisions for her   Would accept resuscitation   Not sure about tube feeds   Social Determinants of Health   Financial Resource Strain: Not on  file  Food Insecurity: Not on file  Transportation Needs: Not on file  Physical Activity: Not on file  Stress: Not on file  Social Connections: Not on file  Intimate Partner Violence: Not on file   Review of Systems No orthostatic symptoms Otherwise feels okay Trying to cut back on salt     Objective:   Physical Exam Constitutional:      Appearance: Normal appearance.  Cardiovascular:     Rate and Rhythm: Normal rate and regular rhythm.     Pulses: Normal pulses.     Heart sounds: No murmur heard.   No gallop.  Pulmonary:     Effort: Pulmonary effort is normal.     Breath sounds: Normal breath sounds. No wheezing or rales.  Musculoskeletal:     Cervical back: Neck supple.  Lymphadenopathy:     Cervical: No cervical adenopathy.  Neurological:     Mental Status: She is alert.           Assessment & Plan:

## 2021-09-20 NOTE — Addendum Note (Signed)
Addended by: Pilar Grammes on: 09/20/2021 05:06 PM   Modules accepted: Orders

## 2021-09-23 DIAGNOSIS — R55 Syncope and collapse: Secondary | ICD-10-CM | POA: Diagnosis not present

## 2021-09-23 DIAGNOSIS — E119 Type 2 diabetes mellitus without complications: Secondary | ICD-10-CM | POA: Diagnosis not present

## 2021-09-23 DIAGNOSIS — R079 Chest pain, unspecified: Secondary | ICD-10-CM | POA: Diagnosis not present

## 2021-09-23 DIAGNOSIS — E782 Mixed hyperlipidemia: Secondary | ICD-10-CM | POA: Diagnosis not present

## 2021-09-23 DIAGNOSIS — R9431 Abnormal electrocardiogram [ECG] [EKG]: Secondary | ICD-10-CM | POA: Diagnosis not present

## 2021-09-23 DIAGNOSIS — I10 Essential (primary) hypertension: Secondary | ICD-10-CM | POA: Diagnosis not present

## 2021-09-29 ENCOUNTER — Telehealth: Payer: Self-pay | Admitting: Internal Medicine

## 2021-09-29 NOTE — Telephone Encounter (Signed)
Tried to call pt back. No Answer and No VM available.

## 2021-09-29 NOTE — Telephone Encounter (Signed)
Pt called in want to know if she needs to get the  Nuclear stress test done . Please advise #[phone] or [phone]

## 2021-09-30 NOTE — Telephone Encounter (Signed)
2nd attempt to call pt. No answer and no VM.

## 2021-10-04 DIAGNOSIS — R079 Chest pain, unspecified: Secondary | ICD-10-CM | POA: Diagnosis not present

## 2021-10-04 NOTE — Telephone Encounter (Signed)
Mailed letter to pt

## 2021-10-04 NOTE — Telephone Encounter (Signed)
3rd attempt to call pt back. No answer. No VM.

## 2021-10-05 DIAGNOSIS — R55 Syncope and collapse: Secondary | ICD-10-CM | POA: Diagnosis not present

## 2021-10-11 ENCOUNTER — Encounter: Payer: Self-pay | Admitting: Internal Medicine

## 2021-10-11 ENCOUNTER — Inpatient Hospital Stay: Payer: Medicare PPO | Attending: Internal Medicine

## 2021-10-11 ENCOUNTER — Inpatient Hospital Stay: Payer: Medicare PPO

## 2021-10-11 ENCOUNTER — Inpatient Hospital Stay (HOSPITAL_BASED_OUTPATIENT_CLINIC_OR_DEPARTMENT_OTHER): Payer: Medicare PPO | Admitting: Internal Medicine

## 2021-10-11 ENCOUNTER — Other Ambulatory Visit: Payer: Self-pay

## 2021-10-11 VITALS — BP 148/77 | HR 70

## 2021-10-11 DIAGNOSIS — M3320 Polymyositis, organ involvement unspecified: Secondary | ICD-10-CM

## 2021-10-11 LAB — CBC WITH DIFFERENTIAL/PLATELET
Abs Immature Granulocytes: 0.01 10*3/uL (ref 0.00–0.07)
Basophils Absolute: 0 10*3/uL (ref 0.0–0.1)
Basophils Relative: 1 %
Eosinophils Absolute: 0.2 10*3/uL (ref 0.0–0.5)
Eosinophils Relative: 4 %
HCT: 31.4 % — ABNORMAL LOW (ref 36.0–46.0)
Hemoglobin: 9.8 g/dL — ABNORMAL LOW (ref 12.0–15.0)
Immature Granulocytes: 0 %
Lymphocytes Relative: 30 %
Lymphs Abs: 1.6 10*3/uL (ref 0.7–4.0)
MCH: 29.4 pg (ref 26.0–34.0)
MCHC: 31.2 g/dL (ref 30.0–36.0)
MCV: 94.3 fL (ref 80.0–100.0)
Monocytes Absolute: 0.3 10*3/uL (ref 0.1–1.0)
Monocytes Relative: 6 %
Neutro Abs: 3.2 10*3/uL (ref 1.7–7.7)
Neutrophils Relative %: 59 %
Platelets: 265 10*3/uL (ref 150–400)
RBC: 3.33 MIL/uL — ABNORMAL LOW (ref 3.87–5.11)
RDW: 14 % (ref 11.5–15.5)
WBC: 5.3 10*3/uL (ref 4.0–10.5)
nRBC: 0 % (ref 0.0–0.2)

## 2021-10-11 LAB — COMPREHENSIVE METABOLIC PANEL
ALT: 11 U/L (ref 0–44)
AST: 17 U/L (ref 15–41)
Albumin: 3.7 g/dL (ref 3.5–5.0)
Alkaline Phosphatase: 51 U/L (ref 38–126)
Anion gap: 5 (ref 5–15)
BUN: 23 mg/dL (ref 8–23)
CO2: 27 mmol/L (ref 22–32)
Calcium: 9.5 mg/dL (ref 8.9–10.3)
Chloride: 105 mmol/L (ref 98–111)
Creatinine, Ser: 0.8 mg/dL (ref 0.44–1.00)
GFR, Estimated: >60 mL/min (ref >60)
Glucose, Bld: 176 mg/dL — ABNORMAL HIGH (ref 70–99)
Potassium: 4.2 mmol/L (ref 3.5–5.1)
Sodium: 137 mmol/L (ref 135–145)
Total Bilirubin: 0.8 mg/dL (ref 0.3–1.2)
Total Protein: 7.1 g/dL (ref 6.5–8.1)

## 2021-10-11 LAB — CK: Total CK: 67 U/L (ref 38–234)

## 2021-10-11 MED ORDER — DEXTROSE 5 % IV SOLN
INTRAVENOUS | Status: DC
  Filled 2021-10-11: qty 250

## 2021-10-11 MED ORDER — IMMUNE GLOBULIN (HUMAN) 5 GM/50ML IV SOLN
45.0000 g | INTRAVENOUS | Status: DC
  Administered 2021-10-11: 45 g via INTRAVENOUS
  Filled 2021-10-11: qty 50

## 2021-10-11 MED ORDER — ACETAMINOPHEN 325 MG PO TABS
650.0000 mg | ORAL_TABLET | Freq: Once | ORAL | Status: AC
  Administered 2021-10-11: 650 mg via ORAL
  Filled 2021-10-11: qty 2

## 2021-10-11 MED ORDER — METHYLPREDNISOLONE SODIUM SUCC 40 MG IJ SOLR
10.0000 mg | Freq: Once | INTRAMUSCULAR | Status: AC
  Administered 2021-10-11: 10 mg via INTRAVENOUS
  Filled 2021-10-11: qty 1

## 2021-10-11 MED ORDER — DIPHENHYDRAMINE HCL 25 MG PO CAPS
25.0000 mg | ORAL_CAPSULE | Freq: Once | ORAL | Status: AC
  Administered 2021-10-11: 25 mg via ORAL
  Filled 2021-10-11: qty 1

## 2021-10-11 NOTE — Progress Notes (Signed)
Bridget Romero CONSULT NOTE  Patient Care Team: [name], MD as PCP - General [name] Susann Givens., MD (Rheumatology)  CHIEF COMPLAINTS/PURPOSE OF CONSULTATION: Polymyositis/IVIG infusions  #Polymyositis [Dr. [name]; 2019; left arm biopsy]; low dose prednisone/methotrexate/IVIG infusions 400 mg kilogram daily x4 days every 6 weeks  Oncology History   No history exists.     HISTORY OF PRESENTING ILLNESS: Alone.  Walks independently.  Bridget Romero 75 y.o.  female with a history of polymyositis is here for follow-up/proceed with IVIG infusions.  S/p evaluation with urology for urinary continence.  Improved.  Denies any worsening weakness in the legs or arms.  No headaches.  No falls.  No diarrhea.   Review of Systems  Constitutional:  Positive for malaise/fatigue. Negative for chills, diaphoresis, fever and weight loss.  HENT:  Negative for nosebleeds and sore throat.   Eyes:  Negative for double vision.  Respiratory:  Negative for cough, hemoptysis, sputum production, shortness of breath and wheezing.   Cardiovascular:  Negative for chest pain, palpitations, orthopnea and leg swelling.  Gastrointestinal:  Negative for abdominal pain, blood in stool, constipation, diarrhea, heartburn, melena, nausea and vomiting.  Genitourinary:  Negative for dysuria, frequency and urgency.  Musculoskeletal:  Negative for back pain and joint pain.  Skin: Negative.  Negative for itching and rash.  Neurological:  Negative for dizziness, tingling, focal weakness, weakness and headaches.  Endo/Heme/Allergies:  Does not bruise/bleed easily.  Psychiatric/Behavioral:  Negative for depression. The patient is not nervous/anxious and does not have insomnia.     MEDICAL HISTORY:  Past Medical History:  Diagnosis Date   Allergy    Asthma    Diabetes mellitus    Hyperlipidemia    Hypertension    Neuromuscular disorder ([place])    polymyositis   Osteoarthritis    Vitamin B12  deficiency     SURGICAL HISTORY: Past Surgical History:  Procedure Laterality Date   COLONOSCOPY WITH PROPOFOL N/A 03/07/2016   Procedure: COLONOSCOPY WITH PROPOFOL;  Surgeon: Lollie Sails, MD;  Location: [facility] ENDOSCOPY;  Service: Endoscopy;  Laterality: N/A;    SOCIAL HISTORY: Social History   Socioeconomic History   Marital status: Widowed    Spouse name: Not on file   Number of children: 2   Years of education: Not on file   Highest education level: Not on file  Occupational History   Occupation: Retired as Training and development officer at [employer]:    Tobacco Use   Smoking status: Former   Smokeless tobacco: Never   Tobacco comments:    age 72 when stopped smoking  Vaping Use   Vaping Use: Never used  Substance and Sexual Activity   Alcohol use: No   Drug use: No   Sexual activity: Not on file  Other Topics Concern   Not on file  Social History Narrative   has living will   Son and daughter should make health care decisions for her   Would accept resuscitation   Not sure about tube feeds   Social Determinants of Health   Financial Resource Strain: Not on file  Food Insecurity: Not on file  Transportation Needs: Not on file  Physical Activity: Not on file  Stress: Not on file  Social Connections: Not on file  Intimate Partner Violence: Not on file    FAMILY HISTORY: Family History  Problem Relation Age of Onset   Heart disease Mother    Cancer Father    Breast cancer  Neg Hx     ALLERGIES:  is allergic to atorvastatin and pravastatin.  MEDICATIONS:  Current Outpatient Medications  Medication Sig Dispense Refill   Accu-Chek FastClix Lancets MISC USE TO TEST BLOOD SUGAR TWICE DAILY DX E11.40 102 each 11   acetaminophen (TYLENOL) 500 MG tablet Take 500 mg by mouth every 6 (six) hours as needed.     Alcohol Swabs (B-D SINGLE USE SWABS REGULAR) PADS Use to test blood sugar once daily dx: 250.00 100 each 3   B-D TB SYRINGE 1CC/25GX5/8" 25G X  5/8" 1 ML MISC      Blood Glucose Calibration (ACCU-CHEK AVIVA) SOLN Use as directed 1 each 3   Cholecalciferol (VITAMIN D) 1000 UNITS capsule Take 1,000 Units by mouth daily.     folic acid (FOLVITE) 1 MG tablet Take 1 mg by mouth daily.     gabapentin (NEURONTIN) 300 MG capsule Take 1 capsule by mouth 3 (three) times daily.     glucose blood (ACCU-CHEK SMARTVIEW) test strip USE AS DIRECTED TO CHECK BLOOD SUGAR TWICE DAILY 100 each 6   LANTUS 100 UNIT/ML injection INJECT 10 TO 20 UNITS INTO THE SKIN DAILY AS DIRECTED 10 mL 5   lisinopril (ZESTRIL) 10 MG tablet TAKE 1 TABLET BY MOUTH ONCE A DAY 90 tablet 1   magnesium citrate SOLN Take 148 mLs by mouth once a week.     metFORMIN (GLUCOPHAGE) 1000 MG tablet TAKE 1 TABLET BY MOUTH TWICE A DAY 180 tablet 3   methotrexate 250 MG/10ML injection Inject into the skin.      Multiple Vitamin (MULTIVITAMIN) tablet Take 1 tablet by mouth daily.     predniSONE (DELTASONE) 2.5 MG tablet Take 2.5 mg by mouth daily.     trospium (SANCTURA) 20 MG tablet Take 1 tab 1 hour prior to bedtime 30 tablet 0   No current facility-administered medications for this visit.   Facility-Administered Medications Ordered in Other Visits  Medication Dose Route Frequency Provider Last Rate Last Admin   dextrose 5 % solution   Intravenous Continuous Cammie Sickle, MD   Stopped at 08/30/21 1350   dextrose 5 % solution   Intravenous Continuous Cammie Sickle, MD 10 mL/hr at 10/11/21 0943 New Bag at 10/11/21 0943   Immune Globulin 10% (PRIVIGEN) 45 g  45 g Intravenous Q24H Cammie Sickle, MD 107 mL/hr at 10/11/21 1033 Rate Change at 10/11/21 1033      .  PHYSICAL EXAMINATION: ECOG PERFORMANCE STATUS: 0 - Asymptomatic  Vitals:   10/11/21 0844  BP: 137/70  Pulse: 60  Resp: 20  Temp: (!) 96.1 F (35.6 C)   Filed Weights   10/11/21 0844  Weight: 197 lb (89.4 kg)    Physical Exam Vitals and nursing note reviewed.  Constitutional:       Comments:    HENT:     Head: Normocephalic and atraumatic.     Mouth/Throat:     Pharynx: Oropharynx is clear.  Eyes:     Extraocular Movements: Extraocular movements intact.     Pupils: Pupils are equal, round, and reactive to light.  Cardiovascular:     Rate and Rhythm: Normal rate and regular rhythm.  Pulmonary:     Comments: Decreased breath sounds bilaterally.  Abdominal:     Palpations: Abdomen is soft.  Musculoskeletal:        General: Normal range of motion.     Cervical back: Normal range of motion.  Skin:    General: Skin is  warm.  Neurological:     General: No focal deficit present.     Mental Status: She is alert and oriented to person, place, and time.  Psychiatric:        Behavior: Behavior normal.        Judgment: Judgment normal.    LABORATORY DATA:  I have reviewed the data as listed Lab Results  Component Value Date   WBC 5.3 10/11/2021   HGB 9.8 (L) 10/11/2021   HCT 31.4 (L) 10/11/2021   MCV 94.3 10/11/2021   PLT 265 10/11/2021   Recent Labs    07/19/21 0829 08/30/21 0822 09/08/21 1600 10/11/21 0813  NA 137 138 136 137  K 4.0 3.9 4.2 4.2  CL 106 103 104 105  CO2 24 29 26 27   GLUCOSE 190* 135* 196* 176*  BUN 25* 18 20 23   CREATININE 0.76 0.73 0.85 0.80  CALCIUM 9.6 9.2 9.3 9.5  GFRNONAA >60 >60 >60 >60  PROT 7.0 7.1  --  7.1  ALBUMIN 3.5 3.5  --  3.7  AST 20 18  --  17  ALT 13 12  --  11  ALKPHOS 44 47  --  51  BILITOT 0.6 0.6  --  0.8    RADIOGRAPHIC STUDIES: I have personally reviewed the radiological images as listed and agreed with the findings in the report. No results found.  ASSESSMENT & PLAN:   Polymyositis, organ involvement unspecified ([place]) # Polymyositis [Dr. [name]; Clinically, she is doing well on IVIG 2 g/kg every 6 weeks; also prednisone 5 mg a day and MTX 10 mg SQ weekly.; CK is 69 (normal); Aldolase is 3.8  # Continue IVIG every 6 weeks.   Begin IVIG x4 days beginning today. STABLE.   # Normocytic anemia:  chronic Hb ~9-10. SEP 2022- iron sat -38%; ferritin- 60- monitor for now.rule out hemolysis.   #Urinary incontinence-distal etiology overactive bladder versus others. S/p evaluation- STABLE.    # Disposition:   # Proceed with next cycle of IVIG over the next 4 days.  # RTC in 6 weeks for NP- assessment, labs (CBC with diff, CMP, CK, aldolase; LDH; haptoglobin) and IVIG over 4 days- Dr.B.  All questions were answered. The patient knows to call the clinic with any problems, questions or concerns.    Cammie Sickle, MD 10/11/2021 11:07 AM

## 2021-10-11 NOTE — Assessment & Plan Note (Addendum)
#   Polymyositis [Dr. [name]; Clinically, she is doing well on IVIG 2 g/kg every 6 weeks; also prednisone 5 mg a day and MTX10mg SQweekly.; CK is 69 (normal); Aldolase is 3.8  # Continue IVIG every 6 weeks.   Begin IVIG x4 days beginning today. STABLE.   # Normocytic anemia: chronic Hb ~9-10. SEP 2022- iron sat -38%; ferritin- 60- monitor for now.rule out hemolysis.   #Urinary incontinence-distal etiology overactive bladder versus others. S/p evaluation- STABLE.   # Disposition:   # Proceed with next cycle of IVIG over the next 4 days.  # RTC in 6 weeks for NP- assessment, labs (CBC with diff, CMP, CK, aldolase; LDH; haptoglobin) and IVIG over 4 days- Dr.B.

## 2021-10-12 ENCOUNTER — Inpatient Hospital Stay: Payer: Medicare PPO

## 2021-10-12 VITALS — BP 144/79 | HR 72 | Temp 96.9°F | Resp 18

## 2021-10-12 DIAGNOSIS — M3320 Polymyositis, organ involvement unspecified: Secondary | ICD-10-CM

## 2021-10-12 LAB — ALDOLASE: Aldolase: 3.3 U/L (ref 3.3–10.3)

## 2021-10-12 MED ORDER — ACETAMINOPHEN 325 MG PO TABS
650.0000 mg | ORAL_TABLET | Freq: Once | ORAL | Status: AC
  Administered 2021-10-12: 650 mg via ORAL
  Filled 2021-10-12: qty 2

## 2021-10-12 MED ORDER — DEXTROSE 5 % IV SOLN
INTRAVENOUS | Status: DC
  Filled 2021-10-12: qty 250

## 2021-10-12 MED ORDER — IMMUNE GLOBULIN (HUMAN) 5 GM/50ML IV SOLN
45.0000 g | INTRAVENOUS | Status: DC
  Administered 2021-10-12: 45 g via INTRAVENOUS
  Filled 2021-10-12: qty 400

## 2021-10-12 MED ORDER — METHYLPREDNISOLONE SODIUM SUCC 40 MG IJ SOLR
10.0000 mg | Freq: Once | INTRAMUSCULAR | Status: DC
  Filled 2021-10-12: qty 1

## 2021-10-12 MED ORDER — DIPHENHYDRAMINE HCL 25 MG PO CAPS
25.0000 mg | ORAL_CAPSULE | Freq: Once | ORAL | Status: AC
  Administered 2021-10-12: 25 mg via ORAL
  Filled 2021-10-12: qty 1

## 2021-10-12 MED ORDER — METHYLPREDNISOLONE SODIUM SUCC 125 MG IJ SOLR
10.0000 mg | Freq: Once | INTRAMUSCULAR | Status: AC
  Administered 2021-10-12: 10 mg via INTRAVENOUS
  Filled 2021-10-12: qty 2

## 2021-10-13 ENCOUNTER — Other Ambulatory Visit: Payer: Self-pay

## 2021-10-13 ENCOUNTER — Inpatient Hospital Stay: Payer: Medicare PPO

## 2021-10-13 VITALS — BP 137/76 | HR 76 | Temp 97.5°F | Resp 18

## 2021-10-13 DIAGNOSIS — M3320 Polymyositis, organ involvement unspecified: Secondary | ICD-10-CM

## 2021-10-13 MED ORDER — DEXTROSE 5 % IV SOLN
INTRAVENOUS | Status: DC
  Filled 2021-10-13: qty 250

## 2021-10-13 MED ORDER — DIPHENHYDRAMINE HCL 25 MG PO CAPS
25.0000 mg | ORAL_CAPSULE | Freq: Once | ORAL | Status: AC
  Administered 2021-10-13: 25 mg via ORAL
  Filled 2021-10-13: qty 1

## 2021-10-13 MED ORDER — IMMUNE GLOBULIN (HUMAN) 5 GM/50ML IV SOLN
45.0000 g | INTRAVENOUS | Status: DC
  Administered 2021-10-13: 45 g via INTRAVENOUS
  Filled 2021-10-13: qty 400

## 2021-10-13 MED ORDER — METHYLPREDNISOLONE SODIUM SUCC 40 MG IJ SOLR
10.0000 mg | Freq: Once | INTRAMUSCULAR | Status: AC
  Administered 2021-10-13: 10 mg via INTRAVENOUS
  Filled 2021-10-13: qty 1

## 2021-10-13 MED ORDER — ACETAMINOPHEN 325 MG PO TABS
650.0000 mg | ORAL_TABLET | Freq: Once | ORAL | Status: AC
  Administered 2021-10-13: 650 mg via ORAL
  Filled 2021-10-13: qty 2

## 2021-10-13 NOTE — Patient Instructions (Signed)
[facility]  Discharge Instructions: Thank you for choosing [facility] to provide your oncology and hematology care.  If you have a lab appointment with the Cancer Center, please go directly to the Cancer Center and check in at the registration area.  Wear comfortable clothing and clothing appropriate for easy access to any Portacath or PICC line.   We strive to give you quality time with your provider. You may need to reschedule your appointment if you arrive late (15 or more minutes).  Arriving late affects you and other patients whose appointments are after yours.  Also, if you miss three or more appointments without notifying the office, you may be dismissed from the clinic at the provider's discretion.      For prescription refill requests, have your pharmacy contact our office and allow 72 hours for refills to be completed.      To help prevent nausea and vomiting after your treatment, we encourage you to take your nausea medication as directed.  BELOW ARE SYMPTOMS THAT SHOULD BE REPORTED IMMEDIATELY: *FEVER GREATER THAN 100.4 F (38 C) OR HIGHER *CHILLS OR SWEATING *NAUSEA AND VOMITING THAT IS NOT CONTROLLED WITH YOUR NAUSEA MEDICATION *UNUSUAL SHORTNESS OF BREATH *UNUSUAL BRUISING OR BLEEDING *URINARY PROBLEMS (pain or burning when urinating, or frequent urination) *BOWEL PROBLEMS (unusual diarrhea, constipation, pain near the anus) TENDERNESS IN MOUTH AND THROAT WITH OR WITHOUT PRESENCE OF ULCERS (sore throat, sores in mouth, or a toothache) UNUSUAL RASH, SWELLING OR PAIN  UNUSUAL VAGINAL DISCHARGE OR ITCHING   Items with * indicate a potential emergency and should be followed up as soon as possible or go to the Emergency Department if any problems should occur.  Please show the CHEMOTHERAPY ALERT CARD or IMMUNOTHERAPY ALERT CARD at check-in to the Emergency Department and triage nurse.  Should you have questions after your  visit or need to cancel or reschedule your appointment, please contact [facility]  [phone] and follow the prompts.  Office hours are 8:00 a.m. to 4:30 p.m. Monday - Friday. Please note that voicemails left after 4:00 p.m. may not be returned until the following business day.  We are closed weekends and major holidays. You have access to a nurse at all times for urgent questions. Please call the main number to the clinic [phone] and follow the prompts.  For any non-urgent questions, you may also contact your provider using MyChart. We now offer e-Visits for anyone 18 and older to request care online for non-urgent symptoms. For details visit [email or web address].   Also download the MyChart app! Go to the app store, search "MyChart", open the app, select [facility], and log in with your MyChart username and password.  Due to Covid, a mask is required upon entering the hospital/clinic. If you do not have a mask, one will be given to you upon arrival. For doctor visits, patients may have 1 support person aged 18 or older with them. For treatment visits, patients cannot have anyone with them due to current Covid guidelines and our immunocompromised population.  

## 2021-10-13 NOTE — Progress Notes (Signed)
Patient received premeds and IVIG as ordered by physician. IVIG was titrated per protocol. Patient tolerated well and discharged to home in stable condition.

## 2021-10-14 ENCOUNTER — Inpatient Hospital Stay: Payer: Medicare PPO

## 2021-10-14 VITALS — BP 158/82 | HR 74 | Temp 96.9°F | Resp 18

## 2021-10-14 DIAGNOSIS — M3320 Polymyositis, organ involvement unspecified: Secondary | ICD-10-CM

## 2021-10-14 MED ORDER — IMMUNE GLOBULIN (HUMAN) 5 GM/100ML IV SOLN
45.0000 g | INTRAVENOUS | Status: DC
  Administered 2021-10-14: 45 g via INTRAVENOUS
  Filled 2021-10-14: qty 400

## 2021-10-14 MED ORDER — DEXTROSE 5 % IV SOLN
INTRAVENOUS | Status: DC
  Filled 2021-10-14: qty 250

## 2021-10-14 MED ORDER — METHYLPREDNISOLONE SODIUM SUCC 40 MG IJ SOLR
10.0000 mg | Freq: Once | INTRAMUSCULAR | Status: AC
  Administered 2021-10-14: 10 mg via INTRAVENOUS
  Filled 2021-10-14: qty 1

## 2021-10-14 MED ORDER — ACETAMINOPHEN 325 MG PO TABS
650.0000 mg | ORAL_TABLET | Freq: Once | ORAL | Status: AC
  Administered 2021-10-14: 650 mg via ORAL
  Filled 2021-10-14: qty 2

## 2021-10-14 MED ORDER — DIPHENHYDRAMINE HCL 25 MG PO CAPS
25.0000 mg | ORAL_CAPSULE | Freq: Once | ORAL | Status: AC
  Administered 2021-10-14: 25 mg via ORAL
  Filled 2021-10-14: qty 1

## 2021-10-18 ENCOUNTER — Telehealth: Payer: Self-pay | Admitting: Internal Medicine

## 2021-10-18 NOTE — Telephone Encounter (Signed)
Pt called wants to know how she can get a BP monitor . Please Advise [phone]

## 2021-10-18 NOTE — Telephone Encounter (Signed)
I spoke to pt and advised that she can buy a cuff anywhere they sell them. She said something about [employer]. I advised her to find out from them if they send one to her or if she needs a rx to get it at a pharmacy.

## 2021-10-19 ENCOUNTER — Other Ambulatory Visit: Payer: Self-pay | Admitting: Internal Medicine

## 2021-10-20 DIAGNOSIS — I340 Nonrheumatic mitral (valve) insufficiency: Secondary | ICD-10-CM | POA: Diagnosis not present

## 2021-10-21 ENCOUNTER — Other Ambulatory Visit: Payer: Self-pay

## 2021-10-21 ENCOUNTER — Ambulatory Visit: Payer: Medicare PPO | Admitting: Physician Assistant

## 2021-10-21 VITALS — BP 163/87 | HR 112 | Ht 66.0 in | Wt 198.0 lb

## 2021-10-21 DIAGNOSIS — R351 Nocturia: Secondary | ICD-10-CM | POA: Diagnosis not present

## 2021-10-21 LAB — BLADDER SCAN AMB NON-IMAGING: Scan Result: 58

## 2021-10-21 MED ORDER — TROSPIUM CHLORIDE 20 MG PO TABS: ORAL_TABLET | ORAL | 11 refills | Status: DC

## 2021-10-21 NOTE — Progress Notes (Signed)
10/21/2021 11:25 AM   Bridget Romero 1946/08/07 [id number]  CC: Chief Complaint  Patient presents with   Other   HPI: Bridget Romero is a 75 y.o. female with PMH nocturia x2-3 who presents today for symptom recheck on trospium 20 mg nightly.   Today she reports nocturia x2 on trospium.  She is pleased with her progress.  She denies constipation, dry mouth, dry eye on this medication.  She is unsure she wants to take the medication every day long-term.  PVR 79mL.  PMH: Past Medical History:  Diagnosis Date   Allergy    Asthma    Diabetes mellitus    Hyperlipidemia    Hypertension    Neuromuscular disorder ([place])    polymyositis   Osteoarthritis    Vitamin B12 deficiency     Surgical History: Past Surgical History:  Procedure Laterality Date   COLONOSCOPY WITH PROPOFOL N/A 03/07/2016   Procedure: COLONOSCOPY WITH PROPOFOL;  Surgeon: Lollie Sails, MD;  Location: [facility] ENDOSCOPY;  Service: Endoscopy;  Laterality: N/A;    Home Medications:  Allergies as of 10/21/2021       Reactions   Atorvastatin Other (See Comments)   myalgias   Pravastatin Other (See Comments)   Muscle pain        Medication List        Accurate as of October 21, 2021 11:25 AM. If you have any questions, ask your nurse or doctor.          Accu-Chek Aviva Soln Use as directed   Accu-Chek FastClix Lancets Misc USE TO TEST BLOOD SUGAR TWICE DAILY DX E11.40   Accu-Chek SmartView test strip Generic drug: glucose blood USE AS DIRECTED TO CHECK BLOOD SUGAR TWICE DAILY   acetaminophen 500 MG tablet Commonly known as: TYLENOL Take 500 mg by mouth every 6 (six) hours as needed.   B-D SINGLE USE SWABS REGULAR Pads Use to test blood sugar once daily dx: 250.00   B-D TB SYRINGE 1CC/25GX5/8" 25G X 5/8" 1 ML Misc Generic drug: TUBERCULIN SYR 8NO/67EH2/0"   folic acid 1 MG tablet Commonly known as: FOLVITE Take 1 mg by mouth daily.   gabapentin 300 MG capsule Commonly known  as: NEURONTIN Take 1 capsule by mouth 3 (three) times daily.   Lantus 100 UNIT/ML injection Generic drug: insulin glargine INJECT 10 TO 20 UNITS INTO THE SKIN DAILY AS DIRECTED   lisinopril 10 MG tablet Commonly known as: ZESTRIL TAKE 1 TABLET BY MOUTH ONCE A DAY   magnesium citrate Soln Take 148 mLs by mouth once a week.   metFORMIN 1000 MG tablet Commonly known as: GLUCOPHAGE TAKE 1 TABLET BY MOUTH TWICE A DAY   methotrexate 250 MG/10ML injection Inject into the skin.   multivitamin tablet Take 1 tablet by mouth daily.   predniSONE 2.5 MG tablet Commonly known as: DELTASONE Take 2.5 mg by mouth daily.   trospium 20 MG tablet Commonly known as: SANCTURA Take 1 tab 1 hour prior to bedtime   Vitamin D 1000 units capsule Take 1,000 Units by mouth daily.        Allergies:  Allergies  Allergen Reactions   Atorvastatin Other (See Comments)    myalgias   Pravastatin Other (See Comments)    Muscle pain    Family History: Family History  Problem Relation Age of Onset   Heart disease Mother    Cancer Father    Breast cancer Neg Hx     Social History:  reports that she has quit smoking. She has never used smokeless tobacco. She reports that she does not drink alcohol and does not use drugs.  Physical Exam: BP (!) 163/87   Pulse (!) 112   Ht 5\' 6"  (1.676 m)   Wt 198 lb (89.8 kg)   BMI 31.96 kg/m   Constitutional:  Alert and oriented, no acute distress, nontoxic appearing HEENT: [place], AT Cardiovascular: No clubbing, cyanosis, or edema Respiratory: Normal respiratory effort, no increased work of breathing Skin: No rashes, bruises or suspicious lesions Neurologic: Grossly intact, no focal deficits, moving all 4 extremities Psychiatric: Normal mood and affect  Laboratory Data: Results for orders placed or performed in visit on 10/21/21  Bladder Scan (Post Void Residual) in office  Result Value Ref Range   Scan Result 58    Assessment & Plan:   1.  Nocturia Improved on nighttime trospium.  Patient is unsure if she wishes to continue this long-term.  I am refilling this for her if she chooses to continue it.  We discussed that if she is taking the medication continuously, I will want to see her in clinic annually for routine follow-up, otherwise she may follow-up as needed.  We discussed behavioral modifications for nighttime urination including reducing fluids and diuretics after dinner, voiding before bed, and elevating the feet in the afternoon. - Bladder Scan (Post Void Residual) in office - trospium (SANCTURA) 20 MG tablet; Take 1 tab 1 hour prior to bedtime  Dispense: 30 tablet; Refill: 11  Return if symptoms worsen or fail to improve.  Debroah Loop, PA-C  [facility] [address] [place], [place] [address] [phone]

## 2021-10-25 ENCOUNTER — Telehealth: Payer: Self-pay | Admitting: Internal Medicine

## 2021-10-25 DIAGNOSIS — R55 Syncope and collapse: Secondary | ICD-10-CM | POA: Diagnosis not present

## 2021-10-25 DIAGNOSIS — E669 Obesity, unspecified: Secondary | ICD-10-CM | POA: Diagnosis not present

## 2021-10-25 DIAGNOSIS — E782 Mixed hyperlipidemia: Secondary | ICD-10-CM | POA: Diagnosis not present

## 2021-10-25 DIAGNOSIS — I471 Supraventricular tachycardia: Secondary | ICD-10-CM | POA: Diagnosis not present

## 2021-10-25 DIAGNOSIS — I340 Nonrheumatic mitral (valve) insufficiency: Secondary | ICD-10-CM | POA: Diagnosis not present

## 2021-10-25 DIAGNOSIS — I10 Essential (primary) hypertension: Secondary | ICD-10-CM | POA: Diagnosis not present

## 2021-10-25 NOTE — Telephone Encounter (Signed)
Pt called stating that Dr Silvio Pate said if her blood pressure is over 140 to give him a call. Pt states that this morning it was 142. Please advise.

## 2021-10-25 NOTE — Telephone Encounter (Signed)
Called and spoke to pt. She said that her BP has gone up at times in the 140s/90s. It is not consistent  She is taking lisinopril 10 mg daily. Her cardiologist just added metoprolol succinate 25 mg today. She has not started it. I advised her that could help lower her BP some, also. I will forward to Dr Silvio Pate to advise.

## 2021-10-26 NOTE — Telephone Encounter (Signed)
Tried to call pt. No answer and no VM to leave a message.

## 2021-10-27 NOTE — Telephone Encounter (Signed)
Tried to call pt again. No answer. No VM to leave a message.

## 2021-10-28 NOTE — Telephone Encounter (Signed)
3rd attempt to call pt. No answer and no VM.

## 2021-11-08 DIAGNOSIS — I471 Supraventricular tachycardia: Secondary | ICD-10-CM | POA: Diagnosis not present

## 2021-11-08 DIAGNOSIS — I10 Essential (primary) hypertension: Secondary | ICD-10-CM | POA: Diagnosis not present

## 2021-11-08 DIAGNOSIS — E782 Mixed hyperlipidemia: Secondary | ICD-10-CM | POA: Diagnosis not present

## 2021-11-08 DIAGNOSIS — R55 Syncope and collapse: Secondary | ICD-10-CM | POA: Diagnosis not present

## 2021-11-08 DIAGNOSIS — E669 Obesity, unspecified: Secondary | ICD-10-CM | POA: Diagnosis not present

## 2021-11-08 DIAGNOSIS — I340 Nonrheumatic mitral (valve) insufficiency: Secondary | ICD-10-CM | POA: Diagnosis not present

## 2021-11-14 ENCOUNTER — Encounter: Payer: Medicare PPO | Admitting: Internal Medicine

## 2021-11-21 NOTE — Progress Notes (Signed)
[facility] CONSULT NOTE  Patient Care Team: [name], MD as PCP - General [name] Susann Givens., MD (Rheumatology)  CHIEF COMPLAINTS/PURPOSE OF CONSULTATION: Polymyositis/IVIG infusions  #Polymyositis [Dr. [name]; 2019; left arm biopsy]; low dose prednisone/methotrexate/IVIG infusions 400 mg kilogram daily x4 days every 6 weeks  Oncology History   No history exists.    HISTORY OF PRESENTING ILLNESS: Alone.  Walks independently.  Bridget Romero 75 y.o. female with a history of polymyositis is here for follow-up/proceed with IVIG infusions. Feels well and denies complaints. No worsening arm or leg weakness, headaches, falls, or diarrhea.   Review of Systems  Constitutional:  Negative for chills, diaphoresis, fever, malaise/fatigue and weight loss.  HENT:  Negative for nosebleeds and sore throat.   Eyes:  Negative for double vision.  Respiratory:  Negative for cough, hemoptysis, sputum production, shortness of breath and wheezing.   Cardiovascular:  Negative for chest pain, palpitations, orthopnea and leg swelling.  Gastrointestinal:  Negative for abdominal pain, blood in stool, constipation, diarrhea, heartburn, melena, nausea and vomiting.  Genitourinary:  Negative for dysuria, frequency and urgency.  Musculoskeletal:  Negative for back pain and joint pain.  Skin: Negative.  Negative for itching and rash.  Neurological:  Negative for dizziness, tingling, focal weakness, weakness and headaches.  Endo/Heme/Allergies:  Does not bruise/bleed easily.  Psychiatric/Behavioral:  Negative for depression. The patient is not nervous/anxious and does not have insomnia.     MEDICAL HISTORY:  Past Medical History:  Diagnosis Date   Allergy    Asthma    Diabetes mellitus    Hyperlipidemia    Hypertension    Neuromuscular disorder ([place])    polymyositis   Osteoarthritis    Vitamin B12 deficiency     SURGICAL HISTORY: Past Surgical History:  Procedure  Laterality Date   COLONOSCOPY WITH PROPOFOL N/A 03/07/2016   Procedure: COLONOSCOPY WITH PROPOFOL;  Surgeon: Lollie Sails, MD;  Location: [facility] ENDOSCOPY;  Service: Endoscopy;  Laterality: N/A;    SOCIAL HISTORY: Social History   Socioeconomic History   Marital status: Widowed    Spouse name: Not on file   Number of children: 2   Years of education: Not on file   Highest education level: Not on file  Occupational History   Occupation: Retired as Training and development officer at [employer]:    Tobacco Use   Smoking status: Former   Smokeless tobacco: Never   Tobacco comments:    age 15 when stopped smoking  Vaping Use   Vaping Use: Never used  Substance and Sexual Activity   Alcohol use: No   Drug use: No   Sexual activity: Not on file  Other Topics Concern   Not on file  Social History Narrative   has living will   Son and daughter should make health care decisions for her   Would accept resuscitation   Not sure about tube feeds   Social Determinants of Health   Financial Resource Strain: Not on file  Food Insecurity: Not on file  Transportation Needs: Not on file  Physical Activity: Not on file  Stress: Not on file  Social Connections: Not on file  Intimate Partner Violence: Not on file    FAMILY HISTORY: Family History  Problem Relation Age of Onset   Heart disease Mother    Cancer Father    Breast cancer Neg Hx     ALLERGIES:  is allergic to atorvastatin and pravastatin.  MEDICATIONS:  Current  Outpatient Medications  Medication Sig Dispense Refill   Accu-Chek FastClix Lancets MISC USE TO TEST BLOOD SUGAR TWICE DAILY DX E11.40 102 each 11   acetaminophen (TYLENOL) 500 MG tablet Take 500 mg by mouth every 6 (six) hours as needed.     Alcohol Swabs (B-D SINGLE USE SWABS REGULAR) PADS Use to test blood sugar once daily dx: 250.00 100 each 3   B-D TB SYRINGE 1CC/25GX5/8" 25G X 5/8" 1 ML MISC      Blood Glucose Calibration (ACCU-CHEK AVIVA) SOLN  Use as directed 1 each 3   Cholecalciferol (VITAMIN D) 1000 UNITS capsule Take 1,000 Units by mouth daily.     folic acid (FOLVITE) 1 MG tablet Take 1 mg by mouth daily.     gabapentin (NEURONTIN) 300 MG capsule Take 1 capsule by mouth 3 (three) times daily.     glucose blood (ACCU-CHEK SMARTVIEW) test strip USE AS DIRECTED TO CHECK BLOOD SUGAR TWICE DAILY 100 each 6   LANTUS 100 UNIT/ML injection INJECT 10 TO 20 UNITS INTO THE SKIN DAILY AS DIRECTED 10 mL 5   lisinopril (ZESTRIL) 10 MG tablet TAKE 1 TABLET BY MOUTH ONCE A DAY 90 tablet 1   magnesium citrate SOLN Take 148 mLs by mouth once a week.     metFORMIN (GLUCOPHAGE) 1000 MG tablet TAKE 1 TABLET BY MOUTH TWICE A DAY 180 tablet 3   methotrexate 250 MG/10ML injection Inject into the skin.      metoprolol succinate (TOPROL-XL) 25 MG 24 hr tablet Take 25 mg by mouth daily.     Multiple Vitamin (MULTIVITAMIN) tablet Take 1 tablet by mouth daily.     predniSONE (DELTASONE) 2.5 MG tablet Take 2.5 mg by mouth daily.     trospium (SANCTURA) 20 MG tablet Take 1 tab 1 hour prior to bedtime 30 tablet 11   No current facility-administered medications for this visit.   Facility-Administered Medications Ordered in Other Visits  Medication Dose Route Frequency Provider Last Rate Last Admin   acetaminophen (TYLENOL) tablet 650 mg  650 mg Oral Once Charlaine Dalton R, MD       dextrose 5 % solution   Intravenous Continuous Cammie Sickle, MD   Stopped at 08/30/21 1350   dextrose 5 % solution   Intravenous Once Verlon Au, NP       diphenhydrAMINE (BENADRYL) capsule 25 mg  25 mg Oral Once Cammie Sickle, MD       Immune Globulin 10% (PRIVIGEN) IV infusion 45 g  500 mg/kg Intravenous Q24H Charlaine Dalton R, MD       methylPREDNISolone sodium succinate (SOLU-MEDROL) 40 mg/mL injection 10 mg  10 mg Intravenous Once Cammie Sickle, MD        .  PHYSICAL EXAMINATION: ECOG PERFORMANCE STATUS: 0 - Asymptomatic  Vitals:    11/22/21 0836  BP: (!) 143/66  Pulse: 69  Resp: 20  Temp: 97.8 F (36.6 C)  SpO2: 100%   Filed Weights   11/22/21 0836  Weight: 199 lb (90.3 kg)    Physical Exam Vitals reviewed.  Constitutional:      Appearance: She is not ill-appearing.  Eyes:     General: No scleral icterus.    Conjunctiva/sclera: Conjunctivae normal.  Cardiovascular:     Rate and Rhythm: Normal rate and regular rhythm.  Abdominal:     General: There is no distension.     Palpations: Abdomen is soft.     Tenderness: There is no abdominal tenderness. There  is no guarding.  Musculoskeletal:        General: No deformity.     Right lower leg: No edema.     Left lower leg: No edema.  Lymphadenopathy:     Cervical: No cervical adenopathy.  Skin:    General: Skin is warm and dry.  Neurological:     Mental Status: She is alert and oriented to person, place, and time. Mental status is at baseline.  Psychiatric:        Mood and Affect: Mood normal.        Behavior: Behavior normal.    LABORATORY DATA:  I have reviewed the data as listed Lab Results  Component Value Date   WBC 5.1 11/22/2021   HGB 9.7 (L) 11/22/2021   HCT 30.6 (L) 11/22/2021   MCV 94.2 11/22/2021   PLT 247 11/22/2021   Recent Labs    08/30/21 0822 09/08/21 1600 10/11/21 0813 11/22/21 0808  NA 138 136 137 138  K 3.9 4.2 4.2 4.0  CL 103 104 105 105  CO2 29 26 27 26   GLUCOSE 135* 196* 176* 191*  BUN 18 20 23 21   CREATININE 0.73 0.85 0.80 0.87  CALCIUM 9.2 9.3 9.5 9.0  GFRNONAA >60 >60 >60 >60  PROT 7.1  --  7.1 7.1  ALBUMIN 3.5  --  3.7 3.9  AST 18  --  17 19  ALT 12  --  11 11  ALKPHOS 47  --  51 52  BILITOT 0.6  --  0.8 0.8     RADIOGRAPHIC STUDIES: I have personally reviewed the radiological images as listed and agreed with the findings in the report. No results found.  ASSESSMENT & PLAN:   1.  Polymyositis-Dr. [name].  Clinically doing well on IVIG 2 g/kg every 6 weeks and prednisone 5 mg a day and  methotrexate 10 mg subcu weekly.  Aldolase and CK are pending at time of dictation. Continue IVIG every 6 weeks.  Proceed with IVIG x 4 days today. 2.  Normocytic anemia-chronic hemoglobin of 9-10.  September 2022, iron saturation 38%, ferritin 60.  LDH is normal making hemolysis less likely.  Monitor. 3.  Urinary incontinence-distal etiology overactive bladder versus others.  Status post evaluation with urology.  Does not complain of this today.   Disposition:  Proceed with IVIG x 4 days RTC in 6 weeks for labs (cbc, cmp, ck, aldolase, ldh, haptoglobin), Dr Rogue Bussing, and IVIG   No problem-specific Assessment & Plan notes found for this encounter.  All questions were answered. The patient knows to call the clinic with any problems, questions or concerns.    Verlon Au, NP 11/22/2021

## 2021-11-22 ENCOUNTER — Inpatient Hospital Stay: Payer: Medicare PPO

## 2021-11-22 ENCOUNTER — Inpatient Hospital Stay: Payer: Medicare PPO | Attending: Internal Medicine | Admitting: Nurse Practitioner

## 2021-11-22 ENCOUNTER — Encounter: Payer: Self-pay | Admitting: Nurse Practitioner

## 2021-11-22 ENCOUNTER — Other Ambulatory Visit: Payer: Self-pay

## 2021-11-22 VITALS — BP 143/66 | HR 69 | Temp 97.8°F | Resp 20 | Wt 199.0 lb

## 2021-11-22 VITALS — BP 143/62 | HR 61 | Temp 96.2°F | Resp 18

## 2021-11-22 DIAGNOSIS — Z79899 Other long term (current) drug therapy: Secondary | ICD-10-CM | POA: Diagnosis not present

## 2021-11-22 DIAGNOSIS — N39498 Other specified urinary incontinence: Secondary | ICD-10-CM | POA: Diagnosis not present

## 2021-11-22 DIAGNOSIS — D649 Anemia, unspecified: Secondary | ICD-10-CM | POA: Insufficient documentation

## 2021-11-22 DIAGNOSIS — M3320 Polymyositis, organ involvement unspecified: Secondary | ICD-10-CM | POA: Diagnosis not present

## 2021-11-22 LAB — CBC WITH DIFFERENTIAL/PLATELET
Abs Immature Granulocytes: 0.01 10*3/uL (ref 0.00–0.07)
Basophils Absolute: 0 10*3/uL (ref 0.0–0.1)
Basophils Relative: 1 %
Eosinophils Absolute: 0.2 10*3/uL (ref 0.0–0.5)
Eosinophils Relative: 3 %
HCT: 30.6 % — ABNORMAL LOW (ref 36.0–46.0)
Hemoglobin: 9.7 g/dL — ABNORMAL LOW (ref 12.0–15.0)
Immature Granulocytes: 0 %
Lymphocytes Relative: 30 %
Lymphs Abs: 1.5 10*3/uL (ref 0.7–4.0)
MCH: 29.8 pg (ref 26.0–34.0)
MCHC: 31.7 g/dL (ref 30.0–36.0)
MCV: 94.2 fL (ref 80.0–100.0)
Monocytes Absolute: 0.3 10*3/uL (ref 0.1–1.0)
Monocytes Relative: 6 %
Neutro Abs: 3.1 10*3/uL (ref 1.7–7.7)
Neutrophils Relative %: 60 %
Platelets: 247 10*3/uL (ref 150–400)
RBC: 3.25 MIL/uL — ABNORMAL LOW (ref 3.87–5.11)
RDW: 14.3 % (ref 11.5–15.5)
WBC: 5.1 10*3/uL (ref 4.0–10.5)
nRBC: 0 % (ref 0.0–0.2)

## 2021-11-22 LAB — COMPREHENSIVE METABOLIC PANEL
ALT: 11 U/L (ref 0–44)
AST: 19 U/L (ref 15–41)
Albumin: 3.9 g/dL (ref 3.5–5.0)
Alkaline Phosphatase: 52 U/L (ref 38–126)
Anion gap: 7 (ref 5–15)
BUN: 21 mg/dL (ref 8–23)
CO2: 26 mmol/L (ref 22–32)
Calcium: 9 mg/dL (ref 8.9–10.3)
Chloride: 105 mmol/L (ref 98–111)
Creatinine, Ser: 0.87 mg/dL (ref 0.44–1.00)
GFR, Estimated: >60 mL/min (ref >60)
Glucose, Bld: 191 mg/dL — ABNORMAL HIGH (ref 70–99)
Potassium: 4 mmol/L (ref 3.5–5.1)
Sodium: 138 mmol/L (ref 135–145)
Total Bilirubin: 0.8 mg/dL (ref 0.3–1.2)
Total Protein: 7.1 g/dL (ref 6.5–8.1)

## 2021-11-22 LAB — CK: Total CK: 59 U/L (ref 38–234)

## 2021-11-22 LAB — LACTATE DEHYDROGENASE: LDH: 148 U/L (ref 98–192)

## 2021-11-22 MED ORDER — IMMUNE GLOBULIN (HUMAN) 5 GM/50ML IV SOLN
INTRAVENOUS | Status: DC
  Filled 2021-11-22: qty 450

## 2021-11-22 MED ORDER — ACETAMINOPHEN 325 MG PO TABS
650.0000 mg | ORAL_TABLET | Freq: Once | ORAL | Status: AC
  Administered 2021-11-22: 650 mg via ORAL

## 2021-11-22 MED ORDER — DEXTROSE 5 % IV SOLN
Freq: Once | INTRAVENOUS | Status: AC
  Filled 2021-11-22: qty 250

## 2021-11-22 MED ORDER — DIPHENHYDRAMINE HCL 25 MG PO CAPS
25.0000 mg | ORAL_CAPSULE | Freq: Once | ORAL | Status: AC
  Administered 2021-11-22: 25 mg via ORAL

## 2021-11-22 MED ORDER — METHYLPREDNISOLONE SODIUM SUCC 125 MG IJ SOLR
10.0000 mg | Freq: Once | INTRAMUSCULAR | Status: AC
  Administered 2021-11-22: 10 mg via INTRAVENOUS

## 2021-11-22 NOTE — Patient Instructions (Signed)
[facility] ONCOLOGY   Discharge Instructions: Thank you for choosing [facility] to provide your oncology and hematology care.  If you have a lab appointment with the [facility], please go directly to the [facility] and check in at the registration area.  We strive to give you quality time with your provider. You may need to reschedule your appointment if you arrive late (15 or more minutes).  Arriving late affects you and other patients whose appointments are after yours.  Also, if you miss three or more appointments without notifying the office, you may be dismissed from the clinic at the provider's discretion.      For prescription refill requests, have your pharmacy contact our office and allow 72 hours for refills to be completed.    Today you received the following: [facility].      BELOW ARE SYMPTOMS THAT SHOULD BE REPORTED IMMEDIATELY: *FEVER GREATER THAN 100.4 F (38 C) OR HIGHER *CHILLS OR SWEATING *NAUSEA AND VOMITING THAT IS NOT CONTROLLED WITH YOUR NAUSEA MEDICATION *UNUSUAL SHORTNESS OF BREATH *UNUSUAL BRUISING OR BLEEDING *URINARY PROBLEMS (pain or burning when urinating, or frequent urination) *BOWEL PROBLEMS (unusual diarrhea, constipation, pain near the anus) TENDERNESS IN MOUTH AND THROAT WITH OR WITHOUT PRESENCE OF ULCERS (sore throat, sores in mouth, or a toothache) UNUSUAL RASH, SWELLING OR PAIN  UNUSUAL VAGINAL DISCHARGE OR ITCHING   Items with * indicate a potential emergency and should be followed up as soon as possible or go to the Emergency Department if any problems should occur.  Should you have questions after your visit or need to cancel or reschedule your appointment, please contact [facility]  [phone] and follow the prompts.  Office hours are 8:00 a.m. to 4:30 p.m. Monday - Friday. Please note that voicemails left after 4:00 p.m. may not be returned until the following  business day.  We are closed weekends and major holidays. You have access to a nurse at all times for urgent questions. Please call the main number to the clinic [phone] and follow the prompts.  For any non-urgent questions, you may also contact your provider using MyChart. We now offer e-Visits for anyone 64 and older to request care online for non-urgent symptoms. For details visit [email or web address].   Also download the MyChart app! Go to the app store, search "MyChart", open the app, select [facility], and log in with your MyChart username and password.  Due to Covid, a mask is required upon entering the hospital/clinic. If you do not have a mask, one will be given to you upon arrival. For doctor visits, patients may have 1 support person aged 102 or older with them. For treatment visits, patients cannot have anyone with them due to current Covid guidelines and our immunocompromised population.

## 2021-11-22 NOTE — Addendum Note (Signed)
Addended by: Harvel Ricks A on: 11/22/2021 09:27 AM   Modules accepted: Orders

## 2021-11-23 ENCOUNTER — Inpatient Hospital Stay: Payer: Medicare PPO

## 2021-11-23 VITALS — BP 159/71 | HR 64 | Temp 96.6°F

## 2021-11-23 DIAGNOSIS — M3320 Polymyositis, organ involvement unspecified: Secondary | ICD-10-CM | POA: Diagnosis not present

## 2021-11-23 DIAGNOSIS — D649 Anemia, unspecified: Secondary | ICD-10-CM | POA: Diagnosis not present

## 2021-11-23 DIAGNOSIS — N39498 Other specified urinary incontinence: Secondary | ICD-10-CM | POA: Diagnosis not present

## 2021-11-23 DIAGNOSIS — Z79899 Other long term (current) drug therapy: Secondary | ICD-10-CM | POA: Diagnosis not present

## 2021-11-23 LAB — HAPTOGLOBIN: Haptoglobin: 148 mg/dL (ref 42–346)

## 2021-11-23 LAB — ALDOLASE: Aldolase: 3.9 U/L (ref 3.3–10.3)

## 2021-11-23 MED ORDER — METHYLPREDNISOLONE SODIUM SUCC 125 MG IJ SOLR
10.0000 mg | Freq: Once | INTRAMUSCULAR | Status: AC
  Administered 2021-11-23: 10 mg via INTRAVENOUS
  Filled 2021-11-23: qty 2

## 2021-11-23 MED ORDER — ACETAMINOPHEN 325 MG PO TABS
650.0000 mg | ORAL_TABLET | Freq: Once | ORAL | Status: AC
  Administered 2021-11-23: 650 mg via ORAL
  Filled 2021-11-23: qty 2

## 2021-11-23 MED ORDER — DIPHENHYDRAMINE HCL 25 MG PO CAPS
25.0000 mg | ORAL_CAPSULE | Freq: Once | ORAL | Status: AC
  Administered 2021-11-23: 25 mg via ORAL
  Filled 2021-11-23: qty 1

## 2021-11-23 MED ORDER — IMMUNE GLOBULIN (HUMAN) 5 GM/50ML IV SOLN
45.0000 g | INTRAVENOUS | Status: DC
  Administered 2021-11-23: 45 g via INTRAVENOUS
  Filled 2021-11-23: qty 50

## 2021-11-23 MED ORDER — DEXTROSE 5 % IV SOLN
INTRAVENOUS | Status: DC
  Filled 2021-11-23: qty 250

## 2021-11-24 ENCOUNTER — Other Ambulatory Visit: Payer: Self-pay

## 2021-11-24 ENCOUNTER — Inpatient Hospital Stay: Payer: Medicare PPO | Attending: Internal Medicine

## 2021-11-24 ENCOUNTER — Telehealth: Payer: Self-pay | Admitting: Internal Medicine

## 2021-11-24 VITALS — BP 138/65 | HR 75 | Temp 96.9°F | Resp 18

## 2021-11-24 DIAGNOSIS — M3320 Polymyositis, organ involvement unspecified: Secondary | ICD-10-CM | POA: Diagnosis not present

## 2021-11-24 MED ORDER — ACETAMINOPHEN 325 MG PO TABS
650.0000 mg | ORAL_TABLET | Freq: Once | ORAL | Status: AC
  Administered 2021-11-24: 650 mg via ORAL
  Filled 2021-11-24: qty 2

## 2021-11-24 MED ORDER — METHYLPREDNISOLONE SODIUM SUCC 40 MG IJ SOLR
10.0000 mg | Freq: Once | INTRAMUSCULAR | Status: AC
  Administered 2021-11-24: 10 mg via INTRAVENOUS
  Filled 2021-11-24: qty 1

## 2021-11-24 MED ORDER — IMMUNE GLOBULIN (HUMAN) 5 GM/50ML IV SOLN
45.0000 g | INTRAVENOUS | Status: DC
  Administered 2021-11-24: 45 g via INTRAVENOUS
  Filled 2021-11-24: qty 400

## 2021-11-24 MED ORDER — DIPHENHYDRAMINE HCL 25 MG PO CAPS
25.0000 mg | ORAL_CAPSULE | Freq: Once | ORAL | Status: AC
  Administered 2021-11-24: 25 mg via ORAL
  Filled 2021-11-24: qty 1

## 2021-11-24 MED ORDER — METHYLPREDNISOLONE SODIUM SUCC 40 MG IJ SOLR
10.0000 mg | Freq: Once | INTRAMUSCULAR | Status: DC
  Filled 2021-11-24: qty 1

## 2021-11-24 MED ORDER — DEXTROSE 5 % IV SOLN
INTRAVENOUS | Status: DC
  Filled 2021-11-24: qty 250

## 2021-11-24 NOTE — Patient Instructions (Signed)
[facility] CANCER CTR AT [place]  Discharge Instructions: Thank you for choosing [facility] to provide your oncology and hematology care.  If you have a lab appointment with the [facility], please go directly to the [facility] and check in at the registration area.  Wear comfortable clothing and clothing appropriate for easy access to any Portacath or PICC line.   We strive to give you quality time with your provider. You may need to reschedule your appointment if you arrive late (15 or more minutes).  Arriving late affects you and other patients whose appointments are after yours.  Also, if you miss three or more appointments without notifying the office, you may be dismissed from the clinic at the provider's discretion.      For prescription refill requests, have your pharmacy contact our office and allow 72 hours for refills to be completed.    Today you received the following chemotherapy and/or immunotherapy agents IVIG       To help prevent nausea and vomiting after your treatment, we encourage you to take your nausea medication as directed.  BELOW ARE SYMPTOMS THAT SHOULD BE REPORTED IMMEDIATELY: *FEVER GREATER THAN 100.4 F (38 C) OR HIGHER *CHILLS OR SWEATING *NAUSEA AND VOMITING THAT IS NOT CONTROLLED WITH YOUR NAUSEA MEDICATION *UNUSUAL SHORTNESS OF BREATH *UNUSUAL BRUISING OR BLEEDING *URINARY PROBLEMS (pain or burning when urinating, or frequent urination) *BOWEL PROBLEMS (unusual diarrhea, constipation, pain near the anus) TENDERNESS IN MOUTH AND THROAT WITH OR WITHOUT PRESENCE OF ULCERS (sore throat, sores in mouth, or a toothache) UNUSUAL RASH, SWELLING OR PAIN  UNUSUAL VAGINAL DISCHARGE OR ITCHING   Items with * indicate a potential emergency and should be followed up as soon as possible or go to the Emergency Department if any problems should occur.  Please show the CHEMOTHERAPY ALERT CARD or IMMUNOTHERAPY ALERT CARD at check-in to the  Emergency Department and triage nurse.  Should you have questions after your visit or need to cancel or reschedule your appointment, please contact [facility] CANCER [place] AT [place]  [phone] and follow the prompts.  Office hours are 8:00 a.m. to 4:30 p.m. Monday - Friday. Please note that voicemails left after 4:00 p.m. may not be returned until the following business day.  We are closed weekends and major holidays. You have access to a nurse at all times for urgent questions. Please call the main number to the clinic [phone] and follow the prompts.  For any non-urgent questions, you may also contact your provider using MyChart. We now offer e-Visits for anyone 46 and older to request care online for non-urgent symptoms. For details visit [email or web address].   Also download the MyChart app! Go to the app store, search "MyChart", open the app, select [facility], and log in with your MyChart username and password.  Due to Covid, a mask is required upon entering the hospital/clinic. If you do not have a mask, one will be given to you upon arrival. For doctor visits, patients may have 1 support person aged 73 or older with them. For treatment visits, patients cannot have anyone with them due to current Covid guidelines and our immunocompromised population.

## 2021-11-24 NOTE — Telephone Encounter (Signed)
Pt called stating that she needs a BP Monitor. Pt states that her insurance need the clarification from Dr. Silvio Pate.

## 2021-11-24 NOTE — Telephone Encounter (Signed)
error 

## 2021-11-25 ENCOUNTER — Inpatient Hospital Stay: Payer: Medicare PPO

## 2021-11-25 VITALS — BP 146/65 | HR 71 | Temp 98.9°F | Resp 19

## 2021-11-25 DIAGNOSIS — M3320 Polymyositis, organ involvement unspecified: Secondary | ICD-10-CM | POA: Diagnosis not present

## 2021-11-25 MED ORDER — IMMUNE GLOBULIN (HUMAN) 5 GM/50ML IV SOLN
45.0000 g | INTRAVENOUS | Status: DC
  Administered 2021-11-25: 45 g via INTRAVENOUS
  Filled 2021-11-25 (×2): qty 400

## 2021-11-25 MED ORDER — DIPHENHYDRAMINE HCL 25 MG PO CAPS
25.0000 mg | ORAL_CAPSULE | Freq: Once | ORAL | Status: AC
  Administered 2021-11-25: 25 mg via ORAL
  Filled 2021-11-25: qty 1

## 2021-11-25 MED ORDER — METHYLPREDNISOLONE SODIUM SUCC 40 MG IJ SOLR: 10.0000 mg | Freq: Once | INTRAMUSCULAR | Status: DC

## 2021-11-25 MED ORDER — DEXTROSE 5 % IV SOLN
Freq: Once | INTRAVENOUS | Status: AC
  Filled 2021-11-25: qty 250

## 2021-11-25 MED ORDER — ACETAMINOPHEN 325 MG PO TABS
650.0000 mg | ORAL_TABLET | Freq: Once | ORAL | Status: AC
  Administered 2021-11-25: 650 mg via ORAL
  Filled 2021-11-25: qty 2

## 2021-11-25 MED ORDER — METHYLPREDNISOLONE SODIUM SUCC 40 MG IJ SOLR
10.0000 mg | Freq: Once | INTRAMUSCULAR | Status: DC
  Filled 2021-11-25: qty 1

## 2021-11-25 MED ORDER — METHYLPREDNISOLONE SODIUM SUCC 125 MG IJ SOLR
10.0000 mg | Freq: Once | INTRAMUSCULAR | Status: AC
  Administered 2021-11-25: 10 mg via INTRAVENOUS
  Filled 2021-11-25: qty 2

## 2021-11-28 NOTE — Telephone Encounter (Signed)
Tried to call pt . No answer.  

## 2021-11-28 NOTE — Telephone Encounter (Signed)
Spoke to pt. Advised her to get in touch with [facility] and find out if we just need to send a rx to the pharmacy for a BP monitor or does she need to take a rx to a medical supply store. She will call back when she knows what we need to do.

## 2021-12-06 ENCOUNTER — Other Ambulatory Visit: Payer: Self-pay

## 2021-12-06 ENCOUNTER — Ambulatory Visit (INDEPENDENT_AMBULATORY_CARE_PROVIDER_SITE_OTHER): Payer: Medicare PPO | Admitting: Internal Medicine

## 2021-12-06 ENCOUNTER — Encounter: Payer: Self-pay | Admitting: Internal Medicine

## 2021-12-06 VITALS — BP 110/72 | HR 84 | Temp 97.9°F | Ht 66.5 in | Wt 196.0 lb

## 2021-12-06 DIAGNOSIS — Z0000 Encounter for general adult medical examination without abnormal findings: Secondary | ICD-10-CM | POA: Diagnosis not present

## 2021-12-06 DIAGNOSIS — N3281 Overactive bladder: Secondary | ICD-10-CM

## 2021-12-06 DIAGNOSIS — E1140 Type 2 diabetes mellitus with diabetic neuropathy, unspecified: Secondary | ICD-10-CM | POA: Diagnosis not present

## 2021-12-06 DIAGNOSIS — I10 Essential (primary) hypertension: Secondary | ICD-10-CM

## 2021-12-06 DIAGNOSIS — M3320 Polymyositis, organ involvement unspecified: Secondary | ICD-10-CM | POA: Diagnosis not present

## 2021-12-06 DIAGNOSIS — Z794 Long term (current) use of insulin: Secondary | ICD-10-CM

## 2021-12-06 LAB — HM DIABETES FOOT EXAM

## 2021-12-06 LAB — POCT GLYCOSYLATED HEMOGLOBIN (HGB A1C): Hemoglobin A1C: 6.6 % — AB (ref 4.0–5.6)

## 2021-12-06 MED ORDER — INSULIN GLARGINE 100 UNIT/ML ~~LOC~~ SOLN: 10.0000 [IU] | Freq: Every day | SUBCUTANEOUS | 0 refills | Status: DC

## 2021-12-06 NOTE — Assessment & Plan Note (Signed)
Lab Results  Component Value Date   HGBA1C 6.6 (A) 12/06/2021   Good control on insulin (will change to 10 units daily) and metformin Gabapentin tid for neuropathy

## 2021-12-06 NOTE — Assessment & Plan Note (Signed)
Controlled with IV MTX and gammaglobulin Low dose prednisone

## 2021-12-06 NOTE — Progress Notes (Signed)
Hearing Screening - Comments:: Passed whisper test Vision Screening - Comments:: July 2022  

## 2021-12-06 NOTE — Assessment & Plan Note (Signed)
Better with the trospium and no coffee

## 2021-12-06 NOTE — Assessment & Plan Note (Signed)
BP Readings from Last 3 Encounters:  12/06/21 110/72  11/25/21 (!) 146/65  11/24/21 138/65   Low with metoprolol added---causes diarrhea. Will stop  Continue lisinopril--increase if persistently elevated

## 2021-12-06 NOTE — Assessment & Plan Note (Signed)
I have personally reviewed the Medicare Annual Wellness questionnaire and have noted 1. The patient's medical and social history 2. Their use of alcohol, tobacco or illicit drugs 3. Their current medications and supplements 4. The patient's functional ability including ADL's, fall risks, home safety risks and hearing or visual             impairment. 5. Diet and physical activities 6. Evidence for depression or mood disorders  The patients weight, height, BMI and visual acuity have been recorded in the chart I have made referrals, counseling and provided education to the patient based review of the above and I have provided the pt with a written personalized care plan for preventive services.  I have provided you with a copy of your personalized plan for preventive services. Please take the time to review along with your updated medication list.  Fairly healthy Needs to work on exercise Consider one last colon in 2027 Mammogram due by April Td booster when covered or if injury Consider shingrix when covered Had bivalent COVID and flu vaccines

## 2021-12-06 NOTE — Patient Instructions (Addendum)
You can try over the counter diclofenac gel up to three times a day for when your knee hurts.  You can change the metoprolol to 1 pill every other day for a week---and then stop it.

## 2021-12-06 NOTE — Progress Notes (Signed)
Subjective:    Patient ID: Bridget Romero, female    DOB: 1946-06-20, 75 y.o.   MRN: [id number]  HPI Here for Medicare wellness visit and follow up of chronic health conditions Reviewed advanced directives Reviewed other doctors----Dr Kernodle--rheumatologist, Dr Thomas Hoff, Dr Stoioff--urology, Dr Dodd--dentist  No hospitalizations or surgery this year Vision and hearing are fine No alcohol or tobacco Sporadic with exercise--discussed No falls No depression or anhedonia Independent with instrumental ADLs No memory problems  Polymyositis is controlled with IV MTX Prednisone decreased to 2.5mg  daily No muscle weakness  Some pain in right knee Discussed trying diclofenac  Sugars are good--still checks bid Still taking insulin 10-20 units depending on her AM sugar No hypoglycemic reactions Some numbness in feet---has started vitamin B12 also (with gabapentin)  She was started on metoprolol by cardiologist after spell (syncope)--[place] No palpitations No chest pain or SOB She doesn't feel good on the medication---gives her diarrhea  Satisfied with trospium for OAB Has cut back on coffee as well--which was a trigger  Current Outpatient Medications on File Prior to Visit  Medication Sig Dispense Refill   Accu-Chek FastClix Lancets MISC USE TO TEST BLOOD SUGAR TWICE DAILY DX E11.40 102 each 11   acetaminophen (TYLENOL) 500 MG tablet Take 500 mg by mouth every 6 (six) hours as needed.     Alcohol Swabs (B-D SINGLE USE SWABS REGULAR) PADS Use to test blood sugar once daily dx: 250.00 100 each 3   B-D TB SYRINGE 1CC/25GX5/8" 25G X 5/8" 1 ML MISC      Blood Glucose Calibration (ACCU-CHEK AVIVA) SOLN Use as directed 1 each 3   Cholecalciferol (VITAMIN D) 1000 UNITS capsule Take 1,000 Units by mouth daily.     folic acid (FOLVITE) 1 MG tablet Take 1 mg by mouth daily.     gabapentin (NEURONTIN) 300 MG capsule Take 1 capsule by mouth 3 (three) times daily.     glucose  blood (ACCU-CHEK SMARTVIEW) test strip USE AS DIRECTED TO CHECK BLOOD SUGAR TWICE DAILY 100 each 6   LANTUS 100 UNIT/ML injection INJECT 10 TO 20 UNITS INTO THE SKIN DAILY AS DIRECTED 10 mL 5   lisinopril (ZESTRIL) 10 MG tablet TAKE 1 TABLET BY MOUTH ONCE A DAY 90 tablet 1   magnesium citrate SOLN Take 148 mLs by mouth once a week.     metFORMIN (GLUCOPHAGE) 1000 MG tablet TAKE 1 TABLET BY MOUTH TWICE A DAY 180 tablet 3   methotrexate 250 MG/10ML injection Inject into the skin.      metoprolol succinate (TOPROL-XL) 25 MG 24 hr tablet Take 25 mg by mouth daily.     Multiple Vitamin (MULTIVITAMIN) tablet Take 1 tablet by mouth daily.     predniSONE (DELTASONE) 2.5 MG tablet Take 2.5 mg by mouth daily.     trospium (SANCTURA) 20 MG tablet Take 1 tab 1 hour prior to bedtime 30 tablet 11   Current Facility-Administered Medications on File Prior to Visit  Medication Dose Route Frequency Provider Last Rate Last Admin   dextrose 5 % solution   Intravenous Continuous Cammie Sickle, MD   Stopped at 08/30/21 1350    Allergies  Allergen Reactions   Atorvastatin Other (See Comments)    myalgias   Pravastatin Other (See Comments)    Muscle pain    Past Medical History:  Diagnosis Date   Allergy    Asthma    Diabetes mellitus    Hyperlipidemia    Hypertension  Neuromuscular disorder ([place])    polymyositis   Osteoarthritis    Vitamin B12 deficiency     Past Surgical History:  Procedure Laterality Date   COLONOSCOPY WITH PROPOFOL N/A 03/07/2016   Procedure: COLONOSCOPY WITH PROPOFOL;  Surgeon: Lollie Sails, MD;  Location: [facility] ENDOSCOPY;  Service: Endoscopy;  Laterality: N/A;    Family History  Problem Relation Age of Onset   Heart disease Mother    Cancer Father    Breast cancer Neg Hx     Social History   Socioeconomic History   Marital status: Widowed    Spouse name: Not on file   Number of children: 2   Years of education: Not on file   Highest education  level: Not on file  Occupational History   Occupation: Retired as Training and development officer at [employer]:    Tobacco Use   Smoking status: Former   Smokeless tobacco: Never   Tobacco comments:    age 41 when stopped smoking  Vaping Use   Vaping Use: Never used  Substance and Sexual Activity   Alcohol use: No   Drug use: No   Sexual activity: Not on file  Other Topics Concern   Not on file  Social History Narrative   has living will   Son and daughter should make health care decisions for her   Would accept resuscitation   Not sure about tube feeds   Social Determinants of Health   Financial Resource Strain: Not on file  Food Insecurity: Not on file  Transportation Needs: Not on file  Physical Activity: Not on file  Stress: Not on file  Social Connections: Not on file  Intimate Partner Violence: Not on file   Review of Systems Appetite is good Weight is stable Has yeast rash under breasts---nystatin cream helps Sleeps okay Teeth okay---keeps up with dentist Wears seat belt No heartburn or dysphagia Bowels move okay--no blood Occasional backache in AM--nothing persistent.  No other joint issues No suspicious skin lesions     Objective:   Physical Exam Constitutional:      Appearance: Normal appearance.  HENT:     Mouth/Throat:     Pharynx: No oropharyngeal exudate or posterior oropharyngeal erythema.     Comments: Upper plate No lesions Eyes:     Conjunctiva/sclera: Conjunctivae normal.     Pupils: Pupils are equal, round, and reactive to light.  Cardiovascular:     Rate and Rhythm: Normal rate and regular rhythm.     Pulses: Normal pulses.     Heart sounds: No murmur heard.   No gallop.  Pulmonary:     Effort: Pulmonary effort is normal.     Breath sounds: Normal breath sounds. No wheezing or rales.  Abdominal:     Palpations: Abdomen is soft.     Tenderness: There is no abdominal tenderness.  Musculoskeletal:     Cervical back: Neck  supple.     Right lower leg: No edema.     Left lower leg: No edema.  Lymphadenopathy:     Cervical: No cervical adenopathy.  Skin:    Findings: No lesion or rash.     Comments: No foot lesions  Neurological:     Mental Status: She is alert and oriented to person, place, and time.     Comments: President---"Biden, Trump, Obama" 100-70 H-t-r-a-e Recall 3/3  Fairly normal fine touch sensation in feet  Psychiatric:        Mood and Affect: Mood  normal.        Behavior: Behavior normal.           Assessment & Plan:

## 2021-12-22 DIAGNOSIS — E119 Type 2 diabetes mellitus without complications: Secondary | ICD-10-CM | POA: Diagnosis not present

## 2021-12-22 DIAGNOSIS — G629 Polyneuropathy, unspecified: Secondary | ICD-10-CM | POA: Diagnosis not present

## 2021-12-22 DIAGNOSIS — M3322 Polymyositis with myopathy: Secondary | ICD-10-CM | POA: Diagnosis not present

## 2021-12-29 ENCOUNTER — Telehealth: Payer: Self-pay | Admitting: Internal Medicine

## 2021-12-29 MED ORDER — ACCU-CHEK FASTCLIX LANCETS MISC: 11 refills | Status: DC

## 2021-12-29 NOTE — Addendum Note (Signed)
Addended by: Pilar Grammes on: 12/29/2021 11:28 AM   Modules accepted: Orders

## 2021-12-29 NOTE — Telephone Encounter (Signed)
°  Encourage patient to contact the pharmacy for refills or they can request refills through [facility]:  Please schedule appointment if longer than 1 year  NEXT APPOINTMENT DATE:  MEDICATION: Accu-Chek FastClix Lancets MISC  Is the patient out of medication? yes  PHARMACY: [facility]  Let patient know to contact pharmacy at the end of the day to make sure medication is ready.  Please notify patient to allow 48-72 hours to process  CLINICAL FILLS OUT ALL BELOW:   LAST REFILL:  QTY:  REFILL DATE:    OTHER COMMENTS:    Okay for refill?  Please advise

## 2021-12-29 NOTE — Telephone Encounter (Signed)
Rx sent electronically.  

## 2021-12-30 ENCOUNTER — Telehealth: Payer: Self-pay | Admitting: Internal Medicine

## 2021-12-30 MED ORDER — INSULIN GLARGINE 100 UNIT/ML ~~LOC~~ SOLN: 10.0000 [IU] | Freq: Every day | SUBCUTANEOUS | 3 refills | Status: DC

## 2021-12-30 NOTE — Telephone Encounter (Signed)
Patient is requesting the needles ONLY, that go with her insulin glargine (LANTUS) 100 UNIT/ML injection Please send to: [place], [place] Phone:  [phone]  Fax:  [phone]     On current med list: N  Last appt: 12.13.22  Next appt: 6.13.23  Let patient know to contact pharmacy at the end of the day to make sure medication is ready.   Please notify patient to allow 48-72 hours to process

## 2021-12-30 NOTE — Telephone Encounter (Signed)
Rx sent electronically.  

## 2022-01-02 ENCOUNTER — Telehealth: Payer: Self-pay | Admitting: Internal Medicine

## 2022-01-02 MED ORDER — "INSULIN SYRINGE 31G X 5/16"" 0.5 ML MISC": 1.0000 | Freq: Once | 4 refills | Status: AC

## 2022-01-02 MED ORDER — "TUBERCULIN SYRINGE 25G X 5/8"" 1 ML MISC": 1.0000 | Freq: Every day | 4 refills | Status: DC

## 2022-01-02 NOTE — Addendum Note (Signed)
Addended by: Pilar Grammes on: 01/02/2022 04:52 PM   Modules accepted: Orders

## 2022-01-02 NOTE — Telephone Encounter (Signed)
We received the alternative site of care request form from [facility] for the patient to get her IVIG infusions via home or an ambulatory infusion suite.  I called the patient and discussed the request and she does not want to get home infusions.  I explained that with the closing of our [place] clinc, she would have to get her infusions at the [place] [facility] the [facility].  Since the patient is due infusion tomorrow, and we have authorization, we will treat her here with the understanding that moving forward Dr. [name] will resume management and she will get treated at [facility].  Patient agreed to the plan.  I discussed with Duwayne Heck CMA and Debbora Lacrosse [place] and all are in agreement.  I will fax the request form along with information about scheduling infusions at [facility] to Dr. [name].

## 2022-01-02 NOTE — Telephone Encounter (Signed)
I tried to call pt back that I read the message wrong and sent in Lantus instead of the syringes. I have sent them in. I tried to call the pt but there was no answer and no VM.

## 2022-01-02 NOTE — Telephone Encounter (Signed)
Pharmacy called back with the correct insulin syringe we should send in. I have sent that to [facility].

## 2022-01-02 NOTE — Addendum Note (Signed)
Addended by: Pilar Grammes on: 01/02/2022 04:04 PM   Modules accepted: Orders

## 2022-01-03 ENCOUNTER — Inpatient Hospital Stay (HOSPITAL_BASED_OUTPATIENT_CLINIC_OR_DEPARTMENT_OTHER): Payer: Medicare PPO | Admitting: Internal Medicine

## 2022-01-03 ENCOUNTER — Other Ambulatory Visit: Payer: Self-pay

## 2022-01-03 ENCOUNTER — Encounter: Payer: Self-pay | Admitting: Internal Medicine

## 2022-01-03 ENCOUNTER — Inpatient Hospital Stay: Payer: Medicare PPO

## 2022-01-03 ENCOUNTER — Inpatient Hospital Stay: Payer: Medicare PPO | Attending: Internal Medicine

## 2022-01-03 VITALS — BP 159/84 | HR 76 | Temp 97.8°F | Resp 16

## 2022-01-03 DIAGNOSIS — M3320 Polymyositis, organ involvement unspecified: Secondary | ICD-10-CM | POA: Insufficient documentation

## 2022-01-03 LAB — COMPREHENSIVE METABOLIC PANEL
ALT: 11 U/L (ref 0–44)
AST: 19 U/L (ref 15–41)
Albumin: 3.6 g/dL (ref 3.5–5.0)
Alkaline Phosphatase: 50 U/L (ref 38–126)
Anion gap: 7 (ref 5–15)
BUN: 20 mg/dL (ref 8–23)
CO2: 26 mmol/L (ref 22–32)
Calcium: 9.2 mg/dL (ref 8.9–10.3)
Chloride: 104 mmol/L (ref 98–111)
Creatinine, Ser: 0.81 mg/dL (ref 0.44–1.00)
GFR, Estimated: >60 mL/min (ref >60)
Glucose, Bld: 194 mg/dL — ABNORMAL HIGH (ref 70–99)
Potassium: 3.9 mmol/L (ref 3.5–5.1)
Sodium: 137 mmol/L (ref 135–145)
Total Bilirubin: 0.4 mg/dL (ref 0.3–1.2)
Total Protein: 7.2 g/dL (ref 6.5–8.1)

## 2022-01-03 LAB — CBC WITH DIFFERENTIAL/PLATELET
Abs Immature Granulocytes: 0.03 10*3/uL (ref 0.00–0.07)
Basophils Absolute: 0 10*3/uL (ref 0.0–0.1)
Basophils Relative: 1 %
Eosinophils Absolute: 0.2 10*3/uL (ref 0.0–0.5)
Eosinophils Relative: 3 %
HCT: 31.7 % — ABNORMAL LOW (ref 36.0–46.0)
Hemoglobin: 10.1 g/dL — ABNORMAL LOW (ref 12.0–15.0)
Immature Granulocytes: 1 %
Lymphocytes Relative: 22 %
Lymphs Abs: 1.3 10*3/uL (ref 0.7–4.0)
MCH: 29.8 pg (ref 26.0–34.0)
MCHC: 31.9 g/dL (ref 30.0–36.0)
MCV: 93.5 fL (ref 80.0–100.0)
Monocytes Absolute: 0.3 10*3/uL (ref 0.1–1.0)
Monocytes Relative: 6 %
Neutro Abs: 4 10*3/uL (ref 1.7–7.7)
Neutrophils Relative %: 67 %
Platelets: 271 10*3/uL (ref 150–400)
RBC: 3.39 MIL/uL — ABNORMAL LOW (ref 3.87–5.11)
RDW: 14 % (ref 11.5–15.5)
WBC: 5.8 10*3/uL (ref 4.0–10.5)
nRBC: 0 % (ref 0.0–0.2)

## 2022-01-03 LAB — CK: Total CK: 59 U/L (ref 38–234)

## 2022-01-03 MED ORDER — METHYLPREDNISOLONE SODIUM SUCC 125 MG IJ SOLR
10.0000 mg | Freq: Once | INTRAMUSCULAR | Status: AC
  Administered 2022-01-03: 10 mg via INTRAVENOUS
  Filled 2022-01-03: qty 2

## 2022-01-03 MED ORDER — IMMUNE GLOBULIN (HUMAN) 20 GM/200ML IV SOLN: 500.0000 mg/kg | INTRAVENOUS | Status: DC

## 2022-01-03 MED ORDER — DEXTROSE 5 % IV SOLN
Freq: Once | INTRAVENOUS | Status: AC
  Filled 2022-01-03: qty 250

## 2022-01-03 MED ORDER — METHYLPREDNISOLONE SODIUM SUCC 40 MG IJ SOLR: 10.0000 mg | Freq: Once | INTRAMUSCULAR | Status: DC

## 2022-01-03 MED ORDER — IMMUNE GLOBULIN (HUMAN) 40 GM/400ML IV SOLN
400.0000 mL | Freq: Once | INTRAVENOUS | Status: AC
  Administered 2022-01-03: 40 g via INTRAVENOUS
  Filled 2022-01-03: qty 400

## 2022-01-03 MED ORDER — DIPHENHYDRAMINE HCL 25 MG PO CAPS
25.0000 mg | ORAL_CAPSULE | Freq: Once | ORAL | Status: AC
  Administered 2022-01-03: 25 mg via ORAL
  Filled 2022-01-03: qty 1

## 2022-01-03 MED ORDER — ACETAMINOPHEN 325 MG PO TABS
650.0000 mg | ORAL_TABLET | Freq: Once | ORAL | Status: AC
  Administered 2022-01-03: 650 mg via ORAL
  Filled 2022-01-03: qty 2

## 2022-01-03 NOTE — Progress Notes (Signed)
[facility] CONSULT NOTE  Patient Care Team: [name], MD as PCP - General [name] Susann Givens., MD (Rheumatology)  CHIEF COMPLAINTS/PURPOSE OF CONSULTATION: Polymyositis/IVIG infusions  #Polymyositis [Dr. [name]; 2019; left arm biopsy]; low dose prednisone/methotrexate/IVIG infusions 400 mg kilogram daily x4 days every 6 weeks  Oncology History   No history exists.     HISTORY OF PRESENTING ILLNESS: Alone.  Walks independently.  Bridget Romero 76 y.o.  female with a history of polymyositis is here for follow-up/proceed with IVIG infusions.  Patient denies any worsening weakness in arms or legs.  No falls.  No headaches.  No nausea no vomiting no fevers or chills.   Review of Systems  Constitutional:  Positive for malaise/fatigue. Negative for chills, diaphoresis, fever and weight loss.  HENT:  Negative for nosebleeds and sore throat.   Eyes:  Negative for double vision.  Respiratory:  Negative for cough, hemoptysis, sputum production, shortness of breath and wheezing.   Cardiovascular:  Negative for chest pain, palpitations, orthopnea and leg swelling.  Gastrointestinal:  Negative for abdominal pain, blood in stool, constipation, diarrhea, heartburn, melena, nausea and vomiting.  Genitourinary:  Negative for dysuria, frequency and urgency.  Musculoskeletal:  Negative for back pain and joint pain.  Skin: Negative.  Negative for itching and rash.  Neurological:  Negative for dizziness, tingling, focal weakness, weakness and headaches.  Endo/Heme/Allergies:  Does not bruise/bleed easily.  Psychiatric/Behavioral:  Negative for depression. The patient is not nervous/anxious and does not have insomnia.     MEDICAL HISTORY:  Past Medical History:  Diagnosis Date   Allergy    Asthma    Diabetes mellitus    Hyperlipidemia    Hypertension    Neuromuscular disorder ([place])    polymyositis   Osteoarthritis    Vitamin B12 deficiency     SURGICAL  HISTORY: Past Surgical History:  Procedure Laterality Date   COLONOSCOPY WITH PROPOFOL N/A 03/07/2016   Procedure: COLONOSCOPY WITH PROPOFOL;  Surgeon: Lollie Sails, MD;  Location: [facility] ENDOSCOPY;  Service: Endoscopy;  Laterality: N/A;    SOCIAL HISTORY: Social History   Socioeconomic History   Marital status: Widowed    Spouse name: Not on file   Number of children: 2   Years of education: Not on file   Highest education level: Not on file  Occupational History   Occupation: Retired as Training and development officer at [employer]:    Tobacco Use   Smoking status: Former   Smokeless tobacco: Never   Tobacco comments:    age 27 when stopped smoking  Vaping Use   Vaping Use: Never used  Substance and Sexual Activity   Alcohol use: No   Drug use: No   Sexual activity: Not on file  Other Topics Concern   Not on file  Social History Narrative   has living will   Son and daughter should make health care decisions for her   Would accept resuscitation   Not sure about tube feeds   Social Determinants of Health   Financial Resource Strain: Not on file  Food Insecurity: Not on file  Transportation Needs: Not on file  Physical Activity: Not on file  Stress: Not on file  Social Connections: Not on file  Intimate Partner Violence: Not on file    FAMILY HISTORY: Family History  Problem Relation Age of Onset   Heart disease Mother    Cancer Father    Breast cancer Neg Hx  ALLERGIES:  is allergic to atorvastatin and pravastatin.  MEDICATIONS:  Current Outpatient Medications  Medication Sig Dispense Refill   Accu-Chek FastClix Lancets MISC USE TO TEST BLOOD SUGAR TWICE DAILY DX E11.40 102 each 11   acetaminophen (TYLENOL) 500 MG tablet Take 500 mg by mouth every 6 (six) hours as needed.     Alcohol Swabs (B-D SINGLE USE SWABS REGULAR) PADS Use to test blood sugar once daily dx: 250.00 100 each 3   Blood Glucose Calibration (ACCU-CHEK AVIVA) SOLN Use as  directed 1 each 3   Cholecalciferol (VITAMIN D) 1000 UNITS capsule Take 1,000 Units by mouth daily.     folic acid (FOLVITE) 1 MG tablet Take 1 mg by mouth daily.     gabapentin (NEURONTIN) 300 MG capsule Take 1 capsule by mouth 3 (three) times daily.     glucose blood (ACCU-CHEK SMARTVIEW) test strip USE AS DIRECTED TO CHECK BLOOD SUGAR TWICE DAILY 100 each 6   insulin glargine (LANTUS) 100 UNIT/ML injection Inject 0.1 mLs (10 Units total) into the skin daily. 10 mL 3   lisinopril (ZESTRIL) 10 MG tablet TAKE 1 TABLET BY MOUTH ONCE A DAY 90 tablet 1   magnesium citrate SOLN Take 148 mLs by mouth once a week.     metFORMIN (GLUCOPHAGE) 1000 MG tablet TAKE 1 TABLET BY MOUTH TWICE A DAY 180 tablet 3   methotrexate 250 MG/10ML injection Inject into the skin.      Multiple Vitamin (MULTIVITAMIN) tablet Take 1 tablet by mouth daily.     predniSONE (DELTASONE) 2.5 MG tablet Take 2.5 mg by mouth daily.     trospium (SANCTURA) 20 MG tablet Take 1 tab 1 hour prior to bedtime 30 tablet 11   No current facility-administered medications for this visit.   Facility-Administered Medications Ordered in Other Visits  Medication Dose Route Frequency Provider Last Rate Last Admin   acetaminophen (TYLENOL) tablet 650 mg  650 mg Oral Once Charlaine Dalton R, MD       dextrose 5 % solution   Intravenous Continuous Cammie Sickle, MD   Stopped at 08/30/21 1350   dextrose 5 % solution   Intravenous Once Charlaine Dalton R, MD       diphenhydrAMINE (BENADRYL) capsule 25 mg  25 mg Oral Once Cammie Sickle, MD       Immune Globulin 10% (PRIVIGEN) IV infusion 45 g  500 mg/kg Intravenous Q24H Charlaine Dalton R, MD       methylPREDNISolone sodium succinate (SOLU-MEDROL) 125 mg/2 mL injection 10 mg  10 mg Intravenous Once Cammie Sickle, MD          .  PHYSICAL EXAMINATION: ECOG PERFORMANCE STATUS: 0 - Asymptomatic  Vitals:   01/03/22 0830  BP: (!) 146/67  Pulse: 83  Temp: (!) 95.8  F (35.4 C)  SpO2: 100%   Filed Weights   01/03/22 0830  Weight: 198 lb 9.6 oz (90.1 kg)    Physical Exam Vitals and nursing note reviewed.  Constitutional:      Comments:    HENT:     Head: Normocephalic and atraumatic.     Mouth/Throat:     Pharynx: Oropharynx is clear.  Eyes:     Extraocular Movements: Extraocular movements intact.     Pupils: Pupils are equal, round, and reactive to light.  Cardiovascular:     Rate and Rhythm: Normal rate and regular rhythm.  Pulmonary:     Comments: Decreased breath sounds bilaterally.  Abdominal:  Palpations: Abdomen is soft.  Musculoskeletal:        General: Normal range of motion.     Cervical back: Normal range of motion.  Skin:    General: Skin is warm.  Neurological:     General: No focal deficit present.     Mental Status: She is alert and oriented to person, place, and time.  Psychiatric:        Behavior: Behavior normal.        Judgment: Judgment normal.    LABORATORY DATA:  I have reviewed the data as listed Lab Results  Component Value Date   WBC 5.8 01/03/2022   HGB 10.1 (L) 01/03/2022   HCT 31.7 (L) 01/03/2022   MCV 93.5 01/03/2022   PLT 271 01/03/2022   Recent Labs    10/11/21 0813 11/22/21 0808 01/03/22 0802  NA 137 138 137  K 4.2 4.0 3.9  CL 105 105 104  CO2 27 26 26   GLUCOSE 176* 191* 194*  BUN 23 21 20   CREATININE 0.80 0.87 0.81  CALCIUM 9.5 9.0 9.2  GFRNONAA >60 >60 >60  PROT 7.1 7.1 7.2  ALBUMIN 3.7 3.9 3.6  AST 17 19 19   ALT 11 11 11   ALKPHOS 51 52 50  BILITOT 0.8 0.8 0.4    RADIOGRAPHIC STUDIES: I have personally reviewed the radiological images as listed and agreed with the findings in the report. No results found.  ASSESSMENT & PLAN:   Polymyositis, organ involvement unspecified ([place]) # Polymyositis [Dr. [name]; Clinically, she is doing well on IVIG 2 g/kg every 6 weeks; also prednisone 2.5 mg a day and MTX 10 mg SQ weekly.; CK is 69 (normal); Aldolase is 3.8  #  Continue IVIG every 6 weeks.   Begin IVIG x4 days beginning today. STABLE.   # Normocytic anemia: chronic Hb ~9-10. SEP 2022- iron sat -38%; ferritin- 60- monitor for now.rule out hemolysis; STABLE.   #As per insurance moving forward-patient will not get infusions in our clinic.  As per insurance-either at patient's home [patient not interested]; a same-day surgery/ambulatory infusion.  Will defer to Dr. [name] for further arrangements.  Our office has reached out/ informed Dr. [name] office in this regards.  # Disposition:  # proceed with infusion today/this week # follow up as needed- Dr.B.  Cc; Dr.Kernodle.   All questions were answered. The patient knows to call the clinic with any problems, questions or concerns.    Cammie Sickle, MD 01/03/2022 9:02 AM

## 2022-01-03 NOTE — Assessment & Plan Note (Addendum)
#   Polymyositis [Dr. [name]; Clinically, she is doing well on IVIG 2 g/kg every 6 weeks; also prednisone 2.5 mg a day and MTX10mg SQweekly.; CK is 69 (normal); Aldolase is 3.8  # Continue IVIG every 6 weeks.   Begin IVIG x4 days beginning today. STABLE.   # Normocytic anemia: chronic Hb ~9-10. SEP 2022- iron sat -38%; ferritin- 60- monitor for now.rule out hemolysis; STABLE.   #As per insurance moving forward-patient will not get infusions in our clinic.  As per insurance-either at patient's home [patient not interested]; a same-day surgery/ambulatory infusion.  Will defer to Dr. [name] for further arrangements.  Our office has reached out/ informed Dr. [name] office in this regards.  # Disposition:  # proceed with infusion today/this week # follow up as needed- Dr.B.  Cc; Dr.Kernodle.

## 2022-01-04 ENCOUNTER — Inpatient Hospital Stay: Payer: Medicare PPO

## 2022-01-04 VITALS — BP 143/64 | HR 82 | Temp 97.0°F | Resp 18 | Wt 198.6 lb

## 2022-01-04 DIAGNOSIS — M3320 Polymyositis, organ involvement unspecified: Secondary | ICD-10-CM | POA: Diagnosis not present

## 2022-01-04 LAB — HAPTOGLOBIN: Haptoglobin: 164 mg/dL (ref 42–346)

## 2022-01-04 LAB — ALDOLASE: Aldolase: 3.1 U/L — ABNORMAL LOW (ref 3.3–10.3)

## 2022-01-04 MED ORDER — METHYLPREDNISOLONE SODIUM SUCC 125 MG IJ SOLR
10.0000 mg | Freq: Once | INTRAMUSCULAR | Status: AC
  Administered 2022-01-04: 10 mg via INTRAVENOUS
  Filled 2022-01-04: qty 2

## 2022-01-04 MED ORDER — IMMUNE GLOBULIN (HUMAN) 5 GM/50ML IV SOLN
45.0000 g | INTRAVENOUS | Status: DC
  Administered 2022-01-04: 45 g via INTRAVENOUS
  Filled 2022-01-04 (×2): qty 400

## 2022-01-04 MED ORDER — METHYLPREDNISOLONE SODIUM SUCC 40 MG IJ SOLR: 10.0000 mg | Freq: Once | INTRAMUSCULAR | Status: DC

## 2022-01-04 MED ORDER — DIPHENHYDRAMINE HCL 25 MG PO CAPS
25.0000 mg | ORAL_CAPSULE | Freq: Once | ORAL | Status: AC
  Administered 2022-01-04: 25 mg via ORAL
  Filled 2022-01-04: qty 1

## 2022-01-04 MED ORDER — DEXTROSE 5 % IV SOLN
Freq: Once | INTRAVENOUS | Status: AC
  Filled 2022-01-04: qty 250

## 2022-01-04 MED ORDER — ACETAMINOPHEN 325 MG PO TABS
650.0000 mg | ORAL_TABLET | Freq: Once | ORAL | Status: AC
  Administered 2022-01-04: 650 mg via ORAL
  Filled 2022-01-04: qty 2

## 2022-01-05 ENCOUNTER — Other Ambulatory Visit: Payer: Self-pay

## 2022-01-05 ENCOUNTER — Inpatient Hospital Stay: Payer: Medicare PPO

## 2022-01-05 VITALS — BP 144/74 | HR 81 | Temp 97.5°F | Resp 18

## 2022-01-05 DIAGNOSIS — M3320 Polymyositis, organ involvement unspecified: Secondary | ICD-10-CM

## 2022-01-05 DIAGNOSIS — Z79899 Other long term (current) drug therapy: Secondary | ICD-10-CM

## 2022-01-05 MED ORDER — METHYLPREDNISOLONE SODIUM SUCC 125 MG IJ SOLR
10.0000 mg | Freq: Once | INTRAMUSCULAR | Status: AC
  Administered 2022-01-05: 10 mg via INTRAVENOUS
  Filled 2022-01-05: qty 2

## 2022-01-05 MED ORDER — METHYLPREDNISOLONE SODIUM SUCC 40 MG IJ SOLR: 10.0000 mg | Freq: Once | INTRAMUSCULAR | Status: DC

## 2022-01-05 MED ORDER — DEXTROSE 5 % IV SOLN
INTRAVENOUS | Status: DC
  Filled 2022-01-05: qty 250

## 2022-01-05 MED ORDER — DIPHENHYDRAMINE HCL 25 MG PO CAPS
25.0000 mg | ORAL_CAPSULE | Freq: Once | ORAL | Status: AC
  Administered 2022-01-05: 25 mg via ORAL
  Filled 2022-01-05: qty 1

## 2022-01-05 MED ORDER — ACETAMINOPHEN 325 MG PO TABS
650.0000 mg | ORAL_TABLET | Freq: Once | ORAL | Status: AC
  Administered 2022-01-05: 650 mg via ORAL
  Filled 2022-01-05: qty 2

## 2022-01-05 MED ORDER — IMMUNE GLOBULIN (HUMAN) 5 GM/50ML IV SOLN
45.0000 g | INTRAVENOUS | Status: DC
  Administered 2022-01-05: 45 g via INTRAVENOUS
  Filled 2022-01-05: qty 400

## 2022-01-05 NOTE — Patient Instructions (Signed)
[facility] AT [place]  Discharge Instructions: Thank you for choosing [facility] to provide your oncology and hematology care.  If you have a lab appointment with the [facility], please go directly to the [facility] and check in at the registration area.  Wear comfortable clothing and clothing appropriate for easy access to any Portacath or PICC line.   We strive to give you quality time with your provider. You may need to reschedule your appointment if you arrive late (15 or more minutes).  Arriving late affects you and other patients whose appointments are after yours.  Also, if you miss three or more appointments without notifying the office, you may be dismissed from the clinic at the providers discretion.      For prescription refill requests, have your pharmacy contact our office and allow 72 hours for refills to be completed.    Today you received the following chemotherapy and/or immunotherapy agents IVIG      To help prevent nausea and vomiting after your treatment, we encourage you to take your nausea medication as directed.  BELOW ARE SYMPTOMS THAT SHOULD BE REPORTED IMMEDIATELY: *FEVER GREATER THAN 100.4 F (38 C) OR HIGHER *CHILLS OR SWEATING *NAUSEA AND VOMITING THAT IS NOT CONTROLLED WITH YOUR NAUSEA MEDICATION *UNUSUAL SHORTNESS OF BREATH *UNUSUAL BRUISING OR BLEEDING *URINARY PROBLEMS (pain or burning when urinating, or frequent urination) *BOWEL PROBLEMS (unusual diarrhea, constipation, pain near the anus) TENDERNESS IN MOUTH AND THROAT WITH OR WITHOUT PRESENCE OF ULCERS (sore throat, sores in mouth, or a toothache) UNUSUAL RASH, SWELLING OR PAIN  UNUSUAL VAGINAL DISCHARGE OR ITCHING   Items with * indicate a potential emergency and should be followed up as soon as possible or go to the Emergency Department if any problems should occur.  Please show the CHEMOTHERAPY ALERT CARD or IMMUNOTHERAPY ALERT CARD at check-in to the  Emergency Department and triage nurse.  Should you have questions after your visit or need to cancel or reschedule your appointment, please contact [facility] [place] [facility] [place]  [phone] and follow the prompts.  Office hours are 8:00 a.m. to 4:30 p.m. Monday - Friday. Please note that voicemails left after 4:00 p.m. may not be returned until the following business day.  We are closed weekends and major holidays. You have access to a nurse at all times for urgent questions. Please call the main number to the clinic [phone] and follow the prompts.  For any non-urgent questions, you may also contact your provider using MyChart. We now offer e-Visits for anyone 33 and older to request care online for non-urgent symptoms. For details visit [email or web address].   Also download the MyChart app! Go to the app store, search "MyChart", open the app, select [facility], and log in with your MyChart username and password.  Due to Covid, a mask is required upon entering the hospital/clinic. If you do not have a mask, one will be given to you upon arrival. For doctor visits, patients may have 1 support person aged 62 or older with them. For treatment visits, patients cannot have anyone with them due to current Covid guidelines and our immunocompromised population.   Immune Globulin Injection What is this medication? IMMUNE GLOBULIN (im MUNE  GLOB yoo lin) helps to prevent or reduce the severity of certain infections in patients who are at risk. This medicine is collected from the pooled blood of many donors. It is used to treat immune system problems, thrombocytopenia, and Kawasaki syndrome. This  medicine may be used for other purposes; ask your health care provider or pharmacist if you have questions. COMMON BRAND NAME(S): ASCENIV, Baygam, BIVIGAM, Carimune, Carimune NF, cutaquig, Cuvitru, Flebogamma, Flebogamma DIF, GamaSTAN, GamaSTAN S/D, Gamimune N, Gammagard, Gammagard S/D,  Gammaked, Gammaplex, Gammar-P IV, Gamunex, Gamunex-C, Hizentra, Iveegam, Iveegam EN, Octagam, Panglobulin, Panglobulin NF, panzyga, Polygam S/D, Privigen, Sandoglobulin, Venoglobulin-S, Vigam, Vivaglobulin, Xembify What should I tell my care team before I take this medication? They need to know if you have any of these conditions: diabetes extremely low or no immune antibodies in the blood heart disease history of blood clots hyperprolinemia infection in the blood, sepsis kidney disease recently received or scheduled to receive a vaccination an unusual or allergic reaction to human immune globulin, albumin, maltose, sucrose, other medicines, foods, dyes, or preservatives pregnant or trying to get pregnant breast-feeding How should I use this medication? This medicine is for injection into a muscle or infusion into a vein or skin. It is usually given by a health care professional in a hospital or clinic setting. In rare cases, some brands of this medicine might be given at home. You will be taught how to give this medicine. Use exactly as directed. Take your medicine at regular intervals. Do not take your medicine more often than directed. Talk to your pediatrician regarding the use of this medicine in children. While this drug may be prescribed for selected conditions, precautions do apply. Overdosage: If you think you have taken too much of this medicine contact a poison control center or emergency room at once. NOTE: This medicine is only for you. Do not share this medicine with others. What if I miss a dose? It is important not to miss your dose. Call your doctor or health care professional if you are unable to keep an appointment. If you give yourself the medicine and you miss a dose, take it as soon as you can. If it is almost time for your next dose, take only that dose. Do not take double or extra doses. What may interact with this medication? aspirin and aspirin-like  medicines cisplatin cyclosporine medicines for infection like acyclovir, adefovir, amphotericin B, bacitracin, cidofovir, foscarnet, ganciclovir, gentamicin, pentamidine, vancomycin NSAIDS, medicines for pain and inflammation, like ibuprofen or naproxen pamidronate vaccines zoledronic acid This list may not describe all possible interactions. Give your health care provider a list of all the medicines, herbs, non-prescription drugs, or dietary supplements you use. Also tell them if you smoke, drink alcohol, or use illegal drugs. Some items may interact with your medicine. What should I watch for while using this medication? Your condition will be monitored carefully while you are receiving this medicine. This medicine is made from pooled blood donations of many different people. It may be possible to pass an infection in this medicine. However, the donors are screened for infections and all products are tested for HIV and hepatitis. The medicine is treated to kill most or all bacteria and viruses. Talk to your doctor about the risks and benefits of this medicine. Do not have vaccinations for at least 14 days before, or until at least 3 months after receiving this medicine. What side effects may I notice from receiving this medication? Side effects that you should report to your doctor or health care professional as soon as possible: allergic reactions like skin rash, itching or hives, swelling of the face, lips, or tongue blue colored lips or skin breathing problems chest pain or tightness fever signs and symptoms of aseptic meningitis  such as stiff neck; sensitivity to light; headache; drowsiness; fever; nausea; vomiting; rash signs and symptoms of a blood clot such as chest pain; shortness of breath; pain, swelling, or warmth in the leg signs and symptoms of hemolytic anemia such as fast heartbeat; tiredness; dark yellow or brown urine; or yellowing of the eyes or skin signs and symptoms of  kidney injury like trouble passing urine or change in the amount of urine sudden weight gain swelling of the ankles, feet, hands Side effects that usually do not require medical attention (report to your doctor or health care professional if they continue or are bothersome): diarrhea flushing headache increased sweating joint pain muscle cramps muscle pain nausea pain, redness, or irritation at site where injected tiredness This list may not describe all possible side effects. Call your doctor for medical advice about side effects. You may report side effects to FDA at 1-800-FDA-1088. Where should I keep my medication? Keep out of the reach of children. This drug is usually given in a hospital or clinic and will not be stored at home. In rare cases, some brands of this medicine may be given at home. If you are using this medicine at home, you will be instructed on how to store this medicine. Throw away any unused medicine after the expiration date on the label. NOTE: This sheet is a summary. It may not cover all possible information. If you have questions about this medicine, talk to your doctor, pharmacist, or health care provider.  2022 Elsevier/Gold Standard (2019-07-16 00:00:00)

## 2022-01-06 ENCOUNTER — Inpatient Hospital Stay: Payer: Medicare PPO

## 2022-01-06 VITALS — BP 173/86 | HR 79 | Temp 96.5°F | Resp 18

## 2022-01-06 DIAGNOSIS — M3320 Polymyositis, organ involvement unspecified: Secondary | ICD-10-CM | POA: Diagnosis not present

## 2022-01-06 MED ORDER — DIPHENHYDRAMINE HCL 25 MG PO CAPS
25.0000 mg | ORAL_CAPSULE | Freq: Once | ORAL | Status: AC
  Administered 2022-01-06: 25 mg via ORAL
  Filled 2022-01-06: qty 1

## 2022-01-06 MED ORDER — IMMUNE GLOBULIN (HUMAN) 5 GM/50ML IV SOLN
45.0000 g | INTRAVENOUS | Status: DC
  Administered 2022-01-06: 45 g via INTRAVENOUS
  Filled 2022-01-06: qty 400

## 2022-01-06 MED ORDER — METHYLPREDNISOLONE SODIUM SUCC 125 MG IJ SOLR
10.0000 mg | Freq: Once | INTRAMUSCULAR | Status: AC
  Administered 2022-01-06: 10 mg via INTRAVENOUS
  Filled 2022-01-06: qty 2

## 2022-01-06 MED ORDER — DEXTROSE 5 % IV SOLN
INTRAVENOUS | Status: DC
  Filled 2022-01-06: qty 250

## 2022-01-06 MED ORDER — ACETAMINOPHEN 325 MG PO TABS
650.0000 mg | ORAL_TABLET | Freq: Once | ORAL | Status: AC
  Administered 2022-01-06: 650 mg via ORAL
  Filled 2022-01-06: qty 2

## 2022-01-12 DIAGNOSIS — I471 Supraventricular tachycardia: Secondary | ICD-10-CM | POA: Diagnosis not present

## 2022-01-12 DIAGNOSIS — E782 Mixed hyperlipidemia: Secondary | ICD-10-CM | POA: Diagnosis not present

## 2022-01-12 DIAGNOSIS — I340 Nonrheumatic mitral (valve) insufficiency: Secondary | ICD-10-CM | POA: Diagnosis not present

## 2022-01-12 DIAGNOSIS — R55 Syncope and collapse: Secondary | ICD-10-CM | POA: Diagnosis not present

## 2022-01-12 DIAGNOSIS — R0602 Shortness of breath: Secondary | ICD-10-CM | POA: Diagnosis not present

## 2022-01-12 DIAGNOSIS — I10 Essential (primary) hypertension: Secondary | ICD-10-CM | POA: Diagnosis not present

## 2022-02-06 ENCOUNTER — Encounter: Payer: Self-pay | Admitting: Hematology and Oncology

## 2022-02-14 ENCOUNTER — Other Ambulatory Visit: Payer: Self-pay

## 2022-02-14 ENCOUNTER — Ambulatory Visit
Admission: RE | Admit: 2022-02-14 | Discharge: 2022-02-14 | Disposition: A | Discharge status: Home / Self Care | Payer: Medicare PPO | Source: Ambulatory Visit | Attending: Internal Medicine | Admitting: Internal Medicine

## 2022-02-14 DIAGNOSIS — M3320 Polymyositis, organ involvement unspecified: Secondary | ICD-10-CM | POA: Diagnosis not present

## 2022-02-14 LAB — CBC WITH DIFFERENTIAL/PLATELET
Abs Immature Granulocytes: 0.01 10*3/uL (ref 0.00–0.07)
Basophils Absolute: 0.1 10*3/uL (ref 0.0–0.1)
Basophils Relative: 1 %
Eosinophils Absolute: 0.2 10*3/uL (ref 0.0–0.5)
Eosinophils Relative: 3 %
HCT: 31.1 % — ABNORMAL LOW (ref 36.0–46.0)
Hemoglobin: 9.6 g/dL — ABNORMAL LOW (ref 12.0–15.0)
Immature Granulocytes: 0 %
Lymphocytes Relative: 21 %
Lymphs Abs: 1.2 10*3/uL (ref 0.7–4.0)
MCH: 28.7 pg (ref 26.0–34.0)
MCHC: 30.9 g/dL (ref 30.0–36.0)
MCV: 92.8 fL (ref 80.0–100.0)
Monocytes Absolute: 0.3 10*3/uL (ref 0.1–1.0)
Monocytes Relative: 6 %
Neutro Abs: 3.9 10*3/uL (ref 1.7–7.7)
Neutrophils Relative %: 69 %
Platelets: 300 10*3/uL (ref 150–400)
RBC: 3.35 MIL/uL — ABNORMAL LOW (ref 3.87–5.11)
RDW: 14.4 % (ref 11.5–15.5)
WBC: 5.6 10*3/uL (ref 4.0–10.5)
nRBC: 0 % (ref 0.0–0.2)

## 2022-02-14 LAB — COMPREHENSIVE METABOLIC PANEL
ALT: 12 U/L (ref 0–44)
AST: 18 U/L (ref 15–41)
Albumin: 3.5 g/dL (ref 3.5–5.0)
Alkaline Phosphatase: 51 U/L (ref 38–126)
Anion gap: 9 (ref 5–15)
BUN: 21 mg/dL (ref 8–23)
CO2: 25 mmol/L (ref 22–32)
Calcium: 9 mg/dL (ref 8.9–10.3)
Chloride: 107 mmol/L (ref 98–111)
Creatinine, Ser: 0.85 mg/dL (ref 0.44–1.00)
GFR, Estimated: >60 mL/min (ref >60)
Glucose, Bld: 122 mg/dL — ABNORMAL HIGH (ref 70–99)
Potassium: 4.1 mmol/L (ref 3.5–5.1)
Sodium: 141 mmol/L (ref 135–145)
Total Bilirubin: 0.7 mg/dL (ref 0.3–1.2)
Total Protein: 7 g/dL (ref 6.5–8.1)

## 2022-02-14 LAB — CK: Total CK: 81 U/L (ref 38–234)

## 2022-02-14 MED ORDER — ACETAMINOPHEN 325 MG PO TABS: 650.0000 mg | ORAL_TABLET | Freq: Every morning | ORAL | Status: DC

## 2022-02-14 MED ORDER — IMMUNE GLOBULIN (HUMAN) 5 GM/50ML IV SOLN
45.0000 g | Freq: Every evening | INTRAVENOUS | Status: DC
  Administered 2022-02-14: 45 g via INTRAVENOUS
  Filled 2022-02-14 (×2): qty 400

## 2022-02-14 MED ORDER — DIPHENHYDRAMINE HCL 25 MG PO CAPS: 25.0000 mg | ORAL_CAPSULE | Freq: Every morning | ORAL | Status: DC

## 2022-02-14 MED ORDER — METHYLPREDNISOLONE SODIUM SUCC 125 MG IJ SOLR
INTRAMUSCULAR | Status: AC
  Filled 2022-02-14: qty 2

## 2022-02-14 MED ORDER — ACETAMINOPHEN 325 MG PO TABS
ORAL_TABLET | ORAL | Status: AC
  Administered 2022-02-14: 650 mg via ORAL
  Filled 2022-02-14: qty 2

## 2022-02-14 MED ORDER — DIPHENHYDRAMINE HCL 25 MG PO CAPS
ORAL_CAPSULE | ORAL | Status: AC
  Administered 2022-02-14: 25 mg via ORAL
  Filled 2022-02-14: qty 1

## 2022-02-14 MED ORDER — METHYLPREDNISOLONE SODIUM SUCC 40 MG IJ SOLR
10.0000 mg | Freq: Every morning | INTRAMUSCULAR | Status: DC
  Administered 2022-02-14: 10 mg via INTRAVENOUS
  Filled 2022-02-14 (×2): qty 0.25

## 2022-02-15 ENCOUNTER — Ambulatory Visit
Admission: RE | Admit: 2022-02-15 | Discharge: 2022-02-15 | Disposition: A | Discharge status: Home / Self Care | Payer: Medicare PPO | Source: Ambulatory Visit | Attending: Internal Medicine | Admitting: Internal Medicine

## 2022-02-15 DIAGNOSIS — M3320 Polymyositis, organ involvement unspecified: Secondary | ICD-10-CM | POA: Diagnosis not present

## 2022-02-15 LAB — HAPTOGLOBIN: Haptoglobin: 143 mg/dL (ref 42–346)

## 2022-02-15 LAB — ALDOLASE: Aldolase: 4 U/L (ref 3.3–10.3)

## 2022-02-15 MED ORDER — ACETAMINOPHEN 325 MG PO TABS
650.0000 mg | ORAL_TABLET | Freq: Once | ORAL | Status: AC
  Administered 2022-02-15: 650 mg via ORAL

## 2022-02-15 MED ORDER — IMMUNE GLOBULIN (HUMAN) 10 GM/100ML IV SOLN
45.0000 g | Freq: Once | INTRAVENOUS | Status: AC
  Administered 2022-02-15: 45 g via INTRAVENOUS
  Filled 2022-02-15: qty 450

## 2022-02-15 MED ORDER — ACETAMINOPHEN 325 MG PO TABS
ORAL_TABLET | ORAL | Status: AC
  Filled 2022-02-15: qty 2

## 2022-02-15 MED ORDER — DIPHENHYDRAMINE HCL 25 MG PO CAPS
25.0000 mg | ORAL_CAPSULE | Freq: Once | ORAL | Status: AC
  Administered 2022-02-15: 25 mg via ORAL

## 2022-02-15 MED ORDER — DIPHENHYDRAMINE HCL 25 MG PO CAPS
ORAL_CAPSULE | ORAL | Status: AC
  Filled 2022-02-15: qty 1

## 2022-02-15 MED ORDER — METHYLPREDNISOLONE SODIUM SUCC 40 MG IJ SOLR
10.0000 mg | Freq: Once | INTRAMUSCULAR | Status: AC
  Administered 2022-02-15: 10 mg via INTRAVENOUS
  Filled 2022-02-15 (×3): qty 0.25

## 2022-02-16 ENCOUNTER — Other Ambulatory Visit: Payer: Self-pay

## 2022-02-16 ENCOUNTER — Ambulatory Visit
Admission: RE | Admit: 2022-02-16 | Discharge: 2022-02-16 | Disposition: A | Discharge status: Home / Self Care | Payer: Medicare PPO | Source: Ambulatory Visit | Attending: Rheumatology | Admitting: Rheumatology

## 2022-02-16 DIAGNOSIS — M3320 Polymyositis, organ involvement unspecified: Secondary | ICD-10-CM | POA: Diagnosis not present

## 2022-02-16 MED ORDER — ACETAMINOPHEN 325 MG PO TABS: 650.0000 mg | ORAL_TABLET | Freq: Once | ORAL | Status: AC

## 2022-02-16 MED ORDER — DIPHENHYDRAMINE HCL 25 MG PO CAPS
ORAL_CAPSULE | ORAL | Status: AC
  Administered 2022-02-16: 25 mg via ORAL
  Filled 2022-02-16: qty 1

## 2022-02-16 MED ORDER — ACETAMINOPHEN 325 MG PO TABS
ORAL_TABLET | ORAL | Status: AC
  Administered 2022-02-16: 650 mg via ORAL
  Filled 2022-02-16: qty 2

## 2022-02-16 MED ORDER — METHYLPREDNISOLONE SODIUM SUCC 40 MG IJ SOLR
10.0000 mg | Freq: Once | INTRAMUSCULAR | Status: DC
Start: 2022-02-16 — End: 2022-02-17
  Filled 2022-02-16: qty 0.25

## 2022-02-16 MED ORDER — DIPHENHYDRAMINE HCL 25 MG PO CAPS: 25.0000 mg | ORAL_CAPSULE | Freq: Once | ORAL | Status: AC

## 2022-02-16 MED ORDER — METHYLPREDNISOLONE SODIUM SUCC 125 MG IJ SOLR
INTRAMUSCULAR | Status: AC
  Administered 2022-02-16: 10 mg
  Filled 2022-02-16: qty 2

## 2022-02-16 MED ORDER — IMMUNE GLOBULIN (HUMAN) 10 GM/100ML IV SOLN
400.0000 mg/kg | INTRAVENOUS | Status: DC
  Administered 2022-02-16: 10 g via INTRAVENOUS
  Administered 2022-02-16: 20 g via INTRAVENOUS
  Filled 2022-02-16 (×2): qty 350

## 2022-02-17 ENCOUNTER — Ambulatory Visit
Admission: RE | Admit: 2022-02-17 | Discharge: 2022-02-17 | Disposition: A | Discharge status: Home / Self Care | Payer: Medicare PPO | Source: Ambulatory Visit | Attending: Internal Medicine | Admitting: Internal Medicine

## 2022-02-17 DIAGNOSIS — M3320 Polymyositis, organ involvement unspecified: Secondary | ICD-10-CM | POA: Diagnosis not present

## 2022-02-17 MED ORDER — DIPHENHYDRAMINE HCL 25 MG PO CAPS
ORAL_CAPSULE | ORAL | Status: AC
  Filled 2022-02-17: qty 1

## 2022-02-17 MED ORDER — ACETAMINOPHEN 325 MG PO TABS
650.0000 mg | ORAL_TABLET | Freq: Once | ORAL | Status: AC
  Administered 2022-02-17: 650 mg via ORAL

## 2022-02-17 MED ORDER — DIPHENHYDRAMINE HCL 25 MG PO CAPS
25.0000 mg | ORAL_CAPSULE | Freq: Once | ORAL | Status: AC
  Administered 2022-02-17: 25 mg via ORAL

## 2022-02-17 MED ORDER — METHYLPREDNISOLONE SODIUM SUCC 40 MG IJ SOLR
10.0000 mg | Freq: Once | INTRAMUSCULAR | Status: AC
  Administered 2022-02-17: 10 mg via INTRAVENOUS
  Filled 2022-02-17: qty 1

## 2022-02-17 MED ORDER — IMMUNE GLOBULIN (HUMAN) 10 GM/100ML IV SOLN
45.0000 g | Freq: Once | INTRAVENOUS | Status: AC
  Administered 2022-02-17: 45 g via INTRAVENOUS
  Filled 2022-02-17: qty 450

## 2022-02-17 MED ORDER — ACETAMINOPHEN 325 MG PO TABS
ORAL_TABLET | ORAL | Status: AC
  Filled 2022-02-17: qty 2

## 2022-03-16 DIAGNOSIS — R55 Syncope and collapse: Secondary | ICD-10-CM | POA: Diagnosis not present

## 2022-03-16 DIAGNOSIS — I10 Essential (primary) hypertension: Secondary | ICD-10-CM | POA: Diagnosis not present

## 2022-03-16 DIAGNOSIS — I471 Supraventricular tachycardia: Secondary | ICD-10-CM | POA: Diagnosis not present

## 2022-03-16 DIAGNOSIS — E669 Obesity, unspecified: Secondary | ICD-10-CM | POA: Diagnosis not present

## 2022-03-16 DIAGNOSIS — E782 Mixed hyperlipidemia: Secondary | ICD-10-CM | POA: Diagnosis not present

## 2022-03-16 DIAGNOSIS — I340 Nonrheumatic mitral (valve) insufficiency: Secondary | ICD-10-CM | POA: Diagnosis not present

## 2022-03-17 ENCOUNTER — Other Ambulatory Visit: Payer: Self-pay | Admitting: Internal Medicine

## 2022-03-17 DIAGNOSIS — I10 Essential (primary) hypertension: Secondary | ICD-10-CM

## 2022-04-11 ENCOUNTER — Ambulatory Visit
Admission: RE | Admit: 2022-04-11 | Discharge: 2022-04-11 | Disposition: A | Discharge status: Home / Self Care | Payer: Medicare PPO | Source: Ambulatory Visit | Attending: Rheumatology | Admitting: Rheumatology

## 2022-04-11 DIAGNOSIS — M3320 Polymyositis, organ involvement unspecified: Secondary | ICD-10-CM | POA: Insufficient documentation

## 2022-04-11 MED ORDER — DIPHENHYDRAMINE HCL 25 MG PO CAPS: 25.0000 mg | ORAL_CAPSULE | Freq: Once | ORAL | Status: AC

## 2022-04-11 MED ORDER — ACETAMINOPHEN 325 MG PO TABS
ORAL_TABLET | ORAL | Status: AC
  Administered 2022-04-11: 650 mg via ORAL
  Filled 2022-04-11: qty 2

## 2022-04-11 MED ORDER — DIPHENHYDRAMINE HCL 25 MG PO CAPS
ORAL_CAPSULE | ORAL | Status: AC
  Administered 2022-04-11: 25 mg via ORAL
  Filled 2022-04-11: qty 1

## 2022-04-11 MED ORDER — IMMUNE GLOBULIN (HUMAN) 10 GM/100ML IV SOLN
400.0000 mg/kg | Freq: Once | INTRAVENOUS | Status: AC
  Administered 2022-04-11: 35 g via INTRAVENOUS
  Filled 2022-04-11: qty 350

## 2022-04-11 MED ORDER — METHYLPREDNISOLONE SODIUM SUCC 40 MG IJ SOLR
10.0000 mg | Freq: Once | INTRAMUSCULAR | Status: AC
  Administered 2022-04-11: 10 mg via INTRAVENOUS
  Filled 2022-04-11: qty 1

## 2022-04-11 MED ORDER — ACETAMINOPHEN 325 MG PO TABS: 650.0000 mg | ORAL_TABLET | Freq: Once | ORAL | Status: AC

## 2022-04-12 ENCOUNTER — Ambulatory Visit
Admission: RE | Admit: 2022-04-12 | Discharge: 2022-04-12 | Disposition: A | Discharge status: Home / Self Care | Payer: Medicare PPO | Source: Ambulatory Visit | Attending: Rheumatology | Admitting: Rheumatology

## 2022-04-12 DIAGNOSIS — M3320 Polymyositis, organ involvement unspecified: Secondary | ICD-10-CM | POA: Insufficient documentation

## 2022-04-12 MED ORDER — DIPHENHYDRAMINE HCL 25 MG PO CAPS
25.0000 mg | ORAL_CAPSULE | Freq: Once | ORAL | Status: AC
  Administered 2022-04-13: 25 mg via ORAL

## 2022-04-12 MED ORDER — METHYLPREDNISOLONE SODIUM SUCC 40 MG IJ SOLR
10.0000 mg | Freq: Once | INTRAMUSCULAR | Status: AC
  Administered 2022-04-12: 10 mg via INTRAVENOUS
  Filled 2022-04-12: qty 0.25

## 2022-04-12 MED ORDER — IMMUNE GLOBULIN (HUMAN) 10 GM/100ML IV SOLN
400.0000 mg/kg | Freq: Once | INTRAVENOUS | Status: AC
  Administered 2022-04-13: 35 g via INTRAVENOUS
  Filled 2022-04-12 (×2): qty 350

## 2022-04-12 MED ORDER — IMMUNE GLOBULIN (HUMAN) 10 GM/100ML IV SOLN
400.0000 mg/kg | Freq: Once | INTRAVENOUS | Status: AC
  Administered 2022-04-12: 35 g via INTRAVENOUS
  Filled 2022-04-12: qty 350

## 2022-04-12 MED ORDER — ACETAMINOPHEN 325 MG PO TABS
650.0000 mg | ORAL_TABLET | Freq: Once | ORAL | Status: AC
  Administered 2022-04-13: 650 mg via ORAL

## 2022-04-12 MED ORDER — DIPHENHYDRAMINE HCL 25 MG PO CAPS
ORAL_CAPSULE | ORAL | Status: AC
  Administered 2022-04-12: 25 mg via ORAL
  Filled 2022-04-12: qty 1

## 2022-04-12 MED ORDER — METHYLPREDNISOLONE SODIUM SUCC 40 MG IJ SOLR
10.0000 mg | Freq: Once | INTRAMUSCULAR | Status: AC
  Administered 2022-04-13: 10 mg via INTRAVENOUS
  Filled 2022-04-12 (×2): qty 0.25

## 2022-04-12 MED ORDER — ACETAMINOPHEN 325 MG PO TABS
ORAL_TABLET | ORAL | Status: AC
  Administered 2022-04-12: 650 mg via ORAL
  Filled 2022-04-12: qty 2

## 2022-04-12 MED ORDER — ACETAMINOPHEN 325 MG PO TABS
650.0000 mg | ORAL_TABLET | Freq: Once | ORAL | Status: AC
Start: 2022-04-12 — End: 2022-04-12

## 2022-04-12 MED ORDER — DIPHENHYDRAMINE HCL 25 MG PO CAPS: 25.0000 mg | ORAL_CAPSULE | Freq: Once | ORAL | Status: AC

## 2022-04-12 NOTE — Progress Notes (Signed)
Pt received PRIVIGEN IV infusion per MD's order. Pt tolerated well. No c/o pain nor respiratory issues noted. VS WNL. Iv discontinued without difficulties. Post infusion flushed with 50 ml of D5W per protocol. IV discontinue without difficulties. Pt was discharged home post infusion. Continue to monitor. ?

## 2022-04-13 ENCOUNTER — Ambulatory Visit
Admission: RE | Admit: 2022-04-13 | Discharge: 2022-04-13 | Disposition: A | Discharge status: Home / Self Care | Payer: Medicare PPO | Source: Ambulatory Visit | Attending: Rheumatology | Admitting: Rheumatology

## 2022-04-13 DIAGNOSIS — M3320 Polymyositis, organ involvement unspecified: Secondary | ICD-10-CM | POA: Insufficient documentation

## 2022-04-13 MED ORDER — ACETAMINOPHEN 325 MG PO TABS
ORAL_TABLET | ORAL | Status: AC
  Filled 2022-04-13: qty 2

## 2022-04-13 MED ORDER — SODIUM CHLORIDE FLUSH 0.9 % IV SOLN
INTRAVENOUS | Status: AC
  Administered 2022-04-13: 10 mL
  Filled 2022-04-13: qty 20

## 2022-04-13 MED ORDER — DIPHENHYDRAMINE HCL 25 MG PO CAPS
ORAL_CAPSULE | ORAL | Status: AC
Start: 2022-04-13 — End: 2022-04-13
  Filled 2022-04-13: qty 1

## 2022-04-13 NOTE — Progress Notes (Signed)
Pt came in to get PRIVIGEN IV infusion per MD's order. Tolerated well. No c/o pain not respiratory issues noted. VS WNL. Post infusion flushed with 50 ml of D5W per order. IV discontinued without difficulties. Continue to monitor. ?

## 2022-04-14 ENCOUNTER — Ambulatory Visit
Admission: RE | Admit: 2022-04-14 | Discharge: 2022-04-14 | Disposition: A | Discharge status: Home / Self Care | Payer: Medicare PPO | Source: Ambulatory Visit | Attending: Rheumatology | Admitting: Rheumatology

## 2022-04-14 DIAGNOSIS — M3320 Polymyositis, organ involvement unspecified: Secondary | ICD-10-CM | POA: Diagnosis not present

## 2022-04-14 MED ORDER — METHYLPREDNISOLONE SODIUM SUCC 40 MG IJ SOLR
10.0000 mg | Freq: Once | INTRAMUSCULAR | Status: AC
  Administered 2022-04-14: 10 mg via INTRAVENOUS
  Filled 2022-04-14: qty 1

## 2022-04-14 MED ORDER — DIPHENHYDRAMINE HCL 25 MG PO CAPS: 25.0000 mg | ORAL_CAPSULE | Freq: Once | ORAL | Status: AC

## 2022-04-14 MED ORDER — SODIUM CHLORIDE FLUSH 0.9 % IV SOLN
INTRAVENOUS | Status: AC
Start: 2022-04-14 — End: 2022-04-14
  Filled 2022-04-14: qty 20

## 2022-04-14 MED ORDER — DIPHENHYDRAMINE HCL 25 MG PO CAPS
ORAL_CAPSULE | ORAL | Status: AC
  Administered 2022-04-14: 25 mg via ORAL
  Filled 2022-04-14: qty 1

## 2022-04-14 MED ORDER — ACETAMINOPHEN 325 MG PO TABS: 650.0000 mg | ORAL_TABLET | Freq: Once | ORAL | Status: AC

## 2022-04-14 MED ORDER — ACETAMINOPHEN 325 MG PO TABS
ORAL_TABLET | ORAL | Status: AC
  Administered 2022-04-14: 650 mg via ORAL
  Filled 2022-04-14: qty 2

## 2022-04-14 MED ORDER — IMMUNE GLOBULIN (HUMAN) 10 GM/100ML IV SOLN
400.0000 mg/kg | INTRAVENOUS | Status: DC
  Administered 2022-04-14: 35 g via INTRAVENOUS
  Filled 2022-04-14 (×2): qty 350

## 2022-04-14 NOTE — Progress Notes (Signed)
Pt came in for PRIVIGEN infusion per MD's order. Tolerated well, no c/o pain nor distress noted. VS WNL. Iv discontinued without difficulties. Continue to monitor. ?

## 2022-05-05 ENCOUNTER — Other Ambulatory Visit: Payer: Self-pay | Admitting: Internal Medicine

## 2022-05-12 ENCOUNTER — Other Ambulatory Visit: Payer: Self-pay | Admitting: Internal Medicine

## 2022-05-12 DIAGNOSIS — Z1231 Encounter for screening mammogram for malignant neoplasm of breast: Secondary | ICD-10-CM

## 2022-06-06 ENCOUNTER — Ambulatory Visit: Payer: Medicare PPO | Admitting: Internal Medicine

## 2022-06-06 ENCOUNTER — Ambulatory Visit
Admission: RE | Admit: 2022-06-06 | Discharge: 2022-06-06 | Disposition: A | Discharge status: Home / Self Care | Payer: Medicare PPO | Source: Ambulatory Visit | Attending: Internal Medicine | Admitting: Internal Medicine

## 2022-06-06 VITALS — BP 144/66 | HR 67 | Temp 97.7°F | Resp 16 | Ht 66.0 in | Wt 198.1 lb

## 2022-06-06 DIAGNOSIS — M3320 Polymyositis, organ involvement unspecified: Secondary | ICD-10-CM | POA: Diagnosis not present

## 2022-06-06 LAB — COMPREHENSIVE METABOLIC PANEL
ALT: 23 U/L (ref 0–44)
AST: 28 U/L (ref 15–41)
Albumin: 3.7 g/dL (ref 3.5–5.0)
Alkaline Phosphatase: 52 U/L (ref 38–126)
Anion gap: 9 (ref 5–15)
BUN: 22 mg/dL (ref 8–23)
CO2: 26 mmol/L (ref 22–32)
Calcium: 9.2 mg/dL (ref 8.9–10.3)
Chloride: 104 mmol/L (ref 98–111)
Creatinine, Ser: 0.78 mg/dL (ref 0.44–1.00)
GFR, Estimated: >60 mL/min (ref >60)
Glucose, Bld: 129 mg/dL — ABNORMAL HIGH (ref 70–99)
Potassium: 4.2 mmol/L (ref 3.5–5.1)
Sodium: 139 mmol/L (ref 135–145)
Total Bilirubin: 0.8 mg/dL (ref 0.3–1.2)
Total Protein: 7 g/dL (ref 6.5–8.1)

## 2022-06-06 LAB — CBC
HCT: 30.5 % — ABNORMAL LOW (ref 36.0–46.0)
Hemoglobin: 9.6 g/dL — ABNORMAL LOW (ref 12.0–15.0)
MCH: 28.5 pg (ref 26.0–34.0)
MCHC: 31.5 g/dL (ref 30.0–36.0)
MCV: 90.5 fL (ref 80.0–100.0)
Platelets: 301 10*3/uL (ref 150–400)
RBC: 3.37 MIL/uL — ABNORMAL LOW (ref 3.87–5.11)
RDW: 14.5 % (ref 11.5–15.5)
WBC: 5.2 10*3/uL (ref 4.0–10.5)
nRBC: 0 % (ref 0.0–0.2)

## 2022-06-06 LAB — CK: Total CK: 384 U/L — ABNORMAL HIGH (ref 38–234)

## 2022-06-06 MED ORDER — METHYLPREDNISOLONE SODIUM SUCC 40 MG IJ SOLR
10.0000 mg | Freq: Once | INTRAMUSCULAR | Status: AC
  Administered 2022-06-06: 10 mg via INTRAVENOUS
  Filled 2022-06-06: qty 0.25

## 2022-06-06 MED ORDER — DIPHENHYDRAMINE HCL 25 MG PO CAPS: 25.0000 mg | ORAL_CAPSULE | Freq: Once | ORAL | Status: AC

## 2022-06-06 MED ORDER — ACETAMINOPHEN 325 MG PO TABS: 650.0000 mg | ORAL_TABLET | Freq: Once | ORAL | Status: AC

## 2022-06-06 MED ORDER — DIPHENHYDRAMINE HCL 25 MG PO CAPS
ORAL_CAPSULE | ORAL | Status: AC
Start: 2022-06-06 — End: 2022-06-06
  Administered 2022-06-06: 25 mg via ORAL
  Filled 2022-06-06: qty 1

## 2022-06-06 MED ORDER — ACETAMINOPHEN 325 MG PO TABS
ORAL_TABLET | ORAL | Status: AC
  Administered 2022-06-06: 650 mg via ORAL
  Filled 2022-06-06: qty 2

## 2022-06-06 MED ORDER — IMMUNE GLOBULIN (HUMAN) 10 GM/100ML IV SOLN
400.0000 mg/kg | INTRAVENOUS | Status: DC
  Administered 2022-06-06: 10 g via INTRAVENOUS
  Administered 2022-06-06: 30 g via INTRAVENOUS
  Filled 2022-06-06 (×2): qty 300

## 2022-06-07 ENCOUNTER — Ambulatory Visit
Admission: RE | Admit: 2022-06-07 | Discharge: 2022-06-07 | Disposition: A | Discharge status: Home / Self Care | Payer: Medicare PPO | Source: Ambulatory Visit | Attending: Internal Medicine | Admitting: Internal Medicine

## 2022-06-07 DIAGNOSIS — M3320 Polymyositis, organ involvement unspecified: Secondary | ICD-10-CM | POA: Insufficient documentation

## 2022-06-07 LAB — ALDOLASE: Aldolase: 6.4 U/L (ref 3.3–10.3)

## 2022-06-07 LAB — HAPTOGLOBIN: Haptoglobin: 153 mg/dL (ref 42–346)

## 2022-06-07 MED ORDER — METHYLPREDNISOLONE SODIUM SUCC 40 MG IJ SOLR
10.0000 mg | Freq: Once | INTRAMUSCULAR | Status: AC
Start: 2022-06-07 — End: 2022-06-07
  Administered 2022-06-07: 10 mg via INTRAVENOUS
  Filled 2022-06-07: qty 1

## 2022-06-07 MED ORDER — METHYLPREDNISOLONE SODIUM SUCC 125 MG IJ SOLR
INTRAMUSCULAR | Status: AC
  Filled 2022-06-07: qty 2

## 2022-06-07 MED ORDER — SODIUM CHLORIDE FLUSH 0.9 % IV SOLN
INTRAVENOUS | Status: AC
  Filled 2022-06-07: qty 10

## 2022-06-07 MED ORDER — ACETAMINOPHEN 325 MG PO TABS
ORAL_TABLET | ORAL | Status: AC
  Filled 2022-06-07: qty 2

## 2022-06-07 MED ORDER — ACETAMINOPHEN 325 MG PO TABS
650.0000 mg | ORAL_TABLET | Freq: Once | ORAL | Status: AC
Start: 2022-06-07 — End: 2022-06-07
  Administered 2022-06-07: 650 mg via ORAL

## 2022-06-07 MED ORDER — DEXTROSE 5 % IV SOLN: INTRAVENOUS | Status: DC

## 2022-06-07 MED ORDER — DIPHENHYDRAMINE HCL 25 MG PO CAPS
25.0000 mg | ORAL_CAPSULE | Freq: Once | ORAL | Status: AC
Start: 2022-06-07 — End: 2022-06-07
  Administered 2022-06-07: 25 mg via ORAL

## 2022-06-07 MED ORDER — DIPHENHYDRAMINE HCL 25 MG PO CAPS
ORAL_CAPSULE | ORAL | Status: AC
  Filled 2022-06-07: qty 1

## 2022-06-07 MED ORDER — IMMUNE GLOBULIN (HUMAN) 10 GM/100ML IV SOLN
400.0000 mg/kg | INTRAVENOUS | Status: DC
  Administered 2022-06-07: 10 g via INTRAVENOUS
  Administered 2022-06-07: 30 g via INTRAVENOUS
  Filled 2022-06-07 (×2): qty 300

## 2022-06-08 ENCOUNTER — Ambulatory Visit
Admission: RE | Admit: 2022-06-08 | Discharge: 2022-06-08 | Disposition: A | Discharge status: Home / Self Care | Payer: Medicare PPO | Source: Ambulatory Visit | Attending: Internal Medicine | Admitting: Internal Medicine

## 2022-06-08 DIAGNOSIS — M3320 Polymyositis, organ involvement unspecified: Secondary | ICD-10-CM | POA: Diagnosis not present

## 2022-06-08 MED ORDER — DIPHENHYDRAMINE HCL 25 MG PO CAPS
25.0000 mg | ORAL_CAPSULE | Freq: Once | ORAL | Status: AC
  Administered 2022-06-08: 25 mg via ORAL

## 2022-06-08 MED ORDER — ACETAMINOPHEN 325 MG PO TABS
650.0000 mg | ORAL_TABLET | Freq: Once | ORAL | Status: AC
  Administered 2022-06-08: 650 mg via ORAL

## 2022-06-08 MED ORDER — METHYLPREDNISOLONE SODIUM SUCC 40 MG IJ SOLR
10.0000 mg | Freq: Once | INTRAMUSCULAR | Status: AC
  Administered 2022-06-08: 10 mg via INTRAVENOUS
  Filled 2022-06-08: qty 1

## 2022-06-08 MED ORDER — ACETAMINOPHEN 325 MG PO TABS
ORAL_TABLET | ORAL | Status: AC
  Filled 2022-06-08: qty 2

## 2022-06-08 MED ORDER — DEXTROSE 5 % IV SOLN: INTRAVENOUS | Status: DC

## 2022-06-08 MED ORDER — SODIUM CHLORIDE FLUSH 0.9 % IV SOLN
INTRAVENOUS | Status: AC
  Administered 2022-06-08: 10 mL
  Filled 2022-06-08: qty 10

## 2022-06-08 MED ORDER — SODIUM CHLORIDE 0.9% FLUSH
10.0000 mL | INTRAVENOUS | Status: DC | PRN
Start: 2022-06-08 — End: 2022-06-09

## 2022-06-08 MED ORDER — IMMUNE GLOBULIN (HUMAN) 10 GM/100ML IV SOLN
400.0000 mg/kg | INTRAVENOUS | Status: DC
  Administered 2022-06-08: 30 g via INTRAVENOUS
  Filled 2022-06-08 (×2): qty 300

## 2022-06-08 MED ORDER — DIPHENHYDRAMINE HCL 25 MG PO CAPS
ORAL_CAPSULE | ORAL | Status: AC
  Filled 2022-06-08: qty 1

## 2022-06-08 MED ORDER — SODIUM CHLORIDE FLUSH 0.9 % IV SOLN
INTRAVENOUS | Status: AC
  Administered 2022-06-08: 10 mL via INTRAVENOUS
  Filled 2022-06-08: qty 10

## 2022-06-09 ENCOUNTER — Ambulatory Visit
Admission: RE | Admit: 2022-06-09 | Discharge: 2022-06-09 | Disposition: A | Discharge status: Home / Self Care | Payer: Medicare PPO | Source: Ambulatory Visit | Attending: Internal Medicine | Admitting: Internal Medicine

## 2022-06-09 DIAGNOSIS — M3320 Polymyositis, organ involvement unspecified: Secondary | ICD-10-CM | POA: Diagnosis not present

## 2022-06-09 MED ORDER — IMMUNE GLOBULIN (HUMAN) 10 GM/100ML IV SOLN
400.0000 mg/kg | INTRAVENOUS | Status: DC
  Administered 2022-06-09: 30 g via INTRAVENOUS
  Filled 2022-06-09 (×2): qty 300

## 2022-06-09 MED ORDER — METHYLPREDNISOLONE SODIUM SUCC 40 MG IJ SOLR
10.0000 mg | Freq: Once | INTRAMUSCULAR | Status: AC
  Administered 2022-06-09: 10 mg via INTRAVENOUS
  Filled 2022-06-09: qty 1

## 2022-06-09 MED ORDER — DIPHENHYDRAMINE HCL 25 MG PO CAPS: 25.0000 mg | ORAL_CAPSULE | Freq: Once | ORAL | Status: AC

## 2022-06-09 MED ORDER — ACETAMINOPHEN 325 MG PO TABS: 650.0000 mg | ORAL_TABLET | Freq: Once | ORAL | Status: AC

## 2022-06-09 MED ORDER — ACETAMINOPHEN 325 MG PO TABS
ORAL_TABLET | ORAL | Status: AC
  Administered 2022-06-09: 650 mg via ORAL
  Filled 2022-06-09: qty 2

## 2022-06-09 MED ORDER — DIPHENHYDRAMINE HCL 25 MG PO CAPS
ORAL_CAPSULE | ORAL | Status: AC
  Administered 2022-06-09: 25 mg via ORAL
  Filled 2022-06-09: qty 1

## 2022-06-09 MED ORDER — DEXTROSE 5 % IV SOLN: INTRAVENOUS | Status: DC

## 2022-06-12 ENCOUNTER — Ambulatory Visit
Admission: RE | Admit: 2022-06-12 | Discharge: 2022-06-12 | Disposition: A | Discharge status: Home / Self Care | Payer: Medicare PPO | Source: Ambulatory Visit | Attending: Internal Medicine | Admitting: Internal Medicine

## 2022-06-12 DIAGNOSIS — Z1231 Encounter for screening mammogram for malignant neoplasm of breast: Secondary | ICD-10-CM | POA: Insufficient documentation

## 2022-06-13 ENCOUNTER — Encounter: Payer: Self-pay | Admitting: Internal Medicine

## 2022-06-13 ENCOUNTER — Ambulatory Visit: Payer: Medicare PPO | Admitting: Internal Medicine

## 2022-06-13 VITALS — BP 132/78 | HR 98 | Temp 97.3°F | Ht 66.5 in | Wt 197.0 lb

## 2022-06-13 DIAGNOSIS — Z794 Long term (current) use of insulin: Secondary | ICD-10-CM | POA: Diagnosis not present

## 2022-06-13 DIAGNOSIS — E1140 Type 2 diabetes mellitus with diabetic neuropathy, unspecified: Secondary | ICD-10-CM | POA: Diagnosis not present

## 2022-06-13 DIAGNOSIS — I10 Essential (primary) hypertension: Secondary | ICD-10-CM | POA: Diagnosis not present

## 2022-06-13 DIAGNOSIS — M3320 Polymyositis, organ involvement unspecified: Secondary | ICD-10-CM

## 2022-06-13 LAB — POCT GLYCOSYLATED HEMOGLOBIN (HGB A1C): Hemoglobin A1C: 6.8 % — AB (ref 4.0–5.6)

## 2022-06-13 MED ORDER — METOPROLOL SUCCINATE ER 25 MG PO TB24: 25.0000 mg | ORAL_TABLET | Freq: Every day | ORAL | 3 refills | Status: DC

## 2022-06-13 NOTE — Assessment & Plan Note (Signed)
Doing well with the IVIG infusions and prednisone 2.'5mg'$  daily (and weekly MTX injections)

## 2022-06-13 NOTE — Progress Notes (Signed)
Subjective:    Patient ID: Bridget Romero, female    DOB: [birth year], 76 y.o.   MRN: [id number]  HPI Here for follow up of diabetes  Doing well Still getting IVIG infusions every 8 weeks On prednisone 2.5 mg per day Polymyositis controlled  Checks sugars bid Mornings all under 120---before supper 140-160 No hypoglycemic reactions Back to 10 units of insulin Some foot tingling--most at night. No burning or sig pain  Current Outpatient Medications on File Prior to Visit  Medication Sig Dispense Refill   Accu-Chek FastClix Lancets MISC USE TO TEST BLOOD SUGAR TWICE DAILY DX E11.40 102 each 11   ACCU-CHEK SMARTVIEW test strip USE AS DIRECTED TO CHECK BLOOD SUGAR TWICE DAILY 100 each 6   acetaminophen (TYLENOL) 500 MG tablet Take 500 mg by mouth every 6 (six) hours as needed.     Alcohol Swabs (B-D SINGLE USE SWABS REGULAR) PADS Use to test blood sugar once daily dx: 250.00 100 each 3   Blood Glucose Calibration (ACCU-CHEK AVIVA) SOLN Use as directed 1 each 3   Cholecalciferol (VITAMIN D) 1000 UNITS capsule Take 1,000 Units by mouth daily.     folic acid (FOLVITE) 1 MG tablet Take 1 mg by mouth daily.     LANTUS 100 UNIT/ML injection INJECT 10 TO 20 UNITS INTO THE SKIN DAILY AS DIRECTED 10 mL 3   lisinopril (ZESTRIL) 10 MG tablet TAKE 1 TABLET BY MOUTH ONCE A DAY 90 tablet 1   magnesium citrate SOLN Take 148 mLs by mouth once a week.     metFORMIN (GLUCOPHAGE) 1000 MG tablet TAKE 1 TABLET BY MOUTH TWICE A DAY 180 tablet 3   methotrexate 250 MG/10ML injection Inject into the skin.      Multiple Vitamin (MULTIVITAMIN) tablet Take 1 tablet by mouth daily.     predniSONE (DELTASONE) 2.5 MG tablet Take 2.5 mg by mouth daily.     trospium (SANCTURA) 20 MG tablet Take 1 tab 1 hour prior to bedtime 30 tablet 11   gabapentin (NEURONTIN) 300 MG capsule Take 1 capsule by mouth 3 (three) times daily.     Current Facility-Administered Medications on File Prior to Visit  Medication Dose Route  Frequency Provider Last Rate Last Admin   dextrose 5 % solution   Intravenous Continuous Cammie Sickle, MD   Stopped at 08/30/21 1350    Allergies  Allergen Reactions   Atorvastatin Other (See Comments)    myalgias   Pravastatin Other (See Comments)    Muscle pain    Past Medical History:  Diagnosis Date   Allergy    Asthma    Diabetes mellitus    Hyperlipidemia    Hypertension    Neuromuscular disorder ([place])    polymyositis   Osteoarthritis    Vitamin B12 deficiency     Past Surgical History:  Procedure Laterality Date   COLONOSCOPY WITH PROPOFOL N/A 03/07/2016   Procedure: COLONOSCOPY WITH PROPOFOL;  Surgeon: Lollie Sails, MD;  Location: [facility] ENDOSCOPY;  Service: Endoscopy;  Laterality: N/A;    Family History  Problem Relation Age of Onset   Heart disease Mother    Cancer Father    Breast cancer Neg Hx     Social History   Socioeconomic History   Marital status: Widowed    Spouse name: Not on file   Number of children: 2   Years of education: Not on file   Highest education level: Not on file  Occupational History   Occupation:  Retired as Training and development officer at [employer]    Comment:    Tobacco Use   Smoking status: Former   Smokeless tobacco: Never   Tobacco comments:    age 61 when stopped smoking  Vaping Use   Vaping Use: Never used  Substance and Sexual Activity   Alcohol use: No   Drug use: No   Sexual activity: Not on file  Other Topics Concern   Not on file  Social History Narrative   has living will   Son and daughter should make health care decisions for her   Would accept resuscitation   Not sure about tube feeds   Social Determinants of Health   Financial Resource Strain: Not on file  Food Insecurity: Not on file  Transportation Needs: Not on file  Physical Activity: Not on file  Stress: Not on file  Social Connections: Not on file  Intimate Partner Violence: Not on file   Review of Systems Sleeps  well Appetite is fair---weight stable No chest pain or SOB     Objective:   Physical Exam Constitutional:      Appearance: Normal appearance.  Cardiovascular:     Rate and Rhythm: Normal rate and regular rhythm.     Pulses: Normal pulses.     Heart sounds: No murmur heard.    No gallop.  Pulmonary:     Effort: Pulmonary effort is normal.     Breath sounds: Normal breath sounds. No wheezing or rales.  Musculoskeletal:     Cervical back: Neck supple.     Right lower leg: No edema.     Left lower leg: No edema.  Lymphadenopathy:     Cervical: No cervical adenopathy.  Skin:    Comments: No foot lesions  Neurological:     Mental Status: She is alert.            Assessment & Plan:

## 2022-06-13 NOTE — Assessment & Plan Note (Signed)
BP Readings from Last 3 Encounters:  06/13/22 132/78  01/06/22 (!) 173/86  01/05/22 (!) 144/74   Is on the lisinopril 10 daily Put on metoprolol 25 by Dr Dory [place] better now so will continue

## 2022-06-13 NOTE — Assessment & Plan Note (Signed)
Lab Results  Component Value Date   HGBA1C 6.8 (A) 06/13/2022   Good control still  On insulin 10 units daily, metformin 1000 bid  Uses the gabapentin 300 tid

## 2022-06-22 DIAGNOSIS — D649 Anemia, unspecified: Secondary | ICD-10-CM | POA: Diagnosis not present

## 2022-06-22 DIAGNOSIS — M3322 Polymyositis with myopathy: Secondary | ICD-10-CM | POA: Diagnosis not present

## 2022-06-22 DIAGNOSIS — Z79899 Other long term (current) drug therapy: Secondary | ICD-10-CM | POA: Diagnosis not present

## 2022-06-29 ENCOUNTER — Telehealth: Payer: Self-pay | Admitting: Internal Medicine

## 2022-06-29 ENCOUNTER — Other Ambulatory Visit: Payer: Self-pay

## 2022-06-29 DIAGNOSIS — D649 Anemia, unspecified: Secondary | ICD-10-CM

## 2022-06-29 DIAGNOSIS — M3320 Polymyositis, organ involvement unspecified: Secondary | ICD-10-CM

## 2022-06-29 NOTE — Telephone Encounter (Signed)
Patient called and wanted to know how many days can she use the insulin before she throws it away. Call back [phone]

## 2022-06-29 NOTE — Telephone Encounter (Signed)
Spoke to pt. According to several sources online, they recommend discarding after 28 days.

## 2022-06-30 ENCOUNTER — Inpatient Hospital Stay: Payer: Medicare PPO | Attending: Internal Medicine | Admitting: Internal Medicine

## 2022-06-30 ENCOUNTER — Encounter: Payer: Self-pay | Admitting: Internal Medicine

## 2022-06-30 ENCOUNTER — Inpatient Hospital Stay: Payer: Medicare PPO

## 2022-06-30 DIAGNOSIS — Z7952 Long term (current) use of systemic steroids: Secondary | ICD-10-CM | POA: Insufficient documentation

## 2022-06-30 DIAGNOSIS — Z79899 Other long term (current) drug therapy: Secondary | ICD-10-CM | POA: Diagnosis not present

## 2022-06-30 DIAGNOSIS — D649 Anemia, unspecified: Secondary | ICD-10-CM | POA: Diagnosis not present

## 2022-06-30 DIAGNOSIS — M3320 Polymyositis, organ involvement unspecified: Secondary | ICD-10-CM | POA: Diagnosis not present

## 2022-06-30 DIAGNOSIS — Z87891 Personal history of nicotine dependence: Secondary | ICD-10-CM | POA: Insufficient documentation

## 2022-06-30 DIAGNOSIS — Z809 Family history of malignant neoplasm, unspecified: Secondary | ICD-10-CM | POA: Diagnosis not present

## 2022-06-30 LAB — CBC WITH DIFFERENTIAL/PLATELET
Abs Immature Granulocytes: 0.04 10*3/uL (ref 0.00–0.07)
Basophils Absolute: 0 10*3/uL (ref 0.0–0.1)
Basophils Relative: 1 %
Eosinophils Absolute: 0.1 10*3/uL (ref 0.0–0.5)
Eosinophils Relative: 1 %
HCT: 29.5 % — ABNORMAL LOW (ref 36.0–46.0)
Hemoglobin: 9.5 g/dL — ABNORMAL LOW (ref 12.0–15.0)
Immature Granulocytes: 1 %
Lymphocytes Relative: 20 %
Lymphs Abs: 1.4 10*3/uL (ref 0.7–4.0)
MCH: 29.9 pg (ref 26.0–34.0)
MCHC: 32.2 g/dL (ref 30.0–36.0)
MCV: 92.8 fL (ref 80.0–100.0)
Monocytes Absolute: 0.3 10*3/uL (ref 0.1–1.0)
Monocytes Relative: 4 %
Neutro Abs: 5.2 10*3/uL (ref 1.7–7.7)
Neutrophils Relative %: 73 %
Platelets: 278 10*3/uL (ref 150–400)
RBC: 3.18 MIL/uL — ABNORMAL LOW (ref 3.87–5.11)
RDW: 14.9 % (ref 11.5–15.5)
WBC: 6.9 10*3/uL (ref 4.0–10.5)
nRBC: 0 % (ref 0.0–0.2)

## 2022-06-30 LAB — COMPREHENSIVE METABOLIC PANEL
ALT: 25 U/L (ref 0–44)
AST: 32 U/L (ref 15–41)
Albumin: 3.4 g/dL — ABNORMAL LOW (ref 3.5–5.0)
Alkaline Phosphatase: 46 U/L (ref 38–126)
Anion gap: 6 (ref 5–15)
BUN: 18 mg/dL (ref 8–23)
CO2: 25 mmol/L (ref 22–32)
Calcium: 8.8 mg/dL — ABNORMAL LOW (ref 8.9–10.3)
Chloride: 105 mmol/L (ref 98–111)
Creatinine, Ser: 0.81 mg/dL (ref 0.44–1.00)
GFR, Estimated: >60 mL/min (ref >60)
Glucose, Bld: 120 mg/dL — ABNORMAL HIGH (ref 70–99)
Potassium: 4 mmol/L (ref 3.5–5.1)
Sodium: 136 mmol/L (ref 135–145)
Total Bilirubin: 0.6 mg/dL (ref 0.3–1.2)
Total Protein: 7 g/dL (ref 6.5–8.1)

## 2022-06-30 LAB — IRON AND TIBC
Iron: 67 ug/dL (ref 28–170)
Saturation Ratios: 24 % (ref 10.4–31.8)
TIBC: 274 ug/dL (ref 250–450)
UIBC: 207 ug/dL

## 2022-06-30 LAB — LACTATE DEHYDROGENASE: LDH: 170 U/L (ref 98–192)

## 2022-06-30 LAB — FERRITIN: Ferritin: 92 ng/mL (ref 11–307)

## 2022-06-30 NOTE — Progress Notes (Signed)
[place] CONSULT NOTE  Patient Care Team: [name], MD as PCP - General (Internal Medicine) Emmaline Kluver., MD (Rheumatology)  CHIEF COMPLAINTS/PURPOSE OF CONSULTATION: Polymyositis/; Anemia  #Polymyositis [Dr. [name]; 2019; left arm biopsy]; low dose prednisone/methotrexate/IVIG infusions 400 mg kilogram daily x4 days every 6 weeks [facility]; Dr.Patel] Oncology History   No history exists.     HISTORY OF PRESENTING ILLNESS: Alone.  Walks independently.  Bridget Romero 76 y.o.  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   Patient has had longstanding anemia for the last 3 to 4 years however progressively getting worse.   She denies any blood in stools or black-colored stools.  Denies any blood in urine.  Patient denies any worsening weakness in arms or legs.  No falls.  No headaches.  No nausea no vomiting no fevers or chills.   Review of Systems  Constitutional:  Positive for malaise/fatigue. Negative for chills, diaphoresis, fever and weight loss.  HENT:  Negative for nosebleeds and sore throat.   Eyes:  Negative for double vision.  Respiratory:  Negative for cough, hemoptysis, sputum production, shortness of breath and wheezing.   Cardiovascular:  Negative for chest pain, palpitations, orthopnea and leg swelling.  Gastrointestinal:  Negative for abdominal pain, blood in stool, constipation, diarrhea, heartburn, melena, nausea and vomiting.  Genitourinary:  Negative for dysuria, frequency and urgency.  Musculoskeletal:  Negative for back pain and joint pain.  Skin: Negative.  Negative for itching and rash.  Neurological:  Negative for dizziness, tingling, focal weakness, weakness and headaches.  Endo/Heme/Allergies:  Does not bruise/bleed easily.  Psychiatric/Behavioral:  Negative for depression. The patient is not nervous/anxious and does not have insomnia.      MEDICAL HISTORY:  Past  Medical History:  Diagnosis Date   Allergy    Asthma    Diabetes mellitus    Hyperlipidemia    Hypertension    Neuromuscular disorder ([place])    polymyositis   Osteoarthritis    Vitamin B12 deficiency     SURGICAL HISTORY: Past Surgical History:  Procedure Laterality Date   COLONOSCOPY WITH PROPOFOL N/A 03/07/2016   Procedure: COLONOSCOPY WITH PROPOFOL;  Surgeon: Lollie Sails, MD;  Location: [facility] ENDOSCOPY;  Service: Endoscopy;  Laterality: N/A;    SOCIAL HISTORY: Social History   Socioeconomic History   Marital status: Widowed    Spouse name: Not on file   Number of children: 2   Years of education: Not on file   Highest education level: Not on file  Occupational History   Occupation: Retired as Training and development officer at [employer]:    Tobacco Use   Smoking status: Former   Smokeless tobacco: Never   Tobacco comments:    age 80 when stopped smoking  Vaping Use   Vaping Use: Never used  Substance and Sexual Activity   Alcohol use: No   Drug use: No   Sexual activity: Not on file  Other Topics Concern   Not on file  Social History Narrative   has living will   Son and daughter should make health care decisions for her   Would accept resuscitation   Not sure about tube feeds   Social Determinants of Health   Financial Resource Strain: Not on file  Food Insecurity: Not on file  Transportation Needs: Not on file  Physical Activity: Not on file  Stress: Not on file  Social  Connections: Not on file  Intimate Partner Violence: Not on file    FAMILY HISTORY: Family History  Problem Relation Age of Onset   Heart disease Mother    Cancer Father    Breast cancer Neg Hx     ALLERGIES:  is allergic to atorvastatin and pravastatin.  MEDICATIONS:  Current Outpatient Medications  Medication Sig Dispense Refill   Accu-Chek FastClix Lancets MISC USE TO TEST BLOOD SUGAR TWICE DAILY DX E11.40 102 each 11   ACCU-CHEK SMARTVIEW test strip USE AS  DIRECTED TO CHECK BLOOD SUGAR TWICE DAILY 100 each 6   acetaminophen (TYLENOL) 500 MG tablet Take 500 mg by mouth every 6 (six) hours as needed.     Alcohol Swabs (B-D SINGLE USE SWABS REGULAR) PADS Use to test blood sugar once daily dx: 250.00 100 each 3   Blood Glucose Calibration (ACCU-CHEK AVIVA) SOLN Use as directed 1 each 3   Cholecalciferol (VITAMIN D) 1000 UNITS capsule Take 1,000 Units by mouth daily.     ferrous sulfate 325 (65 FE) MG tablet Take 325 mg by mouth daily with breakfast.     folic acid (FOLVITE) 1 MG tablet Take 1 mg by mouth daily.     gabapentin (NEURONTIN) 300 MG capsule Take 1 capsule by mouth 3 (three) times daily.     LANTUS 100 UNIT/ML injection INJECT 10 TO 20 UNITS INTO THE SKIN DAILY AS DIRECTED 10 mL 3   lisinopril (ZESTRIL) 10 MG tablet TAKE 1 TABLET BY MOUTH ONCE A DAY 90 tablet 1   magnesium citrate SOLN Take 148 mLs by mouth once a week.     metFORMIN (GLUCOPHAGE) 1000 MG tablet TAKE 1 TABLET BY MOUTH TWICE A DAY (Patient taking differently: 500 mg 2 (two) times daily with a meal.) 180 tablet 3   methotrexate 250 MG/10ML injection Inject into the skin.      metoprolol succinate (TOPROL-XL) 25 MG 24 hr tablet Take 1 tablet (25 mg total) by mouth daily. 90 tablet 3   Multiple Vitamin (MULTIVITAMIN) tablet Take 1 tablet by mouth daily.     predniSONE (DELTASONE) 2.5 MG tablet Take 2.5 mg by mouth daily.     trospium (SANCTURA) 20 MG tablet Take 1 tab 1 hour prior to bedtime 30 tablet 11   No current facility-administered medications for this visit.   Facility-Administered Medications Ordered in Other Visits  Medication Dose Route Frequency Provider Last Rate Last Admin   dextrose 5 % solution   Intravenous Continuous Cammie Sickle, MD   Stopped at 08/30/21 1350      .  PHYSICAL EXAMINATION: ECOG PERFORMANCE STATUS: 0 - Asymptomatic  Vitals:   06/30/22 1322  BP: (!) 161/85  Pulse: 67  Temp: 98.1 F (36.7 C)  SpO2: 100%   Filed Weights    06/30/22 1322  Weight: 199 lb 9.6 oz (90.5 kg)    Physical Exam Vitals and nursing note reviewed.  Constitutional:      Comments:    HENT:     Head: Normocephalic and atraumatic.     Mouth/Throat:     Pharynx: Oropharynx is clear.  Eyes:     Extraocular Movements: Extraocular movements intact.     Pupils: Pupils are equal, round, and reactive to light.  Cardiovascular:     Rate and Rhythm: Normal rate and regular rhythm.  Pulmonary:     Comments: Decreased breath sounds bilaterally.  Abdominal:     Palpations: Abdomen is soft.  Musculoskeletal:  General: Normal range of motion.     Cervical back: Normal range of motion.  Skin:    General: Skin is warm.  Neurological:     General: No focal deficit present.     Mental Status: She is alert and oriented to person, place, and time.  Psychiatric:        Behavior: Behavior normal.        Judgment: Judgment normal.     LABORATORY DATA:  I have reviewed the data as listed Lab Results  Component Value Date   WBC 6.9 06/30/2022   HGB 9.5 (L) 06/30/2022   HCT 29.5 (L) 06/30/2022   MCV 92.8 06/30/2022   PLT 278 06/30/2022   Recent Labs    02/14/22 0935 06/06/22 1016 06/30/22 1324  NA 141 139 136  K 4.1 4.2 4.0  CL 107 104 105  CO2 '25 26 25  '$ GLUCOSE 122* 129* 120*  BUN '21 22 18  '$ CREATININE 0.85 0.78 0.81  CALCIUM 9.0 9.2 8.8*  GFRNONAA >60 >60 >60  PROT 7.0 7.0 7.0  ALBUMIN 3.5 3.7 3.4*  AST 18 28 32  ALT '12 23 25  '$ ALKPHOS 51 52 46  BILITOT 0.7 0.8 0.6    RADIOGRAPHIC STUDIES: I have personally reviewed the radiological images as listed and agreed with the findings in the report. MM 3D SCREEN BREAST BILATERAL  Result Date: 06/13/2022 CLINICAL DATA:  Screening. EXAM: DIGITAL SCREENING BILATERAL MAMMOGRAM WITH TOMOSYNTHESIS AND CAD TECHNIQUE: Bilateral screening digital craniocaudal and mediolateral oblique mammograms were obtained. Bilateral screening digital breast tomosynthesis was performed. The  images were evaluated with computer-aided detection. COMPARISON:  Previous exam(s). ACR Breast Density Category b: There are scattered areas of fibroglandular density. FINDINGS: There are no findings suspicious for malignancy. IMPRESSION: No mammographic evidence of malignancy. A result letter of this screening mammogram will be mailed directly to the patient. RECOMMENDATION: Screening mammogram in one year. (Code:SM-B-01Y) BI-RADS CATEGORY  1: Negative. Electronically Signed   By: Dorise Bullion III M.D.   On: 06/13/2022 14:33    ASSESSMENT & PLAN:   Symptomatic anemia # Normocytic anemia: chronic Hb ~9-10. JUNE 2023-- iron sat -23%; continue PO iron. #Recommend gentle iron 1 pill a day; discussed the lack of GI side effects/diarrhea.    #Etiology of anemia: Unclear.?  Related to polymyositis.  If not improved consider further work-up including myeloma panel; GI work-up etc.  # Polymyositis [Dr. [name]; Clinically, she is doing well on IVIG 2 g/kg every 6 weeks [Short stay]; also prednisone 2.5 mg a day and MTX 10 mg SQ weekly.; JUNE 2023- 381-defer to Dr.Patel; Rheum  # Disposition:  # follow up in 4 months- MD; cbc/bmp; CRP; LDH; possible venofer- Dr.B.  Cc; Dr.Kernodle.   All questions were answered. The patient knows to call the clinic with any problems, questions or concerns.    Cammie Sickle, MD 06/30/2022 5:04 PM

## 2022-06-30 NOTE — Patient Instructions (Signed)
#  Recommend gentle iron 1 pill a day; should not upset your stomach or cause constipation.  Talk to the pharmacist if you can find it/it is over-the-counter.  

## 2022-06-30 NOTE — Progress Notes (Signed)
She would like to know if you can give her something to take prn to help her sleep?

## 2022-06-30 NOTE — Assessment & Plan Note (Addendum)
#   Normocytic anemia: chronic Hb ~9-10. JUNE 2023-- iron sat -23%; continue PO iron. #Recommend gentle iron 1 pill a day; discussed the lack of GI side effects/diarrhea.    #Etiology of anemia: Unclear.?  Related to polymyositis.  If not improved consider further work-up including myeloma panel; GI work-up etc.  # Polymyositis [Dr. [name]; Clinically, she is doing well on IVIG 2 g/kg every 6 weeks [Short stay]; also prednisone 2.5 mg a day and MTX'10mg'$ SQweekly.; JUNE 2023- 381-defer to Dr.Patel; Rheum  # Disposition:  # follow up in 4 months- MD; cbc/bmp; CRP; LDH; possible venofer- Dr.B.  Cc; Dr.Kernodle.

## 2022-07-01 LAB — HAPTOGLOBIN: Haptoglobin: 146 mg/dL (ref 42–346)

## 2022-07-24 ENCOUNTER — Telehealth: Payer: Self-pay | Admitting: Internal Medicine

## 2022-07-24 NOTE — Telephone Encounter (Signed)
Spok[place] pt. She will try these things and if no improvement, will make an OV.

## 2022-07-24 NOTE — Telephone Encounter (Signed)
Patient called and thinks she pulled a muscle in bottom of her back and she was just wanting some advice on what she can take over the counter. She said if it doesn't get better than she will schedule an appt. Call back is [phone]

## 2022-07-31 DIAGNOSIS — E119 Type 2 diabetes mellitus without complications: Secondary | ICD-10-CM | POA: Diagnosis not present

## 2022-07-31 LAB — HM DIABETES EYE EXAM

## 2022-08-01 ENCOUNTER — Ambulatory Visit
Admission: RE | Admit: 2022-08-01 | Discharge: 2022-08-01 | Disposition: A | Discharge status: Home / Self Care | Payer: Medicare PPO | Source: Ambulatory Visit | Attending: Rheumatology | Admitting: Rheumatology

## 2022-08-01 VITALS — BP 155/67 | HR 62 | Temp 97.2°F | Resp 17 | Ht 66.5 in | Wt 196.0 lb

## 2022-08-01 DIAGNOSIS — M3329 Polymyositis with other organ involvement: Secondary | ICD-10-CM | POA: Diagnosis not present

## 2022-08-01 DIAGNOSIS — M3320 Polymyositis, organ involvement unspecified: Secondary | ICD-10-CM | POA: Insufficient documentation

## 2022-08-01 LAB — COMPREHENSIVE METABOLIC PANEL
ALT: 20 U/L (ref 0–44)
AST: 31 U/L (ref 15–41)
Albumin: 3.7 g/dL (ref 3.5–5.0)
Alkaline Phosphatase: 64 U/L (ref 38–126)
Anion gap: 7 (ref 5–15)
BUN: 24 mg/dL — ABNORMAL HIGH (ref 8–23)
CO2: 23 mmol/L (ref 22–32)
Calcium: 9.4 mg/dL (ref 8.9–10.3)
Chloride: 111 mmol/L (ref 98–111)
Creatinine, Ser: 0.78 mg/dL (ref 0.44–1.00)
GFR, Estimated: >60 mL/min (ref >60)
Glucose, Bld: 166 mg/dL — ABNORMAL HIGH (ref 70–99)
Potassium: 3.8 mmol/L (ref 3.5–5.1)
Sodium: 141 mmol/L (ref 135–145)
Total Bilirubin: 0.7 mg/dL (ref 0.3–1.2)
Total Protein: 7.1 g/dL (ref 6.5–8.1)

## 2022-08-01 LAB — CBC
HCT: 31 % — ABNORMAL LOW (ref 36.0–46.0)
Hemoglobin: 9.6 g/dL — ABNORMAL LOW (ref 12.0–15.0)
MCH: 28.5 pg (ref 26.0–34.0)
MCHC: 31 g/dL (ref 30.0–36.0)
MCV: 92 fL (ref 80.0–100.0)
Platelets: 299 10*3/uL (ref 150–400)
RBC: 3.37 MIL/uL — ABNORMAL LOW (ref 3.87–5.11)
RDW: 14.9 % (ref 11.5–15.5)
WBC: 6.1 10*3/uL (ref 4.0–10.5)
nRBC: 0 % (ref 0.0–0.2)

## 2022-08-01 LAB — CK: Total CK: 381 U/L — ABNORMAL HIGH (ref 38–234)

## 2022-08-01 MED ORDER — ACETAMINOPHEN 325 MG PO TABS
650.0000 mg | ORAL_TABLET | Freq: Once | ORAL | Status: AC
  Administered 2022-08-01: 650 mg via ORAL

## 2022-08-01 MED ORDER — DIPHENHYDRAMINE HCL 25 MG PO CAPS
ORAL_CAPSULE | ORAL | Status: AC
  Filled 2022-08-01: qty 1

## 2022-08-01 MED ORDER — DIPHENHYDRAMINE HCL 25 MG PO CAPS
25.0000 mg | ORAL_CAPSULE | Freq: Once | ORAL | Status: AC
  Administered 2022-08-01: 25 mg via ORAL

## 2022-08-01 MED ORDER — IMMUNE GLOBULIN (HUMAN) 10 GM/100ML IV SOLN
400.0000 mg/kg | INTRAVENOUS | Status: DC
  Administered 2022-08-01: 30 g via INTRAVENOUS
  Filled 2022-08-01 (×2): qty 300

## 2022-08-01 MED ORDER — ACETAMINOPHEN 325 MG PO TABS
ORAL_TABLET | ORAL | Status: AC
  Filled 2022-08-01: qty 2

## 2022-08-01 MED ORDER — METHYLPREDNISOLONE SODIUM SUCC 40 MG IJ SOLR
10.0000 mg | Freq: Once | INTRAMUSCULAR | Status: AC
  Administered 2022-08-01: 10 mg via INTRAVENOUS
  Filled 2022-08-01: qty 1

## 2022-08-02 ENCOUNTER — Ambulatory Visit
Admission: RE | Admit: 2022-08-02 | Discharge: 2022-08-02 | Disposition: A | Discharge status: Home / Self Care | Payer: Medicare PPO | Source: Ambulatory Visit | Attending: Rheumatology | Admitting: Rheumatology

## 2022-08-02 DIAGNOSIS — M3320 Polymyositis, organ involvement unspecified: Secondary | ICD-10-CM | POA: Insufficient documentation

## 2022-08-02 DIAGNOSIS — D649 Anemia, unspecified: Secondary | ICD-10-CM | POA: Insufficient documentation

## 2022-08-02 LAB — HAPTOGLOBIN: Haptoglobin: 151 mg/dL (ref 42–346)

## 2022-08-02 LAB — ALDOLASE: Aldolase: 5.6 U/L (ref 3.3–10.3)

## 2022-08-02 MED ORDER — ACETAMINOPHEN 325 MG PO TABS
ORAL_TABLET | ORAL | Status: AC
  Filled 2022-08-02: qty 2

## 2022-08-02 MED ORDER — DIPHENHYDRAMINE HCL 25 MG PO CAPS
25.0000 mg | ORAL_CAPSULE | Freq: Once | ORAL | Status: AC
  Administered 2022-08-02: 25 mg via ORAL

## 2022-08-02 MED ORDER — IMMUNE GLOBULIN (HUMAN) 10 GM/100ML IV SOLN
400.0000 mg/kg | Freq: Once | INTRAVENOUS | Status: AC
  Administered 2022-08-02: 30 g via INTRAVENOUS
  Filled 2022-08-02 (×2): qty 300

## 2022-08-02 MED ORDER — METHYLPREDNISOLONE SODIUM SUCC 40 MG IJ SOLR
10.0000 mg | Freq: Once | INTRAMUSCULAR | Status: AC
  Administered 2022-08-02: 10 mg via INTRAVENOUS
  Filled 2022-08-02: qty 1

## 2022-08-02 MED ORDER — DIPHENHYDRAMINE HCL 25 MG PO CAPS
ORAL_CAPSULE | ORAL | Status: AC
  Filled 2022-08-02: qty 1

## 2022-08-02 MED ORDER — ACETAMINOPHEN 325 MG PO TABS
650.0000 mg | ORAL_TABLET | Freq: Once | ORAL | Status: AC
  Administered 2022-08-02: 650 mg via ORAL

## 2022-08-03 ENCOUNTER — Ambulatory Visit
Admission: RE | Admit: 2022-08-03 | Discharge: 2022-08-03 | Disposition: A | Discharge status: Home / Self Care | Payer: Medicare PPO | Source: Ambulatory Visit | Attending: Rheumatology | Admitting: Rheumatology

## 2022-08-03 DIAGNOSIS — M3320 Polymyositis, organ involvement unspecified: Secondary | ICD-10-CM | POA: Insufficient documentation

## 2022-08-03 MED ORDER — DIPHENHYDRAMINE HCL 25 MG PO CAPS
25.0000 mg | ORAL_CAPSULE | Freq: Once | ORAL | Status: AC
  Administered 2022-08-03: 25 mg via ORAL

## 2022-08-03 MED ORDER — ACETAMINOPHEN 325 MG PO TABS
650.0000 mg | ORAL_TABLET | Freq: Once | ORAL | Status: AC
  Administered 2022-08-03: 650 mg via ORAL

## 2022-08-03 MED ORDER — DIPHENHYDRAMINE HCL 25 MG PO CAPS
ORAL_CAPSULE | ORAL | Status: AC
  Filled 2022-08-03: qty 1

## 2022-08-03 MED ORDER — ACETAMINOPHEN 325 MG PO TABS
ORAL_TABLET | ORAL | Status: AC
  Filled 2022-08-03: qty 2

## 2022-08-03 MED ORDER — IMMUNE GLOBULIN (HUMAN) 10 GM/100ML IV SOLN
400.0000 mg/kg | Freq: Once | INTRAVENOUS | Status: AC
  Administered 2022-08-03: 30 g via INTRAVENOUS
  Filled 2022-08-03: qty 300

## 2022-08-03 MED ORDER — METHYLPREDNISOLONE SODIUM SUCC 40 MG IJ SOLR
10.0000 mg | Freq: Once | INTRAMUSCULAR | Status: AC
  Administered 2022-08-03: 10 mg via INTRAVENOUS
  Filled 2022-08-03: qty 1

## 2022-08-04 ENCOUNTER — Ambulatory Visit
Admission: RE | Admit: 2022-08-04 | Discharge: 2022-08-04 | Disposition: A | Discharge status: Home / Self Care | Payer: Medicare PPO | Source: Ambulatory Visit | Attending: Rheumatology | Admitting: Rheumatology

## 2022-08-04 DIAGNOSIS — M3320 Polymyositis, organ involvement unspecified: Secondary | ICD-10-CM | POA: Diagnosis not present

## 2022-08-04 MED ORDER — METHYLPREDNISOLONE SODIUM SUCC 125 MG IJ SOLR
40.0000 mg | Freq: Once | INTRAMUSCULAR | Status: DC
Start: 2022-09-26 — End: 2022-08-05

## 2022-08-04 MED ORDER — ACETAMINOPHEN 325 MG PO TABS
ORAL_TABLET | ORAL | Status: AC
  Filled 2022-08-04: qty 2

## 2022-08-04 MED ORDER — METHYLPREDNISOLONE SODIUM SUCC 40 MG IJ SOLR
10.0000 mg | Freq: Once | INTRAMUSCULAR | Status: AC
  Administered 2022-08-04: 10 mg via INTRAVENOUS
  Filled 2022-08-04: qty 1

## 2022-08-04 MED ORDER — DIPHENHYDRAMINE HCL 25 MG PO CAPS
25.0000 mg | ORAL_CAPSULE | Freq: Once | ORAL | Status: AC
  Administered 2022-08-04: 25 mg via ORAL

## 2022-08-04 MED ORDER — ACETAMINOPHEN 325 MG PO TABS
650.0000 mg | ORAL_TABLET | Freq: Once | ORAL | Status: AC
  Administered 2022-08-04: 650 mg via ORAL

## 2022-08-04 MED ORDER — IMMUNE GLOBULIN (HUMAN) 10 GM/100ML IV SOLN
400.0000 mg/kg | Freq: Once | INTRAVENOUS | Status: AC
  Administered 2022-08-04: 30 g via INTRAVENOUS
  Filled 2022-08-04: qty 300

## 2022-08-04 MED ORDER — DIPHENHYDRAMINE HCL 25 MG PO CAPS
ORAL_CAPSULE | ORAL | Status: AC
  Filled 2022-08-04: qty 1

## 2022-08-04 MED ORDER — ACETAMINOPHEN 325 MG PO TABS
650.0000 mg | ORAL_TABLET | Freq: Once | ORAL | Status: DC
Start: 2022-09-26 — End: 2022-08-05

## 2022-08-10 DIAGNOSIS — N3281 Overactive bladder: Secondary | ICD-10-CM | POA: Diagnosis not present

## 2022-08-10 DIAGNOSIS — Z7952 Long term (current) use of systemic steroids: Secondary | ICD-10-CM | POA: Diagnosis not present

## 2022-08-10 DIAGNOSIS — Z794 Long term (current) use of insulin: Secondary | ICD-10-CM | POA: Diagnosis not present

## 2022-08-10 DIAGNOSIS — Z6831 Body mass index (BMI) 31.0-31.9, adult: Secondary | ICD-10-CM | POA: Diagnosis not present

## 2022-08-10 DIAGNOSIS — Z604 Social exclusion and rejection: Secondary | ICD-10-CM | POA: Diagnosis not present

## 2022-08-10 DIAGNOSIS — E1142 Type 2 diabetes mellitus with diabetic polyneuropathy: Secondary | ICD-10-CM | POA: Diagnosis not present

## 2022-08-10 DIAGNOSIS — M3320 Polymyositis, organ involvement unspecified: Secondary | ICD-10-CM | POA: Diagnosis not present

## 2022-08-10 DIAGNOSIS — I10 Essential (primary) hypertension: Secondary | ICD-10-CM | POA: Diagnosis not present

## 2022-08-10 DIAGNOSIS — E669 Obesity, unspecified: Secondary | ICD-10-CM | POA: Diagnosis not present

## 2022-09-22 ENCOUNTER — Other Ambulatory Visit: Payer: Self-pay | Admitting: Internal Medicine

## 2022-09-22 DIAGNOSIS — M3322 Polymyositis with myopathy: Secondary | ICD-10-CM | POA: Diagnosis not present

## 2022-09-22 DIAGNOSIS — Z7960 Long term (current) use of unspecified immunomodulators and immunosuppressants: Secondary | ICD-10-CM | POA: Diagnosis not present

## 2022-09-22 DIAGNOSIS — D649 Anemia, unspecified: Secondary | ICD-10-CM | POA: Diagnosis not present

## 2022-09-22 DIAGNOSIS — I10 Essential (primary) hypertension: Secondary | ICD-10-CM

## 2022-09-26 ENCOUNTER — Ambulatory Visit
Admission: RE | Admit: 2022-09-26 | Discharge: 2022-09-26 | Disposition: A | Discharge status: Home / Self Care | Payer: Medicare PPO | Source: Ambulatory Visit | Attending: Rheumatology | Admitting: Rheumatology

## 2022-09-26 DIAGNOSIS — M3320 Polymyositis, organ involvement unspecified: Secondary | ICD-10-CM | POA: Diagnosis not present

## 2022-09-26 MED ORDER — DIPHENHYDRAMINE HCL 25 MG PO CAPS
ORAL_CAPSULE | ORAL | Status: AC
  Administered 2022-09-26: 25 mg via ORAL
  Filled 2022-09-26: qty 1

## 2022-09-26 MED ORDER — ACETAMINOPHEN 325 MG PO TABS
ORAL_TABLET | ORAL | Status: AC
  Administered 2022-09-26: 650 mg via ORAL
  Filled 2022-09-26: qty 2

## 2022-09-26 MED ORDER — IMMUNE GLOBULIN (HUMAN) 10 GM/100ML IV SOLN
400.0000 mg/kg | Freq: Once | INTRAVENOUS | Status: AC
  Administered 2022-09-26: 30 g via INTRAVENOUS
  Filled 2022-09-26: qty 300

## 2022-09-26 MED ORDER — SODIUM CHLORIDE FLUSH 0.9 % IV SOLN
INTRAVENOUS | Status: AC
  Filled 2022-09-26: qty 20

## 2022-09-26 MED ORDER — ACETAMINOPHEN 325 MG PO TABS: 650.0000 mg | ORAL_TABLET | Freq: Once | ORAL | Status: AC

## 2022-09-26 MED ORDER — DIPHENHYDRAMINE HCL 25 MG PO CAPS: 25.0000 mg | ORAL_CAPSULE | Freq: Once | ORAL | Status: AC

## 2022-09-26 MED ORDER — METHYLPREDNISOLONE SODIUM SUCC 40 MG IJ SOLR
10.0000 mg | Freq: Once | INTRAMUSCULAR | Status: AC
  Administered 2022-09-26: 10 mg via INTRAVENOUS
  Filled 2022-09-26: qty 0.25

## 2022-09-26 NOTE — Progress Notes (Signed)
Pt received the IvG infusion, tolerated well, VS WNL, no c/o pain nor distress noted. IV was flushed with 50 ml of 5 % destrose, iv discontinued without difficulties. Continue to monitor.

## 2022-09-27 ENCOUNTER — Ambulatory Visit
Admission: RE | Admit: 2022-09-27 | Discharge: 2022-09-27 | Disposition: A | Discharge status: Home / Self Care | Payer: Medicare PPO | Source: Ambulatory Visit | Attending: Rheumatology | Admitting: Rheumatology

## 2022-09-27 DIAGNOSIS — M3320 Polymyositis, organ involvement unspecified: Secondary | ICD-10-CM | POA: Insufficient documentation

## 2022-09-27 MED ORDER — DIPHENHYDRAMINE HCL 25 MG PO CAPS
25.0000 mg | ORAL_CAPSULE | Freq: Once | ORAL | Status: AC
  Administered 2022-09-27: 25 mg via ORAL

## 2022-09-27 MED ORDER — ACETAMINOPHEN 325 MG PO TABS: 650.0000 mg | ORAL_TABLET | Freq: Once | ORAL | Status: AC

## 2022-09-27 MED ORDER — IMMUNE GLOBULIN (HUMAN) 10 GM/100ML IV SOLN
400.0000 mg/kg | Freq: Once | INTRAVENOUS | Status: AC
  Administered 2022-09-27: 30 g via INTRAVENOUS
  Filled 2022-09-27: qty 300

## 2022-09-27 MED ORDER — ACETAMINOPHEN 325 MG PO TABS
ORAL_TABLET | ORAL | Status: AC
  Administered 2022-09-27: 650 mg via ORAL
  Filled 2022-09-27: qty 2

## 2022-09-27 MED ORDER — DIPHENHYDRAMINE HCL 25 MG PO CAPS
ORAL_CAPSULE | ORAL | Status: AC
  Filled 2022-09-27: qty 1

## 2022-09-27 MED ORDER — METHYLPREDNISOLONE SODIUM SUCC 40 MG IJ SOLR
10.0000 mg | Freq: Once | INTRAMUSCULAR | Status: AC
  Administered 2022-09-27: 10 mg via INTRAVENOUS
  Filled 2022-09-27: qty 0.25

## 2022-09-28 ENCOUNTER — Ambulatory Visit
Admission: RE | Admit: 2022-09-28 | Discharge: 2022-09-28 | Disposition: A | Discharge status: Home / Self Care | Payer: Medicare PPO | Source: Ambulatory Visit | Attending: Rheumatology | Admitting: Rheumatology

## 2022-09-28 DIAGNOSIS — M3320 Polymyositis, organ involvement unspecified: Secondary | ICD-10-CM | POA: Insufficient documentation

## 2022-09-28 MED ORDER — IMMUNE GLOBULIN (HUMAN) 10 GM/100ML IV SOLN
400.0000 mg/kg | Freq: Once | INTRAVENOUS | Status: DC
  Filled 2022-09-28: qty 350

## 2022-09-28 MED ORDER — ACETAMINOPHEN 325 MG PO TABS
ORAL_TABLET | ORAL | Status: AC
  Filled 2022-09-28: qty 2

## 2022-09-28 MED ORDER — DIPHENHYDRAMINE HCL 25 MG PO CAPS
25.0000 mg | ORAL_CAPSULE | Freq: Once | ORAL | Status: AC
  Administered 2022-09-28: 25 mg via ORAL

## 2022-09-28 MED ORDER — IMMUNE GLOBULIN (HUMAN) 10 GM/100ML IV SOLN
400.0000 mg/kg | Freq: Once | INTRAVENOUS | Status: AC
  Administered 2022-09-28: 30 g via INTRAVENOUS
  Filled 2022-09-28: qty 300

## 2022-09-28 MED ORDER — ACETAMINOPHEN 325 MG PO TABS
650.0000 mg | ORAL_TABLET | Freq: Once | ORAL | Status: AC
  Administered 2022-09-28: 650 mg via ORAL

## 2022-09-28 MED ORDER — DIPHENHYDRAMINE HCL 25 MG PO CAPS
ORAL_CAPSULE | ORAL | Status: AC
  Filled 2022-09-28: qty 1

## 2022-09-28 MED ORDER — DEXTROSE 5 % IV SOLN: INTRAVENOUS | Status: DC

## 2022-09-28 MED ORDER — METHYLPREDNISOLONE SODIUM SUCC 40 MG IJ SOLR
10.0000 mg | Freq: Once | INTRAMUSCULAR | Status: AC
  Administered 2022-09-28: 10 mg via INTRAVENOUS
  Filled 2022-09-28: qty 1

## 2022-09-28 MED ORDER — SODIUM CHLORIDE 0.9 % IV SOLN: INTRAVENOUS | Status: DC

## 2022-09-28 NOTE — Progress Notes (Signed)
Pt received the IVG infusion, tolerated well, VS WNL, no c/o pain nor distress noted. IV was flushed with 50 ml of 5 % destrose after infusion. IV discontinued without difficulties. Patient ambulated out.

## 2022-09-29 ENCOUNTER — Ambulatory Visit
Admission: RE | Admit: 2022-09-29 | Discharge: 2022-09-29 | Disposition: A | Discharge status: Home / Self Care | Payer: Medicare PPO | Source: Ambulatory Visit | Attending: Rheumatology | Admitting: Rheumatology

## 2022-09-29 DIAGNOSIS — M3320 Polymyositis, organ involvement unspecified: Secondary | ICD-10-CM | POA: Diagnosis not present

## 2022-09-29 MED ORDER — DIPHENHYDRAMINE HCL 25 MG PO CAPS: 25.0000 mg | ORAL_CAPSULE | Freq: Once | ORAL | Status: AC

## 2022-09-29 MED ORDER — ACETAMINOPHEN 325 MG PO TABS: 650.0000 mg | ORAL_TABLET | Freq: Once | ORAL | Status: AC

## 2022-09-29 MED ORDER — IMMUNE GLOBULIN (HUMAN) 10 GM/100ML IV SOLN
400.0000 mg/kg | Freq: Once | INTRAVENOUS | Status: AC
  Administered 2022-09-29: 30 g via INTRAVENOUS
  Filled 2022-09-29: qty 300

## 2022-09-29 MED ORDER — METHYLPREDNISOLONE SODIUM SUCC 40 MG IJ SOLR
10.0000 mg | Freq: Once | INTRAMUSCULAR | Status: AC
  Administered 2022-09-29: 10 mg via INTRAVENOUS
  Filled 2022-09-29: qty 1

## 2022-09-29 MED ORDER — DIPHENHYDRAMINE HCL 25 MG PO CAPS
ORAL_CAPSULE | ORAL | Status: AC
  Administered 2022-09-29: 25 mg via ORAL
  Filled 2022-09-29: qty 1

## 2022-09-29 MED ORDER — ACETAMINOPHEN 325 MG PO TABS
ORAL_TABLET | ORAL | Status: AC
  Administered 2022-09-29: 650 mg via ORAL
  Filled 2022-09-29: qty 2

## 2022-10-16 ENCOUNTER — Other Ambulatory Visit: Payer: Self-pay | Admitting: Internal Medicine

## 2022-10-24 ENCOUNTER — Other Ambulatory Visit: Payer: Self-pay | Admitting: Physician Assistant

## 2022-10-24 DIAGNOSIS — R351 Nocturia: Secondary | ICD-10-CM

## 2022-10-26 ENCOUNTER — Telehealth: Payer: Self-pay | Admitting: Physician Assistant

## 2022-10-26 NOTE — Telephone Encounter (Signed)
Pt has an appt on Tuesday 11/7 w/Sam and will run out of meds (trospium (SANCTURA) 20 MG tablet [[id number]] ) tomorrow.  She wants to know if she can get samples or if we would call enough in to last until her appt to [facility].

## 2022-10-27 ENCOUNTER — Other Ambulatory Visit: Payer: Self-pay

## 2022-10-27 ENCOUNTER — Encounter: Payer: Self-pay | Admitting: Internal Medicine

## 2022-10-27 ENCOUNTER — Ambulatory Visit (LOCAL_COMMUNITY_HEALTH_CENTER): Payer: Medicare PPO

## 2022-10-27 DIAGNOSIS — Z23 Encounter for immunization: Secondary | ICD-10-CM | POA: Diagnosis not present

## 2022-10-27 DIAGNOSIS — R351 Nocturia: Secondary | ICD-10-CM

## 2022-10-27 DIAGNOSIS — Z719 Counseling, unspecified: Secondary | ICD-10-CM

## 2022-10-27 MED ORDER — TROSPIUM CHLORIDE 20 MG PO TABS: ORAL_TABLET | ORAL | 0 refills | Status: DC

## 2022-10-27 NOTE — Telephone Encounter (Signed)
Patient called back today.  I advised her that we do not have trospium samples here in the clinic.  Patient has not been seen in the office for over a year.  She has a scheduled appointment on Tuesday, 11/7 with Sam.  Patient is requesting a few pills to get her through to her appointment on Tuesday to be sent to the [facility].  Please advise.

## 2022-10-27 NOTE — Progress Notes (Signed)
  Are you feeling sick today? No   Have you ever received a dose of COVID-19 Vaccine? [employer], [place], [place], [place], Other) Yes  If yes, which vaccine and how many doses?   5 doses Pfizer   Did you bring the vaccination record card or other documentation?  Yes   Do you have a health condition or are undergoing treatment that makes you moderately or severely immunocompromised? This would include, but not be limited to: cancer, HIV, organ transplant, immunosuppressive therapy/high-dose corticosteroids, or moderate/severe primary immunodeficiency.  No  Have you received COVID-19 vaccine before or during hematopoietic cell transplant (HCT) or CAR-T-cell therapies? No  Have you ever had an allergic reaction to: (This would include a severe allergic reaction or a reaction that caused hives, swelling, or respiratory distress, including wheezing.) A component of a COVID-19 vaccine or a previous dose of COVID-19 vaccine? No   Have you ever had an allergic reaction to another vaccine (other thanCOVID-19 vaccine) or an injectable medication? (This would include a severe allergic reaction or a reaction that caused hives, swelling, or respiratory distress, including wheezing.)   No    Do you have a history of any of the following:  Myocarditis or Pericarditis No  Dermal fillers:  No  Multisystem Inflammatory Syndrome (MIS-C or MIS-A)? No  COVID-19 disease within the past 3 months? No  Vaccinated with monkeypox vaccine in the last 4 weeks? No   Tolerated well. VIS provided.  COVID card updated and [facility] updated and copy provided. Fluzone HD IM Left deltoid and Pfizer COVID IM in right deltoid.

## 2022-10-27 NOTE — Telephone Encounter (Signed)
Tried calling pt her line was busy

## 2022-10-27 NOTE — Telephone Encounter (Signed)
Sent in 30 day supply of this medication for this patient , tried to call this patient , wasn't no able to lvm no vm.

## 2022-10-31 ENCOUNTER — Inpatient Hospital Stay: Payer: Medicare PPO

## 2022-10-31 ENCOUNTER — Ambulatory Visit: Payer: Medicare PPO | Admitting: Physician Assistant

## 2022-10-31 ENCOUNTER — Inpatient Hospital Stay: Payer: Medicare PPO | Admitting: Internal Medicine

## 2022-10-31 VITALS — BP 183/98 | HR 91 | Ht 66.5 in | Wt 197.0 lb

## 2022-10-31 DIAGNOSIS — R351 Nocturia: Secondary | ICD-10-CM | POA: Diagnosis not present

## 2022-10-31 LAB — BLADDER SCAN AMB NON-IMAGING: Scan Result: 2

## 2022-10-31 MED ORDER — TROSPIUM CHLORIDE 20 MG PO TABS: ORAL_TABLET | ORAL | 11 refills | Status: DC

## 2022-10-31 MED FILL — Iron Sucrose Inj 20 MG/ML (Fe Equiv): INTRAVENOUS | Qty: 10 | Status: AC

## 2022-10-31 NOTE — Progress Notes (Signed)
10/31/2022 9:39 AM   Georgina Peer Jun 14, 1946 [id number]  CC: Chief Complaint  Patient presents with   Medication Problem   HPI: Dalaney Needle is a 76 y.o. female with PMH nocturia on trospium 20 mg nightly who presents today for annual follow-up.   Today she reports she has continued trospium and is currently voiding once overnight.  She is very pleased with her progress.  She denies constipation, dry mouth, and dry eye on trospium.  She denies any dysuria, flank pain, or gross hematuria in the past year. PVR 55m.  PMH: Past Medical History:  Diagnosis Date   Allergy    Asthma    Diabetes mellitus    Hyperlipidemia    Hypertension    Neuromuscular disorder ([place]    polymyositis   Osteoarthritis    Vitamin B12 deficiency     Surgical History: Past Surgical History:  Procedure Laterality Date   COLONOSCOPY WITH PROPOFOL N/A 03/07/2016   Procedure: COLONOSCOPY WITH PROPOFOL;  Surgeon: MLollie Sails MD;  [facility];  Service: Endoscopy;  Laterality: N/A;    Home Medications:  Allergies as of 10/31/2022       Reactions   Atorvastatin Other (See Comments)   myalgias   Pravastatin Other (See Comments)   Muscle pain        Medication List        Accurate as of October 31, 2022  9:39 AM. If you have any questions, ask your nurse or doctor.          Accu-Chek Aviva Soln Use as directed   Accu-Chek FastClix Lancets Misc USE TO TEST BLOOD SUGAR TWICE DAILY DX E11.40   Accu-Chek SmartView test strip Generic drug: glucose blood USE AS DIRECTED TO CHECK BLOOD SUGAR TWICE DAILY   acetaminophen 500 MG tablet Commonly known as: TYLENOL Take 500 mg by mouth every 6 (six) hours as needed.   B-D SINGLE USE SWABS REGULAR Pads Use to test blood sugar once daily dx: 250.00   BD Syringe Slip Tip 25G X 5/8" 1 ML Misc Generic drug: TUBERCULIN SYR 1CC/25GX5/8"   cyclobenzaprine 5 MG tablet Commonly known as: FLEXERIL Take 5 mg by  mouth 2 (two) times daily.   ferrous sulfate 325 (65 FE) MG tablet Take 325 mg by mouth daily with breakfast.   folic acid 1 MG tablet Commonly known as: FOLVITE Take 1 mg by mouth daily.   gabapentin 300 MG capsule Commonly known as: NEURONTIN Take 1 capsule by mouth 3 (three) times daily.   Lantus 100 UNIT/ML injection Generic drug: insulin glargine INJECT 10 TO 20 UNITS INTO THE SKIN DAILY AS DIRECTED   lisinopril 10 MG tablet Commonly known as: ZESTRIL TAKE 1 TABLET BY MOUTH ONCE A DAY   magnesium citrate Soln Take 148 mLs by mouth once a week.   metFORMIN 1000 MG tablet Commonly known as: GLUCOPHAGE TAKE 1 TABLET BY MOUTH TWICE A DAY   methotrexate 250 MG/10ML injection Inject into the skin.   metoprolol succinate 25 MG 24 hr tablet Commonly known as: TOPROL-XL Take 1 tablet (25 mg total) by mouth daily.   multivitamin tablet Take 1 tablet by mouth daily.   predniSONE 2.5 MG tablet Commonly known as: DELTASONE Take 2.5 mg by mouth daily.   trospium 20 MG tablet Commonly known as: SANCTURA Take 1 tab 1 hour prior to bedtime   TRUEplus Insulin Syringe 31G X 5/16" 0.5 ML Misc Generic drug: Insulin Syringe-Needle U-100   Vitamin  D 1000 units capsule Take 1,000 Units by mouth daily.        Allergies:  Allergies  Allergen Reactions   Atorvastatin Other (See Comments)    myalgias   Pravastatin Other (See Comments)    Muscle pain    Family History: Family History  Problem Relation Age of Onset   Heart disease Mother    Cancer Father    Breast cancer Neg Hx     Social History:   reports that she has quit smoking. She has never used smokeless tobacco. She reports that she does not drink alcohol and does not use drugs.  Physical Exam: BP (!) 183/98   Pulse 91   Ht 5' 6.5" (1.689 m)   Wt 197 lb (89.4 kg)   BMI 31.32 kg/m   Constitutional:  Alert and oriented, no acute distress, nontoxic appearing HEENT: [place], AT Cardiovascular: No clubbing,  cyanosis, or edema Respiratory: Normal respiratory effort, no increased work of breathing Skin: No rashes, bruises or suspicious lesions Neurologic: Grossly intact, no focal deficits, moving all 4 extremities Psychiatric: Normal mood and affect  Laboratory Data: Results for orders placed or performed in visit on 10/31/22  Bladder Scan (Post Void Residual) in office  Result Value Ref Range   Scan Result 2    Assessment & Plan:   1. Nocturia Well managed on trospium 20 mg nightly.  She is emptying appropriately today and tolerating the medication without anticholinergic side effects.  We will plan to continue this and see her for annual follow-up next year. - Bladder Scan (Post Void Residual) in office - trospium (SANCTURA) 20 MG tablet; Take 1 tab 1 hour prior to bedtime  Dispense: 30 tablet; Refill: 11   Return in about 1 year (around 11/01/2023) for Annual nocturia f/u with PVR.  Debroah Loop, PA-C  [facility] [address], [place], [place] [address] [phone]

## 2022-11-01 ENCOUNTER — Inpatient Hospital Stay: Payer: Medicare PPO

## 2022-11-01 ENCOUNTER — Inpatient Hospital Stay: Payer: Medicare PPO | Attending: Internal Medicine

## 2022-11-01 ENCOUNTER — Inpatient Hospital Stay (HOSPITAL_BASED_OUTPATIENT_CLINIC_OR_DEPARTMENT_OTHER): Payer: Medicare PPO | Admitting: Medical Oncology

## 2022-11-01 ENCOUNTER — Encounter: Payer: Self-pay | Admitting: Medical Oncology

## 2022-11-01 VITALS — BP 140/74 | HR 96 | Temp 96.2°F | Resp 16 | Wt 198.0 lb

## 2022-11-01 DIAGNOSIS — Z87891 Personal history of nicotine dependence: Secondary | ICD-10-CM | POA: Diagnosis not present

## 2022-11-01 DIAGNOSIS — D649 Anemia, unspecified: Secondary | ICD-10-CM | POA: Insufficient documentation

## 2022-11-01 DIAGNOSIS — Z809 Family history of malignant neoplasm, unspecified: Secondary | ICD-10-CM | POA: Insufficient documentation

## 2022-11-01 DIAGNOSIS — Z79899 Other long term (current) drug therapy: Secondary | ICD-10-CM

## 2022-11-01 DIAGNOSIS — M3320 Polymyositis, organ involvement unspecified: Secondary | ICD-10-CM

## 2022-11-01 LAB — COMPREHENSIVE METABOLIC PANEL
ALT: 30 U/L (ref 0–44)
AST: 33 U/L (ref 15–41)
Albumin: 3.6 g/dL (ref 3.5–5.0)
Alkaline Phosphatase: 52 U/L (ref 38–126)
Anion gap: 8 (ref 5–15)
BUN: 21 mg/dL (ref 8–23)
CO2: 24 mmol/L (ref 22–32)
Calcium: 9.3 mg/dL (ref 8.9–10.3)
Chloride: 105 mmol/L (ref 98–111)
Creatinine, Ser: 0.87 mg/dL (ref 0.44–1.00)
GFR, Estimated: >60 mL/min (ref >60)
Glucose, Bld: 160 mg/dL — ABNORMAL HIGH (ref 70–99)
Potassium: 4.1 mmol/L (ref 3.5–5.1)
Sodium: 137 mmol/L (ref 135–145)
Total Bilirubin: 0.6 mg/dL (ref 0.3–1.2)
Total Protein: 7.3 g/dL (ref 6.5–8.1)

## 2022-11-01 LAB — CBC WITH DIFFERENTIAL/PLATELET
Abs Immature Granulocytes: 0.03 10*3/uL (ref 0.00–0.07)
Basophils Absolute: 0 10*3/uL (ref 0.0–0.1)
Basophils Relative: 1 %
Eosinophils Absolute: 0.1 10*3/uL (ref 0.0–0.5)
Eosinophils Relative: 2 %
HCT: 31.6 % — ABNORMAL LOW (ref 36.0–46.0)
Hemoglobin: 10 g/dL — ABNORMAL LOW (ref 12.0–15.0)
Immature Granulocytes: 1 %
Lymphocytes Relative: 17 %
Lymphs Abs: 1.1 10*3/uL (ref 0.7–4.0)
MCH: 29.5 pg (ref 26.0–34.0)
MCHC: 31.6 g/dL (ref 30.0–36.0)
MCV: 93.2 fL (ref 80.0–100.0)
Monocytes Absolute: 0.3 10*3/uL (ref 0.1–1.0)
Monocytes Relative: 5 %
Neutro Abs: 4.7 10*3/uL (ref 1.7–7.7)
Neutrophils Relative %: 74 %
Platelets: 286 10*3/uL (ref 150–400)
RBC: 3.39 MIL/uL — ABNORMAL LOW (ref 3.87–5.11)
RDW: 14.1 % (ref 11.5–15.5)
WBC: 6.2 10*3/uL (ref 4.0–10.5)
nRBC: 0 % (ref 0.0–0.2)

## 2022-11-01 LAB — LACTATE DEHYDROGENASE: LDH: 174 U/L (ref 98–192)

## 2022-11-01 LAB — C-REACTIVE PROTEIN: CRP: 0.8 mg/dL (ref <1.0)

## 2022-11-01 NOTE — Progress Notes (Signed)
[facility] NOTE  Patient Care Team: [name], MD as PCP - General (Internal Medicine) Emmaline Kluver., MD (Rheumatology) Cammie Sickle, MD as Consulting Physician (Internal Medicine)  CHIEF COMPLAINTS/PURPOSE OF CONSULTATION: Polymyositis/; Anemia  #Polymyositis [Dr. [name]; 2019; left arm biopsy]; low dose prednisone/methotrexate/IVIG infusions 400 mg kilogram daily x4 days every 6 weeks [facility]; Dr.Patel] Oncology History   No history exists.     HISTORY OF PRESENTING ILLNESS: Alone.  Walks independently.  Bridget Romero 76 y.o.  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   Patient has had longstanding anemia for the last 3 to 4 years.Thought to be secondary to her polymyositis. She continues to be seen and followed by rheumatology for this. No changes to her medications or treatment regimen anytime recently. She continues to take an iron supplement and eats an iron rich diet. She is UTD on her colonoscopy screenings and reports that she is next due in 8 years for another.   She denies any blood in stools or black-colored stools.  Denies any blood in urine. Denies vaginal bleeding or hemoptysis.   Patient denies any worsening weakness in arms or legs.  No falls.  No headaches.  No nausea no vomiting no fevers or chills.  Wt Readings from Last 3 Encounters:  11/01/22 198 lb (89.8 kg)  10/31/22 197 lb (89.4 kg)  06/30/22 199 lb 9.6 oz (90.5 kg)    Review of Systems  Constitutional:  Negative for chills, diaphoresis, fever, malaise/fatigue and weight loss.  HENT:  Negative for nosebleeds and sore throat.   Eyes:  Negative for double vision.  Respiratory:  Negative for cough, hemoptysis, sputum production, shortness of breath and wheezing.   Cardiovascular:  Negative for chest pain, palpitations, orthopnea and leg swelling.  Gastrointestinal:  Negative for abdominal pain,  blood in stool, constipation, diarrhea, heartburn, melena, nausea and vomiting.  Genitourinary:  Negative for dysuria, frequency and urgency.  Musculoskeletal:  Negative for back pain and joint pain.  Skin: Negative.  Negative for itching and rash.  Neurological:  Negative for dizziness, tingling, focal weakness, weakness and headaches.  Endo/Heme/Allergies:  Does not bruise/bleed easily.  Psychiatric/Behavioral:  Negative for depression. The patient is not nervous/anxious and does not have insomnia.      MEDICAL HISTORY:  Past Medical History:  Diagnosis Date   Allergy    Asthma    Diabetes mellitus    Hyperlipidemia    Hypertension    Neuromuscular disorder ([place])    polymyositis   Osteoarthritis    Vitamin B12 deficiency     SURGICAL HISTORY: Past Surgical History:  Procedure Laterality Date   COLONOSCOPY WITH PROPOFOL N/A 03/07/2016   Procedure: COLONOSCOPY WITH PROPOFOL;  Surgeon: Lollie Sails, MD;  Location: [facility] ENDOSCOPY;  Service: Endoscopy;  Laterality: N/A;    SOCIAL HISTORY: Social History   Socioeconomic History   Marital status: Widowed    Spouse name: Not on file   Number of children: 2   Years of education: Not on file   Highest education level: Not on file  Occupational History   Occupation: Retired as Training and development officer at [employer]:    Tobacco Use   Smoking status: Former   Smokeless tobacco: Never   Tobacco comments:    age 66 when stopped smoking  Vaping Use   Vaping Use: Never used  Substance and Sexual Activity  Alcohol use: No   Drug use: No   Sexual activity: Not on file  Other Topics Concern   Not on file  Social History Narrative   has living will   Son and daughter should make health care decisions for her   Would accept resuscitation   Not sure about tube feeds   Social Determinants of Health   Financial Resource Strain: Not on file  Food Insecurity: Not on file  Transportation Needs: Not on file   Physical Activity: Not on file  Stress: Not on file  Social Connections: Not on file  Intimate Partner Violence: Not on file    FAMILY HISTORY: Family History  Problem Relation Age of Onset   Heart disease Mother    Cancer Father    Breast cancer Neg Hx     ALLERGIES:  is allergic to atorvastatin and pravastatin.  MEDICATIONS:  Current Outpatient Medications  Medication Sig Dispense Refill   Accu-Chek FastClix Lancets MISC USE TO TEST BLOOD SUGAR TWICE DAILY DX E11.40 102 each 11   ACCU-CHEK SMARTVIEW test strip USE AS DIRECTED TO CHECK BLOOD SUGAR TWICE DAILY 100 each 6   acetaminophen (TYLENOL) 500 MG tablet Take 500 mg by mouth every 6 (six) hours as needed.     Alcohol Swabs (B-D SINGLE USE SWABS REGULAR) PADS Use to test blood sugar once daily dx: 250.00 100 each 3   BD SYRINGE SLIP TIP 25G X 5/8" 1 ML MISC      Blood Glucose Calibration (ACCU-CHEK AVIVA) SOLN Use as directed 1 each 3   Cholecalciferol (VITAMIN D) 1000 UNITS capsule Take 1,000 Units by mouth daily.     cyclobenzaprine (FLEXERIL) 5 MG tablet Take 5 mg by mouth 2 (two) times daily.     ferrous sulfate 325 (65 FE) MG tablet Take 325 mg by mouth daily with breakfast.     folic acid (FOLVITE) 1 MG tablet Take 1 mg by mouth daily.     gabapentin (NEURONTIN) 300 MG capsule Take 1 capsule by mouth 3 (three) times daily.     LANTUS 100 UNIT/ML injection INJECT 10 TO 20 UNITS INTO THE SKIN DAILY AS DIRECTED 10 mL 3   lisinopril (ZESTRIL) 10 MG tablet TAKE 1 TABLET BY MOUTH ONCE A DAY 90 tablet 3   metFORMIN (GLUCOPHAGE) 1000 MG tablet TAKE 1 TABLET BY MOUTH TWICE A DAY 180 tablet 3   methotrexate 250 MG/10ML injection Inject into the skin.      metoprolol succinate (TOPROL-XL) 25 MG 24 hr tablet Take 1 tablet (25 mg total) by mouth daily. 90 tablet 3   Multiple Vitamin (MULTIVITAMIN) tablet Take 1 tablet by mouth daily.     predniSONE (DELTASONE) 2.5 MG tablet Take 2.5 mg by mouth daily.     trospium (SANCTURA) 20  MG tablet Take 1 tab 1 hour prior to bedtime 30 tablet 11   TRUEPLUS INSULIN SYRINGE 31G X 5/16" 0.5 ML MISC      magnesium citrate SOLN Take 148 mLs by mouth once a week. (Patient not taking: Reported on 11/01/2022)     No current facility-administered medications for this visit.   Facility-Administered Medications Ordered in Other Visits  Medication Dose Route Frequency Provider Last Rate Last Admin   dextrose 5 % solution   Intravenous Continuous Cammie Sickle, MD   Stopped at 08/30/21 1350      .  PHYSICAL EXAMINATION: ECOG PERFORMANCE STATUS: 0 - Asymptomatic  Vitals:   11/01/22 1413  BP: Marland Kitchen)  140/74  Pulse: 96  Resp: 16  Temp: (!) 96.2 F (35.7 C)  SpO2: 98%   Filed Weights   11/01/22 1413  Weight: 198 lb (89.8 kg)    Physical Exam Vitals and nursing note reviewed.  Constitutional:      Comments:    HENT:     Head: Normocephalic and atraumatic.     Mouth/Throat:     Pharynx: Oropharynx is clear.  Eyes:     Extraocular Movements: Extraocular movements intact.     Pupils: Pupils are equal, round, and reactive to light.  Cardiovascular:     Rate and Rhythm: Normal rate and regular rhythm.  Pulmonary:     Effort: Pulmonary effort is normal.     Breath sounds: Normal breath sounds.     Comments: Decreased breath sounds bilaterally.  Abdominal:     Palpations: Abdomen is soft.  Musculoskeletal:        General: Normal range of motion.     Cervical back: Normal range of motion.  Skin:    General: Skin is warm.  Neurological:     General: No focal deficit present.     Mental Status: She is alert and oriented to person, place, and time.  Psychiatric:        Behavior: Behavior normal.        Judgment: Judgment normal.     LABORATORY DATA:  I have reviewed the data as listed Lab Results  Component Value Date   WBC 6.2 11/01/2022   HGB 10.0 (L) 11/01/2022   HCT 31.6 (L) 11/01/2022   MCV 93.2 11/01/2022   PLT 286 11/01/2022   Recent Labs     06/30/22 1324 08/01/22 0922 11/01/22 1350  NA 136 141 137  K 4.0 3.8 4.1  CL 105 111 105  CO2 '25 23 24  '$ GLUCOSE 120* 166* 160*  BUN 18 24* 21  CREATININE 0.81 0.78 0.87  CALCIUM 8.8* 9.4 9.3  GFRNONAA >60 >60 >60  PROT 7.0 7.1 7.3  ALBUMIN 3.4* 3.7 3.6  AST 32 31 33  ALT '25 20 30  '$ ALKPHOS 46 64 52  BILITOT 0.6 0.7 0.6    RADIOGRAPHIC STUDIES: I have personally reviewed the radiological images as listed and agreed with the findings in the report. No results found.  ASSESSMENT & PLAN:   Symptomatic anemia # Normocytic anemia: chronic Hb ~9-10. Today Hgb is 10. Previous iron studies have been stable on oral iron. Recommend continuing gentle iron 1 pill a day. Per Dr. Stacey Drain last note it is suggested that her anemia is likely related to her polymyositis.  If not improved consider further work-up including myeloma panel; GI work-up etc.   # Polymyositis [Dr. [name]; Clinically, she is doing well on IVIG 2 g/kg every 6 weeks [Short stay]; also prednisone 2.5 mg a day and MTX 10 mg SQ weekly.; JUNE 2023- 381-defer to Dr.Patel; Rheum. No changes to plan- patient will alert [place] if changes to her plan from rheumatology are made as we may need to monitor her more closely should this occur.    # Disposition:  No venofer needed today # follow up in 4 months- MD; cbc/bmp; CRP; LDH, ferritin, iron/TIBC; possible venofer- [place]  All questions were answered. The patient knows to call the clinic with any problems, questions or concerns.    Hughie Closs, PA-C 11/01/2022 2:27 PM

## 2022-11-01 NOTE — Progress Notes (Signed)
Pt in for follow up, denies any concerns today. 

## 2022-11-09 ENCOUNTER — Telehealth: Payer: Self-pay | Admitting: Internal Medicine

## 2022-11-09 NOTE — Telephone Encounter (Signed)
Tried to call pt . No answer.  

## 2022-11-09 NOTE — Telephone Encounter (Signed)
Patient called and stated she got a letter from [facility] about her medication and she like to discuss the letter. Call back number [phone].

## 2022-11-13 NOTE — Telephone Encounter (Signed)
Spoke to pt outside of the office. She said she will bring the letter to the office so we know which medication they are not going to cover next year.

## 2022-11-20 ENCOUNTER — Other Ambulatory Visit: Payer: Self-pay | Admitting: Internal Medicine

## 2022-11-21 ENCOUNTER — Ambulatory Visit
Admission: RE | Admit: 2022-11-21 | Discharge: 2022-11-21 | Disposition: A | Discharge status: Home / Self Care | Payer: Medicare PPO | Source: Ambulatory Visit | Attending: Rheumatology | Admitting: Rheumatology

## 2022-11-21 VITALS — BP 137/86 | HR 77 | Temp 98.0°F | Resp 16 | Ht 66.5 in | Wt 198.0 lb

## 2022-11-21 DIAGNOSIS — Z7960 Long term (current) use of unspecified immunomodulators and immunosuppressants: Secondary | ICD-10-CM | POA: Diagnosis not present

## 2022-11-21 DIAGNOSIS — M3322 Polymyositis with myopathy: Secondary | ICD-10-CM | POA: Diagnosis not present

## 2022-11-21 DIAGNOSIS — M3320 Polymyositis, organ involvement unspecified: Secondary | ICD-10-CM

## 2022-11-21 DIAGNOSIS — M5116 Intervertebral disc disorders with radiculopathy, lumbar region: Secondary | ICD-10-CM | POA: Diagnosis not present

## 2022-11-21 LAB — CBC WITH DIFFERENTIAL/PLATELET
Abs Immature Granulocytes: 0.02 10*3/uL (ref 0.00–0.07)
Basophils Absolute: 0 10*3/uL (ref 0.0–0.1)
Basophils Relative: 1 %
Eosinophils Absolute: 0.2 10*3/uL (ref 0.0–0.5)
Eosinophils Relative: 3 %
HCT: 30.2 % — ABNORMAL LOW (ref 36.0–46.0)
Hemoglobin: 9.7 g/dL — ABNORMAL LOW (ref 12.0–15.0)
Immature Granulocytes: 0 %
Lymphocytes Relative: 21 %
Lymphs Abs: 1.3 10*3/uL (ref 0.7–4.0)
MCH: 29.2 pg (ref 26.0–34.0)
MCHC: 32.1 g/dL (ref 30.0–36.0)
MCV: 91 fL (ref 80.0–100.0)
Monocytes Absolute: 0.4 10*3/uL (ref 0.1–1.0)
Monocytes Relative: 6 %
Neutro Abs: 4.2 10*3/uL (ref 1.7–7.7)
Neutrophils Relative %: 69 %
Platelets: 292 10*3/uL (ref 150–400)
RBC: 3.32 MIL/uL — ABNORMAL LOW (ref 3.87–5.11)
RDW: 14.6 % (ref 11.5–15.5)
WBC: 6.1 10*3/uL (ref 4.0–10.5)
nRBC: 0 % (ref 0.0–0.2)

## 2022-11-21 LAB — COMPREHENSIVE METABOLIC PANEL
ALT: 29 U/L (ref 0–44)
AST: 37 U/L (ref 15–41)
Albumin: 3.8 g/dL (ref 3.5–5.0)
Alkaline Phosphatase: 57 U/L (ref 38–126)
Anion gap: 6 (ref 5–15)
BUN: 21 mg/dL (ref 8–23)
CO2: 27 mmol/L (ref 22–32)
Calcium: 9.5 mg/dL (ref 8.9–10.3)
Chloride: 108 mmol/L (ref 98–111)
Creatinine, Ser: 0.8 mg/dL (ref 0.44–1.00)
GFR, Estimated: >60 mL/min (ref >60)
Glucose, Bld: 160 mg/dL — ABNORMAL HIGH (ref 70–99)
Potassium: 3.7 mmol/L (ref 3.5–5.1)
Sodium: 141 mmol/L (ref 135–145)
Total Bilirubin: 0.7 mg/dL (ref 0.3–1.2)
Total Protein: 7 g/dL (ref 6.5–8.1)

## 2022-11-21 LAB — CK: Total CK: 595 U/L — ABNORMAL HIGH (ref 38–234)

## 2022-11-21 MED ORDER — ACETAMINOPHEN 325 MG PO TABS
650.0000 mg | ORAL_TABLET | Freq: Once | ORAL | Status: AC
  Administered 2022-11-21: 650 mg via ORAL

## 2022-11-21 MED ORDER — METHYLPREDNISOLONE SODIUM SUCC 40 MG IJ SOLR
10.0000 mg | Freq: Once | INTRAMUSCULAR | Status: AC
  Administered 2022-11-21: 10 mg via INTRAVENOUS
  Filled 2022-11-21: qty 1

## 2022-11-21 MED ORDER — DIPHENHYDRAMINE HCL 25 MG PO CAPS
ORAL_CAPSULE | ORAL | Status: AC
  Filled 2022-11-21: qty 1

## 2022-11-21 MED ORDER — DEXTROSE 5 % IV SOLN: INTRAVENOUS | Status: DC

## 2022-11-21 MED ORDER — DIPHENHYDRAMINE HCL 25 MG PO CAPS
25.0000 mg | ORAL_CAPSULE | Freq: Once | ORAL | Status: AC
  Administered 2022-11-21: 25 mg via ORAL

## 2022-11-21 MED ORDER — IMMUNE GLOBULIN (HUMAN) 10 GM/100ML IV SOLN
400.0000 mg/kg | Freq: Once | INTRAVENOUS | Status: AC
  Administered 2022-11-21: 30 g via INTRAVENOUS
  Filled 2022-11-21: qty 300

## 2022-11-21 MED ORDER — ACETAMINOPHEN 325 MG PO TABS
ORAL_TABLET | ORAL | Status: AC
  Filled 2022-11-21: qty 2

## 2022-11-21 NOTE — Discharge Instructions (Signed)
Keep appointment for tomorrow in Day Surgery, same time.

## 2022-11-22 ENCOUNTER — Ambulatory Visit
Admission: RE | Admit: 2022-11-22 | Discharge: 2022-11-22 | Disposition: A | Discharge status: Home / Self Care | Payer: Medicare PPO | Source: Ambulatory Visit | Attending: Rheumatology | Admitting: Rheumatology

## 2022-11-22 DIAGNOSIS — M3322 Polymyositis with myopathy: Secondary | ICD-10-CM | POA: Diagnosis not present

## 2022-11-22 LAB — HAPTOGLOBIN: Haptoglobin: 158 mg/dL (ref 42–346)

## 2022-11-22 LAB — ALDOLASE: Aldolase: 7.7 U/L (ref 3.3–10.3)

## 2022-11-22 MED ORDER — ACETAMINOPHEN 325 MG PO TABS
ORAL_TABLET | ORAL | Status: AC
  Filled 2022-11-22: qty 2

## 2022-11-22 MED ORDER — ACETAMINOPHEN 325 MG PO TABS
650.0000 mg | ORAL_TABLET | Freq: Once | ORAL | Status: AC
  Administered 2022-11-22: 650 mg via ORAL

## 2022-11-22 MED ORDER — METHYLPREDNISOLONE SODIUM SUCC 40 MG IJ SOLR
10.0000 mg | Freq: Once | INTRAMUSCULAR | Status: AC
  Administered 2022-11-22: 10 mg via INTRAVENOUS
  Filled 2022-11-22: qty 0.25

## 2022-11-22 MED ORDER — IMMUNE GLOBULIN (HUMAN) 10 GM/100ML IV SOLN
400.0000 mg/kg | Freq: Once | INTRAVENOUS | Status: AC
  Administered 2022-11-22: 30 g via INTRAVENOUS
  Filled 2022-11-22: qty 300

## 2022-11-22 MED ORDER — DIPHENHYDRAMINE HCL 25 MG PO CAPS
ORAL_CAPSULE | ORAL | Status: AC
  Filled 2022-11-22: qty 1

## 2022-11-22 MED ORDER — DIPHENHYDRAMINE HCL 25 MG PO CAPS
25.0000 mg | ORAL_CAPSULE | Freq: Once | ORAL | Status: AC
  Administered 2022-11-22: 25 mg via ORAL

## 2022-11-22 MED ORDER — DEXTROSE 5 % IV SOLN: INTRAVENOUS | Status: DC

## 2022-11-23 ENCOUNTER — Ambulatory Visit
Admission: RE | Admit: 2022-11-23 | Discharge: 2022-11-23 | Disposition: A | Discharge status: Home / Self Care | Payer: Medicare PPO | Source: Ambulatory Visit | Attending: Rheumatology | Admitting: Rheumatology

## 2022-11-23 DIAGNOSIS — M3320 Polymyositis, organ involvement unspecified: Secondary | ICD-10-CM | POA: Diagnosis not present

## 2022-11-23 MED ORDER — IMMUNE GLOBULIN (HUMAN) 10 GM/100ML IV SOLN
400.0000 mg/kg | Freq: Once | INTRAVENOUS | Status: AC
  Administered 2022-11-23: 30 g via INTRAVENOUS
  Filled 2022-11-23: qty 300

## 2022-11-23 MED ORDER — DIPHENHYDRAMINE HCL 25 MG PO CAPS: 25.0000 mg | ORAL_CAPSULE | Freq: Once | ORAL | Status: AC

## 2022-11-23 MED ORDER — METHYLPREDNISOLONE SODIUM SUCC 125 MG IJ SOLR
INTRAMUSCULAR | Status: AC
  Filled 2022-11-23: qty 2

## 2022-11-23 MED ORDER — DIPHENHYDRAMINE HCL 25 MG PO CAPS
ORAL_CAPSULE | ORAL | Status: AC
  Administered 2022-11-23: 25 mg via ORAL
  Filled 2022-11-23: qty 1

## 2022-11-23 MED ORDER — STERILE WATER FOR INJECTION IJ SOLN
INTRAMUSCULAR | Status: AC
  Filled 2022-11-23: qty 10

## 2022-11-23 MED ORDER — ACETAMINOPHEN 325 MG PO TABS
ORAL_TABLET | ORAL | Status: AC
  Administered 2022-11-23: 650 mg via ORAL
  Filled 2022-11-23: qty 2

## 2022-11-23 MED ORDER — METHYLPREDNISOLONE SODIUM SUCC 40 MG IJ SOLR
10.0000 mg | Freq: Once | INTRAMUSCULAR | Status: AC
  Administered 2022-11-23: 10 mg via INTRAVENOUS

## 2022-11-23 MED ORDER — IMMUNE GLOBULIN (HUMAN) 10 GM/100ML IV SOLN
400.0000 mg/kg | Freq: Once | INTRAVENOUS | Status: DC
  Filled 2022-11-23: qty 300

## 2022-11-23 MED ORDER — ACETAMINOPHEN 325 MG PO TABS: 650.0000 mg | ORAL_TABLET | Freq: Once | ORAL | Status: AC

## 2022-11-24 ENCOUNTER — Ambulatory Visit
Admission: RE | Admit: 2022-11-24 | Discharge: 2022-11-24 | Disposition: A | Discharge status: Home / Self Care | Payer: Medicare PPO | Source: Ambulatory Visit | Attending: Rheumatology | Admitting: Rheumatology

## 2022-11-24 DIAGNOSIS — M3320 Polymyositis, organ involvement unspecified: Secondary | ICD-10-CM | POA: Insufficient documentation

## 2022-11-24 MED ORDER — ACETAMINOPHEN 325 MG PO TABS
ORAL_TABLET | ORAL | Status: AC
  Filled 2022-11-24: qty 2

## 2022-11-24 MED ORDER — ACETAMINOPHEN 325 MG PO TABS
650.0000 mg | ORAL_TABLET | Freq: Once | ORAL | Status: AC
  Administered 2022-11-24: 650 mg via ORAL

## 2022-11-24 MED ORDER — DIPHENHYDRAMINE HCL 25 MG PO CAPS
ORAL_CAPSULE | ORAL | Status: AC
  Filled 2022-11-24: qty 1

## 2022-11-24 MED ORDER — DIPHENHYDRAMINE HCL 25 MG PO CAPS
25.0000 mg | ORAL_CAPSULE | Freq: Once | ORAL | Status: AC
  Administered 2022-11-24: 25 mg via ORAL

## 2022-11-24 MED ORDER — IMMUNE GLOBULIN (HUMAN) 10 GM/100ML IV SOLN
400.0000 mg/kg | Freq: Once | INTRAVENOUS | Status: AC
  Administered 2022-11-24: 30 g via INTRAVENOUS
  Filled 2022-11-24: qty 300

## 2022-11-24 MED ORDER — METHYLPREDNISOLONE SODIUM SUCC 40 MG IJ SOLR
10.0000 mg | Freq: Once | INTRAMUSCULAR | Status: AC
  Administered 2022-11-24: 10 mg via INTRAVENOUS
  Filled 2022-11-24: qty 0.25

## 2022-12-21 ENCOUNTER — Encounter: Payer: Self-pay | Admitting: Internal Medicine

## 2022-12-21 ENCOUNTER — Ambulatory Visit (INDEPENDENT_AMBULATORY_CARE_PROVIDER_SITE_OTHER): Payer: Medicare PPO | Admitting: Internal Medicine

## 2022-12-21 VITALS — BP 138/82 | HR 82 | Temp 97.0°F | Ht 65.5 in | Wt 199.0 lb

## 2022-12-21 DIAGNOSIS — E1140 Type 2 diabetes mellitus with diabetic neuropathy, unspecified: Secondary | ICD-10-CM | POA: Diagnosis not present

## 2022-12-21 DIAGNOSIS — I10 Essential (primary) hypertension: Secondary | ICD-10-CM | POA: Diagnosis not present

## 2022-12-21 DIAGNOSIS — Z794 Long term (current) use of insulin: Secondary | ICD-10-CM

## 2022-12-21 DIAGNOSIS — N3281 Overactive bladder: Secondary | ICD-10-CM | POA: Diagnosis not present

## 2022-12-21 DIAGNOSIS — M3320 Polymyositis, organ involvement unspecified: Secondary | ICD-10-CM | POA: Diagnosis not present

## 2022-12-21 DIAGNOSIS — Z0000 Encounter for general adult medical examination without abnormal findings: Secondary | ICD-10-CM

## 2022-12-21 LAB — HM DIABETES FOOT EXAM

## 2022-12-21 NOTE — Progress Notes (Signed)
Subjective:    Patient ID: Bridget Romero, female    DOB: 01/22/1946, 76 y.o.   MRN: [id number]  HPI Here for Medicare wellness visit and follow up of chronic health conditions Reviewed advanced directives Reviewed other doctors---Dr Patel--rheumatology, [facility], Dr Maxie Barb, Dr Bernardo Heater (Ms Vaillancourt)--urology No hospitalizations or surgery in the past year Does try to do home exercises--light resistance. Doesn't really do aerobic--discussed Vision is okay--recent eye exam No hearing problems--didn't do well with the whisper No alcohol or tobacco No falls No depression or anhedonia No sig memory problems  Doing well Myositis controlled with IV methotrexate and low dose prednisone Independent with all instrumental ADLs  She is off the metoprolol  No palpitations No dizziness or syncope No chest pain or SOB  Checks sugar twice a day Ranges from 98 -120---rarely up to 130 Some mild foot burning at night---not too bad---on gabapentin tid No hypoglycemia  Still on 10 units of insulin  Doing well on trospium Helps her OAB  Current Outpatient Medications on File Prior to Visit  Medication Sig Dispense Refill   Accu-Chek FastClix Lancets MISC USE TO TEST BLOOD SUGAR TWICE DAILY DX E11.40 102 each 11   ACCU-CHEK SMARTVIEW test strip USE AS DIRECTED TO CHECK BLOOD SUGAR TWICE DAILY 100 each 6   acetaminophen (TYLENOL) 500 MG tablet Take 500 mg by mouth every 6 (six) hours as needed.     Alcohol Swabs (B-D SINGLE USE SWABS REGULAR) PADS Use to test blood sugar once daily dx: 250.00 100 each 3   BD SYRINGE SLIP TIP 25G X 5/8" 1 ML MISC      Blood Glucose Calibration (ACCU-CHEK AVIVA) SOLN Use as directed 1 each 3   Cholecalciferol (VITAMIN D) 1000 UNITS capsule Take 1,000 Units by mouth daily.     cyclobenzaprine (FLEXERIL) 5 MG tablet Take 5 mg by mouth 2 (two) times daily.     ferrous sulfate 325 (65 FE) MG tablet Take 325 mg by mouth daily with  breakfast.     folic acid (FOLVITE) 1 MG tablet Take 1 mg by mouth daily.     gabapentin (NEURONTIN) 300 MG capsule Take 1 capsule by mouth 3 (three) times daily.     Insulin Syringe-Needle U-100 31G X 5/16" 1 ML MISC by Does not apply route.     LANTUS 100 UNIT/ML injection INJECT 10 TO 20 UNITS INTO THE SKIN DAILY AS DIRECTED 10 mL 3   lisinopril (ZESTRIL) 10 MG tablet TAKE 1 TABLET BY MOUTH ONCE A DAY 90 tablet 3   magnesium citrate SOLN Take 148 mLs by mouth once a week.     metFORMIN (GLUCOPHAGE) 1000 MG tablet TAKE 1 TABLET BY MOUTH TWICE A DAY 180 tablet 3   methotrexate 250 MG/10ML injection Inject into the skin.      Multiple Vitamin (MULTIVITAMIN) tablet Take 1 tablet by mouth daily.     predniSONE (DELTASONE) 2.5 MG tablet Take 2.5 mg by mouth daily.     trospium (SANCTURA) 20 MG tablet Take 1 tab 1 hour prior to bedtime 30 tablet 11   No current facility-administered medications on file prior to visit.    Allergies  Allergen Reactions   Atorvastatin Other (See Comments)    myalgias   Pravastatin Other (See Comments)    Muscle pain    Past Medical History:  Diagnosis Date   Allergy    Asthma    Diabetes mellitus    Hyperlipidemia    Hypertension  Neuromuscular disorder ([place])    polymyositis   Osteoarthritis    Vitamin B12 deficiency     Past Surgical History:  Procedure Laterality Date   COLONOSCOPY WITH PROPOFOL N/A 03/07/2016   Procedure: COLONOSCOPY WITH PROPOFOL;  Surgeon: Lollie Sails, MD;  Location: [facility] ENDOSCOPY;  Service: Endoscopy;  Laterality: N/A;    Family History  Problem Relation Age of Onset   Heart disease Mother    Cancer Father    Breast cancer Neg Hx     Social History   Socioeconomic History   Marital status: Widowed    Spouse name: Not on file   Number of children: 2   Years of education: Not on file   Highest education level: Not on file  Occupational History   Occupation: Retired as Training and development officer at [employer]    Comment:    Tobacco Use   Smoking status: Former    Passive exposure: Never   Smokeless tobacco: Never   Tobacco comments:    age 25 when stopped smoking  Vaping Use   Vaping Use: Never used  Substance and Sexual Activity   Alcohol use: No   Drug use: No   Sexual activity: Not on file  Other Topics Concern   Not on file  Social History Narrative   has living will   Son and daughter should make health care decisions for her   Would accept resuscitation   Not sure about tube feeds   Social Determinants of Health   Financial Resource Strain: Not on file  Food Insecurity: Not on file  Transportation Needs: Not on file  Physical Activity: Not on file  Stress: Not on file  Social Connections: Not on file  Intimate Partner Violence: Not on file   Review of Systems Appetite is good Weight is fairly stable Wears seat belt Sleeps well Teeth okay--overdue for dentist. Discussed. Does have full upper plate No suspicious skin lesions No heartburn or dysphagia Bowels are okay--no blood Some spells of back pain----diagnosed with degenerative disc (had tylenol with codeine briefly)    Objective:   Physical Exam Constitutional:      Appearance: Normal appearance.  HENT:     Mouth/Throat:     Comments: No lesions Eyes:     Conjunctiva/sclera: Conjunctivae normal.     Pupils: Pupils are equal, round, and reactive to light.  Cardiovascular:     Rate and Rhythm: Normal rate and regular rhythm.     Pulses: Normal pulses.     Heart sounds: No murmur heard.    No gallop.  Pulmonary:     Effort: Pulmonary effort is normal.     Breath sounds: Normal breath sounds. No wheezing or rales.  Abdominal:     Palpations: Abdomen is soft.     Tenderness: There is no abdominal tenderness.  Musculoskeletal:     Cervical back: Neck supple.     Right lower leg: No edema.     Left lower leg: No edema.  Lymphadenopathy:     Cervical: No cervical adenopathy.  Skin:    Findings:  No lesion or rash.     Comments: No foot lesions  Neurological:     Mental Status: She is alert and oriented to person, place, and time.     Comments: Mini-cog normal Decreased sensation in plantar feet  Psychiatric:        Mood and Affect: Mood normal.        Behavior: Behavior normal.  Assessment & Plan:

## 2022-12-21 NOTE — Assessment & Plan Note (Signed)
Seems to still have good control Lantus 10 units daily and metformin 1000 bid Gabapentin 300 tid for neuropathy No changes

## 2022-12-21 NOTE — Patient Instructions (Signed)
Please ask the pharmacist for a shingles vaccine and a tetanus booster.

## 2022-12-21 NOTE — Assessment & Plan Note (Signed)
In remission on IV methotrexate and prednisone 2.'5mg'$ 

## 2022-12-21 NOTE — Assessment & Plan Note (Signed)
Satisfied with the trospium 20 daily

## 2022-12-21 NOTE — Assessment & Plan Note (Signed)
BP Readings from Last 3 Encounters:  12/21/22 138/82  11/01/22 (!) 140/74  10/31/22 (!) 183/98   Controlled on the lisinopril '10mg'$  daily [facility] labs okay

## 2022-12-21 NOTE — Assessment & Plan Note (Signed)
I have personally reviewed the Medicare Annual Wellness questionnaire and have noted 1. The patient's medical and social history 2. Their use of alcohol, tobacco or illicit drugs 3. Their current medications and supplements 4. The patient's functional ability including ADL's, fall risks, home safety risks and hearing or visual             impairment. 5. Diet and physical activities 6. Evidence for depression or mood disorders  The patients weight, height, BMI and visual acuity have been recorded in the chart I have made referrals, counseling and provided education to the patient based review of the above and I have provided the pt with a written personalized care plan for preventive services.  I have provided you with a copy of your personalized plan for preventive services. Please take the time to review along with your updated medication list.  Consider last colonoscopy in 2027 Mammograms every 1-2 years--probably till 82 (but at least one more) Discussed adding aerobic exercise Had updated COVID and flu vaccines Td and shingrix at pharmacy

## 2022-12-21 NOTE — Progress Notes (Signed)
Hearing Screening - Comments:: Did not pass whisper test. Vision Screening - Comments:: August 2023

## 2022-12-22 LAB — LIPID PANEL
Cholesterol: 157 mg/dL (ref 0–200)
HDL: 51.2 mg/dL (ref >39.00)
LDL Cholesterol: 74 mg/dL (ref 0–99)
NonHDL: 105.39
Total CHOL/HDL Ratio: 3
Triglycerides: 157 mg/dL — ABNORMAL HIGH (ref 0.0–149.0)
VLDL: 31.4 mg/dL (ref 0.0–40.0)

## 2022-12-22 LAB — MICROALBUMIN / CREATININE URINE RATIO
Creatinine,U: 58 mg/dL
Microalb Creat Ratio: 1.7 mg/g (ref 0.0–30.0)
Microalb, Ur: 1 mg/dL (ref 0.0–1.9)

## 2022-12-22 LAB — HEMOGLOBIN A1C: Hgb A1c MFr Bld: 7.2 % — ABNORMAL HIGH (ref 4.6–6.5)

## 2022-12-31 IMAGING — MG MM DIGITAL SCREENING BILAT W/ TOMO AND CAD
6 of 10 series · 6 of 30 positions shown · non-contrast
Comparison: Previous exam(s).

CLINICAL DATA: Screening.

EXAM:
DIGITAL SCREENING BILATERAL MAMMOGRAM WITH TOMOSYNTHESIS AND CAD
TECHNIQUE: Bilateral screening digital craniocaudal and mediolateral oblique
mammograms were obtained. Bilateral screening digital breast
tomosynthesis was performed. The images were evaluated with
computer-aided detection.

[L MLO synth-2D (1 of 2)]
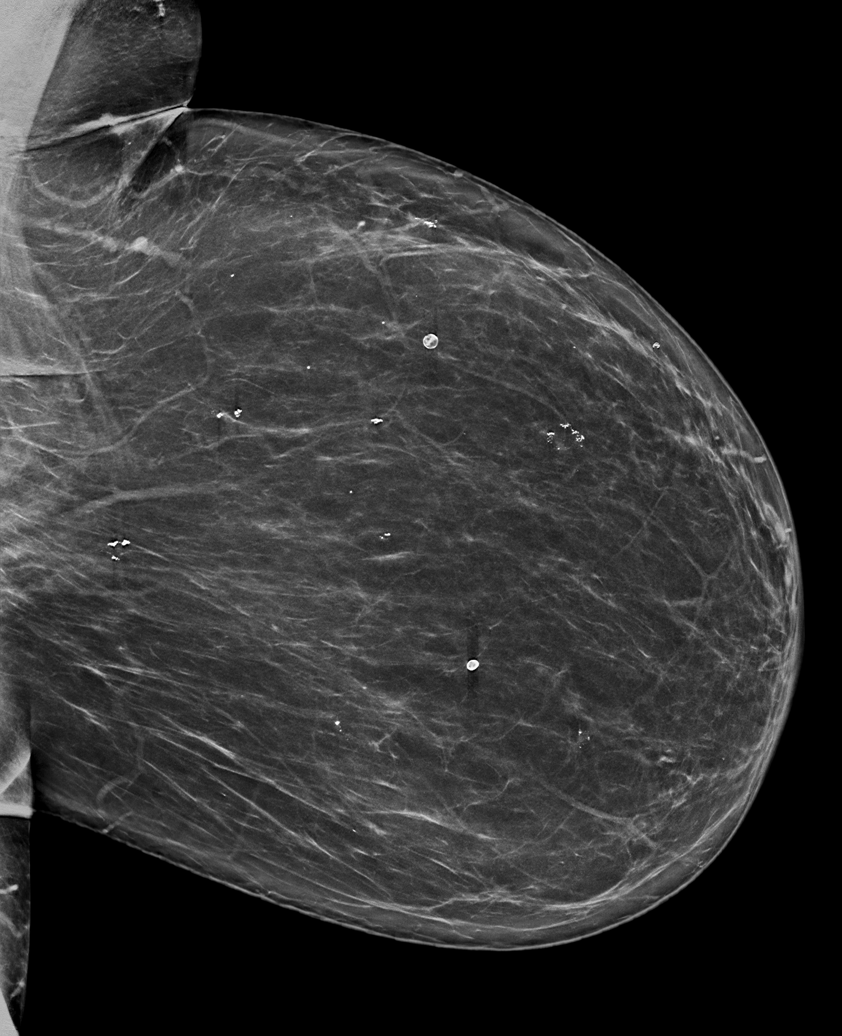

[L CC synth-2D]
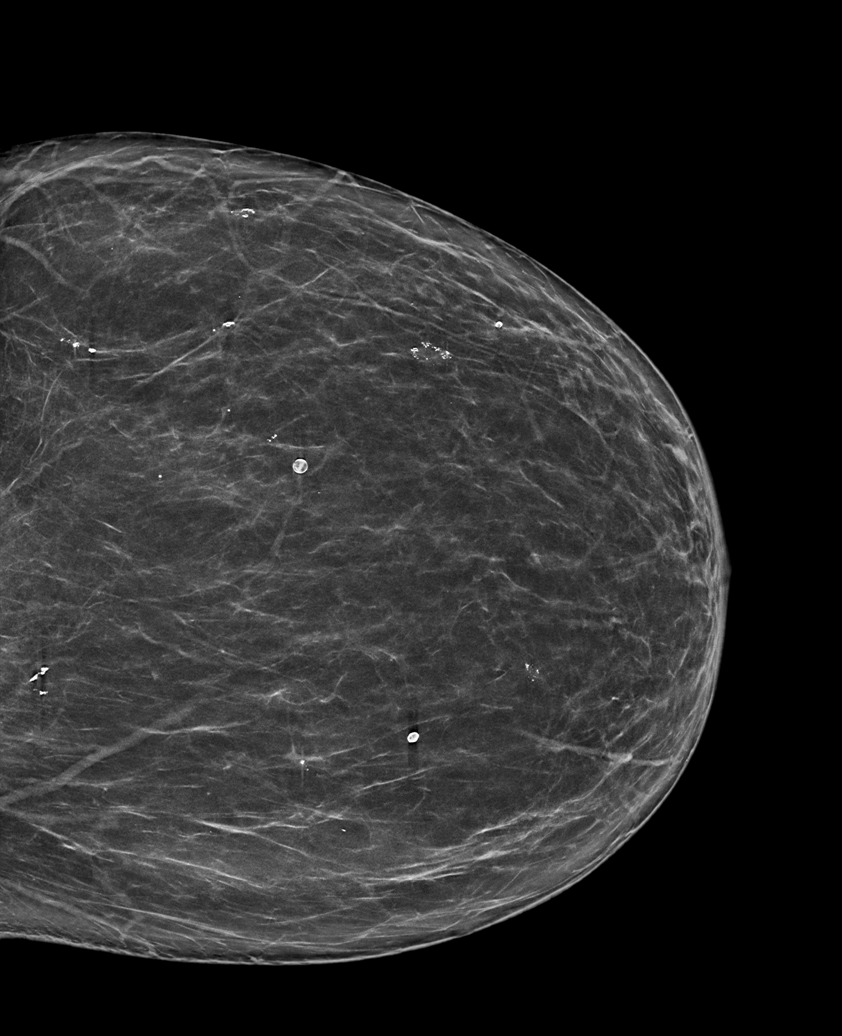

[L MLO synth-2D (2 of 2)]
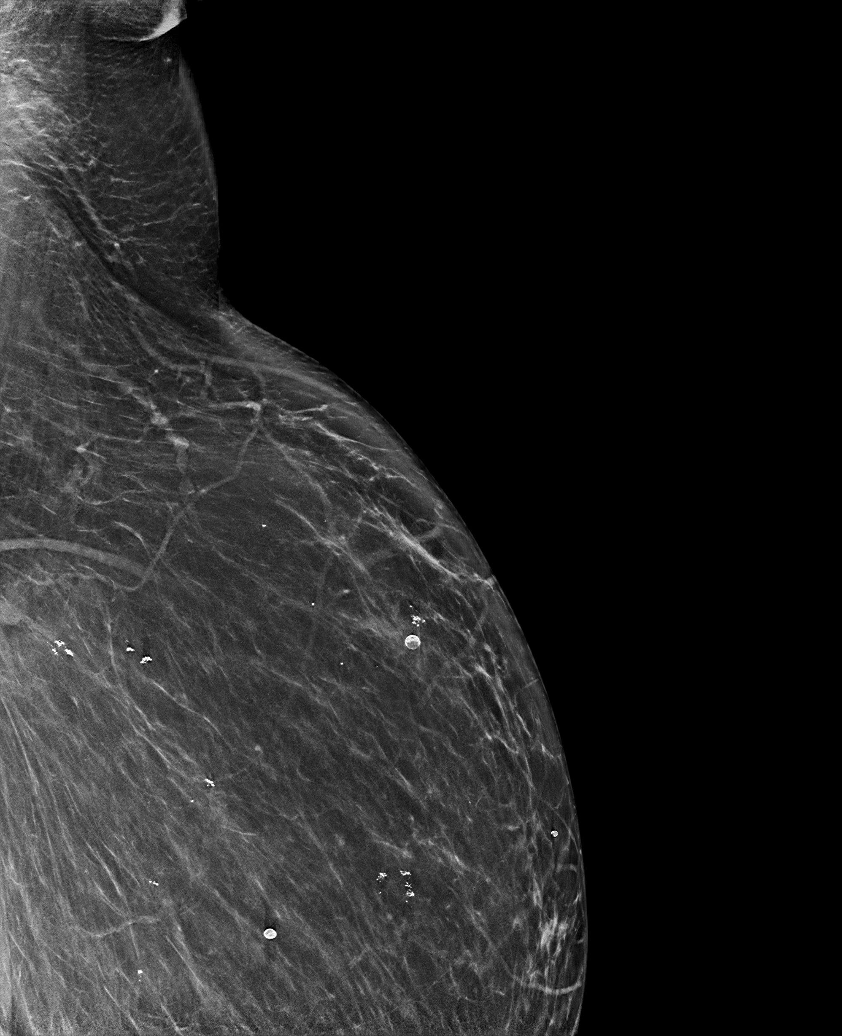

[R CC synth-2D]
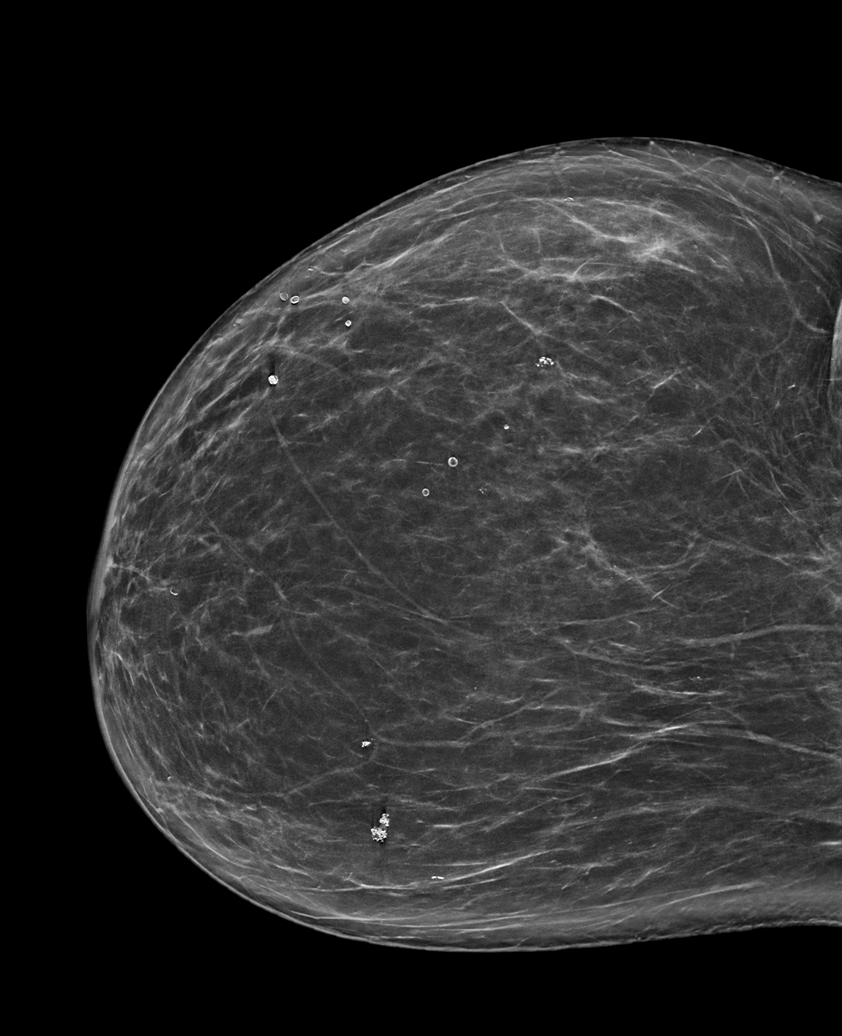

[R MLO synth-2D]
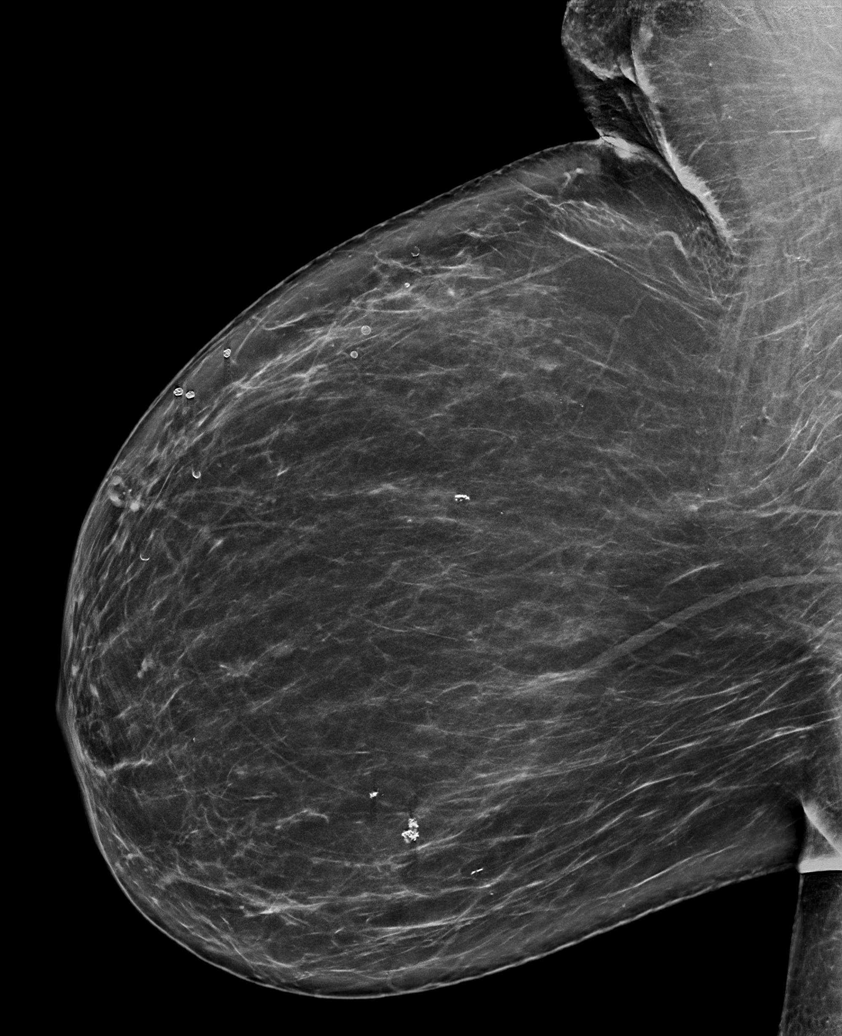

[L CC tomo · tomo slice 39/78.0]
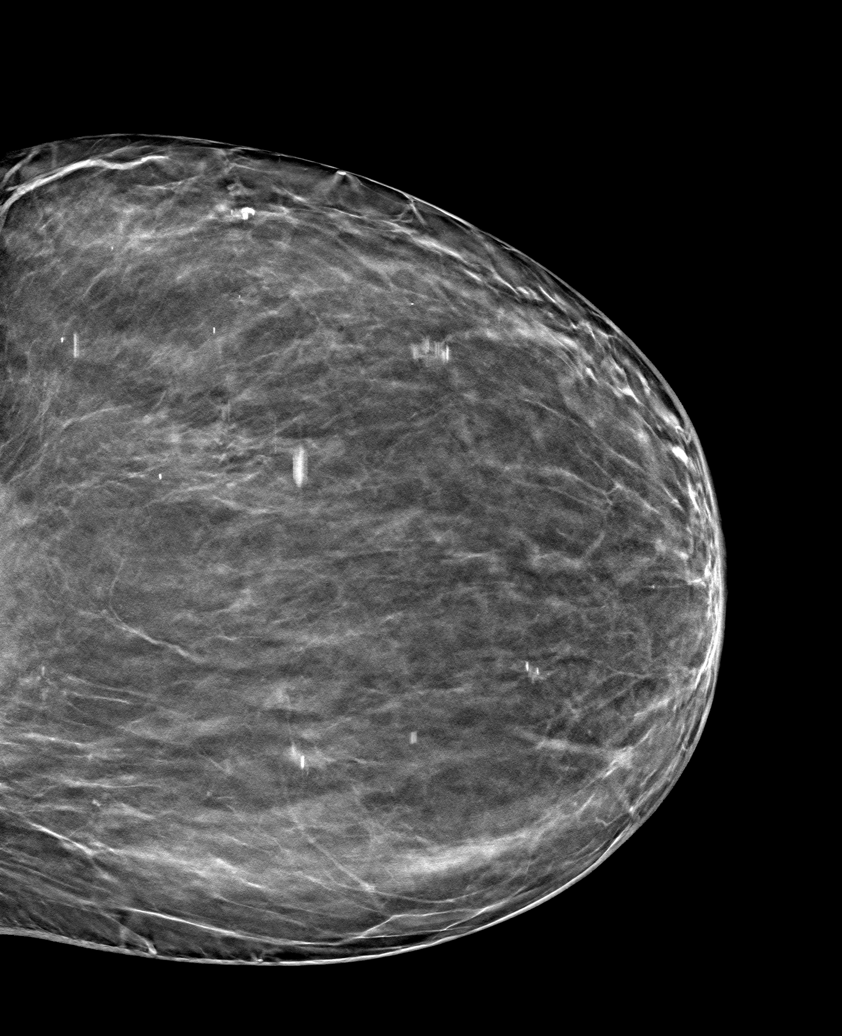

[6 of 30 positions shown; findings below may reference images not displayed]

ACR Breast Density Category b: There are scattered areas of
fibroglandular density.
FINDINGS: There are no findings suspicious for malignancy.
IMPRESSION: No mammographic evidence of malignancy. A result letter of this
screening mammogram will be mailed directly to the patient.

RECOMMENDATION:
Screening mammogram in one year. (Code:51-O-LD2)

BI-RADS CATEGORY  1: Negative.

## 2023-01-12 ENCOUNTER — Other Ambulatory Visit: Payer: Self-pay | Admitting: Internal Medicine

## 2023-01-12 ENCOUNTER — Telehealth: Payer: Self-pay | Admitting: Internal Medicine

## 2023-01-12 NOTE — Telephone Encounter (Signed)
Landon from [facility] called over and stated that they aren't able to get the LANTUS 100 UNIT/ML injection in. She was wondering if you would like something else. Please advise. Thank you!

## 2023-01-15 NOTE — Telephone Encounter (Signed)
Called and spoke to [place]. She got it figured out. No changes needed.

## 2023-01-16 ENCOUNTER — Ambulatory Visit
Admission: RE | Admit: 2023-01-16 | Discharge: 2023-01-16 | Disposition: A | Discharge status: Home / Self Care | Payer: Medicare PPO | Source: Ambulatory Visit | Attending: Rheumatology | Admitting: Rheumatology

## 2023-01-16 DIAGNOSIS — M3320 Polymyositis, organ involvement unspecified: Secondary | ICD-10-CM | POA: Diagnosis not present

## 2023-01-16 MED ORDER — METHYLPREDNISOLONE SODIUM SUCC 40 MG IJ SOLR
10.0000 mg | Freq: Once | INTRAMUSCULAR | Status: DC
  Filled 2023-01-16: qty 0.25

## 2023-01-16 MED ORDER — IMMUNE GLOBULIN (HUMAN) 10 GM/100ML IV SOLN
400.0000 mg/kg | INTRAVENOUS | Status: DC
  Administered 2023-01-16: 30 g via INTRAVENOUS
  Filled 2023-01-16 (×2): qty 300

## 2023-01-16 MED ORDER — ACETAMINOPHEN 325 MG PO TABS: 650.0000 mg | ORAL_TABLET | Freq: Once | ORAL | Status: AC

## 2023-01-16 MED ORDER — DIPHENHYDRAMINE HCL 25 MG PO CAPS: 25.0000 mg | ORAL_CAPSULE | Freq: Once | ORAL | Status: AC

## 2023-01-16 MED ORDER — ACETAMINOPHEN 325 MG PO TABS
ORAL_TABLET | ORAL | Status: AC
  Administered 2023-01-16: 650 mg via ORAL
  Filled 2023-01-16: qty 2

## 2023-01-16 MED ORDER — METHYLPREDNISOLONE SODIUM SUCC 125 MG IJ SOLR
INTRAMUSCULAR | Status: AC
  Administered 2023-01-16: 10 mg
  Filled 2023-01-16: qty 2

## 2023-01-16 MED ORDER — DIPHENHYDRAMINE HCL 25 MG PO CAPS
ORAL_CAPSULE | ORAL | Status: AC
  Administered 2023-01-16: 25 mg via ORAL
  Filled 2023-01-16: qty 1

## 2023-01-17 ENCOUNTER — Ambulatory Visit
Admission: RE | Admit: 2023-01-17 | Discharge: 2023-01-17 | Disposition: A | Discharge status: Home / Self Care | Payer: Medicare PPO | Source: Ambulatory Visit | Attending: Rheumatology | Admitting: Rheumatology

## 2023-01-17 DIAGNOSIS — M3320 Polymyositis, organ involvement unspecified: Secondary | ICD-10-CM | POA: Insufficient documentation

## 2023-01-17 MED ORDER — DIPHENHYDRAMINE HCL 25 MG PO CAPS
ORAL_CAPSULE | ORAL | Status: AC
  Administered 2023-01-17: 25 mg via ORAL
  Filled 2023-01-17: qty 1

## 2023-01-17 MED ORDER — ACETAMINOPHEN 325 MG PO TABS: 650.0000 mg | ORAL_TABLET | Freq: Once | ORAL | Status: AC

## 2023-01-17 MED ORDER — IMMUNE GLOBULIN (HUMAN) 10 GM/100ML IV SOLN
400.0000 mg/kg | Freq: Once | INTRAVENOUS | Status: AC
  Administered 2023-01-17: 30 g via INTRAVENOUS
  Filled 2023-01-17: qty 300

## 2023-01-17 MED ORDER — ACETAMINOPHEN 325 MG PO TABS
ORAL_TABLET | ORAL | Status: AC
  Administered 2023-01-17: 650 mg via ORAL
  Filled 2023-01-17: qty 2

## 2023-01-17 MED ORDER — SODIUM CHLORIDE (PF) 0.9 % IJ SOLN
INTRAMUSCULAR | Status: AC
  Filled 2023-01-17: qty 10

## 2023-01-17 MED ORDER — METHYLPREDNISOLONE SODIUM SUCC 40 MG IJ SOLR
10.0000 mg | Freq: Once | INTRAMUSCULAR | Status: AC
  Administered 2023-01-17: 10 mg via INTRAVENOUS
  Filled 2023-01-17: qty 1

## 2023-01-17 MED ORDER — DIPHENHYDRAMINE HCL 25 MG PO CAPS: 25.0000 mg | ORAL_CAPSULE | Freq: Once | ORAL | Status: AC

## 2023-01-17 MED ORDER — DEXTROSE 5 % IV SOLN: Freq: Once | INTRAVENOUS | Status: AC

## 2023-01-18 ENCOUNTER — Ambulatory Visit
Admission: RE | Admit: 2023-01-18 | Discharge: 2023-01-18 | Disposition: A | Discharge status: Home / Self Care | Payer: Medicare PPO | Source: Ambulatory Visit | Attending: Rheumatology | Admitting: Rheumatology

## 2023-01-18 DIAGNOSIS — M3320 Polymyositis, organ involvement unspecified: Secondary | ICD-10-CM | POA: Insufficient documentation

## 2023-01-18 MED ORDER — METHYLPREDNISOLONE SODIUM SUCC 125 MG IJ SOLR
INTRAMUSCULAR | Status: AC
  Filled 2023-01-18: qty 2

## 2023-01-18 MED ORDER — DIPHENHYDRAMINE HCL 25 MG PO CAPS
ORAL_CAPSULE | ORAL | Status: AC
  Filled 2023-01-18: qty 1

## 2023-01-18 MED ORDER — METHYLPREDNISOLONE SODIUM SUCC 125 MG IJ SOLR
INTRAMUSCULAR | Status: AC
  Administered 2023-01-18: 10 mg
  Filled 2023-01-18: qty 2

## 2023-01-18 MED ORDER — DIPHENHYDRAMINE HCL 25 MG PO CAPS
25.0000 mg | ORAL_CAPSULE | Freq: Once | ORAL | Status: AC
  Administered 2023-01-18: 25 mg via ORAL

## 2023-01-18 MED ORDER — METHYLPREDNISOLONE SODIUM SUCC 40 MG IJ SOLR
10.0000 mg | Freq: Once | INTRAMUSCULAR | Status: DC
  Filled 2023-01-18 (×2): qty 0.25

## 2023-01-18 MED ORDER — ACETAMINOPHEN 325 MG PO TABS
650.0000 mg | ORAL_TABLET | Freq: Once | ORAL | Status: AC
  Administered 2023-01-18: 650 mg via ORAL

## 2023-01-18 MED ORDER — ACETAMINOPHEN 325 MG PO TABS
ORAL_TABLET | ORAL | Status: AC
  Filled 2023-01-18: qty 2

## 2023-01-18 MED ORDER — IMMUNE GLOBULIN (HUMAN) 10 GM/100ML IV SOLN
400.0000 mg/kg | Freq: Once | INTRAVENOUS | Status: AC
  Administered 2023-01-18: 30 g via INTRAVENOUS
  Filled 2023-01-18 (×2): qty 300

## 2023-01-19 ENCOUNTER — Ambulatory Visit
Admission: RE | Admit: 2023-01-19 | Discharge: 2023-01-19 | Disposition: A | Discharge status: Home / Self Care | Payer: Medicare PPO | Source: Ambulatory Visit | Attending: Rheumatology | Admitting: Rheumatology

## 2023-01-19 DIAGNOSIS — M3320 Polymyositis, organ involvement unspecified: Secondary | ICD-10-CM | POA: Diagnosis not present

## 2023-01-19 MED ORDER — SODIUM CHLORIDE (PF) 0.9 % IJ SOLN
INTRAMUSCULAR | Status: AC
  Filled 2023-01-19: qty 10

## 2023-01-19 MED ORDER — ACETAMINOPHEN 325 MG PO TABS: 650.0000 mg | ORAL_TABLET | Freq: Once | ORAL | Status: AC

## 2023-01-19 MED ORDER — METHYLPREDNISOLONE SODIUM SUCC 40 MG IJ SOLR
10.0000 mg | Freq: Once | INTRAMUSCULAR | Status: AC
  Administered 2023-01-19: 10 mg via INTRAVENOUS
  Filled 2023-01-19 (×2): qty 0.25

## 2023-01-19 MED ORDER — DIPHENHYDRAMINE HCL 25 MG PO CAPS
ORAL_CAPSULE | ORAL | Status: AC
  Administered 2023-01-19: 25 mg via ORAL
  Filled 2023-01-19: qty 1

## 2023-01-19 MED ORDER — DIPHENHYDRAMINE HCL 25 MG PO CAPS: 25.0000 mg | ORAL_CAPSULE | Freq: Once | ORAL | Status: AC

## 2023-01-19 MED ORDER — IMMUNE GLOBULIN (HUMAN) 10 GM/100ML IV SOLN
400.0000 mg/kg | Freq: Once | INTRAVENOUS | Status: AC
  Administered 2023-01-19: 30 g via INTRAVENOUS
  Filled 2023-01-19: qty 300

## 2023-01-19 MED ORDER — ACETAMINOPHEN 325 MG PO TABS
ORAL_TABLET | ORAL | Status: AC
  Administered 2023-01-19: 650 mg via ORAL
  Filled 2023-01-19: qty 2

## 2023-02-08 ENCOUNTER — Encounter: Payer: Self-pay | Admitting: Internal Medicine

## 2023-02-08 ENCOUNTER — Ambulatory Visit: Payer: Medicare PPO | Admitting: Internal Medicine

## 2023-02-08 VITALS — BP 138/80 | HR 107 | Temp 96.9°F | Ht 65.5 in | Wt 197.0 lb

## 2023-02-08 DIAGNOSIS — J069 Acute upper respiratory infection, unspecified: Secondary | ICD-10-CM | POA: Diagnosis not present

## 2023-02-08 NOTE — Progress Notes (Signed)
Subjective:    Patient ID: Bridget Romero, female    DOB: [birth year], 77 y.o.   MRN: [id number]  HPI Here due to respiratory illness  Started 4 days ago Flu like symptoms Hoarse, eyes and nose running---tried allergy medication (cetirizine) Cough--dry Sore throat May be some better No fever, chills or sweats No ear pain or headache  No SOB  Tried robitussin--some help  Current Outpatient Medications on File Prior to Visit  Medication Sig Dispense Refill   Accu-Chek FastClix Lancets MISC USE TO TEST BLOOD SUGAR TWICE DAILY DX E11.40 102 each 11   ACCU-CHEK SMARTVIEW test strip USE AS DIRECTED TO CHECK BLOOD SUGAR TWICE DAILY 100 each 6   acetaminophen (TYLENOL) 500 MG tablet Take 500 mg by mouth every 6 (six) hours as needed.     Alcohol Swabs (B-D SINGLE USE SWABS REGULAR) PADS Use to test blood sugar once daily dx: 250.00 100 each 3   BD SYRINGE SLIP TIP 25G X 5/8" 1 ML MISC      Blood Glucose Calibration (ACCU-CHEK AVIVA) SOLN Use as directed 1 each 3   Cholecalciferol (VITAMIN D) 1000 UNITS capsule Take 1,000 Units by mouth daily.     cyclobenzaprine (FLEXERIL) 5 MG tablet Take 5 mg by mouth 2 (two) times daily.     ferrous sulfate 325 (65 FE) MG tablet Take 325 mg by mouth daily with breakfast.     folic acid (FOLVITE) 1 MG tablet Take 1 mg by mouth daily.     gabapentin (NEURONTIN) 300 MG capsule Take 1 capsule by mouth 3 (three) times daily.     Insulin Syringe-Needle U-100 31G X 5/16" 1 ML MISC by Does not apply route.     LANTUS 100 UNIT/ML injection INJECT 10 TO 20 UNITS INTO THE SKIN DAILY AS DIRECTED 10 mL 3   lisinopril (ZESTRIL) 10 MG tablet TAKE 1 TABLET BY MOUTH ONCE A DAY 90 tablet 3   magnesium citrate SOLN Take 148 mLs by mouth once a week.     metFORMIN (GLUCOPHAGE) 1000 MG tablet TAKE 1 TABLET BY MOUTH TWICE A DAY 180 tablet 3   methotrexate 250 MG/10ML injection Inject into the skin.      Multiple Vitamin (MULTIVITAMIN) tablet Take 1 tablet by mouth  daily.     predniSONE (DELTASONE) 2.5 MG tablet Take 2.5 mg by mouth daily.     trospium (SANCTURA) 20 MG tablet Take 1 tab 1 hour prior to bedtime 30 tablet 11   No current facility-administered medications on file prior to visit.    Allergies  Allergen Reactions   Atorvastatin Other (See Comments)    myalgias   Pravastatin Other (See Comments)    Muscle pain    Past Medical History:  Diagnosis Date   Allergy    Asthma    Diabetes mellitus    Hyperlipidemia    Hypertension    Neuromuscular disorder ([place])    polymyositis   Osteoarthritis    Vitamin B12 deficiency     Past Surgical History:  Procedure Laterality Date   COLONOSCOPY WITH PROPOFOL N/A 03/07/2016   Procedure: COLONOSCOPY WITH PROPOFOL;  Surgeon: Lollie Sails, MD;  Location: [facility] ENDOSCOPY;  Service: Endoscopy;  Laterality: N/A;    Family History  Problem Relation Age of Onset   Heart disease Mother    Cancer Father    Breast cancer Neg Hx     Social History   Socioeconomic History   Marital status: Widowed  Spouse name: Not on file   Number of children: 2   Years of education: Not on file   Highest education level: Not on file  Occupational History   Occupation: Retired as Training and development officer at [employer]:    Tobacco Use   Smoking status: Former    Passive exposure: Never   Smokeless tobacco: Never   Tobacco comments:    age 33 when stopped smoking  Vaping Use   Vaping Use: Never used  Substance and Sexual Activity   Alcohol use: No   Drug use: No   Sexual activity: Not on file  Other Topics Concern   Not on file  Social History Narrative   has living will   Son and daughter should make health care decisions for her   Would accept resuscitation   Not sure about tube feeds   Social Determinants of Health   Financial Resource Strain: Not on file  Food Insecurity: Not on file  Transportation Needs: Not on file  Physical Activity: Not on file  Stress: Not on  file  Social Connections: Not on file  Intimate Partner Violence: Not on file   Review of Systems No loss of smell or taste COVID test negative No N/V Eating okay    Objective:   Physical Exam Constitutional:      Appearance: Normal appearance.  HENT:     Head:     Comments: No sinus tenderness    Nose:     Comments: Mild swelling    Mouth/Throat:     Pharynx: No oropharyngeal exudate or posterior oropharyngeal erythema.  Pulmonary:     Effort: Pulmonary effort is normal.     Breath sounds: Normal breath sounds. No wheezing or rales.  Musculoskeletal:     Cervical back: Neck supple.  Lymphadenopathy:     Cervical: No cervical adenopathy.  Neurological:     Mental Status: She is alert.            Assessment & Plan:

## 2023-02-08 NOTE — Assessment & Plan Note (Signed)
Seems to be self limited viral infection Continue robitussin and tylenol If worsens, contact on Monday to consider empiric antibiotic

## 2023-02-12 ENCOUNTER — Telehealth: Payer: Self-pay | Admitting: Internal Medicine

## 2023-02-12 MED ORDER — AMOXICILLIN 500 MG PO TABS: 1000.0000 mg | ORAL_TABLET | Freq: Two times a day (BID) | ORAL | 0 refills | Status: AC

## 2023-02-12 NOTE — Telephone Encounter (Signed)
Called patient and reviewed all information. Patient verbalized understanding. Will call if any further questions.

## 2023-02-12 NOTE — Telephone Encounter (Signed)
Pt called in requesting a call back . Stated PCP advise her to call in if her allergy symptom persist for medication . Please advise # [phone]

## 2023-02-12 NOTE — Telephone Encounter (Signed)
Unable to reach patient. Unable to leave voicemail.  

## 2023-02-12 NOTE — Telephone Encounter (Signed)
Called and spoke with patient she states she is feeling okay, but still struggling with sneezing, eyes are watery, slight sore throat, and cough. She is requesting medication be sent in to the pharmacy. Please advise.    [phone]

## 2023-02-16 ENCOUNTER — Encounter: Payer: Self-pay | Admitting: Nurse Practitioner

## 2023-02-16 ENCOUNTER — Ambulatory Visit: Payer: Medicare PPO | Admitting: Nurse Practitioner

## 2023-02-16 VITALS — BP 130/76 | HR 97 | Temp 97.7°F | Resp 16 | Ht 65.5 in | Wt 196.5 lb

## 2023-02-16 DIAGNOSIS — R051 Acute cough: Secondary | ICD-10-CM | POA: Insufficient documentation

## 2023-02-16 DIAGNOSIS — U071 COVID-19: Secondary | ICD-10-CM | POA: Insufficient documentation

## 2023-02-16 LAB — POC COVID19 BINAXNOW: SARS Coronavirus 2 Ag: POSITIVE — AB

## 2023-02-16 MED ORDER — NIRMATRELVIR/RITONAVIR (PAXLOVID)TABLET: 3.0000 | ORAL_TABLET | Freq: Two times a day (BID) | ORAL | 0 refills | Status: AC

## 2023-02-16 NOTE — Patient Instructions (Signed)
Nice to see you today Quarantine throught 02/19/2023. If you are fever free for 24 hours without the use of fever reducing medications and are improving you can go out on 02/20/2023 but need to wear a mask around others through 02/23/2023. Follow up if no improvement

## 2023-02-16 NOTE — Assessment & Plan Note (Signed)
COVID test in office.

## 2023-02-16 NOTE — Progress Notes (Signed)
Acute Office Visit  Subjective:     Patient ID: Bridget Romero, female    DOB: 14-Jul-1946, 77 y.o.   MRN: [id number]  Chief Complaint  Patient presents with   Cough   Eye Drainage    Runny clear tears.     Patient is in today for sick symptoms with a history of HTN, allergic asthma, controlled type 2 diabetes.  Symptoms started approx 2 weeks ago. Was seen by Dr Silvio Pate but was improving. States that approx 2 days ago a change in symptoms  At home test that was positive  COVID-vaccine: Pfizer x 2 and at least 1 booster Flu vaccine: Up-to-date 10/27/2022 No sick contacts States that using mucinex that has helped some.   Review of Systems  Constitutional:  Positive for malaise/fatigue. Negative for chills and fever.       Appetitie was good   Fluid intake is good   HENT:  Negative for ear discharge, ear pain, sinus pain and sore throat.   Respiratory:  Positive for cough and sputum production. Negative for shortness of breath.   Gastrointestinal:  Negative for abdominal pain, diarrhea, nausea and vomiting.  Musculoskeletal:  Negative for joint pain and myalgias.  Neurological:  Negative for headaches.        Objective:    BP 130/76   Pulse 97   Temp 97.7 F (36.5 C)   Resp 16   Ht 5' 5.5" (1.664 m)   Wt 196 lb 8 oz (89.1 kg)   SpO2 99%   BMI 32.20 kg/m    Physical Exam Vitals and nursing note reviewed.  Constitutional:      Appearance: Normal appearance.  HENT:     Right Ear: Tympanic membrane, ear canal and external ear normal.     Left Ear: Tympanic membrane, ear canal and external ear normal.     Mouth/Throat:     Mouth: Mucous membranes are moist.     Pharynx: Oropharynx is clear. No posterior oropharyngeal erythema.  Cardiovascular:     Rate and Rhythm: Normal rate and regular rhythm.     Heart sounds: Normal heart sounds.  Pulmonary:     Effort: Pulmonary effort is normal.     Breath sounds: Normal breath sounds.  Abdominal:     General:  Bowel sounds are normal.  Neurological:     Mental Status: She is alert.     Results for orders placed or performed in visit on 02/16/23  POC COVID-19 BinaxNow  Result Value Ref Range   SARS Coronavirus 2 Ag Positive (A) Negative        Assessment & Plan:   Problem List Items Addressed This Visit       Other   COVID-19    Patient positive for COVID-19 in office.  Given patient's age comorbidities and medication use will like to treat with antiviral.  Did discuss that antivirals are EUA only.  After discussion did elect to choose Paxlovid for treatment.  Did discuss common side effects of Paxlovid use.  Did discuss CDC guidelines/recommendations in regards to self-isolation/quarantine.  Did discuss signs and symptoms as when to seek urgent emergent healthcare.  Follow-up if no improvement      Relevant Medications   nirmatrelvir/ritonavir (PAXLOVID) 20 x 150 MG & 10 x '100MG'$  TABS   Acute cough - Primary    COVID test in office.      Relevant Orders   POC COVID-19 BinaxNow (Completed)    Meds ordered this encounter  Medications   nirmatrelvir/ritonavir (PAXLOVID) 20 x 150 MG & 10 x '100MG'$  TABS    Sig: Take 3 tablets by mouth 2 (two) times daily for 5 days. (Take nirmatrelvir 150 mg two tablets twice daily for 5 days and ritonavir 100 mg one tablet twice daily for 5 days) Patient GFR is >60    Dispense:  30 tablet    Refill:  0    Order Specific Question:   Supervising Provider    Answer:   Loura Pardon A [[id number]]    Return if symptoms worsen or fail to improve.  Romilda Garret, NP

## 2023-02-16 NOTE — Assessment & Plan Note (Signed)
Patient positive for COVID-19 in office.  Given patient's age comorbidities and medication use will like to treat with antiviral.  Did discuss that antivirals are EUA only.  After discussion did elect to choose Paxlovid for treatment.  Did discuss common side effects of Paxlovid use.  Did discuss CDC guidelines/recommendations in regards to self-isolation/quarantine.  Did discuss signs and symptoms as when to seek urgent emergent healthcare.  Follow-up if no improvement

## 2023-03-01 MED FILL — Iron Sucrose Inj 20 MG/ML (Fe Equiv): INTRAVENOUS | Qty: 10 | Status: AC

## 2023-03-02 ENCOUNTER — Inpatient Hospital Stay: Payer: Medicare PPO | Admitting: Internal Medicine

## 2023-03-02 ENCOUNTER — Inpatient Hospital Stay: Payer: Medicare PPO

## 2023-03-02 ENCOUNTER — Encounter: Payer: Self-pay | Admitting: Internal Medicine

## 2023-03-02 ENCOUNTER — Inpatient Hospital Stay: Payer: Medicare PPO | Attending: Internal Medicine

## 2023-03-02 VITALS — BP 147/80 | HR 87 | Temp 96.3°F | Resp 17 | Wt 199.0 lb

## 2023-03-02 DIAGNOSIS — M3320 Polymyositis, organ involvement unspecified: Secondary | ICD-10-CM | POA: Insufficient documentation

## 2023-03-02 DIAGNOSIS — Z8616 Personal history of COVID-19: Secondary | ICD-10-CM | POA: Diagnosis not present

## 2023-03-02 DIAGNOSIS — Z809 Family history of malignant neoplasm, unspecified: Secondary | ICD-10-CM | POA: Insufficient documentation

## 2023-03-02 DIAGNOSIS — Z87891 Personal history of nicotine dependence: Secondary | ICD-10-CM | POA: Insufficient documentation

## 2023-03-02 DIAGNOSIS — D649 Anemia, unspecified: Secondary | ICD-10-CM | POA: Insufficient documentation

## 2023-03-02 LAB — CBC WITH DIFFERENTIAL/PLATELET
Abs Immature Granulocytes: 0.02 10*3/uL (ref 0.00–0.07)
Basophils Absolute: 0 10*3/uL (ref 0.0–0.1)
Basophils Relative: 1 %
Eosinophils Absolute: 0.2 10*3/uL (ref 0.0–0.5)
Eosinophils Relative: 2 %
HCT: 27.5 % — ABNORMAL LOW (ref 36.0–46.0)
Hemoglobin: 8.5 g/dL — ABNORMAL LOW (ref 12.0–15.0)
Immature Granulocytes: 0 %
Lymphocytes Relative: 15 %
Lymphs Abs: 1.1 10*3/uL (ref 0.7–4.0)
MCH: 29 pg (ref 26.0–34.0)
MCHC: 30.9 g/dL (ref 30.0–36.0)
MCV: 93.9 fL (ref 80.0–100.0)
Monocytes Absolute: 0.3 10*3/uL (ref 0.1–1.0)
Monocytes Relative: 5 %
Neutro Abs: 5.3 10*3/uL (ref 1.7–7.7)
Neutrophils Relative %: 77 %
Platelets: 321 10*3/uL (ref 150–400)
RBC: 2.93 MIL/uL — ABNORMAL LOW (ref 3.87–5.11)
RDW: 14.1 % (ref 11.5–15.5)
WBC: 6.9 10*3/uL (ref 4.0–10.5)
nRBC: 0 % (ref 0.0–0.2)

## 2023-03-02 LAB — BASIC METABOLIC PANEL
Anion gap: 6 (ref 5–15)
BUN: 20 mg/dL (ref 8–23)
CO2: 24 mmol/L (ref 22–32)
Calcium: 9.3 mg/dL (ref 8.9–10.3)
Chloride: 108 mmol/L (ref 98–111)
Creatinine, Ser: 0.77 mg/dL (ref 0.44–1.00)
GFR, Estimated: >60 mL/min (ref >60)
Glucose, Bld: 173 mg/dL — ABNORMAL HIGH (ref 70–99)
Potassium: 4.6 mmol/L (ref 3.5–5.1)
Sodium: 138 mmol/L (ref 135–145)

## 2023-03-02 LAB — LACTATE DEHYDROGENASE: LDH: 194 U/L — ABNORMAL HIGH (ref 98–192)

## 2023-03-02 LAB — IRON AND TIBC
Iron: 55 ug/dL (ref 28–170)
Saturation Ratios: 22 % (ref 10.4–31.8)
TIBC: 251 ug/dL (ref 250–450)
UIBC: 196 ug/dL

## 2023-03-02 LAB — C-REACTIVE PROTEIN: CRP: 0.6 mg/dL (ref <1.0)

## 2023-03-02 LAB — FERRITIN: Ferritin: 110 ng/mL (ref 11–307)

## 2023-03-02 NOTE — Progress Notes (Signed)
Would like you to evaluate her cough, she did have COVID two weeks ago.

## 2023-03-02 NOTE — Progress Notes (Signed)
[facility] NOTE  Patient Care Team: [name], MD as PCP - General (Internal Medicine) Emmaline Kluver., MD (Rheumatology) Cammie Sickle, MD as Consulting Physician (Internal Medicine)  CHIEF COMPLAINTS/PURPOSE OF CONSULTATION: Polymyositis/; Anemia  #Polymyositis [Dr. [name]; 2019; left arm biopsy]; low dose prednisone/methotrexate/IVIG infusions 400 mg kilogram daily x4 days every 6 weeks [facility]; Dr.Patel] Oncology History   No history exists.    HISTORY OF PRESENTING ILLNESS: Alone.  Walks independently.  Bridget Romero 77 y.o.  female with a history of polymyositis currently on IVIG [as per Rheumatology] is here for follow-up of anemia.  Patient diagnosed with COVID approximate 2 weeks ago.  Continues to have intermittent cough.  She denies any blood in stools or black-colored stools.  Denies any blood in urine.  Patient denies any worsening weakness in arms or legs.  No falls.  No headaches.  No nausea no vomiting no fevers or chills.  Review of Systems  Constitutional:  Positive for malaise/fatigue. Negative for chills, diaphoresis, fever and weight loss.  HENT:  Negative for nosebleeds and sore throat.   Eyes:  Negative for double vision.  Respiratory:  Negative for cough, hemoptysis, sputum production, shortness of breath and wheezing.   Cardiovascular:  Negative for chest pain, palpitations, orthopnea and leg swelling.  Gastrointestinal:  Negative for abdominal pain, blood in stool, constipation, diarrhea, heartburn, melena, nausea and vomiting.  Genitourinary:  Negative for dysuria, frequency and urgency.  Musculoskeletal:  Negative for back pain and joint pain.  Skin: Negative.  Negative for itching and rash.  Neurological:  Negative for dizziness, tingling, focal weakness, weakness and headaches.  Endo/Heme/Allergies:  Does not bruise/bleed easily.  Psychiatric/Behavioral:  Negative for depression. The patient  is not nervous/anxious and does not have insomnia.      MEDICAL HISTORY:  Past Medical History:  Diagnosis Date   Allergy    Asthma    Diabetes mellitus    Hyperlipidemia    Hypertension    Neuromuscular disorder ([place])    polymyositis   Osteoarthritis    Vitamin B12 deficiency     SURGICAL HISTORY: Past Surgical History:  Procedure Laterality Date   COLONOSCOPY WITH PROPOFOL N/A 03/07/2016   Procedure: COLONOSCOPY WITH PROPOFOL;  Surgeon: Lollie Sails, MD;  Location: [facility] ENDOSCOPY;  Service: Endoscopy;  Laterality: N/A;    SOCIAL HISTORY: Social History   Socioeconomic History   Marital status: Widowed    Spouse name: Not on file   Number of children: 2   Years of education: Not on file   Highest education level: Not on file  Occupational History   Occupation: Retired as Training and development officer at [employer]    Comment:    Tobacco Use   Smoking status: Former    Passive exposure: Never   Smokeless tobacco: Never   Tobacco comments:    age 37 when stopped smoking  Vaping Use   Vaping Use: Never used  Substance and Sexual Activity   Alcohol use: No   Drug use: No   Sexual activity: Not on file  Other Topics Concern   Not on file  Social History Narrative   has living will   Son and daughter should make health care decisions for her   Would accept resuscitation   Not sure about tube feeds   Social Determinants of Health   Financial Resource Strain: Not on file  Food Insecurity: Not on file  Transportation Needs: Not on file  Physical  Activity: Not on file  Stress: Not on file  Social Connections: Not on file  Intimate Partner Violence: Not on file    FAMILY HISTORY: Family History  Problem Relation Age of Onset   Heart disease Mother    Cancer Father    Breast cancer Neg Hx     ALLERGIES:  is allergic to atorvastatin and pravastatin.  MEDICATIONS:  Current Outpatient Medications  Medication Sig Dispense Refill   Accu-Chek FastClix  Lancets MISC USE TO TEST BLOOD SUGAR TWICE DAILY DX E11.40 102 each 11   ACCU-CHEK SMARTVIEW test strip USE AS DIRECTED TO CHECK BLOOD SUGAR TWICE DAILY 100 each 6   acetaminophen (TYLENOL) 500 MG tablet Take 500 mg by mouth every 6 (six) hours as needed.     Alcohol Swabs (B-D SINGLE USE SWABS REGULAR) PADS Use to test blood sugar once daily dx: 250.00 100 each 3   BD SYRINGE SLIP TIP 25G X 5/8" 1 ML MISC      Blood Glucose Calibration (ACCU-CHEK AVIVA) SOLN Use as directed 1 each 3   Cholecalciferol (VITAMIN D) 1000 UNITS capsule Take 1,000 Units by mouth daily.     cyclobenzaprine (FLEXERIL) 5 MG tablet Take 5 mg by mouth 2 (two) times daily.     ferrous sulfate 325 (65 FE) MG tablet Take 325 mg by mouth daily with breakfast.     folic acid (FOLVITE) 1 MG tablet Take 1 mg by mouth daily.     gabapentin (NEURONTIN) 300 MG capsule Take 1 capsule by mouth 3 (three) times daily.     Insulin Syringe-Needle U-100 31G X 5/16" 1 ML MISC by Does not apply route.     LANTUS 100 UNIT/ML injection INJECT 10 TO 20 UNITS INTO THE SKIN DAILY AS DIRECTED 10 mL 3   lisinopril (ZESTRIL) 10 MG tablet TAKE 1 TABLET BY MOUTH ONCE A DAY 90 tablet 3   magnesium citrate SOLN Take 148 mLs by mouth once a week.     metFORMIN (GLUCOPHAGE) 1000 MG tablet TAKE 1 TABLET BY MOUTH TWICE A DAY 180 tablet 3   methotrexate 250 MG/10ML injection Inject into the skin.      Multiple Vitamin (MULTIVITAMIN) tablet Take 1 tablet by mouth daily.     predniSONE (DELTASONE) 2.5 MG tablet Take 2.5 mg by mouth daily.     trospium (SANCTURA) 20 MG tablet Take 1 tab 1 hour prior to bedtime 30 tablet 11   No current facility-administered medications for this visit.      Marland Kitchen  PHYSICAL EXAMINATION: ECOG PERFORMANCE STATUS: 0 - Asymptomatic  Vitals:   03/02/23 1315  BP: (!) 147/80  Pulse: 87  Resp: 17  Temp: (!) 96.3 F (35.7 C)  SpO2: 100%   Filed Weights   03/02/23 1315  Weight: 199 lb (90.3 kg)    Physical Exam Vitals  and nursing note reviewed.  Constitutional:      Comments:    HENT:     Head: Normocephalic and atraumatic.     Mouth/Throat:     Pharynx: Oropharynx is clear.  Eyes:     Extraocular Movements: Extraocular movements intact.     Pupils: Pupils are equal, round, and reactive to light.  Cardiovascular:     Rate and Rhythm: Normal rate and regular rhythm.  Pulmonary:     Comments: Decreased breath sounds bilaterally.  Abdominal:     Palpations: Abdomen is soft.  Musculoskeletal:        General: Normal range of motion.  Cervical back: Normal range of motion.  Skin:    General: Skin is warm.  Neurological:     General: No focal deficit present.     Mental Status: She is alert and oriented to person, place, and time.  Psychiatric:        Behavior: Behavior normal.        Judgment: Judgment normal.     LABORATORY DATA:  I have reviewed the data as listed Lab Results  Component Value Date   WBC 6.9 03/02/2023   HGB 8.5 (L) 03/02/2023   HCT 27.5 (L) 03/02/2023   MCV 93.9 03/02/2023   PLT 321 03/02/2023   Recent Labs    08/01/22 0922 11/01/22 1350 11/21/22 0921 03/02/23 1300  NA 141 137 141 138  K 3.8 4.1 3.7 4.6  CL 111 105 108 108  CO2 '23 24 27 24  '$ GLUCOSE 166* 160* 160* 173*  BUN 24* '21 21 20  '$ CREATININE 0.78 0.87 0.80 0.77  CALCIUM 9.4 9.3 9.5 9.3  GFRNONAA >60 >60 >60 >60  PROT 7.1 7.3 7.0  --   ALBUMIN 3.7 3.6 3.8  --   AST 31 33 37  --   ALT '20 30 29  '$ --   ALKPHOS 64 52 57  --   BILITOT 0.7 0.6 0.7  --     RADIOGRAPHIC STUDIES: I have personally reviewed the radiological images as listed and agreed with the findings in the report. No results found.  ASSESSMENT & PLAN:   Symptomatic anemia # Normocytic anemia: chronic Hb ~9-10. JULY 2023-- iron sat -24%; ferritin-69. continue PO iron. #Recommend continue gentle iron 1 pill a day. Await iron studies/ferritin today.   # However today Hb 8.5- ? Worse- Unclear etiology.  Question related to  polymyositis Vs.  recent Covid. will proceed  further work-up including myeloma panel; Discussed re: possible bone marrow biopsy-if hemoglobin does not improve or gets worse.  No obvious concern for hemolysis.  # Polymyositis [Dr. [name]; Clinically, she is doing well on IVIG 2 g/kg every 6 weeks [Short stay; per Rheumatology]; also prednisone 2.5 mg a day and MTX 10 mg SQ weekly-  Dr.Patel; Rheum  # COVID s/p Paxlovid- [feb [id number]- cough improved- monitor for now.   # Disposition:  # no venofer today # follow up in 2 months- MD; cbc/bmp; LDH; Multiple myeloma panel; Kappa lamda chains; [id number]; folic acid; possible venofer- Dr.B.  Cc; Dr.Kernodle.   All questions were answered. The patient knows to call the clinic with any problems, questions or concerns.    Cammie Sickle, MD 03/02/2023 1:57 PM

## 2023-03-02 NOTE — Assessment & Plan Note (Addendum)
#   Normocytic anemia: chronic Hb ~9-10. JULY 2023-- iron sat -24%; ferritin-69. continue PO iron. #Recommend continue gentle iron 1 pill a day. Await iron studies/ferritin today.   # However today Hb 8.5- ? Worse- Unclear etiology.  Question related to polymyositis Vs.  recent Covid. will proceed  further work-up including myeloma panel; Discussed re: possible bone marrow biopsy-if hemoglobin does not improve or gets worse.  No obvious concern for hemolysis.  # Polymyositis [Dr. [name]; Clinically, she is doing well on IVIG 2 g/kg every 6 weeks [Short stay; per Rheumatology]; also prednisone 2.5 mg a day and MTX 10 mg SQ weekly-  Dr.Patel; Rheum  # COVID s/p Paxlovid- [feb 4967]- cough improved- monitor for now.   # Disposition:  # no venofer today # follow up in 2 months- MD; cbc/bmp; LDH; Multiple myeloma panel; Kappa lamda chains; R91; folic acid; possible venofer- Dr.B.  Cc; Dr.Kernodle.

## 2023-03-06 ENCOUNTER — Telehealth: Payer: Self-pay | Admitting: Internal Medicine

## 2023-03-06 MED ORDER — NYSTATIN 100000 UNIT/GM EX POWD: 1.0000 | Freq: Three times a day (TID) | CUTANEOUS | 2 refills | Status: DC

## 2023-03-06 NOTE — Telephone Encounter (Signed)
Let her know I sent the prescription but some insurances have been denying it (not sure why). Can check for alternatives with the pharmacist if this happens

## 2023-03-06 NOTE — Telephone Encounter (Signed)
Tried to call pt. No answer and VM to leave a message.

## 2023-03-06 NOTE — Telephone Encounter (Signed)
Pt called in requesting a refill on Nystatin power be sent to pharmacy . RX is no longer on her med list . Please advise [phone]

## 2023-03-07 NOTE — Telephone Encounter (Signed)
Pt called back returning Shannon's call. Requested call back @ [phone]

## 2023-03-07 NOTE — Telephone Encounter (Signed)
Tried to call pt back. No answer and no vm to leave a message. If pt calls back, please pass on Dr Alla German note. Thank you.

## 2023-03-13 ENCOUNTER — Ambulatory Visit
Admission: RE | Admit: 2023-03-13 | Discharge: 2023-03-13 | Disposition: A | Discharge status: Home / Self Care | Payer: Medicare PPO | Source: Ambulatory Visit | Attending: Rheumatology | Admitting: Rheumatology

## 2023-03-13 DIAGNOSIS — M3322 Polymyositis with myopathy: Secondary | ICD-10-CM | POA: Insufficient documentation

## 2023-03-13 MED ORDER — IMMUNE GLOBULIN (HUMAN) 10 GM/100ML IV SOLN
400.0000 mg/kg | INTRAVENOUS | Status: DC
  Filled 2023-03-13 (×2): qty 350

## 2023-03-13 MED ORDER — ACETAMINOPHEN 325 MG PO TABS
ORAL_TABLET | ORAL | Status: AC
  Administered 2023-03-13: 650 mg via ORAL
  Filled 2023-03-13: qty 2

## 2023-03-13 MED ORDER — METHYLPREDNISOLONE SODIUM SUCC 40 MG IJ SOLR: 10.0000 mg | Freq: Once | INTRAMUSCULAR | Status: AC

## 2023-03-13 MED ORDER — METHYLPREDNISOLONE SODIUM SUCC 40 MG IJ SOLR
INTRAMUSCULAR | Status: AC
  Administered 2023-03-13: 10 mg via INTRAVENOUS
  Filled 2023-03-13: qty 1

## 2023-03-13 MED ORDER — ACETAMINOPHEN 325 MG PO TABS: 650.0000 mg | ORAL_TABLET | Freq: Four times a day (QID) | ORAL | Status: DC | PRN

## 2023-03-13 MED ORDER — DIPHENHYDRAMINE HCL 25 MG PO CAPS: 25.0000 mg | ORAL_CAPSULE | Freq: Once | ORAL | Status: AC

## 2023-03-13 MED ORDER — IMMUNE GLOBULIN (HUMAN) 10 GM/100ML IV SOLN
400.0000 mg/kg | INTRAVENOUS | Status: DC
  Administered 2023-03-13: 35 g via INTRAVENOUS
  Filled 2023-03-13 (×2): qty 350

## 2023-03-13 MED ORDER — DIPHENHYDRAMINE HCL 25 MG PO CAPS
ORAL_CAPSULE | ORAL | Status: AC
  Administered 2023-03-13: 25 mg via ORAL
  Filled 2023-03-13: qty 1

## 2023-03-14 ENCOUNTER — Ambulatory Visit
Admission: RE | Admit: 2023-03-14 | Discharge: 2023-03-14 | Disposition: A | Discharge status: Home / Self Care | Payer: Medicare PPO | Source: Ambulatory Visit | Attending: Rheumatology | Admitting: Rheumatology

## 2023-03-14 DIAGNOSIS — M3320 Polymyositis, organ involvement unspecified: Secondary | ICD-10-CM | POA: Insufficient documentation

## 2023-03-14 MED ORDER — ACETAMINOPHEN 325 MG PO TABS
ORAL_TABLET | ORAL | Status: AC
  Filled 2023-03-14: qty 2

## 2023-03-14 MED ORDER — ACETAMINOPHEN 500 MG PO TABS
ORAL_TABLET | ORAL | Status: AC
  Administered 2023-03-14: 650 mg via ORAL
  Filled 2023-03-14: qty 2

## 2023-03-14 MED ORDER — DIPHENHYDRAMINE HCL 25 MG PO CAPS: 25.0000 mg | ORAL_CAPSULE | Freq: Once | ORAL | Status: AC

## 2023-03-14 MED ORDER — METHYLPREDNISOLONE SODIUM SUCC 40 MG IJ SOLR: 10.0000 mg | Freq: Once | INTRAMUSCULAR | Status: AC

## 2023-03-14 MED ORDER — ACETAMINOPHEN 325 MG PO TABS: 650.0000 mg | ORAL_TABLET | Freq: Once | ORAL | Status: AC

## 2023-03-14 MED ORDER — METHYLPREDNISOLONE SODIUM SUCC 40 MG IJ SOLR
INTRAMUSCULAR | Status: AC
  Administered 2023-03-14: 10 mg via INTRAVENOUS
  Filled 2023-03-14: qty 1

## 2023-03-14 MED ORDER — IMMUNE GLOBULIN (HUMAN) 5 GM/50ML IV SOLN
Freq: Once | INTRAVENOUS | Status: AC
  Filled 2023-03-14: qty 50

## 2023-03-14 MED ORDER — DIPHENHYDRAMINE HCL 25 MG PO CAPS
ORAL_CAPSULE | ORAL | Status: AC
  Administered 2023-03-14: 25 mg via ORAL
  Filled 2023-03-14: qty 1

## 2023-03-14 MED FILL — Immune Globulin (Human) IV Soln 20 GM/200ML: INTRAVENOUS | Qty: 200 | Status: AC

## 2023-03-14 MED FILL — Immune Globulin (Human) IV Soln 5 GM/50ML: INTRAVENOUS | Qty: 50 | Status: AC

## 2023-03-14 MED FILL — Immune Globulin (Human) IV Soln 10 GM/100ML: INTRAVENOUS | Qty: 100 | Status: AC

## 2023-03-15 ENCOUNTER — Ambulatory Visit
Admission: RE | Admit: 2023-03-15 | Discharge: 2023-03-15 | Disposition: A | Discharge status: Home / Self Care | Payer: Medicare PPO | Source: Ambulatory Visit | Attending: Rheumatology | Admitting: Rheumatology

## 2023-03-15 DIAGNOSIS — M3320 Polymyositis, organ involvement unspecified: Secondary | ICD-10-CM | POA: Insufficient documentation

## 2023-03-15 MED ORDER — METHYLPREDNISOLONE SODIUM SUCC 40 MG IJ SOLR: 10.0000 mg | Freq: Once | INTRAMUSCULAR | Status: AC

## 2023-03-15 MED ORDER — ACETAMINOPHEN 325 MG PO TABS
ORAL_TABLET | ORAL | Status: AC
  Administered 2023-03-15: 650 mg via ORAL
  Filled 2023-03-15: qty 2

## 2023-03-15 MED ORDER — METHYLPREDNISOLONE SODIUM SUCC 40 MG IJ SOLR
INTRAMUSCULAR | Status: AC
  Administered 2023-03-15: 10 mg via INTRAVENOUS
  Filled 2023-03-15: qty 1

## 2023-03-15 MED ORDER — ACETAMINOPHEN 325 MG PO TABS: 650.0000 mg | ORAL_TABLET | Freq: Once | ORAL | Status: AC

## 2023-03-15 MED ORDER — DIPHENHYDRAMINE HCL 25 MG PO CAPS
ORAL_CAPSULE | ORAL | Status: AC
  Administered 2023-03-15: 25 mg via ORAL
  Filled 2023-03-15: qty 1

## 2023-03-15 MED ORDER — DIPHENHYDRAMINE HCL 25 MG PO CAPS: 25.0000 mg | ORAL_CAPSULE | Freq: Once | ORAL | Status: AC

## 2023-03-15 MED ORDER — IMMUNE GLOBULIN (HUMAN) 10 GM/100ML IV SOLN
400.0000 mg/kg | Freq: Once | INTRAVENOUS | Status: AC
  Administered 2023-03-15: 35 g via INTRAVENOUS
  Filled 2023-03-15: qty 350

## 2023-03-16 ENCOUNTER — Ambulatory Visit
Admission: RE | Admit: 2023-03-16 | Discharge: 2023-03-16 | Disposition: A | Discharge status: Home / Self Care | Payer: Medicare PPO | Source: Ambulatory Visit | Attending: Rheumatology | Admitting: Rheumatology

## 2023-03-16 DIAGNOSIS — M3320 Polymyositis, organ involvement unspecified: Secondary | ICD-10-CM | POA: Insufficient documentation

## 2023-03-16 DIAGNOSIS — M3322 Polymyositis with myopathy: Secondary | ICD-10-CM | POA: Insufficient documentation

## 2023-03-16 MED ORDER — METHYLPREDNISOLONE SODIUM SUCC 40 MG IJ SOLR
10.0000 mg | Freq: Once | INTRAMUSCULAR | Status: AC
  Administered 2023-03-16: 10 mg via INTRAVENOUS

## 2023-03-16 MED ORDER — ACETAMINOPHEN 325 MG PO TABS
ORAL_TABLET | ORAL | Status: AC
  Filled 2023-03-16: qty 2

## 2023-03-16 MED ORDER — METHYLPREDNISOLONE SODIUM SUCC 40 MG IJ SOLR
INTRAMUSCULAR | Status: AC
  Filled 2023-03-16: qty 1

## 2023-03-16 MED ORDER — DIPHENHYDRAMINE HCL 25 MG PO CAPS
ORAL_CAPSULE | ORAL | Status: AC
  Filled 2023-03-16: qty 1

## 2023-03-16 MED ORDER — IMMUNE GLOBULIN (HUMAN) 10 GM/100ML IV SOLN
400.0000 mg/kg | Freq: Once | INTRAVENOUS | Status: AC
  Administered 2023-03-16: 35 g via INTRAVENOUS
  Filled 2023-03-16: qty 350

## 2023-03-16 MED ORDER — DIPHENHYDRAMINE HCL 25 MG PO CAPS
25.0000 mg | ORAL_CAPSULE | Freq: Once | ORAL | Status: AC
  Administered 2023-03-16: 25 mg via ORAL

## 2023-03-16 MED ORDER — ACETAMINOPHEN 325 MG PO TABS
650.0000 mg | ORAL_TABLET | Freq: Once | ORAL | Status: AC
  Administered 2023-03-16: 650 mg via ORAL

## 2023-03-16 MED FILL — Immune Globulin (Human) IV Soln 5 GM/50ML: INTRAVENOUS | Qty: 50 | Status: AC

## 2023-03-16 MED FILL — Immune Globulin (Human) IV Soln 10 GM/100ML: INTRAVENOUS | Qty: 100 | Status: AC

## 2023-03-16 MED FILL — Immune Globulin (Human) IV Soln 20 GM/200ML: INTRAVENOUS | Qty: 200 | Status: AC

## 2023-03-19 DIAGNOSIS — M3322 Polymyositis with myopathy: Secondary | ICD-10-CM | POA: Diagnosis not present

## 2023-03-19 DIAGNOSIS — Z7960 Long term (current) use of unspecified immunomodulators and immunosuppressants: Secondary | ICD-10-CM | POA: Diagnosis not present

## 2023-03-19 DIAGNOSIS — M5116 Intervertebral disc disorders with radiculopathy, lumbar region: Secondary | ICD-10-CM | POA: Diagnosis not present

## 2023-03-27 ENCOUNTER — Telehealth: Payer: Self-pay | Admitting: Internal Medicine

## 2023-03-27 NOTE — Telephone Encounter (Signed)
Atorvastatin--notified that patient is not on statin [phone] phone Lenell Antu rep from [facility] Sending a fax

## 2023-03-27 NOTE — Telephone Encounter (Signed)
Spoke to [employer]

## 2023-03-27 NOTE — Telephone Encounter (Signed)
Statins are contraindicated due to past side effects on 2 different ones.

## 2023-03-28 ENCOUNTER — Other Ambulatory Visit: Payer: Self-pay | Admitting: Internal Medicine

## 2023-04-03 ENCOUNTER — Encounter: Payer: Self-pay | Admitting: Internal Medicine

## 2023-04-03 ENCOUNTER — Ambulatory Visit: Payer: Medicare PPO | Admitting: Internal Medicine

## 2023-04-03 VITALS — BP 130/70 | HR 85 | Temp 97.5°F | Ht 65.5 in | Wt 197.0 lb

## 2023-04-03 DIAGNOSIS — M25562 Pain in left knee: Secondary | ICD-10-CM | POA: Diagnosis not present

## 2023-04-03 NOTE — Assessment & Plan Note (Signed)
Since direct fall onto concrete No pain to suggest fracture --and now a month out ROM good---no sig arthritic changes No meniscus or ligament laxity  Discussed probably still bruised Recommended heat and topical diclofenac If ongoing symptoms, would set up with DR Copland

## 2023-04-03 NOTE — Patient Instructions (Signed)
Please try heat on the knee.  You can also try over the counter diclofenac gel three times a day. If you have ongoing symptoms, please set up with my sports medicine partner Dr Sandi Raveling here in this office.

## 2023-04-03 NOTE — Progress Notes (Signed)
Subjective:    Patient ID: Bridget Romero, female    DOB: 12/24/1946, 77 y.o.   MRN: [id number]  HPI Here due to left knee pain  Larey Seat going to the mailbox about a month ago Larey Seat directly onto both knees on concrete Got up without a problem No problems with right knee---but ongoing left knee symptoms  Seemed to have more pain after a couple of days No problems if sitting Gets sense of it giving way when she gets up--not overly painful No sig swelling--or maybe just slight Taking tylenol  Using ice compresses Using OTC muscle rub---all helps some  Seemed to be improving--then worse in the past few days  Current Outpatient Medications on File Prior to Visit  Medication Sig Dispense Refill   Accu-Chek FastClix Lancets MISC USE TO TEST BLOOD SUGAR TWICE DAILY DX E11.40 102 each 11   ACCU-CHEK SMARTVIEW test strip USE AS DIRECTED TO CHECK BLOOD SUGAR TWICE DAILY 100 each 6   acetaminophen (TYLENOL) 500 MG tablet Take 500 mg by mouth every 6 (six) hours as needed.     Alcohol Swabs (B-D SINGLE USE SWABS REGULAR) PADS Use to test blood sugar once daily dx: 250.00 100 each 3   BD SYRINGE SLIP TIP 25G X 5/8" 1 ML MISC      Blood Glucose Calibration (ACCU-CHEK AVIVA) SOLN Use as directed 1 each 3   Cholecalciferol (VITAMIN D) 1000 UNITS capsule Take 1,000 Units by mouth daily.     cyclobenzaprine (FLEXERIL) 5 MG tablet Take 5 mg by mouth 2 (two) times daily.     ferrous sulfate 325 (65 FE) MG tablet Take 325 mg by mouth daily with breakfast.     folic acid (FOLVITE) 1 MG tablet Take 1 mg by mouth daily.     gabapentin (NEURONTIN) 300 MG capsule Take 1 capsule by mouth 3 (three) times daily.     Insulin Syringe-Needle U-100 31G X 5/16" 1 ML MISC by Does not apply route.     LANTUS 100 UNIT/ML injection INJECT 10 TO 20 UNITS INTO THE SKIN DAILY AS DIRECTED 10 mL 3   lisinopril (ZESTRIL) 10 MG tablet TAKE 1 TABLET BY MOUTH ONCE A DAY 90 tablet 3   magnesium citrate SOLN Take 148 mLs  by mouth once a week.     metFORMIN (GLUCOPHAGE) 1000 MG tablet TAKE 1 TABLET BY MOUTH TWICE A DAY 180 tablet 3   methotrexate 250 MG/10ML injection Inject into the skin.      Multiple Vitamin (MULTIVITAMIN) tablet Take 1 tablet by mouth daily.     nystatin (MYCOSTATIN/NYSTOP) powder Apply 1 Application topically 3 (three) times daily. 60 g 2   predniSONE (DELTASONE) 2.5 MG tablet Take 2.5 mg by mouth daily.     trospium (SANCTURA) 20 MG tablet Take 1 tab 1 hour prior to bedtime 30 tablet 11   No current facility-administered medications on file prior to visit.    Allergies  Allergen Reactions   Atorvastatin Other (See Comments)    myalgias   Pravastatin Other (See Comments)    Muscle pain    Past Medical History:  Diagnosis Date   Allergy    Asthma    Diabetes mellitus    Hyperlipidemia    Hypertension    Neuromuscular disorder    polymyositis   Osteoarthritis    Vitamin B12 deficiency     Past Surgical History:  Procedure Laterality Date   COLONOSCOPY WITH PROPOFOL N/A 03/07/2016   Procedure: COLONOSCOPY WITH  PROPOFOL;  Surgeon: Christena Deem, MD;  Location: [facility] ENDOSCOPY;  Service: Endoscopy;  Laterality: N/A;    Family History  Problem Relation Age of Onset   Heart disease Mother    Cancer Father    Breast cancer Neg Hx     Social History   Socioeconomic History   Marital status: Widowed    Spouse name: Not on file   Number of children: 2   Years of education: Not on file   Highest education level: Not on file  Occupational History   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 [employer]    Comment:    Tobacco Use   Smoking status: Former    Passive exposure: Never   Smokeless tobacco: Never   Tobacco comments:    age 60 when stopped smoking  Vaping Use   Vaping Use: Never used  Substance and Sexual Activity   Alcohol use: No   Drug use: No   Sexual activity: Not on file  Other Topics Concern   Not on file  Social History Narrative    has living will   Son and daughter should make health care decisions for her   Would accept resuscitation   Not sure about tube feeds   Social Determinants of Health   Financial Resource Strain: Not on file  Food Insecurity: Not on file  Transportation Needs: Not on file  Physical Activity: Not on file  Stress: Not on file  Social Connections: Not on file  Intimate Partner Violence: Not on file   Review of Systems No other joint problems Not sick     Objective:   Physical Exam Musculoskeletal:     Comments: No sig effusion in left knee ROM is normal--including flexion No ligament or meniscus findings No patellar tenderness  Neurological:     Comments: Normal gait No muscle weakness in legs            Assessment & Plan:

## 2023-04-10 ENCOUNTER — Other Ambulatory Visit: Payer: Self-pay | Admitting: Internal Medicine

## 2023-04-24 ENCOUNTER — Telehealth: Payer: Self-pay

## 2023-04-24 NOTE — Telephone Encounter (Signed)
PA request received via CMM for Nystop 100000 UNIT/GM powder  PA has been submitted to [facility] and is pending determination  Key: [id number]

## 2023-04-25 ENCOUNTER — Other Ambulatory Visit (HOSPITAL_COMMUNITY): Payer: Self-pay

## 2023-04-25 ENCOUNTER — Encounter: Payer: Self-pay | Admitting: Internal Medicine

## 2023-04-25 NOTE — Telephone Encounter (Signed)
Patient Advocate Encounter  Prior Authorization for Nystop 100000 UNIT/GM powder has been approved with [employer].    Per [facility] test claim, copay for 20 days supply is $4.50  PA# [id number] Effective dates: 12/25/22 through 12/25/23

## 2023-05-07 MED FILL — Iron Sucrose Inj 20 MG/ML (Fe Equiv): INTRAVENOUS | Qty: 10 | Status: AC

## 2023-05-08 ENCOUNTER — Inpatient Hospital Stay: Payer: Medicare PPO

## 2023-05-08 ENCOUNTER — Ambulatory Visit
Admission: RE | Admit: 2023-05-08 | Discharge: 2023-05-08 | Disposition: A | Discharge status: Home / Self Care | Payer: Medicare PPO | Source: Ambulatory Visit | Attending: Rheumatology | Admitting: Rheumatology

## 2023-05-08 ENCOUNTER — Inpatient Hospital Stay (HOSPITAL_BASED_OUTPATIENT_CLINIC_OR_DEPARTMENT_OTHER): Payer: Medicare PPO | Admitting: Internal Medicine

## 2023-05-08 ENCOUNTER — Encounter: Payer: Self-pay | Admitting: Internal Medicine

## 2023-05-08 ENCOUNTER — Inpatient Hospital Stay: Payer: Medicare PPO | Attending: Internal Medicine

## 2023-05-08 VITALS — BP 170/77 | HR 73 | Temp 97.0°F | Resp 18 | Ht 66.0 in | Wt 195.0 lb

## 2023-05-08 DIAGNOSIS — M3322 Polymyositis with myopathy: Secondary | ICD-10-CM | POA: Insufficient documentation

## 2023-05-08 DIAGNOSIS — Z809 Family history of malignant neoplasm, unspecified: Secondary | ICD-10-CM | POA: Insufficient documentation

## 2023-05-08 DIAGNOSIS — M3320 Polymyositis, organ involvement unspecified: Secondary | ICD-10-CM | POA: Insufficient documentation

## 2023-05-08 DIAGNOSIS — Z87891 Personal history of nicotine dependence: Secondary | ICD-10-CM | POA: Insufficient documentation

## 2023-05-08 DIAGNOSIS — D649 Anemia, unspecified: Secondary | ICD-10-CM

## 2023-05-08 LAB — CBC WITH DIFFERENTIAL (CANCER CENTER ONLY)
Abs Immature Granulocytes: 0.03 10*3/uL (ref 0.00–0.07)
Basophils Absolute: 0 10*3/uL (ref 0.0–0.1)
Basophils Relative: 0 %
Eosinophils Absolute: 0.1 10*3/uL (ref 0.0–0.5)
Eosinophils Relative: 1 %
HCT: 32.6 % — ABNORMAL LOW (ref 36.0–46.0)
Hemoglobin: 10 g/dL — ABNORMAL LOW (ref 12.0–15.0)
Immature Granulocytes: 0 %
Lymphocytes Relative: 9 %
Lymphs Abs: 0.6 10*3/uL — ABNORMAL LOW (ref 0.7–4.0)
MCH: 28.7 pg (ref 26.0–34.0)
MCHC: 30.7 g/dL (ref 30.0–36.0)
MCV: 93.4 fL (ref 80.0–100.0)
Monocytes Absolute: 0.1 10*3/uL (ref 0.1–1.0)
Monocytes Relative: 1 %
Neutro Abs: 6 10*3/uL (ref 1.7–7.7)
Neutrophils Relative %: 89 %
Platelet Count: 349 10*3/uL (ref 150–400)
RBC: 3.49 MIL/uL — ABNORMAL LOW (ref 3.87–5.11)
RDW: 14.3 % (ref 11.5–15.5)
WBC Count: 6.8 10*3/uL (ref 4.0–10.5)
nRBC: 0 % (ref 0.0–0.2)

## 2023-05-08 LAB — BASIC METABOLIC PANEL - CANCER CENTER ONLY
Anion gap: 8 (ref 5–15)
BUN: 20 mg/dL (ref 8–23)
CO2: 24 mmol/L (ref 22–32)
Calcium: 9.2 mg/dL (ref 8.9–10.3)
Chloride: 104 mmol/L (ref 98–111)
Creatinine: 0.86 mg/dL (ref 0.44–1.00)
GFR, Estimated: >60 mL/min (ref >60)
Glucose, Bld: 177 mg/dL — ABNORMAL HIGH (ref 70–99)
Potassium: 4.3 mmol/L (ref 3.5–5.1)
Sodium: 136 mmol/L (ref 135–145)

## 2023-05-08 LAB — LACTATE DEHYDROGENASE: LDH: 180 U/L (ref 98–192)

## 2023-05-08 LAB — FOLATE: Folate: >40 ng/mL (ref >5.9)

## 2023-05-08 MED ORDER — ACETAMINOPHEN 325 MG PO TABS
ORAL_TABLET | ORAL | Status: AC
  Filled 2023-05-08: qty 2

## 2023-05-08 MED ORDER — ACETAMINOPHEN 325 MG PO TABS
650.0000 mg | ORAL_TABLET | Freq: Once | ORAL | Status: AC
  Administered 2023-05-08: 650 mg via ORAL

## 2023-05-08 MED ORDER — METHYLPREDNISOLONE SODIUM SUCC 40 MG IJ SOLR
10.0000 mg | Freq: Once | INTRAMUSCULAR | Status: AC
  Administered 2023-05-08: 10 mg via INTRAVENOUS

## 2023-05-08 MED ORDER — DIPHENHYDRAMINE HCL 25 MG PO CAPS
ORAL_CAPSULE | ORAL | Status: AC
  Filled 2023-05-08: qty 1

## 2023-05-08 MED ORDER — IMMUNE GLOBULIN (HUMAN) 10 GM/100ML IV SOLN
400.0000 mg/kg | Freq: Once | INTRAVENOUS | Status: AC
  Administered 2023-05-08: 30 g via INTRAVENOUS
  Filled 2023-05-08: qty 300

## 2023-05-08 MED ORDER — DIPHENHYDRAMINE HCL 25 MG PO CAPS
25.0000 mg | ORAL_CAPSULE | Freq: Once | ORAL | Status: AC
  Administered 2023-05-08: 25 mg via ORAL

## 2023-05-08 MED ORDER — METHYLPREDNISOLONE SODIUM SUCC 40 MG IJ SOLR
INTRAMUSCULAR | Status: AC
  Filled 2023-05-08: qty 1

## 2023-05-08 NOTE — Progress Notes (Signed)
Pt feels energy is good. Stools are black from oral iron. No nausea or diarrhea. Has some discomfort in bilateral legs. Appetite is good. Denies dyspnea.

## 2023-05-08 NOTE — Assessment & Plan Note (Addendum)
#   Normocytic anemia: chronic Hb ~9-10.  continue PO iron. #Recommend continue gentle iron 1 pill a day. March 2024- I sat- 17%  # However today Hb 10-  stable-  Unclear etiology. Awaiting  further work-up including myeloma panel; HOLD off possible bone marrow biopsy.  No obvious concern for hemolysis.  # Polymyositis [Dr. Gavin Potters; Clinically, she is doing well on IVIG 2 g/kg every 6 weeks [Short stay; per Rheumatology]; also prednisone 2.5 mg a day and MTX 10 mg SQ weekly-  Dr.Patel; Rheum- stable.   # Disposition:  # no venofer today # follow up in 6 months- MD; cbc/bmp; LDH; Multiple myeloma panel; Kappa lamda chains; B12; folic acid; possible venofer- Dr.B.  Cc; Dr.Kernodle.

## 2023-05-08 NOTE — Progress Notes (Signed)
Fatigue/weakness: Dyspena: Light headedness: Blood in stool:  

## 2023-05-08 NOTE — Progress Notes (Signed)
[facility] CONSULT NOTE  Patient Care Team: Karie Schwalbe, MD as PCP - General (Internal Medicine) Kandyce Rud., MD (Rheumatology) Earna Coder, MD as Consulting Physician (Internal Medicine)  CHIEF COMPLAINTS/PURPOSE OF CONSULTATION: Polymyositis/; Anemia  #Polymyositis [Dr. Gavin Potters; 2019; left arm biopsy]; low dose prednisone/methotrexate/IVIG infusions 400 mg kilogram daily x4 days every 6 weeks Reinaldo Raddle Rheum; Dr.Patel]  Oncology History   No history exists.    HISTORY OF PRESENTING ILLNESS: Alone.  Walks independently.  Bridget Romero 77 y.o.  female with a history of polymyositis currently on IVIG [as per Rheumatology] is here for follow-up of anemia.  Pt feels energy is good. Stools are black from oral iron. No nausea or diarrhea. Has some discomfort in bilateral legs. Appetite is good. Denies dyspnea   She denies any blood in stools or black-colored stools.  Denies any blood in urine.  Patient denies any worsening weakness in arms or legs.  No falls.  No headaches.  No nausea no vomiting no fevers or chills.  Review of Systems  Constitutional:  Positive for malaise/fatigue. Negative for chills, diaphoresis, fever and weight loss.  HENT:  Negative for nosebleeds and sore throat.   Eyes:  Negative for double vision.  Respiratory:  Negative for cough, hemoptysis, sputum production, shortness of breath and wheezing.   Cardiovascular:  Negative for chest pain, palpitations, orthopnea and leg swelling.  Gastrointestinal:  Negative for abdominal pain, blood in stool, constipation, diarrhea, heartburn, melena, nausea and vomiting.  Genitourinary:  Negative for dysuria, frequency and urgency.  Musculoskeletal:  Negative for back pain and joint pain.  Skin: Negative.  Negative for itching and rash.  Neurological:  Negative for dizziness, tingling, focal weakness, weakness and headaches.  Endo/Heme/Allergies:  Does not bruise/bleed easily.   Psychiatric/Behavioral:  Negative for depression. The patient is not nervous/anxious and does not have insomnia.      MEDICAL HISTORY:  Past Medical History:  Diagnosis Date   Allergy    Asthma    Diabetes mellitus    Hyperlipidemia    Hypertension    Neuromuscular disorder (HCC)    polymyositis   Osteoarthritis    Vitamin B12 deficiency     SURGICAL HISTORY: Past Surgical History:  Procedure Laterality Date   COLONOSCOPY WITH PROPOFOL N/A 03/07/2016   Procedure: COLONOSCOPY WITH PROPOFOL;  Surgeon: Christena Deem, MD;  Location: [facility] ENDOSCOPY;  Service: Endoscopy;  Laterality: N/A;    SOCIAL HISTORY: Social History   Socioeconomic History   Marital status: Widowed    Spouse name: Not on file   Number of children: 2   Years of education: Not on file   Highest education level: Not on file  Occupational History   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 [employer]    Comment:    Tobacco Use   Smoking status: Former    Passive exposure: Never   Smokeless tobacco: Never   Tobacco comments:    age 30 when stopped smoking  Vaping Use   Vaping Use: Never used  Substance and Sexual Activity   Alcohol use: No   Drug use: No   Sexual activity: Not on file  Other Topics Concern   Not on file  Social History Narrative   has living will   Son and daughter should make health care decisions for her   Would accept resuscitation   Not sure about tube feeds   Social Determinants of Health   Financial Resource Strain: Not on file  Food Insecurity: Not on file  Transportation Needs: Not on file  Physical Activity: Not on file  Stress: Not on file  Social Connections: Not on file  Intimate Partner Violence: Not on file    FAMILY HISTORY: Family History  Problem Relation Age of Onset   Heart disease Mother    Cancer Father    Breast cancer Neg Hx     ALLERGIES:  is allergic to atorvastatin and pravastatin.  MEDICATIONS:  Current Outpatient  Medications  Medication Sig Dispense Refill   Accu-Chek FastClix Lancets MISC USE TO TEST BLOOD SUGAR TWICE DAILY DX E11.40 102 each 11   ACCU-CHEK SMARTVIEW test strip USE AS DIRECTED TO CHECK BLOOD SUGAR TWICE DAILY 100 each 6   acetaminophen (TYLENOL) 500 MG tablet Take 500 mg by mouth every 6 (six) hours as needed.     Alcohol Swabs (B-D SINGLE USE SWABS REGULAR) PADS Use to test blood sugar once daily dx: 250.00 100 each 3   BD SYRINGE SLIP TIP 25G X 5/8" 1 ML MISC      Blood Glucose Calibration (ACCU-CHEK AVIVA) SOLN Use as directed 1 each 3   Cholecalciferol (VITAMIN D) 1000 UNITS capsule Take 1,000 Units by mouth daily.     cyclobenzaprine (FLEXERIL) 5 MG tablet Take 5 mg by mouth 2 (two) times daily.     ferrous sulfate 325 (65 FE) MG tablet Take 325 mg by mouth daily with breakfast.     folic acid (FOLVITE) 1 MG tablet Take 1 mg by mouth daily.     gabapentin (NEURONTIN) 300 MG capsule Take 1 capsule by mouth 3 (three) times daily.     Insulin Syringe-Needle U-100 31G X 5/16" 1 ML MISC by Does not apply route.     LANTUS 100 UNIT/ML injection INJECT 10 TO 20 UNITS INTO THE SKIN DAILY AS DIRECTED 10 mL 3   lisinopril (ZESTRIL) 10 MG tablet TAKE 1 TABLET BY MOUTH ONCE A DAY 90 tablet 3   magnesium citrate SOLN Take 148 mLs by mouth once a week.     metFORMIN (GLUCOPHAGE) 1000 MG tablet TAKE 1 TABLET BY MOUTH TWICE A DAY 180 tablet 3   methotrexate 250 MG/10ML injection Inject into the skin.      Multiple Vitamin (MULTIVITAMIN) tablet Take 1 tablet by mouth daily.     nystatin (MYCOSTATIN/NYSTOP) powder Apply 1 Application topically 3 (three) times daily. 60 g 2   predniSONE (DELTASONE) 2.5 MG tablet Take 2.5 mg by mouth daily.     trospium (SANCTURA) 20 MG tablet Take 1 tab 1 hour prior to bedtime 30 tablet 11   TRUEPLUS INSULIN SYRINGE 31G X 5/16" 0.5 ML MISC USE ONE SYRINGE DAILY TO INJECT LANTUS UNDER THE SKIN. 100 each 4   No current facility-administered medications for this  visit.      Marland Kitchen  PHYSICAL EXAMINATION: ECOG PERFORMANCE STATUS: 0 - Asymptomatic  Vitals:   05/08/23 1318  BP: (!) 170/77  Pulse: 73  Resp: 18  Temp: (!) 97 F (36.1 C)  SpO2: 100%   Filed Weights   05/08/23 1318  Weight: 195 lb (88.5 kg)    Physical Exam Vitals and nursing note reviewed.  Constitutional:      Comments:    HENT:     Head: Normocephalic and atraumatic.     Mouth/Throat:     Pharynx: Oropharynx is clear.  Eyes:     Extraocular Movements: Extraocular movements intact.     Pupils: Pupils are equal, round,  and reactive to light.  Cardiovascular:     Rate and Rhythm: Normal rate and regular rhythm.  Pulmonary:     Comments: Decreased breath sounds bilaterally.  Abdominal:     Palpations: Abdomen is soft.  Musculoskeletal:        General: Normal range of motion.     Cervical back: Normal range of motion.  Skin:    General: Skin is warm.  Neurological:     General: No focal deficit present.     Mental Status: She is alert and oriented to person, place, and time.  Psychiatric:        Behavior: Behavior normal.        Judgment: Judgment normal.     LABORATORY DATA:  I have reviewed the data as listed Lab Results  Component Value Date   WBC 6.8 05/08/2023   HGB 10.0 (L) 05/08/2023   HCT 32.6 (L) 05/08/2023   MCV 93.4 05/08/2023   PLT 349 05/08/2023   Recent Labs    08/01/22 0922 11/01/22 1350 11/21/22 0921 03/02/23 1300 05/08/23 1254  NA 141 137 141 138 136  K 3.8 4.1 3.7 4.6 4.3  CL 111 105 108 108 104  CO2 23 24 27 24 24   GLUCOSE 166* 160* 160* 173* 177*  BUN 24* 21 21 20 20   CREATININE 0.78 0.87 0.80 0.77 0.86  CALCIUM 9.4 9.3 9.5 9.3 9.2  GFRNONAA >60 >60 >60 >60 >60  PROT 7.1 7.3 7.0  --   --   ALBUMIN 3.7 3.6 3.8  --   --   AST 31 33 37  --   --   ALT 20 30 29   --   --   ALKPHOS 64 52 57  --   --   BILITOT 0.7 0.6 0.7  --   --     RADIOGRAPHIC STUDIES: I have personally reviewed the radiological images as listed and  agreed with the findings in the report. No results found.  ASSESSMENT & PLAN:   Symptomatic anemia # Normocytic anemia: chronic Hb ~9-10.  continue PO iron. #Recommend continue gentle iron 1 pill a day. March 2024- I sat- 17%  # However today Hb 10-  stable-  Unclear etiology. Awaiting  further work-up including myeloma panel; HOLD off possible bone marrow biopsy.  No obvious concern for hemolysis.  # Polymyositis [Dr. Gavin Potters; Clinically, she is doing well on IVIG 2 g/kg every 6 weeks [Short stay; per Rheumatology]; also prednisone 2.5 mg a day and MTX 10 mg SQ weekly-  Dr.Patel; Rheum- stable.   # Disposition:  # no venofer today # follow up in 6 months- MD; cbc/bmp; LDH; Multiple myeloma panel; Kappa lamda chains; B12; folic acid; possible venofer- Dr.B.  Cc; Dr.Kernodle.   All questions were answered. The patient knows to call the clinic with any problems, questions or concerns.    Earna Coder, MD 05/08/2023 1:50 PM

## 2023-05-09 ENCOUNTER — Ambulatory Visit
Admission: RE | Admit: 2023-05-09 | Discharge: 2023-05-09 | Disposition: A | Discharge status: Home / Self Care | Payer: Medicare PPO | Source: Ambulatory Visit | Attending: Rheumatology | Admitting: Rheumatology

## 2023-05-09 DIAGNOSIS — M25562 Pain in left knee: Secondary | ICD-10-CM | POA: Insufficient documentation

## 2023-05-09 DIAGNOSIS — M3322 Polymyositis with myopathy: Secondary | ICD-10-CM | POA: Diagnosis not present

## 2023-05-09 LAB — KAPPA/LAMBDA LIGHT CHAINS
Kappa free light chain: 47.1 mg/L — ABNORMAL HIGH (ref 3.3–19.4)
Kappa, lambda light chain ratio: 1.7 — ABNORMAL HIGH (ref 0.26–1.65)
Lambda free light chains: 27.7 mg/L — ABNORMAL HIGH (ref 5.7–26.3)

## 2023-05-09 LAB — VITAMIN B12: Vitamin B-12: 894 pg/mL (ref 180–914)

## 2023-05-09 MED ORDER — ACETAMINOPHEN 325 MG PO TABS
650.0000 mg | ORAL_TABLET | Freq: Once | ORAL | Status: AC
  Administered 2023-05-09: 650 mg via ORAL

## 2023-05-09 MED ORDER — METHYLPREDNISOLONE SODIUM SUCC 40 MG IJ SOLR
INTRAMUSCULAR | Status: AC
  Filled 2023-05-09: qty 1

## 2023-05-09 MED ORDER — IMMUNE GLOBULIN (HUMAN) 10 GM/100ML IV SOLN
400.0000 mg/kg | Freq: Once | INTRAVENOUS | Status: AC
  Administered 2023-05-09: 30 g via INTRAVENOUS
  Filled 2023-05-09: qty 300

## 2023-05-09 MED ORDER — METHYLPREDNISOLONE SODIUM SUCC 40 MG IJ SOLR
10.0000 mg | Freq: Once | INTRAMUSCULAR | Status: AC
  Administered 2023-05-09: 10 mg via INTRAVENOUS

## 2023-05-09 MED ORDER — DIPHENHYDRAMINE HCL 25 MG PO CAPS
ORAL_CAPSULE | ORAL | Status: AC
  Filled 2023-05-09: qty 1

## 2023-05-09 MED ORDER — DIPHENHYDRAMINE HCL 25 MG PO CAPS
25.0000 mg | ORAL_CAPSULE | Freq: Once | ORAL | Status: AC
  Administered 2023-05-09: 25 mg via ORAL

## 2023-05-09 MED ORDER — ACETAMINOPHEN 325 MG PO TABS
ORAL_TABLET | ORAL | Status: AC
  Filled 2023-05-09: qty 2

## 2023-05-10 ENCOUNTER — Ambulatory Visit
Admission: RE | Admit: 2023-05-10 | Discharge: 2023-05-10 | Disposition: A | Discharge status: Home / Self Care | Payer: Medicare PPO | Source: Ambulatory Visit | Attending: Internal Medicine | Admitting: Internal Medicine

## 2023-05-10 DIAGNOSIS — M3322 Polymyositis with myopathy: Secondary | ICD-10-CM | POA: Diagnosis not present

## 2023-05-10 MED ORDER — ACETAMINOPHEN 325 MG PO TABS
650.0000 mg | ORAL_TABLET | Freq: Once | ORAL | Status: AC
  Administered 2023-05-10: 650 mg via ORAL

## 2023-05-10 MED ORDER — IMMUNE GLOBULIN (HUMAN) 10 GM/100ML IV SOLN
400.0000 mg/kg | Freq: Once | INTRAVENOUS | Status: AC
  Administered 2023-05-10: 30 g via INTRAVENOUS
  Filled 2023-05-10: qty 300

## 2023-05-10 MED ORDER — ACETAMINOPHEN 325 MG PO TABS
ORAL_TABLET | ORAL | Status: AC
  Filled 2023-05-10: qty 2

## 2023-05-10 MED ORDER — DIPHENHYDRAMINE HCL 25 MG PO CAPS
ORAL_CAPSULE | ORAL | Status: AC
  Filled 2023-05-10: qty 1

## 2023-05-10 MED ORDER — METHYLPREDNISOLONE SODIUM SUCC 40 MG IJ SOLR
10.0000 mg | Freq: Once | INTRAMUSCULAR | Status: AC
  Administered 2023-05-10: 10 mg via INTRAVENOUS

## 2023-05-10 MED ORDER — DIPHENHYDRAMINE HCL 25 MG PO CAPS
25.0000 mg | ORAL_CAPSULE | Freq: Once | ORAL | Status: AC
  Administered 2023-05-10: 25 mg via ORAL

## 2023-05-10 MED ORDER — METHYLPREDNISOLONE SODIUM SUCC 40 MG IJ SOLR
INTRAMUSCULAR | Status: AC
  Filled 2023-05-10: qty 1

## 2023-05-11 ENCOUNTER — Ambulatory Visit
Admission: RE | Admit: 2023-05-11 | Discharge: 2023-05-11 | Disposition: A | Discharge status: Home / Self Care | Payer: Medicare PPO | Source: Ambulatory Visit | Attending: Internal Medicine | Admitting: Internal Medicine

## 2023-05-11 DIAGNOSIS — M3322 Polymyositis with myopathy: Secondary | ICD-10-CM | POA: Diagnosis present

## 2023-05-11 MED ORDER — METHYLPREDNISOLONE SODIUM SUCC 40 MG IJ SOLR
INTRAMUSCULAR | Status: AC
  Filled 2023-05-11: qty 1

## 2023-05-11 MED ORDER — IMMUNE GLOBULIN (HUMAN) 10 GM/100ML IV SOLN
400.0000 mg/kg | Freq: Once | INTRAVENOUS | Status: AC
  Administered 2023-05-11: 30 g via INTRAVENOUS
  Filled 2023-05-11: qty 300

## 2023-05-11 MED ORDER — ACETAMINOPHEN 325 MG PO TABS
650.0000 mg | ORAL_TABLET | Freq: Once | ORAL | Status: AC
  Administered 2023-05-11: 650 mg via ORAL

## 2023-05-11 MED ORDER — ACETAMINOPHEN 325 MG PO TABS
ORAL_TABLET | ORAL | Status: AC
  Filled 2023-05-11: qty 2

## 2023-05-11 MED ORDER — METHYLPREDNISOLONE SODIUM SUCC 40 MG IJ SOLR
10.0000 mg | Freq: Once | INTRAMUSCULAR | Status: AC
  Administered 2023-05-11: 10 mg via INTRAVENOUS

## 2023-05-11 MED ORDER — DIPHENHYDRAMINE HCL 25 MG PO CAPS
ORAL_CAPSULE | ORAL | Status: AC
  Filled 2023-05-11: qty 1

## 2023-05-11 MED ORDER — DIPHENHYDRAMINE HCL 25 MG PO CAPS
25.0000 mg | ORAL_CAPSULE | Freq: Once | ORAL | Status: AC
  Administered 2023-05-11: 25 mg via ORAL

## 2023-05-14 ENCOUNTER — Telehealth: Payer: Self-pay | Admitting: Internal Medicine

## 2023-05-14 NOTE — Telephone Encounter (Signed)
Patient contacted the office requesting a call back from clinical staff regarding medication nystatin (MYCOSTATIN/NYSTOP) powder . Would like a call back whenever possible. Please advise, thank you.

## 2023-05-14 NOTE — Telephone Encounter (Signed)
Called pt. She received a letter from her insurance that they will cover the powder now. She asked for a Rx. I advised her the rx was at [facility]. Advised her to call the pharmacy.

## 2023-05-15 LAB — MULTIPLE MYELOMA PANEL, SERUM
Albumin SerPl Elph-Mcnc: 3.5 g/dL (ref 2.9–4.4)
Albumin/Glob SerPl: 0.8 (ref 0.7–1.7)
Alpha 1: 0.2 g/dL (ref 0.0–0.4)
Alpha2 Glob SerPl Elph-Mcnc: 0.8 g/dL (ref 0.4–1.0)
B-Globulin SerPl Elph-Mcnc: 1 g/dL (ref 0.7–1.3)
Gamma Glob SerPl Elph-Mcnc: 2.5 g/dL — ABNORMAL HIGH (ref 0.4–1.8)
Globulin, Total: 4.5 g/dL — ABNORMAL HIGH (ref 2.2–3.9)
IgA: 147 mg/dL (ref 64–422)
IgG (Immunoglobin G), Serum: 2595 mg/dL — ABNORMAL HIGH (ref 586–1602)
IgM (Immunoglobulin M), Srm: 90 mg/dL (ref 26–217)
Total Protein ELP: 8 g/dL (ref 6.0–8.5)

## 2023-06-13 ENCOUNTER — Other Ambulatory Visit: Payer: Self-pay | Admitting: Internal Medicine

## 2023-06-13 ENCOUNTER — Encounter: Payer: Self-pay | Admitting: Internal Medicine

## 2023-06-13 ENCOUNTER — Ambulatory Visit: Payer: Medicare PPO | Admitting: Internal Medicine

## 2023-06-13 VITALS — BP 124/84 | HR 97 | Temp 97.9°F | Ht 66.0 in | Wt 192.0 lb

## 2023-06-13 DIAGNOSIS — M545 Low back pain, unspecified: Secondary | ICD-10-CM | POA: Insufficient documentation

## 2023-06-13 DIAGNOSIS — S335XXA Sprain of ligaments of lumbar spine, initial encounter: Secondary | ICD-10-CM

## 2023-06-13 DIAGNOSIS — I10 Essential (primary) hypertension: Secondary | ICD-10-CM

## 2023-06-13 MED ORDER — TIZANIDINE HCL 2 MG PO TABS: 2.0000 mg | ORAL_TABLET | Freq: Every evening | ORAL | 0 refills | Status: DC | PRN

## 2023-06-13 NOTE — Assessment & Plan Note (Signed)
Clearly seems to be muscular Nothing to suggest disc injury Discussed the tylenol regularly Heat Can try tizanidine 2-4 mg at bedtime If ongoing issues--could consider PT

## 2023-06-13 NOTE — Progress Notes (Signed)
Subjective:    Patient ID: Bridget Romero, female    DOB: 1946-06-14, 77 y.o.   MRN: [id number]  HPI Here due to low back pain  Was at sister's house----bad Parkinson's---last week Had to bent over to pick her up to transfer her (125#) Felt pop and wound up dropping her Needed someone else to help get her up Was having back pain--but some better with tylenol Now having trouble rolling over in bed  Pain just in back No leg weakness Walking okay  Did try patches on back---not much help  Current Outpatient Medications on File Prior to Visit  Medication Sig Dispense Refill   Accu-Chek FastClix Lancets MISC USE TO TEST BLOOD SUGAR TWICE DAILY DX E11.40 102 each 11   ACCU-CHEK SMARTVIEW test strip USE AS DIRECTED TO CHECK BLOOD SUGAR TWICE DAILY 100 each 6   acetaminophen (TYLENOL) 500 MG tablet Take 500 mg by mouth every 6 (six) hours as needed.     Alcohol Swabs (B-D SINGLE USE SWABS REGULAR) PADS Use to test blood sugar once daily dx: 250.00 100 each 3   BD SYRINGE SLIP TIP 25G X 5/8" 1 ML MISC      Blood Glucose Calibration (ACCU-CHEK AVIVA) SOLN Use as directed 1 each 3   Cholecalciferol (VITAMIN D) 1000 UNITS capsule Take 1,000 Units by mouth daily.     ferrous sulfate 325 (65 FE) MG tablet Take 325 mg by mouth daily with breakfast.     folic acid (FOLVITE) 1 MG tablet Take 1 mg by mouth daily.     gabapentin (NEURONTIN) 300 MG capsule Take 1 capsule by mouth 3 (three) times daily.     Insulin Syringe-Needle U-100 31G X 5/16" 1 ML MISC by Does not apply route.     LANTUS 100 UNIT/ML injection INJECT 10 TO 20 UNITS INTO THE SKIN DAILY AS DIRECTED 10 mL 3   lisinopril (ZESTRIL) 10 MG tablet TAKE 1 TABLET BY MOUTH ONCE A DAY 90 tablet 3   magnesium citrate SOLN Take 148 mLs by mouth once a week.     metFORMIN (GLUCOPHAGE) 1000 MG tablet TAKE 1 TABLET BY MOUTH TWICE A DAY 180 tablet 3   methotrexate 250 MG/10ML injection Inject into the skin.      Multiple Vitamin  (MULTIVITAMIN) tablet Take 1 tablet by mouth daily.     nystatin (MYCOSTATIN/NYSTOP) powder Apply 1 Application topically 3 (three) times daily. 60 g 2   predniSONE (DELTASONE) 2.5 MG tablet Take 2.5 mg by mouth daily.     trospium (SANCTURA) 20 MG tablet Take 1 tab 1 hour prior to bedtime 30 tablet 11   TRUEPLUS INSULIN SYRINGE 31G X 5/16" 0.5 ML MISC USE ONE SYRINGE DAILY TO INJECT LANTUS UNDER THE SKIN. 100 each 4   No current facility-administered medications on file prior to visit.    Allergies  Allergen Reactions   Atorvastatin Other (See Comments)    myalgias   Pravastatin Other (See Comments)    Muscle pain    Past Medical History:  Diagnosis Date   Allergy    Asthma    Diabetes mellitus    Hyperlipidemia    Hypertension    Neuromuscular disorder (HCC)    polymyositis   Osteoarthritis    Vitamin B12 deficiency     Past Surgical History:  Procedure Laterality Date   COLONOSCOPY WITH PROPOFOL N/A 03/07/2016   Procedure: COLONOSCOPY WITH PROPOFOL;  Surgeon: Christena Deem, MD;  Location: [facility] ENDOSCOPY;  Service:  Endoscopy;  Laterality: N/A;    Family History  Problem Relation Age of Onset   Heart disease Mother    Cancer Father    Breast cancer Neg Hx     Social History   Socioeconomic History   Marital status: Widowed    Spouse name: Not on file   Number of children: 2   Years of education: Not on file   Highest education level: Not on file  Occupational History   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 [employer]    Comment:    Tobacco Use   Smoking status: Former    Passive exposure: Never   Smokeless tobacco: Never   Tobacco comments:    age 89 when stopped smoking  Vaping Use   Vaping Use: Never used  Substance and Sexual Activity   Alcohol use: No   Drug use: No   Sexual activity: Not on file  Other Topics Concern   Not on file  Social History Narrative   has living will   Son and daughter should make health care decisions  for her   Would accept resuscitation   Not sure about tube feeds   Social Determinants of Health   Financial Resource Strain: Not on file  Food Insecurity: Not on file  Transportation Needs: Not on file  Physical Activity: Not on file  Stress: Not on file  Social Connections: Not on file  Intimate Partner Violence: Not on file   Review of Systems No abnormal pelvis sensation No loss of bowel or bladder function    Objective:   Physical Exam Musculoskeletal:     Comments: Tenderness along left lumbar paraspinals Normal back flexion SLR negative Normal ROM in hips  Neurological:     Comments: Normal gait No muscle weakness in legs            Assessment & Plan:

## 2023-07-02 ENCOUNTER — Encounter: Payer: Self-pay | Admitting: Internal Medicine

## 2023-07-02 ENCOUNTER — Ambulatory Visit: Payer: Medicare PPO | Admitting: Internal Medicine

## 2023-07-02 VITALS — BP 124/80 | HR 93 | Temp 97.1°F | Ht 66.0 in | Wt 189.0 lb

## 2023-07-02 DIAGNOSIS — E1140 Type 2 diabetes mellitus with diabetic neuropathy, unspecified: Secondary | ICD-10-CM

## 2023-07-02 DIAGNOSIS — M3320 Polymyositis, organ involvement unspecified: Secondary | ICD-10-CM

## 2023-07-02 DIAGNOSIS — M545 Low back pain, unspecified: Secondary | ICD-10-CM | POA: Diagnosis not present

## 2023-07-02 DIAGNOSIS — Z794 Long term (current) use of insulin: Secondary | ICD-10-CM | POA: Diagnosis not present

## 2023-07-02 LAB — POCT GLYCOSYLATED HEMOGLOBIN (HGB A1C): Hemoglobin A1C: 6.9 % — AB (ref 4.0–5.6)

## 2023-07-02 NOTE — Assessment & Plan Note (Signed)
Has been controlled--despite low dose prednisone Lab Results  Component Value Date   HGBA1C 6.9 (A) 07/02/2023   Improved actually Continue on insulin 10-20 units daily depending on her surgar Metformin 1000 bid

## 2023-07-02 NOTE — Assessment & Plan Note (Signed)
Clearly related to having to lift/transfer sister Discussed safe transferring  Tylenol, heat Discussed lidocaine patches, topical diclofenac. Can occasionally take aleve

## 2023-07-02 NOTE — Patient Instructions (Signed)
Please get back to heat for your back. You can also try over the counter lidocaine patches and diclofenac gel. If it is particularly bad, you can try aleve (limit to 2-3 times a week).

## 2023-07-02 NOTE — Assessment & Plan Note (Signed)
Does okay with methotrexate, IVIG, low dose prednisone

## 2023-07-02 NOTE — Progress Notes (Signed)
Subjective:    Patient ID: Bridget Romero, female    DOB: 1946/01/04, 77 y.o.   MRN: [id number]  HPI Here for follow up of diabetes and other chronic health conditions  Her back is better but still hsa pain Sh relates this to needing to lift her sister--for transfers Doesn't have time for PT---cares for sister, getting infusions for myositis still, etc (cares for her 1-2 weeks intermittently) Not using heat recently Takes tylenol regularly  Checking sugars daily--but ran out of strips Generally under 130 Tries to watch her eating  Myositis has been controlled  Current Outpatient Medications on File Prior to Visit  Medication Sig Dispense Refill   Accu-Chek FastClix Lancets MISC USE TO TEST BLOOD SUGAR TWICE DAILY DX E11.40 102 each 11   ACCU-CHEK SMARTVIEW test strip USE AS DIRECTED TO CHECK BLOOD SUGAR TWICE DAILY 100 each 6   acetaminophen (TYLENOL) 500 MG tablet Take 500 mg by mouth every 6 (six) hours as needed.     Alcohol Swabs (B-D SINGLE USE SWABS REGULAR) PADS Use to test blood sugar once daily dx: 250.00 100 each 3   BD SYRINGE SLIP TIP 25G X 5/8" 1 ML MISC      Blood Glucose Calibration (ACCU-CHEK AVIVA) SOLN Use as directed 1 each 3   Cholecalciferol (VITAMIN D) 1000 UNITS capsule Take 1,000 Units by mouth daily.     ferrous sulfate 325 (65 FE) MG tablet Take 325 mg by mouth daily with breakfast.     folic acid (FOLVITE) 1 MG tablet Take 1 mg by mouth daily.     gabapentin (NEURONTIN) 300 MG capsule Take 1 capsule by mouth 3 (three) times daily.     Insulin Syringe-Needle U-100 31G X 5/16" 1 ML MISC by Does not apply route.     LANTUS 100 UNIT/ML injection INJECT 10 TO 20 UNITS INTO THE SKIN DAILY AS DIRECTED 10 mL 3   lisinopril (ZESTRIL) 10 MG tablet TAKE ONE TABLET BY MOUTH ONCE A DAY 90 tablet 3   magnesium oxide (MAG-OX) 400 (240 Mg) MG tablet Take 400 mg by mouth daily.     metFORMIN (GLUCOPHAGE) 1000 MG tablet TAKE 1 TABLET BY MOUTH TWICE A DAY 180 tablet  3   methotrexate 250 MG/10ML injection Inject into the skin.      Multiple Vitamin (MULTIVITAMIN) tablet Take 1 tablet by mouth daily.     nystatin (MYCOSTATIN/NYSTOP) powder Apply 1 Application topically 3 (three) times daily. 60 g 2   predniSONE (DELTASONE) 2.5 MG tablet Take 2.5 mg by mouth daily.     tiZANidine (ZANAFLEX) 2 MG tablet Take 1-2 tablets (2-4 mg total) by mouth at bedtime as needed for muscle spasms. 30 tablet 0   trospium (SANCTURA) 20 MG tablet Take 1 tab 1 hour prior to bedtime 30 tablet 11   TRUEPLUS INSULIN SYRINGE 31G X 5/16" 0.5 ML MISC USE ONE SYRINGE DAILY TO INJECT LANTUS UNDER THE SKIN. 100 each 4   No current facility-administered medications on file prior to visit.    Allergies  Allergen Reactions   Atorvastatin Other (See Comments)    myalgias   Pravastatin Other (See Comments)    Muscle pain    Past Medical History:  Diagnosis Date   Allergy    Asthma    Diabetes mellitus    Hyperlipidemia    Hypertension    Neuromuscular disorder (HCC)    polymyositis   Osteoarthritis    Vitamin B12 deficiency  Past Surgical History:  Procedure Laterality Date   COLONOSCOPY WITH PROPOFOL N/A 03/07/2016   Procedure: COLONOSCOPY WITH PROPOFOL;  Surgeon: Christena Deem, MD;  Location: [facility] ENDOSCOPY;  Service: Endoscopy;  Laterality: N/A;    Family History  Problem Relation Age of Onset   Heart disease Mother    Cancer Father    Breast cancer Neg Hx     Social History   Socioeconomic History   Marital status: Widowed    Spouse name: Not on file   Number of children: 2   Years of education: Not on file   Highest education level: Not on file  Occupational History   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 [employer]    Comment:    Tobacco Use   Smoking status: Former    Passive exposure: Never   Smokeless tobacco: Never   Tobacco comments:    age 12 when stopped smoking  Vaping Use   Vaping Use: Never used  Substance and Sexual  Activity   Alcohol use: No   Drug use: No   Sexual activity: Not on file  Other Topics Concern   Not on file  Social History Narrative   has living will   Son and daughter should make health care decisions for her   Would accept resuscitation   Not sure about tube feeds   Social Determinants of Health   Financial Resource Strain: Not on file  Food Insecurity: Not on file  Transportation Needs: Not on file  Physical Activity: Not on file  Stress: Not on file  Social Connections: Not on file  Intimate Partner Violence: Not on file   Review of Systems Weight is down some Doing more stairs at sister's home    Objective:   Physical Exam Constitutional:      Appearance: Normal appearance.  Cardiovascular:     Rate and Rhythm: Normal rate and regular rhythm.     Pulses: Normal pulses.     Heart sounds: No murmur heard.    No gallop.  Pulmonary:     Effort: Pulmonary effort is normal.     Breath sounds: Normal breath sounds. No wheezing or rales.  Musculoskeletal:     Cervical back: Neck supple.     Right lower leg: No edema.     Left lower leg: No edema.  Lymphadenopathy:     Cervical: No cervical adenopathy.  Neurological:     Mental Status: She is alert.            Assessment & Plan:

## 2023-07-03 ENCOUNTER — Ambulatory Visit
Admission: RE | Admit: 2023-07-03 | Discharge: 2023-07-03 | Disposition: A | Discharge status: Home / Self Care | Payer: Medicare PPO | Source: Ambulatory Visit | Attending: Rheumatology | Admitting: Rheumatology

## 2023-07-03 DIAGNOSIS — M3320 Polymyositis, organ involvement unspecified: Secondary | ICD-10-CM | POA: Insufficient documentation

## 2023-07-03 MED ORDER — DIPHENHYDRAMINE HCL 25 MG PO CAPS
25.0000 mg | ORAL_CAPSULE | Freq: Once | ORAL | Status: AC
  Administered 2023-07-03: 25 mg via ORAL

## 2023-07-03 MED ORDER — METHYLPREDNISOLONE SODIUM SUCC 40 MG IJ SOLR
INTRAMUSCULAR | Status: AC
  Filled 2023-07-03: qty 1

## 2023-07-03 MED ORDER — METHYLPREDNISOLONE SODIUM SUCC 40 MG IJ SOLR
10.0000 mg | Freq: Once | INTRAMUSCULAR | Status: AC
  Administered 2023-07-03: 10 mg via INTRAVENOUS

## 2023-07-03 MED ORDER — ACETAMINOPHEN 325 MG PO TABS
650.0000 mg | ORAL_TABLET | Freq: Once | ORAL | Status: AC
  Administered 2023-07-03: 650 mg via ORAL

## 2023-07-03 MED ORDER — STERILE WATER FOR INJECTION IJ SOLN
INTRAMUSCULAR | Status: AC
  Filled 2023-07-03: qty 10

## 2023-07-03 MED ORDER — IMMUNE GLOBULIN (HUMAN) 10 GM/100ML IV SOLN
400.0000 mg/kg | Freq: Once | INTRAVENOUS | Status: AC
  Administered 2023-07-03: 30 g via INTRAVENOUS
  Filled 2023-07-03: qty 300

## 2023-07-04 ENCOUNTER — Ambulatory Visit
Admission: RE | Admit: 2023-07-04 | Discharge: 2023-07-04 | Disposition: A | Discharge status: Home / Self Care | Payer: Medicare PPO | Source: Ambulatory Visit | Attending: Rheumatology | Admitting: Rheumatology

## 2023-07-04 DIAGNOSIS — M3320 Polymyositis, organ involvement unspecified: Secondary | ICD-10-CM | POA: Insufficient documentation

## 2023-07-04 MED ORDER — ACETAMINOPHEN 325 MG PO TABS
ORAL_TABLET | ORAL | Status: AC
  Filled 2023-07-04: qty 2

## 2023-07-04 MED ORDER — IMMUNE GLOBULIN (HUMAN) 10 GM/100ML IV SOLN
400.0000 mg/kg | Freq: Once | INTRAVENOUS | Status: AC
  Administered 2023-07-04: 30 g via INTRAVENOUS
  Filled 2023-07-04: qty 300

## 2023-07-04 MED ORDER — ACETAMINOPHEN 325 MG PO TABS
650.0000 mg | ORAL_TABLET | Freq: Once | ORAL | Status: AC
  Administered 2023-07-04: 650 mg via ORAL

## 2023-07-04 MED ORDER — METHYLPREDNISOLONE SODIUM SUCC 40 MG IJ SOLR
INTRAMUSCULAR | Status: AC
  Filled 2023-07-04: qty 1

## 2023-07-04 MED ORDER — DIPHENHYDRAMINE HCL 25 MG PO CAPS
25.0000 mg | ORAL_CAPSULE | Freq: Once | ORAL | Status: AC
  Administered 2023-07-04: 25 mg via ORAL

## 2023-07-04 MED ORDER — DIPHENHYDRAMINE HCL 25 MG PO CAPS
ORAL_CAPSULE | ORAL | Status: AC
  Filled 2023-07-04: qty 1

## 2023-07-04 MED ORDER — METHYLPREDNISOLONE SODIUM SUCC 40 MG IJ SOLR
10.0000 mg | Freq: Once | INTRAMUSCULAR | Status: AC
  Administered 2023-07-04: 10 mg via INTRAVENOUS

## 2023-07-05 ENCOUNTER — Ambulatory Visit
Admission: RE | Admit: 2023-07-05 | Discharge: 2023-07-05 | Disposition: A | Discharge status: Home / Self Care | Payer: Medicare PPO | Source: Ambulatory Visit | Attending: Rheumatology | Admitting: Rheumatology

## 2023-07-05 DIAGNOSIS — M3320 Polymyositis, organ involvement unspecified: Secondary | ICD-10-CM | POA: Insufficient documentation

## 2023-07-05 MED ORDER — IMMUNE GLOBULIN (HUMAN) 10 GM/100ML IV SOLN
400.0000 mg/kg | Freq: Once | INTRAVENOUS | Status: AC
  Administered 2023-07-05: 30 g via INTRAVENOUS
  Filled 2023-07-05: qty 300

## 2023-07-05 MED ORDER — DIPHENHYDRAMINE HCL 25 MG PO CAPS
25.0000 mg | ORAL_CAPSULE | Freq: Once | ORAL | Status: AC
  Administered 2023-07-05: 25 mg via ORAL

## 2023-07-05 MED ORDER — METHYLPREDNISOLONE SODIUM SUCC 40 MG IJ SOLR
10.0000 mg | Freq: Once | INTRAMUSCULAR | Status: AC
  Administered 2023-07-05: 10 mg via INTRAVENOUS

## 2023-07-05 MED ORDER — ACETAMINOPHEN 325 MG PO TABS
650.0000 mg | ORAL_TABLET | Freq: Once | ORAL | Status: AC
  Administered 2023-07-05: 650 mg via ORAL

## 2023-07-05 MED ORDER — ACETAMINOPHEN 325 MG PO TABS
ORAL_TABLET | ORAL | Status: AC
  Filled 2023-07-05: qty 2

## 2023-07-05 MED ORDER — DIPHENHYDRAMINE HCL 25 MG PO CAPS
ORAL_CAPSULE | ORAL | Status: AC
  Filled 2023-07-05: qty 1

## 2023-07-05 MED ORDER — METHYLPREDNISOLONE SODIUM SUCC 40 MG IJ SOLR
INTRAMUSCULAR | Status: AC
  Filled 2023-07-05: qty 1

## 2023-07-06 ENCOUNTER — Ambulatory Visit
Admission: RE | Admit: 2023-07-06 | Discharge: 2023-07-06 | Disposition: A | Discharge status: Home / Self Care | Payer: Medicare PPO | Source: Ambulatory Visit | Attending: Rheumatology | Admitting: Rheumatology

## 2023-07-06 DIAGNOSIS — M3320 Polymyositis, organ involvement unspecified: Secondary | ICD-10-CM | POA: Diagnosis not present

## 2023-07-06 MED ORDER — ACETAMINOPHEN 325 MG PO TABS
650.0000 mg | ORAL_TABLET | Freq: Once | ORAL | Status: AC
  Administered 2023-07-06: 650 mg via ORAL

## 2023-07-06 MED ORDER — DIPHENHYDRAMINE HCL 25 MG PO CAPS
25.0000 mg | ORAL_CAPSULE | Freq: Once | ORAL | Status: AC
  Administered 2023-07-06: 25 mg via ORAL

## 2023-07-06 MED ORDER — METHYLPREDNISOLONE SODIUM SUCC 40 MG IJ SOLR
INTRAMUSCULAR | Status: AC
  Filled 2023-07-06: qty 1

## 2023-07-06 MED ORDER — DIPHENHYDRAMINE HCL 25 MG PO CAPS
ORAL_CAPSULE | ORAL | Status: AC
  Filled 2023-07-06: qty 1

## 2023-07-06 MED ORDER — IMMUNE GLOBULIN (HUMAN) 10 GM/100ML IV SOLN
400.0000 mg/kg | Freq: Once | INTRAVENOUS | Status: AC
  Administered 2023-07-06: 30 g via INTRAVENOUS
  Filled 2023-07-06: qty 300

## 2023-07-06 MED ORDER — METHYLPREDNISOLONE SODIUM SUCC 40 MG IJ SOLR
10.0000 mg | Freq: Once | INTRAMUSCULAR | Status: AC
  Administered 2023-07-06: 10 mg via INTRAVENOUS

## 2023-07-06 MED ORDER — ACETAMINOPHEN 325 MG PO TABS
ORAL_TABLET | ORAL | Status: AC
  Filled 2023-07-06: qty 2

## 2023-07-11 MED FILL — Immune Globulin (Human) IV Soln 10 GM/100ML: INTRAVENOUS | Qty: 100 | Status: AC

## 2023-07-11 MED FILL — Immune Globulin (Human) IV Soln 20 GM/200ML: INTRAVENOUS | Qty: 200 | Status: AC

## 2023-07-23 ENCOUNTER — Other Ambulatory Visit: Payer: Self-pay | Admitting: Internal Medicine

## 2023-07-30 DIAGNOSIS — E119 Type 2 diabetes mellitus without complications: Secondary | ICD-10-CM | POA: Diagnosis not present

## 2023-07-30 DIAGNOSIS — H2513 Age-related nuclear cataract, bilateral: Secondary | ICD-10-CM | POA: Diagnosis not present

## 2023-07-30 DIAGNOSIS — Z0100 Encounter for examination of eyes and vision without abnormal findings: Secondary | ICD-10-CM | POA: Diagnosis not present

## 2023-07-30 LAB — HM DIABETES EYE EXAM

## 2023-08-01 DIAGNOSIS — Z7960 Long term (current) use of unspecified immunomodulators and immunosuppressants: Secondary | ICD-10-CM | POA: Diagnosis not present

## 2023-08-01 DIAGNOSIS — M5136 Other intervertebral disc degeneration, lumbar region: Secondary | ICD-10-CM | POA: Diagnosis not present

## 2023-08-01 DIAGNOSIS — M3322 Polymyositis with myopathy: Secondary | ICD-10-CM | POA: Diagnosis not present

## 2023-08-28 ENCOUNTER — Ambulatory Visit
Admission: RE | Admit: 2023-08-28 | Discharge: 2023-08-28 | Disposition: A | Discharge status: Home / Self Care | Payer: Medicare PPO | Source: Ambulatory Visit | Attending: Rheumatology | Admitting: Rheumatology

## 2023-08-28 DIAGNOSIS — M3320 Polymyositis, organ involvement unspecified: Secondary | ICD-10-CM | POA: Insufficient documentation

## 2023-08-28 MED ORDER — METHYLPREDNISOLONE SODIUM SUCC 40 MG IJ SOLR
INTRAMUSCULAR | Status: AC
  Filled 2023-08-28: qty 1

## 2023-08-28 MED ORDER — DIPHENHYDRAMINE HCL 25 MG PO CAPS
ORAL_CAPSULE | ORAL | Status: AC
  Filled 2023-08-28: qty 1

## 2023-08-28 MED ORDER — DIPHENHYDRAMINE HCL 25 MG PO CAPS
25.0000 mg | ORAL_CAPSULE | Freq: Once | ORAL | Status: AC
  Administered 2023-08-28: 25 mg via ORAL

## 2023-08-28 MED ORDER — ACETAMINOPHEN 325 MG PO TABS
ORAL_TABLET | ORAL | Status: AC
  Filled 2023-08-28: qty 2

## 2023-08-28 MED ORDER — METHYLPREDNISOLONE SODIUM SUCC 40 MG IJ SOLR
10.0000 mg | Freq: Once | INTRAMUSCULAR | Status: AC
  Administered 2023-08-28: 10 mg via INTRAVENOUS

## 2023-08-28 MED ORDER — IMMUNE GLOBULIN (HUMAN) 10 GM/100ML IV SOLN
400.0000 mg/kg | INTRAVENOUS | Status: DC
  Filled 2023-08-28 (×2): qty 300

## 2023-08-28 MED ORDER — ACETAMINOPHEN 325 MG PO TABS
650.0000 mg | ORAL_TABLET | Freq: Once | ORAL | Status: AC
  Administered 2023-08-28: 650 mg via ORAL

## 2023-08-29 ENCOUNTER — Ambulatory Visit
Admission: RE | Admit: 2023-08-29 | Discharge: 2023-08-29 | Disposition: A | Discharge status: Home / Self Care | Payer: Medicare PPO | Source: Ambulatory Visit | Attending: Rheumatology | Admitting: Rheumatology

## 2023-08-29 DIAGNOSIS — Z7969 Long term (current) use of other immunomodulators and immunosuppressants: Secondary | ICD-10-CM | POA: Diagnosis not present

## 2023-08-29 DIAGNOSIS — M3320 Polymyositis, organ involvement unspecified: Secondary | ICD-10-CM | POA: Diagnosis not present

## 2023-08-29 MED ORDER — DIPHENHYDRAMINE HCL 25 MG PO CAPS
ORAL_CAPSULE | ORAL | Status: AC
  Filled 2023-08-29: qty 1

## 2023-08-29 MED ORDER — DIPHENHYDRAMINE HCL 25 MG PO CAPS
25.0000 mg | ORAL_CAPSULE | Freq: Once | ORAL | Status: AC
  Administered 2023-08-29: 25 mg via ORAL

## 2023-08-29 MED ORDER — IMMUNE GLOBULIN (HUMAN) 10 GM/100ML IV SOLN
30.0000 g | Freq: Once | INTRAVENOUS | Status: AC
  Administered 2023-08-29: 30 g via INTRAVENOUS
  Filled 2023-08-29: qty 300

## 2023-08-29 MED ORDER — ACETAMINOPHEN 325 MG PO TABS
ORAL_TABLET | ORAL | Status: AC
  Filled 2023-08-29: qty 2

## 2023-08-29 MED ORDER — ACETAMINOPHEN 325 MG PO TABS
650.0000 mg | ORAL_TABLET | Freq: Once | ORAL | Status: AC
  Administered 2023-08-29: 650 mg via ORAL

## 2023-08-29 MED ORDER — METHYLPREDNISOLONE SODIUM SUCC 40 MG IJ SOLR
INTRAMUSCULAR | Status: AC
  Filled 2023-08-29: qty 1

## 2023-08-29 MED ORDER — METHYLPREDNISOLONE SODIUM SUCC 40 MG IJ SOLR
10.0000 mg | Freq: Once | INTRAMUSCULAR | Status: AC
  Administered 2023-08-29: 10 mg via INTRAVENOUS

## 2023-08-29 MED FILL — Immune Globulin (Human) IV Soln 20 GM/200ML: INTRAVENOUS | Qty: 200 | Status: AC

## 2023-08-29 MED FILL — Immune Globulin (Human) IV Soln 10 GM/100ML: INTRAVENOUS | Qty: 100 | Status: AC

## 2023-08-30 ENCOUNTER — Ambulatory Visit
Admission: RE | Admit: 2023-08-30 | Discharge: 2023-08-30 | Disposition: A | Discharge status: Home / Self Care | Payer: Medicare PPO | Source: Ambulatory Visit | Attending: Rheumatology | Admitting: Rheumatology

## 2023-08-30 DIAGNOSIS — M3322 Polymyositis with myopathy: Secondary | ICD-10-CM | POA: Insufficient documentation

## 2023-08-30 MED ORDER — DIPHENHYDRAMINE HCL 25 MG PO CAPS
ORAL_CAPSULE | ORAL | Status: AC
  Filled 2023-08-30: qty 1

## 2023-08-30 MED ORDER — METHYLPREDNISOLONE SODIUM SUCC 40 MG IJ SOLR
10.0000 mg | Freq: Once | INTRAMUSCULAR | Status: AC
  Administered 2023-08-30: 10 mg via INTRAVENOUS

## 2023-08-30 MED ORDER — DIPHENHYDRAMINE HCL 25 MG PO CAPS
25.0000 mg | ORAL_CAPSULE | Freq: Once | ORAL | Status: AC
  Administered 2023-08-30: 25 mg via ORAL

## 2023-08-30 MED ORDER — METHYLPREDNISOLONE SODIUM SUCC 40 MG IJ SOLR
INTRAMUSCULAR | Status: AC
  Filled 2023-08-30: qty 1

## 2023-08-30 MED ORDER — IMMUNE GLOBULIN (HUMAN) 10 GM/100ML IV SOLN
30.0000 g | Freq: Once | INTRAVENOUS | Status: AC
  Administered 2023-08-30: 30 g via INTRAVENOUS
  Filled 2023-08-30: qty 300

## 2023-08-30 MED ORDER — ACETAMINOPHEN 325 MG PO TABS
ORAL_TABLET | ORAL | Status: AC
  Filled 2023-08-30: qty 2

## 2023-08-30 MED ORDER — ACETAMINOPHEN 325 MG PO TABS
650.0000 mg | ORAL_TABLET | Freq: Once | ORAL | Status: AC
  Administered 2023-08-30: 650 mg via ORAL

## 2023-08-31 ENCOUNTER — Ambulatory Visit
Admission: RE | Admit: 2023-08-31 | Discharge: 2023-08-31 | Disposition: A | Discharge status: Home / Self Care | Payer: Medicare PPO | Source: Ambulatory Visit | Attending: Rheumatology | Admitting: Rheumatology

## 2023-08-31 DIAGNOSIS — M3320 Polymyositis, organ involvement unspecified: Secondary | ICD-10-CM | POA: Diagnosis not present

## 2023-08-31 DIAGNOSIS — Z01818 Encounter for other preprocedural examination: Secondary | ICD-10-CM | POA: Insufficient documentation

## 2023-08-31 MED ORDER — DIPHENHYDRAMINE HCL 25 MG PO CAPS
ORAL_CAPSULE | ORAL | Status: AC
  Filled 2023-08-31: qty 1

## 2023-08-31 MED ORDER — METHYLPREDNISOLONE SODIUM SUCC 40 MG IJ SOLR
INTRAMUSCULAR | Status: AC
  Filled 2023-08-31: qty 1

## 2023-08-31 MED ORDER — ACETAMINOPHEN 325 MG PO TABS
ORAL_TABLET | ORAL | Status: AC
  Filled 2023-08-31: qty 2

## 2023-08-31 MED ORDER — ACETAMINOPHEN 325 MG PO TABS
650.0000 mg | ORAL_TABLET | Freq: Once | ORAL | Status: AC
  Administered 2023-08-31: 650 mg via ORAL

## 2023-08-31 MED ORDER — IMMUNE GLOBULIN (HUMAN) 10 GM/100ML IV SOLN
30.0000 g | Freq: Once | INTRAVENOUS | Status: AC
  Administered 2023-08-31: 30 g via INTRAVENOUS
  Filled 2023-08-31: qty 300

## 2023-08-31 MED ORDER — DIPHENHYDRAMINE HCL 25 MG PO CAPS
25.0000 mg | ORAL_CAPSULE | Freq: Once | ORAL | Status: AC
  Administered 2023-08-31: 25 mg via ORAL

## 2023-08-31 MED ORDER — METHYLPREDNISOLONE SODIUM SUCC 40 MG IJ SOLR
10.0000 mg | Freq: Once | INTRAMUSCULAR | Status: AC
  Administered 2023-08-31: 10 mg via INTRAVENOUS

## 2023-10-11 NOTE — Addendum Note (Signed)
Encounter addended by: Rosalita Chessman, RN on: 10/11/2023 2:07 PM  Actions taken: Care Plan modified, MAR administration edited, MAR administration accepted

## 2023-10-12 MED FILL — Immune Globulin (Human) IV Soln 20 GM/200ML: INTRAVENOUS | Qty: 200 | Status: AC

## 2023-10-12 MED FILL — Immune Globulin (Human) IV Soln 10 GM/100ML: INTRAVENOUS | Qty: 100 | Status: AC

## 2023-10-23 ENCOUNTER — Ambulatory Visit
Admission: RE | Admit: 2023-10-23 | Discharge: 2023-10-23 | Disposition: A | Discharge status: Home / Self Care | Payer: Medicare PPO | Source: Ambulatory Visit | Attending: Rheumatology | Admitting: Rheumatology

## 2023-10-23 DIAGNOSIS — M3322 Polymyositis with myopathy: Secondary | ICD-10-CM | POA: Diagnosis not present

## 2023-10-23 MED ORDER — DIPHENHYDRAMINE HCL 25 MG PO CAPS
ORAL_CAPSULE | ORAL | Status: AC
  Filled 2023-10-23: qty 1

## 2023-10-23 MED ORDER — ACETAMINOPHEN 325 MG PO TABS
650.0000 mg | ORAL_TABLET | Freq: Once | ORAL | Status: AC
  Administered 2023-10-23: 650 mg via ORAL

## 2023-10-23 MED ORDER — ACETAMINOPHEN 325 MG PO TABS
ORAL_TABLET | ORAL | Status: AC
  Filled 2023-10-23: qty 2

## 2023-10-23 MED ORDER — METHYLPREDNISOLONE SODIUM SUCC 40 MG IJ SOLR
INTRAMUSCULAR | Status: AC
Start: 2023-10-23 — End: ?
  Filled 2023-10-23: qty 1

## 2023-10-23 MED ORDER — DIPHENHYDRAMINE HCL 25 MG PO CAPS
25.0000 mg | ORAL_CAPSULE | Freq: Once | ORAL | Status: AC
  Administered 2023-10-23: 25 mg via ORAL

## 2023-10-23 MED ORDER — IMMUNE GLOBULIN (HUMAN) 10 GM/100ML IV SOLN
400.0000 mg/kg | Freq: Once | INTRAVENOUS | Status: AC
  Administered 2023-10-23: 30 g via INTRAVENOUS
  Filled 2023-10-23: qty 300

## 2023-10-23 MED ORDER — METHYLPREDNISOLONE SODIUM SUCC 40 MG IJ SOLR
10.0000 mg | Freq: Once | INTRAMUSCULAR | Status: AC
  Administered 2023-10-23: 10 mg via INTRAVENOUS

## 2023-10-24 ENCOUNTER — Ambulatory Visit
Admission: RE | Admit: 2023-10-24 | Discharge: 2023-10-24 | Disposition: A | Discharge status: Home / Self Care | Payer: Medicare PPO | Source: Ambulatory Visit | Attending: Internal Medicine | Admitting: Internal Medicine

## 2023-10-24 DIAGNOSIS — M3320 Polymyositis, organ involvement unspecified: Secondary | ICD-10-CM | POA: Insufficient documentation

## 2023-10-24 MED ORDER — DIPHENHYDRAMINE HCL 25 MG PO CAPS
ORAL_CAPSULE | ORAL | Status: AC
  Filled 2023-10-24: qty 1

## 2023-10-24 MED ORDER — METHYLPREDNISOLONE SODIUM SUCC 40 MG IJ SOLR
10.0000 mg | Freq: Once | INTRAMUSCULAR | Status: AC
  Administered 2023-10-24: 10 mg via INTRAVENOUS

## 2023-10-24 MED ORDER — METHYLPREDNISOLONE SODIUM SUCC 40 MG IJ SOLR
INTRAMUSCULAR | Status: AC
  Filled 2023-10-24: qty 1

## 2023-10-24 MED ORDER — ACETAMINOPHEN 325 MG PO TABS
650.0000 mg | ORAL_TABLET | Freq: Once | ORAL | Status: AC
  Administered 2023-10-24: 650 mg via ORAL

## 2023-10-24 MED ORDER — IMMUNE GLOBULIN (HUMAN) 20 GM/200ML IV SOLN
35.0000 g | Freq: Once | INTRAVENOUS | Status: AC
  Administered 2023-10-24: 35 g via INTRAVENOUS
  Filled 2023-10-24 (×2): qty 350
  Filled 2023-10-24: qty 400

## 2023-10-24 MED ORDER — ACETAMINOPHEN 325 MG PO TABS
ORAL_TABLET | ORAL | Status: AC
  Filled 2023-10-24: qty 2

## 2023-10-24 MED ORDER — DIPHENHYDRAMINE HCL 25 MG PO CAPS
25.0000 mg | ORAL_CAPSULE | Freq: Once | ORAL | Status: AC
  Administered 2023-10-24: 25 mg via ORAL

## 2023-10-25 ENCOUNTER — Ambulatory Visit
Admission: RE | Admit: 2023-10-25 | Discharge: 2023-10-25 | Disposition: A | Discharge status: Home / Self Care | Payer: Medicare PPO | Source: Ambulatory Visit | Attending: Internal Medicine | Admitting: Internal Medicine

## 2023-10-25 DIAGNOSIS — M3320 Polymyositis, organ involvement unspecified: Secondary | ICD-10-CM | POA: Diagnosis not present

## 2023-10-25 MED ORDER — ACETAMINOPHEN 325 MG PO TABS
ORAL_TABLET | ORAL | Status: AC
Start: 2023-10-25 — End: ?
  Filled 2023-10-25: qty 2

## 2023-10-25 MED ORDER — IMMUNE GLOBULIN (HUMAN) 10 GM/100ML IV SOLN
400.0000 mg/kg | Freq: Once | INTRAVENOUS | Status: AC
  Administered 2023-10-25: 35 g via INTRAVENOUS
  Filled 2023-10-25: qty 350

## 2023-10-25 MED ORDER — DIPHENHYDRAMINE HCL 25 MG PO CAPS
ORAL_CAPSULE | ORAL | Status: AC
  Filled 2023-10-25: qty 1

## 2023-10-25 MED ORDER — ACETAMINOPHEN 325 MG PO TABS
650.0000 mg | ORAL_TABLET | Freq: Once | ORAL | Status: AC
  Administered 2023-10-25: 650 mg via ORAL

## 2023-10-25 MED ORDER — DIPHENHYDRAMINE HCL 25 MG PO CAPS
25.0000 mg | ORAL_CAPSULE | Freq: Once | ORAL | Status: AC
  Administered 2023-10-25: 25 mg via ORAL

## 2023-10-25 MED ORDER — METHYLPREDNISOLONE SODIUM SUCC 40 MG IJ SOLR
INTRAMUSCULAR | Status: AC
  Filled 2023-10-25: qty 1

## 2023-10-25 MED ORDER — METHYLPREDNISOLONE SODIUM SUCC 40 MG IJ SOLR
10.0000 mg | Freq: Once | INTRAMUSCULAR | Status: AC
  Administered 2023-10-25: 10 mg via INTRAVENOUS

## 2023-10-25 MED FILL — Immune Globulin (Human) IV Soln 10 GM/100ML: INTRAVENOUS | Qty: 100 | Status: AC

## 2023-10-25 MED FILL — Immune Globulin (Human) IV Soln 5 GM/50ML: INTRAVENOUS | Qty: 50 | Status: AC

## 2023-10-25 MED FILL — Immune Globulin (Human) IV Soln 20 GM/200ML: INTRAVENOUS | Qty: 200 | Status: AC

## 2023-10-26 ENCOUNTER — Other Ambulatory Visit: Payer: Self-pay | Admitting: Internal Medicine

## 2023-10-26 ENCOUNTER — Ambulatory Visit
Admission: RE | Admit: 2023-10-26 | Discharge: 2023-10-26 | Disposition: A | Discharge status: Home / Self Care | Payer: Medicare PPO | Source: Ambulatory Visit | Attending: Internal Medicine | Admitting: Internal Medicine

## 2023-10-26 DIAGNOSIS — M3320 Polymyositis, organ involvement unspecified: Secondary | ICD-10-CM | POA: Diagnosis not present

## 2023-10-26 MED ORDER — DIPHENHYDRAMINE HCL 25 MG PO CAPS
ORAL_CAPSULE | ORAL | Status: AC
  Filled 2023-10-26: qty 1

## 2023-10-26 MED ORDER — METHYLPREDNISOLONE SODIUM SUCC 40 MG IJ SOLR
INTRAMUSCULAR | Status: AC
  Filled 2023-10-26: qty 1

## 2023-10-26 MED ORDER — ACETAMINOPHEN 325 MG PO TABS
650.0000 mg | ORAL_TABLET | Freq: Once | ORAL | Status: AC
  Administered 2023-10-26: 650 mg via ORAL

## 2023-10-26 MED ORDER — ACETAMINOPHEN 325 MG PO TABS
ORAL_TABLET | ORAL | Status: AC
  Filled 2023-10-26: qty 2

## 2023-10-26 MED ORDER — IMMUNE GLOBULIN (HUMAN) 5 GM/50ML IV SOLN
35.0000 g | INTRAVENOUS | Status: DC
  Administered 2023-10-26: 35 g via INTRAVENOUS
  Filled 2023-10-26 (×2): qty 300

## 2023-10-26 MED ORDER — METHYLPREDNISOLONE SODIUM SUCC 40 MG IJ SOLR
10.0000 mg | Freq: Once | INTRAMUSCULAR | Status: AC
  Administered 2023-10-26: 10 mg via INTRAVENOUS

## 2023-10-26 MED ORDER — DIPHENHYDRAMINE HCL 25 MG PO CAPS
25.0000 mg | ORAL_CAPSULE | Freq: Once | ORAL | Status: AC
  Administered 2023-10-26: 25 mg via ORAL

## 2023-11-01 ENCOUNTER — Encounter: Payer: Self-pay | Admitting: Physician Assistant

## 2023-11-01 ENCOUNTER — Ambulatory Visit: Payer: Medicare PPO | Admitting: Physician Assistant

## 2023-11-01 VITALS — BP 157/79 | HR 80

## 2023-11-01 DIAGNOSIS — R351 Nocturia: Secondary | ICD-10-CM

## 2023-11-01 LAB — BLADDER SCAN AMB NON-IMAGING: Scan Result: 11

## 2023-11-01 MED ORDER — TROSPIUM CHLORIDE 20 MG PO TABS: ORAL_TABLET | ORAL | 11 refills | Status: DC

## 2023-11-01 NOTE — Progress Notes (Signed)
11/01/2023 9:39 AM   Bridget Romero 03/31/1946 [id number]  CC: Chief Complaint  Patient presents with   Follow-up    Nocturia    HPI: Bridget Romero is a 77 y.o. female with PMH nocturia on trospium 20 mg nightly who presents today for annual follow-up.   Today she reports she continues to do quite well on trospium.  She reports nocturia x 0-1.  She denies constipation, dry mouth, dry eye, gross hematuria, or dysuria.  PVR 11mL.  PMH: Past Medical History:  Diagnosis Date   Allergy    Asthma    Diabetes mellitus    Hyperlipidemia    Hypertension    Neuromuscular disorder (HCC)    polymyositis   Osteoarthritis    Vitamin B12 deficiency     Surgical History: Past Surgical History:  Procedure Laterality Date   COLONOSCOPY WITH PROPOFOL N/A 03/07/2016   Procedure: COLONOSCOPY WITH PROPOFOL;  Surgeon: Christena Deem, MD;  Location: [facility] ENDOSCOPY;  Service: Endoscopy;  Laterality: N/A;    Home Medications:  Allergies as of 11/01/2023       Reactions   Atorvastatin Other (See Comments)   myalgias   Pravastatin Other (See Comments)   Muscle pain        Medication List        Accurate as of November 01, 2023  9:39 AM. If you have any questions, ask your nurse or doctor.          Accu-Chek Aviva Soln Use as directed   Accu-Chek FastClix Lancets Misc USE TO TEST BLOOD SUGAR TWICE DAILY DX E11.40   Accu-Chek SmartView test strip Generic drug: glucose blood USE AS DIRECTED TO CHECK BLOOD SUGAR TWICE DAILY   acetaminophen 500 MG tablet Commonly known as: TYLENOL Take 500 mg by mouth every 6 (six) hours as needed.   B-D SINGLE USE SWABS REGULAR Pads Use to test blood sugar once daily dx: 250.00   BD Syringe Slip Tip 25G X 5/8" 1 ML Misc Generic drug: TUBERCULIN SYR 1CC/25GX5/8"   ferrous sulfate 325 (65 FE) MG tablet Take 325 mg by mouth daily with breakfast.   folic acid 1 MG tablet Commonly known as: FOLVITE Take 1 mg by mouth  daily.   gabapentin 300 MG capsule Commonly known as: NEURONTIN Take 1 capsule by mouth 3 (three) times daily.   Insulin Syringe-Needle U-100 31G X 5/16" 1 ML Misc by Does not apply route.   TRUEplus Insulin Syringe 31G X 5/16" 0.5 ML Misc Generic drug: Insulin Syringe-Needle U-100 USE ONE SYRINGE DAILY TO INJECT LANTUS UNDER THE SKIN.   Lantus 100 UNIT/ML injection Generic drug: insulin glargine INJECT TEN TO 20 UNITS INTO THE SKIN DAILY AS DIRECTED (VIAL IS GOOD FOR 28 DAYS AFTER FIRST USE)   lisinopril 10 MG tablet Commonly known as: ZESTRIL TAKE ONE TABLET BY MOUTH ONCE A DAY   magnesium oxide 400 (240 Mg) MG tablet Commonly known as: MAG-OX Take 400 mg by mouth daily.   metFORMIN 1000 MG tablet Commonly known as: GLUCOPHAGE TAKE 1 TABLET BY MOUTH TWICE A DAY   methotrexate 250 MG/10ML injection Inject into the skin.   multivitamin tablet Take 1 tablet by mouth daily.   nystatin powder Commonly known as: MYCOSTATIN/NYSTOP Apply 1 Application topically 3 (three) times daily.   predniSONE 2.5 MG tablet Commonly known as: DELTASONE Take 2.5 mg by mouth daily.   tiZANidine 2 MG tablet Commonly known as: ZANAFLEX Take 1-2 tablets (2-4 mg total) by  mouth at bedtime as needed for muscle spasms.   trospium 20 MG tablet Commonly known as: SANCTURA Take 1 tab 1 hour prior to bedtime   Vitamin D 1000 units capsule Take 1,000 Units by mouth daily.        Allergies:  Allergies  Allergen Reactions   Atorvastatin Other (See Comments)    myalgias   Pravastatin Other (See Comments)    Muscle pain    Family History: Family History  Problem Relation Age of Onset   Heart disease Mother    Cancer Father    Breast cancer Neg Hx     Social History:   reports that she has quit smoking. She has never been exposed to tobacco smoke. She has never used smokeless tobacco. She reports that she does not drink alcohol and does not use drugs.  Physical Exam: BP (!)  157/79   Pulse 80   Constitutional:  Alert and oriented, no acute distress, nontoxic appearing HEENT: [place], AT Cardiovascular: No clubbing, cyanosis, or edema Respiratory: Normal respiratory effort, no increased work of breathing Skin: No rashes, bruises or suspicious lesions Neurologic: Grossly intact, no focal deficits, moving all 4 extremities Psychiatric: Normal mood and affect  Laboratory Data: Results for orders placed or performed in visit on 11/01/23  BLADDER SCAN AMB NON-IMAGING  Result Value Ref Range   Scan Result 11 ml    Assessment & Plan:   1. Nocturia Well-managed on nighttime trospium, will continue this.  No apparent side effects and she is emptying appropriately. - BLADDER SCAN AMB NON-IMAGING - trospium (SANCTURA) 20 MG tablet; Take 1 tab 1 hour prior to bedtime  Dispense: 30 tablet; Refill: 11  Return in about 1 year (around 10/31/2024) for Annual f/u with PVR.  Carman Ching, PA-C  [facility] [place] [address] [place], [place] [address] [phone]

## 2023-11-05 ENCOUNTER — Inpatient Hospital Stay: Payer: Medicare PPO

## 2023-11-05 ENCOUNTER — Inpatient Hospital Stay: Payer: Medicare PPO | Attending: Internal Medicine

## 2023-11-05 ENCOUNTER — Inpatient Hospital Stay (HOSPITAL_BASED_OUTPATIENT_CLINIC_OR_DEPARTMENT_OTHER): Payer: Medicare PPO | Admitting: Internal Medicine

## 2023-11-05 ENCOUNTER — Encounter: Payer: Self-pay | Admitting: Internal Medicine

## 2023-11-05 VITALS — BP 157/81 | HR 83 | Temp 96.9°F | Ht 66.0 in | Wt 185.4 lb

## 2023-11-05 DIAGNOSIS — M3320 Polymyositis, organ involvement unspecified: Secondary | ICD-10-CM | POA: Insufficient documentation

## 2023-11-05 DIAGNOSIS — Z87891 Personal history of nicotine dependence: Secondary | ICD-10-CM | POA: Diagnosis not present

## 2023-11-05 DIAGNOSIS — Z79899 Other long term (current) drug therapy: Secondary | ICD-10-CM | POA: Insufficient documentation

## 2023-11-05 DIAGNOSIS — Z7952 Long term (current) use of systemic steroids: Secondary | ICD-10-CM | POA: Diagnosis not present

## 2023-11-05 DIAGNOSIS — D649 Anemia, unspecified: Secondary | ICD-10-CM | POA: Insufficient documentation

## 2023-11-05 DIAGNOSIS — Z809 Family history of malignant neoplasm, unspecified: Secondary | ICD-10-CM | POA: Insufficient documentation

## 2023-11-05 LAB — IRON AND TIBC
Iron: 59 ug/dL (ref 28–170)
Saturation Ratios: 21 % (ref 10.4–31.8)
TIBC: 284 ug/dL (ref 250–450)
UIBC: 225 ug/dL

## 2023-11-05 LAB — LACTATE DEHYDROGENASE: LDH: 206 U/L — ABNORMAL HIGH (ref 98–192)

## 2023-11-05 LAB — FERRITIN: Ferritin: 84 ng/mL (ref 11–307)

## 2023-11-05 LAB — BASIC METABOLIC PANEL - CANCER CENTER ONLY
Anion gap: 7 (ref 5–15)
BUN: 21 mg/dL (ref 8–23)
CO2: 27 mmol/L (ref 22–32)
Calcium: 9.3 mg/dL (ref 8.9–10.3)
Chloride: 106 mmol/L (ref 98–111)
Creatinine: 0.67 mg/dL (ref 0.44–1.00)
GFR, Estimated: >60 mL/min (ref >60)
Glucose, Bld: 126 mg/dL — ABNORMAL HIGH (ref 70–99)
Potassium: 4.1 mmol/L (ref 3.5–5.1)
Sodium: 140 mmol/L (ref 135–145)

## 2023-11-05 LAB — CBC WITH DIFFERENTIAL (CANCER CENTER ONLY)
Abs Immature Granulocytes: 0.03 10*3/uL (ref 0.00–0.07)
Basophils Absolute: 0 10*3/uL (ref 0.0–0.1)
Basophils Relative: 0 %
Eosinophils Absolute: 0.1 10*3/uL (ref 0.0–0.5)
Eosinophils Relative: 1 %
HCT: 29.8 % — ABNORMAL LOW (ref 36.0–46.0)
Hemoglobin: 9.4 g/dL — ABNORMAL LOW (ref 12.0–15.0)
Immature Granulocytes: 0 %
Lymphocytes Relative: 16 %
Lymphs Abs: 1.1 10*3/uL (ref 0.7–4.0)
MCH: 29.5 pg (ref 26.0–34.0)
MCHC: 31.5 g/dL (ref 30.0–36.0)
MCV: 93.4 fL (ref 80.0–100.0)
Monocytes Absolute: 0.3 10*3/uL (ref 0.1–1.0)
Monocytes Relative: 4 %
Neutro Abs: 5.4 10*3/uL (ref 1.7–7.7)
Neutrophils Relative %: 79 %
Platelet Count: 280 10*3/uL (ref 150–400)
RBC: 3.19 MIL/uL — ABNORMAL LOW (ref 3.87–5.11)
RDW: 13.7 % (ref 11.5–15.5)
WBC Count: 6.8 10*3/uL (ref 4.0–10.5)
nRBC: 0 % (ref 0.0–0.2)

## 2023-11-05 LAB — FOLATE: Folate: >40 ng/mL (ref >5.9)

## 2023-11-05 LAB — VITAMIN B12: Vitamin B-12: 482 pg/mL (ref 180–914)

## 2023-11-05 NOTE — Assessment & Plan Note (Signed)
#   Normocytic anemia: chronic Hb ~9-10. continue gentle iron 1 pill a day. March 2024- I sat- 17%  # However today Hb 9-10  stable-  Unclear etiology; myeloma panel-MM-NEG; K/l= 1.7.  repeat penindg today. Discussed the possible bone marrow biopsy, but hold of for now.  No obvious concern for hemolysis.  # Polymyositis [Dr. Gavin Potters; Clinically, she is doing well on IVIG 2 g/kg every 6 weeks [Short stay; per Rheumatology]; also prednisone 2.5 mg a day and MTX 10 mg SQ weekly-  Dr.Patel; Rheum- stable.   # Disposition:  # no venofer today # follow up in 6 months- MD; cbc/bmp; LDH; Multiple myeloma panel; Kappa lamda chains; B12; folic acid; possible venofer- Dr.B.  Cc; Dr.Kernodle.

## 2023-11-05 NOTE — Progress Notes (Signed)
[facility] CONSULT NOTE  Patient Care Team: Karie Schwalbe, MD as PCP - General (Internal Medicine) Kandyce Rud., MD (Rheumatology) Earna Coder, MD as Consulting Physician (Internal Medicine)  CHIEF COMPLAINTS/PURPOSE OF CONSULTATION: Polymyositis/; Anemia  #Polymyositis [Dr. Gavin Potters; 2019; left arm biopsy]; low dose prednisone/methotrexate/IVIG infusions 400 mg kilogram daily x4 days every 6 weeks Reinaldo Raddle Rheum; Dr.Patel]  Oncology History   No history exists.    HISTORY OF PRESENTING ILLNESS: Alone.  Walks independently.  Bridget Romero 77 y.o.  female with a history of polymyositis currently on IVIG [as per Rheumatology] is here for follow-up of anemia.  Pt feels energy is good. Stools are black from oral iron. No nausea or diarrhea. She denies any blood in stools or black-colored stools.  Denies any blood in urine.  Patient denies any worsening weakness in arms or legs.  Complains of mild earache on the left side.  Review of Systems  Constitutional:  Positive for malaise/fatigue. Negative for chills, diaphoresis, fever and weight loss.  HENT:  Negative for nosebleeds and sore throat.   Eyes:  Negative for double vision.  Respiratory:  Negative for cough, hemoptysis, sputum production, shortness of breath and wheezing.   Cardiovascular:  Negative for chest pain, palpitations, orthopnea and leg swelling.  Gastrointestinal:  Negative for abdominal pain, blood in stool, constipation, diarrhea, heartburn, melena, nausea and vomiting.  Genitourinary:  Negative for dysuria, frequency and urgency.  Musculoskeletal:  Negative for back pain and joint pain.  Skin: Negative.  Negative for itching and rash.  Neurological:  Negative for dizziness, tingling, focal weakness, weakness and headaches.  Endo/Heme/Allergies:  Does not bruise/bleed easily.  Psychiatric/Behavioral:  Negative for depression. The patient is not nervous/anxious and does not  have insomnia.      MEDICAL HISTORY:  Past Medical History:  Diagnosis Date   Allergy    Asthma    Diabetes mellitus    Hyperlipidemia    Hypertension    Neuromuscular disorder (HCC)    polymyositis   Osteoarthritis    Vitamin B12 deficiency     SURGICAL HISTORY: Past Surgical History:  Procedure Laterality Date   COLONOSCOPY WITH PROPOFOL N/A 03/07/2016   Procedure: COLONOSCOPY WITH PROPOFOL;  Surgeon: Christena Deem, MD;  Location: [facility] ENDOSCOPY;  Service: Endoscopy;  Laterality: N/A;    SOCIAL HISTORY: Social History   Socioeconomic History   Marital status: Widowed    Spouse name: Not on file   Number of children: 2   Years of education: Not on file   Highest education level: Not on file  Occupational History   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 [employer]    Comment:    Tobacco Use   Smoking status: Former    Passive exposure: Never   Smokeless tobacco: Never   Tobacco comments:    age 16 when stopped smoking  Vaping Use   Vaping status: Never Used  Substance and Sexual Activity   Alcohol use: No   Drug use: No   Sexual activity: Not on file  Other Topics Concern   Not on file  Social History Narrative   has living will   Son and daughter should make health care decisions for her   Would accept resuscitation   Not sure about tube feeds   Social Determinants of Health   Financial Resource Strain: Not on file  Food Insecurity: Not on file  Transportation Needs: Not on file  Physical Activity: Not on file  Stress: Not on file  Social Connections: Not on file  Intimate Partner Violence: Not on file    FAMILY HISTORY: Family History  Problem Relation Age of Onset   Heart disease Mother    Cancer Father    Breast cancer Neg Hx     ALLERGIES:  is allergic to atorvastatin and pravastatin.  MEDICATIONS:  Current Outpatient Medications  Medication Sig Dispense Refill   Accu-Chek FastClix Lancets MISC USE TO TEST BLOOD SUGAR  TWICE DAILY DX E11.40 102 each 11   ACCU-CHEK SMARTVIEW test strip USE AS DIRECTED TO CHECK BLOOD SUGAR TWICE DAILY 100 each 6   acetaminophen (TYLENOL) 500 MG tablet Take 500 mg by mouth every 6 (six) hours as needed.     Alcohol Swabs (B-D SINGLE USE SWABS REGULAR) PADS Use to test blood sugar once daily dx: 250.00 100 each 3   BD SYRINGE SLIP TIP 25G X 5/8" 1 ML MISC      Blood Glucose Calibration (ACCU-CHEK AVIVA) SOLN Use as directed 1 each 3   Cholecalciferol (VITAMIN D) 1000 UNITS capsule Take 1,000 Units by mouth daily.     ferrous sulfate 325 (65 FE) MG tablet Take 325 mg by mouth daily with breakfast.     folic acid (FOLVITE) 1 MG tablet Take 1 mg by mouth daily.     gabapentin (NEURONTIN) 300 MG capsule Take 1 capsule by mouth 3 (three) times daily.     Insulin Syringe-Needle U-100 31G X 5/16" 1 ML MISC by Does not apply route.     LANTUS 100 UNIT/ML injection INJECT TEN TO 20 UNITS INTO THE SKIN DAILY AS DIRECTED (VIAL IS GOOD FOR 28 DAYS AFTER FIRST USE) 10 mL 3   lisinopril (ZESTRIL) 10 MG tablet TAKE ONE TABLET BY MOUTH ONCE A DAY 90 tablet 3   magnesium oxide (MAG-OX) 400 (240 Mg) MG tablet Take 400 mg by mouth daily.     metFORMIN (GLUCOPHAGE) 1000 MG tablet TAKE 1 TABLET BY MOUTH TWICE A DAY 180 tablet 3   methotrexate 250 MG/10ML injection Inject into the skin.      Multiple Vitamin (MULTIVITAMIN) tablet Take 1 tablet by mouth daily.     nystatin (MYCOSTATIN/NYSTOP) powder Apply 1 Application topically 3 (three) times daily. 60 g 2   predniSONE (DELTASONE) 2.5 MG tablet Take 2.5 mg by mouth daily.     tiZANidine (ZANAFLEX) 2 MG tablet Take 1-2 tablets (2-4 mg total) by mouth at bedtime as needed for muscle spasms. 30 tablet 0   trospium (SANCTURA) 20 MG tablet Take 1 tab 1 hour prior to bedtime 30 tablet 11   TRUEPLUS INSULIN SYRINGE 31G X 5/16" 0.5 ML MISC USE ONE SYRINGE DAILY TO INJECT LANTUS UNDER THE SKIN. 100 each 4   No current facility-administered medications for  this visit.      Marland Kitchen  PHYSICAL EXAMINATION: ECOG PERFORMANCE STATUS: 0 - Asymptomatic  Vitals:   11/05/23 1304 11/05/23 1315  BP: (!) 130/95 (!) 157/81  Pulse: 83   Temp: (!) 96.9 F (36.1 C)   SpO2: 100%    Filed Weights   11/05/23 1304  Weight: 185 lb 6.4 oz (84.1 kg)    Physical Exam Vitals and nursing note reviewed.  Constitutional:      Comments:    HENT:     Head: Normocephalic and atraumatic.     Mouth/Throat:     Pharynx: Oropharynx is clear.  Eyes:     Extraocular Movements: Extraocular movements intact.  Pupils: Pupils are equal, round, and reactive to light.  Cardiovascular:     Rate and Rhythm: Normal rate and regular rhythm.  Pulmonary:     Comments: Decreased breath sounds bilaterally.  Abdominal:     Palpations: Abdomen is soft.  Musculoskeletal:        General: Normal range of motion.     Cervical back: Normal range of motion.  Skin:    General: Skin is warm.  Neurological:     General: No focal deficit present.     Mental Status: She is alert and oriented to person, place, and time.  Psychiatric:        Behavior: Behavior normal.        Judgment: Judgment normal.     LABORATORY DATA:  I have reviewed the data as listed Lab Results  Component Value Date   WBC 6.8 11/05/2023   HGB 9.4 (L) 11/05/2023   HCT 29.8 (L) 11/05/2023   MCV 93.4 11/05/2023   PLT 280 11/05/2023   Recent Labs    11/21/22 0921 03/02/23 1300 05/08/23 1254 11/05/23 1256  NA 141 138 136 140  K 3.7 4.6 4.3 4.1  CL 108 108 104 106  CO2 27 24 24 27   GLUCOSE 160* 173* 177* 126*  BUN 21 20 20 21   CREATININE 0.80 0.77 0.86 0.67  CALCIUM 9.5 9.3 9.2 9.3  GFRNONAA >60 >60 >60 >60  PROT 7.0  --   --   --   ALBUMIN 3.8  --   --   --   AST 37  --   --   --   ALT 29  --   --   --   ALKPHOS 57  --   --   --   BILITOT 0.7  --   --   --     RADIOGRAPHIC STUDIES: I have personally reviewed the radiological images as listed and agreed with the findings in the  report. No results found.  ASSESSMENT & PLAN:   Symptomatic anemia # Normocytic anemia: chronic Hb ~9-10. continue gentle iron 1 pill a day. March 2024- I sat- 17%  # However today Hb 9-10  stable-  Unclear etiology; myeloma panel-MM-NEG; K/l= 1.7.  repeat penindg today. Discussed the possible bone marrow biopsy, but hold of for now.  No obvious concern for hemolysis.  # Polymyositis [Dr. Gavin Potters; Clinically, she is doing well on IVIG 2 g/kg every 6 weeks [Short stay; per Rheumatology]; also prednisone 2.5 mg a day and MTX 10 mg SQ weekly-  Dr.Patel; Rheum- stable.   # Disposition:  # no venofer today # follow up in 6 months- MD; cbc/bmp; LDH; Multiple myeloma panel; Kappa lamda chains; B12; folic acid; possible venofer- Dr.B.  Cc; Dr.Kernodle.   All questions were answered. The patient knows to call the clinic with any problems, questions or concerns.    Earna Coder, MD 11/05/2023 1:39 PM

## 2023-11-05 NOTE — Progress Notes (Signed)
Fatigue/weakness: yes Dyspena: no Light headedness: no Blood in stool: no   C/o left ear ache x1 week, 6/10. Can you take a look in her ear?

## 2023-11-06 LAB — KAPPA/LAMBDA LIGHT CHAINS
Kappa free light chain: 34.2 mg/L — ABNORMAL HIGH (ref 3.3–19.4)
Kappa, lambda light chain ratio: 1.73 — ABNORMAL HIGH (ref 0.26–1.65)
Lambda free light chains: 19.8 mg/L (ref 5.7–26.3)

## 2023-11-08 LAB — MULTIPLE MYELOMA PANEL, SERUM
Albumin SerPl Elph-Mcnc: 3.5 g/dL (ref 2.9–4.4)
Albumin/Glob SerPl: 0.9 (ref 0.7–1.7)
Alpha 1: 0.2 g/dL (ref 0.0–0.4)
Alpha2 Glob SerPl Elph-Mcnc: 0.6 g/dL (ref 0.4–1.0)
B-Globulin SerPl Elph-Mcnc: 0.9 g/dL (ref 0.7–1.3)
Gamma Glob SerPl Elph-Mcnc: 2.2 g/dL — ABNORMAL HIGH (ref 0.4–1.8)
Globulin, Total: 3.9 g/dL (ref 2.2–3.9)
IgA: 133 mg/dL (ref 64–422)
IgG (Immunoglobin G), Serum: 2362 mg/dL — ABNORMAL HIGH (ref 586–1602)
IgM (Immunoglobulin M), Srm: 86 mg/dL (ref 26–217)
Total Protein ELP: 7.4 g/dL (ref 6.0–8.5)

## 2023-11-13 DIAGNOSIS — M5116 Intervertebral disc disorders with radiculopathy, lumbar region: Secondary | ICD-10-CM | POA: Diagnosis not present

## 2023-11-13 DIAGNOSIS — Z7960 Long term (current) use of unspecified immunomodulators and immunosuppressants: Secondary | ICD-10-CM | POA: Diagnosis not present

## 2023-11-13 DIAGNOSIS — M3322 Polymyositis with myopathy: Secondary | ICD-10-CM | POA: Diagnosis not present

## 2023-11-30 ENCOUNTER — Other Ambulatory Visit: Payer: Self-pay | Admitting: Internal Medicine

## 2023-12-11 ENCOUNTER — Telehealth: Payer: Self-pay

## 2023-12-11 NOTE — Telephone Encounter (Signed)
Pt LM on triage line stating when she lays down at night she has a "tightness in her stomach" she states the tightness is better when she awakes in the morning, she questions if this is normal.  Called pt back, no answer. Unable to leave message as no voicemail picks up.

## 2023-12-18 ENCOUNTER — Ambulatory Visit: Payer: Medicare PPO

## 2023-12-27 ENCOUNTER — Encounter: Payer: Self-pay | Admitting: Internal Medicine

## 2023-12-27 NOTE — Telephone Encounter (Signed)
 Error

## 2024-01-01 LAB — HM DIABETES FOOT EXAM

## 2024-01-02 ENCOUNTER — Ambulatory Visit (INDEPENDENT_AMBULATORY_CARE_PROVIDER_SITE_OTHER): Payer: Medicare PPO | Admitting: Internal Medicine

## 2024-01-02 ENCOUNTER — Encounter: Payer: Self-pay | Admitting: Internal Medicine

## 2024-01-02 VITALS — BP 142/80 | HR 85 | Temp 98.0°F | Ht 66.5 in | Wt 184.2 lb

## 2024-01-02 DIAGNOSIS — M3322 Polymyositis with myopathy: Secondary | ICD-10-CM

## 2024-01-02 DIAGNOSIS — Z7984 Long term (current) use of oral hypoglycemic drugs: Secondary | ICD-10-CM

## 2024-01-02 DIAGNOSIS — N3281 Overactive bladder: Secondary | ICD-10-CM | POA: Diagnosis not present

## 2024-01-02 DIAGNOSIS — E119 Type 2 diabetes mellitus without complications: Secondary | ICD-10-CM | POA: Insufficient documentation

## 2024-01-02 DIAGNOSIS — E1140 Type 2 diabetes mellitus with diabetic neuropathy, unspecified: Secondary | ICD-10-CM

## 2024-01-02 DIAGNOSIS — Z794 Long term (current) use of insulin: Secondary | ICD-10-CM

## 2024-01-02 DIAGNOSIS — Z0000 Encounter for general adult medical examination without abnormal findings: Secondary | ICD-10-CM | POA: Diagnosis not present

## 2024-01-02 DIAGNOSIS — I10 Essential (primary) hypertension: Secondary | ICD-10-CM | POA: Diagnosis not present

## 2024-01-02 LAB — LIPID PANEL
Cholesterol: 159 mg/dL (ref 0–200)
HDL: 50.1 mg/dL (ref >39.00)
LDL Cholesterol: 90 mg/dL (ref 0–99)
NonHDL: 108.78
Total CHOL/HDL Ratio: 3
Triglycerides: 94 mg/dL (ref 0.0–149.0)
VLDL: 18.8 mg/dL (ref 0.0–40.0)

## 2024-01-02 LAB — COMPREHENSIVE METABOLIC PANEL
ALT: 33 U/L (ref 0–35)
AST: 32 U/L (ref 0–37)
Albumin: 4 g/dL (ref 3.5–5.2)
Alkaline Phosphatase: 56 U/L (ref 39–117)
BUN: 18 mg/dL (ref 6–23)
CO2: 32 meq/L (ref 19–32)
Calcium: 9.4 mg/dL (ref 8.4–10.5)
Chloride: 105 meq/L (ref 96–112)
Creatinine, Ser: 0.73 mg/dL (ref 0.40–1.20)
GFR: 80.23 mL/min (ref >60.00)
Glucose, Bld: 178 mg/dL — ABNORMAL HIGH (ref 70–99)
Potassium: 4.2 meq/L (ref 3.5–5.1)
Sodium: 142 meq/L (ref 135–145)
Total Bilirubin: 0.6 mg/dL (ref 0.2–1.2)
Total Protein: 6.4 g/dL (ref 6.0–8.3)

## 2024-01-02 LAB — CBC
HCT: 30.4 % — ABNORMAL LOW (ref 36.0–46.0)
Hemoglobin: 9.8 g/dL — ABNORMAL LOW (ref 12.0–15.0)
MCHC: 32.4 g/dL (ref 30.0–36.0)
MCV: 92.3 fL (ref 78.0–100.0)
Platelets: 300 10*3/uL (ref 150.0–400.0)
RBC: 3.29 Mil/uL — ABNORMAL LOW (ref 3.87–5.11)
RDW: 14.4 % (ref 11.5–15.5)
WBC: 6.2 10*3/uL (ref 4.0–10.5)

## 2024-01-02 LAB — MICROALBUMIN / CREATININE URINE RATIO
Creatinine,U: 66.1 mg/dL
Microalb Creat Ratio: 1.1 mg/g (ref 0.0–30.0)
Microalb, Ur: <0.7 mg/dL (ref 0.0–1.9)

## 2024-01-02 LAB — HEMOGLOBIN A1C: Hgb A1c MFr Bld: 7.5 % — ABNORMAL HIGH (ref 4.6–6.5)

## 2024-01-02 NOTE — Assessment & Plan Note (Signed)
 BP Readings from Last 3 Encounters:  01/02/24 (!) 142/80  11/05/23 (!) 157/81  11/01/23 (!) 157/79   Fair control on the lsinopril 10mg  daily

## 2024-01-02 NOTE — Assessment & Plan Note (Signed)
 Seems controlled with metformin 1000 bid Uses insulin 10-20 based on sugars (if elevated) Gabapentin 300 bid

## 2024-01-02 NOTE — Assessment & Plan Note (Signed)
 Satisfied with the trospium

## 2024-01-02 NOTE — Progress Notes (Signed)
 Subjective:    Patient ID: Bridget Romero, female    DOB: 1946/01/15, 78 y.o.   MRN: [id number]  HPI Here for Medicare wellness visit and follow up of chronic health conditions Reviewed advanced directives Reviewed other doctors---Dr Patel--rheumatology, Dr Jay, Dr Dingledein--ophthal, Dr Malena Vallaincourt--urology No hospitalizations or surgery in past year Occasional surgery Vision okay--early cataracts Hearing is good No alcohol or tobacco No falls Occasional down days--nothing consistent. No anhedonia Independent with instrumental ADLs No memory issues  Polymyositis has been controlled Some diarrhea at times--?related to magnesium Still gets gamma globulin infusions and low dose prednisone   Checks sugars regularly Highest 140 fasting Usually closer to 100 Some mild foot tingling--not painful  No chest pain or SOB No dizziness or syncope No edema No palpitations No headache  Still on bladder med per urology Helps with the incontinence  Current Outpatient Medications on File Prior to Visit  Medication Sig Dispense Refill   Accu-Chek FastClix Lancets MISC USE TO TEST BLOOD SUGAR TWICE DAILY DX E11.40 102 each 11   ACCU-CHEK SMARTVIEW test strip USE AS DIRECTED TO CHECK BLOOD SUGAR TWICE DAILY 100 each 6   acetaminophen  (TYLENOL ) 500 MG tablet Take 500 mg by mouth every 6 (six) hours as needed.     Alcohol Swabs (B-D SINGLE USE SWABS REGULAR) PADS Use to test blood sugar once daily dx: 250.00 100 each 3   BD SYRINGE SLIP TIP 25G X 5/8 1 ML MISC      Blood Glucose Calibration (ACCU-CHEK AVIVA) SOLN Use as directed 1 each 3   Cholecalciferol (VITAMIN D ) 1000 UNITS capsule Take 1,000 Units by mouth daily.     ferrous sulfate 325 (65 FE) MG tablet Take 325 mg by mouth daily with breakfast.     folic acid  (FOLVITE ) 1 MG tablet Take 1 mg by mouth daily.     gabapentin (NEURONTIN) 300 MG capsule Take 1 capsule by mouth 3 (three) times daily.      Insulin  Syringe-Needle U-100 31G X 5/16 1 ML MISC by Does not apply route.     LANTUS  100 UNIT/ML injection INJECT TEN TO 20 UNITS INTO THE SKIN DAILY AS DIRECTED (VIAL IS GOOD FOR 28 DAYS AFTER FIRST USE) 10 mL 3   lisinopril  (ZESTRIL ) 10 MG tablet TAKE ONE TABLET BY MOUTH ONCE A DAY 90 tablet 3   magnesium oxide (MAG-OX) 400 (240 Mg) MG tablet Take 400 mg by mouth daily.     metFORMIN  (GLUCOPHAGE ) 1000 MG tablet TAKE ONE TABLET BY MOUTH TWICE A DAY 180 tablet 3   methotrexate 250 MG/10ML injection Inject into the skin.      Multiple Vitamin (MULTIVITAMIN) tablet Take 1 tablet by mouth daily.     nystatin  (MYCOSTATIN /NYSTOP ) powder Apply 1 Application topically 3 (three) times daily. 60 g 2   predniSONE  (DELTASONE ) 2.5 MG tablet Take 2.5 mg by mouth daily.     tiZANidine  (ZANAFLEX ) 2 MG tablet Take 1-2 tablets (2-4 mg total) by mouth at bedtime as needed for muscle spasms. 30 tablet 0   trospium  (SANCTURA ) 20 MG tablet Take 1 tab 1 hour prior to bedtime 30 tablet 11   TRUEPLUS INSULIN  SYRINGE 31G X 5/16 0.5 ML MISC USE ONE SYRINGE DAILY TO INJECT LANTUS  UNDER THE SKIN. 100 each 4   No current facility-administered medications on file prior to visit.    Allergies  Allergen Reactions   Atorvastatin  Other (See Comments)    myalgias   Pravastatin  Other (See Comments)  Muscle pain    Past Medical History:  Diagnosis Date   Allergy    Asthma    Diabetes mellitus    Hyperlipidemia    Hypertension    Neuromuscular disorder (HCC)    polymyositis   Osteoarthritis    Vitamin B12 deficiency     Past Surgical History:  Procedure Laterality Date   COLONOSCOPY WITH PROPOFOL  N/A 03/07/2016   Procedure: COLONOSCOPY WITH PROPOFOL ;  Surgeon: Gladis RAYMOND Mariner, MD;  Location: [facility] ENDOSCOPY;  Service: Endoscopy;  Laterality: N/A;    Family History  Problem Relation Age of Onset   Heart disease Mother    Cancer Father    Breast cancer Neg Hx     Social History   Socioeconomic  History   Marital status: Widowed    Spouse name: Not on file   Number of children: 2   Years of education: Not on file   Highest education level: Not on file  Occupational History   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 [employer]    Comment:    Tobacco Use   Smoking status: Former    Passive exposure: Never   Smokeless tobacco: Never   Tobacco comments:    age 68 when stopped smoking  Vaping Use   Vaping status: Never Used  Substance and Sexual Activity   Alcohol use: No   Drug use: No   Sexual activity: Not on file  Other Topics Concern   Not on file  Social History Narrative   has living will   Son and daughter should make health care decisions for her   Would accept resuscitation   Not sure about tube feeds--but not excited about this   Social Drivers of Corporate Investment Banker Strain: Not on file  Food Insecurity: Not on file  Transportation Needs: Not on file  Physical Activity: Not on file  Stress: Not on file  Social Connections: Not on file  Intimate Partner Violence: Not on file   Review of Systems Appetite is good Weight stable Sleep is not great--discussed trying melatonin (did like temazepam  in the past) Wears seat belt Teeth are okay---upper dentures. Overdue for dentist No suspicious skin lesions No heartburn or dysphagia Sporadic pains--no persistent back or joint pains    Objective:   Physical Exam Constitutional:      Appearance: Normal appearance.  HENT:     Mouth/Throat:     Pharynx: No oropharyngeal exudate or posterior oropharyngeal erythema.  Eyes:     Conjunctiva/sclera: Conjunctivae normal.     Pupils: Pupils are equal, round, and reactive to light.  Cardiovascular:     Rate and Rhythm: Normal rate and regular rhythm.     Pulses: Normal pulses.     Heart sounds: No murmur heard.    No gallop.  Pulmonary:     Effort: Pulmonary effort is normal.     Breath sounds: Normal breath sounds. No wheezing or rales.   Abdominal:     Palpations: Abdomen is soft.     Tenderness: There is no abdominal tenderness.  Musculoskeletal:     Cervical back: Neck supple.     Right lower leg: No edema.     Left lower leg: No edema.  Lymphadenopathy:     Cervical: No cervical adenopathy.  Skin:    Findings: No rash.     Comments: No foot lesions  Neurological:     General: No focal deficit present.     Mental Status: She is alert  and oriented to person, place, and time.     Comments: Mini-cog normal Normal sensation in feet  Psychiatric:        Mood and Affect: Mood normal.        Behavior: Behavior normal.            Assessment & Plan:

## 2024-01-02 NOTE — Assessment & Plan Note (Signed)
 Controlled with methotrexate, gamma globulin and low dose prednsione

## 2024-01-02 NOTE — Assessment & Plan Note (Signed)
 I have personally reviewed the Medicare Annual Wellness questionnaire and have noted 1. The patient's medical and social history 2. Their use of alcohol, tobacco or illicit drugs 3. Their current medications and supplements 4. The patient's functional ability including ADL's, fall risks, home safety risks and hearing or visual             impairment. 5. Diet and physical activities 6. Evidence for depression or mood disorders  The patients weight, height, BMI and visual acuity have been recorded in the chart I have made referrals, counseling and provided education to the patient based review of the above and I have provided the pt with a written personalized care plan for preventive services.  I have provided you with a copy of your personalized plan for preventive services. Please take the time to review along with your updated medication list.  Consider last colonoscopy 2027 Due for mammogram now--then can consider whether to continue Had flu vaccine Recommended updated COVID vaccine Discussed increasing exercise

## 2024-01-03 ENCOUNTER — Telehealth: Payer: Self-pay

## 2024-01-03 NOTE — Telephone Encounter (Addendum)
 Tried calling patient. The number listed just rings. Will try calling patient again.  ----- Message from Bridget Romero sent at 01/03/2024  1:29 PM EST ----- Please send copy Your labs are fine The diabetes control has slipped some--but is still pretty good Cholesterol levels are fine Your anemia (low hemoglobin and HCT) is stable Everything else is normal

## 2024-01-14 ENCOUNTER — Ambulatory Visit
Admission: RE | Admit: 2024-01-14 | Discharge: 2024-01-14 | Disposition: A | Discharge status: Home / Self Care | Payer: Medicare PPO | Source: Ambulatory Visit | Attending: Rheumatology | Admitting: Rheumatology

## 2024-01-14 DIAGNOSIS — M3322 Polymyositis with myopathy: Secondary | ICD-10-CM | POA: Diagnosis not present

## 2024-01-14 MED ORDER — DIPHENHYDRAMINE HCL 25 MG PO CAPS
ORAL_CAPSULE | ORAL | Status: AC
  Filled 2024-01-14: qty 1

## 2024-01-14 MED ORDER — DIPHENHYDRAMINE HCL 25 MG PO CAPS
25.0000 mg | ORAL_CAPSULE | Freq: Once | ORAL | Status: AC
  Administered 2024-01-14: 25 mg via ORAL

## 2024-01-14 MED ORDER — IMMUNE GLOBULIN (HUMAN) 10 GM/100ML IV SOLN
400.0000 mg/kg | Freq: Once | INTRAVENOUS | Status: AC
  Administered 2024-01-14: 30 g via INTRAVENOUS
  Filled 2024-01-14: qty 300

## 2024-01-14 MED ORDER — METHYLPREDNISOLONE SODIUM SUCC 40 MG IJ SOLR
INTRAMUSCULAR | Status: AC
  Filled 2024-01-14: qty 1

## 2024-01-14 MED ORDER — IMMUNE GLOBULIN HUMAN NICU IV SYRINGE 100 MG/ML: 400.0000 mg/kg | Freq: Once | INTRAMUSCULAR | Status: DC

## 2024-01-14 MED ORDER — ACETAMINOPHEN 325 MG PO TABS
650.0000 mg | ORAL_TABLET | Freq: Once | ORAL | Status: AC
  Administered 2024-01-14: 650 mg via ORAL

## 2024-01-14 MED ORDER — METHYLPREDNISOLONE SODIUM SUCC 40 MG IJ SOLR
10.0000 mg | Freq: Once | INTRAMUSCULAR | Status: AC
  Administered 2024-01-14: 10 mg via INTRAVENOUS

## 2024-01-14 MED ORDER — ACETAMINOPHEN 325 MG PO TABS
ORAL_TABLET | ORAL | Status: AC
  Filled 2024-01-14: qty 2

## 2024-01-15 ENCOUNTER — Ambulatory Visit
Admission: RE | Admit: 2024-01-15 | Discharge: 2024-01-15 | Discharge status: Home / Self Care | Payer: Medicare PPO | Source: Ambulatory Visit | Attending: Rheumatology | Admitting: Rheumatology

## 2024-01-15 DIAGNOSIS — Z7969 Long term (current) use of other immunomodulators and immunosuppressants: Secondary | ICD-10-CM | POA: Insufficient documentation

## 2024-01-15 DIAGNOSIS — M3322 Polymyositis with myopathy: Secondary | ICD-10-CM | POA: Insufficient documentation

## 2024-01-15 MED ORDER — ACETAMINOPHEN 325 MG PO TABS
ORAL_TABLET | ORAL | Status: AC
  Filled 2024-01-15: qty 2

## 2024-01-15 MED ORDER — METHYLPREDNISOLONE SODIUM SUCC 40 MG IJ SOLR
10.0000 mg | Freq: Once | INTRAMUSCULAR | Status: AC
  Administered 2024-01-15: 10 mg via INTRAVENOUS

## 2024-01-15 MED ORDER — ACETAMINOPHEN 325 MG PO TABS
650.0000 mg | ORAL_TABLET | Freq: Once | ORAL | Status: AC
  Administered 2024-01-15: 650 mg via ORAL

## 2024-01-15 MED ORDER — IMMUNE GLOBULIN (HUMAN) 10 GM/100ML IV SOLN
400.0000 mg/kg | Freq: Once | INTRAVENOUS | Status: AC
  Administered 2024-01-15: 30 g via INTRAVENOUS
  Filled 2024-01-15: qty 300

## 2024-01-15 MED ORDER — DIPHENHYDRAMINE HCL 25 MG PO CAPS
25.0000 mg | ORAL_CAPSULE | Freq: Once | ORAL | Status: AC
  Administered 2024-01-15: 25 mg via ORAL

## 2024-01-15 MED ORDER — DIPHENHYDRAMINE HCL 25 MG PO CAPS
ORAL_CAPSULE | ORAL | Status: AC
  Filled 2024-01-15: qty 1

## 2024-01-15 MED ORDER — METHYLPREDNISOLONE SODIUM SUCC 40 MG IJ SOLR
INTRAMUSCULAR | Status: AC
  Filled 2024-01-15: qty 1

## 2024-01-16 ENCOUNTER — Inpatient Hospital Stay: Admission: RE | Admit: 2024-01-16 | Payer: Medicare PPO | Source: Ambulatory Visit

## 2024-01-17 ENCOUNTER — Ambulatory Visit
Admission: RE | Admit: 2024-01-17 | Discharge: 2024-01-17 | Disposition: A | Discharge status: Home / Self Care | Payer: Medicare PPO | Source: Ambulatory Visit | Attending: Internal Medicine | Admitting: Internal Medicine

## 2024-01-17 DIAGNOSIS — M3320 Polymyositis, organ involvement unspecified: Secondary | ICD-10-CM | POA: Diagnosis present

## 2024-01-17 MED ORDER — IMMUNE GLOBULIN (HUMAN) 10 GM/100ML IV SOLN
400.0000 mg/kg | Freq: Once | INTRAVENOUS | Status: AC
  Administered 2024-01-17: 30 g via INTRAVENOUS
  Filled 2024-01-17: qty 300

## 2024-01-17 MED ORDER — METHYLPREDNISOLONE SODIUM SUCC 40 MG IJ SOLR
10.0000 mg | Freq: Once | INTRAMUSCULAR | Status: AC
  Administered 2024-01-17: 10 mg via INTRAVENOUS

## 2024-01-17 MED ORDER — METHYLPREDNISOLONE SODIUM SUCC 40 MG IJ SOLR
INTRAMUSCULAR | Status: AC
Start: 2024-01-17 — End: ?
  Filled 2024-01-17: qty 1

## 2024-01-17 MED ORDER — DIPHENHYDRAMINE HCL 25 MG PO CAPS
25.0000 mg | ORAL_CAPSULE | Freq: Once | ORAL | Status: AC
  Administered 2024-01-17: 25 mg via ORAL

## 2024-01-17 MED ORDER — ACETAMINOPHEN 325 MG PO TABS
ORAL_TABLET | ORAL | Status: AC
  Filled 2024-01-17: qty 2

## 2024-01-17 MED ORDER — ACETAMINOPHEN 325 MG PO TABS
650.0000 mg | ORAL_TABLET | Freq: Once | ORAL | Status: AC
  Administered 2024-01-17: 650 mg via ORAL

## 2024-01-17 MED ORDER — DIPHENHYDRAMINE HCL 25 MG PO CAPS
ORAL_CAPSULE | ORAL | Status: AC
  Filled 2024-01-17: qty 1

## 2024-01-18 ENCOUNTER — Ambulatory Visit
Admission: RE | Admit: 2024-01-18 | Discharge: 2024-01-18 | Disposition: A | Discharge status: Home / Self Care | Payer: Medicare PPO | Source: Ambulatory Visit | Attending: Rheumatology | Admitting: Rheumatology

## 2024-01-18 ENCOUNTER — Inpatient Hospital Stay: Admission: RE | Admit: 2024-01-18 | Payer: Medicare PPO | Source: Ambulatory Visit

## 2024-01-18 DIAGNOSIS — M3322 Polymyositis with myopathy: Secondary | ICD-10-CM | POA: Insufficient documentation

## 2024-01-18 DIAGNOSIS — Z7969 Long term (current) use of other immunomodulators and immunosuppressants: Secondary | ICD-10-CM | POA: Diagnosis not present

## 2024-01-18 MED ORDER — METHYLPREDNISOLONE SODIUM SUCC 40 MG IJ SOLR
INTRAMUSCULAR | Status: AC
  Filled 2024-01-18: qty 1

## 2024-01-18 MED ORDER — ACETAMINOPHEN 325 MG PO TABS
650.0000 mg | ORAL_TABLET | Freq: Once | ORAL | Status: AC
  Administered 2024-01-18: 650 mg via ORAL

## 2024-01-18 MED ORDER — ACETAMINOPHEN 325 MG PO TABS
ORAL_TABLET | ORAL | Status: AC
  Filled 2024-01-18: qty 2

## 2024-01-18 MED ORDER — METHYLPREDNISOLONE SODIUM SUCC 40 MG IJ SOLR
10.0000 mg | Freq: Once | INTRAMUSCULAR | Status: AC
  Administered 2024-01-18: 10 mg via INTRAVENOUS

## 2024-01-18 MED ORDER — DIPHENHYDRAMINE HCL 25 MG PO CAPS
25.0000 mg | ORAL_CAPSULE | Freq: Once | ORAL | Status: AC
  Administered 2024-01-18: 25 mg via ORAL

## 2024-01-18 MED ORDER — IMMUNE GLOBULIN (HUMAN) 10 GM/100ML IV SOLN
400.0000 mg/kg | Freq: Once | INTRAVENOUS | Status: AC
  Administered 2024-01-18: 30 g via INTRAVENOUS
  Filled 2024-01-18: qty 300

## 2024-01-18 MED ORDER — DIPHENHYDRAMINE HCL 25 MG PO CAPS
ORAL_CAPSULE | ORAL | Status: AC
  Filled 2024-01-18: qty 1

## 2024-02-22 ENCOUNTER — Ambulatory Visit: Payer: Medicare PPO | Admitting: Physician Assistant

## 2024-02-22 ENCOUNTER — Encounter: Payer: Self-pay | Admitting: Physician Assistant

## 2024-02-22 VITALS — Ht 66.5 in | Wt 184.0 lb

## 2024-02-22 DIAGNOSIS — R351 Nocturia: Secondary | ICD-10-CM

## 2024-02-22 DIAGNOSIS — R3989 Other symptoms and signs involving the genitourinary system: Secondary | ICD-10-CM | POA: Diagnosis not present

## 2024-02-22 LAB — MICROSCOPIC EXAMINATION: Bacteria, UA: NONE SEEN

## 2024-02-22 LAB — URINALYSIS, COMPLETE
Bilirubin, UA: NEGATIVE
Glucose, UA: NEGATIVE
Ketones, UA: NEGATIVE
Leukocytes,UA: NEGATIVE
Nitrite, UA: NEGATIVE
Protein,UA: NEGATIVE
RBC, UA: NEGATIVE
Specific Gravity, UA: 1.02 (ref 1.005–1.030)
Urobilinogen, Ur: 0.2 mg/dL (ref 0.2–1.0)
pH, UA: 5.5 (ref 5.0–7.5)

## 2024-02-22 LAB — BLADDER SCAN AMB NON-IMAGING: Scan Result: 87

## 2024-02-22 NOTE — Progress Notes (Signed)
 02/22/2024 9:00 AM   Bridget Romero December 03, 1946 [id number]  CC: Chief Complaint  Patient presents with   Nocturia   HPI: Bridget Romero is a 78 y.o. female with PMH uterine fibroids, DM, and nocturia on trospium 20 mg nightly who presents today for evaluation of bladder pressure.   Today she reports about 3 months of worsening bladder pressure at night when lying supine and nocturia x 1-2, previously x 0-1.  The pressure is not associated with urgency.  No bowel changes, dysuria, or gross hematuria.  Bladder scan on arrival 87 mL.  She was unable to spontaneously void.  She was catheterized for specimen and cath UA and urine microscopy were pan-negative.  PMH: Past Medical History:  Diagnosis Date   Allergy    Asthma    Diabetes mellitus    Hyperlipidemia    Hypertension    Neuromuscular disorder (HCC)    polymyositis   Osteoarthritis    Vitamin B12 deficiency     Surgical History: Past Surgical History:  Procedure Laterality Date   COLONOSCOPY WITH PROPOFOL N/A 03/07/2016   Procedure: COLONOSCOPY WITH PROPOFOL;  Surgeon: Christena Deem, MD;  Location: [facility] ENDOSCOPY;  Service: Endoscopy;  Laterality: N/A;    Home Medications:  Allergies as of 02/22/2024       Reactions   Atorvastatin Other (See Comments)   myalgias   Pravastatin Other (See Comments)   Muscle pain        Medication List        Accurate as of February 22, 2024  9:00 AM. If you have any questions, ask your nurse or doctor.          Accu-Chek Aviva Soln Use as directed   Accu-Chek FastClix Lancets Misc USE TO TEST BLOOD SUGAR TWICE DAILY DX E11.40   Accu-Chek SmartView test strip Generic drug: glucose blood USE AS DIRECTED TO CHECK BLOOD SUGAR TWICE DAILY   acetaminophen 500 MG tablet Commonly known as: TYLENOL Take 500 mg by mouth every 6 (six) hours as needed.   B-D SINGLE USE SWABS REGULAR Pads Use to test blood sugar once daily dx: 250.00   BD Syringe Slip  Tip 25G X 5/8" 1 ML Misc Generic drug: TUBERCULIN SYR 1CC/25GX5/8"   ferrous sulfate 325 (65 FE) MG tablet Take 325 mg by mouth daily with breakfast.   folic acid 1 MG tablet Commonly known as: FOLVITE Take 1 mg by mouth daily.   gabapentin 300 MG capsule Commonly known as: NEURONTIN Take 1 capsule by mouth 3 (three) times daily.   Insulin Syringe-Needle U-100 31G X 5/16" 1 ML Misc by Does not apply route.   TRUEplus Insulin Syringe 31G X 5/16" 0.5 ML Misc Generic drug: Insulin Syringe-Needle U-100 USE ONE SYRINGE DAILY TO INJECT LANTUS UNDER THE SKIN.   Lantus 100 UNIT/ML injection Generic drug: insulin glargine INJECT TEN TO 20 UNITS INTO THE SKIN DAILY AS DIRECTED (VIAL IS GOOD FOR 28 DAYS AFTER FIRST USE)   lisinopril 10 MG tablet Commonly known as: ZESTRIL TAKE ONE TABLET BY MOUTH ONCE A DAY   magnesium oxide 400 (240 Mg) MG tablet Commonly known as: MAG-OX Take 400 mg by mouth daily.   metFORMIN 1000 MG tablet Commonly known as: GLUCOPHAGE TAKE ONE TABLET BY MOUTH TWICE A DAY   methotrexate 250 MG/10ML injection Inject into the skin.   multivitamin tablet Take 1 tablet by mouth daily.   nystatin powder Commonly known as: MYCOSTATIN/NYSTOP Apply 1 Application topically 3 (three)  times daily.   predniSONE 2.5 MG tablet Commonly known as: DELTASONE Take 2.5 mg by mouth daily.   tiZANidine 2 MG tablet Commonly known as: ZANAFLEX Take 1-2 tablets (2-4 mg total) by mouth at bedtime as needed for muscle spasms.   trospium 20 MG tablet Commonly known as: SANCTURA Take 1 tab 1 hour prior to bedtime   Vitamin D 1000 units capsule Take 1,000 Units by mouth daily.        Allergies:  Allergies  Allergen Reactions   Atorvastatin Other (See Comments)    myalgias   Pravastatin Other (See Comments)    Muscle pain    Family History: Family History  Problem Relation Age of Onset   Heart disease Mother    Cancer Father    Breast cancer Neg Hx      Social History:   reports that she has quit smoking. She has never been exposed to tobacco smoke. She has never used smokeless tobacco. She reports that she does not drink alcohol and does not use drugs.  Physical Exam: Ht 5' 6.5" (1.689 m)   Wt 184 lb (83.5 kg)   BMI 29.25 kg/m   Constitutional:  Alert and oriented, no acute distress, nontoxic appearing HEENT: [place], AT Cardiovascular: No clubbing, cyanosis, or edema Respiratory: Normal respiratory effort, no increased work of breathing GU: No caruncle or cystocele, negative cough test Skin: No rashes, bruises or suspicious lesions Neurologic: Grossly intact, no focal deficits, moving all 4 extremities Psychiatric: Normal mood and affect  Laboratory Data: Results for orders placed or performed in visit on 02/22/24  Bladder Scan (Post Void Residual) in office   Collection Time: 02/22/24  8:56 AM  Result Value Ref Range   Scan Result 87   Microscopic Examination   Collection Time: 02/22/24  9:21 AM   Urine  Result Value Ref Range   WBC, UA 0-5 0 - 5 /hpf   RBC, Urine 0-2 0 - 2 /hpf   Epithelial Cells (non renal) 0-10 0 - 10 /hpf   Mucus, UA Present (A) Not Estab.   Bacteria, UA None seen None seen/Few  Urinalysis, Complete   Collection Time: 02/22/24  9:21 AM  Result Value Ref Range   Specific Gravity, UA 1.020 1.005 - 1.030   pH, UA 5.5 5.0 - 7.5   Color, UA Yellow Yellow   Appearance Ur Clear Clear   Leukocytes,UA Negative Negative   Protein,UA Negative Negative/Trace   Glucose, UA Negative Negative   Ketones, UA Negative Negative   RBC, UA Negative Negative   Bilirubin, UA Negative Negative   Urobilinogen, Ur 0.2 0.2 - 1.0 mg/dL   Nitrite, UA Negative Negative   Microscopic Examination See below:    Assessment & Plan:   1. Sensation of pressure in bladder area (Primary) UA is bland and she is emptying appropriately.  No significant findings on pelvic exam today no evidence of POP.  With her history of uterine  fibroids, will obtain pelvic ultrasound, though we discussed that interval growth in a postmenopausal patient is unlikely. - Urinalysis, Complete - Bladder Scan (Post Void Residual) in office - US PELVIC COMPLETE WITH TRANSVAGINAL; Future   Return for Will call with results.  Carman Ching, PA-C  [facility] [place] [address], [address] [place], [place] [address] [phone]

## 2024-02-29 ENCOUNTER — Other Ambulatory Visit: Payer: Self-pay | Admitting: Internal Medicine

## 2024-02-29 DIAGNOSIS — M5116 Intervertebral disc disorders with radiculopathy, lumbar region: Secondary | ICD-10-CM | POA: Diagnosis not present

## 2024-02-29 DIAGNOSIS — Z7960 Long term (current) use of unspecified immunomodulators and immunosuppressants: Secondary | ICD-10-CM | POA: Diagnosis not present

## 2024-02-29 DIAGNOSIS — M3322 Polymyositis with myopathy: Secondary | ICD-10-CM | POA: Diagnosis not present

## 2024-03-03 ENCOUNTER — Ambulatory Visit
Admission: RE | Admit: 2024-03-03 | Discharge: 2024-03-03 | Disposition: A | Discharge status: Home / Self Care | Source: Ambulatory Visit | Attending: Physician Assistant | Admitting: Physician Assistant

## 2024-03-03 DIAGNOSIS — R3989 Other symptoms and signs involving the genitourinary system: Secondary | ICD-10-CM | POA: Insufficient documentation

## 2024-03-03 DIAGNOSIS — D259 Leiomyoma of uterus, unspecified: Secondary | ICD-10-CM | POA: Diagnosis not present

## 2024-03-10 ENCOUNTER — Ambulatory Visit
Admission: RE | Admit: 2024-03-10 | Discharge: 2024-03-10 | Disposition: A | Discharge status: Home / Self Care | Payer: Medicare PPO | Source: Ambulatory Visit | Attending: Rheumatology | Admitting: Rheumatology

## 2024-03-10 DIAGNOSIS — M3320 Polymyositis, organ involvement unspecified: Secondary | ICD-10-CM | POA: Insufficient documentation

## 2024-03-10 MED ORDER — DIPHENHYDRAMINE HCL 25 MG PO CAPS
ORAL_CAPSULE | ORAL | Status: AC
  Filled 2024-03-10: qty 1

## 2024-03-10 MED ORDER — IMMUNE GLOBULIN (HUMAN) 10 GM/100ML IV SOLN
400.0000 mg/kg | Freq: Once | INTRAVENOUS | Status: AC
  Administered 2024-03-10: 30 g via INTRAVENOUS
  Filled 2024-03-10: qty 300

## 2024-03-10 MED ORDER — DIPHENHYDRAMINE HCL 25 MG PO CAPS
25.0000 mg | ORAL_CAPSULE | Freq: Once | ORAL | Status: AC
  Administered 2024-03-10: 25 mg via ORAL

## 2024-03-10 MED ORDER — METHYLPREDNISOLONE SODIUM SUCC 40 MG IJ SOLR
10.0000 mg | Freq: Once | INTRAMUSCULAR | Status: AC
  Administered 2024-03-10: 10 mg via INTRAVENOUS

## 2024-03-10 MED ORDER — ACETAMINOPHEN 325 MG PO TABS
ORAL_TABLET | ORAL | Status: AC
  Filled 2024-03-10: qty 2

## 2024-03-10 MED ORDER — METHYLPREDNISOLONE SODIUM SUCC 40 MG IJ SOLR
INTRAMUSCULAR | Status: AC
  Filled 2024-03-10: qty 1

## 2024-03-10 MED ORDER — ACETAMINOPHEN 325 MG PO TABS
650.0000 mg | ORAL_TABLET | Freq: Once | ORAL | Status: AC
  Administered 2024-03-10: 650 mg via ORAL

## 2024-03-11 ENCOUNTER — Ambulatory Visit
Admission: RE | Admit: 2024-03-11 | Discharge: 2024-03-11 | Disposition: A | Discharge status: Home / Self Care | Source: Ambulatory Visit | Attending: Rheumatology | Admitting: Rheumatology

## 2024-03-11 DIAGNOSIS — M3320 Polymyositis, organ involvement unspecified: Secondary | ICD-10-CM | POA: Insufficient documentation

## 2024-03-11 MED ORDER — ACETAMINOPHEN 325 MG PO TABS
650.0000 mg | ORAL_TABLET | Freq: Once | ORAL | Status: AC
  Administered 2024-03-11: 650 mg via ORAL

## 2024-03-11 MED ORDER — METHYLPREDNISOLONE SODIUM SUCC 40 MG IJ SOLR
INTRAMUSCULAR | Status: AC
  Filled 2024-03-11: qty 1

## 2024-03-11 MED ORDER — DIPHENHYDRAMINE HCL 25 MG PO CAPS
25.0000 mg | ORAL_CAPSULE | Freq: Once | ORAL | Status: AC
  Administered 2024-03-11: 25 mg via ORAL

## 2024-03-11 MED ORDER — DIPHENHYDRAMINE HCL 25 MG PO CAPS
ORAL_CAPSULE | ORAL | Status: AC
  Filled 2024-03-11: qty 1

## 2024-03-11 MED ORDER — METHYLPREDNISOLONE SODIUM SUCC 40 MG IJ SOLR
10.0000 mg | Freq: Once | INTRAMUSCULAR | Status: AC
  Administered 2024-03-11: 10 mg via INTRAVENOUS

## 2024-03-11 MED ORDER — IMMUNE GLOBULIN (HUMAN) 10 GM/100ML IV SOLN
400.0000 mg/kg | Freq: Once | INTRAVENOUS | Status: AC
  Administered 2024-03-11: 30 g via INTRAVENOUS
  Filled 2024-03-11: qty 300

## 2024-03-11 MED ORDER — ACETAMINOPHEN 325 MG PO TABS
ORAL_TABLET | ORAL | Status: AC
  Filled 2024-03-11: qty 2

## 2024-03-12 ENCOUNTER — Ambulatory Visit
Admission: RE | Admit: 2024-03-12 | Discharge: 2024-03-12 | Disposition: A | Discharge status: Home / Self Care | Source: Ambulatory Visit | Attending: Rheumatology | Admitting: Rheumatology

## 2024-03-12 DIAGNOSIS — M3320 Polymyositis, organ involvement unspecified: Secondary | ICD-10-CM | POA: Diagnosis not present

## 2024-03-12 MED ORDER — ACETAMINOPHEN 325 MG PO TABS
650.0000 mg | ORAL_TABLET | Freq: Once | ORAL | Status: AC
  Administered 2024-03-12: 650 mg via ORAL

## 2024-03-12 MED ORDER — METHYLPREDNISOLONE SODIUM SUCC 40 MG IJ SOLR
INTRAMUSCULAR | Status: AC
  Filled 2024-03-12: qty 1

## 2024-03-12 MED ORDER — DIPHENHYDRAMINE HCL 25 MG PO CAPS
25.0000 mg | ORAL_CAPSULE | Freq: Once | ORAL | Status: AC
  Administered 2024-03-12: 25 mg via ORAL

## 2024-03-12 MED ORDER — DIPHENHYDRAMINE HCL 25 MG PO CAPS
ORAL_CAPSULE | ORAL | Status: AC
  Filled 2024-03-12: qty 1

## 2024-03-12 MED ORDER — METHYLPREDNISOLONE SODIUM SUCC 40 MG IJ SOLR
10.0000 mg | Freq: Once | INTRAMUSCULAR | Status: AC
  Administered 2024-03-12: 10 mg via INTRAVENOUS

## 2024-03-12 MED ORDER — ACETAMINOPHEN 325 MG PO TABS
ORAL_TABLET | ORAL | Status: AC
  Filled 2024-03-12: qty 2

## 2024-03-12 MED ORDER — IMMUNE GLOBULIN (HUMAN) 10 GM/100ML IV SOLN
400.0000 mg/kg | Freq: Once | INTRAVENOUS | Status: AC
Start: 2024-03-12 — End: 2024-03-12
  Administered 2024-03-12: 30 g via INTRAVENOUS
  Filled 2024-03-12: qty 300

## 2024-03-13 ENCOUNTER — Ambulatory Visit
Admission: RE | Admit: 2024-03-13 | Discharge: 2024-03-13 | Disposition: A | Discharge status: Home / Self Care | Source: Ambulatory Visit | Attending: Rheumatology | Admitting: Rheumatology

## 2024-03-13 DIAGNOSIS — M3322 Polymyositis with myopathy: Secondary | ICD-10-CM | POA: Diagnosis present

## 2024-03-13 MED ORDER — IMMUNE GLOBULIN (HUMAN) 10 GM/100ML IV SOLN
400.0000 mg/kg | Freq: Once | INTRAVENOUS | Status: AC
  Administered 2024-03-13: 30 g via INTRAVENOUS
  Filled 2024-03-13: qty 300

## 2024-03-13 MED ORDER — ACETAMINOPHEN 325 MG PO TABS
ORAL_TABLET | ORAL | Status: AC
  Filled 2024-03-13: qty 2

## 2024-03-13 MED ORDER — METHYLPREDNISOLONE SODIUM SUCC 40 MG IJ SOLR
10.0000 mg | Freq: Once | INTRAMUSCULAR | Status: AC
  Administered 2024-03-13: 10 mg via INTRAVENOUS

## 2024-03-13 MED ORDER — ACETAMINOPHEN 325 MG PO TABS
650.0000 mg | ORAL_TABLET | Freq: Once | ORAL | Status: AC
  Administered 2024-03-13: 650 mg via ORAL

## 2024-03-13 MED ORDER — DIPHENHYDRAMINE HCL 25 MG PO CAPS
ORAL_CAPSULE | ORAL | Status: AC
Start: 2024-03-13 — End: ?
  Filled 2024-03-13: qty 1

## 2024-03-13 MED ORDER — DIPHENHYDRAMINE HCL 25 MG PO CAPS
25.0000 mg | ORAL_CAPSULE | Freq: Once | ORAL | Status: AC
  Administered 2024-03-13: 25 mg via ORAL

## 2024-03-13 MED ORDER — METHYLPREDNISOLONE SODIUM SUCC 40 MG IJ SOLR
INTRAMUSCULAR | Status: AC
  Filled 2024-03-13: qty 1

## 2024-03-14 MED FILL — Immune Globulin (Human) IV Soln 20 GM/200ML: INTRAVENOUS | Qty: 200 | Status: AC

## 2024-03-14 MED FILL — Immune Globulin (Human) IV Soln 10 GM/100ML: INTRAVENOUS | Qty: 100 | Status: AC

## 2024-03-26 ENCOUNTER — Telehealth: Payer: Self-pay

## 2024-03-26 NOTE — Telephone Encounter (Signed)
 Copied from CRM [id number]. Topic: General - Call Back - No Documentation >> Mar 26, 2024  2:16 PM Fonda Kinder J wrote: Reason for CRM: Pt is requesting a call back from [place] regarding a notic e she received in the mail back in March about the labs error. I did advise the pt of the disclosures and she states she still wants to speak with Jasmine December

## 2024-03-27 ENCOUNTER — Encounter: Payer: Self-pay | Admitting: Internal Medicine

## 2024-03-27 ENCOUNTER — Ambulatory Visit: Admitting: Internal Medicine

## 2024-03-27 VITALS — BP 122/80 | HR 84 | Temp 98.4°F | Ht 66.5 in | Wt 181.0 lb

## 2024-03-27 DIAGNOSIS — R5383 Other fatigue: Secondary | ICD-10-CM | POA: Insufficient documentation

## 2024-03-27 LAB — COMPREHENSIVE METABOLIC PANEL WITH GFR
ALT: 43 U/L — ABNORMAL HIGH (ref 0–35)
AST: 37 U/L (ref 0–37)
Albumin: 3.7 g/dL (ref 3.5–5.2)
Alkaline Phosphatase: 49 U/L (ref 39–117)
BUN: 24 mg/dL — ABNORMAL HIGH (ref 6–23)
CO2: 27 meq/L (ref 19–32)
Calcium: 9.9 mg/dL (ref 8.4–10.5)
Chloride: 104 meq/L (ref 96–112)
Creatinine, Ser: 1.04 mg/dL (ref 0.40–1.20)
GFR: 52.38 mL/min — ABNORMAL LOW (ref >60.00)
Glucose, Bld: 213 mg/dL — ABNORMAL HIGH (ref 70–99)
Potassium: 4.6 meq/L (ref 3.5–5.1)
Sodium: 138 meq/L (ref 135–145)
Total Bilirubin: 0.6 mg/dL (ref 0.2–1.2)
Total Protein: 7.1 g/dL (ref 6.0–8.3)

## 2024-03-27 LAB — TSH: TSH: 1.53 u[IU]/mL (ref 0.35–5.50)

## 2024-03-27 LAB — CBC
HCT: 30.3 % — ABNORMAL LOW (ref 36.0–46.0)
Hemoglobin: 9.8 g/dL — ABNORMAL LOW (ref 12.0–15.0)
MCHC: 32.2 g/dL (ref 30.0–36.0)
MCV: 91.3 fl (ref 78.0–100.0)
Platelets: 297 10*3/uL (ref 150.0–400.0)
RBC: 3.32 Mil/uL — ABNORMAL LOW (ref 3.87–5.11)
RDW: 14 % (ref 11.5–15.5)
WBC: 6.1 10*3/uL (ref 4.0–10.5)

## 2024-03-27 NOTE — Assessment & Plan Note (Signed)
 Sleeps and eats okay No overt depression Discussed more stimulation--going outside, to park, etc Will check labs

## 2024-03-27 NOTE — Telephone Encounter (Signed)
 Spoke to pt. Explained the lab error and that she was good. She is having increased fatigue and wondered about taking more Iron. I suggested an OV to discuss her fatigue. There may be other labs he can check. Made her an appt for today at 1045.

## 2024-03-27 NOTE — Progress Notes (Signed)
 Subjective:    Patient ID: Bridget Romero, female    DOB: [birth year], 78 y.o.   MRN: [id number]  HPI Here due to fatigue  Feels tired and sleepy--"have to make myself go" Goes back a couple of weeks Sleeps okay---but awakens not rested (11PM-7AM usually) Will nap occasionally -10-15 minutes (often feels better)  Has daily to sister for many months---at [facility] Not eating well and not doing well Not really depressed though No exercise Medications are all the same  Current Outpatient Medications on File Prior to Visit  Medication Sig Dispense Refill   Accu-Chek FastClix Lancets MISC USE TO TEST BLOOD SUGAR TWICE DAILY DX E11.40 102 each 11   ACCU-CHEK SMARTVIEW test strip USE AS DIRECTED TO CHECK BLOOD SUGAR TWICE DAILY 100 each 6   acetaminophen (TYLENOL) 500 MG tablet Take 500 mg by mouth every 6 (six) hours as needed.     Alcohol Swabs (B-D SINGLE USE SWABS REGULAR) PADS Use to test blood sugar once daily dx: 250.00 100 each 3   BD SYRINGE SLIP TIP 25G X 5/8" 1 ML MISC      Blood Glucose Calibration (ACCU-CHEK AVIVA) SOLN Use as directed 1 each 3   Cholecalciferol (VITAMIN D) 1000 UNITS capsule Take 1,000 Units by mouth daily.     ferrous sulfate 325 (65 FE) MG tablet Take 325 mg by mouth daily with breakfast.     folic acid (FOLVITE) 1 MG tablet Take 1 mg by mouth daily.     gabapentin (NEURONTIN) 300 MG capsule Take 1 capsule by mouth 3 (three) times daily.     Insulin Syringe-Needle U-100 31G X 5/16" 1 ML MISC by Does not apply route.     LANTUS 100 UNIT/ML injection INJECT TEN TO 20 UNITS INTO THE SKIN DAILY AS DIRECTED (VIAL IS GOOD FOR 28 DAYS AFTER FIRST USE) 10 mL 3   lisinopril (ZESTRIL) 10 MG tablet TAKE ONE TABLET BY MOUTH ONCE A DAY 90 tablet 3   magnesium oxide (MAG-OX) 400 (240 Mg) MG tablet Take 400 mg by mouth daily.     metFORMIN (GLUCOPHAGE) 1000 MG tablet TAKE ONE TABLET BY MOUTH TWICE A DAY 180 tablet 3   methotrexate 250 MG/10ML  injection Inject into the skin.      Multiple Vitamin (MULTIVITAMIN) tablet Take 1 tablet by mouth daily.     nystatin (MYCOSTATIN/NYSTOP) powder Apply 1 Application topically 3 (three) times daily. 60 g 2   predniSONE (DELTASONE) 2.5 MG tablet Take 2.5 mg by mouth daily.     tiZANidine (ZANAFLEX) 2 MG tablet Take 1-2 tablets (2-4 mg total) by mouth at bedtime as needed for muscle spasms. 30 tablet 0   trospium (SANCTURA) 20 MG tablet Take 1 tab 1 hour prior to bedtime 30 tablet 11   TRUEPLUS INSULIN SYRINGE 31G X 5/16" 0.5 ML MISC USE ONE SYRINGE DAILY TO INJECT LANTUS UNDER THE SKIN. 100 each 4   No current facility-administered medications on file prior to visit.    Allergies  Allergen Reactions   Atorvastatin Other (See Comments)    myalgias   Pravastatin Other (See Comments)    Muscle pain    Past Medical History:  Diagnosis Date   Allergy    Asthma    Diabetes mellitus    Hyperlipidemia    Hypertension    Neuromuscular disorder (HCC)    polymyositis   Osteoarthritis    Vitamin B12 deficiency     Past Surgical History:  Procedure Laterality Date   COLONOSCOPY WITH PROPOFOL N/A 03/07/2016   Procedure: COLONOSCOPY WITH PROPOFOL;  Surgeon: Christena Deem, MD;  Location: [facility] ENDOSCOPY;  Service: Endoscopy;  Laterality: N/A;    Family History  Problem Relation Age of Onset   Heart disease Mother    Cancer Father    Breast cancer Neg Hx     Social History   Socioeconomic History   Marital status: Widowed    Spouse name: Not on file   Number of children: 2   Years of education: Not on file   Highest education level: Not on file  Occupational History   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 [employer]    Comment:    Tobacco Use   Smoking status: Former    Passive exposure: Never   Smokeless tobacco: Never   Tobacco comments:    age 14 when stopped smoking  Vaping Use   Vaping status: Never Used  Substance and Sexual Activity   Alcohol use:  No   Drug use: No   Sexual activity: Not on file  Other Topics Concern   Not on file  Social History Narrative   has living will   Son and daughter should make health care decisions for her   Would accept resuscitation   Not sure about tube feeds--but not excited about this   Social Drivers of Corporate investment banker Strain: Not on file  Food Insecurity: Not on file  Transportation Needs: Not on file  Physical Activity: Not on file  Stress: Not on file  Social Connections: Not on file  Intimate Partner Violence: Not on file   Review of Systems Eating okay Weight is stable Mostly rests other than seeing sister Some ongoing tightness in lower abdomen---has been investigated with no diagnosis by urology    Objective:   Physical Exam Constitutional:      Appearance: Normal appearance.  Cardiovascular:     Rate and Rhythm: Normal rate and regular rhythm.     Heart sounds: No murmur heard.    No gallop.  Pulmonary:     Effort: Pulmonary effort is normal.     Breath sounds: Normal breath sounds. No wheezing or rales.  Abdominal:     Palpations: Abdomen is soft.     Tenderness: There is no abdominal tenderness.  Musculoskeletal:     Cervical back: Neck supple.  Lymphadenopathy:     Cervical: No cervical adenopathy.  Neurological:     Mental Status: She is alert.            Assessment & Plan:

## 2024-04-21 ENCOUNTER — Ambulatory Visit
Admission: RE | Admit: 2024-04-21 | Discharge: 2024-04-21 | Disposition: A | Discharge status: Home / Self Care | Source: Ambulatory Visit | Attending: Rheumatology | Admitting: Rheumatology

## 2024-04-21 DIAGNOSIS — M3320 Polymyositis, organ involvement unspecified: Secondary | ICD-10-CM | POA: Insufficient documentation

## 2024-04-21 MED ORDER — ACETAMINOPHEN 325 MG PO TABS
ORAL_TABLET | ORAL | Status: AC
  Filled 2024-04-21: qty 2

## 2024-04-21 MED ORDER — DIPHENHYDRAMINE HCL 25 MG PO CAPS
ORAL_CAPSULE | ORAL | Status: AC
  Filled 2024-04-21: qty 1

## 2024-04-21 MED ORDER — ACETAMINOPHEN 325 MG PO TABS
650.0000 mg | ORAL_TABLET | Freq: Once | ORAL | Status: AC
  Administered 2024-04-21: 650 mg via ORAL

## 2024-04-21 MED ORDER — DIPHENHYDRAMINE HCL 25 MG PO CAPS
25.0000 mg | ORAL_CAPSULE | Freq: Once | ORAL | Status: AC
  Administered 2024-04-21: 25 mg via ORAL

## 2024-04-21 MED ORDER — METHYLPREDNISOLONE SODIUM SUCC 40 MG IJ SOLR
10.0000 mg | Freq: Once | INTRAMUSCULAR | Status: AC
  Administered 2024-04-21: 10 mg via INTRAVENOUS

## 2024-04-21 MED ORDER — METHYLPREDNISOLONE SODIUM SUCC 40 MG IJ SOLR
INTRAMUSCULAR | Status: AC
  Filled 2024-04-21: qty 1

## 2024-04-21 MED ORDER — IMMUNE GLOBULIN (HUMAN) 10 GM/100ML IV SOLN
500.0000 mg/kg | Freq: Once | INTRAVENOUS | Status: AC
Start: 2024-04-21 — End: 2024-04-21
  Administered 2024-04-21: 35 g via INTRAVENOUS
  Filled 2024-04-21: qty 350

## 2024-04-21 MED ORDER — IMMUNE GLOBULIN (HUMAN) 10 GM/100ML IV SOLN
2.0000 g/kg | Freq: Once | INTRAVENOUS | Status: DC
  Filled 2024-04-21: qty 1650

## 2024-04-21 NOTE — Progress Notes (Signed)
 Nurse notified by pharmacy that current ordered dose of IVIG was a larger amount than patient has been receiving. Dr. Lydia Sams was notified and MD clarified order that patient is to receive IVIG 500 mg/kg x4 days for a total of 2g/kg. Order faxed to MD office to verify. Order signed and faxed back and placed in patient's folder. Patient received ordered dose of 500 mg/kg today.

## 2024-04-22 ENCOUNTER — Ambulatory Visit
Admission: RE | Admit: 2024-04-22 | Discharge: 2024-04-22 | Disposition: A | Discharge status: Home / Self Care | Source: Ambulatory Visit | Attending: Rheumatology | Admitting: Rheumatology

## 2024-04-22 DIAGNOSIS — M3322 Polymyositis with myopathy: Secondary | ICD-10-CM | POA: Diagnosis not present

## 2024-04-22 MED ORDER — DIPHENHYDRAMINE HCL 25 MG PO CAPS
25.0000 mg | ORAL_CAPSULE | Freq: Once | ORAL | Status: AC
  Administered 2024-04-22: 25 mg via ORAL

## 2024-04-22 MED ORDER — METHYLPREDNISOLONE SODIUM SUCC 40 MG IJ SOLR
10.0000 mg | Freq: Once | INTRAMUSCULAR | Status: AC
  Administered 2024-04-22: 10 mg via INTRAVENOUS

## 2024-04-22 MED ORDER — ACETAMINOPHEN 325 MG PO TABS
650.0000 mg | ORAL_TABLET | Freq: Once | ORAL | Status: AC
  Administered 2024-04-22: 650 mg via ORAL

## 2024-04-22 MED ORDER — DIPHENHYDRAMINE HCL 25 MG PO CAPS
ORAL_CAPSULE | ORAL | Status: AC
  Filled 2024-04-22: qty 1

## 2024-04-22 MED ORDER — ACETAMINOPHEN 325 MG PO TABS
ORAL_TABLET | ORAL | Status: AC
  Filled 2024-04-22: qty 2

## 2024-04-22 MED ORDER — IMMUNE GLOBULIN (HUMAN) 20 GM/200ML IV SOLN
500.0000 mg/kg | Freq: Once | INTRAVENOUS | Status: AC
Start: 2024-04-22 — End: 2024-04-22
  Administered 2024-04-22 (×2): 35 g via INTRAVENOUS
  Filled 2024-04-22: qty 200

## 2024-04-22 MED ORDER — METHYLPREDNISOLONE SODIUM SUCC 40 MG IJ SOLR
INTRAMUSCULAR | Status: AC
  Filled 2024-04-22: qty 1

## 2024-04-23 ENCOUNTER — Ambulatory Visit
Admission: RE | Admit: 2024-04-23 | Discharge: 2024-04-23 | Disposition: A | Discharge status: Home / Self Care | Source: Ambulatory Visit | Attending: Rheumatology | Admitting: Rheumatology

## 2024-04-23 DIAGNOSIS — M3320 Polymyositis, organ involvement unspecified: Secondary | ICD-10-CM | POA: Insufficient documentation

## 2024-04-23 MED ORDER — ACETAMINOPHEN 325 MG PO TABS
650.0000 mg | ORAL_TABLET | Freq: Once | ORAL | Status: AC
  Administered 2024-04-23: 650 mg via ORAL

## 2024-04-23 MED ORDER — METHYLPREDNISOLONE SODIUM SUCC 40 MG IJ SOLR
INTRAMUSCULAR | Status: AC
  Filled 2024-04-23: qty 1

## 2024-04-23 MED ORDER — DIPHENHYDRAMINE HCL 25 MG PO CAPS
25.0000 mg | ORAL_CAPSULE | Freq: Once | ORAL | Status: AC
  Administered 2024-04-23: 25 mg via ORAL

## 2024-04-23 MED ORDER — IMMUNE GLOBULIN (HUMAN) 10 GM/100ML IV SOLN
500.0000 mg/kg | Freq: Once | INTRAVENOUS | Status: AC
Start: 2024-04-23 — End: 2024-04-23
  Administered 2024-04-23 (×4): 35 g via INTRAVENOUS
  Filled 2024-04-23: qty 350

## 2024-04-23 MED ORDER — ACETAMINOPHEN 325 MG PO TABS
ORAL_TABLET | ORAL | Status: AC
  Filled 2024-04-23: qty 2

## 2024-04-23 MED ORDER — METHYLPREDNISOLONE SODIUM SUCC 40 MG IJ SOLR
10.0000 mg | Freq: Once | INTRAMUSCULAR | Status: AC
  Administered 2024-04-23: 10 mg via INTRAVENOUS

## 2024-04-23 MED ORDER — DIPHENHYDRAMINE HCL 25 MG PO CAPS
ORAL_CAPSULE | ORAL | Status: AC
  Filled 2024-04-23: qty 1

## 2024-04-23 MED FILL — Immune Globulin (Human) IV Soln 5 GM/50ML: INTRAVENOUS | Qty: 50 | Status: AC

## 2024-04-23 MED FILL — Immune Globulin (Human) IV Soln 10 GM/100ML: INTRAVENOUS | Qty: 100 | Status: AC

## 2024-04-23 MED FILL — Immune Globulin (Human) IV Soln 20 GM/200ML: INTRAVENOUS | Qty: 200 | Status: AC

## 2024-04-24 ENCOUNTER — Ambulatory Visit
Admission: RE | Admit: 2024-04-24 | Discharge: 2024-04-24 | Disposition: A | Discharge status: Home / Self Care | Source: Ambulatory Visit | Attending: Rheumatology | Admitting: Rheumatology

## 2024-04-24 DIAGNOSIS — M3320 Polymyositis, organ involvement unspecified: Secondary | ICD-10-CM | POA: Diagnosis present

## 2024-04-24 MED ORDER — METHYLPREDNISOLONE SODIUM SUCC 40 MG IJ SOLR
10.0000 mg | Freq: Once | INTRAMUSCULAR | Status: AC
  Administered 2024-04-24: 10 mg via INTRAVENOUS

## 2024-04-24 MED ORDER — ACETAMINOPHEN 325 MG PO TABS
650.0000 mg | ORAL_TABLET | Freq: Once | ORAL | Status: AC
  Administered 2024-04-24: 650 mg via ORAL

## 2024-04-24 MED ORDER — IMMUNE GLOBULIN (HUMAN) 10 GM/100ML IV SOLN
500.0000 mg/kg | Freq: Once | INTRAVENOUS | Status: AC
  Administered 2024-04-24: 35 g via INTRAVENOUS
  Filled 2024-04-24: qty 350

## 2024-04-24 MED ORDER — DIPHENHYDRAMINE HCL 25 MG PO CAPS
ORAL_CAPSULE | ORAL | Status: AC
  Filled 2024-04-24: qty 1

## 2024-04-24 MED ORDER — DIPHENHYDRAMINE HCL 25 MG PO CAPS
25.0000 mg | ORAL_CAPSULE | Freq: Once | ORAL | Status: AC
  Administered 2024-04-24: 25 mg via ORAL

## 2024-04-24 MED ORDER — METHYLPREDNISOLONE SODIUM SUCC 40 MG IJ SOLR
INTRAMUSCULAR | Status: AC
  Filled 2024-04-24: qty 1

## 2024-04-24 MED ORDER — ACETAMINOPHEN 325 MG PO TABS
ORAL_TABLET | ORAL | Status: AC
Start: 2024-04-24 — End: ?
  Filled 2024-04-24: qty 2

## 2024-04-24 MED FILL — Immune Globulin (Human) IV Soln 5 GM/50ML: INTRAVENOUS | Qty: 50 | Status: AC

## 2024-04-24 MED FILL — Immune Globulin (Human) IV Soln 20 GM/200ML: INTRAVENOUS | Qty: 200 | Status: AC

## 2024-04-24 MED FILL — Immune Globulin (Human) IV Soln 10 GM/100ML: INTRAVENOUS | Qty: 100 | Status: AC

## 2024-04-24 MED FILL — Immune Globulin (Human) IV Soln 10 GM/100ML: INTRAVENOUS | Qty: 300 | Status: AC

## 2024-04-25 ENCOUNTER — Encounter: Payer: Self-pay | Admitting: Internal Medicine

## 2024-04-25 ENCOUNTER — Ambulatory Visit: Admitting: Internal Medicine

## 2024-04-25 VITALS — BP 122/80 | HR 93 | Temp 98.0°F | Ht 66.5 in | Wt 180.0 lb

## 2024-04-25 DIAGNOSIS — J301 Allergic rhinitis due to pollen: Secondary | ICD-10-CM | POA: Insufficient documentation

## 2024-04-25 NOTE — Progress Notes (Signed)
 Subjective:    Patient ID: Bridget Romero, female    DOB: Aug 14, 1946, 78 y.o.   MRN: [id number]  HPI Here due to respiratory symptoms Thinks it is allergies--though hasn't had before  Runny nose and eyes--and itching Scratchy throat Slight cough No fever and doesn't feel sick No SOB  Tried sudafed---dried her up  Current Outpatient Medications on File Prior to Visit  Medication Sig Dispense Refill   Accu-Chek FastClix Lancets MISC USE TO TEST BLOOD SUGAR TWICE DAILY DX E11.40 102 each 11   ACCU-CHEK SMARTVIEW test strip USE AS DIRECTED TO CHECK BLOOD SUGAR TWICE DAILY 100 each 6   acetaminophen  (TYLENOL ) 500 MG tablet Take 500 mg by mouth every 6 (six) hours as needed.     Alcohol Swabs (B-D SINGLE USE SWABS REGULAR) PADS Use to test blood sugar once daily dx: 250.00 100 each 3   BD SYRINGE SLIP TIP 25G X 5/8" 1 ML MISC      Blood Glucose Calibration (ACCU-CHEK AVIVA) SOLN Use as directed 1 each 3   Cholecalciferol (VITAMIN D ) 1000 UNITS capsule Take 1,000 Units by mouth daily.     ferrous sulfate 325 (65 FE) MG tablet Take 325 mg by mouth daily with breakfast.     folic acid  (FOLVITE ) 1 MG tablet Take 1 mg by mouth daily.     gabapentin (NEURONTIN) 300 MG capsule Take 1 capsule by mouth 3 (three) times daily.     Insulin  Syringe-Needle U-100 31G X 5/16" 1 ML MISC by Does not apply route.     LANTUS  100 UNIT/ML injection INJECT TEN TO 20 UNITS INTO THE SKIN DAILY AS DIRECTED (VIAL IS GOOD FOR 28 DAYS AFTER FIRST USE) 10 mL 3   lisinopril  (ZESTRIL ) 10 MG tablet TAKE ONE TABLET BY MOUTH ONCE A DAY 90 tablet 3   magnesium oxide (MAG-OX) 400 (240 Mg) MG tablet Take 400 mg by mouth daily.     metFORMIN  (GLUCOPHAGE ) 1000 MG tablet TAKE ONE TABLET BY MOUTH TWICE A DAY 180 tablet 3   methotrexate 250 MG/10ML injection Inject into the skin.      Multiple Vitamin (MULTIVITAMIN) tablet Take 1 tablet by mouth daily.     nystatin  (MYCOSTATIN /NYSTOP ) powder Apply 1 Application topically  3 (three) times daily. 60 g 2   predniSONE  (DELTASONE ) 2.5 MG tablet Take 2.5 mg by mouth daily.     tiZANidine  (ZANAFLEX ) 2 MG tablet Take 1-2 tablets (2-4 mg total) by mouth at bedtime as needed for muscle spasms. 30 tablet 0   trospium  (SANCTURA ) 20 MG tablet Take 1 tab 1 hour prior to bedtime 30 tablet 11   TRUEPLUS INSULIN  SYRINGE 31G X 5/16" 0.5 ML MISC USE ONE SYRINGE DAILY TO INJECT LANTUS  UNDER THE SKIN. 100 each 4   No current facility-administered medications on file prior to visit.    Allergies  Allergen Reactions   Atorvastatin  Other (See Comments)    myalgias   Pravastatin  Other (See Comments)    Muscle pain    Past Medical History:  Diagnosis Date   Allergy    Asthma    Diabetes mellitus    Hyperlipidemia    Hypertension    Neuromuscular disorder (HCC)    polymyositis   Osteoarthritis    Vitamin B12 deficiency     Past Surgical History:  Procedure Laterality Date   COLONOSCOPY WITH PROPOFOL  N/A 03/07/2016   Procedure: COLONOSCOPY WITH PROPOFOL ;  Surgeon: Deveron Fly, MD;  Location: [facility] ENDOSCOPY;  Service: Endoscopy;  Laterality: N/A;    Family History  Problem Relation Age of Onset   Heart disease Mother    Cancer Father    Breast cancer Neg Hx     Social History   Socioeconomic History   Marital status: Widowed    Spouse name: Not on file   Number of children: 2   Years of education: Not on file   Highest education level: Not on file  Occupational History   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 [employer]    Comment:    Tobacco Use   Smoking status: Former    Passive exposure: Never   Smokeless tobacco: Never   Tobacco comments:    age 25 when stopped smoking  Vaping Use   Vaping status: Never Used  Substance and Sexual Activity   Alcohol use: No   Drug use: No   Sexual activity: Not on file  Other Topics Concern   Not on file  Social History Narrative   has living will   Son and daughter should make health care  decisions for her   Would accept resuscitation   Not sure about tube feeds--but not excited about this   Social Drivers of Corporate investment banker Strain: Not on file  Food Insecurity: Not on file  Transportation Needs: Not on file  Physical Activity: Not on file  Stress: Not on file  Social Connections: Not on file  Intimate Partner Violence: Not on file   Review of Systems Fatigue feeling is better No N/V Eating okay     Objective:   Physical Exam Constitutional:      Appearance: Normal appearance.  HENT:     Head:     Comments: No sinus tenderness    Right Ear: Tympanic membrane and ear canal normal.     Left Ear: Tympanic membrane and ear canal normal.     Nose:     Comments: Marked pale nasal congestion    Mouth/Throat:     Pharynx: No oropharyngeal exudate or posterior oropharyngeal erythema.  Pulmonary:     Effort: Pulmonary effort is normal.     Breath sounds: Normal breath sounds. No wheezing or rales.  Musculoskeletal:     Cervical back: Neck supple.  Lymphadenopathy:     Cervical: No cervical adenopathy.  Neurological:     Mental Status: She is alert.            Assessment & Plan:

## 2024-04-25 NOTE — Patient Instructions (Signed)
 Please try over the counter cetirizine 10mg  nightly at bedtime till your allergy symptoms fade away (likely the end of the month)

## 2024-04-25 NOTE — Assessment & Plan Note (Signed)
 Not a life long sufferer but it does seem allergic and not infectious Recommended tylenol  prn and nightly cetirizine 10mg  till symptoms die down

## 2024-05-07 ENCOUNTER — Encounter: Payer: Self-pay | Admitting: Internal Medicine

## 2024-05-07 ENCOUNTER — Inpatient Hospital Stay: Payer: Medicare PPO | Attending: Internal Medicine

## 2024-05-07 ENCOUNTER — Inpatient Hospital Stay: Payer: Medicare PPO | Admitting: Internal Medicine

## 2024-05-07 ENCOUNTER — Inpatient Hospital Stay: Payer: Medicare PPO

## 2024-05-07 DIAGNOSIS — M3320 Polymyositis, organ involvement unspecified: Secondary | ICD-10-CM | POA: Insufficient documentation

## 2024-05-07 DIAGNOSIS — D649 Anemia, unspecified: Secondary | ICD-10-CM | POA: Insufficient documentation

## 2024-05-07 DIAGNOSIS — Z87891 Personal history of nicotine dependence: Secondary | ICD-10-CM | POA: Diagnosis not present

## 2024-05-07 LAB — CBC WITH DIFFERENTIAL (CANCER CENTER ONLY)
Abs Immature Granulocytes: 0.02 10*3/uL (ref 0.00–0.07)
Basophils Absolute: 0 10*3/uL (ref 0.0–0.1)
Basophils Relative: 1 %
Eosinophils Absolute: 0.1 10*3/uL (ref 0.0–0.5)
Eosinophils Relative: 2 %
HCT: 28.7 % — ABNORMAL LOW (ref 36.0–46.0)
Hemoglobin: 9.2 g/dL — ABNORMAL LOW (ref 12.0–15.0)
Immature Granulocytes: 0 %
Lymphocytes Relative: 17 %
Lymphs Abs: 1 10*3/uL (ref 0.7–4.0)
MCH: 29.1 pg (ref 26.0–34.0)
MCHC: 32.1 g/dL (ref 30.0–36.0)
MCV: 90.8 fL (ref 80.0–100.0)
Monocytes Absolute: 0.4 10*3/uL (ref 0.1–1.0)
Monocytes Relative: 6 %
Neutro Abs: 4.4 10*3/uL (ref 1.7–7.7)
Neutrophils Relative %: 74 %
Platelet Count: 295 10*3/uL (ref 150–400)
RBC: 3.16 MIL/uL — ABNORMAL LOW (ref 3.87–5.11)
RDW: 13.5 % (ref 11.5–15.5)
WBC Count: 6 10*3/uL (ref 4.0–10.5)
nRBC: 0 % (ref 0.0–0.2)

## 2024-05-07 LAB — CMP (CANCER CENTER ONLY)
ALT: 31 U/L (ref 0–44)
AST: 35 U/L (ref 15–41)
Albumin: 3.1 g/dL — ABNORMAL LOW (ref 3.5–5.0)
Alkaline Phosphatase: 52 U/L (ref 38–126)
Anion gap: 9 (ref 5–15)
BUN: 23 mg/dL (ref 8–23)
CO2: 20 mmol/L — ABNORMAL LOW (ref 22–32)
Calcium: 9 mg/dL (ref 8.9–10.3)
Chloride: 106 mmol/L (ref 98–111)
Creatinine: 0.9 mg/dL (ref 0.44–1.00)
GFR, Estimated: >60 mL/min (ref >60)
Glucose, Bld: 220 mg/dL — ABNORMAL HIGH (ref 70–99)
Potassium: 3.9 mmol/L (ref 3.5–5.1)
Sodium: 135 mmol/L (ref 135–145)
Total Bilirubin: 0.6 mg/dL (ref 0.0–1.2)
Total Protein: 7.6 g/dL (ref 6.5–8.1)

## 2024-05-07 LAB — VITAMIN B12: Vitamin B-12: 320 pg/mL (ref 180–914)

## 2024-05-07 LAB — FOLATE: Folate: >40 ng/mL (ref >5.9)

## 2024-05-07 LAB — LACTATE DEHYDROGENASE: LDH: 163 U/L (ref 98–192)

## 2024-05-07 NOTE — Assessment & Plan Note (Addendum)
#   Normocytic anemia: chronic Hb ~9-10. continue gentle iron  1 pill a day. March 2024- I sat- 17%  # However today Hb 9-10  stable-  Unclear etiology; myeloma panel-MM-NEG; K/l= 1.7.  repeat pending today. Will check every 12 months- order at next visit.  Again discussed the possible bone marrow biopsy, but hold of for now.  Patient states that she will consider if Hb goes further low. No obvious concern for hemolysis.  # Polymyositis [Dr. [facility]]; Clinically, she is doing well on IVIG 2 g/kg every 6 weeks [Short stay; per Rheumatology]; also prednisone  2.5 mg a day and MTX 10 mg SQ weekly-  Dr.Patel; Rheum- stable.   # Disposition:  # no venofer  today # follow up in 6 months- MD; cbc/bmp; LDH; iron  studies; ferrtin B12; folic acid ; possible venofer - Dr.B.

## 2024-05-07 NOTE — Progress Notes (Signed)
 Patient is doing ok, no new questions or concerns for the doctor today. She has noticed her appetite decreasing recently, she just doesn't find food appetizing.

## 2024-05-07 NOTE — Progress Notes (Signed)
 [facility] CONSULT NOTE  Patient Care Team: Helaine Llanos, MD as PCP - General (Internal Medicine) Chares Commons., MD (Rheumatology) Bridget Leos, MD as Consulting Physician (Internal Medicine)  CHIEF COMPLAINTS/PURPOSE OF CONSULTATION: Polymyositis/; Anemia  #Polymyositis [Dr. Ivette Marks; 2019; left arm biopsy]; low dose prednisone /methotrexate/IVIG infusions 400 mg kilogram daily x4 days every 6 weeks [facility]; Dr.Patel]  Oncology History   No history exists.    HISTORY OF PRESENTING ILLNESS: Alone.  Walks independently.  Bridget Romero 78 y.o.  female with a history of polymyositis currently on IVIG [as per Rheumatology] is here for follow-up of anemia.  patient is doing ok, no new questions or concerns for the doctor today. She has noticed her appetite decreasing recently, she just doesn't find food appetizing   Stools are black from oral iron . No nausea or diarrhea. She denies any blood in stools.  Denies any blood in urine. Patient denies any worsening weakness in arms or legs.   Review of Systems  Constitutional:  Positive for malaise/fatigue. Negative for chills, diaphoresis, fever and weight loss.  HENT:  Negative for nosebleeds and sore throat.   Eyes:  Negative for double vision.  Respiratory:  Negative for cough, hemoptysis, sputum production, shortness of breath and wheezing.   Cardiovascular:  Negative for chest pain, palpitations, orthopnea and leg swelling.  Gastrointestinal:  Negative for abdominal pain, blood in stool, constipation, diarrhea, heartburn, melena, nausea and vomiting.  Genitourinary:  Negative for dysuria, frequency and urgency.  Musculoskeletal:  Negative for back pain and joint pain.  Skin: Negative.  Negative for itching and rash.  Neurological:  Negative for dizziness, tingling, focal weakness, weakness and headaches.  Endo/Heme/Allergies:  Does not bruise/bleed easily.  Psychiatric/Behavioral:   Negative for depression. The patient is not nervous/anxious and does not have insomnia.      MEDICAL HISTORY:  Past Medical History:  Diagnosis Date   Allergy    Asthma    Diabetes mellitus    Hyperlipidemia    Hypertension    Neuromuscular disorder (HCC)    polymyositis   Osteoarthritis    Vitamin B12 deficiency     SURGICAL HISTORY: Past Surgical History:  Procedure Laterality Date   COLONOSCOPY WITH PROPOFOL  N/A 03/07/2016   Procedure: COLONOSCOPY WITH PROPOFOL ;  Surgeon: Deveron Fly, MD;  Location: [facility] ENDOSCOPY;  Service: Endoscopy;  Laterality: N/A;    SOCIAL HISTORY: Social History   Socioeconomic History   Marital status: Widowed    Spouse name: Not on file   Number of children: 2   Years of education: Not on file   Highest education level: Not on file  Occupational History   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 [employer]    Comment:    Tobacco Use   Smoking status: Former    Passive exposure: Never   Smokeless tobacco: Never   Tobacco comments:    age 3 when stopped smoking  Vaping Use   Vaping status: Never Used  Substance and Sexual Activity   Alcohol use: No   Drug use: No   Sexual activity: Not on file  Other Topics Concern   Not on file  Social History Narrative   has living will   Son and daughter should make health care decisions for her   Would accept resuscitation   Not sure about tube feeds--but not excited about this   Social Drivers of Corporate investment banker Strain: Not on file  Food Insecurity: Not  on file  Transportation Needs: Not on file  Physical Activity: Not on file  Stress: Not on file  Social Connections: Not on file  Intimate Partner Violence: Not on file    FAMILY HISTORY: Family History  Problem Relation Age of Onset   Heart disease Mother    Cancer Father    Breast cancer Neg Hx     ALLERGIES:  is allergic to atorvastatin  and pravastatin .  MEDICATIONS:  Current Outpatient  Medications  Medication Sig Dispense Refill   Accu-Chek FastClix Lancets MISC USE TO TEST BLOOD SUGAR TWICE DAILY DX E11.40 102 each 11   ACCU-CHEK SMARTVIEW test strip USE AS DIRECTED TO CHECK BLOOD SUGAR TWICE DAILY 100 each 6   acetaminophen  (TYLENOL ) 500 MG tablet Take 500 mg by mouth every 6 (six) hours as needed.     Alcohol Swabs (B-D SINGLE USE SWABS REGULAR) PADS Use to test blood sugar once daily dx: 250.00 100 each 3   BD SYRINGE SLIP TIP 25G X 5/8" 1 ML MISC      Blood Glucose Calibration (ACCU-CHEK AVIVA) SOLN Use as directed 1 each 3   Cholecalciferol (VITAMIN D ) 1000 UNITS capsule Take 1,000 Units by mouth daily.     ferrous sulfate 325 (65 FE) MG tablet Take 325 mg by mouth daily with breakfast.     folic acid  (FOLVITE ) 1 MG tablet Take 1 mg by mouth daily.     gabapentin (NEURONTIN) 300 MG capsule Take 1 capsule by mouth 3 (three) times daily.     Insulin  Syringe-Needle U-100 31G X 5/16" 1 ML MISC by Does not apply route.     LANTUS  100 UNIT/ML injection INJECT TEN TO 20 UNITS INTO THE SKIN DAILY AS DIRECTED (VIAL IS GOOD FOR 28 DAYS AFTER FIRST USE) 10 mL 3   lisinopril  (ZESTRIL ) 10 MG tablet TAKE ONE TABLET BY MOUTH ONCE A DAY 90 tablet 3   magnesium oxide (MAG-OX) 400 (240 Mg) MG tablet Take 400 mg by mouth daily.     metFORMIN  (GLUCOPHAGE ) 1000 MG tablet TAKE ONE TABLET BY MOUTH TWICE A DAY 180 tablet 3   methotrexate 250 MG/10ML injection Inject into the skin.      Multiple Vitamin (MULTIVITAMIN) tablet Take 1 tablet by mouth daily.     nystatin  (MYCOSTATIN /NYSTOP ) powder Apply 1 Application topically 3 (three) times daily. 60 g 2   predniSONE  (DELTASONE ) 2.5 MG tablet Take 2.5 mg by mouth daily.     tiZANidine  (ZANAFLEX ) 2 MG tablet Take 1-2 tablets (2-4 mg total) by mouth at bedtime as needed for muscle spasms. 30 tablet 0   trospium  (SANCTURA ) 20 MG tablet Take 1 tab 1 hour prior to bedtime 30 tablet 11   TRUEPLUS INSULIN  SYRINGE 31G X 5/16" 0.5 ML MISC USE ONE SYRINGE  DAILY TO INJECT LANTUS  UNDER THE SKIN. 100 each 4   No current facility-administered medications for this visit.      Aaron Aas  PHYSICAL EXAMINATION: ECOG PERFORMANCE STATUS: 0 - Asymptomatic  Vitals:   05/07/24 0950  BP: (!) 140/80  Pulse: 78  Resp: 18  Temp: (!) 97.3 F (36.3 C)  SpO2: 100%   Filed Weights   05/07/24 0950  Weight: 180 lb (81.6 kg)    Physical Exam Vitals and nursing note reviewed.  Constitutional:      Comments:    HENT:     Head: Normocephalic and atraumatic.     Mouth/Throat:     Pharynx: Oropharynx is clear.  Eyes:  Extraocular Movements: Extraocular movements intact.     Pupils: Pupils are equal, round, and reactive to light.  Cardiovascular:     Rate and Rhythm: Normal rate and regular rhythm.  Pulmonary:     Comments: Decreased breath sounds bilaterally.  Abdominal:     Palpations: Abdomen is soft.  Musculoskeletal:        General: Normal range of motion.     Cervical back: Normal range of motion.  Skin:    General: Skin is warm.  Neurological:     General: No focal deficit present.     Mental Status: She is alert and oriented to person, place, and time.  Psychiatric:        Behavior: Behavior normal.        Judgment: Judgment normal.     LABORATORY DATA:  I have reviewed the data as listed Lab Results  Component Value Date   WBC 6.0 05/07/2024   HGB 9.2 (L) 05/07/2024   HCT 28.7 (L) 05/07/2024   MCV 90.8 05/07/2024   PLT 295 05/07/2024   Recent Labs    05/08/23 1254 11/05/23 1256 01/02/24 0933 03/27/24 1114 05/07/24 0954  NA 136 140 142 138 135  K 4.3 4.1 4.2 4.6 3.9  CL 104 106 105 104 106  CO2 24 27 32 27 20*  GLUCOSE 177* 126* 178* 213* 220*  BUN 20 21 18  24* 23  CREATININE 0.86 0.67 0.73 1.04 0.90  CALCIUM  9.2 9.3 9.4 9.9 9.0  GFRNONAA >60 >60  --   --  >60  PROT  --   --  6.4 7.1 7.6  ALBUMIN  --   --  4.0 3.7 3.1*  AST  --   --  32 37 35  ALT  --   --  33 43* 31  ALKPHOS  --   --  56 49 52  BILITOT  --    --  0.6 0.6 0.6    RADIOGRAPHIC STUDIES: I have personally reviewed the radiological images as listed and agreed with the findings in the report. No results found.  ASSESSMENT & PLAN:   Symptomatic anemia # Normocytic anemia: chronic Hb ~9-10. continue gentle iron  1 pill a day. March 2024- I sat- 17%  # However today Hb 9-10  stable-  Unclear etiology; myeloma panel-MM-NEG; K/l= 1.7.  repeat pending today. Will check every 12 months- order at next visit.  Again discussed the possible bone marrow biopsy, but hold of for now.  Patient states that she will consider if Hb goes further low. No obvious concern for hemolysis.  # Polymyositis [Dr. [facility]]; Clinically, she is doing well on IVIG 2 g/kg every 6 weeks [Short stay; per Rheumatology]; also prednisone  2.5 mg a day and MTX 10 mg SQ weekly-  Dr.Patel; Rheum- stable.   # Disposition:  # no venofer  today # follow up in 6 months- MD; cbc/bmp; LDH; iron  studies; ferrtin B12; folic acid ; possible venofer - Dr.B.   All questions were answered. The patient knows to call the clinic with any problems, questions or concerns.    Bridget Leos, MD 05/07/2024 10:36 AM

## 2024-05-08 LAB — KAPPA/LAMBDA LIGHT CHAINS
Kappa free light chain: 37.4 mg/L — ABNORMAL HIGH (ref 3.3–19.4)
Kappa, lambda light chain ratio: 1.61 (ref 0.26–1.65)
Lambda free light chains: 23.2 mg/L (ref 5.7–26.3)

## 2024-05-09 ENCOUNTER — Other Ambulatory Visit: Payer: Self-pay | Admitting: Internal Medicine

## 2024-05-09 NOTE — Telephone Encounter (Signed)
 Per chart review, receipt confirmed to pharmacy today.

## 2024-05-09 NOTE — Telephone Encounter (Signed)
 Copied from CRM [id number]. Topic: Clinical - Medication Refill >> May 09, 2024 11:04 AM Jenice Mitts wrote: Medication:  ACCU-CHEK SMARTVIEW test strip     Has the patient contacted their pharmacy? Yes (Agent: If no, request that the patient contact the pharmacy for the refill. If patient does not wish to contact the pharmacy document the reason why and proceed with request.) (Agent: If yes, when and what did the pharmacy advise?)  This is the patient's preferred pharmacy:  [facility] - [place], [place] - [address] [place] [address] Phone: [phone] Fax: [phone]  Is this the correct pharmacy for this prescription? Yes If no, delete pharmacy and type the correct one.   Has the prescription been filled recently? No  Is the patient out of the medication? Yes  Has the patient been seen for an appointment in the last year OR does the patient have an upcoming appointment? Yes  Can we respond through MyChart? Yes  Agent: Please be advised that Rx refills may take up to 3 business days. We ask that you follow-up with your pharmacy.

## 2024-05-12 LAB — MULTIPLE MYELOMA PANEL, SERUM
Albumin SerPl Elph-Mcnc: 3.3 g/dL (ref 2.9–4.4)
Albumin/Glob SerPl: 0.9 (ref 0.7–1.7)
Alpha 1: 0.2 g/dL (ref 0.0–0.4)
Alpha2 Glob SerPl Elph-Mcnc: 0.7 g/dL (ref 0.4–1.0)
B-Globulin SerPl Elph-Mcnc: 1 g/dL (ref 0.7–1.3)
Gamma Glob SerPl Elph-Mcnc: 2 g/dL — ABNORMAL HIGH (ref 0.4–1.8)
Globulin, Total: 4 g/dL — ABNORMAL HIGH (ref 2.2–3.9)
IgA: 147 mg/dL (ref 64–422)
IgG (Immunoglobin G), Serum: 2269 mg/dL — ABNORMAL HIGH (ref 586–1602)
IgM (Immunoglobulin M), Srm: 92 mg/dL (ref 26–217)
Total Protein ELP: 7.3 g/dL (ref 6.0–8.5)

## 2024-05-14 ENCOUNTER — Other Ambulatory Visit: Payer: Self-pay | Admitting: Internal Medicine

## 2024-05-14 MED ORDER — NYSTATIN 100000 UNIT/GM EX POWD: 1.0000 | Freq: Three times a day (TID) | CUTANEOUS | 2 refills | Status: DC

## 2024-05-14 NOTE — Telephone Encounter (Signed)
 Copied from CRM [id number]. Topic: Clinical - Medication Refill >> May 14, 2024 10:38 AM Martinique E wrote: Medication: nystatin  (MYCOSTATIN /NYSTOP ) powder  Has the patient contacted their pharmacy? No (Agent: If no, request that the patient contact the pharmacy for the refill. If patient does not wish to contact the pharmacy document the reason why and proceed with request.) (Agent: If yes, when and what did the pharmacy advise?)  This is the patient's preferred pharmacy:  [facility] - [place], [place] - [address] [place] [address] Phone: [phone] Fax: [phone]  Is this the correct pharmacy for this prescription? Yes If no, delete pharmacy and type the correct one.   Has the prescription been filled recently? No  Is the patient out of the medication? Yes  Has the patient been seen for an appointment in the last year OR does the patient have an upcoming appointment? Yes  Can we respond through MyChart? No  Agent: Please be advised that Rx refills may take up to 3 business days. We ask that you follow-up with your pharmacy.

## 2024-05-20 NOTE — Addendum Note (Signed)
 Encounter addended by: Richardson Chang, RN on: 05/20/2024 12:17 PM  Actions taken: Charge Capture section accepted

## 2024-05-29 DIAGNOSIS — G720 Drug-induced myopathy: Secondary | ICD-10-CM | POA: Insufficient documentation

## 2024-06-02 ENCOUNTER — Ambulatory Visit
Admission: RE | Admit: 2024-06-02 | Discharge: 2024-06-02 | Disposition: A | Discharge status: Home / Self Care | Source: Ambulatory Visit | Attending: Rheumatology | Admitting: Rheumatology

## 2024-06-02 DIAGNOSIS — M3320 Polymyositis, organ involvement unspecified: Secondary | ICD-10-CM | POA: Diagnosis present

## 2024-06-02 MED ORDER — DIPHENHYDRAMINE HCL 25 MG PO CAPS
25.0000 mg | ORAL_CAPSULE | Freq: Once | ORAL | Status: AC
  Administered 2024-06-02: 25 mg via ORAL

## 2024-06-02 MED ORDER — ACETAMINOPHEN 325 MG PO TABS
ORAL_TABLET | ORAL | Status: AC
  Filled 2024-06-02: qty 2

## 2024-06-02 MED ORDER — ACETAMINOPHEN 325 MG PO TABS
650.0000 mg | ORAL_TABLET | Freq: Once | ORAL | Status: AC
  Administered 2024-06-02: 650 mg via ORAL

## 2024-06-02 MED ORDER — DIPHENHYDRAMINE HCL 25 MG PO CAPS
ORAL_CAPSULE | ORAL | Status: AC
  Filled 2024-06-02: qty 1

## 2024-06-02 MED ORDER — METHYLPREDNISOLONE SODIUM SUCC 40 MG IJ SOLR
10.0000 mg | Freq: Once | INTRAMUSCULAR | Status: AC
  Administered 2024-06-02: 10 mg via INTRAVENOUS

## 2024-06-02 MED ORDER — DIPHENHYDRAMINE HCL 25 MG PO CAPS
ORAL_CAPSULE | ORAL | Status: AC
Start: 2024-06-02 — End: ?
  Filled 2024-06-02: qty 1

## 2024-06-02 MED ORDER — METHYLPREDNISOLONE SODIUM SUCC 40 MG IJ SOLR
INTRAMUSCULAR | Status: AC
  Filled 2024-06-02: qty 1

## 2024-06-02 MED ORDER — IMMUNE GLOBULIN (HUMAN) 10 GM/100ML IV SOLN
500.0000 mg/kg | INTRAVENOUS | Status: DC
  Administered 2024-06-02: 40 g via INTRAVENOUS
  Filled 2024-06-02: qty 400

## 2024-06-03 ENCOUNTER — Ambulatory Visit
Admission: RE | Admit: 2024-06-03 | Discharge: 2024-06-03 | Disposition: A | Discharge status: Home / Self Care | Source: Ambulatory Visit | Attending: Rheumatology | Admitting: Rheumatology

## 2024-06-03 DIAGNOSIS — M3320 Polymyositis, organ involvement unspecified: Secondary | ICD-10-CM | POA: Insufficient documentation

## 2024-06-03 DIAGNOSIS — Z01818 Encounter for other preprocedural examination: Secondary | ICD-10-CM | POA: Diagnosis present

## 2024-06-03 MED ORDER — METHYLPREDNISOLONE SODIUM SUCC 40 MG IJ SOLR
10.0000 mg | Freq: Once | INTRAMUSCULAR | Status: AC
  Administered 2024-06-03: 10 mg via INTRAVENOUS

## 2024-06-03 MED ORDER — DIPHENHYDRAMINE HCL 25 MG PO CAPS
ORAL_CAPSULE | ORAL | Status: AC
Start: 2024-06-03 — End: ?
  Filled 2024-06-03: qty 1

## 2024-06-03 MED ORDER — ACETAMINOPHEN 325 MG PO TABS
650.0000 mg | ORAL_TABLET | Freq: Once | ORAL | Status: AC
  Administered 2024-06-03: 650 mg via ORAL

## 2024-06-03 MED ORDER — DIPHENHYDRAMINE HCL 25 MG PO CAPS
25.0000 mg | ORAL_CAPSULE | Freq: Once | ORAL | Status: AC
  Administered 2024-06-03: 25 mg via ORAL

## 2024-06-03 MED ORDER — METHYLPREDNISOLONE SODIUM SUCC 40 MG IJ SOLR
INTRAMUSCULAR | Status: AC
  Filled 2024-06-03: qty 1

## 2024-06-03 MED ORDER — IMMUNE GLOBULIN (HUMAN) 10 GM/100ML IV SOLN
40.0000 g | INTRAVENOUS | Status: DC
  Administered 2024-06-03: 40 g via INTRAVENOUS
  Filled 2024-06-03: qty 400

## 2024-06-03 MED ORDER — ACETAMINOPHEN 325 MG PO TABS
ORAL_TABLET | ORAL | Status: AC
  Filled 2024-06-03: qty 2

## 2024-06-04 ENCOUNTER — Ambulatory Visit
Admission: RE | Admit: 2024-06-04 | Discharge: 2024-06-04 | Disposition: A | Discharge status: Home / Self Care | Source: Ambulatory Visit | Attending: Rheumatology | Admitting: Rheumatology

## 2024-06-04 DIAGNOSIS — M3322 Polymyositis with myopathy: Secondary | ICD-10-CM | POA: Diagnosis not present

## 2024-06-04 MED ORDER — ACETAMINOPHEN 325 MG PO TABS
ORAL_TABLET | ORAL | Status: AC
Start: 2024-06-04 — End: 2024-06-04
  Filled 2024-06-04: qty 2

## 2024-06-04 MED ORDER — METHYLPREDNISOLONE SODIUM SUCC 40 MG IJ SOLR
INTRAMUSCULAR | Status: AC
Start: 2024-06-04 — End: 2024-06-04
  Filled 2024-06-04: qty 1

## 2024-06-04 MED ORDER — DIPHENHYDRAMINE HCL 25 MG PO CAPS
25.0000 mg | ORAL_CAPSULE | Freq: Once | ORAL | Status: AC
  Administered 2024-06-04: 25 mg via ORAL

## 2024-06-04 MED ORDER — METHYLPREDNISOLONE SODIUM SUCC 40 MG IJ SOLR
10.0000 mg | Freq: Once | INTRAMUSCULAR | Status: AC
  Administered 2024-06-04: 10 mg via INTRAVENOUS

## 2024-06-04 MED ORDER — ACETAMINOPHEN 325 MG PO TABS
650.0000 mg | ORAL_TABLET | Freq: Once | ORAL | Status: AC
  Administered 2024-06-04: 650 mg via ORAL

## 2024-06-04 MED ORDER — IMMUNE GLOBULIN (HUMAN) 10 GM/100ML IV SOLN
40.0000 g | INTRAVENOUS | Status: DC
  Administered 2024-06-04: 40 g via INTRAVENOUS
  Filled 2024-06-04 (×2): qty 400

## 2024-06-04 MED ORDER — DIPHENHYDRAMINE HCL 25 MG PO CAPS
ORAL_CAPSULE | ORAL | Status: AC
  Filled 2024-06-04: qty 1

## 2024-06-05 ENCOUNTER — Ambulatory Visit
Admission: RE | Admit: 2024-06-05 | Discharge: 2024-06-05 | Disposition: A | Discharge status: Home / Self Care | Source: Ambulatory Visit | Attending: Rheumatology | Admitting: Rheumatology

## 2024-06-05 DIAGNOSIS — M3322 Polymyositis with myopathy: Secondary | ICD-10-CM | POA: Diagnosis not present

## 2024-06-05 MED ORDER — ACETAMINOPHEN 325 MG PO TABS
ORAL_TABLET | ORAL | Status: AC
  Filled 2024-06-05: qty 2

## 2024-06-05 MED ORDER — IMMUNE GLOBULIN (HUMAN) 10 GM/100ML IV SOLN
500.0000 mg/kg | Freq: Once | INTRAVENOUS | Status: AC
  Administered 2024-06-05: 35 g via INTRAVENOUS
  Filled 2024-06-05 (×2): qty 350

## 2024-06-05 MED ORDER — METHYLPREDNISOLONE SODIUM SUCC 40 MG IJ SOLR
INTRAMUSCULAR | Status: AC
Start: 2024-06-05 — End: 2024-06-05
  Filled 2024-06-05: qty 1

## 2024-06-05 MED ORDER — ACETAMINOPHEN 325 MG PO TABS
650.0000 mg | ORAL_TABLET | Freq: Once | ORAL | Status: AC
  Administered 2024-06-05: 650 mg via ORAL

## 2024-06-05 MED ORDER — METHYLPREDNISOLONE SODIUM SUCC 40 MG IJ SOLR
10.0000 mg | Freq: Once | INTRAMUSCULAR | Status: AC
  Administered 2024-06-05: 10 mg via INTRAVENOUS

## 2024-06-05 MED ORDER — DIPHENHYDRAMINE HCL 25 MG PO CAPS
ORAL_CAPSULE | ORAL | Status: AC
  Filled 2024-06-05: qty 1

## 2024-06-05 MED ORDER — DIPHENHYDRAMINE HCL 25 MG PO CAPS
25.0000 mg | ORAL_CAPSULE | Freq: Once | ORAL | Status: AC
  Administered 2024-06-05: 25 mg via ORAL

## 2024-06-06 MED FILL — Immune Globulin (Human) IV Soln 5 GM/50ML: INTRAVENOUS | Qty: 50 | Status: AC

## 2024-06-06 MED FILL — Immune Globulin (Human) IV Soln 20 GM/200ML: INTRAVENOUS | Qty: 200 | Status: AC

## 2024-06-06 MED FILL — Immune Globulin (Human) IV Soln 10 GM/100ML: INTRAVENOUS | Qty: 100 | Status: AC

## 2024-06-20 ENCOUNTER — Ambulatory Visit: Admitting: Physician Assistant

## 2024-06-20 VITALS — BP 178/76 | HR 98 | Ht 66.0 in | Wt 181.0 lb

## 2024-06-20 DIAGNOSIS — R3989 Other symptoms and signs involving the genitourinary system: Secondary | ICD-10-CM | POA: Diagnosis not present

## 2024-06-20 DIAGNOSIS — R3912 Poor urinary stream: Secondary | ICD-10-CM | POA: Diagnosis not present

## 2024-06-20 DIAGNOSIS — R351 Nocturia: Secondary | ICD-10-CM | POA: Diagnosis not present

## 2024-06-20 LAB — URINALYSIS, COMPLETE
Bilirubin, UA: NEGATIVE
Glucose, UA: NEGATIVE
Ketones, UA: NEGATIVE
Leukocytes,UA: NEGATIVE
Nitrite, UA: NEGATIVE
Protein,UA: NEGATIVE
RBC, UA: NEGATIVE
Specific Gravity, UA: 1.025 (ref 1.005–1.030)
Urobilinogen, Ur: 0.2 mg/dL (ref 0.2–1.0)
pH, UA: 5.5 (ref 5.0–7.5)

## 2024-06-20 LAB — MICROSCOPIC EXAMINATION

## 2024-06-20 LAB — BLADDER SCAN AMB NON-IMAGING

## 2024-06-20 MED ORDER — TAMSULOSIN HCL 0.4 MG PO CAPS: 0.4000 mg | ORAL_CAPSULE | Freq: Every day | ORAL | 5 refills | Status: DC

## 2024-06-20 NOTE — Progress Notes (Signed)
 06/20/2024 2:53 PM   Bridget Romero 1946-02-14 [id number]  CC: Chief Complaint  Patient presents with   Nocturia   HPI: Bridget Romero is a 78 y.o. female with PMH uterine for [place]li[place], DM, and nocturia on nighttime trospium  20 mg who presents today for evaluation of weak stream.   Today she reports she noticed a weak urinary stream a couple of weeks ago, which is since resolved.  She is now back to baseline.  It was not associated with dysuria or gross hematuria.  She denies constipation at the time.  Notably, she had recently moved and been doing more heavy lifting.  In-office UA and microscopy pan negative. PVR 19mL.  PMH: Past Medical History:  Diagnosis Date   Allergy    Asthma    Diabetes mellitus    Hyperlipidemia    Hypertension    Neuromuscular disorder (HCC)    polymyositis   Osteoarthritis    Vitamin B12 deficiency     Surgical History: Past Surgical History:  Procedure Laterality Date   CESAREAN SECTION     COLONOSCOPY WITH PROPOFOL  N/A 03/07/2016   Procedure: COLONOSCOPY WITH PROPOFOL ;  Surgeon: Bridget RAYMOND Mariner, MD;  Location: [facility] ENDOSCOPY;  Service: Endoscopy;  Laterality: N/A;    Home Medications:  Allergies as of 06/20/2024       Reactions   Atorvastatin  Other (See Comments)   myalgias   Pravastatin  Other (See Comments)   Muscle pain        Medication List        Accurate as of June 20, 2024  2:53 PM. If you have any questions, ask your nurse or doctor.          Accu-Chek Aviva Soln Use as directed   Accu-Chek FastClix Lancets Misc USE TO TEST BLOOD SUGAR TWICE DAILY DX E11.40   Accu-Chek SmartView test strip Generic drug: glucose blood USE AS DIRECTED TO CHECK BLOOD SUGAR TWICE DAILY   acetaminophen  500 MG tablet Commonly known as: TYLENOL  Take 500 mg by mouth every 6 (six) hours as needed.   B-D SINGLE USE SWABS REGULAR Pads Use to test blood sugar once daily dx: 250.00   BD Syringe Slip Tip 25G X 5/8 1  ML Misc Generic drug: TUBERCULIN SYR 1CC/25GX5/8   ferrous sulfate 325 (65 FE) MG tablet Take 325 mg by mouth daily with breakfast.   folic acid  1 MG tablet Commonly known as: FOLVITE  Take 1 mg by mouth daily.   gabapentin 300 MG capsule Commonly known as: NEURONTIN Take 1 capsule by mouth 3 (three) times daily.   Insulin  Syringe-Needle U-100 31G X 5/16 1 ML Misc by Does not apply route.   TRUEplus Insulin  Syringe 31G X 5/16 0.5 ML Misc Generic drug: Insulin  Syringe-Needle U-100 USE ONE SYRINGE DAILY TO INJECT LANTUS  UNDER THE SKIN.   Lantus  100 UNIT/ML injection Generic drug: insulin  glargine INJECT TEN TO 20 UNITS INTO THE SKIN DAILY AS DIRECTED (VIAL IS GOOD FOR 28 DAYS AFTER FIRST USE)   lisinopril  10 MG tablet Commonly known as: ZESTRIL  TAKE ONE TABLET BY MOUTH ONCE A DAY   metFORMIN  1000 MG tablet Commonly known as: GLUCOPHAGE  TAKE ONE TABLET BY MOUTH TWICE A DAY   methotrexate 250 MG/10ML injection Inject into the skin once a week.   multivitamin tablet Take 1 tablet by mouth daily.   nystatin  powder Commonly known as: MYCOSTATIN /NYSTOP  Apply 1 Application topically 3 (three) times daily. What changed:  when to take this reasons to take  this   predniSONE  2.5 MG tablet Commonly known as: DELTASONE  Take 2.5 mg by mouth daily.   tamsulosin 0.4 MG Caps capsule Commonly known as: FLOMAX Take 1 capsule (0.4 mg total) by mouth daily. Started by: Rogena Deupree   tiZANidine  2 MG tablet Commonly known as: ZANAFLEX  Take 1-2 tablets (2-4 mg total) by mouth at bedtime as needed for muscle spasms.   trospium  20 MG tablet Commonly known as: SANCTURA  Take 1 tab 1 hour prior to bedtime   Vitamin D  1000 units capsule Take 1,000 Units by mouth daily.        Allergies:  Allergies  Allergen Reactions   Atorvastatin  Other (See Comments)    myalgias   Pravastatin  Other (See Comments)    Muscle pain    Family History: Family History  Problem  Relation Age of Onset   Heart disease Mother    Cancer Father    Breast cancer Neg Hx     Social History:   reports that she has quit smoking. She has never been exposed to tobacco smoke. She has never used smokeless tobacco. She reports that she does not drink alcohol and does not use drugs.  Physical Exam: BP (!) 178/76   Pulse 98   Ht 5' 6 (1.676 m)   Wt 181 lb (82.1 kg)   BMI 29.21 kg/m   Constitutional:  Alert and oriented, no acute distress, nontoxic appearing HEENT: [place], AT Cardiovascular: No clubbing, cyanosis, or edema Respiratory: Normal respiratory effort, no increased work of breathing Skin: No rashes, bruises or suspicious lesions Neurologic: Grossly intact, no focal deficits, moving all 4 extremities Psychiatric: Normal mood and affect  Laboratory Data: Results for orders placed or performed in visit on 06/20/24  Microscopic Examination   Collection Time: 06/20/24  2:32 PM   Urine  Result Value Ref Range   WBC, UA 0-5 0 - 5 /hpf   RBC, Urine 0-2 0 - 2 /hpf   Epithelial Cells (non renal) 0-10 0 - 10 /hpf   Bacteria, UA Few None seen/Few  Urinalysis, Complete   Collection Time: 06/20/24  2:32 PM  Result Value Ref Range   Specific Gravity, UA 1.025 1.005 - 1.030   pH, UA 5.5 5.0 - 7.5   Color, UA Yellow Yellow   Appearance Ur Clear Clear   Leukocytes,UA Negative Negative   Protein,UA Negative Negative/Trace   Glucose, UA Negative Negative   Ketones, UA Negative Negative   RBC, UA Negative Negative   Bilirubin, UA Negative Negative   Urobilinogen, Ur 0.2 0.2 - 1.0 mg/dL   Nitrite, UA Negative Negative   Microscopic Examination Comment    Microscopic Examination See below:   BLADDER SCAN AMB NON-IMAGING   Collection Time: 06/20/24  2:33 PM  Result Value Ref Range   Scan Result 19ml    Assessment & Plan:   1. Weak urinary stream (Primary) UA bland and she is emptying appropriately.  Weak stream has resolved.  Unclear etiology, possible pelvic floor  tension versus anticholinergic side effect, though I think the latter is less likely since she remains on trospium  and her symptoms have resolved.  I gave her Flomax to try at her convenience.  May also try holding the trospium  in the future if this persists. - Urinalysis, Complete - BLADDER SCAN AMB NON-IMAGING - tamsulosin (FLOMAX) 0.4 MG CAPS capsule; Take 1 capsule (0.4 mg total) by mouth daily.  Dispense: 30 capsule; Refill: 5  Return if symptoms worsen or fail to improve.  Lucie Hones, PA-C  [facility] [place] [address] [place], [place] [address] [phone]

## 2024-06-24 ENCOUNTER — Telehealth: Payer: Self-pay | Admitting: Internal Medicine

## 2024-06-24 NOTE — Telephone Encounter (Signed)
 Patient dropped of form for completion by provider placed in providers box at front desk

## 2024-06-25 NOTE — Telephone Encounter (Signed)
Form placed in Dr Letvak's inbox on his desk 

## 2024-06-26 NOTE — Telephone Encounter (Signed)
 Spoke to pt. Advised her the form was ready for pickup. She has an appt with Dr Jimmy on 07-01-24. She will get it then. I placed the form in the [facility] Folder in my black stacker on my desk.

## 2024-07-01 ENCOUNTER — Ambulatory Visit: Payer: Medicare PPO | Admitting: Internal Medicine

## 2024-07-01 ENCOUNTER — Encounter: Payer: Self-pay | Admitting: Internal Medicine

## 2024-07-01 VITALS — BP 122/78 | HR 98 | Temp 98.5°F | Ht 66.0 in | Wt 181.2 lb

## 2024-07-01 DIAGNOSIS — M3322 Polymyositis with myopathy: Secondary | ICD-10-CM | POA: Diagnosis not present

## 2024-07-01 DIAGNOSIS — E1140 Type 2 diabetes mellitus with diabetic neuropathy, unspecified: Secondary | ICD-10-CM

## 2024-07-01 DIAGNOSIS — Z794 Long term (current) use of insulin: Secondary | ICD-10-CM

## 2024-07-01 DIAGNOSIS — I10 Essential (primary) hypertension: Secondary | ICD-10-CM

## 2024-07-01 DIAGNOSIS — Z7984 Long term (current) use of oral hypoglycemic drugs: Secondary | ICD-10-CM

## 2024-07-01 LAB — HEMOGLOBIN A1C: Hgb A1c MFr Bld: 7.9 % — ABNORMAL HIGH (ref 4.6–6.5)

## 2024-07-01 NOTE — Assessment & Plan Note (Addendum)
 Seems controlled on weekly methotrexate, prednisone  2.5mg  daily  Going back to Dr Tobie next month Right flank pain sounds more like muscle strain--not exaceration

## 2024-07-01 NOTE — Progress Notes (Signed)
 Subjective:    Patient ID: Bridget Romero, female    DOB: Jan 20, 1946, 78 y.o.   MRN: [id number]  HPI Here for follow up of diabetes and other chronic health conditions  Feels she may be lactose intolerance Has been having some fecal urgency after eating---loose stools  May go 2-3 times Not every day Ice cream does cause this  Sister now transitioning On hospice and other family help  Some right flank pain Goes back a while Notices it after getting up from bed--improves with walking around Relates it to her polymyositis Still on weekly methotrexate injections Going back to rheum next month  Checks sugars daily--sometimes bid Usually 120-130----highest usually 140's No low sugar reactions Occ holds the insulin  if sugar low  No chest pain No SOB  Current Outpatient Medications on File Prior to Visit  Medication Sig Dispense Refill   Accu-Chek FastClix Lancets MISC USE TO TEST BLOOD SUGAR TWICE DAILY DX E11.40 102 each 11   ACCU-CHEK SMARTVIEW test strip USE AS DIRECTED TO CHECK BLOOD SUGAR TWICE DAILY 100 each 6   acetaminophen  (TYLENOL ) 500 MG tablet Take 500 mg by mouth every 6 (six) hours as needed.     Alcohol Swabs (B-D SINGLE USE SWABS REGULAR) PADS Use to test blood sugar once daily dx: 250.00 100 each 3   BD SYRINGE SLIP TIP 25G X 5/8 1 ML MISC      Blood Glucose Calibration (ACCU-CHEK AVIVA) SOLN Use as directed 1 each 3   Cholecalciferol (VITAMIN D ) 1000 UNITS capsule Take 1,000 Units by mouth daily.     ferrous sulfate 325 (65 FE) MG tablet Take 325 mg by mouth daily with breakfast.     folic acid  (FOLVITE ) 1 MG tablet Take 1 mg by mouth daily.     gabapentin (NEURONTIN) 300 MG capsule Take 1 capsule by mouth 3 (three) times daily.     Insulin  Syringe-Needle U-100 31G X 5/16 1 ML MISC by Does not apply route.     LANTUS  100 UNIT/ML injection INJECT TEN TO 20 UNITS INTO THE SKIN DAILY AS DIRECTED (VIAL IS GOOD FOR 28 DAYS AFTER FIRST USE) 10 mL 3    lisinopril  (ZESTRIL ) 10 MG tablet TAKE ONE TABLET BY MOUTH ONCE A DAY 90 tablet 3   metFORMIN  (GLUCOPHAGE ) 1000 MG tablet TAKE ONE TABLET BY MOUTH TWICE A DAY 180 tablet 3   methotrexate 250 MG/10ML injection Inject into the skin once a week.     Multiple Vitamin (MULTIVITAMIN) tablet Take 1 tablet by mouth daily.     nystatin  (MYCOSTATIN /NYSTOP ) powder Apply 1 Application topically 3 (three) times daily. (Patient taking differently: Apply 1 Application topically 3 (three) times daily as needed.) 60 g 2   predniSONE  (DELTASONE ) 2.5 MG tablet Take 2.5 mg by mouth daily.     tamsulosin  (FLOMAX ) 0.4 MG CAPS capsule Take 1 capsule (0.4 mg total) by mouth daily. 30 capsule 5   tiZANidine  (ZANAFLEX ) 2 MG tablet Take 1-2 tablets (2-4 mg total) by mouth at bedtime as needed for muscle spasms. 30 tablet 0   trospium  (SANCTURA ) 20 MG tablet Take 1 tab 1 hour prior to bedtime 30 tablet 11   TRUEPLUS INSULIN  SYRINGE 31G X 5/16 0.5 ML MISC USE ONE SYRINGE DAILY TO INJECT LANTUS  UNDER THE SKIN. 100 each 4   No current facility-administered medications on file prior to visit.    Allergies  Allergen Reactions   Atorvastatin  Other (See Comments)    myalgias   Pravastatin   Other (See Comments)    Muscle pain    Past Medical History:  Diagnosis Date   Allergy    Asthma    Diabetes mellitus    Hyperlipidemia    Hypertension    Neuromuscular disorder (HCC)    polymyositis   Osteoarthritis    Vitamin B12 deficiency     Past Surgical History:  Procedure Laterality Date   CESAREAN SECTION     COLONOSCOPY WITH PROPOFOL  N/A 03/07/2016   Procedure: COLONOSCOPY WITH PROPOFOL ;  Surgeon: Gladis RAYMOND Mariner, MD;  Location: [facility] ENDOSCOPY;  Service: Endoscopy;  Laterality: N/A;    Family History  Problem Relation Age of Onset   Heart disease Mother    Cancer Father    Breast cancer Neg Hx     Social History   Socioeconomic History   Marital status: Widowed    Spouse name: Not on file   Number of  children: 2   Years of education: Not on file   Highest education level: Not on file  Occupational History   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 [employer]    Comment:    Tobacco Use   Smoking status: Former    Passive exposure: Never   Smokeless tobacco: Never   Tobacco comments:    age 65 when stopped smoking  Vaping Use   Vaping status: Never Used  Substance and Sexual Activity   Alcohol use: No   Drug use: No   Sexual activity: Not on file  Other Topics Concern   Not on file  Social History Narrative   has living will   Son and daughter should make health care decisions for her   Would accept resuscitation   Not sure about tube feeds--but not excited about this   Social Drivers of Corporate investment banker Strain: Not on file  Food Insecurity: Not on file  Transportation Needs: Not on file  Physical Activity: Not on file  Stress: Not on file  Social Connections: Not on file  Intimate Partner Violence: Not on file   Review of Systems Appetite is okay Weight stable Sleep is variable    Objective:   Physical Exam Constitutional:      Appearance: Normal appearance.  Cardiovascular:     Rate and Rhythm: Normal rate and regular rhythm.     Pulses: Normal pulses.     Heart sounds: No murmur heard.    No gallop.  Pulmonary:     Effort: Pulmonary effort is normal.     Breath sounds: Normal breath sounds. No wheezing or rales.  Abdominal:     Palpations: Abdomen is soft.     Tenderness: There is no abdominal tenderness.  Musculoskeletal:     Cervical back: Neck supple.     Comments: Trace ankle edema  Lymphadenopathy:     Cervical: No cervical adenopathy.  Neurological:     Mental Status: She is alert.  Psychiatric:        Mood and Affect: Mood normal.        Behavior: Behavior normal.            Assessment & Plan:

## 2024-07-01 NOTE — Assessment & Plan Note (Addendum)
 Still seems to have good control Insulin  10-20 daily, metformin  1000 bid Will check A1c

## 2024-07-01 NOTE — Assessment & Plan Note (Signed)
 BP Readings from Last 3 Encounters:  07/01/24 122/78  06/20/24 (!) 178/76  05/07/24 (!) 140/80   Controlled on lisinopril  10

## 2024-07-01 NOTE — Patient Instructions (Signed)
 Please try lactaid pills before eating ice cream or other dairy products

## 2024-07-02 ENCOUNTER — Ambulatory Visit: Payer: Self-pay | Admitting: Internal Medicine

## 2024-07-07 NOTE — Telephone Encounter (Signed)
 Pt did not get form at OV 07-01-24. She said she will come by and pick it up. I have put it up front.

## 2024-07-07 NOTE — Telephone Encounter (Signed)
 Patient picked up.

## 2024-07-14 ENCOUNTER — Ambulatory Visit

## 2024-07-14 ENCOUNTER — Inpatient Hospital Stay
Admission: RE | Admit: 2024-07-14 | Discharge: 2024-07-14 | Disposition: A | Discharge status: Home / Self Care | Source: Ambulatory Visit | Attending: Internal Medicine | Admitting: Internal Medicine

## 2024-07-14 DIAGNOSIS — M3320 Polymyositis, organ involvement unspecified: Secondary | ICD-10-CM | POA: Insufficient documentation

## 2024-07-14 MED ORDER — IMMUNE GLOBULIN (HUMAN) 10 GM/100ML IV SOLN
500.0000 mg/kg | Freq: Once | INTRAVENOUS | Status: AC
  Administered 2024-07-14: 35 g via INTRAVENOUS
  Filled 2024-07-14: qty 350

## 2024-07-14 MED ORDER — ACETAMINOPHEN 325 MG PO TABS
ORAL_TABLET | ORAL | Status: AC
  Filled 2024-07-14: qty 2

## 2024-07-14 MED ORDER — METHYLPREDNISOLONE SODIUM SUCC 40 MG IJ SOLR
10.0000 mg | Freq: Once | INTRAMUSCULAR | Status: AC
  Administered 2024-07-14: 10 mg via INTRAVENOUS

## 2024-07-14 MED ORDER — ACETAMINOPHEN 325 MG PO TABS
650.0000 mg | ORAL_TABLET | Freq: Once | ORAL | Status: AC
  Administered 2024-07-14: 650 mg via ORAL

## 2024-07-14 MED ORDER — DIPHENHYDRAMINE HCL 25 MG PO CAPS
25.0000 mg | ORAL_CAPSULE | Freq: Once | ORAL | Status: AC
  Administered 2024-07-14: 25 mg via ORAL

## 2024-07-14 MED ORDER — DIPHENHYDRAMINE HCL 25 MG PO CAPS
ORAL_CAPSULE | ORAL | Status: AC
Start: 2024-07-14 — End: 2024-07-14
  Filled 2024-07-14: qty 1

## 2024-07-14 MED ORDER — METHYLPREDNISOLONE SODIUM SUCC 40 MG IJ SOLR
INTRAMUSCULAR | Status: AC
  Filled 2024-07-14: qty 1

## 2024-07-14 MED FILL — Immune Globulin (Human) IV Soln 5 GM/50ML: INTRAVENOUS | Qty: 50 | Status: AC

## 2024-07-14 MED FILL — Immune Globulin (Human) IV Soln 10 GM/100ML: INTRAVENOUS | Qty: 300 | Status: AC

## 2024-07-15 ENCOUNTER — Ambulatory Visit
Admission: RE | Admit: 2024-07-15 | Discharge: 2024-07-15 | Disposition: A | Discharge status: Home / Self Care | Source: Ambulatory Visit | Attending: Rheumatology | Admitting: Rheumatology

## 2024-07-15 DIAGNOSIS — M3320 Polymyositis, organ involvement unspecified: Secondary | ICD-10-CM | POA: Diagnosis not present

## 2024-07-15 MED ORDER — ACETAMINOPHEN 325 MG PO TABS
ORAL_TABLET | ORAL | Status: AC
  Filled 2024-07-15: qty 2

## 2024-07-15 MED ORDER — METHYLPREDNISOLONE SODIUM SUCC 40 MG IJ SOLR
INTRAMUSCULAR | Status: AC
  Filled 2024-07-15: qty 1

## 2024-07-15 MED ORDER — DIPHENHYDRAMINE HCL 25 MG PO CAPS
25.0000 mg | ORAL_CAPSULE | Freq: Once | ORAL | Status: AC
  Administered 2024-07-15: 25 mg via ORAL

## 2024-07-15 MED ORDER — IMMUNE GLOBULIN (HUMAN) 10 GM/100ML IV SOLN
500.0000 mg/kg | Freq: Once | INTRAVENOUS | Status: AC
  Administered 2024-07-15: 40 g via INTRAVENOUS
  Filled 2024-07-15: qty 400

## 2024-07-15 MED ORDER — ACETAMINOPHEN 325 MG PO TABS
ORAL_TABLET | ORAL | Status: AC
  Filled 2024-07-15: qty 1

## 2024-07-15 MED ORDER — ACETAMINOPHEN 325 MG PO TABS
650.0000 mg | ORAL_TABLET | Freq: Once | ORAL | Status: AC
  Administered 2024-07-15: 650 mg via ORAL

## 2024-07-15 MED ORDER — METHYLPREDNISOLONE SODIUM SUCC 40 MG IJ SOLR
10.0000 mg | Freq: Once | INTRAMUSCULAR | Status: AC
  Administered 2024-07-15: 10 mg via INTRAVENOUS

## 2024-07-15 MED ORDER — DIPHENHYDRAMINE HCL 25 MG PO CAPS
ORAL_CAPSULE | ORAL | Status: AC
  Filled 2024-07-15: qty 1

## 2024-07-16 ENCOUNTER — Ambulatory Visit
Admission: RE | Admit: 2024-07-16 | Discharge: 2024-07-16 | Disposition: A | Discharge status: Home / Self Care | Source: Ambulatory Visit | Attending: Rheumatology | Admitting: Rheumatology

## 2024-07-16 DIAGNOSIS — M3322 Polymyositis with myopathy: Secondary | ICD-10-CM | POA: Diagnosis not present

## 2024-07-16 MED ORDER — ACETAMINOPHEN 325 MG PO TABS
ORAL_TABLET | ORAL | Status: AC
  Filled 2024-07-16: qty 2

## 2024-07-16 MED ORDER — DIPHENHYDRAMINE HCL 25 MG PO CAPS
25.0000 mg | ORAL_CAPSULE | Freq: Once | ORAL | Status: AC
  Administered 2024-07-16: 25 mg via ORAL

## 2024-07-16 MED ORDER — DIPHENHYDRAMINE HCL 25 MG PO CAPS
ORAL_CAPSULE | ORAL | Status: AC
  Filled 2024-07-16: qty 1

## 2024-07-16 MED ORDER — METHYLPREDNISOLONE SODIUM SUCC 40 MG IJ SOLR
10.0000 mg | Freq: Once | INTRAMUSCULAR | Status: AC
  Administered 2024-07-16: 10 mg via INTRAVENOUS

## 2024-07-16 MED ORDER — METHYLPREDNISOLONE SODIUM SUCC 40 MG IJ SOLR
INTRAMUSCULAR | Status: AC
  Filled 2024-07-16: qty 1

## 2024-07-16 MED ORDER — IMMUNE GLOBULIN (HUMAN) 10 GM/100ML IV SOLN
500.0000 mg/kg | Freq: Once | INTRAVENOUS | Status: AC
  Administered 2024-07-16: 40 g via INTRAVENOUS
  Filled 2024-07-16: qty 400

## 2024-07-16 MED ORDER — ACETAMINOPHEN 325 MG PO TABS
650.0000 mg | ORAL_TABLET | Freq: Once | ORAL | Status: AC
  Administered 2024-07-16: 650 mg via ORAL

## 2024-07-17 ENCOUNTER — Ambulatory Visit
Admission: RE | Admit: 2024-07-17 | Discharge: 2024-07-17 | Disposition: A | Discharge status: Home / Self Care | Source: Ambulatory Visit | Attending: Rheumatology | Admitting: Rheumatology

## 2024-07-17 DIAGNOSIS — M3320 Polymyositis, organ involvement unspecified: Secondary | ICD-10-CM | POA: Diagnosis not present

## 2024-07-17 MED ORDER — METHYLPREDNISOLONE SODIUM SUCC 40 MG IJ SOLR
INTRAMUSCULAR | Status: AC
Start: 2024-07-17 — End: 2024-07-17
  Filled 2024-07-17: qty 1

## 2024-07-17 MED ORDER — METHYLPREDNISOLONE SODIUM SUCC 40 MG IJ SOLR
10.0000 mg | Freq: Once | INTRAMUSCULAR | Status: AC
  Administered 2024-07-17: 10 mg via INTRAVENOUS

## 2024-07-17 MED ORDER — IMMUNE GLOBULIN (HUMAN) 20 GM/200ML IV SOLN
500.0000 mg/kg | Freq: Once | INTRAVENOUS | Status: AC
  Administered 2024-07-17: 40 g via INTRAVENOUS
  Filled 2024-07-17: qty 400

## 2024-07-17 MED ORDER — DIPHENHYDRAMINE HCL 25 MG PO CAPS
ORAL_CAPSULE | ORAL | Status: AC
Start: 2024-07-17 — End: 2024-07-17
  Filled 2024-07-17: qty 1

## 2024-07-17 MED ORDER — ACETAMINOPHEN 325 MG PO TABS
650.0000 mg | ORAL_TABLET | Freq: Once | ORAL | Status: AC
  Administered 2024-07-17: 650 mg via ORAL

## 2024-07-17 MED ORDER — DIPHENHYDRAMINE HCL 25 MG PO CAPS
25.0000 mg | ORAL_CAPSULE | Freq: Once | ORAL | Status: AC
  Administered 2024-07-17: 25 mg via ORAL

## 2024-07-17 MED ORDER — ACETAMINOPHEN 325 MG PO TABS
ORAL_TABLET | ORAL | Status: AC
  Filled 2024-07-17: qty 2

## 2024-07-17 NOTE — Progress Notes (Signed)
 IVIG infusion completed today. VS stable. No complaints . Patient was pre-scheduled for next  month infusion visit.

## 2024-07-21 ENCOUNTER — Encounter

## 2024-07-30 ENCOUNTER — Ambulatory Visit: Payer: Self-pay

## 2024-07-30 DIAGNOSIS — Z0100 Encounter for examination of eyes and vision without abnormal findings: Secondary | ICD-10-CM | POA: Diagnosis not present

## 2024-07-30 DIAGNOSIS — E119 Type 2 diabetes mellitus without complications: Secondary | ICD-10-CM | POA: Diagnosis not present

## 2024-07-30 DIAGNOSIS — H2513 Age-related nuclear cataract, bilateral: Secondary | ICD-10-CM | POA: Diagnosis not present

## 2024-07-30 LAB — HM DIABETES EYE EXAM

## 2024-07-30 NOTE — Telephone Encounter (Signed)
 First attempt to contact pt, no answer, phone kept ringing for 1.5 min, no option to LVM. Placed in call back.    Copied from CRM [id number]. Topic: Clinical - Medication Question >> Jul 30, 2024 10:04 AM Bridget Romero wrote: Reason for CRM: Patient would like to know if she can be prescribed a medication to help her sleep.  RA#[phone]

## 2024-07-30 NOTE — Telephone Encounter (Signed)
 FYI Only or Action Required?: FYI only for provider.  Patient was last seen in primary care on 07/01/2024 by Bridget Charlie FERNS, MD.  Called Nurse Triage reporting Insomnia.  Symptoms began about a month ago.  Interventions attempted: Nothing.  Symptoms are: stable.  Triage Disposition: See PCP Within 2 Weeks  Patient/caregiver understands and will follow disposition?: Yes    Reason for Disposition  [1] Insomnia lasts > 2 weeks AND [2] no improvement after using Care Advice  Answer Assessment - Initial Assessment Questions 1. DESCRIPTION: Tell me about your sleeping problem. (e.g., waking frequently during night, sleeping during day and awake at night, trouble falling asleep) How bad is it?      Going to sleep and waking up during the night, then finally fall back to sleep. Sometimes trouble going to sleep 2. ONSET: How long have you been having trouble sleeping? (e.g., days, weeks, months; longstanding sleep problems)     Every now and then for about a month 3. DAYTIME SLEEP PATTERN: How much time do you spend sleeping or napping during the day?     Nod off during day watching tv on some days 4. STRESSORS: Is there anything that is making you feel stressed? Is there something that worries you?     Sister passed away on 08-01-2024, nothing else causes stress or causes me to stay up at night 5. PAIN: Do you have any pain that is keeping you awake? (e.g., back pain, joint pain) If Yes, ask How bad is the pain? (e.g., scale 0-10; mild, moderate, severe).     Sometimes pain to lower abdomen and back, take Tylenol  and it relieves the pain somewhat. Sometimes it keeps me up 6. CAFFEINE: Do you drink caffeinated beverages? If Yes, ask How much each day? (e.g., coffee, tea, colas)     1 cup caffeine free tea in morning  7. MEDICINE CHANGE: Has there been any recent change in medicines? (e.g., new medicine started, stopped, or dose changed).      No 8. TREATMENT: What have  you done so far to treat this sleep problem? (e.g., prescription or OTC sleep medicines, herbal or dietary supplements, cannabis, relaxation strategies)     None 9. OTHER SYMPTOMS: Do you have any other symptoms?  (e.g., difficulty breathing)       No  Protocols used: Insomnia-A-AH

## 2024-07-31 ENCOUNTER — Ambulatory Visit: Admitting: Internal Medicine

## 2024-07-31 ENCOUNTER — Encounter: Payer: Self-pay | Admitting: Internal Medicine

## 2024-07-31 VITALS — BP 130/76 | HR 93 | Temp 98.6°F | Ht 66.0 in | Wt 182.0 lb

## 2024-07-31 DIAGNOSIS — F5101 Primary insomnia: Secondary | ICD-10-CM

## 2024-07-31 DIAGNOSIS — G4700 Insomnia, unspecified: Secondary | ICD-10-CM | POA: Insufficient documentation

## 2024-07-31 MED ORDER — TEMAZEPAM 15 MG PO CAPS: 15.0000 mg | ORAL_CAPSULE | Freq: Every evening | ORAL | 0 refills | Status: DC | PRN

## 2024-07-31 NOTE — Assessment & Plan Note (Signed)
 No new stressors or depression Usually sleeps 11-midnight until 7:30-8 She did well with temazepam  in the past--but we stopped it. Reminded her that it can be addictive Will Rx small amount for occasional use--not in middle of night

## 2024-07-31 NOTE — Progress Notes (Signed)
 Subjective:    Patient ID: Bridget Romero, female    DOB: January 13, 1946, 78 y.o.   MRN: [id number]  HPI Here due to sleep problems  Having sleep problems in the past couple of weeks Occasional trouble initiating--but mostly middle of night awakening. Variable about getting back to sleep Occasional better night  No new stressors---but does not things jump in my head Gets irritable when this happens  Some decaf tea and coffee Rare caffeine  Current Outpatient Medications on File Prior to Visit  Medication Sig Dispense Refill   Accu-Chek FastClix Lancets MISC USE TO TEST BLOOD SUGAR TWICE DAILY DX E11.40 102 each 11   ACCU-CHEK SMARTVIEW test strip USE AS DIRECTED TO CHECK BLOOD SUGAR TWICE DAILY 100 each 6   acetaminophen  (TYLENOL ) 500 MG tablet Take 500 mg by mouth every 6 (six) hours as needed.     Alcohol Swabs (B-D SINGLE USE SWABS REGULAR) PADS Use to test blood sugar once daily dx: 250.00 100 each 3   BD SYRINGE SLIP TIP 25G X 5/8 1 ML MISC      Blood Glucose Calibration (ACCU-CHEK AVIVA) SOLN Use as directed 1 each 3   Cholecalciferol (VITAMIN D ) 1000 UNITS capsule Take 1,000 Units by mouth daily.     ferrous sulfate 325 (65 FE) MG tablet Take 325 mg by mouth daily with breakfast.     folic acid  (FOLVITE ) 1 MG tablet Take 1 mg by mouth daily.     gabapentin (NEURONTIN) 300 MG capsule Take 1 capsule by mouth 3 (three) times daily.     Insulin  Syringe-Needle U-100 31G X 5/16 1 ML MISC by Does not apply route.     LANTUS  100 UNIT/ML injection INJECT TEN TO 20 UNITS INTO THE SKIN DAILY AS DIRECTED (VIAL IS GOOD FOR 28 DAYS AFTER FIRST USE) 10 mL 3   lisinopril  (ZESTRIL ) 10 MG tablet TAKE ONE TABLET BY MOUTH ONCE A DAY 90 tablet 3   metFORMIN  (GLUCOPHAGE ) 1000 MG tablet TAKE ONE TABLET BY MOUTH TWICE A DAY 180 tablet 3   methotrexate 250 MG/10ML injection Inject into the skin once a week.     Multiple Vitamin (MULTIVITAMIN) tablet Take 1 tablet by mouth daily.     nystatin   (MYCOSTATIN /NYSTOP ) powder Apply 1 Application topically 3 (three) times daily. (Patient taking differently: Apply 1 Application topically 3 (three) times daily as needed.) 60 g 2   predniSONE  (DELTASONE ) 2.5 MG tablet Take 2.5 mg by mouth daily.     tamsulosin  (FLOMAX ) 0.4 MG CAPS capsule Take 1 capsule (0.4 mg total) by mouth daily. 30 capsule 5   tiZANidine  (ZANAFLEX ) 2 MG tablet Take 1-2 tablets (2-4 mg total) by mouth at bedtime as needed for muscle spasms. 30 tablet 0   trospium  (SANCTURA ) 20 MG tablet Take 1 tab 1 hour prior to bedtime 30 tablet 11   TRUEPLUS INSULIN  SYRINGE 31G X 5/16 0.5 ML MISC USE ONE SYRINGE DAILY TO INJECT LANTUS  UNDER THE SKIN. 100 each 4   No current facility-administered medications on file prior to visit.    Allergies  Allergen Reactions   Atorvastatin  Other (See Comments)    myalgias   Pravastatin  Other (See Comments)    Muscle pain    Past Medical History:  Diagnosis Date   Allergy    Asthma    Diabetes mellitus    Hyperlipidemia    Hypertension    Neuromuscular disorder (HCC)    polymyositis   Osteoarthritis    Vitamin B12  deficiency     Past Surgical History:  Procedure Laterality Date   CESAREAN SECTION     COLONOSCOPY WITH PROPOFOL  N/A 03/07/2016   Procedure: COLONOSCOPY WITH PROPOFOL ;  Surgeon: Gladis RAYMOND Mariner, MD;  Location: [facility] ENDOSCOPY;  Service: Endoscopy;  Laterality: N/A;    Family History  Problem Relation Age of Onset   Heart disease Mother    Cancer Father    Breast cancer Neg Hx     Social History   Socioeconomic History   Marital status: Widowed    Spouse name: Not on file   Number of children: 2   Years of education: Not on file   Highest education level: Not on file  Occupational History   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 [employer]    Comment:    Tobacco Use   Smoking status: Former    Passive exposure: Never   Smokeless tobacco: Never   Tobacco comments:    age 66 when stopped  smoking  Vaping Use   Vaping status: Never Used  Substance and Sexual Activity   Alcohol use: No   Drug use: No   Sexual activity: Not on file  Other Topics Concern   Not on file  Social History Narrative   has living will   Son and daughter should make health care decisions for her   Would accept resuscitation   Not sure about tube feeds--but not excited about this   Social Drivers of Corporate investment banker Strain: Not on file  Food Insecurity: Not on file  Transportation Needs: Not on file  Physical Activity: Not on file  Stress: Not on file  Social Connections: Not on file  Intimate Partner Violence: Not on file   Review of Systems No illness or fever    Objective:   Physical Exam Constitutional:      Appearance: Normal appearance.  Neurological:     Mental Status: She is alert.  Psychiatric:        Mood and Affect: Mood normal.        Behavior: Behavior normal.            Assessment & Plan:

## 2024-07-31 NOTE — Telephone Encounter (Signed)
 Noted I can discuss options at today's visit

## 2024-08-11 ENCOUNTER — Other Ambulatory Visit: Payer: Self-pay | Admitting: Internal Medicine

## 2024-08-11 DIAGNOSIS — M51362 Other intervertebral disc degeneration, lumbar region with discogenic back pain and lower extremity pain: Secondary | ICD-10-CM | POA: Diagnosis not present

## 2024-08-11 DIAGNOSIS — M3322 Polymyositis with myopathy: Secondary | ICD-10-CM | POA: Diagnosis not present

## 2024-08-11 DIAGNOSIS — Z7960 Long term (current) use of unspecified immunomodulators and immunosuppressants: Secondary | ICD-10-CM | POA: Diagnosis not present

## 2024-08-22 ENCOUNTER — Telehealth: Payer: Self-pay

## 2024-08-22 NOTE — Telephone Encounter (Signed)
 Copied from CRM [id number]. Topic: General - Other >> Aug 22, 2024  8:35 AM Rea ORN wrote: Reason for CRM: pt has had a sore throat since Sunday. She is requesting an appt today. There was no available appt with PCP. Please advise if she can see someone else today.

## 2024-08-25 ENCOUNTER — Encounter

## 2024-08-26 ENCOUNTER — Ambulatory Visit
Admission: RE | Admit: 2024-08-26 | Discharge: 2024-08-26 | Disposition: A | Discharge status: Home / Self Care | Source: Ambulatory Visit | Attending: Rheumatology | Admitting: Rheumatology

## 2024-08-26 DIAGNOSIS — Z01818 Encounter for other preprocedural examination: Secondary | ICD-10-CM | POA: Diagnosis present

## 2024-08-26 DIAGNOSIS — M3322 Polymyositis with myopathy: Secondary | ICD-10-CM | POA: Diagnosis not present

## 2024-08-26 MED ORDER — ACETAMINOPHEN 325 MG PO TABS
650.0000 mg | ORAL_TABLET | Freq: Once | ORAL | Status: AC
  Administered 2024-08-26: 650 mg via ORAL

## 2024-08-26 MED ORDER — DIPHENHYDRAMINE HCL 25 MG PO CAPS
25.0000 mg | ORAL_CAPSULE | Freq: Once | ORAL | Status: AC
  Administered 2024-08-26: 25 mg via ORAL

## 2024-08-26 MED ORDER — METFORMIN HCL 500 MG PO TABS
1000.0000 mg | ORAL_TABLET | Freq: Once | ORAL | Status: AC
  Administered 2024-08-26: 500 mg via ORAL

## 2024-08-26 MED ORDER — IMMUNE GLOBULIN (HUMAN) 10 GM/100ML IV SOLN
500.0000 mg/kg | INTRAVENOUS | Status: DC
  Administered 2024-08-26: 35 g via INTRAVENOUS
  Filled 2024-08-26 (×2): qty 350

## 2024-08-26 MED ORDER — METHYLPREDNISOLONE SODIUM SUCC 40 MG IJ SOLR
INTRAMUSCULAR | Status: AC
  Filled 2024-08-26: qty 1

## 2024-08-26 MED ORDER — METFORMIN HCL 500 MG PO TABS
ORAL_TABLET | ORAL | Status: AC
  Filled 2024-08-26: qty 2

## 2024-08-26 MED ORDER — DIPHENHYDRAMINE HCL 25 MG PO CAPS
ORAL_CAPSULE | ORAL | Status: AC
  Filled 2024-08-26: qty 1

## 2024-08-26 MED ORDER — METHYLPREDNISOLONE SODIUM SUCC 40 MG IJ SOLR
10.0000 mg | Freq: Once | INTRAMUSCULAR | Status: AC
  Administered 2024-08-26: 10 mg via INTRAVENOUS

## 2024-08-26 MED ORDER — IMMUNE GLOBULIN (HUMAN) 10 GM/100ML IV SOLN: 1.0000 g/kg | INTRAVENOUS | Status: DC

## 2024-08-26 MED ORDER — ACETAMINOPHEN 325 MG PO TABS
ORAL_TABLET | ORAL | Status: AC
  Filled 2024-08-26: qty 2

## 2024-08-27 ENCOUNTER — Ambulatory Visit
Admission: RE | Admit: 2024-08-27 | Discharge: 2024-08-27 | Disposition: A | Discharge status: Home / Self Care | Source: Ambulatory Visit | Attending: Rheumatology | Admitting: Rheumatology

## 2024-08-27 DIAGNOSIS — M3322 Polymyositis with myopathy: Secondary | ICD-10-CM | POA: Insufficient documentation

## 2024-08-27 DIAGNOSIS — M3320 Polymyositis, organ involvement unspecified: Secondary | ICD-10-CM | POA: Diagnosis present

## 2024-08-27 MED ORDER — METHYLPREDNISOLONE SODIUM SUCC 125 MG IJ SOLR
INTRAMUSCULAR | Status: AC
  Filled 2024-08-27: qty 2

## 2024-08-27 MED ORDER — ACETAMINOPHEN 325 MG PO TABS
ORAL_TABLET | ORAL | Status: AC
  Filled 2024-08-27: qty 2

## 2024-08-27 MED ORDER — METHYLPREDNISOLONE SODIUM SUCC 40 MG IJ SOLR
INTRAMUSCULAR | Status: AC
Start: 2024-08-27 — End: 2024-08-27
  Filled 2024-08-27: qty 1

## 2024-08-27 MED ORDER — DIPHENHYDRAMINE HCL 25 MG PO CAPS
25.0000 mg | ORAL_CAPSULE | Freq: Once | ORAL | Status: AC
  Administered 2024-08-27: 25 mg via ORAL

## 2024-08-27 MED ORDER — IMMUNE GLOBULIN (HUMAN) 10 GM/100ML IV SOLN
500.0000 mg/kg | INTRAVENOUS | Status: AC
  Administered 2024-08-27: 35 g via INTRAVENOUS
  Filled 2024-08-27: qty 350

## 2024-08-27 MED ORDER — DIPHENHYDRAMINE HCL 25 MG PO CAPS
ORAL_CAPSULE | ORAL | Status: AC
  Filled 2024-08-27: qty 1

## 2024-08-27 MED ORDER — ACETAMINOPHEN 325 MG PO TABS
650.0000 mg | ORAL_TABLET | Freq: Once | ORAL | Status: AC
  Administered 2024-08-27: 650 mg via ORAL

## 2024-08-27 MED ORDER — METHYLPREDNISOLONE SODIUM SUCC 40 MG IJ SOLR
10.0000 mg | Freq: Once | INTRAMUSCULAR | Status: AC
  Administered 2024-08-27: 10 mg via INTRAVENOUS

## 2024-08-27 MED FILL — Immune Globulin (Human) IV Soln 10 GM/100ML: INTRAVENOUS | Qty: 100 | Status: AC

## 2024-08-27 MED FILL — Immune Globulin (Human) IV Soln 5 GM/50ML: INTRAVENOUS | Qty: 50 | Status: AC

## 2024-08-27 MED FILL — Immune Globulin (Human) IV Soln 20 GM/200ML: INTRAVENOUS | Qty: 200 | Status: AC

## 2024-08-28 ENCOUNTER — Ambulatory Visit
Admission: RE | Admit: 2024-08-28 | Discharge: 2024-08-28 | Disposition: A | Discharge status: Home / Self Care | Source: Ambulatory Visit | Attending: Rheumatology | Admitting: Rheumatology

## 2024-08-28 DIAGNOSIS — M3320 Polymyositis, organ involvement unspecified: Secondary | ICD-10-CM | POA: Diagnosis present

## 2024-08-28 MED ORDER — IMMUNE GLOBULIN (HUMAN) 5 GM/50ML IV SOLN
35.0000 g | Freq: Once | INTRAVENOUS | Status: AC
  Administered 2024-08-28: 35 g via INTRAVENOUS
  Filled 2024-08-28: qty 100

## 2024-08-28 MED ORDER — ACETAMINOPHEN 325 MG PO TABS
ORAL_TABLET | ORAL | Status: AC
  Filled 2024-08-28: qty 2

## 2024-08-28 MED ORDER — DIPHENHYDRAMINE HCL 25 MG PO CAPS
ORAL_CAPSULE | ORAL | Status: AC
Start: 2024-08-28 — End: 2024-08-28
  Filled 2024-08-28: qty 1

## 2024-08-28 MED ORDER — DIPHENHYDRAMINE HCL 25 MG PO CAPS
25.0000 mg | ORAL_CAPSULE | Freq: Once | ORAL | Status: AC
  Administered 2024-08-28: 25 mg via ORAL

## 2024-08-28 MED ORDER — METHYLPREDNISOLONE SODIUM SUCC 40 MG IJ SOLR
10.0000 mg | Freq: Once | INTRAMUSCULAR | Status: AC
  Administered 2024-08-28: 10 mg via INTRAVENOUS

## 2024-08-28 MED ORDER — METHYLPREDNISOLONE SODIUM SUCC 40 MG IJ SOLR
INTRAMUSCULAR | Status: AC
Start: 2024-08-28 — End: 2024-08-28
  Filled 2024-08-28: qty 1

## 2024-08-28 MED ORDER — ACETAMINOPHEN 325 MG PO TABS
650.0000 mg | ORAL_TABLET | Freq: Once | ORAL | Status: AC
  Administered 2024-08-28: 650 mg via ORAL

## 2024-08-29 ENCOUNTER — Ambulatory Visit
Admission: RE | Admit: 2024-08-29 | Discharge: 2024-08-29 | Disposition: A | Discharge status: Home / Self Care | Source: Ambulatory Visit | Attending: Rheumatology | Admitting: Rheumatology

## 2024-08-29 DIAGNOSIS — M3320 Polymyositis, organ involvement unspecified: Secondary | ICD-10-CM | POA: Insufficient documentation

## 2024-08-29 MED ORDER — DIPHENHYDRAMINE HCL 25 MG PO CAPS
ORAL_CAPSULE | ORAL | Status: AC
  Filled 2024-08-29: qty 1

## 2024-08-29 MED ORDER — METHYLPREDNISOLONE SODIUM SUCC 40 MG IJ SOLR
10.0000 mg | Freq: Once | INTRAMUSCULAR | Status: AC
  Administered 2024-08-29: 10 mg via INTRAVENOUS

## 2024-08-29 MED ORDER — ACETAMINOPHEN 325 MG PO TABS
ORAL_TABLET | ORAL | Status: AC
  Filled 2024-08-29: qty 2

## 2024-08-29 MED ORDER — IMMUNE GLOBULIN (HUMAN) 10 GM/100ML IV SOLN
500.0000 mg/kg | Freq: Once | INTRAVENOUS | Status: AC
  Administered 2024-08-29: 40 g via INTRAVENOUS
  Filled 2024-08-29: qty 400

## 2024-08-29 MED ORDER — METHYLPREDNISOLONE SODIUM SUCC 40 MG IJ SOLR
INTRAMUSCULAR | Status: AC
Start: 2024-08-29 — End: 2024-08-29
  Filled 2024-08-29: qty 1

## 2024-08-29 MED ORDER — ACETAMINOPHEN 325 MG PO TABS
650.0000 mg | ORAL_TABLET | Freq: Once | ORAL | Status: AC
  Administered 2024-08-29: 650 mg via ORAL

## 2024-08-29 MED ORDER — DIPHENHYDRAMINE HCL 25 MG PO CAPS
25.0000 mg | ORAL_CAPSULE | Freq: Once | ORAL | Status: AC
  Administered 2024-08-29: 25 mg via ORAL

## 2024-09-03 ENCOUNTER — Ambulatory Visit: Admitting: Primary Care

## 2024-09-03 ENCOUNTER — Encounter: Payer: Self-pay | Admitting: Primary Care

## 2024-09-03 VITALS — BP 126/68 | HR 93 | Temp 97.7°F | Ht 66.0 in | Wt 179.0 lb

## 2024-09-03 DIAGNOSIS — Z23 Encounter for immunization: Secondary | ICD-10-CM | POA: Diagnosis not present

## 2024-09-03 DIAGNOSIS — J019 Acute sinusitis, unspecified: Secondary | ICD-10-CM | POA: Diagnosis not present

## 2024-09-03 MED ORDER — AMOXICILLIN-POT CLAVULANATE 875-125 MG PO TABS: 1.0000 | ORAL_TABLET | Freq: Two times a day (BID) | ORAL | 0 refills | Status: DC

## 2024-09-03 NOTE — Patient Instructions (Signed)
 Start Augmentin  antibiotics. Take 1 tablet by mouth twice daily for 7 days.  Continue saline nasal spray and Claritin.  It was a pleasure meeting you!

## 2024-09-03 NOTE — Progress Notes (Signed)
 Subjective:    Patient ID: Bridget Romero, female    DOB: [birth year], 78 y.o.   MRN: [id number]  Bridget Romero is a very pleasant 78 y.o. female patient of Dr. Jimmy with a history of hypertension, type 2 diabetes, overactive bladder, polymyositis with myopathy who presents today to discuss nasal congestion.   Symptom onset about 2 weeks ago with runny nose, feeling fatigue, mild cough, sinus pressure. She then began experiencing diarrhea which subsided yesterday. She has a mild productive cough with whitish/green. Overall she's feeling about the same, no significant improvement.   She's been taking Claritin, saline nasal spray, Imodium. She denies fevers, chills, shortness of breath, chest tightness.    Review of Systems  Constitutional:  Negative for chills and fever.  HENT:  Positive for congestion and sinus pressure.   Respiratory:  Positive for cough. Negative for chest tightness and shortness of breath.          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 [employer]    Comment:    Tobacco Use   Smoking status: Former    Passive exposure: Never   Smokeless tobacco: Never   Tobacco comments:    age 49 when stopped smoking  Vaping Use   Vaping status: Never Used  Substance and Sexual Activity   Alcohol use: No   Drug use: No   Sexual activity: Not on file  Other Topics Concern   Not on file  Social History Narrative   has living will   Son and daughter should make health care decisions for her   Would accept  resuscitation   Not sure about tube feeds--but not excited about this   Social Drivers of Corporate investment banker Strain: Not on file  Food Insecurity: Not on file  Transportation Needs: Not on file  Physical Activity: Not on file  Stress: Not on file  Social Connections: Not on file  Intimate Partner Violence: Not on file    Past Surgical History:  Procedure Laterality Date   CESAREAN SECTION     COLONOSCOPY WITH PROPOFOL  N/A 03/07/2016   Procedure: COLONOSCOPY WITH PROPOFOL ;  Surgeon: Gladis RAYMOND Mariner, MD;  Location: [facility] ENDOSCOPY;  Service: Endoscopy;  Laterality: N/A;    Family History  Problem Relation Age of Onset   Heart disease Mother    Cancer Father    Breast cancer Neg Hx     Allergies  Allergen Reactions   Atorvastatin  Other (See Comments)    myalgias   Pravastatin  Other (See Comments)    Muscle pain    Current Outpatient Medications on File Prior to Visit  Medication Sig Dispense Refill   Accu-Chek FastClix Lancets MISC USE TO TEST BLOOD SUGAR TWICE DAILY DX E11.40 102 each 11   ACCU-CHEK SMARTVIEW test strip USE AS DIRECTED TO CHECK BLOOD SUGAR TWICE DAILY 100 each 6   acetaminophen  (TYLENOL ) 500 MG tablet Take 500 mg by mouth every  6 (six) hours as needed.     Alcohol Swabs (B-D SINGLE USE SWABS REGULAR) PADS Use to test blood sugar once daily dx: 250.00 100 each 3   BD INSULIN  SYRINGE ULTRAFINE 31G X 5/16 0.5 ML MISC USE ONE SYRINGE DAILY TO INJECT LANTUS  UNDER THE SKIN. 100 each 4   BD SYRINGE SLIP TIP 25G X 5/8 1 ML MISC      Blood Glucose Calibration (ACCU-CHEK AVIVA) SOLN Use as directed 1 each 3   Cholecalciferol (VITAMIN D ) 1000 UNITS capsule Take 1,000 Units by mouth daily.     ferrous sulfate 325 (65 FE) MG tablet Take 325 mg by mouth daily with breakfast.     folic acid  (FOLVITE ) 1 MG tablet Take 1 mg by mouth daily.     gabapentin (NEURONTIN) 300 MG capsule Take 1 capsule by mouth 3 (three) times daily.     Insulin  Syringe-Needle  U-100 31G X 5/16 1 ML MISC by Does not apply route.     LANTUS  100 UNIT/ML injection INJECT TEN TO 20 UNITS INTO THE SKIN DAILY AS DIRECTED (VIAL IS GOOD FOR 28 DAYS AFTER FIRST USE) 10 mL 3   lisinopril  (ZESTRIL ) 10 MG tablet TAKE ONE TABLET BY MOUTH ONCE A DAY 90 tablet 3   metFORMIN  (GLUCOPHAGE ) 1000 MG tablet TAKE ONE TABLET BY MOUTH TWICE A DAY 180 tablet 3   methotrexate 250 MG/10ML injection Inject into the skin once a week.     Multiple Vitamin (MULTIVITAMIN) tablet Take 1 tablet by mouth daily.     predniSONE  (DELTASONE ) 2.5 MG tablet Take 2.5 mg by mouth daily.     tamsulosin  (FLOMAX ) 0.4 MG CAPS capsule Take 1 capsule (0.4 mg total) by mouth daily. 30 capsule 5   temazepam  (RESTORIL ) 15 MG capsule Take 1 capsule (15 mg total) by mouth at bedtime as needed for sleep. 30 capsule 0   tiZANidine  (ZANAFLEX ) 2 MG tablet Take 1-2 tablets (2-4 mg total) by mouth at bedtime as needed for muscle spasms. 30 tablet 0   trospium  (SANCTURA ) 20 MG tablet Take 1 tab 1 hour prior to bedtime 30 tablet 11   nystatin  (MYCOSTATIN /NYSTOP ) powder Apply 1 Application topically 3 (three) times daily. (Patient taking differently: Apply 1 Application topically 3 (three) times daily as needed.) 60 g 2   No current facility-administered medications on file prior to visit.    BP 126/68   Pulse 93   Temp 97.7 F (36.5 C) (Temporal)   Ht 5' 6 (1.676 m)   Wt 179 lb (81.2 kg)   SpO2 100%   BMI 28.89 kg/m  Objective:   Physical Exam Cardiovascular:     Rate and Rhythm: Normal rate and regular rhythm.  Pulmonary:     Effort: Pulmonary effort is normal.     Breath sounds: Examination of the right-upper field reveals rhonchi. Examination of the right-lower field reveals rhonchi. Rhonchi present.  Musculoskeletal:     Cervical back: Neck supple.  Skin:    General: Skin is warm and dry.  Neurological:     Mental Status: She is alert and oriented to person, place, and time.  Psychiatric:        Mood and  Affect: Mood normal.     Physical Exam        Assessment & Plan:  Acute non-recurrent sinusitis, unspecified location Assessment & Plan: Differentials include sinusitis, URI, allergies.   Given duration of symptoms, coupled with medical history and lack of significant improvement, will treat  for presumed bacterial infection.  Start Augmentin  antibiotics. Take 1 tablet by mouth twice daily for 7 days. Continue nasal spray and Claritin.  Follow up PRN  Orders: -     Amoxicillin -Pot Clavulanate; Take 1 tablet by mouth 2 (two) times daily.  Dispense: 14 tablet; Refill: 0  Encounter for immunization    Assessment and Plan Assessment & Plan         Comer MARLA Gaskins, NP    History of Present Illness

## 2024-09-03 NOTE — Assessment & Plan Note (Signed)
 Differentials include sinusitis, URI, allergies.   Given duration of symptoms, coupled with medical history and lack of significant improvement, will treat for presumed bacterial infection.  Start Augmentin  antibiotics. Take 1 tablet by mouth twice daily for 7 days. Continue nasal spray and Claritin.  Follow up PRN

## 2024-09-09 ENCOUNTER — Ambulatory Visit: Payer: Self-pay

## 2024-09-09 ENCOUNTER — Telehealth: Payer: Self-pay

## 2024-09-09 NOTE — Telephone Encounter (Signed)
 This RN made first attempt to reach patient on both home and mobile number.   Copied from CRM [id number]. Topic: Clinical - Pink Word Triage >> Sep 09, 2024  8:01 AM Turkey A wrote: Reason for Triage: Patient said that she is wondering if she is gong through withdraw symptoms from not drinking Coffee. Patient said that her eyes feel tight and that she is having Sinus Pressure.

## 2024-09-09 NOTE — Telephone Encounter (Signed)
 Fyi, pt has OV tomorrow at 9:00 with Mallie.

## 2024-09-09 NOTE — Telephone Encounter (Signed)
 Copied from CRM #[id number]. Topic: Clinical - Medical Advice >> Sep 08, 2024  4:15 PM DeAngela L wrote: Reason for CRM: patient calling after getting a prescription last week for a sinus pressure and her eyes are tight and the meds not working and the patient is calling to ask if the provider could callthe pharmacy for her since the meds not working   Pt num [phone]    [facility] - [place], [place] - [address] [place] [address] Phone: [phone] Fax: [phone]

## 2024-09-09 NOTE — Telephone Encounter (Signed)
 RN attempted contact x 2 to discuss symptoms/ concerns; no answer. Phone only rings, Will attempt contact at a later time.

## 2024-09-09 NOTE — Telephone Encounter (Signed)
 RN attempted contact x 1 to discuss symptoms; no answer. Will attempt contact at a later time.            Message from Turkey A sent at 09/09/2024  8:03 AM EDT  Patient said that she is wondering if she is gong through withdraw symptoms from not drinking Coffee. Patient said that her eyes feel tight and that she is having Sinus Pressure.

## 2024-09-09 NOTE — Telephone Encounter (Signed)
 FYI Only or Action Required?: FYI only for provider.  Patient was last seen in primary care on 09/03/2024 by Gretta Comer POUR, NP.  Called Nurse Triage reporting Sinusitis.  Symptoms began a week ago.  Interventions attempted: Prescription medications: Augmentin .  Symptoms are: gradually improving.  Triage Disposition: See Physician Within 24 Hours                             Patient/caregiver understands and will follow disposition?: Yes Reason for Disposition  [1] Taking antibiotic > 72 hours (3 days) AND [2] sinus pain not improved  Answer Assessment - Initial Assessment Questions 1. LOCATION: Where does it hurt?      Tightness along eyes and headache 2. ONSET: When did the sinus pain start?  (e.g., hours, days)      Last week, seen for OV on 09/03/24 3. SEVERITY: How bad is the pain?   (Scale 0-10; or none, mild, moderate or severe)     Rates pain about a 6 5. NASAL CONGESTION: Is the nose blocked? If Yes, ask: Can you open it or must you breathe through your mouth?     Denies difficulty breathing 6. NASAL DISCHARGE: Do you have discharge from your nose? If so ask, What color?     Denies 7. FEVER: Do you have a fever? If Yes, ask: What is it, how was it measured, and when did it start?      Denies 8. OTHER SYMPTOMS: Do you have any other symptoms? (e.g., sore throat, cough, earache, difficulty breathing)     Denies sore throat, denies cough   Patient stated she was treated for a sinus infection last week, but also started removing coffee from her diet around the same time. Patient stated she is still experiencing symptoms, but they have improved. Patient is unsure if sinus symptoms are related to sinus infection or caffeine withdrawal. Scheduled patient in office for tomorrow morning.  Answer Assessment - Initial Assessment Questions 1. ANTIBIOTIC: What antibiotic are you taking? How many times a day?     Augmentin  2.  ONSET: When was the antibiotic started?     09/03/24 3. PAIN: How bad is the pain?   (Scale 0-10; or none, mild, moderate or severe)     Rates pain a 6 4. FEVER: Do you have a fever? If Yes, ask: What is it, how was it measured, and when did it start?      Denies 5. SYMPTOMS: Are there any other symptoms you're concerned about? If Yes, ask: When did it start?     Denies, states other symptoms have improved, but sinus pain is consistent  Protocols used: Sinus Pain or Congestion-A-AH, Sinus Infection on Antibiotic Follow-up Call-A-AH

## 2024-09-10 ENCOUNTER — Ambulatory Visit
Admission: RE | Admit: 2024-09-10 | Discharge: 2024-09-10 | Disposition: A | Discharge status: Home / Self Care | Source: Ambulatory Visit | Attending: Primary Care | Admitting: Primary Care

## 2024-09-10 ENCOUNTER — Ambulatory Visit: Admitting: Primary Care

## 2024-09-10 ENCOUNTER — Encounter: Payer: Self-pay | Admitting: Primary Care

## 2024-09-10 VITALS — BP 136/82 | HR 66 | Temp 97.6°F | Ht 66.0 in | Wt 181.0 lb

## 2024-09-10 DIAGNOSIS — R519 Headache, unspecified: Secondary | ICD-10-CM | POA: Insufficient documentation

## 2024-09-10 DIAGNOSIS — D649 Anemia, unspecified: Secondary | ICD-10-CM

## 2024-09-10 DIAGNOSIS — R5383 Other fatigue: Secondary | ICD-10-CM | POA: Diagnosis not present

## 2024-09-10 LAB — IBC + FERRITIN
Ferritin: 121.7 ng/mL (ref 10.0–291.0)
Iron: 78 ug/dL (ref 42–145)
Saturation Ratios: 31.8 % (ref 20.0–50.0)
TIBC: 245 ug/dL — ABNORMAL LOW (ref 250.0–450.0)
Transferrin: 175 mg/dL — ABNORMAL LOW (ref 212.0–360.0)

## 2024-09-10 LAB — CBC
HCT: 29.9 % — ABNORMAL LOW (ref 36.0–46.0)
Hemoglobin: 9.6 g/dL — ABNORMAL LOW (ref 12.0–15.0)
MCHC: 32.3 g/dL (ref 30.0–36.0)
MCV: 91.4 fl (ref 78.0–100.0)
Platelets: 254 K/uL (ref 150.0–400.0)
RBC: 3.27 Mil/uL — ABNORMAL LOW (ref 3.87–5.11)
RDW: 14.1 % (ref 11.5–15.5)
WBC: 6.3 K/uL (ref 4.0–10.5)

## 2024-09-10 NOTE — Telephone Encounter (Signed)
 Noted, will evaluate.

## 2024-09-10 NOTE — Progress Notes (Signed)
 Subjective:    Patient ID: Bridget Romero, female    DOB: 02-25-1946, 78 y.o.   MRN: [id number]  Bridget Romero is a very pleasant 78 y.o. female patient of Dr. Jimmy with a history of acute sinusitis, iron  deficiency, asthma, allergic rhinitis who presents today to discuss headache.  She was last evaluated by me on 09/03/24 for a 2-week history of rhinorrhea, fatigue, cough, sinus pressure.  She was diagnosed with acute sinusitis and prescribed Augmentin  twice daily x 7 days.  Her Claritin and Flonase  were continued.  Since her last visit she completed her Augmentin  antibiotic yesterday. Her symptoms of sinus pressure, rhinorrhea, and post nasal drip have resolved.   Her main concern today is an intermittent headache located to the mid frontal lobe that began a few weeks ago. Symptoms have occurred three times weekly on average.  She questions if her symptoms are secondary to a caffeine withdrawal as she stopped caffeine one week ago. She has no headache/migraine history of diagnosis. She denies photophobia, blurred vision, nausea, history of migraines. She's taken Tylenol  which helps. She stopped taking Claritin and Flonase  yesterday. Her last headache was yesterday, no headache today.   She's also noticed fatigue which is chronic for her, no worse than usual.  Follows with hematology for iron  deficiency anemia. Last visit was in May 2025. She denies vaginal bleeding and rectal bleeding.     BP Readings from Last 3 Encounters:  09/10/24 136/82  09/03/24 126/68  07/31/24 130/76     Review of Systems  Constitutional:  Positive for fatigue. Negative for chills and fever.  HENT:  Negative for ear pain, postnasal drip and sinus pressure.   Eyes:  Negative for visual disturbance.  Respiratory:  Negative for cough.   Gastrointestinal:  Negative for blood in stool.  Genitourinary:  Negative for vaginal bleeding.  Neurological:  Positive for headaches. Negative for dizziness.          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 [employer]    Comment:    Tobacco Use   Smoking status: Former    Passive exposure: Never   Smokeless tobacco: Never   Tobacco comments:    age 75 when stopped smoking  Vaping Use   Vaping status: Never Used  Substance and Sexual Activity   Alcohol use: No   Drug use: No   Sexual activity: Not on file  Other Topics Concern   Not on file  Social History Narrative   has living will   Son and daughter should make health care decisions for her   Would accept resuscitation   Not sure about tube feeds--but not excited about this   Social Drivers of Corporate investment banker Strain: Not on file  Food Insecurity: Not on file  Transportation Needs: Not on file  Physical Activity: Not on file  Stress: Not on file  Social Connections: Not on file  Intimate Partner Violence:  Not on file    Past Surgical History:  Procedure Laterality Date   CESAREAN SECTION     COLONOSCOPY WITH PROPOFOL  N/A 03/07/2016   Procedure: COLONOSCOPY WITH PROPOFOL ;  Surgeon: Gladis RAYMOND Mariner, MD;  Location: [facility] ENDOSCOPY;  Service: Endoscopy;  Laterality: N/A;    Family History  Problem Relation Age of Onset   Heart disease Mother    Cancer Father    Breast cancer Neg Hx     Allergies  Allergen Reactions   Atorvastatin  Other (See Comments)    myalgias   Pravastatin  Other (See Comments)    Muscle pain    Current Outpatient Medications on File Prior to Visit  Medication Sig Dispense Refill    Accu-Chek FastClix Lancets MISC USE TO TEST BLOOD SUGAR TWICE DAILY DX E11.40 102 each 11   ACCU-CHEK SMARTVIEW test strip USE AS DIRECTED TO CHECK BLOOD SUGAR TWICE DAILY 100 each 6   acetaminophen  (TYLENOL ) 500 MG tablet Take 500 mg by mouth every 6 (six) hours as needed.     Alcohol Swabs (B-D SINGLE USE SWABS REGULAR) PADS Use to test blood sugar once daily dx: 250.00 100 each 3   BD INSULIN  SYRINGE ULTRAFINE 31G X 5/16 0.5 ML MISC USE ONE SYRINGE DAILY TO INJECT LANTUS  UNDER THE SKIN. 100 each 4   BD SYRINGE SLIP TIP 25G X 5/8 1 ML MISC      Blood Glucose Calibration (ACCU-CHEK AVIVA) SOLN Use as directed 1 each 3   Cholecalciferol (VITAMIN D ) 1000 UNITS capsule Take 1,000 Units by mouth daily.     ferrous sulfate 325 (65 FE) MG tablet Take 325 mg by mouth daily with breakfast.     folic acid  (FOLVITE ) 1 MG tablet Take 1 mg by mouth daily.     gabapentin (NEURONTIN) 300 MG capsule Take 1 capsule by mouth 3 (three) times daily.     Insulin  Syringe-Needle U-100 31G X 5/16 1 ML MISC by Does not apply route.     LANTUS  100 UNIT/ML injection INJECT TEN TO 20 UNITS INTO THE SKIN DAILY AS DIRECTED (VIAL IS GOOD FOR 28 DAYS AFTER FIRST USE) 10 mL 3   lisinopril  (ZESTRIL ) 10 MG tablet TAKE ONE TABLET BY MOUTH ONCE A DAY 90 tablet 3   metFORMIN  (GLUCOPHAGE ) 1000 MG tablet TAKE ONE TABLET BY MOUTH TWICE A DAY 180 tablet 3   methotrexate 250 MG/10ML injection Inject into the skin once a week.     Multiple Vitamin (MULTIVITAMIN) tablet Take 1 tablet by mouth daily.     nystatin  (MYCOSTATIN /NYSTOP ) powder Apply 1 Application topically 3 (three) times daily. (Patient taking differently: Apply 1 Application topically 3 (three) times daily as needed.) 60 g 2   predniSONE  (DELTASONE ) 2.5 MG tablet Take 2.5 mg by mouth daily.     tamsulosin  (FLOMAX ) 0.4 MG CAPS capsule Take 1 capsule (0.4 mg total) by mouth daily. 30 capsule 5   temazepam  (RESTORIL ) 15 MG capsule Take 1 capsule (15 mg total) by mouth at  bedtime as needed for sleep. 30 capsule 0   tiZANidine  (ZANAFLEX ) 2 MG tablet Take 1-2 tablets (2-4 mg total) by mouth at bedtime as needed for muscle spasms. 30 tablet 0   trospium  (SANCTURA ) 20 MG tablet Take 1 tab 1 hour prior to bedtime 30 tablet 11   No current facility-administered medications on file prior to visit.    BP 136/82   Pulse 66   Temp 97.6 F (36.4 C) (Temporal)   Ht 5' 6 (  1.676 m)   Wt 181 lb (82.1 kg)   SpO2 98%   BMI 29.21 kg/m  Objective:   Physical Exam Eyes:     Extraocular Movements: Extraocular movements intact.  Cardiovascular:     Rate and Rhythm: Normal rate and regular rhythm.  Pulmonary:     Effort: Pulmonary effort is normal.     Breath sounds: Normal breath sounds.  Musculoskeletal:     Cervical back: Neck supple.  Skin:    General: Skin is warm and dry.  Neurological:     Mental Status: She is alert and oriented to person, place, and time.     Cranial Nerves: No cranial nerve deficit.  Psychiatric:        Mood and Affect: Mood normal.     Physical Exam        Assessment & Plan:  New onset of headaches Assessment & Plan: Newer onset of headaches is concerning given age.   Will proceed with MRI brain to rule out mass/other abnormality.  Will have her try Tylenol  500 mg at onset of new headache, take 500 mg of Tylenol  6-8 hours later for prevention.  Await results.  No alarm signs on exam.  I evaluated patient, was consulted regarding treatment, and agree with assessment and plan per Allean Simpler, MSN, FNP student.   Mallie Gaskins, NP-C   Orders: -     MR BRAIN WO CONTRAST; Future  Symptomatic anemia -     CBC -     IBC + Ferritin  Other fatigue Assessment & Plan: Chronic and unchanged.   Given history of anemia, will check labs today including CBC and Ferritin.  Reviewed labs from hematology from May 2025  I evaluated patient, was consulted regarding treatment, and agree with assessment and plan per Allean Simpler,  MSN, FNP student.   Mallie Gaskins, NP-C      Assessment and Plan Assessment & Plan         Comer MARLA Gaskins, NP   History of Present Illness

## 2024-09-10 NOTE — Telephone Encounter (Signed)
Will evaluate.

## 2024-09-10 NOTE — Progress Notes (Signed)
   Acute Office Visit  Subjective:     Patient ID: Bridget Romero, female    DOB: 13-Feb-1946, 78 y.o.   MRN: [id number]  No chief complaint on file.   Headache  Pertinent negatives include no blurred vision or photophobia.   [name] is a 78 year old female with a history of allergic rhinitis and acute sinusitis that was seen 1 week ago for sinusitis and prescribed Augmentin  x 1 week. She states that all symptoms have subsided, but has a new onset of headache and fatigue. Headache is mid frontal lobe that occurs 3 times per week and describes it as a pressure. She states that she has taken Tylenol  with relief. Denies any photophobia, double vision, or blurred vision.    Review of Systems  Constitutional: Negative.   HENT: Negative.    Eyes: Negative.  Negative for blurred vision, double vision and photophobia.  Respiratory: Negative.    Cardiovascular: Negative.   Genitourinary: Negative.   Musculoskeletal: Negative.   Skin: Negative.   Neurological:  Positive for headaches.        Objective:    There were no vitals taken for this visit.   Physical Exam HENT:     Right Ear: Tympanic membrane normal.     Left Ear: Tympanic membrane normal.     Nose: Nose normal.  Eyes:     Extraocular Movements: Extraocular movements intact.     Pupils: Pupils are equal, round, and reactive to light.  Cardiovascular:     Rate and Rhythm: Normal rate and regular rhythm.  Pulmonary:     Effort: Pulmonary effort is normal.     Breath sounds: Normal breath sounds.  Skin:    General: Skin is warm and dry.     Capillary Refill: Capillary refill takes less than 2 seconds.  Neurological:     Mental Status: She is alert and oriented to person, place, and time.     No results found for any visits on 09/10/24.      Assessment & Plan:   Problem List Items Addressed This Visit   None   No orders of the defined types were placed in this encounter.   No follow-ups on  file.  Bell Cai, RN

## 2024-09-10 NOTE — Assessment & Plan Note (Addendum)
 Chronic and unchanged.   Given history of anemia, will check labs today including CBC and Ferritin.  Reviewed labs from hematology from May 2025  I evaluated patient, was consulted regarding treatment, and agree with assessment and plan per Allean Simpler, MSN, FNP student.   Mallie Gaskins, NP-C

## 2024-09-10 NOTE — Assessment & Plan Note (Signed)
 Symptoms relieved.  Continue taking Flonase  and Claritin daily.   Follow up as needed.

## 2024-09-10 NOTE — Assessment & Plan Note (Addendum)
 Newer onset of headaches is concerning given age.   Will proceed with MRI brain to rule out mass/other abnormality.  Will have her try Tylenol  500 mg at onset of new headache, take 500 mg of Tylenol  6-8 hours later for prevention.  Await results.  No alarm signs on exam.  I evaluated patient, was consulted regarding treatment, and agree with assessment and plan per Allean Simpler, MSN, FNP student.   Mallie Gaskins, NP-C

## 2024-09-10 NOTE — Patient Instructions (Signed)
 MRI scheduled, pending results.  Stop by the lab prior to leaving today. I will notify you of your results once received.    Continued taking Tylenol  500 mg when new onset of headache, take another 500 mg Tylenol  6-8 hours later for preventative treatment. Resume Flonase  and Claritin.   Follow up as needed.

## 2024-09-11 ENCOUNTER — Other Ambulatory Visit: Payer: Self-pay

## 2024-09-11 ENCOUNTER — Ambulatory Visit: Payer: Self-pay | Admitting: Primary Care

## 2024-09-11 DIAGNOSIS — I10 Essential (primary) hypertension: Secondary | ICD-10-CM

## 2024-09-11 MED ORDER — LISINOPRIL 10 MG PO TABS: 10.0000 mg | ORAL_TABLET | Freq: Every day | ORAL | 1 refills | Status: AC

## 2024-09-11 NOTE — Telephone Encounter (Signed)
 Rx sent electronically.

## 2024-09-25 ENCOUNTER — Ambulatory Visit: Admitting: Family Medicine

## 2024-09-25 ENCOUNTER — Other Ambulatory Visit (HOSPITAL_COMMUNITY): Payer: Self-pay

## 2024-09-25 ENCOUNTER — Telehealth: Payer: Self-pay

## 2024-09-25 ENCOUNTER — Encounter: Payer: Self-pay | Admitting: Family Medicine

## 2024-09-25 VITALS — BP 130/70 | HR 96 | Temp 97.3°F | Ht 66.0 in | Wt 179.5 lb

## 2024-09-25 DIAGNOSIS — Z23 Encounter for immunization: Secondary | ICD-10-CM | POA: Diagnosis not present

## 2024-09-25 DIAGNOSIS — R109 Unspecified abdominal pain: Secondary | ICD-10-CM | POA: Diagnosis not present

## 2024-09-25 MED ORDER — DICYCLOMINE HCL 10 MG PO CAPS: 10.0000 mg | ORAL_CAPSULE | Freq: Three times a day (TID) | ORAL | 0 refills | Status: DC

## 2024-09-25 NOTE — Telephone Encounter (Signed)
 Pharmacy Patient Advocate Encounter   Received notification from CoverMyMeds that prior authorization for Dicyclomine HCl 10MG  capsules is required/requested.   Insurance verification completed.   The patient is insured through [employer].   Per test claim: PA required; PA submitted to above mentioned insurance via Latent Key/confirmation #/EOC [id number] Status is pending

## 2024-09-25 NOTE — Progress Notes (Signed)
 Patient ID: Bridget Romero, female    DOB: [birth year], 78 y.o.   MRN: [id number]  This visit was conducted in person.  BP 130/70   Pulse 96   Temp (!) 97.3 F (36.3 C) (Temporal)   Ht 5' 6 (1.676 m)   Wt 179 lb 8 oz (81.4 kg)   SpO2 99%   BMI 28.97 kg/m    CC:  Chief Complaint  Patient presents with   Bowel Movements after Eating    Subjective:   HPI: Bridget Romero is a 78 y.o. female presenting on 09/25/2024 for Bowel Movements after Eating  She has been noting off and on in last 2 months... BM after eating. Occ watery, occ regular consistency. Mild abd pain relieved with BM, no cramping or bloating. Going 2 times per day.  Not explosive.  No blood in stool.  No change in diet, healthy overall.    No proceeding, N/V.  Taking iron  daily. Hemoglobin at last check 05/2024 improving and iron  improving.   Has not tried any OTC med.   Fatigued but not sleeping well lately.  Relevant past medical, surgical, family and social history reviewed and updated as indicated. Interim medical history since our last visit reviewed. Allergies and medications reviewed and updated. Outpatient Medications Prior to Visit  Medication Sig Dispense Refill   Accu-Chek FastClix Lancets MISC USE TO TEST BLOOD SUGAR TWICE DAILY DX E11.40 102 each 11   ACCU-CHEK SMARTVIEW test strip USE AS DIRECTED TO CHECK BLOOD SUGAR TWICE DAILY 100 each 6   acetaminophen  (TYLENOL ) 500 MG tablet Take 500 mg by mouth every 6 (six) hours as needed.     Alcohol Swabs (B-D SINGLE USE SWABS REGULAR) PADS Use to test blood sugar once daily dx: 250.00 100 each 3   BD INSULIN  SYRINGE ULTRAFINE 31G X 5/16 0.5 ML MISC USE ONE SYRINGE DAILY TO INJECT LANTUS  UNDER THE SKIN. 100 each 4   BD SYRINGE SLIP TIP 25G X 5/8 1 ML MISC      Blood Glucose Calibration (ACCU-CHEK AVIVA) SOLN Use as directed 1 each 3   Cholecalciferol (VITAMIN D ) 1000 UNITS capsule Take 1,000 Units by mouth daily.     ferrous sulfate  325 (65 FE) MG tablet Take 325 mg by mouth daily with breakfast.     folic acid  (FOLVITE ) 1 MG tablet Take 1 mg by mouth daily.     gabapentin (NEURONTIN) 300 MG capsule Take 1 capsule by mouth 3 (three) times daily.     Insulin  Syringe-Needle U-100 31G X 5/16 1 ML MISC by Does not apply route.     LANTUS  100 UNIT/ML injection INJECT TEN TO 20 UNITS INTO THE SKIN DAILY AS DIRECTED (VIAL IS GOOD FOR 28 DAYS AFTER FIRST USE) 10 mL 3   lisinopril  (ZESTRIL ) 10 MG tablet Take 1 tablet (10 mg total) by mouth daily. 90 tablet 1   metFORMIN  (GLUCOPHAGE ) 1000 MG tablet TAKE ONE TABLET BY MOUTH TWICE A DAY 180 tablet 3   methotrexate 50 MG/2ML injection      Multiple Vitamin (MULTIVITAMIN) tablet Take 1 tablet by mouth daily.     nystatin  (MYCOSTATIN /NYSTOP ) powder Apply 1 Application topically 3 (three) times daily as needed.     predniSONE  (DELTASONE ) 2.5 MG tablet Take 2.5 mg by mouth daily.     tamsulosin  (FLOMAX ) 0.4 MG CAPS capsule Take 1 capsule (0.4 mg total) by mouth daily. 30 capsule 5   temazepam  (RESTORIL ) 15 MG capsule Take 1  capsule (15 mg total) by mouth at bedtime as needed for sleep. 30 capsule 0   tiZANidine  (ZANAFLEX ) 2 MG tablet Take 1-2 tablets (2-4 mg total) by mouth at bedtime as needed for muscle spasms. 30 tablet 0   trospium  (SANCTURA ) 20 MG tablet Take 1 tab 1 hour prior to bedtime 30 tablet 11   methotrexate 250 MG/10ML injection Inject into the skin once a week.     nystatin  (MYCOSTATIN /NYSTOP ) powder Apply 1 Application topically 3 (three) times daily. (Patient taking differently: Apply 1 Application topically 3 (three) times daily as needed.) 60 g 2   No facility-administered medications prior to visit.     Per HPI unless specifically indicated in ROS section below Review of Systems  Constitutional:  Positive for fatigue. Negative for fever.  HENT:  Negative for congestion.   Eyes:  Negative for pain.  Respiratory:  Negative for cough and shortness of breath.    Cardiovascular:  Negative for chest pain, palpitations and leg swelling.  Gastrointestinal:  Negative for abdominal pain.  Genitourinary:  Negative for dysuria and vaginal bleeding.  Musculoskeletal:  Negative for back pain.  Neurological:  Negative for syncope, light-headedness and headaches.  Psychiatric/Behavioral:  Negative for dysphoric mood.    Objective:  BP 130/70   Pulse 96   Temp (!) 97.3 F (36.3 C) (Temporal)   Ht 5' 6 (1.676 m)   Wt 179 lb 8 oz (81.4 kg)   SpO2 99%   BMI 28.97 kg/m   Wt Readings from Last 3 Encounters:  09/25/24 179 lb 8 oz (81.4 kg)  09/10/24 181 lb (82.1 kg)  09/03/24 179 lb (81.2 kg)      Physical Exam Constitutional:      General: She is not in acute distress.    Appearance: Normal appearance. She is well-developed. She is not ill-appearing or toxic-appearing.  HENT:     Head: Normocephalic.     Right Ear: Hearing, tympanic membrane, ear canal and external ear normal. Tympanic membrane is not erythematous, retracted or bulging.     Left Ear: Hearing, tympanic membrane, ear canal and external ear normal. Tympanic membrane is not erythematous, retracted or bulging.     Nose: No mucosal edema or rhinorrhea.     Right Sinus: No maxillary sinus tenderness or frontal sinus tenderness.     Left Sinus: No maxillary sinus tenderness or frontal sinus tenderness.     Mouth/Throat:     Pharynx: Uvula midline.  Eyes:     General: Lids are normal. Lids are everted, no foreign bodies appreciated.     Conjunctiva/sclera: Conjunctivae normal.     Pupils: Pupils are equal, round, and reactive to light.  Neck:     Thyroid : No thyroid  mass or thyromegaly.     Vascular: No carotid bruit.     Trachea: Trachea normal.  Cardiovascular:     Rate and Rhythm: Normal rate and regular rhythm.     Pulses: Normal pulses.     Heart sounds: Normal heart sounds, S1 normal and S2 normal. No murmur heard.    No friction rub. No gallop.  Pulmonary:     Effort:  Pulmonary effort is normal. No tachypnea or respiratory distress.     Breath sounds: Normal breath sounds. No decreased breath sounds, wheezing, rhonchi or rales.  Abdominal:     General: Bowel sounds are normal.     Palpations: Abdomen is soft.     Tenderness: There is no abdominal tenderness. There is no right CVA  tenderness.     Hernia: No hernia is present.  Musculoskeletal:     Cervical back: Normal range of motion and neck supple.  Skin:    General: Skin is warm and dry.     Findings: No rash.  Neurological:     Mental Status: She is alert.  Psychiatric:        Mood and Affect: Mood is not anxious or depressed.        Speech: Speech normal.        Behavior: Behavior normal. Behavior is cooperative.        Thought Content: Thought content normal.        Judgment: Judgment normal.       Results for orders placed or performed in visit on 09/10/24  CBC   Collection Time: 09/10/24  9:42 AM  Result Value Ref Range   WBC 6.3 4.0 - 10.5 K/uL   RBC 3.27 (L) 3.87 - 5.11 Mil/uL   Platelets 254.0 150.0 - 400.0 K/uL   Hemoglobin 9.6 (L) 12.0 - 15.0 g/dL   HCT 70.0 (L) 63.9 - 53.9 %   MCV 91.4 78.0 - 100.0 fl   MCHC 32.3 30.0 - 36.0 g/dL   RDW 85.8 88.4 - 84.4 %  IBC + Ferritin   Collection Time: 09/10/24  9:42 AM  Result Value Ref Range   Iron  78 42 - 145 ug/dL   Transferrin 824.9 (L) 212.0 - 360.0 mg/dL   Saturation Ratios 68.1 20.0 - 50.0 %   Ferritin 121.7 10.0 - 291.0 ng/mL   TIBC 245.0 (L) 250.0 - 450.0 mcg/dL    Assessment and Plan  Need for influenza vaccination -     Flu vaccine HIGH DOSE PF(Fluzone Trivalent)  Abdominal cramping Assessment & Plan: Acute in the last 2 months, abdominal cramping relieved with bowel movement triggered by eating.  To soft occasionally watery stools. No red flags, no blood in stool.  Normal exam in office today. Symptoms are most consistent with irritable bowel syndrome pain/diarrhea predominant.  Encouraged patient to avoid greasy  foods and gas-forming foods as well as work on stress reduction. Lab work in the last 6 months has been within the normal range except for iron  deficiency anemia chronically but improved. Will have patient try dicyclomine as needed with meals for the next few weeks.  If symptoms are not improving or if they are changing we will consider further workup.  Return and ER precautions provided.   Other orders -     Dicyclomine HCl; Take 1 capsule (10 mg total) by mouth 3 (three) times daily before meals.  Dispense: 30 capsule; Refill: 0    No follow-ups on file.   Greig Ring, MD

## 2024-09-25 NOTE — Assessment & Plan Note (Addendum)
 Acute in the last 2 months, abdominal cramping relieved with bowel movement triggered by eating.  To soft occasionally watery stools. No red flags, no blood in stool.  Normal exam in office today. Symptoms are most consistent with irritable bowel syndrome pain/diarrhea predominant.  Encouraged patient to avoid greasy foods and gas-forming foods as well as work on stress reduction. Lab work in the last 6 months has been within the normal range except for iron  deficiency anemia chronically but improved. Will have patient try dicyclomine as needed with meals for the next few weeks.  If symptoms are not improving or if they are changing we will consider further workup.  Return and ER precautions provided.

## 2024-09-26 NOTE — Telephone Encounter (Signed)
 Pharmacy Patient Advocate Encounter  Received notification from [employer] that Prior Authorization for Dicyclomine HCl 10MG  capsules  has been APPROVED from 12/26/2023 to 12/24/2025   PA #/Case ID/Reference #: [id number]

## 2024-10-06 ENCOUNTER — Ambulatory Visit
Admission: RE | Admit: 2024-10-06 | Discharge: 2024-10-06 | Disposition: A | Discharge status: Home / Self Care | Source: Ambulatory Visit | Attending: Rheumatology | Admitting: Rheumatology

## 2024-10-06 DIAGNOSIS — M3322 Polymyositis with myopathy: Secondary | ICD-10-CM | POA: Insufficient documentation

## 2024-10-06 MED ORDER — ACETAMINOPHEN 325 MG PO TABS
ORAL_TABLET | ORAL | Status: AC
  Filled 2024-10-06: qty 2

## 2024-10-06 MED ORDER — METHYLPREDNISOLONE SODIUM SUCC 40 MG IJ SOLR
INTRAMUSCULAR | Status: AC
  Filled 2024-10-06: qty 1

## 2024-10-06 MED ORDER — IMMUNE GLOBULIN (HUMAN) 20 GM/200ML IV SOLN
500.0000 mg/kg | Freq: Once | INTRAVENOUS | Status: AC
  Administered 2024-10-06: 40 g via INTRAVENOUS
  Filled 2024-10-06: qty 400

## 2024-10-06 MED ORDER — DIPHENHYDRAMINE HCL 25 MG PO CAPS
25.0000 mg | ORAL_CAPSULE | Freq: Once | ORAL | Status: AC
  Administered 2024-10-06: 25 mg via ORAL

## 2024-10-06 MED ORDER — ACETAMINOPHEN 325 MG PO TABS
650.0000 mg | ORAL_TABLET | Freq: Once | ORAL | Status: AC
  Administered 2024-10-06: 650 mg via ORAL

## 2024-10-06 MED ORDER — METHYLPREDNISOLONE SODIUM SUCC 40 MG IJ SOLR
10.0000 mg | Freq: Once | INTRAMUSCULAR | Status: AC
  Administered 2024-10-06: 10 mg via INTRAVENOUS

## 2024-10-06 MED ORDER — DIPHENHYDRAMINE HCL 25 MG PO CAPS
ORAL_CAPSULE | ORAL | Status: AC
  Filled 2024-10-06: qty 1

## 2024-10-07 ENCOUNTER — Ambulatory Visit
Admission: RE | Admit: 2024-10-07 | Discharge: 2024-10-07 | Disposition: A | Discharge status: Home / Self Care | Source: Ambulatory Visit | Attending: Rheumatology | Admitting: Rheumatology

## 2024-10-07 DIAGNOSIS — M3322 Polymyositis with myopathy: Secondary | ICD-10-CM | POA: Insufficient documentation

## 2024-10-07 MED ORDER — DIPHENHYDRAMINE HCL 25 MG PO CAPS
25.0000 mg | ORAL_CAPSULE | Freq: Once | ORAL | Status: AC
Start: 2024-10-07 — End: 2024-10-07
  Administered 2024-10-07: 25 mg via ORAL

## 2024-10-07 MED ORDER — METHYLPREDNISOLONE SODIUM SUCC 40 MG IJ SOLR
10.0000 mg | Freq: Once | INTRAMUSCULAR | Status: AC
Start: 2024-10-07 — End: 2024-10-07
  Administered 2024-10-07: 10 mg via INTRAVENOUS

## 2024-10-07 MED ORDER — DIPHENHYDRAMINE HCL 25 MG PO CAPS
ORAL_CAPSULE | ORAL | Status: AC
  Filled 2024-10-07: qty 1

## 2024-10-07 MED ORDER — IMMUNE GLOBULIN (HUMAN) 10 GM/100ML IV SOLN
500.0000 mg/kg | Freq: Once | INTRAVENOUS | Status: AC
  Administered 2024-10-07: 40 g via INTRAVENOUS
  Filled 2024-10-07: qty 400

## 2024-10-07 MED ORDER — ACETAMINOPHEN 325 MG PO TABS
ORAL_TABLET | ORAL | Status: AC
  Filled 2024-10-07: qty 2

## 2024-10-07 MED ORDER — ACETAMINOPHEN 325 MG PO TABS
650.0000 mg | ORAL_TABLET | Freq: Once | ORAL | Status: AC
Start: 2024-10-07 — End: 2024-10-07
  Administered 2024-10-07: 650 mg via ORAL

## 2024-10-07 MED ORDER — METHYLPREDNISOLONE SODIUM SUCC 40 MG IJ SOLR
INTRAMUSCULAR | Status: AC
  Filled 2024-10-07: qty 1

## 2024-10-07 MED FILL — Immune Globulin (Human) IV Soln 40 GM/400ML: INTRAVENOUS | Qty: 400 | Status: AC

## 2024-10-08 ENCOUNTER — Ambulatory Visit
Admission: RE | Admit: 2024-10-08 | Discharge: 2024-10-08 | Disposition: A | Discharge status: Home / Self Care | Source: Ambulatory Visit | Attending: Rheumatology | Admitting: Rheumatology

## 2024-10-08 DIAGNOSIS — M3322 Polymyositis with myopathy: Secondary | ICD-10-CM | POA: Diagnosis present

## 2024-10-08 MED ORDER — DIPHENHYDRAMINE HCL 25 MG PO CAPS
25.0000 mg | ORAL_CAPSULE | Freq: Once | ORAL | Status: AC
  Administered 2024-10-08: 25 mg via ORAL

## 2024-10-08 MED ORDER — ACETAMINOPHEN 325 MG PO TABS
650.0000 mg | ORAL_TABLET | Freq: Once | ORAL | Status: AC
  Administered 2024-10-08: 650 mg via ORAL

## 2024-10-08 MED ORDER — DIPHENHYDRAMINE HCL 25 MG PO CAPS
ORAL_CAPSULE | ORAL | Status: AC
  Filled 2024-10-08: qty 1

## 2024-10-08 MED ORDER — METHYLPREDNISOLONE SODIUM SUCC 40 MG IJ SOLR
10.0000 mg | Freq: Once | INTRAMUSCULAR | Status: AC
  Administered 2024-10-08: 10 mg via INTRAVENOUS

## 2024-10-08 MED ORDER — ACETAMINOPHEN 325 MG PO TABS
ORAL_TABLET | ORAL | Status: AC
  Filled 2024-10-08: qty 2

## 2024-10-08 MED ORDER — METHYLPREDNISOLONE SODIUM SUCC 40 MG IJ SOLR
INTRAMUSCULAR | Status: AC
  Filled 2024-10-08: qty 1

## 2024-10-08 MED ORDER — IMMUNE GLOBULIN (HUMAN) 20 GM/200ML IV SOLN
500.0000 mg/kg | Freq: Once | INTRAVENOUS | Status: AC
  Administered 2024-10-08: 40 g via INTRAVENOUS
  Filled 2024-10-08: qty 400

## 2024-10-09 ENCOUNTER — Ambulatory Visit
Admission: RE | Admit: 2024-10-09 | Discharge: 2024-10-09 | Disposition: A | Discharge status: Home / Self Care | Source: Ambulatory Visit | Attending: Rheumatology | Admitting: Rheumatology

## 2024-10-09 DIAGNOSIS — M3322 Polymyositis with myopathy: Secondary | ICD-10-CM | POA: Insufficient documentation

## 2024-10-09 MED ORDER — IMMUNE GLOBULIN (HUMAN) 20 GM/200ML IV SOLN
500.0000 mg/kg | Freq: Once | INTRAVENOUS | Status: AC
  Administered 2024-10-09: 40 g via INTRAVENOUS
  Filled 2024-10-09 (×2): qty 400

## 2024-10-09 MED ORDER — DIPHENHYDRAMINE HCL 25 MG PO CAPS
ORAL_CAPSULE | ORAL | Status: AC
  Filled 2024-10-09: qty 1

## 2024-10-09 MED ORDER — ACETAMINOPHEN 325 MG PO TABS
650.0000 mg | ORAL_TABLET | Freq: Once | ORAL | Status: AC
  Administered 2024-10-09: 650 mg via ORAL

## 2024-10-09 MED ORDER — METHYLPREDNISOLONE SODIUM SUCC 40 MG IJ SOLR
10.0000 mg | Freq: Once | INTRAMUSCULAR | Status: AC
  Administered 2024-10-09: 10 mg via INTRAVENOUS

## 2024-10-09 MED ORDER — METHYLPREDNISOLONE SODIUM SUCC 40 MG IJ SOLR
INTRAMUSCULAR | Status: AC
  Filled 2024-10-09: qty 1

## 2024-10-09 MED ORDER — ACETAMINOPHEN 325 MG PO TABS
ORAL_TABLET | ORAL | Status: AC
  Filled 2024-10-09: qty 2

## 2024-10-09 MED ORDER — DIPHENHYDRAMINE HCL 25 MG PO CAPS
25.0000 mg | ORAL_CAPSULE | Freq: Once | ORAL | Status: AC
  Administered 2024-10-09: 25 mg via ORAL

## 2024-10-14 ENCOUNTER — Ambulatory Visit: Admitting: Nurse Practitioner

## 2024-10-14 VITALS — BP 122/88 | HR 85 | Temp 98.3°F | Ht 66.0 in | Wt 178.4 lb

## 2024-10-14 DIAGNOSIS — J310 Chronic rhinitis: Secondary | ICD-10-CM | POA: Diagnosis not present

## 2024-10-14 DIAGNOSIS — J020 Streptococcal pharyngitis: Secondary | ICD-10-CM

## 2024-10-14 DIAGNOSIS — J029 Acute pharyngitis, unspecified: Secondary | ICD-10-CM

## 2024-10-14 DIAGNOSIS — R051 Acute cough: Secondary | ICD-10-CM | POA: Diagnosis not present

## 2024-10-14 DIAGNOSIS — D849 Immunodeficiency, unspecified: Secondary | ICD-10-CM

## 2024-10-14 LAB — POCT RAPID STREP A (OFFICE): Rapid Strep A Screen: POSITIVE — AB

## 2024-10-14 MED ORDER — DICYCLOMINE HCL 10 MG PO CAPS: 10.0000 mg | ORAL_CAPSULE | Freq: Three times a day (TID) | ORAL | 0 refills | Status: DC

## 2024-10-14 MED ORDER — AMOXICILLIN 500 MG PO CAPS: 500.0000 mg | ORAL_CAPSULE | Freq: Two times a day (BID) | ORAL | 0 refills | Status: AC

## 2024-10-14 NOTE — Progress Notes (Signed)
 Acute Office Visit  Subjective:     Patient ID: Bridget Romero, female    DOB: Aug 29, 1946, 78 y.o.   MRN: [id number]  Chief Complaint  Patient presents with   URI    Pt complains of dry cough, poor appetite, and runny nose, sore throat ongoing for two weeks. Has taken OTC but states its not working.      Patient is in today for sick symptoms with a history of DM2, HTN, polymyositis on immune suppressing medications 9 methotrexate and prednisone )  Discussed the use of AI scribe software for clinical note transcription with the patient, who gave verbal consent to proceed.  History of Present Illness Bridget Romero is a 78 year old female with polymyositis who presents with a sore throat and upper respiratory symptoms.  She has been experiencing symptoms for approximately two weeks, which she believes may have been exacerbated by being out in the rain. Her symptoms include a sore throat, runny nose, dry cough, and occasional epistaxis. She has been using over-the-counter medications, including a product called 'MUCUS' and Tylenol , to manage her symptoms. No fever or chills, but she feels more tired than usual.  Her throat feels slightly better but remains somewhat sore. Initially, she had a runny nose, which has improved, but she now has some nasal congestion. Her cough is not severe, and she sometimes brings up phlegm. No shortness of breath, headache, or new muscle aches.  Her past medical history includes polymyositis, for which she takes methotrexate and prednisone . She also takes gabapentin, Lantus , lisinopril , metformin , and temazepam  as needed for sleep. She has a history of IBS and uses Bentyl (dicyclomine) for symptom management, although she is currently out of this medication. She uses [facility] for her prescriptions. She has not had a recent COVID test or vaccine but has received a flu shot. She reports allergies to atorvastatin  and pravastatin .   Review  of Systems  Constitutional:  Positive for malaise/fatigue. Negative for chills and fever.  HENT:  Positive for congestion and sore throat. Negative for ear discharge, ear pain and sinus pain.   Respiratory:  Positive for cough and sputum production. Negative for shortness of breath.   Gastrointestinal:  Positive for diarrhea. Negative for abdominal pain, nausea and vomiting.  Neurological:  Positive for headaches.        Objective:    BP 122/88   Pulse 85   Temp 98.3 F (36.8 C) (Oral)   Ht 5' 6 (1.676 m)   Wt 178 lb 6.4 oz (80.9 kg)   SpO2 99%   BMI 28.79 kg/m  BP Readings from Last 3 Encounters:  10/14/24 122/88  09/25/24 130/70  09/10/24 136/82   Wt Readings from Last 3 Encounters:  10/14/24 178 lb 6.4 oz (80.9 kg)  09/25/24 179 lb 8 oz (81.4 kg)  09/10/24 181 lb (82.1 kg)   SpO2 Readings from Last 3 Encounters:  10/14/24 99%  09/25/24 99%  09/10/24 98%      Physical Exam Vitals and nursing note reviewed.  Constitutional:      Appearance: Normal appearance.  HENT:     Right Ear: Tympanic membrane, ear canal and external ear normal.     Left Ear: Tympanic membrane, ear canal and external ear normal.     Nose:     Right Sinus: No maxillary sinus tenderness or frontal sinus tenderness.     Left Sinus: No maxillary sinus tenderness or frontal sinus tenderness.     Mouth/Throat:  Mouth: Mucous membranes are moist.     Pharynx: Oropharynx is clear.  Cardiovascular:     Rate and Rhythm: Normal rate and regular rhythm.     Heart sounds: Normal heart sounds.  Pulmonary:     Effort: Pulmonary effort is normal.     Breath sounds: Normal breath sounds.  Abdominal:     General: Bowel sounds are normal.     Tenderness: There is no abdominal tenderness.  Lymphadenopathy:     Cervical: No cervical adenopathy.  Neurological:     Mental Status: She is alert.     Results for orders placed or performed in visit on 10/14/24  Rapid Strep A  Result Value Ref Range    Rapid Strep A Screen Positive (A) Negative        Assessment & Plan:   Problem List Items Addressed This Visit   None Visit Diagnoses       Sore throat    -  Primary   Relevant Orders   Rapid Strep A (Completed)     Acute cough         Rhinitis, unspecified type         Immunocompromised         Strep pharyngitis       Relevant Medications   amoxicillin  (AMOXIL ) 500 MG capsule      Assessment and Plan Assessment & Plan Strep Pharyngitis  in immunosuppressed patient Immunosuppression from methotrexate and prednisone  may prolong infection and necessitate antibiotics. Awaiting rapid strep test to exclude streptococcal pharyngitis. - Perform rapid strep test. - Strep test positive. Amoxicillin  500 mg BID for 10 days  Polymyositis Managed with methotrexate and prednisone  under rheumatologist care.  Irritable bowel syndrome Out of dicyclomine. Refill prescription   Type 2 diabetes mellitus Managed with Lantus  and metformin .  Hypertension Lisinopril  as part of medication regimen.  Meds ordered this encounter  Medications   dicyclomine (BENTYL) 10 MG capsule    Sig: Take 1 capsule (10 mg total) by mouth 3 (three) times daily before meals.    Dispense:  30 capsule    Refill:  0    Supervising Provider:   RANDEEN HARDY A [[id number]]   amoxicillin  (AMOXIL ) 500 MG capsule    Sig: Take 1 capsule (500 mg total) by mouth 2 (two) times daily for 10 days.    Dispense:  20 capsule    Refill:  0    Supervising Provider:   RANDEEN HARDY A [[id number]]    Return if symptoms worsen or fail to improve.  Adina Crandall, NP

## 2024-10-14 NOTE — Patient Instructions (Signed)
 Nice to see you today  Your strep test was positive in office I have sent in some antibiotics for you to take Follow up if you do not improve

## 2024-10-28 ENCOUNTER — Telehealth: Payer: Self-pay

## 2024-10-28 MED ORDER — INSULIN GLARGINE 100 UNIT/ML ~~LOC~~ SOLN: SUBCUTANEOUS | 2 refills | Status: DC

## 2024-10-29 ENCOUNTER — Ambulatory Visit: Admitting: Physician Assistant

## 2024-10-29 VITALS — BP 159/83 | HR 90 | Ht 69.0 in | Wt 181.0 lb

## 2024-10-29 DIAGNOSIS — R3912 Poor urinary stream: Secondary | ICD-10-CM

## 2024-10-29 DIAGNOSIS — R3989 Other symptoms and signs involving the genitourinary system: Secondary | ICD-10-CM | POA: Diagnosis not present

## 2024-10-29 DIAGNOSIS — R351 Nocturia: Secondary | ICD-10-CM

## 2024-10-29 LAB — BLADDER SCAN AMB NON-IMAGING: Scan Result: 27

## 2024-10-30 ENCOUNTER — Ambulatory Visit: Payer: Self-pay | Admitting: Physician Assistant

## 2024-11-01 MED ORDER — TROSPIUM CHLORIDE 20 MG PO TABS: ORAL_TABLET | ORAL | 11 refills | Status: AC

## 2024-11-01 NOTE — Progress Notes (Signed)
 10/29/2024 8:21 AM   Bridget Romero [birth year] [id number]  CC: Chief Complaint  Patient presents with   Follow-up   HPI: Bridget Romero is a 78 y.o. female with PMH uterine fibroids, DM, PM bladder pressure, and nocturia on nighttime trospium  20 mg who presents today for follow-up.  I saw her in clinic most recently in June.  At that time, she described some brief weak stream.  I offered her Flomax .  Today she reports she started pushing fluids and the weak stream resolved.  She never took the Flomax .    She continues to have suprapubic pressure at night when lying supine.  I got a pelvic ultrasound on her back in March, which showed interval reduction in size of an anterior left lateral fundal intramural fibroid.  She is doing well on p.m. trospium  and describes nocturia x 1.  She is not having any constipation, dry mouth, or dry eye with the medication.  PVR 27mL.  PMH: Past Medical History:  Diagnosis Date   Allergy    Asthma    Diabetes mellitus    Hyperlipidemia    Hypertension    Neuromuscular disorder (HCC)    polymyositis   Osteoarthritis    Vitamin B12 deficiency     Surgical History: Past Surgical History:  Procedure Laterality Date   CESAREAN SECTION     COLONOSCOPY WITH PROPOFOL  N/A 03/07/2016   Procedure: COLONOSCOPY WITH PROPOFOL ;  Surgeon: Gladis RAYMOND Mariner, MD;  Location: [facility] ENDOSCOPY;  Service: Endoscopy;  Laterality: N/A;    Home Medications:  Allergies as of 10/29/2024       Reactions   Atorvastatin  Other (See Comments)   myalgias   Pravastatin  Other (See Comments)   Muscle pain        Medication List        Accurate as of October 29, 2024 11:59 PM. If you have any questions, ask your nurse or doctor.          Accu-Chek Aviva Soln Use as directed   Accu-Chek FastClix Lancets Misc USE TO TEST BLOOD SUGAR TWICE DAILY DX E11.40   Accu-Chek SmartView test strip Generic drug: glucose blood USE AS DIRECTED TO  CHECK BLOOD SUGAR TWICE DAILY   acetaminophen  500 MG tablet Commonly known as: TYLENOL  Take 500 mg by mouth every 6 (six) hours as needed.   B-D SINGLE USE SWABS REGULAR Pads Use to test blood sugar once daily dx: 250.00   BD Syringe Slip Tip 25G X 5/8 1 ML Misc Generic drug: TUBERCULIN SYR 1CC/25GX5/8   dicyclomine 10 MG capsule Commonly known as: BENTYL Take 1 capsule (10 mg total) by mouth 3 (three) times daily before meals.   ferrous sulfate 325 (65 FE) MG tablet Take 325 mg by mouth daily with breakfast.   folic acid  1 MG tablet Commonly known as: FOLVITE  Take 1 mg by mouth daily.   gabapentin 300 MG capsule Commonly known as: NEURONTIN Take 1 capsule by mouth 3 (three) times daily.   insulin  glargine 100 UNIT/ML injection Commonly known as: Lantus  INJECT TEN TO 20 UNITS INTO THE SKIN DAILY AS DIRECTED (VIAL IS GOOD FOR 28 DAYS AFTER FIRST USE)   Insulin  Syringe-Needle U-100 31G X 5/16 1 ML Misc by Does not apply route.   BD Insulin  Syringe Ultrafine 31G X 5/16 0.5 ML Misc Generic drug: Insulin  Syringe-Needle U-100 USE ONE SYRINGE DAILY TO INJECT LANTUS  UNDER THE SKIN.   lisinopril  10 MG tablet Commonly known as: ZESTRIL  Take 1  tablet (10 mg total) by mouth daily.   metFORMIN  1000 MG tablet Commonly known as: GLUCOPHAGE  TAKE ONE TABLET BY MOUTH TWICE A DAY   methotrexate 50 MG/2ML injection   multivitamin tablet Take 1 tablet by mouth daily.   nystatin  powder Commonly known as: MYCOSTATIN /NYSTOP  Apply 1 Application topically 3 (three) times daily as needed.   predniSONE  2.5 MG tablet Commonly known as: DELTASONE  Take 2.5 mg by mouth daily.   tamsulosin  0.4 MG Caps capsule Commonly known as: FLOMAX  Take 1 capsule (0.4 mg total) by mouth daily.   temazepam  15 MG capsule Commonly known as: RESTORIL  Take 1 capsule (15 mg total) by mouth at bedtime as needed for sleep.   tiZANidine  2 MG tablet Commonly known as: ZANAFLEX  Take 1-2 tablets (2-4  mg total) by mouth at bedtime as needed for muscle spasms.   trospium  20 MG tablet Commonly known as: SANCTURA  Take 1 tab 1 hour prior to bedtime   Vitamin D  1000 units capsule Take 1,000 Units by mouth daily.        Allergies:  Allergies  Allergen Reactions   Atorvastatin  Other (See Comments)    myalgias   Pravastatin  Other (See Comments)    Muscle pain    Family History: Family History  Problem Relation Age of Onset   Heart disease Mother    Cancer Father    Breast cancer Neg Hx     Social History:   reports that she has quit smoking. She has never been exposed to tobacco smoke. She has never used smokeless tobacco. She reports that she does not drink alcohol and does not use drugs.  Physical Exam: BP (!) 159/83   Pulse 90   Ht 5' 9 (1.753 m)   Wt 181 lb (82.1 kg)   BMI 26.73 kg/m   Constitutional:  Alert and oriented, no acute distress, nontoxic appearing HEENT: [place], AT Cardiovascular: No clubbing, cyanosis, or edema Respiratory: Normal respiratory effort, no increased work of breathing Skin: No rashes, bruises or suspicious lesions Neurologic: Grossly intact, no focal deficits, moving all 4 extremities Psychiatric: Normal mood and affect  Laboratory Data: Results for orders placed or performed in visit on 10/29/24  Bladder Scan (Post Void Residual) in office   Collection Time: 10/29/24  9:01 AM  Result Value Ref Range   Scan Result 27    Assessment & Plan:   1. Weak urinary stream (Primary) Resolved.  2. Sensation of pressure in bladder area Positional.  Suspect her uterine fibroid is related.  She prefers to defer GYN involvement at this time.  Will continue to monitor.  3. Nocturia Well-controlled on nighttime trospium .  No anticholinergic side effects.  She is emptying appropriately.  Will continue this. - Bladder Scan (Post Void Residual) in office - trospium  (SANCTURA ) 20 MG tablet; Take 1 tab 1 hour prior to bedtime  Dispense: 30 tablet;  Refill: 11   Return in about 1 year (around 10/29/2025) for Annual f/u with PVR.  Lucie Hones, PA-C  [facility] [place] [address] [place], [place] [address] [phone]

## 2024-11-04 ENCOUNTER — Inpatient Hospital Stay: Attending: Internal Medicine

## 2024-11-04 ENCOUNTER — Encounter: Payer: Self-pay | Admitting: Internal Medicine

## 2024-11-04 ENCOUNTER — Inpatient Hospital Stay: Admitting: Internal Medicine

## 2024-11-04 ENCOUNTER — Inpatient Hospital Stay

## 2024-11-04 VITALS — BP 139/86 | HR 71 | Temp 96.0°F | Resp 16 | Ht 69.0 in | Wt 178.3 lb

## 2024-11-04 DIAGNOSIS — E538 Deficiency of other specified B group vitamins: Secondary | ICD-10-CM | POA: Diagnosis not present

## 2024-11-04 DIAGNOSIS — D649 Anemia, unspecified: Secondary | ICD-10-CM | POA: Insufficient documentation

## 2024-11-04 DIAGNOSIS — M3320 Polymyositis, organ involvement unspecified: Secondary | ICD-10-CM | POA: Insufficient documentation

## 2024-11-04 DIAGNOSIS — Z809 Family history of malignant neoplasm, unspecified: Secondary | ICD-10-CM | POA: Diagnosis not present

## 2024-11-04 LAB — BASIC METABOLIC PANEL WITH GFR
Anion gap: 9 (ref 5–15)
BUN: 18 mg/dL (ref 8–23)
CO2: 25 mmol/L (ref 22–32)
Calcium: 9 mg/dL (ref 8.9–10.3)
Chloride: 102 mmol/L (ref 98–111)
Creatinine, Ser: 0.74 mg/dL (ref 0.44–1.00)
GFR, Estimated: >60 mL/min (ref >60)
Glucose, Bld: 180 mg/dL — ABNORMAL HIGH (ref 70–99)
Potassium: 3.8 mmol/L (ref 3.5–5.1)
Sodium: 136 mmol/L (ref 135–145)

## 2024-11-04 LAB — CBC WITH DIFFERENTIAL (CANCER CENTER ONLY)
Abs Immature Granulocytes: 0.02 K/uL (ref 0.00–0.07)
Basophils Absolute: 0 K/uL (ref 0.0–0.1)
Basophils Relative: 1 %
Eosinophils Absolute: 0.1 K/uL (ref 0.0–0.5)
Eosinophils Relative: 2 %
HCT: 30.7 % — ABNORMAL LOW (ref 36.0–46.0)
Hemoglobin: 9.6 g/dL — ABNORMAL LOW (ref 12.0–15.0)
Immature Granulocytes: 0 %
Lymphocytes Relative: 17 %
Lymphs Abs: 1 K/uL (ref 0.7–4.0)
MCH: 29.2 pg (ref 26.0–34.0)
MCHC: 31.3 g/dL (ref 30.0–36.0)
MCV: 93.3 fL (ref 80.0–100.0)
Monocytes Absolute: 0.3 K/uL (ref 0.1–1.0)
Monocytes Relative: 5 %
Neutro Abs: 4.7 K/uL (ref 1.7–7.7)
Neutrophils Relative %: 75 %
Platelet Count: 298 K/uL (ref 150–400)
RBC: 3.29 MIL/uL — ABNORMAL LOW (ref 3.87–5.11)
RDW: 14.3 % (ref 11.5–15.5)
WBC Count: 6.2 K/uL (ref 4.0–10.5)
nRBC: 0 % (ref 0.0–0.2)

## 2024-11-04 LAB — IRON AND TIBC
Iron: 133 ug/dL (ref 28–170)
Saturation Ratios: 48 % — ABNORMAL HIGH (ref 10.4–31.8)
TIBC: 274 ug/dL (ref 250–450)
UIBC: 141 ug/dL

## 2024-11-04 LAB — LACTATE DEHYDROGENASE: LDH: 148 U/L (ref 105–235)

## 2024-11-04 LAB — FOLATE: Folate: >20 ng/mL (ref >5.9)

## 2024-11-04 LAB — VITAMIN B12: Vitamin B-12: 381 pg/mL (ref 180–914)

## 2024-11-04 LAB — FERRITIN: Ferritin: 150 ng/mL (ref 11–307)

## 2024-11-04 NOTE — Assessment & Plan Note (Addendum)
#   Normocytic anemia: chronic Hb ~9-10. continue gentle iron  1 pill a day. March 2024- I sat- 17%  # However today Hb 9-10  stable-  Unclear etiology; myeloma panel-MM-NEG; K/l= 1.7.   Will check every 12 months- order at next visit.  Again discussed the possible bone marrow biopsy, but hold of for now.  Patient states that she will consider if Hb goes further low. No obvious concern for hemolysis.  # Polymyositis [Dr. [facility]ern[facility]le]; Clinically, she is doing well on IVIG 2 g/kg every 6 weeks [Short stay; per Rheumatology]; also prednisone  2.5 mg a day and MTX 10 mg SQ weekly-  Dr.Patel; Rheum- stable.   # Disposition:  # no venofer  today # follow up in 6 months- MD; cbc/bmp; LDH; iron  studies; ferrtin B12; folic acid ; possible venofer - Dr.B.

## 2024-11-04 NOTE — Progress Notes (Signed)
 [name] CONSULT NOTE  Patient Care Team: Bridget Charlie FERNS, MD as PCP - General (Internal Medicine) [name]., MD (Rheumatology) Rennie Cindy SAUNDERS, MD as Consulting Physician (Internal Medicine)  CHIEF COMPLAINTS/PURPOSE OF CONSULTATION: Polymyositis/; Anemia  #Polymyositis [Dr. Maryl; 2019; left arm biopsy]; low dose prednisone /methotrexate/IVIG infusions 400 mg kilogram daily x4 days every 6 weeks donnette Rheum; Dr.Patel]  Oncology History   No history exists.    HISTORY OF PRESENTING ILLNESS: Alone.  Walks independently.  Bridget Romero 78 y.o.  female with a history of polymyositis currently on IVIG [as per Rheumatology] is here for follow-up of anemia.  Overall doing well.  Denies any blood in stools. Stools are black from oral iron . No nausea or diarrhea. .  Denies any blood in urine. Patient denies any worsening weakness in arms or legs.   Review of Systems  Constitutional:  Positive for malaise/fatigue. Negative for chills, diaphoresis, fever and weight loss.  HENT:  Negative for nosebleeds and sore throat.   Eyes:  Negative for double vision.  Respiratory:  Negative for cough, hemoptysis, sputum production, shortness of breath and wheezing.   Cardiovascular:  Negative for chest pain, palpitations, orthopnea and leg swelling.  Gastrointestinal:  Negative for abdominal pain, blood in stool, constipation, diarrhea, heartburn, melena, nausea and vomiting.  Genitourinary:  Negative for dysuria, frequency and urgency.  Musculoskeletal:  Negative for back pain and joint pain.  Skin: Negative.  Negative for itching and rash.  Neurological:  Negative for dizziness, tingling, focal weakness, weakness and headaches.  Endo/Heme/Allergies:  Does not bruise/bleed easily.  Psychiatric/Behavioral:  Negative for depression. The patient is not nervous/anxious and does not have insomnia.      MEDICAL HISTORY:  Past Medical History:  Diagnosis  Date   Allergy    Asthma    Diabetes mellitus    Hyperlipidemia    Hypertension    Neuromuscular disorder (HCC)    polymyositis   Osteoarthritis    Vitamin B12 deficiency     SURGICAL HISTORY: Past Surgical History:  Procedure Laterality Date   CESAREAN SECTION     COLONOSCOPY WITH PROPOFOL  N/A 03/07/2016   Procedure: COLONOSCOPY WITH PROPOFOL ;  Surgeon: Gladis RAYMOND Mariner, MD;  Location: [facility] ENDOSCOPY;  Service: Endoscopy;  Laterality: N/A;    SOCIAL HISTORY: Social History   Socioeconomic History   Marital status: Widowed    Spouse name: Not on file   Number of children: 2   Years of education: Not on file   Highest education level: Not on file  Occupational History   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 [employer]    Comment:    Tobacco Use   Smoking status: Former    Passive exposure: Never   Smokeless tobacco: Never   Tobacco comments:    age 21 when stopped smoking  Vaping Use   Vaping status: Never Used  Substance and Sexual Activity   Alcohol use: No   Drug use: No   Sexual activity: Not on file  Other Topics Concern   Not on file  Social History Narrative   has living will   Son and daughter should make health care decisions for her   Would accept resuscitation   Not sure about tube feeds--but not excited about this   Social Drivers of Corporate Investment Banker Strain: Not on file  Food Insecurity: Not on file  Transportation Needs: Not on file  Physical Activity: Not on file  Stress: Not on  file  Social Connections: Not on file  Intimate Partner Violence: Not on file    FAMILY HISTORY: Family History  Problem Relation Age of Onset   Heart disease Mother    Cancer Father    Breast cancer Neg Hx     ALLERGIES:  is allergic to atorvastatin  and pravastatin .  MEDICATIONS:  Current Outpatient Medications  Medication Sig Dispense Refill   Accu-Chek FastClix Lancets MISC USE TO TEST BLOOD SUGAR TWICE DAILY DX E11.40 102 each  11   ACCU-CHEK SMARTVIEW test strip USE AS DIRECTED TO CHECK BLOOD SUGAR TWICE DAILY 100 each 6   acetaminophen  (TYLENOL ) 500 MG tablet Take 500 mg by mouth every 6 (six) hours as needed.     Alcohol Swabs (B-D SINGLE USE SWABS REGULAR) PADS Use to test blood sugar once daily dx: 250.00 100 each 3   BD INSULIN  SYRINGE ULTRAFINE 31G X 5/16 0.5 ML MISC USE ONE SYRINGE DAILY TO INJECT LANTUS  UNDER THE SKIN. 100 each 4   BD SYRINGE SLIP TIP 25G X 5/8 1 ML MISC      Blood Glucose Calibration (ACCU-CHEK AVIVA) SOLN Use as directed 1 each 3   Cholecalciferol (VITAMIN D ) 1000 UNITS capsule Take 1,000 Units by mouth daily.     ferrous sulfate 325 (65 FE) MG tablet Take 325 mg by mouth daily with breakfast.     folic acid  (FOLVITE ) 1 MG tablet Take 1 mg by mouth daily.     gabapentin (NEURONTIN) 300 MG capsule Take 1 capsule by mouth 3 (three) times daily.     insulin  glargine (LANTUS ) 100 UNIT/ML injection INJECT TEN TO 20 UNITS INTO THE SKIN DAILY AS DIRECTED (VIAL IS GOOD FOR 28 DAYS AFTER FIRST USE) 10 mL 2   Insulin  Syringe-Needle U-100 31G X 5/16 1 ML MISC by Does not apply route.     lisinopril  (ZESTRIL ) 10 MG tablet Take 1 tablet (10 mg total) by mouth daily. 90 tablet 1   metFORMIN  (GLUCOPHAGE ) 1000 MG tablet TAKE ONE TABLET BY MOUTH TWICE A DAY 180 tablet 3   methotrexate 50 MG/2ML injection      Multiple Vitamin (MULTIVITAMIN) tablet Take 1 tablet by mouth daily.     nystatin  (MYCOSTATIN /NYSTOP ) powder Apply 1 Application topically 3 (three) times daily as needed.     predniSONE  (DELTASONE ) 2.5 MG tablet Take 2.5 mg by mouth daily.     temazepam  (RESTORIL ) 15 MG capsule Take 1 capsule (15 mg total) by mouth at bedtime as needed for sleep. 30 capsule 0   trospium  (SANCTURA ) 20 MG tablet Take 1 tab 1 hour prior to bedtime 30 tablet 11   No current facility-administered medications for this visit.      SABRA  PHYSICAL EXAMINATION: ECOG PERFORMANCE STATUS: 0 - Asymptomatic  Vitals:    11/04/24 1001 11/04/24 1034  BP: (!) 146/77 139/86  Pulse: (!) 101 71  Resp: 16   Temp: (!) 96 F (35.6 C)   SpO2: 97%    Filed Weights   11/04/24 1001  Weight: 178 lb 4.8 oz (80.9 kg)    Physical Exam Vitals and nursing note reviewed.  Constitutional:      Comments:    HENT:     Head: Normocephalic and atraumatic.     Mouth/Throat:     Pharynx: Oropharynx is clear.  Eyes:     Extraocular Movements: Extraocular movements intact.     Pupils: Pupils are equal, round, and reactive to light.  Cardiovascular:  Rate and Rhythm: Normal rate and regular rhythm.  Pulmonary:     Comments: Decreased breath sounds bilaterally.  Abdominal:     Palpations: Abdomen is soft.  Musculoskeletal:        General: Normal range of motion.     Cervical back: Normal range of motion.  Skin:    General: Skin is warm.  Neurological:     General: No focal deficit present.     Mental Status: She is alert and oriented to person, place, and time.  Psychiatric:        Behavior: Behavior normal.        Judgment: Judgment normal.     LABORATORY DATA:  I have reviewed the data as listed Lab Results  Component Value Date   WBC 6.2 11/04/2024   HGB 9.6 (L) 11/04/2024   HCT 30.7 (L) 11/04/2024   MCV 93.3 11/04/2024   PLT 298 11/04/2024   Recent Labs    01/02/24 0933 03/27/24 1114 05/07/24 0954 11/04/24 0957  NA 142 138 135 136  K 4.2 4.6 3.9 3.8  CL 105 104 106 102  CO2 32 27 20* 25  GLUCOSE 178* 213* 220* 180*  BUN 18 24* 23 18  CREATININE 0.73 1.04 0.90 0.74  CALCIUM  9.4 9.9 9.0 9.0  GFRNONAA  --   --  >60 >60  PROT 6.4 7.1 7.6  --   ALBUMIN 4.0 3.7 3.1*  --   AST 32 37 35  --   ALT 33 43* 31  --   ALKPHOS 56 49 52  --   BILITOT 0.6 0.6 0.6  --     RADIOGRAPHIC STUDIES: I have personally reviewed the radiological images as listed and agreed with the findings in the report. No results found.  ASSESSMENT & PLAN:   Symptomatic anemia # Normocytic anemia: chronic Hb  ~9-10. continue gentle iron  1 pill a day. March 2024- I sat- 17%  # However today Hb 9-10  stable-  Unclear etiology; myeloma panel-MM-NEG; K/l= 1.7.   Will check every 12 months- order at next visit.  Again discussed the possible bone marrow biopsy, but hold of for now.  Patient states that she will consider if Hb goes further low. No obvious concern for hemolysis.  # Polymyositis [Dr. Kernodle]; Clinically, she is doing well on IVIG 2 g/kg every 6 weeks [Short stay; per Rheumatology]; also prednisone  2.5 mg a day and MTX 10 mg SQ weekly-  Dr.Patel; Rheum- stable.   # Disposition:  # no venofer  today # follow up in 6 months- MD; cbc/bmp; LDH; iron  studies; ferrtin B12; folic acid ; possible venofer - Dr.B.   All questions were answered. The patient knows to call the clinic with any problems, questions or concerns.    Cindy JONELLE Joe, MD 11/04/2024 7:56 PM

## 2024-11-04 NOTE — Progress Notes (Signed)
Patient states no concerns today.

## 2024-11-05 NOTE — Addendum Note (Signed)
 Encounter addended by: Analycia Khokhar L, RN on: 11/05/2024 8:22 AM  Actions taken: MAR administration edited, MAR administration accepted

## 2024-11-05 NOTE — Addendum Note (Signed)
 Encounter addended by: [place] Emma CROME, RN on: 11/05/2024 8:19 AM  Actions taken: MAR administration edited, MAR administration accepted

## 2024-11-13 ENCOUNTER — Telehealth: Payer: Self-pay | Admitting: Physician Assistant

## 2024-11-13 ENCOUNTER — Other Ambulatory Visit: Payer: Self-pay

## 2024-11-13 DIAGNOSIS — R3989 Other symptoms and signs involving the genitourinary system: Secondary | ICD-10-CM

## 2024-11-13 NOTE — Telephone Encounter (Signed)
 The patient called the office and stated that when she was seen on 11/5, Sam told her that if her lower abdominal pain continued, a referral to an OB/GYN would be sent. She reports that she is still experiencing pain and is requesting that the referral be submitted.

## 2024-11-13 NOTE — Telephone Encounter (Signed)
 Referral requested for [facility]. Status pending. Will update patient once referral has been sent.

## 2024-11-14 NOTE — Telephone Encounter (Signed)
 Spoke with patient and informed her the referral to OB/GYN has been approved and [facility] will reach out to her to get her scheduled. Patient voiced understanding.

## 2024-11-17 ENCOUNTER — Encounter

## 2024-11-18 ENCOUNTER — Encounter

## 2024-11-19 ENCOUNTER — Encounter

## 2024-11-21 ENCOUNTER — Encounter

## 2024-11-24 ENCOUNTER — Ambulatory Visit
Admission: RE | Admit: 2024-11-24 | Discharge: 2024-11-24 | Disposition: A | Discharge status: Home / Self Care | Source: Ambulatory Visit | Attending: Rheumatology | Admitting: Rheumatology

## 2024-11-24 ENCOUNTER — Encounter

## 2024-11-24 DIAGNOSIS — M3322 Polymyositis with myopathy: Secondary | ICD-10-CM | POA: Insufficient documentation

## 2024-11-24 MED ORDER — DIPHENHYDRAMINE HCL 25 MG PO CAPS
ORAL_CAPSULE | ORAL | Status: AC
  Filled 2024-11-24: qty 1

## 2024-11-24 MED ORDER — ACETAMINOPHEN 325 MG PO TABS
650.0000 mg | ORAL_TABLET | Freq: Once | ORAL | Status: AC
  Administered 2024-11-24: 650 mg via ORAL

## 2024-11-24 MED ORDER — DIPHENHYDRAMINE HCL 25 MG PO CAPS
25.0000 mg | ORAL_CAPSULE | Freq: Once | ORAL | Status: AC
  Administered 2024-11-24: 25 mg via ORAL

## 2024-11-24 MED ORDER — METHYLPREDNISOLONE SODIUM SUCC 40 MG IJ SOLR
10.0000 mg | Freq: Once | INTRAMUSCULAR | Status: AC
  Administered 2024-11-24: 10 mg via INTRAVENOUS

## 2024-11-24 MED ORDER — IMMUNE GLOBULIN (HUMAN) 20 GM/200ML IV SOLN
500.0000 mg/kg | Freq: Once | INTRAVENOUS | Status: AC
  Administered 2024-11-24: 40 g via INTRAVENOUS
  Filled 2024-11-24: qty 400

## 2024-11-24 MED ORDER — ACETAMINOPHEN 325 MG PO TABS
ORAL_TABLET | ORAL | Status: AC
  Filled 2024-11-24: qty 2

## 2024-11-24 MED ORDER — METHYLPREDNISOLONE SODIUM SUCC 40 MG IJ SOLR
INTRAMUSCULAR | Status: AC
  Filled 2024-11-24: qty 1

## 2024-11-25 ENCOUNTER — Ambulatory Visit
Admission: RE | Admit: 2024-11-25 | Discharge: 2024-11-25 | Disposition: A | Source: Ambulatory Visit | Attending: Rheumatology | Admitting: Rheumatology

## 2024-11-25 DIAGNOSIS — M3322 Polymyositis with myopathy: Secondary | ICD-10-CM | POA: Diagnosis not present

## 2024-11-25 DIAGNOSIS — M3320 Polymyositis, organ involvement unspecified: Secondary | ICD-10-CM | POA: Diagnosis present

## 2024-11-25 MED ORDER — ACETAMINOPHEN 325 MG PO TABS
650.0000 mg | ORAL_TABLET | Freq: Once | ORAL | Status: AC
  Administered 2024-11-25: 650 mg via ORAL

## 2024-11-25 MED ORDER — DIPHENHYDRAMINE HCL 25 MG PO CAPS
ORAL_CAPSULE | ORAL | Status: AC
  Filled 2024-11-25: qty 1

## 2024-11-25 MED ORDER — IMMUNE GLOBULIN (HUMAN) 20 GM/200ML IV SOLN
500.0000 mg/kg | Freq: Once | INTRAVENOUS | Status: AC
  Administered 2024-11-25: 40 g via INTRAVENOUS
  Filled 2024-11-25: qty 400

## 2024-11-25 MED ORDER — METHYLPREDNISOLONE SODIUM SUCC 40 MG IJ SOLR
INTRAMUSCULAR | Status: AC
  Filled 2024-11-25: qty 1

## 2024-11-25 MED ORDER — DIPHENHYDRAMINE HCL 25 MG PO CAPS
25.0000 mg | ORAL_CAPSULE | Freq: Once | ORAL | Status: AC
  Administered 2024-11-25: 25 mg via ORAL

## 2024-11-25 MED ORDER — METHYLPREDNISOLONE SODIUM SUCC 40 MG IJ SOLR
10.0000 mg | Freq: Once | INTRAMUSCULAR | Status: AC
  Administered 2024-11-25: 10 mg via INTRAVENOUS

## 2024-11-25 MED ORDER — ACETAMINOPHEN 325 MG PO TABS
ORAL_TABLET | ORAL | Status: AC
  Filled 2024-11-25: qty 2

## 2024-11-26 ENCOUNTER — Ambulatory Visit
Admission: RE | Admit: 2024-11-26 | Discharge: 2024-11-26 | Disposition: A | Source: Ambulatory Visit | Attending: Rheumatology | Admitting: Rheumatology

## 2024-11-26 DIAGNOSIS — M3322 Polymyositis with myopathy: Secondary | ICD-10-CM | POA: Insufficient documentation

## 2024-11-26 MED ORDER — ACETAMINOPHEN 325 MG PO TABS
ORAL_TABLET | ORAL | Status: AC
  Filled 2024-11-26: qty 2

## 2024-11-26 MED ORDER — ACETAMINOPHEN 325 MG PO TABS: 650.0000 mg | ORAL_TABLET | Freq: Once | ORAL | Status: DC

## 2024-11-26 MED ORDER — DIPHENHYDRAMINE HCL 25 MG PO CAPS: 25.0000 mg | ORAL_CAPSULE | Freq: Once | ORAL | Status: DC

## 2024-11-26 MED ORDER — IMMUNE GLOBULIN (HUMAN) 10 GM/100ML IV SOLN
40.0000 g | Freq: Once | INTRAVENOUS | Status: AC
  Administered 2024-11-26: 40 g via INTRAVENOUS
  Filled 2024-11-26: qty 400

## 2024-11-26 MED ORDER — IMMUNE GLOBULIN (HUMAN) 10 GM/100ML IV SOLN
40.0000 g | Freq: Once | INTRAVENOUS | Status: DC
  Filled 2024-11-26: qty 400

## 2024-11-26 MED ORDER — METHYLPREDNISOLONE SODIUM SUCC 40 MG IJ SOLR
INTRAMUSCULAR | Status: AC
  Filled 2024-11-26: qty 1

## 2024-11-26 MED ORDER — ACETAMINOPHEN 325 MG PO TABS
650.0000 mg | ORAL_TABLET | Freq: Once | ORAL | Status: AC
  Administered 2024-11-26: 650 mg via ORAL

## 2024-11-26 MED ORDER — DIPHENHYDRAMINE HCL 25 MG PO CAPS
ORAL_CAPSULE | ORAL | Status: AC
  Filled 2024-11-26: qty 1

## 2024-11-26 MED ORDER — DIPHENHYDRAMINE HCL 25 MG PO CAPS
25.0000 mg | ORAL_CAPSULE | Freq: Once | ORAL | Status: AC
  Administered 2024-11-26: 25 mg via ORAL

## 2024-11-26 MED ORDER — METHYLPREDNISOLONE SODIUM SUCC 40 MG IJ SOLR: 10.0000 mg | Freq: Once | INTRAMUSCULAR | Status: DC

## 2024-11-26 MED ORDER — METHYLPREDNISOLONE SODIUM SUCC 40 MG IJ SOLR
10.0000 mg | Freq: Once | INTRAMUSCULAR | Status: AC
  Administered 2024-11-26: 10 mg via INTRAVENOUS

## 2024-11-27 ENCOUNTER — Ambulatory Visit
Admission: RE | Admit: 2024-11-27 | Discharge: 2024-11-27 | Disposition: A | Source: Ambulatory Visit | Attending: Rheumatology | Admitting: Rheumatology

## 2024-11-27 ENCOUNTER — Other Ambulatory Visit (HOSPITAL_COMMUNITY): Payer: Self-pay | Admitting: Pharmacist

## 2024-11-27 DIAGNOSIS — M3322 Polymyositis with myopathy: Secondary | ICD-10-CM | POA: Diagnosis present

## 2024-11-27 MED ORDER — DIPHENHYDRAMINE HCL 25 MG PO CAPS
ORAL_CAPSULE | ORAL | Status: AC
  Filled 2024-11-27: qty 1

## 2024-11-27 MED ORDER — METHYLPREDNISOLONE SODIUM SUCC 40 MG IJ SOLR
10.0000 mg | Freq: Once | INTRAMUSCULAR | Status: AC
  Administered 2024-11-27: 10 mg via INTRAVENOUS

## 2024-11-27 MED ORDER — ACETAMINOPHEN 325 MG PO TABS
ORAL_TABLET | ORAL | Status: AC
  Filled 2024-11-27: qty 2

## 2024-11-27 MED ORDER — METHYLPREDNISOLONE SODIUM SUCC 40 MG IJ SOLR
INTRAMUSCULAR | Status: AC
  Filled 2024-11-27: qty 1

## 2024-11-27 MED ORDER — DIPHENHYDRAMINE HCL 25 MG PO CAPS
25.0000 mg | ORAL_CAPSULE | Freq: Once | ORAL | Status: AC
  Administered 2024-11-27: 25 mg via ORAL

## 2024-11-27 MED ORDER — ACETAMINOPHEN 325 MG PO TABS
650.0000 mg | ORAL_TABLET | Freq: Once | ORAL | Status: AC
  Administered 2024-11-27: 650 mg via ORAL

## 2024-11-27 MED ORDER — IMMUNE GLOBULIN (HUMAN) 10 GM/100ML IV SOLN
40.0000 g | Freq: Once | INTRAVENOUS | Status: AC
  Administered 2024-11-27: 40 g via INTRAVENOUS
  Filled 2024-11-27: qty 400

## 2024-11-28 ENCOUNTER — Telehealth (HOSPITAL_COMMUNITY): Payer: Self-pay

## 2024-11-28 NOTE — Telephone Encounter (Signed)
 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

## 2024-12-10 ENCOUNTER — Other Ambulatory Visit: Payer: Self-pay

## 2024-12-10 MED ORDER — METFORMIN HCL 1000 MG PO TABS: 1000.0000 mg | ORAL_TABLET | Freq: Two times a day (BID) | ORAL | 0 refills | Status: AC

## 2024-12-15 ENCOUNTER — Other Ambulatory Visit: Payer: Self-pay | Admitting: Nurse Practitioner

## 2025-01-05 ENCOUNTER — Ambulatory Visit: Payer: Self-pay

## 2025-01-05 ENCOUNTER — Ambulatory Visit

## 2025-01-05 VITALS — BP 132/86 | HR 78 | Temp 97.8°F | Ht 69.0 in | Wt 184.0 lb

## 2025-01-05 DIAGNOSIS — E7849 Other hyperlipidemia: Secondary | ICD-10-CM

## 2025-01-05 DIAGNOSIS — F5101 Primary insomnia: Secondary | ICD-10-CM | POA: Diagnosis not present

## 2025-01-05 DIAGNOSIS — Z7984 Long term (current) use of oral hypoglycemic drugs: Secondary | ICD-10-CM

## 2025-01-05 DIAGNOSIS — E559 Vitamin D deficiency, unspecified: Secondary | ICD-10-CM | POA: Diagnosis not present

## 2025-01-05 DIAGNOSIS — I872 Venous insufficiency (chronic) (peripheral): Secondary | ICD-10-CM | POA: Insufficient documentation

## 2025-01-05 DIAGNOSIS — Z23 Encounter for immunization: Secondary | ICD-10-CM

## 2025-01-05 DIAGNOSIS — E119 Type 2 diabetes mellitus without complications: Secondary | ICD-10-CM | POA: Diagnosis not present

## 2025-01-05 DIAGNOSIS — I10 Essential (primary) hypertension: Secondary | ICD-10-CM | POA: Diagnosis not present

## 2025-01-05 DIAGNOSIS — Z794 Long term (current) use of insulin: Secondary | ICD-10-CM | POA: Diagnosis not present

## 2025-01-05 LAB — POCT GLYCOSYLATED HEMOGLOBIN (HGB A1C): Hemoglobin A1C: 8 % — AB (ref 4.0–5.6)

## 2025-01-05 LAB — MICROALBUMIN / CREATININE URINE RATIO
Creatinine,U: 113.1 mg/dL
Microalb Creat Ratio: 16 mg/g (ref 0.0–30.0)
Microalb, Ur: 1.8 mg/dL (ref 0.7–1.9)

## 2025-01-05 MED ORDER — INSULIN GLARGINE 100 UNIT/ML ~~LOC~~ SOLN: SUBCUTANEOUS | 2 refills | Status: AC

## 2025-01-05 MED ORDER — FLUVASTATIN SODIUM ER 80 MG PO TB24: 80.0000 mg | ORAL_TABLET | Freq: Every evening | ORAL | 1 refills | Status: DC

## 2025-01-05 NOTE — Progress Notes (Signed)
 "  Subjective:   This visit was conducted in person. The patient gave informed consent to the use of Abridge AI technology to record the contents of the encounter as documented below.   Patient ID: Bridget Romero, female    DOB: July 14, 1946, 79 y.o.   MRN: [id number]   Bridget Romero is a very pleasant 79 y.o. female who presents today for DM visit.  Discussed the use of AI scribe software for clinical note transcription with the patient, who gave verbal consent to proceed.  History of Present Illness Bridget Romero is a 79 year old female with diabetes and polymyositis who presents for a follow-up visit.  She has diabetes and is currently taking metformin  one tablet twice a day. She is uncertain about her Lantus  dosing, mentioning she takes it in the morning and is unsure whether to administer 10 or 20 units. Her blood sugar was 132 this morning, and she typically administers 5 to 10 units if her sugar is over 140. She seeks clarification on the dosing scale.  She has a history of polymyositis and follows up with a rheumatologist. She is on prednisone  2.5 mg daily and methotrexate injections weekly, along with folic acid  1 mg daily. She denies receiving any other injections for polymyositis. She experiences abdominal discomfort at night when lying down in a certain way, but it resolves by morning. She also reports intermittent headaches and back pain associated with her condition.  She has a history of overactive bladder and recently saw a urologist who prescribed Sanctura . She is scheduled to follow up with her in a year.  She reports low B12 levels in the past and is currently taking daily iron  for anemia, as advised by her oncologist. She takes iron  65 mg daily, usually after breakfast, and acknowledges it may contribute to cramping. She also takes gabapentin three times daily for neuropathy and uses nystatin  powder as needed for skin issues under her breast.  She  occasionally uses temazepam  for sleep, about twice a week, and also takes melatonin 5 mg as needed. She takes vitamin D  daily and inquires about her B12 supplementation, which she believes is 1000 mcg daily.  She has her eyes checked annually for diabetic complications and has cataracts. She occasionally walks barefoot at home.   -Denies polydipsia, polyuria, weight loss, fatigue or any LE wounds.   Review of Systems  All other systems reviewed and are negative.        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 [employer]    Comment:    Tobacco Use   Smoking status: Former    Passive exposure: Never   Smokeless tobacco: Never   Tobacco comments:    age 72 when stopped smoking  Vaping Use   Vaping status: Never Used  Substance and Sexual Activity   Alcohol use: No   Drug use: No   Sexual activity: Not on file  Other Topics Concern  Not on file  Social History Narrative   has living will   Son and daughter should make health care decisions for her   Would accept resuscitation   Not sure about tube feeds--but not excited about this   Social Drivers of Health   Tobacco Use: Medium Risk (01/05/2025)   Patient History    Smoking Tobacco Use: Former    Smokeless Tobacco Use: Never    Passive Exposure: Never  Physicist, Medical Strain: Not on file  Food Insecurity: Not on file  Transportation Needs: Not on file  Physical Activity: Not on file  Stress: Not on file  Social Connections: Not on file  Intimate Partner Violence: Not on  file  Depression (PHQ2-9): Low Risk (01/05/2025)   Depression (PHQ2-9)    PHQ-2 Score: 0  Alcohol Screen: Not on file  Housing: Unknown (08/11/2024)   Received from [facility]   Epic    Unable to Pay for Housing in the Last Year: Not on file    Number of Times Moved in the Last Year: Not on file    At any time in the past 12 months, were you homeless or living in a shelter (including now)?: No  Utilities: Not on file  Health Literacy: Not on file    Past Surgical History:  Procedure Laterality Date   CESAREAN SECTION     COLONOSCOPY WITH PROPOFOL  N/A 03/07/2016   Procedure: COLONOSCOPY WITH PROPOFOL ;  Surgeon: Gladis RAYMOND Mariner, MD;  Location: [facility] ENDOSCOPY;  Service: Endoscopy;  Laterality: N/A;    Family History  Problem Relation Age of Onset   Heart disease Mother    Cancer Father    Breast cancer Neg Hx     Allergies[1]  Medications Ordered Prior to Encounter[2]  BP 132/86 (BP Location: Left Arm, Patient Position: Sitting, Cuff Size: Large)   Pulse 78   Temp 97.8 F (36.6 C) (Oral)   Ht 5' 9 (1.753 m)   Wt 184 lb (83.5 kg)   SpO2 100%   BMI 27.17 kg/m   Objective:     Physical Exam VITALS: BP- 132/86 GENERAL: Alert, cooperative, well developed, no acute distress. HEAD: Normocephalic atraumatic. EYES: Extraocular movements intact bilaterally, pupils round, equal and reactive to light bilaterally, conjunctivae normal bilaterally. EXTREMITIES: Mild bilateral edema, varicose veins present, good blood flow, no cyanosis. NEUROLOGICAL: Oriented to person, place and time, no gait abnormalities, moves all extremities without gross motor deficit, sensation intact except for decreased sensation on one spot on the bottom of the feet.     Physical Exam Vitals and nursing note reviewed.  Constitutional:      Appearance: Normal appearance.  Cardiovascular:     Pulses:          Dorsalis pedis pulses are 2+ on the right side and 2+ on the left side.        Posterior tibial pulses are 2+ on the right side and 2+ on the left side.  Feet:     Right foot:     Protective Sensation: 10 sites tested.  9 sites sensed.     Skin integrity: Skin integrity normal.     Toenail Condition: Right toenails are normal.     Left foot:     Protective Sensation: 10 sites tested.  9 sites sensed.     Skin integrity: Skin integrity normal.     Toenail Condition: Left toenails are normal.  Neurological:     Mental Status: She is alert.  Assessment & Plan:   Assessment and Plan Assessment & Plan Type 2 diabetes mellitus HbA1c increased to 8.0. Current regimen includes metformin  and Lantus . Goal HbA1c <7.5%. - Increase Lantus  to 10 units at night. - Check morning blood sugars before breakfast daily. - Call clinic with four days of morning blood sugar readings for Lantus  adjustment. - Continue metformin  1 tablet twice daily. - Order urine sample for diabetic nephropathy. - Schedule follow-up in three months.  Current A1c: 8 Last A1c: 7.9 Med regimen: Lantus , Metformin  1000mg  BID Urine microalbumin: Done Eye exam: UTD Foot exam: Done Ace-i/ARB: Yes Statin: Sent fluvastatin  Influenza and Pneumococcal vaccines: Prevnar this visit  Neuropathy: Yes Nephropathy: Yes Retinopathy: No   Essential hypertension Blood pressure well-controlled with lisinopril . - Continue lisinopril  10 mg daily.  Peripheral neuropathy Well controlled - Continue gabapentin 1 capsule three times daily.  Other hyperlipidemia Intolerance to statins due to muscle pain. Trial of fluvastatin  discussed, less likely to cause myopathy, she is agreeable to try. - Start fluvastatin  extended-release at night.  Vitamin D  deficiency Vitamin D  supplementation ongoing. Last level checked in 2010. - Order vitamin D  level. - Continue vitamin D  supplementation.  Chronic insomnia Discussed temazepam  risks. Recommended melatonin as safer alternative. - Discontinue  temazepam . - Use melatonin 5-10 mg as needed for sleep.  Chronic venous insufficiency with edema Mild bilateral edema, likely exacerbated by tight shoes and varicose veins. - Recommend wearing compression stockings. - Advise elevating legs when sitting or lying down.  General Health Maintenance Routine health maintenance discussed. Recent flu shot received. Pneumonia vaccination recommended. - Administer pneumonia vaccination. - Schedule physical exam in four weeks with fasting labs one week prior. - Order lipid panel and vitamin D  level.         No follow-ups on file.  Shereece Wellborn K Tiron Suski, MD  01/05/2025    Contains text generated by [employer].        [1]  Allergies Allergen Reactions   Atorvastatin  Other (See Comments)    myalgias   Pravastatin  Other (See Comments)    Muscle pain  [2]  Current Outpatient Medications on File Prior to Visit  Medication Sig Dispense Refill   Accu-Chek FastClix Lancets MISC USE TO TEST BLOOD SUGAR TWICE DAILY DX E11.40 102 each 11   ACCU-CHEK SMARTVIEW test strip USE AS DIRECTED TO CHECK BLOOD SUGAR TWICE DAILY 100 each 6   acetaminophen  (TYLENOL ) 500 MG tablet Take 500 mg by mouth every 6 (six) hours as needed.     Alcohol Swabs (B-D SINGLE USE SWABS REGULAR) PADS Use to test blood sugar once daily dx: 250.00 100 each 3   BD INSULIN  SYRINGE ULTRAFINE 31G X 5/16 0.5 ML MISC USE ONE SYRINGE DAILY TO INJECT LANTUS  UNDER THE SKIN. 100 each 4   BD SYRINGE SLIP TIP 25G X 5/8 1 ML MISC      Blood Glucose Calibration (ACCU-CHEK AVIVA) SOLN Use as directed 1 each 3   Cholecalciferol (VITAMIN D ) 1000 UNITS capsule Take 1,000 Units by mouth daily.     ferrous sulfate 325 (65 FE) MG tablet Take 325 mg by mouth daily with breakfast.     folic acid  (FOLVITE ) 1 MG tablet Take 1 mg by mouth daily.     gabapentin (NEURONTIN) 300 MG capsule Take 1 capsule by mouth 3 (three) times daily.     insulin  glargine (LANTUS ) 100 UNIT/ML injection INJECT  TEN TO 20 UNITS INTO THE SKIN DAILY AS DIRECTED (VIAL IS GOOD FOR 28 DAYS  AFTER FIRST USE) 10 mL 2   Insulin  Syringe-Needle U-100 31G X 5/16 1 ML MISC by Does not apply route.     lisinopril  (ZESTRIL ) 10 MG tablet Take 1 tablet (10 mg total) by mouth daily. 90 tablet 1   metFORMIN  (GLUCOPHAGE ) 1000 MG tablet Take 1 tablet (1,000 mg total) by mouth 2 (two) times daily. 180 tablet 0   methotrexate 50 MG/2ML injection      Multiple Vitamin (MULTIVITAMIN) tablet Take 1 tablet by mouth daily.     nystatin  (MYCOSTATIN /NYSTOP ) powder Apply 1 Application topically 3 (three) times daily as needed.     predniSONE  (DELTASONE ) 2.5 MG tablet Take 2.5 mg by mouth daily.     temazepam  (RESTORIL ) 15 MG capsule Take 1 capsule (15 mg total) by mouth at bedtime as needed for sleep. 30 capsule 0   trospium  (SANCTURA ) 20 MG tablet Take 1 tab 1 hour prior to bedtime 30 tablet 11   No current facility-administered medications on file prior to visit.   "

## 2025-01-05 NOTE — Patient Instructions (Addendum)
 Thank you for visiting [facility] today! Here's what we talked about: - START Taking Lantus  10 units at night, then every morning check sugar before breakfast, call clinic with 4 days of readings -Continue Metformin  as is - START Fluvastatin   - STOP Tempazepam, use Melatonin 5-10mg  as needed for sleep - Get compression stockings from pharmacy and raise legs daily at home

## 2025-01-06 ENCOUNTER — Ambulatory Visit
Admission: RE | Admit: 2025-01-06 | Discharge: 2025-01-06 | Disposition: A | Discharge status: Home / Self Care | Source: Ambulatory Visit | Attending: Rheumatology | Admitting: Rheumatology

## 2025-01-06 VITALS — BP 195/88 | HR 72 | Temp 97.7°F | Resp 16

## 2025-01-06 DIAGNOSIS — M3322 Polymyositis with myopathy: Secondary | ICD-10-CM | POA: Diagnosis present

## 2025-01-06 DIAGNOSIS — M3320 Polymyositis, organ involvement unspecified: Secondary | ICD-10-CM | POA: Diagnosis present

## 2025-01-06 MED ORDER — METHYLPREDNISOLONE SODIUM SUCC 125 MG IJ SOLR
40.0000 mg | Freq: Once | INTRAMUSCULAR | Status: AC
  Administered 2025-01-06: 40 mg via INTRAVENOUS

## 2025-01-06 MED ORDER — IMMUNE GLOBULIN (HUMAN) 40 GM/400ML IV SOLN
0.5000 g/kg | Freq: Once | INTRAVENOUS | Status: AC
  Administered 2025-01-06: 40 g via INTRAVENOUS
  Filled 2025-01-06: qty 400

## 2025-01-06 MED ORDER — ACETAMINOPHEN 325 MG PO TABS
650.0000 mg | ORAL_TABLET | Freq: Once | ORAL | Status: AC
  Administered 2025-01-06: 650 mg via ORAL

## 2025-01-06 MED ORDER — ACETAMINOPHEN 325 MG PO TABS
ORAL_TABLET | ORAL | Status: AC
  Filled 2025-01-06: qty 2

## 2025-01-06 MED ORDER — DIPHENHYDRAMINE HCL 25 MG PO CAPS
ORAL_CAPSULE | ORAL | Status: AC
  Filled 2025-01-06: qty 1

## 2025-01-06 MED ORDER — METHYLPREDNISOLONE SODIUM SUCC 40 MG IJ SOLR
INTRAMUSCULAR | Status: AC
  Filled 2025-01-06: qty 1

## 2025-01-06 MED ORDER — DEXTROSE 5 % IV SOLN: INTRAVENOUS | Status: DC

## 2025-01-06 MED ORDER — DIPHENHYDRAMINE HCL 25 MG PO CAPS
25.0000 mg | ORAL_CAPSULE | Freq: Once | ORAL | Status: AC
  Administered 2025-01-06: 25 mg via ORAL

## 2025-01-07 ENCOUNTER — Ambulatory Visit
Admission: RE | Admit: 2025-01-07 | Discharge: 2025-01-07 | Disposition: A | Discharge status: Home / Self Care | Source: Ambulatory Visit | Attending: Rheumatology | Admitting: Rheumatology

## 2025-01-07 VITALS — BP 176/74 | HR 71 | Temp 98.2°F | Resp 15

## 2025-01-07 DIAGNOSIS — M3322 Polymyositis with myopathy: Secondary | ICD-10-CM | POA: Diagnosis present

## 2025-01-07 DIAGNOSIS — M3320 Polymyositis, organ involvement unspecified: Secondary | ICD-10-CM

## 2025-01-07 MED ORDER — METHYLPREDNISOLONE SODIUM SUCC 125 MG IJ SOLR
40.0000 mg | Freq: Once | INTRAMUSCULAR | Status: AC
  Administered 2025-01-07: 40 mg via INTRAVENOUS
  Filled 2025-01-07: qty 2

## 2025-01-07 MED ORDER — DIPHENHYDRAMINE HCL 25 MG PO CAPS
25.0000 mg | ORAL_CAPSULE | Freq: Once | ORAL | Status: AC
  Administered 2025-01-07: 25 mg via ORAL

## 2025-01-07 MED ORDER — DIPHENHYDRAMINE HCL 25 MG PO CAPS
ORAL_CAPSULE | ORAL | Status: AC
  Filled 2025-01-07: qty 1

## 2025-01-07 MED ORDER — IMMUNE GLOBULIN (HUMAN) 20 GM/200ML IV SOLN
0.5000 g/kg | Freq: Once | INTRAVENOUS | Status: AC
  Administered 2025-01-07: 40 g via INTRAVENOUS
  Filled 2025-01-07: qty 400

## 2025-01-07 MED ORDER — DEXTROSE 5 % IV SOLN: INTRAVENOUS | Status: DC

## 2025-01-07 MED ORDER — ACETAMINOPHEN 325 MG PO TABS
650.0000 mg | ORAL_TABLET | Freq: Once | ORAL | Status: AC
  Administered 2025-01-07: 650 mg via ORAL

## 2025-01-07 MED ORDER — ACETAMINOPHEN 325 MG PO TABS
ORAL_TABLET | ORAL | Status: AC
  Filled 2025-01-07: qty 2

## 2025-01-07 MED ORDER — METHYLPREDNISOLONE SODIUM SUCC 40 MG IJ SOLR
INTRAMUSCULAR | Status: AC
  Filled 2025-01-07: qty 1

## 2025-01-08 ENCOUNTER — Ambulatory Visit
Admission: RE | Admit: 2025-01-08 | Discharge: 2025-01-08 | Disposition: A | Discharge status: Home / Self Care | Source: Ambulatory Visit | Attending: Rheumatology | Admitting: Rheumatology

## 2025-01-08 VITALS — BP 166/84 | HR 72 | Temp 97.4°F | Resp 15

## 2025-01-08 DIAGNOSIS — M3320 Polymyositis, organ involvement unspecified: Secondary | ICD-10-CM | POA: Diagnosis present

## 2025-01-08 DIAGNOSIS — M3322 Polymyositis with myopathy: Secondary | ICD-10-CM | POA: Insufficient documentation

## 2025-01-08 MED ORDER — DIPHENHYDRAMINE HCL 25 MG PO CAPS
ORAL_CAPSULE | ORAL | Status: AC
  Filled 2025-01-08: qty 1

## 2025-01-08 MED ORDER — ACETAMINOPHEN 325 MG PO TABS
650.0000 mg | ORAL_TABLET | Freq: Once | ORAL | Status: AC
  Administered 2025-01-08: 650 mg via ORAL

## 2025-01-08 MED ORDER — DIPHENHYDRAMINE HCL 25 MG PO CAPS
25.0000 mg | ORAL_CAPSULE | Freq: Once | ORAL | Status: AC
  Administered 2025-01-08: 25 mg via ORAL

## 2025-01-08 MED ORDER — IMMUNE GLOBULIN (HUMAN) 20 GM/200ML IV SOLN
0.5000 g/kg | Freq: Once | INTRAVENOUS | Status: AC
  Administered 2025-01-08: 40 g via INTRAVENOUS
  Filled 2025-01-08: qty 400

## 2025-01-08 MED ORDER — DEXTROSE 5 % IV SOLN: INTRAVENOUS | Status: DC

## 2025-01-08 MED ORDER — ACETAMINOPHEN 325 MG PO TABS
ORAL_TABLET | ORAL | Status: AC
  Filled 2025-01-08: qty 2

## 2025-01-08 MED ORDER — METHYLPREDNISOLONE SODIUM SUCC 40 MG IJ SOLR
INTRAMUSCULAR | Status: AC
  Filled 2025-01-08: qty 1

## 2025-01-08 MED ORDER — METHYLPREDNISOLONE SODIUM SUCC 125 MG IJ SOLR
40.0000 mg | Freq: Once | INTRAMUSCULAR | Status: AC
  Administered 2025-01-08: 40 mg via INTRAVENOUS

## 2025-01-09 ENCOUNTER — Telehealth: Payer: Self-pay

## 2025-01-09 ENCOUNTER — Ambulatory Visit: Admission: RE | Admit: 2025-01-09

## 2025-01-09 ENCOUNTER — Other Ambulatory Visit (HOSPITAL_COMMUNITY): Payer: Self-pay

## 2025-01-09 DIAGNOSIS — E7849 Other hyperlipidemia: Secondary | ICD-10-CM

## 2025-01-09 NOTE — Telephone Encounter (Signed)
 Pharmacy Patient Advocate Encounter   Received notification from [facility] KEY that prior authorization for Lescol  XR 80 ER tabs is required/requested.   Insurance verification completed.   The patient is insured through [place].   Per test claim: PA required; PA submitted to above mentioned insurance via Latent Key/confirmation #/EOC [id number] Status is pending

## 2025-01-09 NOTE — Telephone Encounter (Signed)
 Copied from CRM [id number]. Topic: Clinical - Lab/Test Results >> Jan 09, 2025  9:23 AM Winona R wrote: Pt calling in her Glucose level as requested by PROVIDER Wednesday 148 Thursday 111 Friday 106

## 2025-01-13 ENCOUNTER — Telehealth (HOSPITAL_COMMUNITY): Payer: Self-pay | Admitting: Rheumatology

## 2025-01-13 NOTE — Telephone Encounter (Signed)
 Patient referred to infusion pharmacy team for ambulatory infusion of Privigen .  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 The patient was previously receiving 500 mg/kg (40 g) daily for four consecutive days, repeating every six weeks. The provider would like to continue the same total dose of 160 g, divided over two days, repeating every six weeks. A therapy plan will be entered for 80 g daily for two consecutive days, repeating every six weeks.  Infusion appointments - Scheduling team will schedule patient as soon as possible.   Thank you,  Norton Blush, PharmD, [facility] Pharmacist [facility]

## 2025-01-17 NOTE — Telephone Encounter (Signed)
 Please call patient and update that I looked at her last glucose readings, which looks like they were improving, for now she should continue the Lantus  10 units nightly, does she have any more glucose readings she can give us  since then?

## 2025-01-19 NOTE — Telephone Encounter (Signed)
 Pharmacy Patient Advocate Encounter  Received notification from [employer] that Prior Authorization for fluvastatin  er 80mg  tablets has been DENIED.  Full denial letter will be uploaded to the media tab. See denial reason below.   PA #/Case ID/Reference #: [id number]

## 2025-01-19 NOTE — Telephone Encounter (Signed)
 Most recent readings 116, 128, 149.

## 2025-01-20 ENCOUNTER — Encounter: Payer: Self-pay | Admitting: Rheumatology

## 2025-01-21 ENCOUNTER — Other Ambulatory Visit (INDEPENDENT_AMBULATORY_CARE_PROVIDER_SITE_OTHER)

## 2025-01-21 DIAGNOSIS — E559 Vitamin D deficiency, unspecified: Secondary | ICD-10-CM | POA: Diagnosis not present

## 2025-01-21 DIAGNOSIS — E7849 Other hyperlipidemia: Secondary | ICD-10-CM

## 2025-01-21 LAB — LIPID PANEL
Cholesterol: 145 mg/dL (ref 28–200)
HDL: 51.8 mg/dL
LDL Cholesterol: 75 mg/dL (ref 10–99)
NonHDL: 93.51
Total CHOL/HDL Ratio: 3
Triglycerides: 92 mg/dL (ref 10.0–149.0)
VLDL: 18.4 mg/dL (ref 0.0–40.0)

## 2025-01-21 LAB — VITAMIN D 25 HYDROXY (VIT D DEFICIENCY, FRACTURES): VITD: 42.73 ng/mL (ref 30.00–100.00)

## 2025-01-21 MED ORDER — EZETIMIBE 10 MG PO TABS: 10.0000 mg | ORAL_TABLET | Freq: Every day | ORAL | 3 refills | Status: DC

## 2025-01-21 NOTE — Telephone Encounter (Signed)
 Please call patient and update to continue on Lantus  10 units for now.  We will see how her sugars are doing when I next see her in the clinic

## 2025-01-21 NOTE — Telephone Encounter (Signed)
Left message on VM per DPR. 

## 2025-01-21 NOTE — Telephone Encounter (Signed)
 Spoke to pt

## 2025-01-21 NOTE — Telephone Encounter (Signed)
 Please call patient and update that our application for fluvastatin  has been denied.  However, she still needs to be on cholesterol medication, I will send an alternative called ezetimibe , 1 tablet daily.  This medication is less likely to cause muscle aches than statins so most people do okay with it.  I have sent it to her pharmacy.

## 2025-01-21 NOTE — Addendum Note (Signed)
 Addended by: Jaleeya Mcnelly K on: 01/21/2025 03:04 PM   Modules accepted: Orders

## 2025-01-28 ENCOUNTER — Ambulatory Visit

## 2025-01-28 VITALS — BP 136/80 | HR 90 | Temp 98.0°F | Ht 69.0 in | Wt 183.0 lb

## 2025-01-28 DIAGNOSIS — Z0000 Encounter for general adult medical examination without abnormal findings: Secondary | ICD-10-CM

## 2025-01-28 DIAGNOSIS — E7849 Other hyperlipidemia: Secondary | ICD-10-CM

## 2025-01-28 DIAGNOSIS — Z780 Asymptomatic menopausal state: Secondary | ICD-10-CM

## 2025-01-28 MED ORDER — EZETIMIBE 10 MG PO TABS: 10.0000 mg | ORAL_TABLET | Freq: Every day | ORAL | 3 refills | Status: AC

## 2025-01-28 NOTE — Patient Instructions (Signed)
 Thank you for visiting [facility] today! Here's what we talked about: - START Zetia  - Get RSV, COVID vaccine from pharmacy - Sit with a lawyer and draft a Living will and power of attorney, once done bring a copy to clinic

## 2025-01-28 NOTE — Progress Notes (Signed)
 "  Assessment & Plan:   This visit was conducted in person. The patient gave informed consent to the use of Abridge AI technology to record the contents of the encounter as documented below.  Assessment & Plan Adult Wellness Visit Routine wellness visit with new recommendations discussed. Discussed colonoscopy and mammogram discontinuation due to age. Recommended bone density scan due to age and menopause-related bone density weakening.   - Ordered bone density scan at [facility]. - Recommended RSV vaccine from pharmacy. - Recommended COVID booster from pharmacy. - Encouraged exercise and healthy diet.   I have personally reviewed and have noted: 1. The patient's medical and social history 2. Their use of alcohol, tobacco or illicit drugs 3. Their current medications and supplements 4. The patient's functional ability including ADL's, fall risks, home safety risks and hearing or visual impairment. 5. Diet and physical activities 6. Evidence for depression or mood disorder   Colonoscopy: 2017 was WNL, dc'ing given age Mammogram: 2023 was WNL, agreeable to dc DEXA: 2014 was WNL, due, ordered Labs: UTD Immunizations: Get RSV, COVID vaccine from pharmacy Diet and Exercise: Lots of fruits and veg, resistance exercises daily  Sleep and mood: Ok Dental and vision: UTD ADLs and IADLs: Capable Cognitive: Intact to orientation, naming, 1/3 words recalled, not affecting ADLs Fall risk and home safety: No concerns Health counselling given: Healthy diet given DM  Advanced Directives Patient does not have advanced directives, educated, she will set up    Hyperlipidemia Counseled about importance of HLD medication with comorbid DM.  Management complicated by statin intolerance. Previous statins caused muscle aches. Fluvastatin  not approved by insurance.  She self discontinued Zetia , recommended she restart and she is agreeable. - Started Zetia  10 mg daily for cholesterol  management.    Follow-up: Return in about 1 year (around 01/28/2026) for CPE, fasting labs 1wk before. Diabetic visit upcoming in April.        Subjective:   Patient ID: Bridget Romero, female    DOB: 10-01-1946  Age: 78 y.o. MRN: [id number]  Patient Care Team: Bennett Reuben POUR, MD as PCP - General (Family Medicine) [name]., MD (Rheumatology) Rennie Cindy SAUNDERS, MD as Consulting Physician (Internal Medicine)   CC:  Chief Complaint  Patient presents with   Annual Exam    Bridget Romero is a 79 y.o. female here for annual physical and f/u on chronic conditions, see assessment and plan for further details  Discussed the use of AI scribe software for clinical note transcription with the patient, who gave verbal consent to proceed.  History of Present Illness Bridget Romero is a 79 year old female with diabetes and anemia who presents for a routine follow-up visit.  She has diabetes and reports muscle aches when taking statins, and has not been on Zetia  for a while. She has not been on Zetia  for a while.  She has a history of anemia, with a hemoglobin level of 9 as of November 2025. No blood in her stool and her stool is not black. She takes folic acid  and iron  supplements.  She has irritable bowel syndrome (IBS), which sometimes causes diarrhea. No abdominal pain and she reports regular bowel movements.  She does not consume alcohol and quit smoking at age 26. She exercises regularly, mostly seated exercises, and tries to do them daily. She eats fruits and vegetables, including cabbage and beets, and drinks plenty of water .  She occasionally uses melatonin for sleep and describes her mood  as generally good, though she sometimes feels down but manages to 'pick herself up'.  She has received her flu and pneumonia vaccines, is up to date on tetanus and shingles vaccines, but has not yet received the new COVID booster or RSV vaccine.   Depression  Screening;    01/05/2025    9:47 AM 11/04/2024   10:30 AM 07/31/2024    2:41 PM 07/01/2024    9:42 AM 04/25/2024    9:32 AM 01/02/2024    9:20 AM 12/21/2022    3:36 PM  PHQ 2/9 Scores  PHQ - 2 Score 0 1 0 0 0 1 0  PHQ- 9 Score    0         Data saved with a previous flowsheet row definition     Anxiety Screening:    07/01/2024    9:42 AM  GAD 7 : Generalized Anxiety Score  Nervous, Anxious, on Edge 0   Control/stop worrying 0   Worry too much - different things 0   Trouble relaxing 0   Restless 0   Easily annoyed or irritable 0   Afraid - awful might happen 0   Total GAD 7 Score 0  Anxiety Difficulty Not difficult at all     Data saved with a previous flowsheet row definition     ROS: Negative unless specifically indicated above in HPI.       Objective:     BP 136/80 (BP Location: Left Arm, Patient Position: Sitting, Cuff Size: Large)   Pulse 90   Temp 98 F (36.7 C) (Oral)   Ht 5' 9 (1.753 m)   Wt 183 lb (83 kg)   SpO2 100%   BMI 27.02 kg/m    Physical Exam GENERAL: Alert, cooperative, well developed, no acute distress. HEAD: Normocephalic atraumatic, oral cavity normal. EYES: Extraocular movements intact bilaterally, pupils round, equal and reactive to light bilaterally, conjunctivae normal bilaterally. EARS: Minimal cerumen in ears, tympanic membrane, ear canal and external ear normal bilaterally. NOSE: No congestion or rhinorrhea, mucous membranes are moist. THROAT: No oropharyngeal exudate or posterior oropharyngeal erythema, pharynx normal. CARDIOVASCULAR: Normal heart rate and rhythm, S1 and S2 normal without murmurs. CHEST: Clear to auscultation bilaterally, no wheezes, rhonchi, or crackles. ABDOMEN: Soft, non-tender, non-distended, without organomegaly, normal bowel sounds. EXTREMITIES: No cyanosis or edema. NEUROLOGICAL: Oriented to person, place and time, no gait abnormalities, moves all extremities without gross motor or sensory deficit, cranial  nerves grossly intact. NECK: Neck supple, no tenderness, thyroid  normal.        [name] K Robby Pirani, MD  01/28/25    Contains text generated by [employer]bridge AI Software.    "

## 2025-02-05 ENCOUNTER — Ambulatory Visit

## 2025-02-16 ENCOUNTER — Ambulatory Visit: Admission: RE | Admit: 2025-02-16

## 2025-02-17 ENCOUNTER — Ambulatory Visit

## 2025-02-18 ENCOUNTER — Ambulatory Visit

## 2025-03-02 ENCOUNTER — Other Ambulatory Visit

## 2025-04-06 ENCOUNTER — Ambulatory Visit

## 2025-05-04 ENCOUNTER — Inpatient Hospital Stay

## 2025-05-04 ENCOUNTER — Inpatient Hospital Stay: Admitting: Internal Medicine

## 2025-10-28 ENCOUNTER — Ambulatory Visit: Admitting: Physician Assistant

## 2026-01-22 ENCOUNTER — Other Ambulatory Visit

## 2026-01-29 ENCOUNTER — Encounter
